# Patient Record
Sex: Female | Born: 1970 | Race: White | Hispanic: No | Marital: Married | Smoking: Former smoker
Health system: Southern US, Community
[De-identification: ages and names within clinical notes are randomized; demographics above are authoritative.]

## PROBLEM LIST (undated history)

## (undated) DIAGNOSIS — E785 Hyperlipidemia, unspecified: Secondary | ICD-10-CM

## (undated) DIAGNOSIS — C801 Malignant (primary) neoplasm, unspecified: Secondary | ICD-10-CM

## (undated) DIAGNOSIS — C50919 Malignant neoplasm of unspecified site of unspecified female breast: Secondary | ICD-10-CM

## (undated) DIAGNOSIS — R011 Cardiac murmur, unspecified: Secondary | ICD-10-CM

## (undated) DIAGNOSIS — I10 Essential (primary) hypertension: Secondary | ICD-10-CM

## (undated) DIAGNOSIS — E6609 Other obesity due to excess calories: Secondary | ICD-10-CM

## (undated) DIAGNOSIS — F419 Anxiety disorder, unspecified: Secondary | ICD-10-CM

## (undated) DIAGNOSIS — R7303 Prediabetes: Secondary | ICD-10-CM

## (undated) DIAGNOSIS — R079 Chest pain, unspecified: Secondary | ICD-10-CM

## (undated) DIAGNOSIS — Z5189 Encounter for other specified aftercare: Secondary | ICD-10-CM

## (undated) DIAGNOSIS — F32A Depression, unspecified: Secondary | ICD-10-CM

## (undated) HISTORY — DX: Chest pain, unspecified: R07.9

## (undated) HISTORY — PX: COLONOSCOPY: SHX174

## (undated) HISTORY — DX: Other obesity due to excess calories: E66.09

## (undated) HISTORY — DX: Malignant neoplasm of unspecified site of unspecified female breast: C50.919

## (undated) HISTORY — DX: Encounter for other specified aftercare: Z51.89

## (undated) HISTORY — DX: Depression, unspecified: F32.A

## (undated) HISTORY — DX: Hyperlipidemia, unspecified: E78.5

## (undated) HISTORY — DX: Cardiac murmur, unspecified: R01.1

## (undated) HISTORY — DX: Anxiety disorder, unspecified: F41.9

## (undated) HISTORY — PX: WISDOM TOOTH EXTRACTION: SHX21

## (undated) HISTORY — PX: PORT-A-CATH REMOVAL: SHX5289

## (undated) HISTORY — PX: STOMACH SURGERY: SHX791

## (undated) MED FILL — Pertuzumab Soln for IV Infusion 420 MG/14ML (30 MG/ML): INTRAVENOUS | Qty: 14 | Status: AC

## (undated) MED FILL — Dexamethasone Sodium Phosphate Inj 100 MG/10ML: INTRAMUSCULAR | Qty: 1 | Status: AC

## (undated) MED FILL — Fosaprepitant Dimeglumine For IV Infusion 150 MG (Base Eq): INTRAVENOUS | Qty: 5 | Status: AC

## (undated) MED FILL — Fam-Trastuzumab Deruxtecan-nxki For IV Soln 100 MG: INTRAVENOUS | Qty: 17 | Status: AC

## (undated) MED FILL — Iron Sucrose Inj 20 MG/ML (Fe Equiv): INTRAVENOUS | Qty: 10 | Status: AC

## (undated) MED FILL — Trastuzumab-dkst For IV Soln 150 MG: INTRAVENOUS | Qty: 22 | Status: AC

## (undated) MED FILL — Docetaxel Soln for IV Infusion 160 MG/16ML: INTRAVENOUS | Qty: 11 | Status: AC

---

## 2003-08-25 ENCOUNTER — Ambulatory Visit (HOSPITAL_COMMUNITY): Admission: RE | Admit: 2003-08-25 | Discharge: 2003-08-25 | Payer: Self-pay | Admitting: Family Medicine

## 2003-10-15 ENCOUNTER — Ambulatory Visit (HOSPITAL_COMMUNITY): Admission: RE | Admit: 2003-10-15 | Discharge: 2003-10-15 | Payer: Self-pay | Admitting: Family Medicine

## 2010-09-02 ENCOUNTER — Encounter: Payer: Self-pay | Admitting: Family Medicine

## 2010-09-02 ENCOUNTER — Encounter: Payer: Self-pay | Admitting: *Deleted

## 2012-08-13 HISTORY — PX: OTHER SURGICAL HISTORY: SHX169

## 2013-07-23 DIAGNOSIS — C50412 Malignant neoplasm of upper-outer quadrant of left female breast: Secondary | ICD-10-CM | POA: Insufficient documentation

## 2013-07-23 HISTORY — DX: Malignant neoplasm of upper-outer quadrant of left female breast: C50.412

## 2013-08-28 HISTORY — PX: PORTACATH PLACEMENT: SHX2246

## 2015-07-14 DIAGNOSIS — C50912 Malignant neoplasm of unspecified site of left female breast: Secondary | ICD-10-CM

## 2015-07-14 DIAGNOSIS — Z170 Estrogen receptor positive status [ER+]: Secondary | ICD-10-CM

## 2015-07-14 DIAGNOSIS — Z79810 Long term (current) use of selective estrogen receptor modulators (SERMs): Secondary | ICD-10-CM

## 2016-03-12 DIAGNOSIS — C50912 Malignant neoplasm of unspecified site of left female breast: Secondary | ICD-10-CM | POA: Diagnosis not present

## 2016-12-17 DIAGNOSIS — C50912 Malignant neoplasm of unspecified site of left female breast: Secondary | ICD-10-CM | POA: Diagnosis not present

## 2016-12-17 DIAGNOSIS — Z170 Estrogen receptor positive status [ER+]: Secondary | ICD-10-CM | POA: Diagnosis not present

## 2017-12-18 DIAGNOSIS — C50912 Malignant neoplasm of unspecified site of left female breast: Secondary | ICD-10-CM | POA: Diagnosis not present

## 2017-12-18 DIAGNOSIS — Z9221 Personal history of antineoplastic chemotherapy: Secondary | ICD-10-CM | POA: Diagnosis not present

## 2017-12-18 DIAGNOSIS — Z79810 Long term (current) use of selective estrogen receptor modulators (SERMs): Secondary | ICD-10-CM | POA: Diagnosis not present

## 2017-12-18 DIAGNOSIS — Z923 Personal history of irradiation: Secondary | ICD-10-CM | POA: Diagnosis not present

## 2017-12-18 DIAGNOSIS — Z170 Estrogen receptor positive status [ER+]: Secondary | ICD-10-CM | POA: Diagnosis not present

## 2017-12-18 DIAGNOSIS — R945 Abnormal results of liver function studies: Secondary | ICD-10-CM | POA: Diagnosis not present

## 2018-04-22 DIAGNOSIS — M7752 Other enthesopathy of left foot: Secondary | ICD-10-CM

## 2018-04-22 DIAGNOSIS — M6702 Short Achilles tendon (acquired), left ankle: Secondary | ICD-10-CM

## 2018-04-22 DIAGNOSIS — M7662 Achilles tendinitis, left leg: Secondary | ICD-10-CM

## 2018-04-22 HISTORY — DX: Other enthesopathy of left foot and ankle: M77.52

## 2018-04-22 HISTORY — DX: Achilles tendinitis, left leg: M76.62

## 2018-04-22 HISTORY — DX: Short Achilles tendon (acquired), left ankle: M67.02

## 2019-01-12 HISTORY — PX: LIVER BIOPSY: SHX301

## 2019-02-03 ENCOUNTER — Other Ambulatory Visit (HOSPITAL_COMMUNITY): Payer: Self-pay | Admitting: Nurse Practitioner

## 2019-02-03 DIAGNOSIS — R748 Abnormal levels of other serum enzymes: Secondary | ICD-10-CM

## 2019-02-03 DIAGNOSIS — K760 Fatty (change of) liver, not elsewhere classified: Secondary | ICD-10-CM

## 2019-02-04 DIAGNOSIS — Z170 Estrogen receptor positive status [ER+]: Secondary | ICD-10-CM | POA: Diagnosis not present

## 2019-02-04 DIAGNOSIS — C50912 Malignant neoplasm of unspecified site of left female breast: Secondary | ICD-10-CM | POA: Diagnosis not present

## 2019-02-11 ENCOUNTER — Other Ambulatory Visit: Payer: Self-pay | Admitting: Radiology

## 2019-02-12 ENCOUNTER — Other Ambulatory Visit: Payer: Self-pay | Admitting: Radiology

## 2019-02-12 ENCOUNTER — Other Ambulatory Visit: Payer: Self-pay | Admitting: Student

## 2019-02-16 ENCOUNTER — Ambulatory Visit (HOSPITAL_COMMUNITY)
Admission: RE | Admit: 2019-02-16 | Discharge: 2019-02-16 | Disposition: A | Discharge status: Home / Self Care | Payer: BC Managed Care – PPO | Source: Ambulatory Visit | Attending: Nurse Practitioner | Admitting: Nurse Practitioner

## 2019-02-16 ENCOUNTER — Encounter (HOSPITAL_COMMUNITY): Payer: Self-pay

## 2019-02-16 ENCOUNTER — Other Ambulatory Visit: Payer: Self-pay

## 2019-02-16 DIAGNOSIS — K760 Fatty (change of) liver, not elsewhere classified: Secondary | ICD-10-CM

## 2019-02-16 DIAGNOSIS — Z833 Family history of diabetes mellitus: Secondary | ICD-10-CM | POA: Diagnosis not present

## 2019-02-16 DIAGNOSIS — I10 Essential (primary) hypertension: Secondary | ICD-10-CM | POA: Insufficient documentation

## 2019-02-16 DIAGNOSIS — K7581 Nonalcoholic steatohepatitis (NASH): Secondary | ICD-10-CM | POA: Diagnosis not present

## 2019-02-16 DIAGNOSIS — Z853 Personal history of malignant neoplasm of breast: Secondary | ICD-10-CM | POA: Diagnosis not present

## 2019-02-16 DIAGNOSIS — Z87891 Personal history of nicotine dependence: Secondary | ICD-10-CM | POA: Diagnosis not present

## 2019-02-16 DIAGNOSIS — R7303 Prediabetes: Secondary | ICD-10-CM | POA: Insufficient documentation

## 2019-02-16 DIAGNOSIS — R748 Abnormal levels of other serum enzymes: Secondary | ICD-10-CM

## 2019-02-16 DIAGNOSIS — Z79899 Other long term (current) drug therapy: Secondary | ICD-10-CM | POA: Diagnosis not present

## 2019-02-16 DIAGNOSIS — R7989 Other specified abnormal findings of blood chemistry: Secondary | ICD-10-CM | POA: Diagnosis present

## 2019-02-16 HISTORY — DX: Malignant (primary) neoplasm, unspecified: C80.1

## 2019-02-16 HISTORY — DX: Essential (primary) hypertension: I10

## 2019-02-16 HISTORY — DX: Prediabetes: R73.03

## 2019-02-16 LAB — CBC
HCT: 43.1 % (ref 36.0–46.0)
Hemoglobin: 13.6 g/dL (ref 12.0–15.0)
MCH: 28.1 pg (ref 26.0–34.0)
MCHC: 31.6 g/dL (ref 30.0–36.0)
MCV: 89 fL (ref 80.0–100.0)
Platelets: 212 10*3/uL (ref 150–400)
RBC: 4.84 MIL/uL (ref 3.87–5.11)
RDW: 14.6 % (ref 11.5–15.5)
WBC: 8.2 10*3/uL (ref 4.0–10.5)
nRBC: 0 % (ref 0.0–0.2)

## 2019-02-16 LAB — PROTIME-INR
INR: 1.1 (ref 0.8–1.2)
Prothrombin Time: 13.8 seconds (ref 11.4–15.2)

## 2019-02-16 MED ORDER — SODIUM CHLORIDE 0.9 % IV SOLN
INTRAVENOUS | Status: AC | PRN
  Administered 2019-02-16: 10 mL/h via INTRAVENOUS

## 2019-02-16 MED ORDER — SODIUM CHLORIDE 0.9 % IV SOLN: INTRAVENOUS | Status: DC

## 2019-02-16 MED ORDER — FENTANYL CITRATE (PF) 100 MCG/2ML IJ SOLN
INTRAMUSCULAR | Status: AC | PRN
  Administered 2019-02-16: 25 ug via INTRAVENOUS
  Administered 2019-02-16: 50 ug via INTRAVENOUS

## 2019-02-16 MED ORDER — MIDAZOLAM HCL 2 MG/2ML IJ SOLN
INTRAMUSCULAR | Status: AC | PRN
  Administered 2019-02-16: 0.5 mg via INTRAVENOUS
  Administered 2019-02-16: 1 mg via INTRAVENOUS

## 2019-02-16 MED ORDER — OXYCODONE HCL 5 MG PO TABS: 5.0000 mg | ORAL_TABLET | ORAL | Status: DC | PRN

## 2019-02-16 MED ORDER — GELATIN ABSORBABLE 12-7 MM EX MISC
CUTANEOUS | Status: AC
  Filled 2019-02-16: qty 1

## 2019-02-16 MED ORDER — FENTANYL CITRATE (PF) 100 MCG/2ML IJ SOLN
INTRAMUSCULAR | Status: AC
  Filled 2019-02-16: qty 2

## 2019-02-16 MED ORDER — LIDOCAINE HCL (PF) 1 % IJ SOLN
INTRAMUSCULAR | Status: AC
  Filled 2019-02-16: qty 30

## 2019-02-16 MED ORDER — MIDAZOLAM HCL 2 MG/2ML IJ SOLN
INTRAMUSCULAR | Status: AC
  Filled 2019-02-16: qty 2

## 2019-02-16 NOTE — Discharge Instructions (Signed)
Liver Biopsy, Care After These instructions give you information about how to care for yourself after your procedure. Your health care provider may also give you more specific instructions. If you have problems or questions, contact your health care provider. What can I expect after the procedure? After your procedure, it is common to have:  Pain and soreness in the area where the biopsy was done.  Bruising around the area where the biopsy was done.  Sleepiness and fatigue for 1-2 days. Follow these instructions at home: Medicines  Take over-the-counter and prescription medicines only as told by your health care provider.  If you were prescribed an antibiotic medicine, take it as told by your health care provider. Do not stop taking the antibiotic even if you start to feel better.  Do not take medicines such as aspirin and ibuprofen unless your health care provider tells you to take them. These medicines thin your blood and can increase the risk of bleeding.  If you are taking prescription pain medicine, take actions to prevent or treat constipation. Your health care provider may recommend that you: ? Drink enough fluid to keep your urine pale yellow. ? Eat foods that are high in fiber, such as fresh fruits and vegetables, whole grains, and beans. ? Limit foods that are high in fat and processed sugars, such as fried or sweet foods. ? Take an over-the-counter or prescription medicine for constipation. Incision care  Follow instructions from your health care provider about how to take care of your incision. Make sure you: ? Wash your hands with soap and water before you change your bandage (dressing). If soap and water are not available, use hand sanitizer. ? Change your dressing as told by your health care provider. ? Leave stitches (sutures), skin glue, or adhesive strips in place. These skin closures may need to stay in place for 2 weeks or longer. If adhesive strip edges start to  loosen and curl up, you may trim the loose edges. Do not remove adhesive strips completely unless your health care provider tells you to do that.  Check your incision area every day for signs of infection. Check for: ? Redness, swelling, or pain. ? Fluid or blood. ? Warmth. ? Pus or a bad smell.  Do not take baths, swim, or use a hot tub until your health care provider says it is okay to do so. Activity   Rest at home for 1-2 days, or as directed by your health care provider. ? Avoid sitting for a long time without moving. Get up to take short walks every 1-2 hours. This is important to improve blood flow and breathing. Ask for help if you feel weak or unsteady.  Return to your normal activities as told by your health care provider. Ask your health care provider what activities are safe for you.  Do not drive or use heavy machinery while taking prescription pain medicine.  Do not lift anything that is heavier than 10 lb (4.5 kg), or the limit that your health care provider tells you, until he or she says that it is safe.  Do not play contact sports for 2 weeks after the procedure. General instructions   Do not drink alcohol in the first week after the procedure.  Have someone stay with you for at least 24 hours after the procedure.  It is your responsibility to obtain your test results. Ask your health care provider, or the department that is doing the test: ? When will my  results be ready? ? How will I get my results? ? What are my treatment options? ? What other tests do I need? ? What are my next steps?  Keep all follow-up visits as told by your health care provider. This is important. Contact a health care provider if:  You have increased bleeding from an incision, resulting in more than a small spot of blood.  You have redness, swelling, or increasing pain in any incisions.  You notice a discharge or a bad smell coming from any of your incisions.  You have a fever or  chills. Get help right away if:  You develop swelling, bloating, or pain in your abdomen.  You become dizzy or faint.  You develop a rash.  You have nausea or you vomit.  You faint, or you have shortness of breath or difficulty breathing.  You develop chest pain.  You have problems with your speech or vision.  You have trouble with your balance or moving your arms or legs. Summary  After the liver biopsy, it is common to have pain, soreness, and bruising in the area, as well as sleepiness and fatigue.  Take over-the-counter and prescription medicines only as told by your health care provider.  Follow instructions from your health care provider about how to care for your incision. Check the incision area daily for signs of infection. This information is not intended to replace advice given to you by your health care provider. Make sure you discuss any questions you have with your health care provider. Document Released: 02/16/2005 Document Revised: 09/22/2018 Document Reviewed: 08/09/2017 Elsevier Patient Education  [employer]. Moderate Conscious Sedation, Adult, Care After These instructions provide you with information about caring for yourself after your procedure. Your health care provider may also give you more specific instructions. Your treatment has been planned according to current medical practices, but problems sometimes occur. Call your health care provider if you have any problems or questions after your procedure. What can I expect after the procedure? After your procedure, it is common:  To feel sleepy for several hours.  To feel clumsy and have poor balance for several hours.  To have poor judgment for several hours.  To vomit if you eat too soon. Follow these instructions at home: For at least 24 hours after the procedure:   Do not: ? Participate in activities where you could fall or become injured. ? Drive. ? Use heavy machinery. ? Drink  alcohol. ? Take sleeping pills or medicines that cause drowsiness. ? Make important decisions or sign legal documents. ? Take care of children on your own.  Rest. Eating and drinking  Follow the diet recommended by your health care provider.  If you vomit: ? Drink water, juice, or soup when you can drink without vomiting. ? Make sure you have little or no nausea before eating solid foods. General instructions  Have a responsible adult stay with you until you are awake and alert.  Take over-the-counter and prescription medicines only as told by your health care provider.  If you smoke, do not smoke without supervision.  Keep all follow-up visits as told by your health care provider. This is important. Contact a health care provider if:  You keep feeling nauseous or you keep vomiting.  You feel light-headed.  You develop a rash.  You have a fever. Get help right away if:  You have trouble breathing. This information is not intended to replace advice given to you by your health  care provider. Make sure you discuss any questions you have with your health care provider. Document Released: 05/20/2013 Document Revised: 07/12/2017 Document Reviewed: 11/19/2015 Elsevier Patient Education  2020 [employer].

## 2019-02-16 NOTE — H&P (Signed)
Chief Complaint: Patient was seen in consultation today for random liver core biopsy at the request of Drazek,Dawn  Referring Physician(s): Drazek,Dawn  Supervising Physician: Markus Daft  Patient Status: [facility] - Out-pt  History of Present Illness: JENNELLE PINKSTAFF is a 48 y.o. female   Hx Breast Ca 2014 Follow up with Oncology revealed elevated liver functions in May 2020 Repeat labs still with elevation Now scheduled for liver core biopsy per Roosevelt Locks NP   Past Medical History:  Diagnosis Date  . Cancer ([place])   . Hypertension   . Pre-diabetes     Past Surgical History:  Procedure Laterality Date  . lumpectomy  2014    Allergies: Patient has no known allergies.  Medications: Prior to Admission medications   Medication Sig Start Date End Date Taking? Authorizing Provider  ARIPiprazole (ABILIFY) 10 MG tablet Take 5 mg by mouth daily.   Yes [provider]  desvenlafaxine (PRISTIQ) 50 MG 24 hr tablet Take 50 mg by mouth every morning.   Yes [provider]  ibuprofen (ADVIL) 200 MG tablet Take 400 mg by mouth every 8 (eight) hours as needed (pain).   Yes [provider]  labetalol (NORMODYNE) 100 MG tablet Take 100 mg by mouth every morning.   Yes [provider]  topiramate (TOPAMAX) 50 MG tablet Take 50 mg by mouth daily.   Yes [provider]  valsartan-hydrochlorothiazide (DIOVAN-HCT) 160-12.5 MG tablet Take 1 tablet by mouth daily.   Yes [provider]     Family History  Problem Relation Age of Onset  . Diabetes Father     Social History   Socioeconomic History  . Marital status: Married    Spouse name: Not on file  . Number of children: Not on file  . Years of education: Not on file  . Highest education level: Not on file  Occupational History  . Not on file  Social Needs  . Financial resource strain: Not on file  . Food insecurity    Worry: Not on file    Inability: Not on file  .  Transportation needs    Medical: Not on file    Non-medical: Not on file  Tobacco Use  . Smoking status: Former Smoker    Types: Cigarettes    Quit date: 2004    Years since quitting: 16.5  . Smokeless tobacco: Never Used  Substance and Sexual Activity  . Alcohol use: Never    Frequency: Never  . Drug use: Never  . Sexual activity: Not on file  Lifestyle  . Physical activity    Days per week: Not on file    Minutes per session: Not on file  . Stress: Not on file  Relationships  . Social Herbalist on phone: Not on file    Gets together: Not on file    Attends religious service: Not on file    Active member of club or organization: Not on file    Attends meetings of clubs or organizations: Not on file    Relationship status: Not on file  Other Topics Concern  . Not on file  Social History Narrative  . Not on file     Review of Systems: A 12 point ROS discussed and pertinent positives are indicated in the HPI above.  All other systems are negative.  Review of Systems  Constitutional: Negative for activity change, fatigue and fever.  Respiratory: Negative for cough and shortness of breath.  Cardiovascular: Negative for chest pain.  Gastrointestinal: Negative for abdominal pain.  Musculoskeletal: Negative for back pain.  Neurological: Negative for weakness.  Psychiatric/Behavioral: Negative for behavioral problems and confusion.    Vital Signs: BP 112/73   Pulse 93   Temp 98.2 F (36.8 C) (Oral)   Resp 16   Ht 5' 2"  (1.575 m)   Wt 226 lb (102.5 kg)   LMP 08/18/2013   SpO2 100%   BMI 41.34 kg/m   Physical Exam Vitals signs reviewed.  Cardiovascular:     Rate and Rhythm: Normal rate and regular rhythm.     Heart sounds: Normal heart sounds.  Pulmonary:     Effort: Pulmonary effort is normal.     Breath sounds: Normal breath sounds.  Abdominal:     General: Bowel sounds are normal.  Musculoskeletal: Normal range of motion.  Skin:    General:  Skin is warm and dry.  Neurological:     Mental Status: She is alert and oriented to person, place, and time.  Psychiatric:        Mood and Affect: Mood normal.        Behavior: Behavior normal.        Thought Content: Thought content normal.        Judgment: Judgment normal.     Imaging: No results found.  Labs:  CBC: Recent Labs    02/16/19 0640  WBC 8.2  HGB 13.6  HCT 43.1  PLT 212    COAGS: No results for input(s): INR, APTT in the last 8760 hours.  BMP: No results for input(s): NA, K, CL, CO2, GLUCOSE, BUN, CALCIUM, CREATININE, GFRNONAA, GFRAA in the last 8760 hours.  Invalid input(s): CMP  LIVER FUNCTION TESTS: No results for input(s): BILITOT, AST, ALT, ALKPHOS, PROT, ALBUMIN in the last 8760 hours.  TUMOR MARKERS: No results for input(s): AFPTM, CEA, CA199, CHROMGRNA in the last 8760 hours.  Assessment and Plan:  Persistent Elevated liver functions Hx Breast Ca Scheduled for random liver biopsy Risks and benefits of random liver biopsy was discussed with the patient and/or patient's family including, but not limited to bleeding, infection, damage to adjacent structures or low yield requiring additional tests.  All of the questions were answered and there is agreement to proceed. Consent signed and in chart.   Thank you for this interesting consult.  I greatly enjoyed meeting Kadisha Goodine Cicalese and look forward to participating in their care.  A copy of this report was sent to the requesting provider on this date.  Electronically Signed: Lavonia Drafts, PA-C 02/16/2019, 7:10 AM   I spent a total of  30 Minutes   in face to face in clinical consultation, greater than 50% of which was counseling/coordinating care for liver core bx

## 2019-02-16 NOTE — Sedation Documentation (Signed)
Called to give report. Nurse unavailable, will call back

## 2019-02-16 NOTE — Procedures (Signed)
Interventional Radiology Procedure:   Indications: Elevated LFTS  Procedure: US guided liver biopsy  Findings: Increased echogenicity and 3 cores from left hepatic lobe  Complications: None     EBL: Less than 20 ml  Plan: Bedrest 3 hours.    Alix Stowers R. Anselm Pancoast, MD  Pager: [phone]

## 2019-06-04 DIAGNOSIS — C50912 Malignant neoplasm of unspecified site of left female breast: Secondary | ICD-10-CM | POA: Diagnosis not present

## 2019-06-04 DIAGNOSIS — Z170 Estrogen receptor positive status [ER+]: Secondary | ICD-10-CM | POA: Diagnosis not present

## 2020-03-22 ENCOUNTER — Other Ambulatory Visit: Payer: Self-pay | Admitting: Nurse Practitioner

## 2020-03-22 DIAGNOSIS — K7581 Nonalcoholic steatohepatitis (NASH): Secondary | ICD-10-CM

## 2020-03-31 ENCOUNTER — Ambulatory Visit
Admission: RE | Admit: 2020-03-31 | Discharge: 2020-03-31 | Disposition: A | Discharge status: Home / Self Care | Payer: BC Managed Care – PPO | Source: Ambulatory Visit | Attending: Nurse Practitioner | Admitting: Nurse Practitioner

## 2020-03-31 DIAGNOSIS — K7581 Nonalcoholic steatohepatitis (NASH): Secondary | ICD-10-CM

## 2020-06-01 ENCOUNTER — Other Ambulatory Visit: Payer: Self-pay | Admitting: Hematology and Oncology

## 2020-06-01 DIAGNOSIS — C170 Malignant neoplasm of duodenum: Secondary | ICD-10-CM | POA: Diagnosis not present

## 2020-06-01 DIAGNOSIS — C50412 Malignant neoplasm of upper-outer quadrant of left female breast: Secondary | ICD-10-CM | POA: Diagnosis not present

## 2020-06-01 LAB — BASIC METABOLIC PANEL
BUN: 19 (ref 4–21)
CO2: 25 — AB (ref 13–22)
Chloride: 103 (ref 99–108)
Creatinine: 1 (ref 0.5–1.1)
Glucose: 105
Potassium: 3.7 (ref 3.4–5.3)
Sodium: 139 (ref 137–147)

## 2020-06-01 LAB — CBC AND DIFFERENTIAL
HCT: 45 (ref 36–46)
Hemoglobin: 14.2 (ref 12.0–16.0)
Neutrophils Absolute: 6.96
Platelets: 244 (ref 150–399)
WBC: 11.6

## 2020-06-01 LAB — HEPATIC FUNCTION PANEL
ALT: 42 — AB (ref 7–35)
AST: 37 — AB (ref 13–35)
Alkaline Phosphatase: 111 (ref 25–125)
Bilirubin, Total: 0.5

## 2020-06-01 LAB — CBC: RBC: 5.46 — AB (ref 3.87–5.11)

## 2020-06-01 LAB — COMPREHENSIVE METABOLIC PANEL
Albumin: 4.6 (ref 3.5–5.0)
Calcium: 9.5 (ref 8.7–10.7)

## 2020-06-13 ENCOUNTER — Telehealth: Payer: Self-pay

## 2020-06-13 NOTE — Telephone Encounter (Signed)
Patient notified of the results  

## 2020-06-13 NOTE — Telephone Encounter (Signed)
Error. Belva Chimes, LPN

## 2020-06-13 NOTE — Telephone Encounter (Signed)
Pt notified- verbalized understanding.

## 2020-06-13 NOTE — Telephone Encounter (Signed)
-----   Message from Derwood Kaplan, MD sent at 06/13/2020  2:34 PM EDT ----- Regarding: call with results Tell her MRI shows large cyst but looks completely benign

## 2020-06-15 ENCOUNTER — Encounter: Payer: Self-pay | Admitting: Nurse Practitioner

## 2020-07-04 ENCOUNTER — Encounter: Payer: Self-pay | Admitting: Oncology

## 2020-07-04 DIAGNOSIS — Z780 Asymptomatic menopausal state: Secondary | ICD-10-CM | POA: Insufficient documentation

## 2020-07-04 DIAGNOSIS — M8580 Other specified disorders of bone density and structure, unspecified site: Secondary | ICD-10-CM

## 2020-07-04 DIAGNOSIS — K760 Fatty (change of) liver, not elsewhere classified: Secondary | ICD-10-CM

## 2020-07-04 DIAGNOSIS — N281 Cyst of kidney, acquired: Secondary | ICD-10-CM

## 2020-07-04 HISTORY — DX: Cyst of kidney, acquired: N28.1

## 2020-07-04 HISTORY — DX: Other specified disorders of bone density and structure, unspecified site: M85.80

## 2020-07-04 HISTORY — DX: Asymptomatic menopausal state: Z78.0

## 2020-07-04 HISTORY — DX: Fatty (change of) liver, not elsewhere classified: K76.0

## 2020-07-28 ENCOUNTER — Encounter: Payer: Self-pay | Admitting: Oncology

## 2020-10-25 ENCOUNTER — Encounter: Payer: Self-pay | Admitting: Hematology and Oncology

## 2020-10-25 ENCOUNTER — Other Ambulatory Visit: Payer: Self-pay | Admitting: Hematology and Oncology

## 2020-10-25 DIAGNOSIS — Z1231 Encounter for screening mammogram for malignant neoplasm of breast: Secondary | ICD-10-CM

## 2020-11-23 ENCOUNTER — Other Ambulatory Visit: Payer: Self-pay

## 2020-11-23 ENCOUNTER — Telehealth: Payer: Self-pay | Admitting: Hematology and Oncology

## 2020-11-23 ENCOUNTER — Inpatient Hospital Stay: Payer: BC Managed Care – PPO | Admitting: Hematology and Oncology

## 2020-11-23 ENCOUNTER — Encounter: Payer: Self-pay | Admitting: Hematology and Oncology

## 2020-11-23 ENCOUNTER — Inpatient Hospital Stay: Payer: BC Managed Care – PPO | Attending: Hematology and Oncology

## 2020-11-23 VITALS — BP 133/83 | HR 107 | Temp 98.5°F | Resp 20 | Ht 62.0 in | Wt 209.7 lb

## 2020-11-23 DIAGNOSIS — Z170 Estrogen receptor positive status [ER+]: Secondary | ICD-10-CM | POA: Diagnosis not present

## 2020-11-23 DIAGNOSIS — C50412 Malignant neoplasm of upper-outer quadrant of left female breast: Secondary | ICD-10-CM | POA: Diagnosis not present

## 2020-11-23 DIAGNOSIS — M7989 Other specified soft tissue disorders: Secondary | ICD-10-CM | POA: Diagnosis not present

## 2020-11-23 LAB — HEPATIC FUNCTION PANEL
ALT: 34 (ref 7–35)
AST: 29 (ref 13–35)
Alkaline Phosphatase: 91 (ref 25–125)
Bilirubin, Total: 0.4

## 2020-11-23 LAB — CBC AND DIFFERENTIAL
HCT: 43 (ref 36–46)
Hemoglobin: 13.7 (ref 12.0–16.0)
Neutrophils Absolute: 6.11
Platelets: 257 (ref 150–399)
WBC: 11.1

## 2020-11-23 LAB — COMPREHENSIVE METABOLIC PANEL
Albumin: 4.4 (ref 3.5–5.0)
Calcium: 8.9 (ref 8.7–10.7)

## 2020-11-23 LAB — BASIC METABOLIC PANEL
BUN: 17 (ref 4–21)
CO2: 24 — AB (ref 13–22)
Chloride: 108 (ref 99–108)
Creatinine: 0.8 (ref 0.5–1.1)
Glucose: 115
Potassium: 3.7 (ref 3.4–5.3)
Sodium: 141 (ref 137–147)

## 2020-11-23 LAB — CBC: RBC: 5.08 (ref 3.87–5.11)

## 2020-11-23 NOTE — Progress Notes (Signed)
[facility]  [address] [place],  [place]  [address] [phone]  Clinic Day:  11/23/2020  Referring physician: Stein, William J, PA-C   CHIEF COMPLAINT:  CC:  Stage II B hormone receptor positive breast cancer  Current Treatment:   Anastrozole 1 mg daily   HISTORY OF PRESENT ILLNESS:  Anna [name] is a 49 y.o. female with a stage IIB (T2 N1a M0) hormone receptor positive left breast cancer diagnosed in December 2014.  She was treated with lumpectomy.  Pathology revealed a 2.1 cm, grade 3, invasive ductal carcinoma with 1 lymph node positive for metastasis.  Lymphovascular invasion was seen.  Estrogen and progesterone receptors were positive and her 2 Neu negative.  Ki 67 was 76%.  She received adjuvant chemotherapy with 4 cycles of doxorubicin and cyclophosphamide, followed by 12 weeks of weekly paclitaxel.  She then received adjuvant radiation to the left breast.  She was placed on tamoxifen 20 mg daily in October 2015.  She underwent testing for hereditary cancer syndromes with [employer] myRisk Hereditary Cancer panel, which did not reveal any clinically significant mutation or variants of uncertain significance, so no change in management was recommended.  She was found to have fatty infiltration of the liver on a CTA chest in June 2015.  Bone density scan in November 2017 was normal.  Bilateral diagnostic mammogram in November 2018 did not reveal any evidence of malignancy.  When she was seen in May 2019, she had mild elevation of the AST, which was felt to likely be due to medication effects. She had a bilateral diagnostic mammogram in January 2020.  At that time, she reported a lump in the left breast, so an ultrasound was also done.  Bilateral mammogram and left breast ultrasound did not reveal any suspicious findings.  Postsurgical scarring was seen in the left breast.  The area of palpable abnormality in the left breast was overlying the  postsurgical scarring.  She was rescheduled in our office in May 2020 and found to have significant increase in her liver transaminases.  Tamoxifen was discontinued.  She was instructed to avoid alcohol and Tylenol.  CT abdomen and pelvis revealed severe hepatic steatosis.  Bone density scan in June 2020 revealed mild osteopenia with a T-score -1.4 in the spine.  Her last menstruation was in January 2015 and labs confirmed her postmenopausal status.  She was placed on anastrozole 1 mg daily in June 2020.  She was also referred to hepatology and saw Dawn Drazek, NP, who diagnosed the patient with nonalcoholic fatty liver disease.  She was unsure if this was secondary to tamoxifen, but felt it was unlikely.  Liver biopsy in July 2020 confirmed severe steatosis of 75-80% with moderate ballooning degeneration.  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  Ms. Drazek plans to monitor the patient every 6 months and will likely repeat a liver biopsy in 3 years.  The patient has had improvement in the liver transaminases.  Abdominal ultrasound per Dr. Drazek which revealed an enlarging exophytic cyst of the right kidney.  This has been appreciated for many years, on PET imaging from 2014 this measured 10.6 cm, CT in May 2020 this was 12.4 cm, and recent abdominal ultrasound from July measured this at 14.5 cm. MRI done in November confirmed a benign cyst of the right kidney.  She continues to see Dr. Drazek every 6 months.  She has   also been seen by dermatology at [facility], with a good report.   INTERVAL HISTORY:  Anna [name] is here today for repeat clinical assessment. She states she continues anastrozole daily without significant difficulty.  She has noted swelling of her left arm and pain in her left upper chest wall for several months.  She denies any changes in the breasts. She denies fevers or chills. She denies pain elsewhere. Her appetite is good. Her weight has  been stable.  She is not scheduled to have her bilateral mammogram until next week.  She has never had a colonoscopy, so we refer her to GI for screening colonoscopy.  REVIEW OF SYSTEMS:  Review of Systems  Constitutional: Negative for appetite change, chills, fatigue, fever and unexpected weight change.  HENT:   Negative for lump/mass, mouth sores and sore throat.   Respiratory: Negative for cough and shortness of breath.   Cardiovascular: Negative for chest pain and leg swelling.  Gastrointestinal: Negative for abdominal pain, constipation, diarrhea, nausea and vomiting.  Endocrine: Negative for hot flashes.  Genitourinary: Negative for difficulty urinating, dysuria, frequency, hematuria and vaginal bleeding.   Musculoskeletal: Negative for arthralgias, back pain and myalgias.  Skin: Negative for rash.  Neurological: Negative for dizziness and headaches.  Hematological: Negative for adenopathy. Does not bruise/bleed easily.  Psychiatric/Behavioral: Negative for depression and sleep disturbance. The patient is not nervous/anxious.      VITALS:  Blood pressure 133/83, pulse (!) 107, temperature 98.5 F (36.9 C), temperature source Oral, resp. rate 20, height 5' 2" (1.575 m), weight 209 lb 11.2 oz (95.1 kg), last menstrual period 08/18/2013, SpO2 94 %.  Wt Readings from Last 3 Encounters:  11/23/20 209 lb 11.2 oz (95.1 kg)  02/16/19 226 lb (102.5 kg)    Body mass index is 38.35 kg/m.  Performance status (ECOG): 1 - Symptomatic but completely ambulatory  PHYSICAL EXAM:  Physical Exam Vitals and nursing note reviewed.  Constitutional:      General: She is not in acute distress.    Appearance: Normal appearance.  HENT:     Head: Normocephalic and atraumatic.     Mouth/Throat:     Mouth: Mucous membranes are moist.     Pharynx: Oropharynx is clear. No oropharyngeal exudate or posterior oropharyngeal erythema.  Eyes:     General: No scleral icterus.    Extraocular Movements:  Extraocular movements intact.     Conjunctiva/sclera: Conjunctivae normal.     Pupils: Pupils are equal, round, and reactive to light.  Cardiovascular:     Rate and Rhythm: Normal rate and regular rhythm.     Heart sounds: Normal heart sounds. No murmur heard. No friction rub. No gallop.   Pulmonary:     Effort: Pulmonary effort is normal.     Breath sounds: Normal breath sounds. No wheezing, rhonchi or rales.  Chest:     Chest wall: Swelling (Left upper chest wall) present. No tenderness.  Breasts:     Right: Normal. No swelling, bleeding, inverted nipple, mass, nipple discharge, skin change, tenderness, axillary adenopathy or supraclavicular adenopathy.     Left: Normal. No swelling, bleeding, inverted nipple, mass, nipple discharge, skin change, tenderness, axillary adenopathy or supraclavicular adenopathy.    Abdominal:     General: There is no distension.     Palpations: Abdomen is soft. There is no hepatomegaly, splenomegaly or mass.     Tenderness: There is no abdominal tenderness.  Musculoskeletal:        General: Swelling (Left upper extremity) present. Normal  range of motion.     Cervical back: Normal range of motion and neck supple. No tenderness.     Right lower leg: No edema.     Left lower leg: No edema.  Lymphadenopathy:     Cervical: No cervical adenopathy.     Upper Body:     Right upper body: No supraclavicular or axillary adenopathy.     Left upper body: No supraclavicular or axillary adenopathy.     Lower Body: No right inguinal adenopathy. No left inguinal adenopathy.  Skin:    General: Skin is warm and dry.     Coloration: Skin is not jaundiced.     Findings: No rash.  Neurological:     Mental Status: She is alert and oriented to person, place, and time.     Cranial Nerves: No cranial nerve deficit.  Psychiatric:        Mood and Affect: Mood normal.        Behavior: Behavior normal.        Thought Content: Thought content normal.    LABS:   CBC  Latest Ref Rng & Units 11/23/2020 06/01/2020 02/16/2019  WBC - 11.1 11.6 8.2  Hemoglobin 12.0 - 16.0 13.7 14.2 13.6  Hematocrit 36 - 46 43 45 43.1  Platelets 150 - 399 257 244 212   CMP Latest Ref Rng & Units 11/23/2020 06/01/2020  BUN 4 - 21 17 19  Creatinine 0.5 - 1.1 0.8 1.0  Sodium 137 - 147 141 139  Potassium 3.4 - 5.3 3.7 3.7  Chloride 99 - 108 108 103  CO2 13 - 22 24(A) 25(A)  Calcium 8.7 - 10.7 8.9 9.5  Alkaline Phos 25 - 125 91 111  AST 13 - 35 29 37(A)  ALT 7 - 35 34 42(A)     No results found for: CEA1 / No results found for: CEA1 No results found for: PSA1 No results found for: CAN199 No results found for: CAN125  No results found for: TOTALPROTELP, ALBUMINELP, A1GS, A2GS, BETS, BETA2SER, GAMS, MSPIKE, SPEI No results found for: TIBC, FERRITIN, IRONPCTSAT No results found for: LDH  STUDIES:  No results found.    HISTORY:   Past Medical History:  Diagnosis Date  . Cancer (HCC)   . Hypertension   . Pre-diabetes     Past Surgical History:  Procedure Laterality Date  . lumpectomy  2014    Family History  Problem Relation Age of Onset  . Diabetes Father     Social History:  reports that she quit smoking about 18 years ago. Her smoking use included cigarettes. She has never used smokeless tobacco. She reports that she does not drink alcohol and does not use drugs.The patient is alone today.  Allergies: No Known Allergies  Current Medications: Current Outpatient Medications  Medication Sig Dispense Refill  . ARIPiprazole (ABILIFY) 10 MG tablet Take 5 mg by mouth daily.    . desvenlafaxine (PRISTIQ) 50 MG 24 hr tablet Take 50 mg by mouth every morning.    . ibuprofen (ADVIL) 200 MG tablet Take 400 mg by mouth every 8 (eight) hours as needed (pain).    . labetalol (NORMODYNE) 100 MG tablet Take 100 mg by mouth every morning.    . topiramate (TOPAMAX) 50 MG tablet Take 50 mg by mouth daily.    . valsartan-hydrochlorothiazide (DIOVAN-HCT) 160-12.5 MG tablet  Take 1 tablet by mouth daily.     No current facility-administered medications for this visit.     ASSESSMENT &   PLAN:   Assessment:  1. History of stage IIB hormone receptor positive breast cancer treated with surgery, chemotherapy and radiation therapy. She remains without evidence of recurrence over 6 years postop.  She was on adjuvant hormonal therapy with tamoxifen 20 mg daily, but due to elevation of the liver transaminases was switched to anastrazole.  2. Severe hepatic steatosis with elevation of the liver transaminases, nearly normalized now.  She continues to follow with hepatology, Dr. Drazek every 6 months who has recommended annual ultrasounds of the liver. 3. Borderline osteopenia of the spine.  She is on high-dose vitamin-D, but does not take calcium.  I advised her to take calcium daily.  She will be due for her next bone density scan in June 2022. 4. Exophytic cyst, present for many years which had slightly enlarged.   MRI confirmed this to be a benign cyst.  5. New lymphedema of the left upper extremity and left chest wall likely benign, but I would like to obtain a CT of the chest for further evaluation.  First, I will obtain a stat ultrasound of the left upper extremity to evaluate for deep venous thrombosis.   Plan:   The patient will proceed with mammogram next week. I will obtain a stat Venous Doppler ultrasound of the left upper extremity to evaluate for deep venous thrombosis.  If this is negative, I will proceed with a CT chest to further evaluate this swelling of the chest wall and plan to contact her with the results. If this is due to lymphedema, we will have her see the lymphedema specialist.  I will refer her to GI for screening colonoscopy.  We will plan to see her back in 6 months with a CBC and comprehensive metabolic panel for continued supportive care. The patient understands the plans discussed today and is in agreement with them.  She knows to contact our office  if she develops concerns prior to her next appointment.      A , PA-C       

## 2020-11-23 NOTE — Telephone Encounter (Signed)
Per 4/13 Staff Msg, patient scheduled for *STAT* VD U/S Left Upper Ext for today at 4:00 pm to check in at 3:30 pm  Gave patient orders

## 2020-11-28 ENCOUNTER — Telehealth: Payer: Self-pay

## 2020-11-28 NOTE — Telephone Encounter (Signed)
Pt has appt with Dr. Harriet Masson on 12/09/20. Faxed office notes to [place] on 11/28/20. Referral sent to scheduling.

## 2020-11-29 ENCOUNTER — Encounter: Payer: Self-pay | Admitting: *Deleted

## 2020-11-29 ENCOUNTER — Encounter: Payer: Self-pay | Admitting: Cardiology

## 2020-11-29 DIAGNOSIS — J4520 Mild intermittent asthma, uncomplicated: Secondary | ICD-10-CM

## 2020-11-29 DIAGNOSIS — F329 Major depressive disorder, single episode, unspecified: Secondary | ICD-10-CM | POA: Insufficient documentation

## 2020-11-29 DIAGNOSIS — F319 Bipolar disorder, unspecified: Secondary | ICD-10-CM

## 2020-11-29 DIAGNOSIS — Z0000 Encounter for general adult medical examination without abnormal findings: Secondary | ICD-10-CM | POA: Insufficient documentation

## 2020-11-29 DIAGNOSIS — D518 Other vitamin B12 deficiency anemias: Secondary | ICD-10-CM | POA: Insufficient documentation

## 2020-11-29 DIAGNOSIS — R5382 Chronic fatigue, unspecified: Secondary | ICD-10-CM | POA: Insufficient documentation

## 2020-11-29 DIAGNOSIS — F330 Major depressive disorder, recurrent, mild: Secondary | ICD-10-CM

## 2020-11-29 DIAGNOSIS — E039 Hypothyroidism, unspecified: Secondary | ICD-10-CM | POA: Insufficient documentation

## 2020-11-29 DIAGNOSIS — E559 Vitamin D deficiency, unspecified: Secondary | ICD-10-CM

## 2020-11-29 DIAGNOSIS — G4700 Insomnia, unspecified: Secondary | ICD-10-CM

## 2020-11-29 DIAGNOSIS — J019 Acute sinusitis, unspecified: Secondary | ICD-10-CM

## 2020-11-29 DIAGNOSIS — J302 Other seasonal allergic rhinitis: Secondary | ICD-10-CM

## 2020-11-29 DIAGNOSIS — K273 Acute peptic ulcer, site unspecified, without hemorrhage or perforation: Secondary | ICD-10-CM

## 2020-11-29 DIAGNOSIS — I10 Essential (primary) hypertension: Secondary | ICD-10-CM

## 2020-11-29 DIAGNOSIS — G9332 Myalgic encephalomyelitis/chronic fatigue syndrome: Secondary | ICD-10-CM

## 2020-11-29 DIAGNOSIS — E785 Hyperlipidemia, unspecified: Secondary | ICD-10-CM | POA: Insufficient documentation

## 2020-11-29 DIAGNOSIS — Z79899 Other long term (current) drug therapy: Secondary | ICD-10-CM

## 2020-11-29 DIAGNOSIS — E782 Mixed hyperlipidemia: Secondary | ICD-10-CM

## 2020-11-29 DIAGNOSIS — E119 Type 2 diabetes mellitus without complications: Secondary | ICD-10-CM

## 2020-11-29 DIAGNOSIS — G43909 Migraine, unspecified, not intractable, without status migrainosus: Secondary | ICD-10-CM | POA: Insufficient documentation

## 2020-11-29 DIAGNOSIS — Z853 Personal history of malignant neoplasm of breast: Secondary | ICD-10-CM

## 2020-11-29 HISTORY — DX: Migraine, unspecified, not intractable, without status migrainosus: G43.909

## 2020-11-29 HISTORY — DX: Personal history of malignant neoplasm of breast: Z85.3

## 2020-11-29 HISTORY — DX: Hypothyroidism, unspecified: E03.9

## 2020-11-29 HISTORY — DX: Acute sinusitis, unspecified: J01.90

## 2020-11-29 HISTORY — DX: Vitamin D deficiency, unspecified: E55.9

## 2020-11-29 HISTORY — DX: Mixed hyperlipidemia: E78.2

## 2020-11-29 HISTORY — DX: Bipolar disorder, unspecified: F31.9

## 2020-11-29 HISTORY — DX: Major depressive disorder, recurrent, mild: F33.0

## 2020-11-29 HISTORY — DX: Myalgic encephalomyelitis/chronic fatigue syndrome: G93.32

## 2020-11-29 HISTORY — DX: Other vitamin B12 deficiency anemias: D51.8

## 2020-11-29 HISTORY — DX: Insomnia, unspecified: G47.00

## 2020-11-29 HISTORY — DX: Acute peptic ulcer, site unspecified, without hemorrhage or perforation: K27.3

## 2020-11-29 HISTORY — DX: Essential (primary) hypertension: I10

## 2020-11-29 HISTORY — DX: Other seasonal allergic rhinitis: J30.2

## 2020-11-29 HISTORY — DX: Other long term (current) drug therapy: Z79.899

## 2020-11-29 HISTORY — DX: Type 2 diabetes mellitus without complications: E11.9

## 2020-11-29 HISTORY — DX: Chronic fatigue, unspecified: R53.82

## 2020-11-29 HISTORY — DX: Mild intermittent asthma, uncomplicated: J45.20

## 2020-11-30 ENCOUNTER — Other Ambulatory Visit: Payer: Self-pay | Admitting: Hematology and Oncology

## 2020-11-30 ENCOUNTER — Telehealth: Payer: Self-pay | Admitting: Hematology and Oncology

## 2020-11-30 DIAGNOSIS — R0789 Other chest pain: Secondary | ICD-10-CM

## 2020-11-30 DIAGNOSIS — Z170 Estrogen receptor positive status [ER+]: Secondary | ICD-10-CM

## 2020-11-30 NOTE — Progress Notes (Unsigned)
t

## 2020-11-30 NOTE — Telephone Encounter (Signed)
Per 4/20 Staff Msg, faxed order for X-Ray Chest to [facility] - Patient has Mammo tomorrow.  Requested X-Ray Chest to be added to Appt

## 2020-12-06 ENCOUNTER — Telehealth: Payer: Self-pay

## 2020-12-06 NOTE — Telephone Encounter (Signed)
-----   Message from [name], PA-C sent at 12/06/2020 10:35 AM EDT ----- Please tell her mammo was negative. I thought she was getting a CXR the same day. Is she going to do that soon? Thanks!

## 2020-12-08 ENCOUNTER — Telehealth: Payer: Self-pay

## 2020-12-08 NOTE — Telephone Encounter (Signed)
-----   Message from [name], PA-C sent at 12/06/2020 10:35 AM EDT ----- Please tell her mammo was negative. I thought she was getting a CXR the same day. Is she going to do that soon? Thanks!

## 2020-12-09 ENCOUNTER — Other Ambulatory Visit: Payer: Self-pay

## 2020-12-09 ENCOUNTER — Encounter: Payer: Self-pay | Admitting: Cardiology

## 2020-12-09 ENCOUNTER — Ambulatory Visit: Payer: BC Managed Care – PPO | Admitting: Cardiology

## 2020-12-09 ENCOUNTER — Telehealth: Payer: Self-pay

## 2020-12-09 VITALS — BP 138/80 | HR 119 | Ht 63.0 in | Wt 211.2 lb

## 2020-12-09 DIAGNOSIS — E119 Type 2 diabetes mellitus without complications: Secondary | ICD-10-CM

## 2020-12-09 DIAGNOSIS — R0789 Other chest pain: Secondary | ICD-10-CM | POA: Diagnosis not present

## 2020-12-09 DIAGNOSIS — E669 Obesity, unspecified: Secondary | ICD-10-CM

## 2020-12-09 DIAGNOSIS — R9431 Abnormal electrocardiogram [ECG] [EKG]: Secondary | ICD-10-CM | POA: Insufficient documentation

## 2020-12-09 DIAGNOSIS — R0602 Shortness of breath: Secondary | ICD-10-CM | POA: Diagnosis not present

## 2020-12-09 DIAGNOSIS — R000 Tachycardia, unspecified: Secondary | ICD-10-CM

## 2020-12-09 DIAGNOSIS — R011 Cardiac murmur, unspecified: Secondary | ICD-10-CM | POA: Insufficient documentation

## 2020-12-09 DIAGNOSIS — I10 Essential (primary) hypertension: Secondary | ICD-10-CM

## 2020-12-09 NOTE — Progress Notes (Signed)
Cardiology Office Note:    Date:  12/09/2020   ID:  Niki Payment Bolt, DOB 1971/05/31, MRN [id number]  PCP:  Bonnita Nasuti, MD  Cardiologist:  No primary care provider on file.  Electrophysiologist:  None   Referring MD: Laverle Hobby, NP   I was having shortness of breath and chest pain.  History of Present Illness:    Anna [name] is a 50 y.o. 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  The patient tells me that over the last several months she has been experiencing intermittent chest pain.  She notes that it is intermittent.  When it comes on is sometimes a burning sensation sometimes a stabbing sensation rated.  It last for few minutes and then resolved.  Nothing makes it better or worse.  She could be driving when she experienced this or she could be working when she experiences.  Over the last few weeks was been noticeable with the fact that she has been having shortness of breath on exertion.  She was able to do a lot of activities but recently she cannot without significant shortness of breath.  No other complaints at this time.  Past Medical History:  Diagnosis Date  . Achilles tendinitis of left lower extremity 04/22/2018  . Acute peptic ulcer without hemorrhage and without perforation 11/29/2020  . Acute sinusitis 11/29/2020  . Bipolar disorder ([place]) 11/29/2020  . Breast cancer ([place])   . Cancer ([place])   . Cardiac murmur   . Chest pain   . Chronic fatigue syndrome 11/29/2020  . Depression   . Essential hypertension 11/29/2020  . Hepatic steatosis determined by biopsy of liver 07/04/2020  . HLD (hyperlipidemia)   . Hypertension   . Hypothyroidism 11/29/2020  . Insomnia 11/29/2020  . Malignant neoplasm of upper-outer quadrant of left female breast ([place]) 07/23/2013  . Migraine 11/29/2020  .  Mild intermittent asthma 11/29/2020  . Mild recurrent major depression ([place]) 11/29/2020  . Mixed hyperlipidemia 11/29/2020  . Obesity due to excess calories   . Osteopenia after menopause 07/04/2020  . Other long term (current) drug therapy 11/29/2020  . Other vitamin B12 deficiency anemias 11/29/2020  . Personal history of malignant neoplasm of breast 11/29/2020  . Pre-diabetes   . Renal cyst, acquired, right 07/04/2020   14.5 cm in October 2021  . Retrocalcaneal bursitis (back of heel), left 04/22/2018  . Seasonal allergic rhinitis 11/29/2020  . Tightness of heel cord, left 04/22/2018  . Type 2 diabetes mellitus without complications ([place]) 0/10/4915  . Vitamin D deficiency 11/29/2020    Past Surgical History:  Procedure Laterality Date  . CESAREAN SECTION    . LIVER BIOPSY  01/2019  . lumpectomy  2014   Left Breast  . PORTACATH PLACEMENT  08/28/2013  . STOMACH SURGERY     Tummy Tuck    Current Medications: Current Meds  Medication Sig  . albuterol (VENTOLIN HFA) 108 (90 Base) MCG/ACT inhaler Inhale 2 puffs into the lungs every 6 (six) hours as needed for shortness of breath.  . ARIPiprazole (ABILIFY) 5 MG tablet Take 2.5 mg by mouth daily.  Marland Kitchen atorvastatin (LIPITOR) 20 MG tablet Take 1 tablet by mouth daily.  Marland Kitchen desvenlafaxine (PRISTIQ) 50 MG 24 hr tablet Take 1 tablet by mouth daily.  Marland Kitchen FARXIGA 10 MG TABS tablet Take 10 mg  by mouth daily.  . fluticasone (FLONASE) 50 MCG/ACT nasal spray Place 1 spray into both nostrils daily.  Marland Kitchen labetalol (NORMODYNE) 200 MG tablet Take 200 mg by mouth 2 (two) times daily.  . meloxicam (MOBIC) 15 MG tablet Take 1 tablet by mouth daily.  . nitroGLYCERIN (NITROSTAT) 0.4 MG SL tablet Place 0.4 mg under the tongue every 5 (five) minutes as needed for chest pain.  Marland Kitchen ondansetron (ZOFRAN) 4 MG tablet Take 4 mg by mouth as needed for nausea.  Marland Kitchen OZEMPIC, 0.25 OR 0.5 MG/DOSE, 2 MG/1.5ML SOPN Inject 0.5 mg into the skin once a week.  . topiramate (TOPAMAX) 50 MG  tablet Take 50 mg by mouth daily.  . Vitamin D, Ergocalciferol, (DRISDOL) 1.25 MG (50000 UNIT) CAPS capsule Take 50,000 Units by mouth 2 (two) times a week.  . [DISCONTINUED] phentermine (ADIPEX-P) 37.5 MG tablet Take 37.5 mg by mouth daily. Take 1.5 tablets daily     Allergies:   Patient has no known allergies.   Social History   Socioeconomic History  . Marital status: Married    Spouse name: Not on file  . Number of children: Not on file  . Years of education: Not on file  . Highest education level: Not on file  Occupational History  . Not on file  Tobacco Use  . Smoking status: Former Smoker    Types: Cigarettes    Quit date: 2004    Years since quitting: 18.3  . Smokeless tobacco: Never Used  Vaping Use  . Vaping Use: Never used  Substance and Sexual Activity  . Alcohol use: Never  . Drug use: Never  . Sexual activity: Not on file  Other Topics Concern  . Not on file  Social History Narrative  . Not on file   Social Determinants of Health   Financial Resource Strain: Not on file  Food Insecurity: Not on file  Transportation Needs: Not on file  Physical Activity: Not on file  Stress: Not on file  Social Connections: Not on file     Family History: The patient's family history includes Diabetes in her father; Hyperlipidemia in her mother; Stroke in her father.  ROS:   Review of Systems  Constitution: Negative for decreased appetite, fever and weight gain.  HENT: Negative for congestion, ear discharge, hoarse voice and sore throat.   Eyes: Negative for discharge, redness, vision loss in right eye and visual halos.  Cardiovascular: Negative for chest pain, dyspnea on exertion, leg swelling, orthopnea and palpitations.  Respiratory: Negative for cough, hemoptysis, shortness of breath and snoring.   Endocrine: Negative for heat intolerance and polyphagia.  Hematologic/Lymphatic: Negative for bleeding problem. Does not bruise/bleed easily.  Skin: Negative for  flushing, nail changes, rash and suspicious lesions.  Musculoskeletal: Negative for arthritis, joint pain, muscle cramps, myalgias, neck pain and stiffness.  Gastrointestinal: Negative for abdominal pain, bowel incontinence, diarrhea and excessive appetite.  Genitourinary: Negative for decreased libido, genital sores and incomplete emptying.  Neurological: Negative for brief paralysis, focal weakness, headaches and loss of balance.  Psychiatric/Behavioral: Negative for altered mental status, depression and suicidal ideas.  Allergic/Immunologic: Negative for HIV exposure and persistent infections.    EKGs/Labs/Other Studies Reviewed:    The following studies were reviewed today:   EKG:  The ekg ordered today demonstrates sinus tachycardia, heart rate 119 bpm.  Recent Labs: 11/23/2020: ALT 34; BUN 17; Creatinine 0.8; Hemoglobin 13.7; Platelets 257; Potassium 3.7; Sodium 141  Recent Lipid Panel No results found for: CHOL, TRIG,  HDL, CHOLHDL, VLDL, LDLCALC, LDLDIRECT  Physical Exam:    VS:  BP 138/80   Pulse (!) 119   Ht 5' 3"  (1.6 m)   Wt 211 lb 3.2 oz (95.8 kg)   LMP 08/18/2013   SpO2 96%   BMI 37.41 kg/m     Wt Readings from Last 3 Encounters:  12/09/20 211 lb 3.2 oz (95.8 kg)  11/24/20 209 lb (94.8 kg)  11/23/20 209 lb 11.2 oz (95.1 kg)     GEN: Well nourished, well developed in no acute distress HEENT: Normal NECK: No JVD; No carotid bruits LYMPHATICS: No lymphadenopathy CARDIAC: S1S2 noted,RRR, 2/6 systolic murmurs, rubs, gallops RESPIRATORY:  Clear to auscultation without rales, wheezing or rhonchi  ABDOMEN: Soft, non-tender, non-distended, +bowel sounds, no guarding. EXTREMITIES: No edema, No cyanosis, no clubbing MUSCULOSKELETAL:  No deformity  SKIN: Warm and dry NEUROLOGIC:  Alert and oriented x 3, non-focal PSYCHIATRIC:  Normal affect, good insight  ASSESSMENT:    1. SOB (shortness of breath)   2. Other chest pain   3. Murmur, cardiac   4. Type 2 diabetes  mellitus without complication, without long-term current use of insulin ([place])   5. Hypertension, unspecified type   6. Sinus tachycardia   7. Abnormal EKG   8. Obesity (BMI 30-39.9)    PLAN:     The symptoms chest pain and shortness of breath along with her diabetes and hypertension history are concerning, this patient does have intermediate risk for coronary artery disease and at this time I would like to pursue an ischemic evaluation in this patient.  Shared decision a coronary CTA at this time is appropriate.  I have discussed with the patient about the testing.  The patient has no IV contrast allergy and is agreeable to proceed with this test.  She has been given nitroglycerin by her PCP.  She has been prescribed labetalol but the patient does not take this medication as prescribed.  I have advised the patient today to please take this medicine as prescribed because we will like to understand her heart rate as well limited today to give her any recommendation on her heart rate and her beta-blocker as I do not understand how low her heart rate goes when she takes her labetalol.  In terms of her shortness of breath and murmur on exam, echocardiogram will also be done to assess LV/RV function and any other structure abnormalities.    Her blood pressure slightly elevated in the office today.  Talk to the patient given her diabetes she should really be less than 130/80.  She will take her labetalol as prescribed.  This is being managed by his primary care doctor.  No adjustments for antidiabetic medications were made today.  The patient understands the need to lose weight with diet and exercise. We have discussed specific strategies for this.  The patient is in agreement with the above plan. The patient left the office in stable condition.  The patient will follow up in 3 months   Medication Adjustments/Labs and Tests Ordered: Current medicines are reviewed at length with the patient today.   Concerns regarding medicines are outlined above.  Orders Placed This Encounter  Procedures  . CT CORONARY MORPH W/CTA COR W/SCORE W/CA W/CM &/OR WO/CM  . Basic metabolic panel  . Magnesium  . EKG 12-Lead  . ECHOCARDIOGRAM COMPLETE   No orders of the defined types were placed in this encounter.   Patient Instructions   Medication Instructions:  Your physician  recommends that you continue on your current medications as directed. Please refer to the Current Medication list given to you today. For the next few weeks please take Labetalol twice daily as prescribed and log your blood pressure and heart rate.  *If you need a refill on your cardiac medications before your next appointment, please call your pharmacy*   Lab Work: Your physician recommends that you return for lab work: 3-7 days before CT Scan: BMET, Mag If you have labs (blood work) drawn today and your tests are completely normal, you will receive your results only by: Marland Kitchen MyChart Message (if you have MyChart) OR . A paper copy in the mail If you have any lab test that is abnormal or we need to change your treatment, we will call you to review the results.   Testing/Procedures: Your cardiac CT will be scheduled:   [facility] [address] [place], [place] [address] [phone]  If scheduled at [facility], please arrive at the [facility] main entrance (entrance A) of [facility] 30 minutes prior to test start time. Proceed to the [facility] Radiology Department (first floor) to check-in and test prep.  Please follow these instructions carefully (unless otherwise directed):    On the Night Before the Test: . Be sure to Drink plenty of water. . Do not consume any caffeinated/decaffeinated beverages or chocolate 12 hours prior to your test. . Do not take any antihistamines 12 hours prior to your test.   On the Day of the Test: . Drink plenty of water until 1 hour prior to the  test. . Do not eat any food 4 hours prior to the test. . You may take your regular medications prior to the test.  . FEMALES- please wear underwire-free bra if available      After the Test: . Drink plenty of water. . After receiving IV contrast, you may experience a mild flushed feeling. This is normal. . On occasion, you may experience a mild rash up to 24 hours after the test. This is not dangerous. If this occurs, you can take Benadryl 25 mg and increase your fluid intake. . If you experience trouble breathing, this can be serious. If it is severe call 911 IMMEDIATELY. If it is mild, please call our office.   Once we have confirmed authorization from your insurance company, we will call you to set up a date and time for your test. Based on how quickly your insurance processes prior authorizations requests, please allow up to 4 weeks to be contacted for scheduling your Cardiac CT appointment. Be advised that routine Cardiac CT appointments could be scheduled as many as 8 weeks after your provider has ordered it.  For non-scheduling related questions, please contact the cardiac imaging nurse navigator should you have any questions/concerns: Marchia Bond, Cardiac Imaging Nurse Navigator Gordy Clement, Cardiac Imaging Nurse Navigator [facility] Direct Office Dial: [phone]   For scheduling needs, including cancellations and rescheduling, please call [place], [phone].   Your physician has requested that you have an echocardiogram. Echocardiography is a painless test that uses sound waves to create images of your heart. It provides your doctor with information about the size and shape of your heart and how well your heart's chambers and valves are working. This procedure takes approximately one hour. There are no restrictions for this procedure.  Follow-Up: At [facility], you and your health needs are our priority.  As part of  our continuing mission  to provide you with exceptional heart care, we have created designated Provider Care Teams.  These Care Teams include your primary Cardiologist (physician) and Advanced Practice Providers (APPs -  Physician Assistants and Nurse Practitioners) who all work together to provide you with the care you need, when you need it.  We recommend signing up for the patient portal called "MyChart".  Sign up information is provided on this After Visit Summary.  MyChart is used to connect with patients for Virtual Visits (Telemedicine).  Patients are able to view lab/test results, encounter notes, upcoming appointments, etc.  Non-urgent messages can be sent to your provider as well.   To learn more about what you can do with MyChart, go to [email or web address].    Your next appointment:   3 month(s)  The format for your next appointment:   In Person  Provider:   Berniece Salines, DO   Other Instructions  Echocardiogram An echocardiogram is a test that uses sound waves (ultrasound) to produce images of the heart. Images from an echocardiogram can provide important information about:  Heart size and shape.  The size and thickness and movement of your heart's walls.  Heart muscle function and strength.  Heart valve function or if you have stenosis. Stenosis is when the heart valves are too narrow.  If blood is flowing backward through the heart valves (regurgitation).  A tumor or infectious growth around the heart valves.  Areas of heart muscle that are not working well because of poor blood flow or injury from a heart attack.  Aneurysm detection. An aneurysm is a weak or damaged part of an artery wall. The wall bulges out from the normal force of blood pumping through the body. Tell a health care provider about:  Any allergies you have.  All medicines you are taking, including vitamins, herbs, eye drops, creams, and over-the-counter medicines.  Any blood disorders you have.  Any surgeries you  have had.  Any medical conditions you have.  Whether you are pregnant or may be pregnant. What are the risks? Generally, this is a safe test. However, problems may occur, including an allergic reaction to dye (contrast) that may be used during the test. What happens before the test? No specific preparation is needed. You may eat and drink normally. What happens during the test?  You will take off your clothes from the waist up and put on a hospital gown.  Electrodes or electrocardiogram (ECG)patches may be placed on your chest. The electrodes or patches are then connected to a device that monitors your heart rate and rhythm.  You will lie down on a table for an ultrasound exam. A gel will be applied to your chest to help sound waves pass through your skin.  A handheld device, called a transducer, will be pressed against your chest and moved over your heart. The transducer produces sound waves that travel to your heart and bounce back (or "echo" back) to the transducer. These sound waves will be captured in real-time and changed into images of your heart that can be viewed on a video monitor. The images will be recorded on a computer and reviewed by your health care provider.  You may be asked to change positions or hold your breath for a short time. This makes it easier to get different views or better views of your heart.  In some cases, you may receive contrast through an IV in one of your veins. This can improve the quality  of the pictures from your heart. The procedure may vary among health care providers and hospitals.   What can I expect after the test? You may return to your normal, everyday life, including diet, activities, and medicines, unless your health care provider tells you not to do that. Follow these instructions at home:  It is up to you to get the results of your test. Ask your health care provider, or the department that is doing the test, when your results will be  ready.  Keep all follow-up visits. This is important. Summary  An echocardiogram is a test that uses sound waves (ultrasound) to produce images of the heart.  Images from an echocardiogram can provide important information about the size and shape of your heart, heart muscle function, heart valve function, and other possible heart problems.  You do not need to do anything to prepare before this test. You may eat and drink normally.  After the echocardiogram is completed, you may return to your normal, everyday life, unless your health care provider tells you not to do that. This information is not intended to replace advice given to you by your health care provider. Make sure you discuss any questions you have with your health care provider. Document Revised: 03/22/2020 Document Reviewed: 03/22/2020 Elsevier Patient Education  2021 [place].      Adopting a Healthy Lifestyle.  Know what a healthy weight is for you (roughly BMI <25) and aim to maintain this   Aim for 7+ servings of fruits and vegetables daily   65-80+ fluid ounces of water or unsweet tea for healthy kidneys   Limit to max 1 drink of alcohol per day; avoid smoking/tobacco   Limit animal fats in diet for cholesterol and heart health - choose grass fed whenever available   Avoid highly processed foods, and foods high in saturated/trans fats   Aim for low stress - take time to unwind and care for your mental health   Aim for 150 min of moderate intensity exercise weekly for heart health, and weights twice weekly for bone health   Aim for 7-9 hours of sleep daily   When it comes to diets, agreement about the perfect plan isnt easy to find, even among the experts. Experts at the [employer] developed an idea known as the Healthy Eating Plate. Just imagine a plate divided into logical, healthy portions.   The emphasis is on diet quality:   Load up on vegetables and fruits - one-half of  your plate: Aim for color and variety, and remember that potatoes dont count.   Go for whole grains - one-quarter of your plate: Whole wheat, barley, wheat berries, quinoa, oats, brown rice, and foods made with them. If you want pasta, go with whole wheat pasta.   Protein power - one-quarter of your plate: Fish, chicken, beans, and nuts are all healthy, versatile protein sources. Limit red meat.   The diet, however, does go beyond the plate, offering a few other suggestions.   Use healthy plant oils, such as olive, canola, soy, corn, sunflower and peanut. Check the labels, and avoid partially hydrogenated oil, which have unhealthy trans fats.   If youre thirsty, drink water. Coffee and tea are good in moderation, but skip sugary drinks and limit milk and dairy products to one or two daily servings.   The type of carbohydrate in the diet is more important than the amount. Some sources of carbohydrates, such as vegetables, fruits, whole  grains, and beans-are healthier than others.   Finally, stay active  Signed, Berniece Salines, DO  12/09/2020 4:25 PM    [facility]

## 2020-12-09 NOTE — Patient Instructions (Addendum)
Medication Instructions:  Your physician recommends that you continue on your current medications as directed. Please refer to the Current Medication list given to you today. For the next few weeks please take Labetalol twice daily as prescribed and log your blood pressure and heart rate.  *If you need a refill on your cardiac medications before your next appointment, please call your pharmacy*   Lab Work: Your physician recommends that you return for lab work: 3-7 days before CT Scan: BMET, Mag If you have labs (blood work) drawn today and your tests are completely normal, you will receive your results only by: Marland Kitchen MyChart Message (if you have MyChart) OR . A paper copy in the mail If you have any lab test that is abnormal or we need to change your treatment, we will call you to review the results.   Testing/Procedures: Your cardiac CT will be scheduled:   [employer] [address] [place], [place] [address] [phone]  If scheduled at [facility], please arrive at the [facility] main entrance (entrance A) of [facility] 30 minutes prior to test start time. Proceed to the [facility] Radiology Department (first floor) to check-in and test prep.  Please follow these instructions carefully (unless otherwise directed):    On the Night Before the Test: . Be sure to Drink plenty of water. . Do not consume any caffeinated/decaffeinated beverages or chocolate 12 hours prior to your test. . Do not take any antihistamines 12 hours prior to your test.   On the Day of the Test: . Drink plenty of water until 1 hour prior to the test. . Do not eat any food 4 hours prior to the test. . You may take your regular medications prior to the test.  . FEMALES- please wear underwire-free bra if available      After the Test: . Drink plenty of water. . After receiving IV contrast, you may experience a mild flushed feeling. This is normal. . On occasion, you may  experience a mild rash up to 24 hours after the test. This is not dangerous. If this occurs, you can take Benadryl 25 mg and increase your fluid intake. . If you experience trouble breathing, this can be serious. If it is severe call 911 IMMEDIATELY. If it is mild, please call our office.   Once we have confirmed authorization from your insurance company, we will call you to set up a date and time for your test. Based on how quickly your insurance processes prior authorizations requests, please allow up to 4 weeks to be contacted for scheduling your Cardiac CT appointment. Be advised that routine Cardiac CT appointments could be scheduled as many as 8 weeks after your provider has ordered it.  For non-scheduling related questions, please contact the cardiac imaging nurse navigator should you have any questions/concerns: Marchia Bond, Cardiac Imaging Nurse Navigator Gordy Clement, Cardiac Imaging Nurse Navigator [facility] Direct Office Dial: [phone]   For scheduling needs, including cancellations and rescheduling, please call [place], [phone].   Your physician has requested that you have an echocardiogram. Echocardiography is a painless test that uses sound waves to create images of your heart. It provides your doctor with information about the size and shape of your heart and how well your heart's chambers and valves are working. This procedure takes approximately one hour. There are no restrictions for this procedure.  Follow-Up: At [facility], you and your health needs are our  priority.  As part of our continuing mission to provide you with exceptional heart care, we have created designated Provider Care Teams.  These Care Teams include your primary Cardiologist (physician) and Advanced Practice Providers (APPs -  Physician Assistants and Nurse Practitioners) who all work together to provide you with the care you need, when you need it.  We recommend  signing up for the patient portal called "MyChart".  Sign up information is provided on this After Visit Summary.  MyChart is used to connect with patients for Virtual Visits (Telemedicine).  Patients are able to view lab/test results, encounter notes, upcoming appointments, etc.  Non-urgent messages can be sent to your provider as well.   To learn more about what you can do with MyChart, go to [email or web address].    Your next appointment:   3 month(s)  The format for your next appointment:   In Person  Provider:   Berniece Salines, DO   Other Instructions  Echocardiogram An echocardiogram is a test that uses sound waves (ultrasound) to produce images of the heart. Images from an echocardiogram can provide important information about:  Heart size and shape.  The size and thickness and movement of your heart's walls.  Heart muscle function and strength.  Heart valve function or if you have stenosis. Stenosis is when the heart valves are too narrow.  If blood is flowing backward through the heart valves (regurgitation).  A tumor or infectious growth around the heart valves.  Areas of heart muscle that are not working well because of poor blood flow or injury from a heart attack.  Aneurysm detection. An aneurysm is a weak or damaged part of an artery wall. The wall bulges out from the normal force of blood pumping through the body. Tell a health care provider about:  Any allergies you have.  All medicines you are taking, including vitamins, herbs, eye drops, creams, and over-the-counter medicines.  Any blood disorders you have.  Any surgeries you have had.  Any medical conditions you have.  Whether you are pregnant or may be pregnant. What are the risks? Generally, this is a safe test. However, problems may occur, including an allergic reaction to dye (contrast) that may be used during the test. What happens before the test? No specific preparation is needed. You may  eat and drink normally. What happens during the test?  You will take off your clothes from the waist up and put on a hospital gown.  Electrodes or electrocardiogram (ECG)patches may be placed on your chest. The electrodes or patches are then connected to a device that monitors your heart rate and rhythm.  You will lie down on a table for an ultrasound exam. A gel will be applied to your chest to help sound waves pass through your skin.  A handheld device, called a transducer, will be pressed against your chest and moved over your heart. The transducer produces sound waves that travel to your heart and bounce back (or "echo" back) to the transducer. These sound waves will be captured in real-time and changed into images of your heart that can be viewed on a video monitor. The images will be recorded on a computer and reviewed by your health care provider.  You may be asked to change positions or hold your breath for a short time. This makes it easier to get different views or better views of your heart.  In some cases, you may receive contrast through an IV in one of your veins.  This can improve the quality of the pictures from your heart. The procedure may vary among health care providers and hospitals.   What can I expect after the test? You may return to your normal, everyday life, including diet, activities, and medicines, unless your health care provider tells you not to do that. Follow these instructions at home:  It is up to you to get the results of your test. Ask your health care provider, or the department that is doing the test, when your results will be ready.  Keep all follow-up visits. This is important. Summary  An echocardiogram is a test that uses sound waves (ultrasound) to produce images of the heart.  Images from an echocardiogram can provide important information about the size and shape of your heart, heart muscle function, heart valve function, and other possible heart  problems.  You do not need to do anything to prepare before this test. You may eat and drink normally.  After the echocardiogram is completed, you may return to your normal, everyday life, unless your health care provider tells you not to do that. This information is not intended to replace advice given to you by your health care provider. Make sure you discuss any questions you have with your health care provider. Document Revised: 03/22/2020 Document Reviewed: 03/22/2020 Elsevier Patient Education  2021 [employer].

## 2020-12-09 NOTE — Telephone Encounter (Signed)
-----   Message from [name], PA-C sent at 12/06/2020 10:35 AM EDT ----- Please tell her mammo was negative. I thought she was getting a CXR the same day. Is she going to do that soon? Thanks!

## 2020-12-12 ENCOUNTER — Telehealth: Payer: Self-pay

## 2020-12-12 ENCOUNTER — Encounter: Payer: Self-pay | Admitting: Hematology and Oncology

## 2020-12-12 NOTE — Telephone Encounter (Signed)
Patient notified

## 2020-12-12 NOTE — Telephone Encounter (Signed)
-----   Message from [name], PA-C sent at 12/06/2020 10:35 AM EDT ----- Please tell her mammo was negative. I thought she was getting a CXR the same day. Is she going to do that soon? Thanks!

## 2020-12-15 ENCOUNTER — Other Ambulatory Visit: Payer: Self-pay

## 2020-12-15 ENCOUNTER — Telehealth (HOSPITAL_COMMUNITY): Payer: Self-pay | Admitting: Emergency Medicine

## 2020-12-15 ENCOUNTER — Encounter: Payer: Self-pay | Admitting: Hematology and Oncology

## 2020-12-15 DIAGNOSIS — C50412 Malignant neoplasm of upper-outer quadrant of left female breast: Secondary | ICD-10-CM

## 2020-12-15 DIAGNOSIS — Z170 Estrogen receptor positive status [ER+]: Secondary | ICD-10-CM

## 2020-12-15 DIAGNOSIS — R0789 Other chest pain: Secondary | ICD-10-CM

## 2020-12-15 NOTE — Telephone Encounter (Signed)
Attempted to call patient regarding upcoming cardiac CT appointment. °Left message on voicemail with name and callback number °Luvada Salamone RN Navigator Cardiac Imaging °[facility] °[phone] Office °[phone] Cell ° °

## 2020-12-16 ENCOUNTER — Telehealth: Payer: Self-pay | Admitting: Pharmacist

## 2020-12-16 ENCOUNTER — Telehealth (HOSPITAL_COMMUNITY): Payer: Self-pay | Admitting: *Deleted

## 2020-12-16 DIAGNOSIS — R000 Tachycardia, unspecified: Secondary | ICD-10-CM

## 2020-12-16 LAB — MAGNESIUM: Magnesium: 2.2 mg/dL (ref 1.6–2.3)

## 2020-12-16 LAB — BASIC METABOLIC PANEL
BUN/Creatinine Ratio: 23 (ref 9–23)
BUN: 18 mg/dL (ref 6–24)
CO2: 20 mmol/L (ref 20–29)
Calcium: 9.6 mg/dL (ref 8.7–10.2)
Chloride: 106 mmol/L (ref 96–106)
Creatinine, Ser: 0.8 mg/dL (ref 0.57–1.00)
Glucose: 101 mg/dL — ABNORMAL HIGH (ref 65–99)
Potassium: 4.2 mmol/L (ref 3.5–5.2)
Sodium: 142 mmol/L (ref 134–144)
eGFR: 90 mL/min/{1.73_m2} (ref >59)

## 2020-12-16 MED ORDER — IVABRADINE HCL 5 MG PO TABS: ORAL_TABLET | ORAL | 0 refills | Status: DC

## 2020-12-16 NOTE — Telephone Encounter (Signed)
Pt returning call regarding upcoming cardiac imaging study; pt verbalizes understanding of appt date/time, parking situation and where to check in, pre-test NPO status and medications ordered, and verified current allergies; name and call back number provided for further questions should they arise  Anna Clement RN Navigator Cardiac [place] [facility] [phone] office [phone] cell.  After discussion with pt, she reports her HR was 95bpm this morning despite taking her morning dose of 26m labetolol.  Discussed with Dr. THarriet Massonand pt is to take 2076mlabetolol and 1073mvabradine 2 hours prior to cardiac CT scan.  Pt reports difficulty in obtaining ivabradine from her pharmacy.  Therefore a sample of [facility]  [facility] of need for sample.  Pt verbalize instructions of where and when to pick up ivabradine sample.

## 2020-12-16 NOTE — Telephone Encounter (Signed)
Per Dr. Harriet Masson, two Corlanor 36m tablets placed up front for patient for cardiac CT

## 2020-12-19 ENCOUNTER — Ambulatory Visit (HOSPITAL_COMMUNITY)
Admission: RE | Admit: 2020-12-19 | Discharge: 2020-12-19 | Disposition: A | Discharge status: Home / Self Care | Payer: BC Managed Care – PPO | Source: Ambulatory Visit | Attending: Cardiology | Admitting: Cardiology

## 2020-12-19 ENCOUNTER — Other Ambulatory Visit: Payer: Self-pay

## 2020-12-19 DIAGNOSIS — R0789 Other chest pain: Secondary | ICD-10-CM

## 2020-12-19 DIAGNOSIS — Z006 Encounter for examination for normal comparison and control in clinical research program: Secondary | ICD-10-CM

## 2020-12-19 NOTE — Research (Signed)
IDENTIFY Informed Consent                  Subject Name: Anna [name]   Subject met inclusion and exclusion criteria.  The informed consent form, study requirements and expectations were reviewed with the subject and questions and concerns were addressed prior to the signing of the consent form.  The subject verbalized understanding of the trial requirements.  The subject agreed to participate in the IDENTIFY trial and signed the informed consent at 16:04PM on 12/19/20.  The informed consent was obtained prior to performance of any protocol-specific procedures for the subject.  A copy of the signed informed consent was given to the subject and a copy was placed in the subject's medical record.   Meade Maw , Naval architect

## 2020-12-19 NOTE — Progress Notes (Signed)
Patient arrived and reported she took her medications before hand, however patient's heart rate on arrival is 83 and doesn't brady with breath hold. Talked with nurse navigator and she would like to cancel the patients test for today and talk with MD about future testing options. Discharging patient, nothing further needed at this time.

## 2021-01-05 ENCOUNTER — Other Ambulatory Visit: Payer: Self-pay

## 2021-01-05 ENCOUNTER — Ambulatory Visit (INDEPENDENT_AMBULATORY_CARE_PROVIDER_SITE_OTHER): Payer: BC Managed Care – PPO

## 2021-01-05 DIAGNOSIS — R0789 Other chest pain: Secondary | ICD-10-CM

## 2021-01-05 DIAGNOSIS — R0602 Shortness of breath: Secondary | ICD-10-CM | POA: Diagnosis not present

## 2021-01-05 DIAGNOSIS — R011 Cardiac murmur, unspecified: Secondary | ICD-10-CM

## 2021-01-05 LAB — ECHOCARDIOGRAM COMPLETE
Area-P 1/2: 4.74 cm2
S' Lateral: 2.3 cm

## 2021-01-11 ENCOUNTER — Telehealth: Payer: Self-pay

## 2021-01-11 ENCOUNTER — Telehealth: Payer: Self-pay | Admitting: Cardiology

## 2021-01-11 NOTE — Telephone Encounter (Signed)
Left message for patient to return our call.

## 2021-01-11 NOTE — Telephone Encounter (Signed)
    Pt is calling to get echo result

## 2021-01-16 ENCOUNTER — Telehealth: Payer: Self-pay

## 2021-01-16 NOTE — Telephone Encounter (Signed)
Spoke with patient, see chart.

## 2021-01-16 NOTE — Telephone Encounter (Signed)
Left message for patient to return our call.

## 2021-01-16 NOTE — Telephone Encounter (Signed)
Patient returning call.

## 2021-01-24 ENCOUNTER — Other Ambulatory Visit: Payer: Self-pay

## 2021-01-25 ENCOUNTER — Other Ambulatory Visit: Payer: Self-pay

## 2021-01-25 DIAGNOSIS — R079 Chest pain, unspecified: Secondary | ICD-10-CM

## 2021-01-25 DIAGNOSIS — R9431 Abnormal electrocardiogram [ECG] [EKG]: Secondary | ICD-10-CM

## 2021-01-25 DIAGNOSIS — R0602 Shortness of breath: Secondary | ICD-10-CM

## 2021-02-15 ENCOUNTER — Ambulatory Visit (AMBULATORY_SURGERY_CENTER): Payer: BC Managed Care – PPO

## 2021-02-15 ENCOUNTER — Other Ambulatory Visit: Payer: Self-pay

## 2021-02-15 VITALS — Ht 63.0 in | Wt 195.8 lb

## 2021-02-15 DIAGNOSIS — Z1211 Encounter for screening for malignant neoplasm of colon: Secondary | ICD-10-CM

## 2021-02-15 MED ORDER — CLENPIQ 10-3.5-12 MG-GM -GM/160ML PO SOLN: 1.0000 | ORAL | 0 refills | Status: DC

## 2021-02-15 NOTE — Progress Notes (Signed)

## 2021-02-21 NOTE — Addendum Note (Signed)
Addended by: Berniece Salines on: 02/21/2021 02:42 PM   Modules accepted: Orders

## 2021-02-22 ENCOUNTER — Encounter: Payer: Self-pay | Admitting: Cardiology

## 2021-02-28 ENCOUNTER — Telehealth (HOSPITAL_COMMUNITY): Payer: Self-pay | Admitting: *Deleted

## 2021-02-28 NOTE — Telephone Encounter (Signed)
Left message on voicemail per DPR in reference to upcoming appointment scheduled on 03/07/21 with detailed instructions given per Myocardial Perfusion Study Information Sheet for the test. LM to arrive 15 minutes early, and that it is imperative to arrive on time for appointment to keep from having the test rescheduled. If you need to cancel or reschedule your appointment, please call the office within 24 hours of your appointment. Failure to do so may result in a cancellation of your appointment, and a $50 no show fee. Phone number given for call back for any questions. Kirstie Peri

## 2021-03-01 ENCOUNTER — Ambulatory Visit (AMBULATORY_SURGERY_CENTER): Payer: BC Managed Care – PPO | Admitting: Gastroenterology

## 2021-03-01 ENCOUNTER — Other Ambulatory Visit: Payer: Self-pay

## 2021-03-01 ENCOUNTER — Encounter: Payer: Self-pay | Admitting: Gastroenterology

## 2021-03-01 VITALS — BP 117/65 | HR 87 | Temp 98.2°F | Resp 18 | Ht 63.0 in | Wt 195.8 lb

## 2021-03-01 DIAGNOSIS — Z1211 Encounter for screening for malignant neoplasm of colon: Secondary | ICD-10-CM | POA: Diagnosis present

## 2021-03-01 DIAGNOSIS — D127 Benign neoplasm of rectosigmoid junction: Secondary | ICD-10-CM

## 2021-03-01 MED ORDER — SODIUM CHLORIDE 0.9 % IV SOLN: 500.0000 mL | Freq: Once | INTRAVENOUS | Status: DC

## 2021-03-01 NOTE — Patient Instructions (Addendum)
Handouts were given to your care partner on polyps, diverticulosis, and hemorrhoids. NO ASPIRIN, ASPIRIN CONTAINING PRODUCTS (BC OR GOODY POWDERS) OR NSAIDS (IBUPROFEN, ADVIL, ALEVE, AND MOTRIN) FOR 5 days; TYLENOL IS OK TO TAKE. Your sugar was 111 in the recovery room.  You may resume your other current medications today. Await biopsy results.  May take 1-3 weeks to receive pathology results. Please call if any questions or concerns.     YOU HAD AN ENDOSCOPIC PROCEDURE TODAY AT [facility]:   Refer to the procedure report that was given to you for any specific questions about what was found during the examination.  If the procedure report does not answer your questions, please call your gastroenterologist to clarify.  If you requested that your care partner not be given the details of your procedure findings, then the procedure report has been included in a sealed envelope for you to review at your convenience later.  YOU SHOULD EXPECT: Some feelings of bloating in the abdomen. Passage of more gas than usual.  Walking can help get rid of the air that was put into your GI tract during the procedure and reduce the bloating. If you had a lower endoscopy (such as a colonoscopy or flexible sigmoidoscopy) you may notice spotting of blood in your stool or on the toilet paper. If you underwent a bowel prep for your procedure, you may not have a normal bowel movement for a few days.  Please Note:  You might notice some irritation and congestion in your nose or some drainage.  This is from the oxygen used during your procedure.  There is no need for concern and it should clear up in a day or so.  SYMPTOMS TO REPORT IMMEDIATELY:  Following lower endoscopy (colonoscopy or flexible sigmoidoscopy):  Excessive amounts of blood in the stool  Significant tenderness or worsening of abdominal pains  Swelling of the abdomen that is new, acute  Fever of 100F or higher   For urgent or emergent  issues, a gastroenterologist can be reached at any hour by calling [phone]. Do not use MyChart messaging for urgent concerns.    DIET:  We do recommend a small meal at first, but then you may proceed to your regular diet.  Drink plenty of fluids but you should avoid alcoholic beverages for 24 hours.  ACTIVITY:  You should plan to take it easy for the rest of today and you should NOT DRIVE or use heavy machinery until tomorrow (because of the sedation medicines used during the test).    FOLLOW UP: Our staff will call the number listed on your records 48-72 hours following your procedure to check on you and address any questions or concerns that you may have regarding the information given to you following your procedure. If we do not reach you, we will leave a message.  We will attempt to reach you two times.  During this call, we will ask if you have developed any symptoms of COVID 19. If you develop any symptoms (ie: fever, flu-like symptoms, shortness of breath, cough etc.) before then, please call [phone].  If you test positive for Covid 19 in the 2 weeks post procedure, please call and report this information to [place].    If any biopsies were taken you will be contacted by phone or by letter within the next 1-3 weeks.  Please call us at [phone] if you have not heard about the biopsies in 3 weeks.  SIGNATURES/CONFIDENTIALITY: You and/or your care partner have signed paperwork which will be entered into your electronic medical record.  These signatures attest to the fact that that the information above on your After Visit Summary has been reviewed and is understood.  Full responsibility of the confidentiality of this discharge information lies with you and/or your care-partner.

## 2021-03-01 NOTE — Progress Notes (Signed)
Called to room to assist during endoscopic procedure.  Patient ID and intended procedure confirmed with present staff. Received instructions for my participation in the procedure from the performing physician.  

## 2021-03-01 NOTE — Op Note (Signed)
[place] Patient Name: Anna [name] Procedure Date: 03/01/2021 10:34 AM MRN: [id number] Endoscopist: Jackquline Denmark , MD Age: 50 Referring MD:  Date of Birth: [birth year] Gender: Female Account #: [id number] Procedure:                Colonoscopy Indications:              Screening for colorectal malignant neoplasm Medicines:                Monitored Anesthesia Care Procedure:                Pre-Anesthesia Assessment:                           - Prior to the procedure, a History and Physical                            was performed, and patient medications and                            allergies were reviewed. The patient's tolerance of                            previous anesthesia was also reviewed. The risks                            and benefits of the procedure and the sedation                            options and risks were discussed with the patient.                            All questions were answered, and informed consent                            was obtained. Prior Anticoagulants: The patient has                            taken no previous anticoagulant or antiplatelet                            agents. ASA Grade Assessment: II - A patient with                            mild systemic disease. After reviewing the risks                            and benefits, the patient was deemed in                            satisfactory condition to undergo the procedure.                           After obtaining informed consent, the colonoscope  was passed under direct vision. Throughout the                            procedure, the patient's blood pressure, pulse, and                            oxygen saturations were monitored continuously. The                            CF [id number] #[id number] was introduced through the anus                            and advanced to the the cecum, identified by                            appendiceal  orifice and ileocecal valve. The                            colonoscopy was performed without difficulty. The                            patient tolerated the procedure well. The quality                            of the bowel preparation was good. The ileocecal                            valve, appendiceal orifice, and rectum were                            photographed. Scope In: 10:48:41 AM Scope Out: 11:02:43 AM Scope Withdrawal Time: 0 hours 11 minutes 1 second  Total Procedure Duration: 0 hours 14 minutes 2 seconds  Findings:                 A 10 mm polyp was found in the recto-sigmoid colon,                            20 cm from the anal verge. The polyp was                            pedunculated. The polyp was removed with a hot                            snare. Resection and retrieval were complete.                           A few small-mouthed diverticula were found in the                            sigmoid colon.                           Non-bleeding internal hemorrhoids were found during  retroflexion. The hemorrhoids were small. Healed                            posterior anal fissure with a small sentinel pile                            was also noted on the rectal exam.                           The exam was otherwise without abnormality on                            direct and retroflexion views. Complications:            No immediate complications. Estimated Blood Loss:     Estimated blood loss: none. Impression:               - One 10 mm polyp at the recto-sigmoid colon,                            removed with a hot snare. Resected and retrieved.                           - Mild sigmoid diverticulosis.                           - Non-bleeding internal hemorrhoids.                           - The examination was otherwise normal on direct                            and retroflexion views. Recommendation:           - Patient has a contact  number available for                            emergencies. The signs and symptoms of potential                            delayed complications were discussed with the                            patient. Return to normal activities tomorrow.                            Written discharge instructions were provided to the                            patient.                           - Resume previous diet.                           - Continue present medications.                           -  Await pathology results.                           - No aspirin, ibuprofen, naproxen, or other                            non-steroidal anti-inflammatory drugs for 5 days                            after polyp removal.                           - Repeat colonoscopy for surveillance based on                            pathology results.                           - The findings and recommendations were discussed                            with the designated responsible adult (Joy). Jackquline Denmark, MD 03/01/2021 11:06:34 AM This report has been signed electronically.

## 2021-03-01 NOTE — Progress Notes (Signed)
pt tolerated well. VSS. awake and to recovery. Report given to RN.  

## 2021-03-01 NOTE — Progress Notes (Signed)
Pt's states no medical or surgical changes since previsit or office visit.   VS taken by CW 

## 2021-03-03 ENCOUNTER — Telehealth: Payer: Self-pay

## 2021-03-03 NOTE — Telephone Encounter (Signed)
  Follow up Call-  Call back number 03/01/2021  Post procedure Call Back phone  # [phone]  Permission to leave phone message Yes  Some recent data might be hidden     Patient questions:  Do you have a fever, pain , or abdominal swelling? No. Pain Score  0 *  Have you tolerated food without any problems? Yes.    Have you been able to return to your normal activities? Yes.    Do you have any questions about your discharge instructions: Diet   No. Medications  No. Follow up visit  No.  Do you have questions or concerns about your Care? No.  Actions: * If pain score is 4 or above: No action needed, pain <4. Have you developed a fever since your procedure? no  2.   Have you had an respiratory symptoms (SOB or cough) since your procedure? no  3.   Have you tested positive for COVID 19 since your procedure no  4.   Have you had any family members/close contacts diagnosed with the COVID 19 since your procedure?  no   If yes to any of these questions please route to Joylene John, RN and Joella Prince, RN

## 2021-03-03 NOTE — Telephone Encounter (Signed)
Attempted to reach pt. With follow-up call following endoscopic procedure 03/01/21.  LM On pt. Voice mail. Will try to reach pt. Again later today.

## 2021-03-07 ENCOUNTER — Other Ambulatory Visit: Payer: Self-pay

## 2021-03-07 ENCOUNTER — Telehealth: Payer: Self-pay

## 2021-03-07 ENCOUNTER — Ambulatory Visit (INDEPENDENT_AMBULATORY_CARE_PROVIDER_SITE_OTHER): Payer: BC Managed Care – PPO

## 2021-03-07 DIAGNOSIS — R079 Chest pain, unspecified: Secondary | ICD-10-CM

## 2021-03-07 DIAGNOSIS — R0602 Shortness of breath: Secondary | ICD-10-CM

## 2021-03-07 DIAGNOSIS — R9431 Abnormal electrocardiogram [ECG] [EKG]: Secondary | ICD-10-CM

## 2021-03-07 LAB — MYOCARDIAL PERFUSION IMAGING
Estimated workload: 7 METS
Exercise duration (min): 6 min
Exercise duration (sec): 19 s
LV dias vol: 46 mL (ref 46–106)
LV sys vol: 23 mL
MPHR: 171 {beats}/min
Peak HR: 166 {beats}/min
Percent HR: 97 %
Rest HR: 96 {beats}/min
SDS: 6
SRS: 8
SSS: 14
TID: 0.96

## 2021-03-07 MED ORDER — TECHNETIUM TC 99M SESTAMIBI GENERIC - CARDIOLITE: 10.5000 | Freq: Once | INTRAVENOUS | Status: DC | PRN

## 2021-03-07 MED ORDER — TECHNETIUM TC 99M TETROFOSMIN IV KIT
10.5000 | PACK | Freq: Once | INTRAVENOUS | Status: AC | PRN
  Administered 2021-03-07: 10.5 via INTRAVENOUS

## 2021-03-07 MED ORDER — TECHNETIUM TC 99M TETROFOSMIN IV KIT
32.6000 | PACK | Freq: Once | INTRAVENOUS | Status: AC | PRN
  Administered 2021-03-07: 32.6 via INTRAVENOUS

## 2021-03-07 NOTE — Telephone Encounter (Signed)
-----   Message from Richardo Priest, MD sent at 03/07/2021  1:52 PM EDT ----- Normal or stable result  Good result Myoview study is normal.

## 2021-03-07 NOTE — Telephone Encounter (Signed)
Left message on patients voicemail to please return our call.   

## 2021-03-08 ENCOUNTER — Telehealth: Payer: Self-pay

## 2021-03-08 NOTE — Telephone Encounter (Signed)
Spoke with patient regarding results and recommendation.  Patient verbalizes understanding and is agreeable to plan of care. Advised patient to call back with any issues or concerns.  

## 2021-03-08 NOTE — Telephone Encounter (Signed)
-----   Message from Richardo Priest, MD sent at 03/07/2021  1:52 PM EDT ----- Normal or stable result  Good result Myoview study is normal.

## 2021-03-16 ENCOUNTER — Encounter: Payer: Self-pay | Admitting: Gastroenterology

## 2021-03-23 ENCOUNTER — Encounter: Payer: Self-pay | Admitting: Cardiology

## 2021-03-23 ENCOUNTER — Ambulatory Visit: Payer: BC Managed Care – PPO | Admitting: Cardiology

## 2021-03-23 ENCOUNTER — Other Ambulatory Visit: Payer: Self-pay

## 2021-03-23 VITALS — BP 130/84 | HR 88 | Ht 63.0 in | Wt 192.0 lb

## 2021-03-23 DIAGNOSIS — I10 Essential (primary) hypertension: Secondary | ICD-10-CM

## 2021-03-23 DIAGNOSIS — E669 Obesity, unspecified: Secondary | ICD-10-CM

## 2021-03-23 DIAGNOSIS — E782 Mixed hyperlipidemia: Secondary | ICD-10-CM

## 2021-03-23 MED ORDER — VALSARTAN 40 MG PO TABS: 40.0000 mg | ORAL_TABLET | Freq: Every day | ORAL | 3 refills | Status: DC

## 2021-03-23 NOTE — Patient Instructions (Signed)
Medication Instructions:  Your physician has recommended you make the following change in your medication:   Start Valsartan 40 mg daily.  *If you need a refill on your cardiac medications before your next appointment, please call your pharmacy*   Lab Work: None ordered If you have labs (blood work) drawn today and your tests are completely normal, you will receive your results only by: [place] (if you have MyChart) OR A paper copy in the mail If you have any lab test that is abnormal or we need to change your treatment, we will call you to review the results.   Testing/Procedures: None ordered   Follow-Up: At [facility], you and your health needs are our priority.  As part of our continuing mission to provide you with exceptional heart care, we have created designated Provider Care Teams.  These Care Teams include your primary Cardiologist (physician) and Advanced Practice Providers (APPs -  Physician Assistants and Nurse Practitioners) who all work together to provide you with the care you need, when you need it.  We recommend signing up for the patient portal called "MyChart".  Sign up information is provided on this After Visit Summary.  MyChart is used to connect with patients for Virtual Visits (Telemedicine).  Patients are able to view lab/test results, encounter notes, upcoming appointments, etc.  Non-urgent messages can be sent to your provider as well.   To learn more about what you can do with MyChart, go to [email or web address].    Your next appointment:   8 week(s)  The format for your next appointment:   MyChart video  Provider:   Berniece Salines, MD   Other Instructions NA

## 2021-03-23 NOTE — Progress Notes (Signed)
Cardiology Office Note:    Date:  03/23/2021   ID:  Anna [name], DOB July 07, 1971, MRN [id number]  PCP:  Bonnita Nasuti, MD  Cardiologist:  Berniece Salines, DO  Electrophysiologist:  None   Referring MD: Bonnita Nasuti, MD   Chief Complaint  Patient presents with   Follow-up    History of Present Illness:    Anna [name] is a 50 y.o. 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  I saw the patient with December 09, 2020 at that time she was experiencing chest discomfort along with shortness of breath I recommend she undergo a coronary CTA.  She also did have a heart murmur echocardiogram was also recommended.  In the interim she was able to get her coronary CTA and we had to change testing to exercise nuclear stress test.  She also had an echocardiogram which showed moderate LVH and normal ejection fraction. She is here today for follow-up visit.  She offers no complaints.  She is doing well from a cardiovascular standpoint.   Past Medical History:  Diagnosis Date   Achilles tendinitis of left lower extremity 04/22/2018   Acute peptic ulcer without hemorrhage and without perforation 11/29/2020   Acute sinusitis 11/29/2020   Bipolar disorder ([place]) 11/29/2020   Breast cancer ([place])    Cancer ([place])    Cardiac murmur    Chest pain    Chronic fatigue syndrome 11/29/2020   Depression    Essential hypertension 11/29/2020   Hepatic steatosis determined by biopsy of liver 07/04/2020   HLD (hyperlipidemia)    Hypertension    Hypothyroidism 11/29/2020   Insomnia 11/29/2020   Malignant neoplasm of upper-outer quadrant of left female breast ([place]) 07/23/2013   Migraine 11/29/2020   Mild intermittent asthma 11/29/2020   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   Mixed hyperlipidemia 11/29/2020   Obesity due to excess calories    Osteopenia after  menopause 07/04/2020   Other long term (current) drug therapy 11/29/2020   Other vitamin B12 deficiency anemias 11/29/2020   Personal history of malignant neoplasm of breast 11/29/2020   Pre-diabetes    Renal cyst, acquired, right 07/04/2020   14.5 cm in October 2021   Retrocalcaneal bursitis (back of heel), left 04/22/2018   Seasonal allergic rhinitis 11/29/2020   Tightness of heel cord, left 04/22/2018   Type 2 diabetes mellitus without complications ([place]) 7/82/4235   Vitamin D deficiency 11/29/2020    Past Surgical History:  Procedure Laterality Date   CESAREAN SECTION     LIVER BIOPSY  01/2019   lumpectomy  2014   Left Breast   PORTACATH PLACEMENT  08/28/2013   STOMACH SURGERY     Tummy Tuck    Current Medications: Current Meds  Medication Sig   albuterol (VENTOLIN HFA) 108 (90 Base) MCG/ACT inhaler Inhale 2 puffs into the lungs every 6 (six) hours as needed for shortness of breath.   ARIPiprazole (ABILIFY) 5 MG tablet Take 2.5 mg by mouth daily.   atorvastatin (LIPITOR) 20 MG tablet Take 1 tablet by mouth daily.   desvenlafaxine (PRISTIQ) 50 MG 24 hr tablet Take 1 tablet by mouth daily.   FARXIGA 10 MG TABS tablet Take 10 mg by mouth daily.   fluticasone (FLONASE) 50 MCG/ACT nasal spray Place 1 spray into both nostrils daily.   labetalol (NORMODYNE) 200 MG tablet Take 200 mg by mouth  2 (two) times daily.   meloxicam (MOBIC) 15 MG tablet Take 1 tablet by mouth daily.   nitroGLYCERIN (NITROSTAT) 0.4 MG SL tablet Place 0.4 mg under the tongue every 5 (five) minutes as needed for chest pain.   ondansetron (ZOFRAN) 4 MG tablet Take 4 mg by mouth as needed for nausea.   OZEMPIC, 0.25 OR 0.5 MG/DOSE, 2 MG/1.5ML SOPN Inject 0.5 mg into the skin once a week.   topiramate (TOPAMAX) 50 MG tablet Take 50 mg by mouth daily.   valsartan (DIOVAN) 40 MG tablet Take 1 tablet (40 mg total) by mouth daily.   Vitamin D, Ergocalciferol, (DRISDOL) 1.25 MG (50000 UNIT) CAPS capsule Take 50,000 Units  by mouth 2 (two) times a week.   Current Facility-Administered Medications for the 03/23/21 encounter (Office Visit) with Berniece Salines, DO  Medication   0.9 %  sodium chloride infusion   technetium sestamibi generic (CARDIOLITE) injection 43.1 millicurie     Allergies:   Patient has no known allergies.   Social History   Socioeconomic History   Marital status: Married    Spouse name: Not on file   Number of children: Not on file   Years of education: Not on file   Highest education level: Not on file  Occupational History   Not on file  Tobacco Use   Smoking status: Former    Types: Cigarettes    Quit date: 2004    Years since quitting: 18.6   Smokeless tobacco: Never  Vaping Use   Vaping Use: Never used  Substance and Sexual Activity   Alcohol use: Yes    Comment: occasionally   Drug use: Never   Sexual activity: Not on file  Other Topics Concern   Not on file  Social History Narrative   Not on file   Social Determinants of Health   Financial Resource Strain: Not on file  Food Insecurity: Not on file  Transportation Needs: Not on file  Physical Activity: Not on file  Stress: Not on file  Social Connections: Not on file     Family History: The patient's family history includes Diabetes in her father; Hyperlipidemia in her mother; Stroke in her father. There is no history of Colon cancer, Colon polyps, Esophageal cancer, Rectal cancer, or Stomach cancer.  ROS:   Review of Systems  Constitution: Negative for decreased appetite, fever and weight gain.  HENT: Negative for congestion, ear discharge, hoarse voice and sore throat.   Eyes: Negative for discharge, redness, vision loss in right eye and visual halos.  Cardiovascular: Negative for chest pain, dyspnea on exertion, leg swelling, orthopnea and palpitations.  Respiratory: Negative for cough, hemoptysis, shortness of breath and snoring.   Endocrine: Negative for heat intolerance and polyphagia.   Hematologic/Lymphatic: Negative for bleeding problem. Does not bruise/bleed easily.  Skin: Negative for flushing, nail changes, rash and suspicious lesions.  Musculoskeletal: Negative for arthritis, joint pain, muscle cramps, myalgias, neck pain and stiffness.  Gastrointestinal: Negative for abdominal pain, bowel incontinence, diarrhea and excessive appetite.  Genitourinary: Negative for decreased libido, genital sores and incomplete emptying.  Neurological: Negative for brief paralysis, focal weakness, headaches and loss of balance.  Psychiatric/Behavioral: Negative for altered mental status, depression and suicidal ideas.  Allergic/Immunologic: Negative for HIV exposure and persistent infections.    EKGs/Labs/Other Studies Reviewed:    The following studies were reviewed today:   EKG: None today  Nuclear stress test March 07, 2021 The left ventricular ejection fraction is mildly decreased (45-54%). Nuclear  stress EF: 50%. There was no ST segment deviation noted during stress. The study is normal. This is a low risk study. Fair exercise capacity.  May 2022 IMPRESSIONS   1. Left ventricular ejection fraction, by estimation, is 55 to 60%. The  left ventricle has normal function. The left ventricle has no regional  wall motion abnormalities. There is moderate concentric left ventricular  hypertrophy. Left ventricular  diastolic parameters are consistent with Grade I diastolic dysfunction  (impaired relaxation).   2. Right ventricular systolic function is normal. The right ventricular  size is normal. There is normal pulmonary artery systolic pressure.   3. The mitral valve is normal in structure. No evidence of mitral valve  regurgitation. No evidence of mitral stenosis.   4. The aortic valve is tricuspid. Aortic valve regurgitation is not  visualized. No aortic stenosis is present.   5. The inferior vena cava is normal in size with greater than 50%  respiratory variability,  suggesting right atrial pressure of 3 mmHg.   FINDINGS   Left Ventricle: Left ventricular ejection fraction, by estimation, is 55  to 60%. The left ventricle has normal function. The left ventricle has no  regional wall motion abnormalities. The left ventricular internal cavity  size was normal in size. There is   moderate concentric left ventricular hypertrophy. Left ventricular  diastolic parameters are consistent with Grade I diastolic dysfunction  (impaired relaxation). Normal left ventricular filling pressure.   Right Ventricle: The right ventricular size is normal. No increase in  right ventricular wall thickness. Right ventricular systolic function is  normal. There is normal pulmonary artery systolic pressure. The tricuspid  regurgitant velocity is 2.06 m/s, and   with an assumed right atrial pressure of 8 mmHg, the estimated right  ventricular systolic pressure is 47.4 mmHg.   Left Atrium: Left atrial size was normal in size.   Right Atrium: Right atrial size was normal in size.   Pericardium: There is no evidence of pericardial effusion.   Mitral Valve: The mitral valve is normal in structure. No evidence of  mitral valve regurgitation. No evidence of mitral valve stenosis.   Tricuspid Valve: The tricuspid valve is normal in structure. Tricuspid  valve regurgitation is trivial. No evidence of tricuspid stenosis.   Aortic Valve: The aortic valve is tricuspid. Aortic valve regurgitation is  not visualized. No aortic stenosis is present.   Pulmonic Valve: The pulmonic valve was normal in structure. Pulmonic valve  regurgitation is not visualized. No evidence of pulmonic stenosis.   Aorta: The aortic root and ascending aorta are structurally normal, with  no evidence of dilitation and the aortic arch was not well visualized.   Venous: The pulmonary veins were not well visualized. The inferior vena  cava is normal in size with greater than 50% respiratory variability,   suggesting right atrial pressure of 3 mmHg.   IAS/Shunts: No atrial level shunt detected by color flow Doppler.   Recent Labs: 11/23/2020: ALT 34; Hemoglobin 13.7; Platelets 257 12/15/2020: BUN 18; Creatinine, Ser 0.80; Magnesium 2.2; Potassium 4.2; Sodium 142  Recent Lipid Panel No results found for: CHOL, TRIG, HDL, CHOLHDL, VLDL, LDLCALC, LDLDIRECT  Physical Exam:    VS:  BP 130/84 (BP Location: Right Arm, Patient Position: Sitting, Cuff Size: Normal)   Pulse 88   Ht 5' 3"  (1.6 m)   Wt 192 lb (87.1 kg)   LMP 08/18/2013   SpO2 94%   BMI 34.01 kg/m     Wt Readings from Last  3 Encounters:  03/23/21 192 lb (87.1 kg)  03/07/21 195 lb (88.5 kg)  03/01/21 195 lb 12.8 oz (88.8 kg)     GEN: Well nourished, well developed in no acute distress HEENT: Normal NECK: No JVD; No carotid bruits LYMPHATICS: No lymphadenopathy CARDIAC: S1S2 noted,RRR, no murmurs, rubs, gallops RESPIRATORY:  Clear to auscultation without rales, wheezing or rhonchi  ABDOMEN: Soft, non-tender, non-distended, +bowel sounds, no guarding. EXTREMITIES: No edema, No cyanosis, no clubbing MUSCULOSKELETAL:  No deformity  SKIN: Warm and dry NEUROLOGIC:  Alert and oriented x 3, non-focal PSYCHIATRIC:  Normal affect, good insight  ASSESSMENT:    1. Essential hypertension   2. Mixed hyperlipidemia   3. Obesity (BMI 30-39.9)    PLAN:     Her blood pressure is now well controlled.  I am going to add 40 mg valsartan daily to this regimen.  She will remain on her labetalol 200 mg twice daily for now. We discussed her testing results.  The patient understands the need to lose weight with diet and exercise. We have discussed specific strategies for this.  The patient is in agreement with the above plan. The patient left the office in stable condition.  The patient will follow up in 8 weeks via MyChart video visit.   Medication Adjustments/Labs and Tests Ordered: Current medicines are reviewed at length with the  patient today.  Concerns regarding medicines are outlined above.  No orders of the defined types were placed in this encounter.  Meds ordered this encounter  Medications   valsartan (DIOVAN) 40 MG tablet    Sig: Take 1 tablet (40 mg total) by mouth daily.    Dispense:  90 tablet    Refill:  3     Patient Instructions  Medication Instructions:  Your physician has recommended you make the following change in your medication:   Start Valsartan 40 mg daily.  *If you need a refill on your cardiac medications before your next appointment, please call your pharmacy*   Lab Work: None ordered If you have labs (blood work) drawn today and your tests are completely normal, you will receive your results only by: [place] (if you have MyChart) OR A paper copy in the mail If you have any lab test that is abnormal or we need to change your treatment, we will call you to review the results.   Testing/Procedures: None ordered   Follow-Up: At [facility], you and your health needs are our priority.  As part of our continuing mission to provide you with exceptional heart care, we have created designated Provider Care Teams.  These Care Teams include your primary Cardiologist (physician) and Advanced Practice Providers (APPs -  Physician Assistants and Nurse Practitioners) who all work together to provide you with the care you need, when you need it.  We recommend signing up for the patient portal called "MyChart".  Sign up information is provided on this After Visit Summary.  MyChart is used to connect with patients for Virtual Visits (Telemedicine).  Patients are able to view lab/test results, encounter notes, upcoming appointments, etc.  Non-urgent messages can be sent to your provider as well.   To learn more about what you can do with MyChart, go to [email or web address].    Your next appointment:   8 week(s)  The format for your next appointment:   MyChart video  Provider:    Berniece Salines, MD   Other Instructions NA     Adopting a Healthy Lifestyle.  Know  what a healthy weight is for you (roughly BMI <25) and aim to maintain this   Aim for 7+ servings of fruits and vegetables daily   65-80+ fluid ounces of water or unsweet tea for healthy kidneys   Limit to max 1 drink of alcohol per day; avoid smoking/tobacco   Limit animal fats in diet for cholesterol and heart health - choose grass fed whenever available   Avoid highly processed foods, and foods high in saturated/trans fats   Aim for low stress - take time to unwind and care for your mental health   Aim for 150 min of moderate intensity exercise weekly for heart health, and weights twice weekly for bone health   Aim for 7-9 hours of sleep daily   When it comes to diets, agreement about the perfect plan isnt easy to find, even among the experts. Experts at the [employer] developed an idea known as the Healthy Eating Plate. Just imagine a plate divided into logical, healthy portions.   The emphasis is on diet quality:   Load up on vegetables and fruits - one-half of your plate: Aim for color and variety, and remember that potatoes dont count.   Go for whole grains - one-quarter of your plate: Whole wheat, barley, wheat berries, quinoa, oats, brown rice, and foods made with them. If you want pasta, go with whole wheat pasta.   Protein power - one-quarter of your plate: Fish, chicken, beans, and nuts are all healthy, versatile protein sources. Limit red meat.   The diet, however, does go beyond the plate, offering a few other suggestions.   Use healthy plant oils, such as olive, canola, soy, corn, sunflower and peanut. Check the labels, and avoid partially hydrogenated oil, which have unhealthy trans fats.   If youre thirsty, drink water. Coffee and tea are good in moderation, but skip sugary drinks and limit milk and dairy products to one or two daily servings.   The  type of carbohydrate in the diet is more important than the amount. Some sources of carbohydrates, such as vegetables, fruits, whole grains, and beans-are healthier than others.   Finally, stay active  Signed, Berniece Salines, DO  03/23/2021 3:31 PM    [facility]

## 2021-04-06 DIAGNOSIS — C50919 Malignant neoplasm of unspecified site of unspecified female breast: Secondary | ICD-10-CM | POA: Insufficient documentation

## 2021-04-06 DIAGNOSIS — K7581 Nonalcoholic steatohepatitis (NASH): Secondary | ICD-10-CM | POA: Insufficient documentation

## 2021-05-05 ENCOUNTER — Other Ambulatory Visit: Payer: Self-pay

## 2021-05-05 DIAGNOSIS — F32A Depression, unspecified: Secondary | ICD-10-CM | POA: Insufficient documentation

## 2021-05-05 DIAGNOSIS — E6609 Other obesity due to excess calories: Secondary | ICD-10-CM | POA: Insufficient documentation

## 2021-05-05 DIAGNOSIS — R7303 Prediabetes: Secondary | ICD-10-CM | POA: Insufficient documentation

## 2021-05-05 DIAGNOSIS — C801 Malignant (primary) neoplasm, unspecified: Secondary | ICD-10-CM | POA: Insufficient documentation

## 2021-05-05 DIAGNOSIS — M75102 Unspecified rotator cuff tear or rupture of left shoulder, not specified as traumatic: Secondary | ICD-10-CM | POA: Insufficient documentation

## 2021-05-08 ENCOUNTER — Telehealth (INDEPENDENT_AMBULATORY_CARE_PROVIDER_SITE_OTHER): Payer: BC Managed Care – PPO | Admitting: Cardiology

## 2021-05-08 ENCOUNTER — Encounter: Payer: Self-pay | Admitting: Cardiology

## 2021-05-08 VITALS — BP 115/86 | HR 97 | Ht 63.0 in | Wt 186.0 lb

## 2021-05-08 DIAGNOSIS — I10 Essential (primary) hypertension: Secondary | ICD-10-CM | POA: Diagnosis not present

## 2021-05-08 DIAGNOSIS — E119 Type 2 diabetes mellitus without complications: Secondary | ICD-10-CM

## 2021-05-08 MED ORDER — LABETALOL HCL 200 MG PO TABS: 200.0000 mg | ORAL_TABLET | Freq: Two times a day (BID) | ORAL | 3 refills | Status: DC

## 2021-05-08 MED ORDER — FARXIGA 10 MG PO TABS: 10.0000 mg | ORAL_TABLET | Freq: Every day | ORAL | 3 refills | Status: DC

## 2021-05-08 MED ORDER — VALSARTAN 40 MG PO TABS: 40.0000 mg | ORAL_TABLET | Freq: Every day | ORAL | 3 refills | Status: DC

## 2021-05-08 MED ORDER — ATORVASTATIN CALCIUM 20 MG PO TABS: 20.0000 mg | ORAL_TABLET | Freq: Every day | ORAL | 3 refills | Status: DC

## 2021-05-08 NOTE — Progress Notes (Signed)
Virtual Visit via Video Note   This visit type was conducted due to national recommendations for restrictions regarding the COVID-19 Pandemic (e.g. social distancing) in an effort to limit this patient's exposure and mitigate transmission in our community.  Due to her co-morbid illnesses, this patient is at least at moderate risk for complications without adequate follow up.  This format is felt to be most appropriate for this patient at this time.  All issues noted in this document were discussed and addressed.  A limited physical exam was performed with this format.  Please refer to the patient's chart for her consent to telehealth for [facility].    Date:  05/08/2021   ID:  Anna [name], DOB 1971-02-28, MRN [id number]  Patient Location: Home virtual Visit via Video  Note . I connected with the patient today by a   video enabled telemedicine application and verified that I am speaking with the correct person using two identifiers.  Provider Location: Office/Clinic  PCP:  Bonnita Nasuti, MD  Cardiologist:  Berniece Salines, DO  Electrophysiologist:  None   Evaluation Performed:  Follow-Up Visit  Chief Complaint:  " I am doing well"  History of Present Illness:    Anna [name] is a 50 y.o. 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  I saw the patient with December 09, 2020 at that time she was experiencing chest discomfort along with shortness of breath I recommend she undergo a coronary CTA.  She also did have a heart murmur echocardiogram was also recommended.  I saw the patient on March 23, 2021 at that time we discussed her exercise nuclear stress test which was normal.  Unfortunately her blood pressure was elevated so I added valsartan to her labetalol.  Today she is here for follow-up visit due to her medication change.  She tells me that her blood pressure has improved  significantly.  And palpitation also has improved.  The patient does have symptoms concerning for COVID-19 infection (fever, chills, cough, or new shortness of breath).    Past Medical History:  Diagnosis Date   Achilles tendinitis of left lower extremity 04/22/2018   Acute peptic ulcer without hemorrhage and without perforation 11/29/2020   Acute sinusitis 11/29/2020   Bipolar disorder ([place]) 11/29/2020   Breast cancer ([place])    Cancer ([place])    Cardiac murmur    Chest pain    Chronic fatigue syndrome 11/29/2020   Depression    Essential hypertension 11/29/2020   Hepatic steatosis determined by biopsy of liver 07/04/2020   HLD (hyperlipidemia)    Hypertension    Hypothyroidism 11/29/2020   Insomnia 11/29/2020   Malignant neoplasm of upper-outer quadrant of left female breast ([place]) 07/23/2013   Migraine 11/29/2020   Mild intermittent asthma 11/29/2020   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   Mixed hyperlipidemia 11/29/2020   Obesity due to excess calories    Osteopenia after menopause 07/04/2020   Other long term (current) drug therapy 11/29/2020   Other vitamin B12 deficiency anemias 11/29/2020   Personal history of malignant neoplasm of breast 11/29/2020   Pre-diabetes    Renal cyst, acquired, right 07/04/2020   14.5 cm in October 2021   Retrocalcaneal bursitis (back of heel), left 04/22/2018   Seasonal allergic rhinitis 11/29/2020   Tightness of heel cord, left 04/22/2018   Type 2 diabetes mellitus without complications ([place]) 02/16/2375   Vitamin D deficiency 11/29/2020  Past Surgical History:  Procedure Laterality Date   CESAREAN SECTION     LIVER BIOPSY  01/2019   lumpectomy  2014   Left Breast   PORTACATH PLACEMENT  08/28/2013   STOMACH SURGERY     Tummy Tuck     Current Meds  Medication Sig   albuterol (VENTOLIN HFA) 108 (90 Base) MCG/ACT inhaler Inhale 2 puffs into the lungs every 6 (six) hours as needed for shortness of breath.   ARIPiprazole (ABILIFY) 5 MG  tablet Take 2.5 mg by mouth daily.   desvenlafaxine (PRISTIQ) 50 MG 24 hr tablet Take 1 tablet by mouth daily.   fluticasone (FLONASE) 50 MCG/ACT nasal spray Place 1 spray into both nostrils daily.   meloxicam (MOBIC) 15 MG tablet Take 15 mg by mouth daily as needed for pain.   nitroGLYCERIN (NITROSTAT) 0.4 MG SL tablet Place 0.4 mg under the tongue every 5 (five) minutes as needed for chest pain.   ondansetron (ZOFRAN) 4 MG tablet Take 4 mg by mouth as needed for nausea.   OZEMPIC, 0.25 OR 0.5 MG/DOSE, 2 MG/1.5ML SOPN Inject 0.5 mg into the skin once a week.   topiramate (TOPAMAX) 50 MG tablet Take 50 mg by mouth daily.   Vitamin D, Ergocalciferol, (DRISDOL) 1.25 MG (50000 UNIT) CAPS capsule Take 50,000 Units by mouth once a week.   [DISCONTINUED] atorvastatin (LIPITOR) 20 MG tablet Take 1 tablet by mouth daily.   [DISCONTINUED] FARXIGA 10 MG TABS tablet Take 10 mg by mouth daily.   [DISCONTINUED] labetalol (NORMODYNE) 200 MG tablet Take 200 mg by mouth 2 (two) times daily.   [DISCONTINUED] valsartan (DIOVAN) 40 MG tablet Take 1 tablet (40 mg total) by mouth daily.   Current Facility-Administered Medications for the 05/08/21 encounter (Video Visit) with Margarit Minshall, DO  Medication   0.9 %  sodium chloride infusion   technetium sestamibi generic (CARDIOLITE) injection 16.1 millicurie     Allergies:   Patient has no known allergies.   Social History   Tobacco Use   Smoking status: Former    Types: Cigarettes    Quit date: 2004    Years since quitting: 18.7   Smokeless tobacco: Never  Vaping Use   Vaping Use: Never used  Substance Use Topics   Alcohol use: Yes    Comment: occasionally   Drug use: Never     Family Hx: The patient's family history includes Diabetes in her father; Hyperlipidemia in her mother; Stroke in her father. There is no history of Colon cancer, Colon polyps, Esophageal cancer, Rectal cancer, or Stomach cancer.  ROS:   Please see the history of present  illness.     All other systems reviewed and are negative.   Prior CV studies:   The following studies were reviewed today:  Nuclear stress test March 07, 2021 The left ventricular ejection fraction is mildly decreased (45-54%). Nuclear stress EF: 50%. There was no ST segment deviation noted during stress. The study is normal. This is a low risk study. Fair exercise capacity.   May 2022 IMPRESSIONS   1. Left ventricular ejection fraction, by estimation, is 55 to 60%. The  left ventricle has normal function. The left ventricle has no regional  wall motion abnormalities. There is moderate concentric left ventricular  hypertrophy. Left ventricular  diastolic parameters are consistent with Grade I diastolic dysfunction  (impaired relaxation).   2. Right ventricular systolic function is normal. The right ventricular  size is normal. There is normal pulmonary artery systolic  pressure.   3. The mitral valve is normal in structure. No evidence of mitral valve  regurgitation. No evidence of mitral stenosis.   4. The aortic valve is tricuspid. Aortic valve regurgitation is not  visualized. No aortic stenosis is present.   5. The inferior vena cava is normal in size with greater than 50%  respiratory variability, suggesting right atrial pressure of 3 mmHg.   FINDINGS   Left Ventricle: Left ventricular ejection fraction, by estimation, is 55  to 60%. The left ventricle has normal function. The left ventricle has no  regional wall motion abnormalities. The left ventricular internal cavity  size was normal in size. There is   moderate concentric left ventricular hypertrophy. Left ventricular  diastolic parameters are consistent with Grade I diastolic dysfunction  (impaired relaxation). Normal left ventricular filling pressure.   Right Ventricle: The right ventricular size is normal. No increase in  right ventricular wall thickness. Right ventricular systolic function is  normal. There is  normal pulmonary artery systolic pressure. The tricuspid  regurgitant velocity is 2.06 m/s, and   with an assumed right atrial pressure of 8 mmHg, the estimated right  ventricular systolic pressure is 95.6 mmHg.   Left Atrium: Left atrial size was normal in size.   Right Atrium: Right atrial size was normal in size.   Pericardium: There is no evidence of pericardial effusion.   Mitral Valve: The mitral valve is normal in structure. No evidence of  mitral valve regurgitation. No evidence of mitral valve stenosis.   Tricuspid Valve: The tricuspid valve is normal in structure. Tricuspid  valve regurgitation is trivial. No evidence of tricuspid stenosis.   Aortic Valve: The aortic valve is tricuspid. Aortic valve regurgitation is  not visualized. No aortic stenosis is present.   Pulmonic Valve: The pulmonic valve was normal in structure. Pulmonic valve  regurgitation is not visualized. No evidence of pulmonic stenosis.   Aorta: The aortic root and ascending aorta are structurally normal, with  no evidence of dilitation and the aortic arch was not well visualized.   Venous: The pulmonary veins were not well visualized. The inferior vena  cava is normal in size with greater than 50% respiratory variability,  suggesting right atrial pressure of 3 mmHg.   IAS/Shunts: No atrial level shunt detected by color flow Doppler.    Labs/Other Tests and Data Reviewed:    EKG:  No ECG reviewed.  Recent Labs: 11/23/2020: ALT 34; Hemoglobin 13.7; Platelets 257 12/15/2020: BUN 18; Creatinine, Ser 0.80; Magnesium 2.2; Potassium 4.2; Sodium 142   Recent Lipid Panel No results found for: CHOL, TRIG, HDL, CHOLHDL, LDLCALC, LDLDIRECT  Wt Readings from Last 3 Encounters:  05/08/21 186 lb (84.4 kg)  03/23/21 192 lb (87.1 kg)  03/07/21 195 lb (88.5 kg)     Objective:    Vital Signs:  BP 115/86   Pulse 97   Ht 5' 3"  (1.6 m)   Wt 186 lb (84.4 kg)   LMP 08/18/2013   BMI 32.95 kg/m       ASSESSMENT & PLAN:    Hypertension Hyperlipidemia Obesity  Her blood pressure is at target we will make no changes to her regimen.  We will keep the patient on her current regimen of valsartan 40 mg daily labetalol 200 mg twice a day.  Hyperlipidemia - continue with current statin medication. The patient understands the need to lose weight with diet and exercise. We have discussed specific strategies for this.  COVID-19 Education: The signs and  symptoms of COVID-19 were discussed with the patient and how to seek care for testing (follow up with PCP or arrange E-visit).  The importance of social distancing was discussed today.  Time:   Today, I have spent 7 minutes with the patient with telehealth technology discussing the above problems.     Medication Adjustments/Labs and Tests Ordered: Current medicines are reviewed at length with the patient today.  Concerns regarding medicines are outlined above.   Tests Ordered: No orders of the defined types were placed in this encounter.   Medication Changes: Meds ordered this encounter  Medications   atorvastatin (LIPITOR) 20 MG tablet    Sig: Take 1 tablet (20 mg total) by mouth daily.    Dispense:  90 tablet    Refill:  3   FARXIGA 10 MG TABS tablet    Sig: Take 1 tablet (10 mg total) by mouth daily.    Dispense:  90 tablet    Refill:  3   labetalol (NORMODYNE) 200 MG tablet    Sig: Take 1 tablet (200 mg total) by mouth 2 (two) times daily.    Dispense:  180 tablet    Refill:  3   valsartan (DIOVAN) 40 MG tablet    Sig: Take 1 tablet (40 mg total) by mouth daily.    Dispense:  90 tablet    Refill:  3    Follow Up:  In Person in 1 year(s)  Signed, Berniece Salines, DO  05/08/2021 3:36 PM    [facility]

## 2021-05-08 NOTE — Patient Instructions (Addendum)
Medication Instructions:  Your physician recommends that you continue on your current medications as directed. Please refer to the Current Medication list given to you today.  *If you need a refill on your cardiac medications before your next appointment, please call your pharmacy*   Lab Work: None If you have labs (blood work) drawn today and your tests are completely normal, you will receive your results only by: [place] (if you have MyChart) OR A paper copy in the mail If you have any lab test that is abnormal or we need to change your treatment, we will call you to review the results.   Testing/Procedures: None   Follow-Up: At [facility], you and your health needs are our priority.  As part of our continuing mission to provide you with exceptional heart care, we have created designated Provider Care Teams.  These Care Teams include your primary Cardiologist (physician) and Advanced Practice Providers (APPs -  Physician Assistants and Nurse Practitioners) who all work together to provide you with the care you need, when you need it.  We recommend signing up for the patient portal called "MyChart".  Sign up information is provided on this After Visit Summary.  MyChart is used to connect with patients for Virtual Visits (Telemedicine).  Patients are able to view lab/test results, encounter notes, upcoming appointments, etc.  Non-urgent messages can be sent to your provider as well.   To learn more about what you can do with MyChart, go to [email or web address].    Your next appointment:   1 year(s)  The format for your next appointment:   In Person  Provider:   Berniece Salines, DO [address], [place], [place] [address]

## 2021-05-30 DIAGNOSIS — I890 Lymphedema, not elsewhere classified: Secondary | ICD-10-CM | POA: Insufficient documentation

## 2021-06-28 ENCOUNTER — Encounter: Payer: Self-pay | Admitting: Oncology

## 2021-06-28 ENCOUNTER — Other Ambulatory Visit: Payer: Self-pay | Admitting: Oncology

## 2021-06-28 DIAGNOSIS — Z170 Estrogen receptor positive status [ER+]: Secondary | ICD-10-CM

## 2021-06-28 DIAGNOSIS — C7951 Secondary malignant neoplasm of bone: Secondary | ICD-10-CM | POA: Insufficient documentation

## 2021-06-29 ENCOUNTER — Telehealth: Payer: Self-pay

## 2021-06-29 NOTE — Progress Notes (Signed)
Requested surgical orders via Epic inbox.

## 2021-06-29 NOTE — Progress Notes (Addendum)
COVID swab appointment: 06-30-21  COVID Vaccine Completed:Yes x2 Date COVID Vaccine completed: Has received booster:  Yes x2 COVID vaccine manufacturer: Pfizer      Date of COVID positive in last 90 days:  No  PCP - Donnetta Hutching, MD.  Notes on chart Cardiologist - Berniece Salines, DO  Chest x-ray - N/A EKG - 12-09-20 Epic Stress Test - 03-07-21 Epic ECHO - 01-05-21 Epic Cardiac Cath - N/A Pacemaker/ICD device last checked: Spinal Cord Stimulator:  Sleep Study - N/A CPAP -   Fasting Blood Sugar - 90 to 105 Checks Blood Sugar x 1 time a day  Blood Thinner Instructions:  N/A Aspirin Instructions: Last Dose:  Activity level:  Can go up a flight of stairs and perform activities of daily living without stopping and without symptoms of chest pain or shortness of breath.  Some limitations due to hip pain   Anesthesia review:  Hx of chest pain, SOB, murmur evaluated by cardiology.  HTN, DM  Patient denies shortness of breath, fever, cough and chest pain at PAT appointment   Patient verbalized understanding of instructions that were given to them at the PAT appointment. Patient was also instructed that they will need to review over the PAT instructions again at home before surgery.

## 2021-06-29 NOTE — Telephone Encounter (Signed)
-----   Message from Derwood Kaplan, MD sent at 06/28/2021 11:19 AM EST ----- Regarding: records Need MRI and xrays from [facility]  Call from Dr. 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 I called her this am, saw Dr. Ricard Dillon and he will do surgical stabilization procedure Tues 11/22 I asked Langley Gauss to get her on for PET 11/28 if poss, and f/u with me 12/1 with labs,FYI

## 2021-06-29 NOTE — Patient Instructions (Addendum)
DUE TO COVID-19 ONLY ONE VISITOR IS ALLOWED TO COME WITH YOU AND STAY IN THE WAITING ROOM ONLY DURING PRE OP AND PROCEDURE.   **NO VISITORS ARE ALLOWED IN THE SHORT STAY AREA OR RECOVERY ROOM!!**  IF YOU WILL BE ADMITTED INTO THE HOSPITAL YOU ARE ALLOWED ONLY TWO SUPPORT PEOPLE DURING VISITATION HOURS ONLY (7 AM -8PM)    Up to two visitors ages 28+ are allowed at one time in a patient's room.  The visitors may rotate out with other people throughout the day.  Additionally, up to two children between the ages of 27 and 72 are allowed and do not count toward the number of allowed visitors.  Children within this age range must be accompanied by an adult visitor.  One adult visitor may remain with the patient overnight and must be in the room by 8 PM.  COVID SWAB TESTING MUST BE COMPLETED ON:  Friday, 06-30-21, Between the hours of 8 and 3  **MUST PRESENT COMPLETED FORM AT TESTING SITE**    [facility] [place] (backside of the building)  You are not required to quarantine, however you are required to wear a well-fitted mask when you are out and around people not in your household.  Hand Hygiene often Do NOT share personal items Notify your provider if you are in close contact with someone who has COVID or you develop fever 100.4 or greater, new onset of sneezing, cough, sore throat, shortness of breath or body aches.        Your procedure is scheduled on:  Tuesday, 07-04-21   Report to [facility] Main  Entrance     Report to admitting at 10:10 AM   Call this number if you have problems the morning of surgery [phone]   Do not eat food :After Midnight.   May have liquids until 9:50 AM  day of surgery  CLEAR LIQUID DIET  Foods Allowed                                                                     Foods Excluded  Water, Black Coffee (no milk/no creamer) and tea, regular and decaf                              liquids that you cannot  Plain Jell-O in any  flavor  (No red)                         see through such as: Fruit ices (not with fruit pulp)                                 milk, soups, orange juice  Iced Popsicles (No red)                                    All solid food                             Apple  juices Sports drinks like Gatorade (No red) Lightly seasoned clear broth or consume(fat free) Sugar    Oral Hygiene is also important to reduce your risk of infection.                                    Remember - BRUSH YOUR TEETH THE MORNING OF SURGERY WITH YOUR REGULAR TOOTHPASTE   Do NOT smoke after Midnight  Take these medicines the morning of surgery with A SIP OF WATER:  Abilify, Atorvastatin, Pristiq, Labetalol, Topiramate.  Okay to use inhalers/nasal sprays.  Okay to take Ondansetron and Tramadol if needed  How to Manage Your Diabetes Before and After Surgery  Why is it important to control my blood sugar before and after surgery? Improving blood sugar levels before and after surgery helps healing and can limit problems. A way of improving blood sugar control is eating a healthy diet by:  Eating less sugar and carbohydrates  Increasing activity/exercise  Talking with your doctor about reaching your blood sugar goals High blood sugars (greater than 180 mg/dL) can raise your risk of infections and slow your recovery, so you will need to focus on controlling your diabetes during the weeks before surgery. Make sure that the doctor who takes care of your diabetes knows about your planned surgery including the date and location.  How do I manage my blood sugar before surgery? Check your blood sugar at least 4 times a day, starting 2 days before surgery, to make sure that the level is not too high or low. Check your blood sugar the morning of your surgery when you wake up and every 2 hours until you get to the Short Stay unit. If your blood sugar is less than 70 mg/dL, you will need to treat for low blood sugar: Do not take  insulin. Treat a low blood sugar (less than 70 mg/dL) with  cup of clear juice (cranberry or apple), 4 glucose tablets, OR glucose gel. Recheck blood sugar in 15 minutes after treatment (to make sure it is greater than 70 mg/dL). If your blood sugar is not greater than 70 mg/dL on recheck, call [phone] for further instructions. Report your blood sugar to the short stay nurse when you get to Short Stay.  If you are admitted to the hospital after surgery: Your blood sugar will be checked by the staff and you will probably be given insulin after surgery (instead of oral diabetes medicines) to make sure you have good blood sugar levels. The goal for blood sugar control after surgery is 80-180 mg/dL.   WHAT DO I DO ABOUT MY DIABETES MEDICATION?  Do not take oral diabetes medicines (pills) the morning of surgery.  THE DAY BEFORE SURGERY:  Hold Farxiga.       THE MORNING OF SURGERY:  Do not take any diabetes medications.   Reviewed and Endorsed by [facility] Patient Education Committee, August 2015    Stop all vitamins and herbal supplements a week before surgery             You may not have any metal on your body including hair pins, jewelry, and body piercing             Do not wear make-up, lotions, powders, perfumes or deodorant  Do not wear nail polish including gel and S&S, artificial/acrylic nails, or any other type of covering on natural nails including finger and toenails.  If you have artificial nails, gel coating, etc. that needs to be removed by a nail salon please have this removed prior to surgery or surgery may need to be canceled/ delayed if the surgeon/ anesthesia feels like they are unable to be safely monitored.   Do not shave  48 hours prior to surgery.   Do not bring valuables to the hospital. [place].   Contacts, dentures or bridgework may not be worn into surgery.   Bring small overnight bag day of surgery.   Please read  over the following fact sheets you were given: IF YOU HAVE QUESTIONS ABOUT YOUR PRE OP INSTRUCTIONS PLEASE CALL [place] - Preparing for Surgery Before surgery, you can play an important role.  Because skin is not sterile, your skin needs to be as free of germs as possible.  You can reduce the number of germs on your skin by washing with CHG (chlorahexidine gluconate) soap before surgery.  CHG is an antiseptic cleaner which kills germs and bonds with the skin to continue killing germs even after washing. Please DO NOT use if you have an allergy to CHG or antibacterial soaps.  If your skin becomes reddened/irritated stop using the CHG and inform your nurse when you arrive at Short Stay. Do not shave (including legs and underarms) for at least 48 hours prior to the first CHG shower.  You may shave your face/neck.  Please follow these instructions carefully:  1.  Shower with CHG Soap the night before surgery and the  morning of surgery.  2.  If you choose to wash your hair, wash your hair first as usual with your normal  shampoo.  3.  After you shampoo, rinse your hair and body thoroughly to remove the shampoo.                             4.  Use CHG as you would any other liquid soap.  You can apply chg directly to the skin and wash.  Gently with a scrungie or clean washcloth.  5.  Apply the CHG Soap to your body ONLY FROM THE NECK DOWN.   Do   not use on face/ open                           Wound or open sores. Avoid contact with eyes, ears mouth and   genitals (private parts).                       Wash face,  Genitals (private parts) with your normal soap.             6.  Wash thoroughly, paying special attention to the area where your    surgery  will be performed.  7.  Thoroughly rinse your body with warm water from the neck down.  8.  DO NOT shower/wash with your normal soap after using and rinsing off the CHG Soap.                9.  Pat yourself dry with a clean  towel.            10.  Wear clean pajamas.            11.  Place clean sheets on your bed the night of your first shower and do  not  sleep with pets. Day of Surgery : Do not apply any lotions/deodorants the morning of surgery.  Please wear clean clothes to the hospital/surgery center.  FAILURE TO FOLLOW THESE INSTRUCTIONS MAY RESULT IN THE CANCELLATION OF YOUR SURGERY  PATIENT SIGNATURE_________________________________  NURSE SIGNATURE__________________________________  ________________________________________________________________________     Adam Phenix  An incentive spirometer is a tool that can help keep your lungs clear and active. This tool measures how well you are filling your lungs with each breath. Taking long deep breaths may help reverse or decrease the chance of developing breathing (pulmonary) problems (especially infection) following: A long period of time when you are unable to move or be active. BEFORE THE PROCEDURE  If the spirometer includes an indicator to show your best effort, your nurse or respiratory therapist will set it to a desired goal. If possible, sit up straight or lean slightly forward. Try not to slouch. Hold the incentive spirometer in an upright position. INSTRUCTIONS FOR USE  Sit on the edge of your bed if possible, or sit up as far as you can in bed or on a chair. Hold the incentive spirometer in an upright position. Breathe out normally. Place the mouthpiece in your mouth and seal your lips tightly around it. Breathe in slowly and as deeply as possible, raising the piston or the ball toward the top of the column. Hold your breath for 3-5 seconds or for as long as possible. Allow the piston or ball to fall to the bottom of the column. Remove the mouthpiece from your mouth and breathe out normally. Rest for a few seconds and repeat Steps 1 through 7 at least 10 times every 1-2 hours when you are awake. Take your time and take a few normal  breaths between deep breaths. The spirometer may include an indicator to show your best effort. Use the indicator as a goal to work toward during each repetition. After each set of 10 deep breaths, practice coughing to be sure your lungs are clear. If you have an incision (the cut made at the time of surgery), support your incision when coughing by placing a pillow or rolled up towels firmly against it. Once you are able to get out of bed, walk around indoors and cough well. You may stop using the incentive spirometer when instructed by your caregiver.  RISKS AND COMPLICATIONS Take your time so you do not get dizzy or light-headed. If you are in pain, you may need to take or ask for pain medication before doing incentive spirometry. It is harder to take a deep breath if you are having pain. AFTER USE Rest and breathe slowly and easily. It can be helpful to keep track of a log of your progress. Your caregiver can provide you with a simple table to help with this. If you are using the spirometer at home, follow these instructions: [place] IF:  You are having difficultly using the spirometer. You have trouble using the spirometer as often as instructed. Your pain medication is not giving enough relief while using the spirometer. You develop fever of 100.5 F (38.1 C) or higher. SEEK IMMEDIATE MEDICAL CARE IF:  You cough up bloody sputum that had not been present before. You develop fever of 102 F (38.9 C) or greater. You develop worsening pain at or near the incision site. MAKE SURE YOU:  Understand these instructions. Will watch your condition. Will get help right away if you are not doing well or get worse. Document  Released: 12/10/2006 Document Revised: 10/22/2011 Document Reviewed: 02/10/2007 [facility] Patient Information 2014 [place], [place].   ________________________________________________________________________

## 2021-06-29 NOTE — Telephone Encounter (Signed)
Called and talked with medical records. They will mail the records to use along with disk of scans and MRI.

## 2021-06-30 ENCOUNTER — Telehealth: Payer: Self-pay | Admitting: Oncology

## 2021-06-30 ENCOUNTER — Other Ambulatory Visit: Payer: Self-pay

## 2021-06-30 ENCOUNTER — Other Ambulatory Visit: Payer: Self-pay | Admitting: Orthopedic Surgery

## 2021-06-30 ENCOUNTER — Encounter (HOSPITAL_COMMUNITY)
Admission: RE | Admit: 2021-06-30 | Discharge: 2021-06-30 | Disposition: A | Discharge status: Home / Self Care | Payer: BC Managed Care – PPO | Source: Ambulatory Visit | Attending: Orthopedic Surgery | Admitting: Orthopedic Surgery

## 2021-06-30 ENCOUNTER — Encounter (HOSPITAL_COMMUNITY): Payer: Self-pay

## 2021-06-30 VITALS — BP 109/72 | HR 86 | Temp 98.9°F | Resp 18 | Ht 62.5 in | Wt 180.8 lb

## 2021-06-30 DIAGNOSIS — K769 Liver disease, unspecified: Secondary | ICD-10-CM

## 2021-06-30 DIAGNOSIS — Z01812 Encounter for preprocedural laboratory examination: Secondary | ICD-10-CM | POA: Diagnosis not present

## 2021-06-30 DIAGNOSIS — Z853 Personal history of malignant neoplasm of breast: Secondary | ICD-10-CM | POA: Insufficient documentation

## 2021-06-30 DIAGNOSIS — Z01818 Encounter for other preprocedural examination: Secondary | ICD-10-CM

## 2021-06-30 DIAGNOSIS — J4520 Mild intermittent asthma, uncomplicated: Secondary | ICD-10-CM | POA: Insufficient documentation

## 2021-06-30 DIAGNOSIS — I10 Essential (primary) hypertension: Secondary | ICD-10-CM | POA: Insufficient documentation

## 2021-06-30 DIAGNOSIS — K719 Toxic liver disease, unspecified: Secondary | ICD-10-CM | POA: Insufficient documentation

## 2021-06-30 DIAGNOSIS — E119 Type 2 diabetes mellitus without complications: Secondary | ICD-10-CM | POA: Insufficient documentation

## 2021-06-30 DIAGNOSIS — M84451A Pathological fracture, right femur, initial encounter for fracture: Secondary | ICD-10-CM | POA: Diagnosis not present

## 2021-06-30 DIAGNOSIS — Z87891 Personal history of nicotine dependence: Secondary | ICD-10-CM | POA: Diagnosis not present

## 2021-06-30 DIAGNOSIS — T386X5D Adverse effect of antigonadotrophins, antiestrogens, antiandrogens, not elsewhere classified, subsequent encounter: Secondary | ICD-10-CM | POA: Insufficient documentation

## 2021-06-30 LAB — CBC
HCT: 44.5 % (ref 36.0–46.0)
Hemoglobin: 13.9 g/dL (ref 12.0–15.0)
MCH: 26.9 pg (ref 26.0–34.0)
MCHC: 31.2 g/dL (ref 30.0–36.0)
MCV: 86.2 fL (ref 80.0–100.0)
Platelets: 258 10*3/uL (ref 150–400)
RBC: 5.16 MIL/uL — ABNORMAL HIGH (ref 3.87–5.11)
RDW: 15.2 % (ref 11.5–15.5)
WBC: 9.1 10*3/uL (ref 4.0–10.5)
nRBC: 0 % (ref 0.0–0.2)

## 2021-06-30 LAB — COMPREHENSIVE METABOLIC PANEL
ALT: 27 U/L (ref 0–44)
AST: 19 U/L (ref 15–41)
Albumin: 4.5 g/dL (ref 3.5–5.0)
Alkaline Phosphatase: 115 U/L (ref 38–126)
Anion gap: 7 (ref 5–15)
BUN: 20 mg/dL (ref 6–20)
CO2: 26 mmol/L (ref 22–32)
Calcium: 9.7 mg/dL (ref 8.9–10.3)
Chloride: 105 mmol/L (ref 98–111)
Creatinine, Ser: 0.87 mg/dL (ref 0.44–1.00)
GFR, Estimated: >60 mL/min (ref >60)
Glucose, Bld: 110 mg/dL — ABNORMAL HIGH (ref 70–99)
Potassium: 3.9 mmol/L (ref 3.5–5.1)
Sodium: 138 mmol/L (ref 135–145)
Total Bilirubin: 0.7 mg/dL (ref 0.3–1.2)
Total Protein: 7.7 g/dL (ref 6.5–8.1)

## 2021-06-30 LAB — SURGICAL PCR SCREEN
MRSA, PCR: NEGATIVE
Staphylococcus aureus: POSITIVE — AB

## 2021-06-30 LAB — GLUCOSE, CAPILLARY: Glucose-Capillary: 109 mg/dL — ABNORMAL HIGH (ref 70–99)

## 2021-06-30 LAB — SARS CORONAVIRUS 2 (TAT 6-24 HRS): SARS Coronavirus 2: NEGATIVE

## 2021-06-30 LAB — HEMOGLOBIN A1C
Hgb A1c MFr Bld: 6.3 % — ABNORMAL HIGH (ref 4.8–5.6)
Mean Plasma Glucose: 134.11 mg/dL

## 2021-06-30 NOTE — Progress Notes (Signed)
PCR results sent to Dr. Alvan Dame to review.

## 2021-06-30 NOTE — Telephone Encounter (Signed)
06/30/21 Spoke with patient and scheduled all appts

## 2021-07-03 NOTE — Progress Notes (Signed)
Called patient about time change for surgery on 07/04/21. Patient to arrive 0845 for 1130 surgery. Patient verbalizes understanding.

## 2021-07-03 NOTE — H&P (Addendum)
TOTAL HIP ADMISSION H&P  Patient is admitted for right total hip arthroplasty.  Subjective:  Chief Complaint: right hip pain, impending pathologic femur fracture  HPI: Anna [name], 50 y.o. female, has a history of pain and functional disability in the right hip(s) due to  impending pathologic femur fracture  and patient has failed non-surgical conservative treatments weeks to include NSAID's and/or analgesics and activity modification. She does have a history of breast cancer several years ago. Onset of symptoms was abrupt starting  several months  ago with rapidlly worsening course since that time.The patient noted no past surgery on the right hip(s).  Patient currently rates pain in the right hip at 7 out of 10 with activity. Patient has worsening of pain with activity and weight bearing, pain that interfers with activities of daily living, and pain with passive range of motion. Patient has evidence of  lytic lesions within the femoral head and greater trochanter, impending pathologic femur fracture  by imaging studies. This condition presents safety issues increasing the risk of falls.  There is no current active infection.  Patient Active Problem List   Diagnosis Date Noted   Bone metastases ([place]) 06/28/2021   Pre-diabetes 05/05/2021   Obesity due to excess calories 05/05/2021   Depression 05/05/2021   Unspecified rotator cuff tear or rupture of left shoulder, not specified as traumatic 05/05/2021   Malignant neoplasm of breast ([place]) 26/37/8588   Nonalcoholic steatohepatitis ([facility]) 04/06/2021   Other chest pain 12/09/2020   SOB (shortness of breath) 12/09/2020   Murmur, cardiac 12/09/2020   Hypertension 12/09/2020   Sinus tachycardia 12/09/2020   Abnormal EKG 12/09/2020   Obesity (BMI 30-39.9) 12/09/2020   Acute peptic ulcer without hemorrhage and without perforation 11/29/2020   Acute sinusitis 11/29/2020   Depressive disorder 11/29/2020   Chronic fatigue syndrome 11/29/2020    Encounter for general adult medical examination without abnormal findings 11/29/2020   Essential hypertension 11/29/2020   Hypothyroidism 11/29/2020   Insomnia 11/29/2020   Migraine 11/29/2020   Mild intermittent asthma 11/29/2020   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   Other long term (current) drug therapy 11/29/2020   Hyperlipidemia 11/29/2020   Other vitamin B12 deficiency anemias 11/29/2020   Personal history of malignant neoplasm of breast 11/29/2020   Seasonal allergic rhinitis 11/29/2020   Type 2 diabetes mellitus without complications ([place]) 50/27/7412   Vitamin D deficiency 11/29/2020   Type 2 diabetes mellitus without complication ([place]) 87/86/7672   Hepatic steatosis determined by biopsy of liver 07/04/2020   Osteopenia after menopause 07/04/2020   Renal cyst, acquired, right 07/04/2020   Achilles tendinitis of left lower extremity 04/22/2018   Retrocalcaneal bursitis (back of heel), left 04/22/2018   Tightness of heel cord, left 04/22/2018   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   Past Medical History:  Diagnosis Date   Achilles tendinitis of left lower extremity 04/22/2018   Acute peptic ulcer without hemorrhage and without perforation 11/29/2020   Acute sinusitis 11/29/2020   Bipolar disorder ([place]) 11/29/2020   Breast cancer ([place])    Cancer ([place])    Cardiac murmur    Chest pain    Chronic fatigue syndrome 11/29/2020   Depression    Essential hypertension 11/29/2020   Hepatic steatosis determined by biopsy of liver 07/04/2020   HLD (hyperlipidemia)    Hypertension    Hypothyroidism 11/29/2020   Insomnia 11/29/2020   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  Migraine 11/29/2020   Mild intermittent asthma 11/29/2020   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   Mixed hyperlipidemia 11/29/2020   Obesity due to excess calories    Osteopenia after menopause 07/04/2020    Other long term (current) drug therapy 11/29/2020   Other vitamin B12 deficiency anemias 11/29/2020   Personal history of malignant neoplasm of breast 11/29/2020   Pre-diabetes    Renal cyst, acquired, right 07/04/2020   14.5 cm in October 2021   Retrocalcaneal bursitis (back of heel), left 04/22/2018   Seasonal allergic rhinitis 11/29/2020   Tightness of heel cord, left 04/22/2018   Type 2 diabetes mellitus without complications ([place]) 9/62/9528   Vitamin D deficiency 11/29/2020    Past Surgical History:  Procedure Laterality Date   CESAREAN SECTION     LIVER BIOPSY  01/2019   lumpectomy  2014   Left Breast   PORT-A-CATH REMOVAL     PORTACATH PLACEMENT  08/28/2013   STOMACH SURGERY     Tummy Tuck   WISDOM TOOTH EXTRACTION      Current Facility-Administered Medications  Medication Dose Route Frequency Provider Last Rate Last Admin   0.9 %  sodium chloride infusion  500 mL Intravenous Once Jackquline Denmark, MD       technetium sestamibi generic (CARDIOLITE) injection 41.3 millicurie  24.4 millicurie Intravenous Once PRN Revankar, Reita Cliche, MD       Current Outpatient Medications  Medication Sig Dispense Refill Last Dose   albuterol (VENTOLIN HFA) 108 (90 Base) MCG/ACT inhaler Inhale 2 puffs into the lungs every 6 (six) hours as needed for shortness of breath.      ARIPiprazole (ABILIFY) 5 MG tablet Take 2.5 mg by mouth daily.      atorvastatin (LIPITOR) 20 MG tablet Take 1 tablet (20 mg total) by mouth daily. 90 tablet 3    desvenlafaxine (PRISTIQ) 50 MG 24 hr tablet Take 50 mg by mouth daily.      FARXIGA 10 MG TABS tablet Take 1 tablet (10 mg total) by mouth daily. 90 tablet 3    fluticasone (FLONASE) 50 MCG/ACT nasal spray Place 1 spray into both nostrils daily.      labetalol (NORMODYNE) 200 MG tablet Take 1 tablet (200 mg total) by mouth 2 (two) times daily. 180 tablet 3    nitroGLYCERIN (NITROSTAT) 0.4 MG SL tablet Place 0.4 mg under the tongue every 5 (five) minutes as needed for  chest pain.      ondansetron (ZOFRAN) 4 MG tablet Take 4 mg by mouth daily as needed for nausea.      tirzepatide [facility]) 5 MG/0.5ML Pen Inject 2.5 mg into the skin every Thursday.      topiramate (TOPAMAX) 50 MG tablet Take 50 mg by mouth daily.      traMADol (ULTRAM) 50 MG tablet Take 50 mg by mouth 2 (two) times daily as needed for moderate pain or severe pain.      valsartan (DIOVAN) 40 MG tablet Take 1 tablet (40 mg total) by mouth daily. 90 tablet 3    Vitamin D, Ergocalciferol, (DRISDOL) 1.25 MG (50000 UNIT) CAPS capsule Take 50,000 Units by mouth once a week.      No Known Allergies  Social History   Tobacco Use   Smoking status: Former    Types: Cigarettes    Quit date: 2004    Years since quitting: 18.9   Smokeless tobacco: Never  Substance Use Topics   Alcohol use: Yes    Comment: occasionally  Family History  Problem Relation Age of Onset   Hyperlipidemia Mother    Diabetes Father    Stroke Father    Colon cancer Neg Hx    Colon polyps Neg Hx    Esophageal cancer Neg Hx    Rectal cancer Neg Hx    Stomach cancer Neg Hx      Review of Systems  Constitutional:  Negative for chills and fever.  Respiratory:  Negative for cough and shortness of breath.   Cardiovascular:  Negative for chest pain.  Gastrointestinal:  Negative for nausea and vomiting.  Musculoskeletal:  Positive for arthralgias.    Objective:  Physical Exam Last recorded BMI was 41 Very pleasant 50 year old female present in the office today with her husband for evaluation of her right hip. She has been using assist devices for mobilization since she saw Dr. Lawernce Pitts and the radiographic findings were presented to her.  Right hip exam: Examination of the right hip is somewhat limited today based on her radiographic findings in an effort to try to reduce discomfort to her. There is no gross deformity or shortening through the right lower extremity currently She does have pain with weightbearing  activities She has mild tenderness laterally She is neurovascular intact distally  Vital signs in last 24 hours:    Labs:   Estimated body mass index is 32.54 kg/m as calculated from the following:   Height as of 06/30/21: 5' 2.5" (1.588 m).   Weight as of 06/30/21: 82 kg.   Imaging Review Imaging: Today I reviewed plain radiographs of her right hip. This was included in her initial evaluation of an AP pelvis and lateral of the right hip. These radiographs did not reveal an obvious lytic lesion and no evidence of acute fracture.  MRI of the right hip was performed on 06/26/2021. These findings include multiple marrow replacing lesions within the proximal femur, within the ischium as well as the inferior rami. Findings per the radiologist are most consistent with that of a metastatic disease. The primary and largest focus which nearly fills the entire medullary space at the femoral neck with endosteal thinning and irregularity most severely over the anterior cortex.  Assessment/Plan:  Impending pathologic femur fracture, right hip(s)  The patient history, physical examination, clinical judgement of the provider and imaging studies are consistent with impending pathologic femur fracture of the right hip(s) and total hip arthroplasty is deemed medically necessary. The treatment options including medical management, injection therapy, arthroscopy and arthroplasty were discussed at length. The risks and benefits of total hip arthroplasty were presented and reviewed. The risks due to aseptic loosening, infection, stiffness, dislocation/subluxation,  thromboembolic complications and other imponderables were discussed.  The patient acknowledged the explanation, agreed to proceed with the plan and consent was signed. Patient is being admitted for inpatient treatment for surgery, pain control, PT, OT, prophylactic antibiotics, VTE prophylaxis, progressive ambulation and ADL's and discharge  planning.The patient is planning to be discharged  home.   Will send femoral head to pathology  Xarelto 10 mg daily x2 weeks, followed by ASA 81 BID x4 weeks Hold off on chemo/radiation if indicated for 2 weeks post op  Home with husband, Merry Proud Tramadol/Oxycodone, methocarbamol, celebrex Hx of stage 1 liver disease, avoid tylenol  Costella Hatcher, PA-C Orthopedic Surgery [facility] [place] [phone]

## 2021-07-03 NOTE — Progress Notes (Signed)
Anesthesia Chart Review   Case: [id number] Date/Time: 07/04/21 1240   Procedure: TOTAL HIP ARTHROPLASTY (Right: Hip) - 90   Anesthesia type: Spinal   Pre-op diagnosis: impending right proximal femur pathologic fracture   Location: [facility] ROOM 09 / [facility]   Surgeons: Paralee Cancel, MD       DISCUSSION:50 y.o. 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 Paralee Cancel.   Low risk stress test 03/07/2021. Last seen by cardiology 03/23/21, stable at this visit with 1 year follow up recommended.   Anticipate pt can proceed with planned procedure barring acute status change.   VS: BP 109/72   Pulse 86   Temp 37.2 C (Oral)   Resp 18   Ht 5' 2.5" (1.588 m)   Wt 82 kg   LMP 08/18/2013   SpO2 100%   BMI 32.54 kg/m   PROVIDERS: [place], Rosalyn Charters, MD is PCP   Berniece Salines, DO, is Cardiologist  LABS: Labs reviewed: Acceptable for surgery. (all labs ordered are listed, but only abnormal results are displayed)  Labs Reviewed  SURGICAL PCR SCREEN - Abnormal; Notable for the following components:      Result Value   Staphylococcus aureus POSITIVE (*)    All other components within normal limits  HEMOGLOBIN A1C - Abnormal; Notable for the following components:   Hgb A1c MFr Bld 6.3 (*)    All other components within normal limits  COMPREHENSIVE METABOLIC PANEL - Abnormal; Notable for the following components:   Glucose, Bld 110 (*)    All other components within normal limits  CBC - Abnormal; Notable for the following components:   RBC 5.16 (*)    All other components within normal limits  GLUCOSE, CAPILLARY - Abnormal; Notable for the following components:   Glucose-Capillary 109 (*)    All other components within normal limits     IMAGES:   EKG: 12/09/2020 Rate 119 bpm  Sinus tachycardia with short PR Anterolateral  infarct, age undetermined   CV: Stress Test 03/07/2021 The left ventricular ejection fraction is mildly decreased (45-54%). Nuclear stress EF: 50%. There was no ST segment deviation noted during stress. The study is normal. This is a low risk study. Fair exercise capacity.  Echo 01/05/2021 1. Left ventricular ejection fraction, by estimation, is 55 to 60%. The  left ventricle has normal function. The left ventricle has no regional  wall motion abnormalities. There is moderate concentric left ventricular  hypertrophy. Left ventricular  diastolic parameters are consistent with Grade I diastolic dysfunction  (impaired relaxation).   2. Right ventricular systolic function is normal. The right ventricular  size is normal. There is normal pulmonary artery systolic pressure.   3. The mitral valve is normal in structure. No evidence of mitral valve  regurgitation. No evidence of mitral stenosis.   4. The aortic valve is tricuspid. Aortic valve regurgitation is not  visualized. No aortic stenosis is present.   5. The inferior vena cava is normal in size with greater than 50%  respiratory variability, suggesting right atrial pressure of 3 mmHg.  Past Medical History:  Diagnosis Date   Achilles tendinitis of left lower extremity 04/22/2018   Acute peptic ulcer without hemorrhage and without perforation 11/29/2020   Acute sinusitis 11/29/2020   Bipolar disorder ([place]) 11/29/2020   Breast cancer ([place])    Cancer ([place])  Cardiac murmur    Chest pain    Chronic fatigue syndrome 11/29/2020   Depression    Essential hypertension 11/29/2020   Hepatic steatosis determined by biopsy of liver 07/04/2020   HLD (hyperlipidemia)    Hypertension    Hypothyroidism 11/29/2020   Insomnia 11/29/2020   Malignant neoplasm of upper-outer quadrant of left female breast ([place]) 07/23/2013   Migraine 11/29/2020   Mild intermittent asthma 11/29/2020   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   Mixed  hyperlipidemia 11/29/2020   Obesity due to excess calories    Osteopenia after menopause 07/04/2020   Other long term (current) drug therapy 11/29/2020   Other vitamin B12 deficiency anemias 11/29/2020   Personal history of malignant neoplasm of breast 11/29/2020   Pre-diabetes    Renal cyst, acquired, right 07/04/2020   14.5 cm in October 2021   Retrocalcaneal bursitis (back of heel), left 04/22/2018   Seasonal allergic rhinitis 11/29/2020   Tightness of heel cord, left 04/22/2018   Type 2 diabetes mellitus without complications ([place]) 12/21/209   Vitamin D deficiency 11/29/2020    Past Surgical History:  Procedure Laterality Date   CESAREAN SECTION     LIVER BIOPSY  01/2019   lumpectomy  2014   Left Breast   PORT-A-CATH REMOVAL     PORTACATH PLACEMENT  08/28/2013   STOMACH SURGERY     Tummy Tuck   WISDOM TOOTH EXTRACTION      MEDICATIONS:  albuterol (VENTOLIN HFA) 108 (90 Base) MCG/ACT inhaler   ARIPiprazole (ABILIFY) 5 MG tablet   atorvastatin (LIPITOR) 20 MG tablet   desvenlafaxine (PRISTIQ) 50 MG 24 hr tablet   FARXIGA 10 MG TABS tablet   fluticasone (FLONASE) 50 MCG/ACT nasal spray   labetalol (NORMODYNE) 200 MG tablet   nitroGLYCERIN (NITROSTAT) 0.4 MG SL tablet   ondansetron (ZOFRAN) 4 MG tablet   tirzepatide (MOUNJARO) 5 MG/0.5ML Pen   topiramate (TOPAMAX) 50 MG tablet   traMADol (ULTRAM) 50 MG tablet   valsartan (DIOVAN) 40 MG tablet   Vitamin D, Ergocalciferol, (DRISDOL) 1.25 MG (50000 UNIT) CAPS capsule    0.9 %  sodium chloride infusion   technetium sestamibi generic (CARDIOLITE) injection 17.3 millicurie     Darrin Koman Ward, PA-C [facility] [phone]

## 2021-07-04 ENCOUNTER — Ambulatory Visit (HOSPITAL_COMMUNITY): Payer: BC Managed Care – PPO | Admitting: Certified Registered Nurse Anesthetist

## 2021-07-04 ENCOUNTER — Ambulatory Visit (HOSPITAL_COMMUNITY): Payer: BC Managed Care – PPO

## 2021-07-04 ENCOUNTER — Ambulatory Visit (HOSPITAL_COMMUNITY): Payer: BC Managed Care – PPO | Admitting: Physician Assistant

## 2021-07-04 ENCOUNTER — Encounter (HOSPITAL_COMMUNITY): Payer: Self-pay | Admitting: Orthopedic Surgery

## 2021-07-04 ENCOUNTER — Other Ambulatory Visit: Payer: Self-pay

## 2021-07-04 ENCOUNTER — Observation Stay (HOSPITAL_COMMUNITY)
Admission: RE | Admit: 2021-07-04 | Discharge: 2021-07-05 | Disposition: A | Discharge status: Home / Self Care | Payer: BC Managed Care – PPO | Attending: Orthopedic Surgery | Admitting: Orthopedic Surgery

## 2021-07-04 ENCOUNTER — Observation Stay (HOSPITAL_COMMUNITY): Payer: BC Managed Care – PPO

## 2021-07-04 ENCOUNTER — Encounter (HOSPITAL_COMMUNITY)
Admission: RE | Disposition: A | Discharge status: Home / Self Care | Payer: Self-pay | Source: Home / Self Care | Attending: Orthopedic Surgery

## 2021-07-04 DIAGNOSIS — I10 Essential (primary) hypertension: Secondary | ICD-10-CM | POA: Insufficient documentation

## 2021-07-04 DIAGNOSIS — E039 Hypothyroidism, unspecified: Secondary | ICD-10-CM | POA: Diagnosis not present

## 2021-07-04 DIAGNOSIS — Z853 Personal history of malignant neoplasm of breast: Secondary | ICD-10-CM | POA: Diagnosis not present

## 2021-07-04 DIAGNOSIS — Z79899 Other long term (current) drug therapy: Secondary | ICD-10-CM | POA: Diagnosis not present

## 2021-07-04 DIAGNOSIS — M84651A Pathological fracture in other disease, right femur, initial encounter for fracture: Secondary | ICD-10-CM | POA: Diagnosis not present

## 2021-07-04 DIAGNOSIS — S72041A Displaced fracture of base of neck of right femur, initial encounter for closed fracture: Secondary | ICD-10-CM

## 2021-07-04 DIAGNOSIS — M85861 Other specified disorders of bone density and structure, right lower leg: Secondary | ICD-10-CM | POA: Insufficient documentation

## 2021-07-04 DIAGNOSIS — C7951 Secondary malignant neoplasm of bone: Secondary | ICD-10-CM

## 2021-07-04 DIAGNOSIS — E119 Type 2 diabetes mellitus without complications: Secondary | ICD-10-CM | POA: Insufficient documentation

## 2021-07-04 DIAGNOSIS — Z96641 Presence of right artificial hip joint: Secondary | ICD-10-CM

## 2021-07-04 DIAGNOSIS — J45909 Unspecified asthma, uncomplicated: Secondary | ICD-10-CM | POA: Insufficient documentation

## 2021-07-04 DIAGNOSIS — Z87891 Personal history of nicotine dependence: Secondary | ICD-10-CM | POA: Diagnosis not present

## 2021-07-04 DIAGNOSIS — M84552A Pathological fracture in neoplastic disease, left femur, initial encounter for fracture: Secondary | ICD-10-CM

## 2021-07-04 DIAGNOSIS — Z96649 Presence of unspecified artificial hip joint: Secondary | ICD-10-CM

## 2021-07-04 HISTORY — PX: TOTAL HIP ARTHROPLASTY: SHX124

## 2021-07-04 LAB — TYPE AND SCREEN
ABO/RH(D): O POS
Antibody Screen: NEGATIVE

## 2021-07-04 LAB — GLUCOSE, CAPILLARY
Glucose-Capillary: 107 mg/dL — ABNORMAL HIGH (ref 70–99)
Glucose-Capillary: 93 mg/dL (ref 70–99)

## 2021-07-04 LAB — ABO/RH: ABO/RH(D): O POS

## 2021-07-04 SURGERY — ARTHROPLASTY, HIP, TOTAL,POSTERIOR APPROACH
Anesthesia: Spinal | Site: Hip | Laterality: Right

## 2021-07-04 MED ORDER — TOPIRAMATE 25 MG PO TABS
50.0000 mg | ORAL_TABLET | Freq: Every day | ORAL | Status: DC
  Administered 2021-07-05: 50 mg via ORAL
  Filled 2021-07-04: qty 2

## 2021-07-04 MED ORDER — ALBUTEROL SULFATE HFA 108 (90 BASE) MCG/ACT IN AERS: 2.0000 | INHALATION_SPRAY | Freq: Four times a day (QID) | RESPIRATORY_TRACT | Status: DC | PRN

## 2021-07-04 MED ORDER — TRANEXAMIC ACID-NACL 1000-0.7 MG/100ML-% IV SOLN
1000.0000 mg | INTRAVENOUS | Status: AC
  Administered 2021-07-04: 1000 mg via INTRAVENOUS
  Filled 2021-07-04: qty 100

## 2021-07-04 MED ORDER — DEXAMETHASONE SODIUM PHOSPHATE 10 MG/ML IJ SOLN
INTRAMUSCULAR | Status: AC
  Filled 2021-07-04: qty 1

## 2021-07-04 MED ORDER — ORAL CARE MOUTH RINSE: 15.0000 mL | Freq: Once | OROMUCOSAL | Status: AC

## 2021-07-04 MED ORDER — CEFAZOLIN SODIUM-DEXTROSE 2-4 GM/100ML-% IV SOLN
2.0000 g | Freq: Four times a day (QID) | INTRAVENOUS | Status: AC
  Administered 2021-07-04 – 2021-07-05 (×2): 2 g via INTRAVENOUS
  Filled 2021-07-04 (×2): qty 100

## 2021-07-04 MED ORDER — METOCLOPRAMIDE HCL 5 MG/ML IJ SOLN: 5.0000 mg | Freq: Three times a day (TID) | INTRAMUSCULAR | Status: DC | PRN

## 2021-07-04 MED ORDER — OXYCODONE HCL 5 MG PO TABS: 5.0000 mg | ORAL_TABLET | Freq: Once | ORAL | Status: DC | PRN

## 2021-07-04 MED ORDER — BUPIVACAINE IN DEXTROSE 0.75-8.25 % IT SOLN
INTRATHECAL | Status: DC | PRN
  Administered 2021-07-04: 1.6 mL via INTRATHECAL

## 2021-07-04 MED ORDER — LACTATED RINGERS IV SOLN: INTRAVENOUS | Status: DC

## 2021-07-04 MED ORDER — ONDANSETRON HCL 4 MG/2ML IJ SOLN: 4.0000 mg | Freq: Four times a day (QID) | INTRAMUSCULAR | Status: DC | PRN

## 2021-07-04 MED ORDER — MIDAZOLAM HCL 5 MG/5ML IJ SOLN
INTRAMUSCULAR | Status: DC | PRN
Start: 2021-07-04 — End: 2021-07-04
  Administered 2021-07-04: 2 mg via INTRAVENOUS

## 2021-07-04 MED ORDER — FENTANYL CITRATE PF 50 MCG/ML IJ SOSY: 25.0000 ug | PREFILLED_SYRINGE | INTRAMUSCULAR | Status: DC | PRN

## 2021-07-04 MED ORDER — OXYCODONE HCL 5 MG PO TABS
10.0000 mg | ORAL_TABLET | ORAL | Status: DC | PRN
  Administered 2021-07-04 – 2021-07-05 (×2): 10 mg via ORAL

## 2021-07-04 MED ORDER — CELECOXIB 200 MG PO CAPS
200.0000 mg | ORAL_CAPSULE | Freq: Two times a day (BID) | ORAL | Status: DC
  Administered 2021-07-04 – 2021-07-05 (×2): 200 mg via ORAL
  Filled 2021-07-04 (×2): qty 1

## 2021-07-04 MED ORDER — DIPHENHYDRAMINE HCL 12.5 MG/5ML PO ELIX: 12.5000 mg | ORAL_SOLUTION | ORAL | Status: DC | PRN

## 2021-07-04 MED ORDER — PHENOL 1.4 % MT LIQD: 1.0000 | OROMUCOSAL | Status: DC | PRN

## 2021-07-04 MED ORDER — HYDROMORPHONE HCL 1 MG/ML IJ SOLN
0.5000 mg | INTRAMUSCULAR | Status: DC | PRN
  Administered 2021-07-04 (×2): 0.5 mg via INTRAVENOUS
  Administered 2021-07-04: 1 mg via INTRAVENOUS
  Filled 2021-07-04 (×4): qty 1

## 2021-07-04 MED ORDER — METHOCARBAMOL 500 MG PO TABS
500.0000 mg | ORAL_TABLET | Freq: Four times a day (QID) | ORAL | Status: DC | PRN
  Administered 2021-07-05 (×2): 500 mg via ORAL
  Filled 2021-07-04 (×2): qty 1

## 2021-07-04 MED ORDER — ACETAMINOPHEN 325 MG PO TABS: 325.0000 mg | ORAL_TABLET | ORAL | Status: DC | PRN

## 2021-07-04 MED ORDER — IRBESARTAN 75 MG PO TABS
75.0000 mg | ORAL_TABLET | Freq: Every day | ORAL | Status: DC
  Filled 2021-07-04: qty 1

## 2021-07-04 MED ORDER — DESVENLAFAXINE SUCCINATE ER 50 MG PO TB24
50.0000 mg | ORAL_TABLET | Freq: Every day | ORAL | Status: DC
  Administered 2021-07-05: 50 mg via ORAL
  Filled 2021-07-04: qty 1

## 2021-07-04 MED ORDER — NITROGLYCERIN 0.4 MG SL SUBL: 0.4000 mg | SUBLINGUAL_TABLET | SUBLINGUAL | Status: DC | PRN

## 2021-07-04 MED ORDER — POVIDONE-IODINE 10 % EX SWAB
2.0000 "application " | Freq: Once | CUTANEOUS | Status: AC
  Administered 2021-07-04: 2 via TOPICAL

## 2021-07-04 MED ORDER — MENTHOL 3 MG MT LOZG: 1.0000 | LOZENGE | OROMUCOSAL | Status: DC | PRN

## 2021-07-04 MED ORDER — DOCUSATE SODIUM 100 MG PO CAPS
100.0000 mg | ORAL_CAPSULE | Freq: Two times a day (BID) | ORAL | Status: DC
  Administered 2021-07-04 – 2021-07-05 (×2): 100 mg via ORAL
  Filled 2021-07-04 (×2): qty 1

## 2021-07-04 MED ORDER — ACETAMINOPHEN 160 MG/5ML PO SOLN: 325.0000 mg | ORAL | Status: DC | PRN

## 2021-07-04 MED ORDER — PROPOFOL 1000 MG/100ML IV EMUL
INTRAVENOUS | Status: AC
  Filled 2021-07-04: qty 100

## 2021-07-04 MED ORDER — METOCLOPRAMIDE HCL 5 MG PO TABS: 5.0000 mg | ORAL_TABLET | Freq: Three times a day (TID) | ORAL | Status: DC | PRN

## 2021-07-04 MED ORDER — BISACODYL 10 MG RE SUPP: 10.0000 mg | Freq: Every day | RECTAL | Status: DC | PRN

## 2021-07-04 MED ORDER — METHOCARBAMOL 500 MG IVPB - SIMPLE MED
500.0000 mg | Freq: Four times a day (QID) | INTRAVENOUS | Status: DC | PRN
  Filled 2021-07-04: qty 50

## 2021-07-04 MED ORDER — CHLORHEXIDINE GLUCONATE 0.12 % MT SOLN
15.0000 mL | Freq: Once | OROMUCOSAL | Status: AC
  Administered 2021-07-04: 15 mL via OROMUCOSAL

## 2021-07-04 MED ORDER — RIVAROXABAN 10 MG PO TABS
10.0000 mg | ORAL_TABLET | Freq: Every day | ORAL | Status: DC
  Administered 2021-07-05: 10 mg via ORAL
  Filled 2021-07-04: qty 1

## 2021-07-04 MED ORDER — DAPAGLIFLOZIN PROPANEDIOL 10 MG PO TABS
10.0000 mg | ORAL_TABLET | Freq: Every day | ORAL | Status: DC
  Filled 2021-07-04: qty 1

## 2021-07-04 MED ORDER — PROPOFOL 10 MG/ML IV BOLUS
INTRAVENOUS | Status: DC | PRN
  Administered 2021-07-04 (×2): 10 mg via INTRAVENOUS

## 2021-07-04 MED ORDER — ARIPIPRAZOLE 5 MG PO TABS
2.5000 mg | ORAL_TABLET | Freq: Every day | ORAL | Status: DC
  Administered 2021-07-05: 2.5 mg via ORAL
  Filled 2021-07-04: qty 1

## 2021-07-04 MED ORDER — ONDANSETRON HCL 4 MG PO TABS: 4.0000 mg | ORAL_TABLET | Freq: Four times a day (QID) | ORAL | Status: DC | PRN

## 2021-07-04 MED ORDER — EPHEDRINE 5 MG/ML INJ
INTRAVENOUS | Status: AC
  Filled 2021-07-04: qty 5

## 2021-07-04 MED ORDER — MEPERIDINE HCL 50 MG/ML IJ SOLN: 6.2500 mg | INTRAMUSCULAR | Status: DC | PRN

## 2021-07-04 MED ORDER — PHENYLEPHRINE 40 MCG/ML (10ML) SYRINGE FOR IV PUSH (FOR BLOOD PRESSURE SUPPORT)
PREFILLED_SYRINGE | INTRAVENOUS | Status: DC | PRN
  Administered 2021-07-04: 80 ug via INTRAVENOUS

## 2021-07-04 MED ORDER — ACETAMINOPHEN 325 MG PO TABS: 325.0000 mg | ORAL_TABLET | Freq: Four times a day (QID) | ORAL | Status: DC | PRN

## 2021-07-04 MED ORDER — FENTANYL CITRATE (PF) 100 MCG/2ML IJ SOLN
INTRAMUSCULAR | Status: DC | PRN
  Administered 2021-07-04 (×2): 50 ug via INTRAVENOUS

## 2021-07-04 MED ORDER — ONDANSETRON HCL 4 MG/2ML IJ SOLN: 4.0000 mg | Freq: Once | INTRAMUSCULAR | Status: DC | PRN

## 2021-07-04 MED ORDER — ONDANSETRON HCL 4 MG/2ML IJ SOLN
INTRAMUSCULAR | Status: DC | PRN
  Administered 2021-07-04: 4 mg via INTRAVENOUS

## 2021-07-04 MED ORDER — ONDANSETRON HCL 4 MG/2ML IJ SOLN
INTRAMUSCULAR | Status: AC
  Filled 2021-07-04: qty 2

## 2021-07-04 MED ORDER — KETOROLAC TROMETHAMINE 30 MG/ML IJ SOLN: 30.0000 mg | Freq: Once | INTRAMUSCULAR | Status: DC | PRN

## 2021-07-04 MED ORDER — TRANEXAMIC ACID-NACL 1000-0.7 MG/100ML-% IV SOLN
1000.0000 mg | Freq: Once | INTRAVENOUS | Status: AC
  Administered 2021-07-04: 1000 mg via INTRAVENOUS
  Filled 2021-07-04: qty 100

## 2021-07-04 MED ORDER — MIDAZOLAM HCL 2 MG/2ML IJ SOLN
INTRAMUSCULAR | Status: AC
  Filled 2021-07-04: qty 2

## 2021-07-04 MED ORDER — FLUTICASONE PROPIONATE 50 MCG/ACT NA SUSP
1.0000 | Freq: Every day | NASAL | Status: DC
  Filled 2021-07-04: qty 16

## 2021-07-04 MED ORDER — STERILE WATER FOR IRRIGATION IR SOLN
Status: DC | PRN
  Administered 2021-07-04 (×2): 1000 mL

## 2021-07-04 MED ORDER — PROPOFOL 500 MG/50ML IV EMUL
INTRAVENOUS | Status: DC | PRN
Start: 2021-07-04 — End: 2021-07-04
  Administered 2021-07-04: 75 ug/kg/min via INTRAVENOUS

## 2021-07-04 MED ORDER — ATORVASTATIN CALCIUM 20 MG PO TABS
20.0000 mg | ORAL_TABLET | Freq: Every day | ORAL | Status: DC
  Administered 2021-07-04 – 2021-07-05 (×2): 20 mg via ORAL
  Filled 2021-07-04 (×2): qty 1

## 2021-07-04 MED ORDER — FERROUS SULFATE 325 (65 FE) MG PO TABS
325.0000 mg | ORAL_TABLET | Freq: Three times a day (TID) | ORAL | Status: DC
  Administered 2021-07-05: 325 mg via ORAL
  Filled 2021-07-04: qty 1

## 2021-07-04 MED ORDER — 0.9 % SODIUM CHLORIDE (POUR BTL) OPTIME
TOPICAL | Status: DC | PRN
  Administered 2021-07-04: 1000 mL

## 2021-07-04 MED ORDER — OXYCODONE HCL 5 MG PO TABS
5.0000 mg | ORAL_TABLET | ORAL | Status: DC | PRN
  Administered 2021-07-05 (×2): 10 mg via ORAL
  Filled 2021-07-04 (×4): qty 2

## 2021-07-04 MED ORDER — POLYETHYLENE GLYCOL 3350 17 G PO PACK: 17.0000 g | PACK | Freq: Every day | ORAL | Status: DC | PRN

## 2021-07-04 MED ORDER — OXYCODONE HCL 5 MG/5ML PO SOLN: 5.0000 mg | Freq: Once | ORAL | Status: DC | PRN

## 2021-07-04 MED ORDER — FENTANYL CITRATE (PF) 100 MCG/2ML IJ SOLN
INTRAMUSCULAR | Status: AC
  Filled 2021-07-04: qty 2

## 2021-07-04 MED ORDER — DEXAMETHASONE SODIUM PHOSPHATE 10 MG/ML IJ SOLN
10.0000 mg | Freq: Once | INTRAMUSCULAR | Status: AC
  Administered 2021-07-05: 10 mg via INTRAVENOUS
  Filled 2021-07-04: qty 1

## 2021-07-04 MED ORDER — DEXAMETHASONE SODIUM PHOSPHATE 10 MG/ML IJ SOLN
8.0000 mg | Freq: Once | INTRAMUSCULAR | Status: AC
  Administered 2021-07-04: 10 mg via INTRAVENOUS

## 2021-07-04 MED ORDER — LABETALOL HCL 100 MG PO TABS
200.0000 mg | ORAL_TABLET | Freq: Two times a day (BID) | ORAL | Status: DC
  Administered 2021-07-04: 200 mg via ORAL
  Filled 2021-07-04 (×2): qty 2

## 2021-07-04 MED ORDER — CEFAZOLIN SODIUM-DEXTROSE 2-4 GM/100ML-% IV SOLN
2.0000 g | INTRAVENOUS | Status: AC
  Administered 2021-07-04: 2 g via INTRAVENOUS
  Filled 2021-07-04: qty 100

## 2021-07-04 MED ORDER — SODIUM CHLORIDE 0.9 % IV SOLN: INTRAVENOUS | Status: DC

## 2021-07-04 SURGICAL SUPPLY — 55 items
BAG COUNTER SPONGE SURGICOUNT (BAG) IMPLANT
BAG DECANTER FOR FLEXI CONT (MISCELLANEOUS) ×2 IMPLANT
BAG ZIPLOCK 12X15 (MISCELLANEOUS) ×2 IMPLANT
BALL HIP CERAMIC (Hips) ×1 IMPLANT
BLADE SAW SGTL 11.0X1.19X90.0M (BLADE) IMPLANT
BLADE SAW SGTL 18X1.27X75 (BLADE) ×2 IMPLANT
BLADE SURG SZ10 CARB STEEL (BLADE) ×4 IMPLANT
COVER SURGICAL LIGHT HANDLE (MISCELLANEOUS) ×2 IMPLANT
CUP ACETBLR 48 OD SECTOR II (Hips) ×2 IMPLANT
DERMABOND ADVANCED (GAUZE/BANDAGES/DRESSINGS) ×1
DERMABOND ADVANCED .7 DNX12 (GAUZE/BANDAGES/DRESSINGS) ×1 IMPLANT
DRAPE INCISE IOBAN 85X60 (DRAPES) ×2 IMPLANT
DRAPE ORTHO SPLIT 77X108 STRL (DRAPES) ×4
DRAPE POUCH INSTRU U-SHP 10X18 (DRAPES) ×2 IMPLANT
DRAPE STERI IOBAN 125X83 (DRAPES) ×2 IMPLANT
DRAPE SURG 17X11 SM STRL (DRAPES) ×2 IMPLANT
DRAPE SURG ORHT 6 SPLT 77X108 (DRAPES) ×2 IMPLANT
DRAPE U-SHAPE 47X51 STRL (DRAPES) ×2 IMPLANT
DRESSING AQUACEL AG SP 3.5X10 (GAUZE/BANDAGES/DRESSINGS) ×1 IMPLANT
DRSG AQUACEL AG ADV 3.5X10 (GAUZE/BANDAGES/DRESSINGS) ×2 IMPLANT
DRSG AQUACEL AG SP 3.5X10 (GAUZE/BANDAGES/DRESSINGS) ×2
DURAPREP 26ML APPLICATOR (WOUND CARE) ×2 IMPLANT
ELECT BLADE TIP CTD 4 INCH (ELECTRODE) ×2 IMPLANT
ELECT REM PT RETURN 15FT ADLT (MISCELLANEOUS) ×2 IMPLANT
ELIMINATOR HOLE APEX DEPUY (Hips) ×2 IMPLANT
FACESHIELD WRAPAROUND (MASK) ×8 IMPLANT
GLOVE SURG ENC TEXT LTX SZ7 (GLOVE) IMPLANT
GLOVE SURG UNDER POLY LF SZ7.5 (GLOVE) ×8 IMPLANT
GOWN STRL REUS W/TWL LRG LVL3 (GOWN DISPOSABLE) ×2 IMPLANT
HIP BALL CERAMIC (Hips) ×2 IMPLANT
KIT TURNOVER KIT A (KITS) IMPLANT
MANIFOLD NEPTUNE II (INSTRUMENTS) ×2 IMPLANT
MARKER SKIN DUAL TIP RULER LAB (MISCELLANEOUS) ×2 IMPLANT
NDL SAFETY ECLIPSE 18X1.5 (NEEDLE) ×1 IMPLANT
NEEDLE HYPO 18GX1.5 SHARP (NEEDLE) ×1
NS IRRIG 1000ML POUR BTL (IV SOLUTION) ×2 IMPLANT
PADDING CAST COTTON 6X4 STRL (CAST SUPPLIES) ×2 IMPLANT
PENCIL SMOKE EVACUATOR (MISCELLANEOUS) IMPLANT
PINN ALTRX NEUT ID X OD 32X48 ×2 IMPLANT
PROTECTOR NERVE ULNAR (MISCELLANEOUS) ×2 IMPLANT
SCREW 6.5MMX30MM (Screw) ×2 IMPLANT
STEM FEM ACTIS HIGH SZ3 (Stem) ×2 IMPLANT
SUCTION FRAZIER HANDLE 10FR (MISCELLANEOUS) ×1
SUCTION TUBE FRAZIER 10FR DISP (MISCELLANEOUS) ×1 IMPLANT
SUT MNCRL AB 4-0 PS2 18 (SUTURE) ×2 IMPLANT
SUT STRATAFIX 0 PDS 27 VIOLET (SUTURE) ×2
SUT VIC AB 1 CT1 36 (SUTURE) ×6 IMPLANT
SUT VIC AB 2-0 CT1 27 (SUTURE) ×2
SUT VIC AB 2-0 CT1 TAPERPNT 27 (SUTURE) ×2 IMPLANT
SUTURE STRATFX 0 PDS 27 VIOLET (SUTURE) ×1 IMPLANT
SYR 50ML LL SCALE MARK (SYRINGE) ×2 IMPLANT
TOWEL OR 17X26 10 PK STRL BLUE (TOWEL DISPOSABLE) ×4 IMPLANT
TRAY FOLEY MTR SLVR 16FR STAT (SET/KITS/TRAYS/PACK) ×2 IMPLANT
TUBE SUCTION HIGH CAP CLEAR NV (SUCTIONS) ×2 IMPLANT
WATER STERILE IRR 1000ML POUR (IV SOLUTION) ×2 IMPLANT

## 2021-07-04 NOTE — Anesthesia Procedure Notes (Signed)
Spinal  Patient location during procedure: OR Start time: 07/04/2021 11:17 AM End time: 07/04/2021 11:18 AM Reason for block: surgical anesthesia Staffing Performed: anesthesiologist  Anesthesiologist: Lyn Hollingshead, MD Preanesthetic Checklist Completed: patient identified, IV checked, site marked, risks and benefits discussed, surgical consent, monitors and equipment checked, pre-op evaluation and timeout performed Spinal Block Patient position: sitting Prep: DuraPrep and site prepped and draped Patient monitoring: continuous pulse ox and blood pressure Approach: midline Location: L3-4 Injection technique: single-shot Needle Needle type: Pencan  Needle gauge: 24 G Needle length: 10 cm Needle insertion depth: 6 cm Assessment Sensory level: T8 Events: CSF return and second provider

## 2021-07-04 NOTE — Plan of Care (Signed)
  Problem: Education: Goal: Knowledge of General Education information will improve Description: Including pain rating scale, medication(s)/side effects and non-pharmacologic comfort measures 07/04/2021 1630 by Butler Denmark I, RN Outcome: Progressing 07/04/2021 1425 by Butler Denmark I, RN Outcome: Progressing   Problem: Health Behavior/Discharge Planning: Goal: Ability to manage health-related needs will improve Outcome: Progressing   Problem: Clinical Measurements: Goal: Ability to maintain clinical measurements within normal limits will improve Outcome: Progressing Goal: Cardiovascular complication will be avoided Outcome: Progressing   Problem: Activity: Goal: Risk for activity intolerance will decrease 07/04/2021 1630 by Butler Denmark I, RN Outcome: Progressing 07/04/2021 1425 by Butler Denmark I, RN Outcome: Progressing   Problem: Nutrition: Goal: Adequate nutrition will be maintained 07/04/2021 1630 by Butler Denmark I, RN Outcome: Progressing 07/04/2021 1425 by Butler Denmark I, RN Outcome: Progressing   Problem: Elimination: Goal: Will not experience complications related to bowel motility 07/04/2021 1630 by Butler Denmark I, RN Outcome: Progressing 07/04/2021 1425 by Butler Denmark I, RN Outcome: Progressing   Problem: Pain Managment: Goal: General experience of comfort will improve 07/04/2021 1630 by Butler Denmark I, RN Outcome: Progressing 07/04/2021 1425 by Butler Denmark I, RN Outcome: Progressing   Problem: Education: Goal: Knowledge of the prescribed therapeutic regimen will improve 07/04/2021 1630 by Butler Denmark I, RN Outcome: Progressing 07/04/2021 1425 by Butler Denmark I, RN Outcome: Progressing   Problem: Activity: Goal: Ability to avoid complications of mobility impairment will improve 07/04/2021 1630 by Butler Denmark I, RN Outcome: Progressing 07/04/2021 1425 by Butler Denmark I, RN Outcome: Progressing    Problem: Pain Management: Goal: Pain level will decrease with appropriate interventions Outcome: Progressing

## 2021-07-04 NOTE — Transfer of Care (Signed)
Immediate Anesthesia Transfer of Care Note  Patient: Anna [name]  Procedure(s) Performed: TOTAL HIP ARTHROPLASTY (Right: Hip)  Patient Location: PACU  Anesthesia Type:MAC and Spinal  Level of Consciousness: awake, alert , oriented and patient cooperative  Airway & Oxygen Therapy: Patient Spontanous Breathing and Patient connected to face mask oxygen  Post-op Assessment: Report given to RN and Post -op Vital signs reviewed and stable  Post vital signs: Reviewed and stable  Last Vitals:  Vitals Value Taken Time  BP 104/48 07/04/21 1300  Temp    Pulse 85 07/04/21 1305  Resp 17 07/04/21 1305  SpO2 100 % 07/04/21 1305  Vitals shown include unvalidated device data.  Last Pain:  Vitals:   07/04/21 0931  TempSrc:   PainSc: 8       Patients Stated Pain Goal: 4 (79/89/21 1941)  Complications: No notable events documented.

## 2021-07-04 NOTE — Anesthesia Procedure Notes (Addendum)
Procedure Name: MAC Date/Time: 07/04/2021 11:08 AM Performed by: West Pugh, CRNA Pre-anesthesia Checklist: Patient identified, Emergency Drugs available, Suction available, Patient being monitored and Timeout performed Patient Re-evaluated:Patient Re-evaluated prior to induction Oxygen Delivery Method: Simple face mask Preoxygenation: Pre-oxygenation with 100% oxygen Placement Confirmation: positive ETCO2 Dental Injury: Teeth and Oropharynx as per pre-operative assessment

## 2021-07-04 NOTE — Op Note (Signed)
NAME:  Anna [name]                ACCOUNT NO.: [id number]      MEDICAL RECORD NO.: [id number]      FACILITY:  [facility]      PHYSICIAN:  Mauri Pole  DATE OF BIRTH:  03/25/1971     DATE OF PROCEDURE:  07/04/2021                                 OPERATIVE REPORT         PREOPERATIVE DIAGNOSIS: Right hip impending pathologic fracture.      POSTOPERATIVE DIAGNOSIS:  Right hip impending pathologic fracture.     PROCEDURE:  Right total hip replacement through an anterior approach   utilizing DePuy THR system, component size 48 mm pinnacle cup, a size 32+4 neutral   Altrex liner, a size 3 Hi Actis stem with a 32+5 delta ceramic   ball.      SURGEON:  Pietro Cassis. Alvan Dame, M.D.      ASSISTANT:  Costella Hatcher, PA-C     ANESTHESIA:  Spinal.      SPECIMENS:  None.      COMPLICATIONS:  None.      BLOOD LOSS:  400 cc     DRAINS:  None.      INDICATION OF THE PROCEDURE:  Anna [name] is a 50 y.o. female who had   presented to office for evaluation of right hip pain.  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  She had increasing pain with activities thus diagnostically she has an impending pathologic hip fracture.  Consent was obtained for   benefit of pain relief.  Specific risks of infection, DVT, component   failure, dislocation, neurovascular injury, and need for revision surgery were reviewed in the office. We will plan to send her femoral head for pathologic evaluation during the procedure.     PROCEDURE IN DETAIL:  The patient was brought to operative theater.   Once adequate anesthesia, preoperative antibiotics, 2 gm of Ancef, 1 gm of Tranexamic Acid, and 10 mg of Decadron were administered, the patient was positioned supine on the [employer] table.  Once the patient was safely positioned with adequate padding of boney prominences we predraped out the  hip, and used fluoroscopy to confirm orientation of the pelvis.      The right hip was then prepped and draped from proximal iliac crest to   mid thigh with a shower curtain technique.      Time-out was performed identifying the patient, planned procedure, and the appropriate extremity.     An incision was then made 2 cm lateral to the   anterior superior iliac spine extending over the orientation of the   tensor fascia lata muscle and sharp dissection was carried down to the   fascia of the muscle.      The fascia was then incised.  The muscle belly was identified and swept   laterally and retractor placed along the superior neck.  Following   cauterization of the circumflex vessels and removing some pericapsular   fat, a second cobra retractor was placed on the inferior neck.  A T-capsulotomy was made along the line of the   superior neck to the trochanteric fossa, then extended proximally and  distally.  Tag sutures were placed and the retractors were then placed   intracapsular.  We then identified the trochanteric fossa and   orientation of my neck cut and then made a neck osteotomy with the femur on traction.  The femoral   head was removed without difficulty or complication.  Traction was let   off and retractors were placed posterior and anterior around the   acetabulum.      The labrum and foveal tissue were debrided.  I began reaming with a 43 mm   reamer and reamed up to 47 mm reamer with good bony bed preparation and a 48 mm  cup was chosen.  The final 48 mm Pinnacle cup was then impacted under fluoroscopy to confirm the depth of penetration and orientation with respect to   Abduction and forward flexion.  A screw was placed into the ilium followed by the hole eliminator.  The final   32+4 neutral Altrex liner was impacted with good visualized rim fit.  The cup was positioned anatomically within the acetabular portion of the pelvis.      At this point, the femur was rolled  to 100 degrees.  Further capsule was   released off the inferior aspect of the femoral neck.  I then   released the superior capsule proximally.  With the leg in a neutral position the hook was placed laterally   along the femur under the vastus lateralis origin and elevated manually and then held in position using the hook attachment on the bed.  The leg was then extended and adducted with the leg rolled to 100   degrees of external rotation.  Retractors were placed along the medial calcar and posteriorly over the greater trochanter.  Once the proximal femur was fully   exposed, I used a box osteotome to set orientation.  I then began   broaching with the starting chili pepper broach and passed this by hand and then broached up to 3.  With the 3 broach in place I chose a high offset neck and did several trial reductions.  The offset was appropriate, leg lengths   appeared to be equal best matched with the +5 vs the +1 head ball trial confirmed radiographically.   Given these findings, I went ahead and dislocated the hip, repositioned all   retractors and positioned the right hip in the extended and abducted position.  The final 3 Hi Actis stem was   chosen and it was impacted down to the level of neck cut.  Based on this   and the trial reductions, a final 32+5 delta ceramic ball was chosen and   impacted onto a clean and dry trunnion, and the hip was reduced.  The   hip had been irrigated throughout the case again at this point.  I did   reapproximate the superior capsular leaflet to the anterior leaflet   using #1 Vicryl.  The fascia of the   tensor fascia lata muscle was then reapproximated using #1 Vicryl and #0 Stratafix sutures.  The   remaining wound was closed with 2-0 Vicryl and running 4-0 Monocryl.   The hip was cleaned, dried, and dressed sterilely using Dermabond and   Aquacel dressing.  The patient was then brought   to recovery room in stable condition tolerating the procedure  well.    Costella Hatcher, PA-C was present for the entirety of the case involved from   preoperative positioning, perioperative retractor management, general  facilitation of the case, as well as primary wound closure as assistant.            Pietro Cassis Alvan Dame, M.D.        07/04/2021 10:06 AM

## 2021-07-04 NOTE — Discharge Instructions (Addendum)
INSTRUCTIONS AFTER JOINT REPLACEMENT   Remove items at home which could result in a fall. This includes throw rugs or furniture in walking pathways ICE to the affected joint every three hours while awake for 30 minutes at a time, for at least the first 3-5 days, and then as needed for pain and swelling.  Continue to use ice for pain and swelling. You may notice swelling that will progress down to the foot and ankle.  This is normal after surgery.  Elevate your leg when you are not up walking on it.   Continue to use the breathing machine you got in the hospital (incentive spirometer) which will help keep your temperature down.  It is common for your temperature to cycle up and down following surgery, especially at night when you are not up moving around and exerting yourself.  The breathing machine keeps your lungs expanded and your temperature down.   DIET:  As you were doing prior to hospitalization, we recommend a well-balanced diet.  DRESSING / WOUND CARE / SHOWERING  Keep the surgical dressing until follow up.  The dressing is water proof, so you can shower without any extra covering.  IF THE DRESSING FALLS OFF or the wound gets wet inside, change the dressing with sterile gauze.  Please use good hand washing techniques before changing the dressing.  Do not use any lotions or creams on the incision until instructed by your surgeon.    ACTIVITY  Increase activity slowly as tolerated, but follow the weight bearing instructions below.   No driving for 6 weeks or until further direction given by your physician.  You cannot drive while taking narcotics.  No lifting or carrying greater than 10 lbs. until further directed by your surgeon. Avoid periods of inactivity such as sitting longer than an hour when not asleep. This helps prevent blood clots.  You may return to work once you are authorized by your doctor.     WEIGHT BEARING   Weight bearing as tolerated with assist device (walker, cane,  etc) as directed, use it as long as suggested by your surgeon or therapist, typically at least 4-6 weeks.   EXERCISES  Results after joint replacement surgery are often greatly improved when you follow the exercise, range of motion and muscle strengthening exercises prescribed by your doctor. Safety measures are also important to protect the joint from further injury. Any time any of these exercises cause you to have increased pain or swelling, decrease what you are doing until you are comfortable again and then slowly increase them. If you have problems or questions, call your caregiver or physical therapist for advice.   Rehabilitation is important following a joint replacement. After just a few days of immobilization, the muscles of the leg can become weakened and shrink (atrophy).  These exercises are designed to build up the tone and strength of the thigh and leg muscles and to improve motion. Often times heat used for twenty to thirty minutes before working out will loosen up your tissues and help with improving the range of motion but do not use heat for the first two weeks following surgery (sometimes heat can increase post-operative swelling).   These exercises can be done on a training (exercise) mat, on the floor, on a table or on a bed. Use whatever works the best and is most comfortable for you.    Use music or television while you are exercising so that the exercises are a pleasant break in your  day. This will make your life better with the exercises acting as a break in your routine that you can look forward to.   Perform all exercises about fifteen times, three times per day or as directed.  You should exercise both the operative leg and the other leg as well.  Exercises include:   Quad Sets - Tighten up the muscle on the front of the thigh (Quad) and hold for 5-10 seconds.   Straight Leg Raises - With your knee straight (if you were given a brace, keep it on), lift the leg to 60  degrees, hold for 3 seconds, and slowly lower the leg.  Perform this exercise against resistance later as your leg gets stronger.  Leg Slides: Lying on your back, slowly slide your foot toward your buttocks, bending your knee up off the floor (only go as far as is comfortable). Then slowly slide your foot back down until your leg is flat on the floor again.  Angel Wings: Lying on your back spread your legs to the side as far apart as you can without causing discomfort.  Hamstring Strength:  Lying on your back, push your heel against the floor with your leg straight by tightening up the muscles of your buttocks.  Repeat, but this time bend your knee to a comfortable angle, and push your heel against the floor.  You may put a pillow under the heel to make it more comfortable if necessary.   A rehabilitation program following joint replacement surgery can speed recovery and prevent re-injury in the future due to weakened muscles. Contact your doctor or a physical therapist for more information on knee rehabilitation.    CONSTIPATION  Constipation is defined medically as fewer than three stools per week and severe constipation as less than one stool per week.  Even if you have a regular bowel pattern at home, your normal regimen is likely to be disrupted due to multiple reasons following surgery.  Combination of anesthesia, postoperative narcotics, change in appetite and fluid intake all can affect your bowels.   YOU MUST use at least one of the following options; they are listed in order of increasing strength to get the job done.  They are all available over the counter, and you may need to use some, POSSIBLY even all of these options:    Drink plenty of fluids (prune juice may be helpful) and high fiber foods Colace 100 mg by mouth twice a day  Senokot for constipation as directed and as needed Dulcolax (bisacodyl), take with full glass of water  Miralax (polyethylene glycol) once or twice a day as  needed.  If you have tried all these things and are unable to have a bowel movement in the first 3-4 days after surgery call either your surgeon or your primary doctor.    If you experience loose stools or diarrhea, hold the medications until you stool forms back up.  If your symptoms do not get better within 1 week or if they get worse, check with your doctor.  If you experience "the worst abdominal pain ever" or develop nausea or vomiting, please contact the office immediately for further recommendations for treatment.   ITCHING:  If you experience itching with your medications, try taking only a single pain pill, or even half a pain pill at a time.  You can also use Benadryl over the counter for itching or also to help with sleep.   TED HOSE STOCKINGS:  Use stockings on both  legs until for at least 2 weeks or as directed by physician office. They may be removed at night for sleeping.  MEDICATIONS:  See your medication summary on the "After Visit Summary" that nursing will review with you.  You may have some home medications which will be placed on hold until you complete the course of blood thinner medication.  It is important for you to complete the blood thinner medication as prescribed.  PRECAUTIONS:  If you experience chest pain or shortness of breath - call 911 immediately for transfer to the hospital emergency department.   If you develop a fever greater that 101 F, purulent drainage from wound, increased redness or drainage from wound, foul odor from the wound/dressing, or calf pain - CONTACT YOUR SURGEON.                                                   FOLLOW-UP APPOINTMENTS:  If you do not already have a post-op appointment, please call the office for an appointment to be seen by your surgeon.  Guidelines for how soon to be seen are listed in your "After Visit Summary", but are typically between 1-4 weeks after surgery.  OTHER INSTRUCTIONS:   Knee Replacement:  Do not place pillow  under knee, focus on keeping the knee straight while resting. CPM instructions: 0-90 degrees, 2 hours in the morning, 2 hours in the afternoon, and 2 hours in the evening. Place foam block, curve side up under heel at all times except when in CPM or when walking.  DO NOT modify, tear, cut, or change the foam block in any way.  POST-OPERATIVE OPIOID TAPER INSTRUCTIONS: It is important to wean off of your opioid medication as soon as possible. If you do not need pain medication after your surgery it is ok to stop day one. Opioids include: Codeine, Hydrocodone(Norco, Vicodin), Oxycodone(Percocet, oxycontin) and hydromorphone amongst others.  Long term and even short term use of opiods can cause: Increased pain response Dependence Constipation Depression Respiratory depression And more.  Withdrawal symptoms can include Flu like symptoms Nausea, vomiting And more Techniques to manage these symptoms Hydrate well Eat regular healthy meals Stay active Use relaxation techniques(deep breathing, meditating, yoga) Do Not substitute Alcohol to help with tapering If you have been on opioids for less than two weeks and do not have pain than it is ok to stop all together.  Plan to wean off of opioids This plan should start within one week post op of your joint replacement. Maintain the same interval or time between taking each dose and first decrease the dose.  Cut the total daily intake of opioids by one tablet each day Next start to increase the time between doses. The last dose that should be eliminated is the evening dose.   MAKE SURE YOU:  Understand these instructions.  Get help right away if you are not doing well or get worse.    Thank you for letting us be a part of your medical care team.  It is a privilege we respect greatly.  We hope these instructions will help you stay on track for a fast and full recovery!   Information on my medicine - XARELTO (Rivaroxaban)     Why was  Xarelto prescribed for you? Xarelto was prescribed for you to reduce the risk of blood clots forming  after orthopedic surgery. The medical term for these abnormal blood clots is venous thromboembolism (VTE).  What do you need to know about xarelto ? Take your Xarelto ONCE DAILY at the same time every day. You may take it either with or without food.  If you have difficulty swallowing the tablet whole, you may crush it and mix in applesauce just prior to taking your dose.  Take Xarelto exactly as prescribed by your doctor and DO NOT stop taking Xarelto without talking to the doctor who prescribed the medication.  Stopping without other VTE prevention medication to take the place of Xarelto may increase your risk of developing a clot.  After discharge, you should have regular check-up appointments with your healthcare provider that is prescribing your Xarelto.    What do you do if you miss a dose? If you miss a dose, take it as soon as you remember on the same day then continue your regularly scheduled once daily regimen the next day. Do not take two doses of Xarelto on the same day.   Important Safety Information A possible side effect of Xarelto is bleeding. You should call your healthcare provider right away if you experience any of the following: Bleeding from an injury or your nose that does not stop. Unusual colored urine (red or dark brown) or unusual colored stools (red or black). Unusual bruising for unknown reasons. A serious fall or if you hit your head (even if there is no bleeding).  Some medicines may interact with Xarelto and might increase your risk of bleeding while on Xarelto. To help avoid this, consult your healthcare provider or pharmacist prior to using any new prescription or non-prescription medications, including herbals, vitamins, non-steroidal anti-inflammatory drugs (NSAIDs) and supplements.  This website has more information on Xarelto:  [email or web address]

## 2021-07-04 NOTE — Anesthesia Postprocedure Evaluation (Signed)
Anesthesia Post Note  Patient: Anna [name]  Procedure(s) Performed: TOTAL HIP ARTHROPLASTY (Right: Hip)     Patient location during evaluation: PACU Anesthesia Type: Spinal Level of consciousness: awake Pain management: pain level controlled Vital Signs Assessment: post-procedure vital signs reviewed and stable Respiratory status: spontaneous breathing Cardiovascular status: stable Postop Assessment: no headache, no backache, spinal receding, patient able to bend at knees and no apparent nausea or vomiting Anesthetic complications: no   No notable events documented.  Last Vitals:  Vitals:   07/04/21 1330 07/04/21 1400  BP: 116/71 114/75  Pulse: 79 89  Resp: 15 12  Temp:    SpO2: 92% 93%    Last Pain:  Vitals:   07/04/21 1400  TempSrc:   PainSc: 0-No pain    LLE Motor Response: Purposeful movement (07/04/21 1400) LLE Sensation: Increased (07/04/21 1400) RLE Motor Response: Purposeful movement (07/04/21 1400) RLE Sensation: Increased (07/04/21 1400) L Sensory Level: S1-Sole of foot, small toes (07/04/21 1400) R Sensory Level: S1-Sole of foot, small toes (07/04/21 1400)  Huston Foley

## 2021-07-04 NOTE — Plan of Care (Signed)
  Problem: Education: Goal: Knowledge of General Education information will improve Description: Including pain rating scale, medication(s)/side effects and non-pharmacologic comfort measures Outcome: Progressing   Problem: Health Behavior/Discharge Planning: Goal: Ability to manage health-related needs will improve Outcome: Progressing   Problem: Clinical Measurements: Goal: Ability to maintain clinical measurements within normal limits will improve Outcome: Progressing Goal: Cardiovascular complication will be avoided Outcome: Progressing   Problem: Activity: Goal: Risk for activity intolerance will decrease Outcome: Progressing   Problem: Nutrition: Goal: Adequate nutrition will be maintained Outcome: Progressing   Problem: Elimination: Goal: Will not experience complications related to bowel motility Outcome: Progressing   Problem: Pain Managment: Goal: General experience of comfort will improve Outcome: Progressing   Problem: Education: Goal: Knowledge of the prescribed therapeutic regimen will improve Outcome: Progressing   Problem: Activity: Goal: Ability to avoid complications of mobility impairment will improve Outcome: Progressing   Problem: Pain Management: Goal: Pain level will decrease with appropriate interventions Outcome: Progressing

## 2021-07-04 NOTE — Interval H&P Note (Signed)
History and Physical Interval Note:  07/04/2021 8:48 AM  Anna [name]  has presented today for surgery, with the diagnosis of impending right proximal femur pathologic fracture.  The various methods of treatment have been discussed with the patient and family. After consideration of risks, benefits and other options for treatment, the patient has consented to  Procedure(s) with comments: TOTAL HIP ARTHROPLASTY (Right) - 90 as a surgical intervention.  The patient's history has been reviewed, patient examined, no change in status, stable for surgery.  I have reviewed the patient's chart and labs.  Questions were answered to the patient's satisfaction.     Mauri Pole

## 2021-07-04 NOTE — Anesthesia Preprocedure Evaluation (Signed)
Anesthesia Evaluation  Patient identified by MRN, date of birth, ID band Patient awake    Reviewed: Allergy & Precautions, NPO status , Patient's Chart, lab work & pertinent test results, reviewed documented beta blocker date and time   Airway Mallampati: I  TM Distance: >3 FB Neck ROM: Full    Dental no notable dental hx.    Pulmonary former smoker,    Pulmonary exam normal        Cardiovascular hypertension, Pt. on medications and Pt. on home beta blockers Normal cardiovascular exam     Neuro/Psych    GI/Hepatic   Endo/Other  diabetes, Well Controlled, Type 2  Renal/GU      Musculoskeletal   Abdominal (+) + obese,   Peds  Hematology   Anesthesia Other Findings   Reproductive/Obstetrics                             Anesthesia Physical Anesthesia Plan  ASA: 2  Anesthesia Plan: Spinal   Post-op Pain Management:    Induction:   PONV Risk Score and Plan: 3 and Ondansetron, Midazolam and Treatment may vary due to age or medical condition  Airway Management Planned: Natural Airway, Simple Face Mask and Nasal Cannula  Additional Equipment: None  Intra-op Plan:   Post-operative Plan:   Informed Consent: I have reviewed the patients History and Physical, chart, labs and discussed the procedure including the risks, benefits and alternatives for the proposed anesthesia with the patient or authorized representative who has indicated his/her understanding and acceptance.     Dental advisory given  Plan Discussed with: CRNA  Anesthesia Plan Comments:         Anesthesia Quick Evaluation

## 2021-07-04 NOTE — Evaluation (Signed)
Physical Therapy Evaluation Patient Details Name: Anna [name] MRN: [id number] DOB: 04-May-1971 Today's Date: 07/04/2021  History of Present Illness  Patient is 50 y.o. female s/p Rt THA anterior approach on 07/04/21 with PMH significant for breast cancer, bipolar disorder, depression, HTN, HLD, hypothyroidism, osteopenia.    Clinical Impression  Anna [name] is a 50 y.o. female POD 0 s/p Rt THA. Patient reports independence with mobility at baseline. Patient is now limited by functional impairments (see PT problem list below) and requires min assist for transfers and gait with RW. Patient was able to ambulate ~7 feet with RW and min assist; distance limited by pain. Patient instructed in exercise to facilitate circulation to manage edema and reduce risk of DVT. Patient will benefit from continued skilled PT interventions to address impairments and progress towards PLOF. Acute PT will follow to progress mobility and stair training in preparation for safe discharge home.        Recommendations for follow up therapy are one component of a multi-disciplinary discharge planning process, led by the attending physician.  Recommendations may be updated based on patient status, additional functional criteria and insurance authorization.  Follow Up Recommendations Follow physician's recommendations for discharge plan and follow up therapies    Assistance Recommended at Discharge Intermittent Supervision/Assistance  Functional Status Assessment Patient has had a recent decline in their functional status and demonstrates the ability to make significant improvements in function in a reasonable and predictable amount of time.  Equipment Recommendations  None recommended by PT    Recommendations for Other Services       Precautions / Restrictions Precautions Precautions: Fall Restrictions Weight Bearing Restrictions: No Other Position/Activity Restrictions: WBAT      Mobility  Bed  Mobility Overal bed mobility: Needs Assistance Bed Mobility: Supine to Sit     Supine to sit: Mod assist;HOB elevated     General bed mobility comments: pt required significant encouragement and mod assist provided for Rt LE to move to EOB.    Transfers Overall transfer level: Needs assistance Equipment used: Rolling walker (2 wheels) Transfers: Sit to/from Stand Sit to Stand: Min assist           General transfer comment: cues for safe hand placement for power up, assist to rise and steady once standing. pt with flexed trunk initially but became more upright after standing for ~1 minute.    Ambulation/Gait Ambulation/Gait assistance: Min assist Gait Distance (Feet): 7 Feet Assistive device: Rolling walker (2 wheels) Gait Pattern/deviations: Step-to pattern;Decreased stride length;Decreased weight shift to right Gait velocity: decr     General Gait Details: cues for step pattern leading with Lt due to stiff/tightness on Rt LE. pt reporting improved pain with several steps but distance limited overall due to pain.  Stairs            Wheelchair Mobility    Modified Rankin (Stroke Patients Only)       Balance Overall balance assessment: Needs assistance Sitting-balance support: Feet supported Sitting balance-Leahy Scale: Good     Standing balance support: Reliant on assistive device for balance;During functional activity;Bilateral upper extremity supported Standing balance-Leahy Scale: Poor                               Pertinent Vitals/Pain Pain Assessment: Faces Faces Pain Scale: Hurts even more Pain Location: Rt hip Pain Descriptors / Indicators: Aching;Discomfort;Crying;Grimacing;Guarding Pain Intervention(s): Limited activity within patient's tolerance;Monitored during session;Repositioned;RN gave pain  meds during session;Ice applied    Home Living Family/patient expects to be discharged to:: Private residence Living Arrangements:  Spouse/significant other Available Help at Discharge: Family Type of Home: House Home Access: Stairs to enter Entrance Stairs-Rails: None Entrance Stairs-Number of Steps: 1+1   Home Layout: One level Home Equipment: Conservation officer, nature (2 wheels);Grab bars - tub/shower      Prior Function Prior Level of Function : Independent/Modified Independent             Mobility Comments: pt has need RW over last week due to hip pain ADLs Comments: pt has needed assist over last week for bathing/dressing lower body/feet from spouse due to pain     Hand Dominance   Dominant Hand: Right    Extremity/Trunk Assessment   Upper Extremity Assessment Upper Extremity Assessment: Overall WFL for tasks assessed    Lower Extremity Assessment Lower Extremity Assessment: Overall WFL for tasks assessed;RLE deficits/detail RLE: Unable to fully assess due to pain RLE Sensation: WNL RLE Coordination: WNL    Cervical / Trunk Assessment Cervical / Trunk Assessment: Normal  Communication   Communication: No difficulties  Cognition Arousal/Alertness: Awake/alert Behavior During Therapy: WFL for tasks assessed/performed Overall Cognitive Status: Within Functional Limits for tasks assessed                                          General Comments      Exercises Total Joint Exercises Ankle Circles/Pumps: AROM;Both;20 reps;Seated   Assessment/Plan    PT Assessment Patient needs continued PT services  PT Problem List Decreased strength;Decreased range of motion;Decreased balance;Decreased mobility;Decreased knowledge of use of DME;Decreased knowledge of precautions;Decreased safety awareness;Pain       PT Treatment Interventions DME instruction;Gait training;Stair training;Functional mobility training;Therapeutic activities;Therapeutic exercise;Balance training;Patient/family education    PT Goals (Current goals can be found in the Care Plan section)  Acute Rehab PT  Goals Patient Stated Goal: recover and stop hurting PT Goal Formulation: With patient Time For Goal Achievement: 07/11/21 Potential to Achieve Goals: Good    Frequency 7X/week   Barriers to discharge        Co-evaluation               AM-PAC PT "6 Clicks" Mobility  Outcome Measure Help needed turning from your back to your side while in a flat bed without using bedrails?: A Little Help needed moving from lying on your back to sitting on the side of a flat bed without using bedrails?: A Little Help needed moving to and from a bed to a chair (including a wheelchair)?: A Little Help needed standing up from a chair using your arms (e.g., wheelchair or bedside chair)?: A Little Help needed to walk in hospital room?: A Little Help needed climbing 3-5 steps with a railing? : A Little 6 Click Score: 18    End of Session Equipment Utilized During Treatment: Gait belt Activity Tolerance: Patient tolerated treatment well Patient left: in chair;with call bell/phone within reach;with chair alarm set;with family/visitor present Nurse Communication: Mobility status;Patient requests pain meds PT Visit Diagnosis: Muscle weakness (generalized) (M62.81);Difficulty in walking, not elsewhere classified (R26.2)    Time: 4854-6270 PT Time Calculation (min) (ACUTE ONLY): 31 min   Charges:   PT Evaluation $PT Eval Low Complexity: 1 Low PT Treatments $Gait Training: 8-22 mins        Gwynneth Albright PT, DPT Acute Rehabilitation Services  Office [phone] Pager [phone]   Jacques Navy 07/04/2021, 7:09 PM

## 2021-07-05 ENCOUNTER — Encounter (HOSPITAL_COMMUNITY): Payer: Self-pay | Admitting: Orthopedic Surgery

## 2021-07-05 DIAGNOSIS — M84651A Pathological fracture in other disease, right femur, initial encounter for fracture: Secondary | ICD-10-CM | POA: Diagnosis not present

## 2021-07-05 LAB — BASIC METABOLIC PANEL
Anion gap: 8 (ref 5–15)
BUN: 16 mg/dL (ref 6–20)
CO2: 24 mmol/L (ref 22–32)
Calcium: 9.2 mg/dL (ref 8.9–10.3)
Chloride: 103 mmol/L (ref 98–111)
Creatinine, Ser: 0.71 mg/dL (ref 0.44–1.00)
GFR, Estimated: >60 mL/min (ref >60)
Glucose, Bld: 147 mg/dL — ABNORMAL HIGH (ref 70–99)
Potassium: 4 mmol/L (ref 3.5–5.1)
Sodium: 135 mmol/L (ref 135–145)

## 2021-07-05 LAB — CBC
HCT: 39.1 % (ref 36.0–46.0)
Hemoglobin: 12.2 g/dL (ref 12.0–15.0)
MCH: 27.1 pg (ref 26.0–34.0)
MCHC: 31.2 g/dL (ref 30.0–36.0)
MCV: 86.9 fL (ref 80.0–100.0)
Platelets: 238 10*3/uL (ref 150–400)
RBC: 4.5 MIL/uL (ref 3.87–5.11)
RDW: 14.7 % (ref 11.5–15.5)
WBC: 14.1 10*3/uL — ABNORMAL HIGH (ref 4.0–10.5)
nRBC: 0 % (ref 0.0–0.2)

## 2021-07-05 MED ORDER — METHOCARBAMOL 500 MG PO TABS
500.0000 mg | ORAL_TABLET | Freq: Four times a day (QID) | ORAL | 0 refills | Status: DC | PRN
Start: 2021-07-05 — End: 2021-10-02

## 2021-07-05 MED ORDER — OXYCODONE HCL 5 MG PO TABS
5.0000 mg | ORAL_TABLET | Freq: Four times a day (QID) | ORAL | 0 refills | Status: DC | PRN
Start: 2021-07-05 — End: 2021-07-24

## 2021-07-05 MED ORDER — RIVAROXABAN 10 MG PO TABS: 10.0000 mg | ORAL_TABLET | Freq: Every day | ORAL | 0 refills | Status: DC

## 2021-07-05 MED ORDER — DOCUSATE SODIUM 100 MG PO CAPS: 100.0000 mg | ORAL_CAPSULE | Freq: Two times a day (BID) | ORAL | 0 refills | Status: DC

## 2021-07-05 MED ORDER — POLYETHYLENE GLYCOL 3350 17 G PO PACK: 17.0000 g | PACK | Freq: Every day | ORAL | 0 refills | Status: DC | PRN

## 2021-07-05 MED ORDER — CELECOXIB 200 MG PO CAPS: 200.0000 mg | ORAL_CAPSULE | Freq: Two times a day (BID) | ORAL | 0 refills | Status: DC

## 2021-07-05 NOTE — Progress Notes (Addendum)
Patient ID: Anna [name], female   DOB: [birth year], 50 y.o.   MRN: [id number] Subjective: 1 Day Post-Op Procedure(s) (LRB): TOTAL HIP ARTHROPLASTY (Right)    Patient reports pain as mild to moderate, tolerated the oxycodone fine. No events  Objective:   VITALS:   Vitals:   07/05/21 0151 07/05/21 0629  BP: 106/69 97/65  Pulse: (!) 110 96  Resp: 18 18  Temp: 98.1 F (36.7 C) 98.4 F (36.9 C)  SpO2: 95% 94%    Neurovascular intact Incision: dressing C/D/I - right hip  LABS Recent Labs    07/05/21 0330  HGB 12.2  HCT 39.1  WBC 14.1*  PLT 238    Recent Labs    07/05/21 0330  NA 135  K 4.0  BUN 16  CREATININE 0.71  GLUCOSE 147*    No results for input(s): LABPT, INR in the last 72 hours.   Assessment/Plan: 1 Day Post-Op Procedure(s) (LRB): TOTAL HIP ARTHROPLASTY (Right)   Advance diet Up with therapy  Right hip femoral neck and head sent to pathology - pending RTC in 2 weeks WBAT  Will plan on using Xarelto for DVT prophylaxis due to surgery and higher risk associated with active cancer

## 2021-07-05 NOTE — Plan of Care (Signed)
Plan of care reviewed and discussed with the patient and her husband.

## 2021-07-05 NOTE — Progress Notes (Signed)
Physical Therapy Treatment Patient Details Name: Anna [name] MRN: [id number] DOB: 1970-10-19 Today's Date: 07/05/2021   History of Present Illness Patient is 50 y.o. female s/p Rt THA anterior approach on 07/04/21 with PMH significant for breast cancer, bipolar disorder, depression, HTN, HLD, hypothyroidism, osteopenia.    PT Comments    Patient making good progress with mobility. She continues to require encouragement and re-assurance and pt reports pain decreases with ambulation. Patient was able to ambulate ~200' with guarding from husband and initiated stair mobility to negotiate curb similar to home entrance. Patient completed toilet transfer with cues and supervision only and confidence is improving with transfers. Will follow up for additional session to progress mobility and HEP for safe discharge home.    Recommendations for follow up therapy are one component of a multi-disciplinary discharge planning process, led by the attending physician.  Recommendations may be updated based on patient status, additional functional criteria and insurance authorization.  Follow Up Recommendations  Follow physician's recommendations for discharge plan and follow up therapies     Assistance Recommended at Discharge Intermittent Supervision/Assistance  Equipment Recommendations  None recommended by PT    Recommendations for Other Services       Precautions / Restrictions Precautions Precautions: Fall Restrictions Weight Bearing Restrictions: No Other Position/Activity Restrictions: WBAT     Mobility  Bed Mobility Overal bed mobility: Needs Assistance Bed Mobility: Supine to Sit     Supine to sit: HOB elevated;Min guard     General bed mobility comments: cues for use of belt to assist Rt LE off EOB and pt using bed rail to raise trunk and pivot.    Transfers Overall transfer level: Needs assistance Equipment used: Rolling walker (2 wheels) Transfers: Sit to/from  Stand Sit to Stand: Min guard           General transfer comment: cues for hand plamcement to improve power up from EOB, min guard for safety. cues to use grab bar in bathroom for controlled lowering and rise at toilet, supervision only.    Ambulation/Gait Ambulation/Gait assistance: Min guard;Supervision Gait Distance (Feet): 200 Feet Assistive device: Rolling walker (2 wheels) Gait Pattern/deviations: Step-to pattern;Decreased stride length;Decreased weight shift to right;Step-through pattern Gait velocity: decr     General Gait Details: cues for step to pattern and as pt's pain decreased pt progressed to step through pattern with no LOB noted. pt's husband provided safe guarding throughout with supervision and cues from therapist.   Stairs Stairs: Yes Stairs assistance: Min guard;Supervision Stair Management: No rails;Step to pattern;Forwards;With walker Number of Stairs: 1 General stair comments: cues/demonstration for curb negotiation with RW, cues to sequence "up with good, down with bad" and walker position. No overt LOB noted and pt steady. Pt's husband provided safe guard/assist with cues from therapist.   Wheelchair Mobility    Modified Rankin (Stroke Patients Only)       Balance Overall balance assessment: Needs assistance Sitting-balance support: Feet supported Sitting balance-Leahy Scale: Good     Standing balance support: Reliant on assistive device for balance;During functional activity;Bilateral upper extremity supported Standing balance-Leahy Scale: Fair                              Cognition Arousal/Alertness: Awake/alert Behavior During Therapy: WFL for tasks assessed/performed Overall Cognitive Status: Within Functional Limits for tasks assessed  Exercises      General Comments        Pertinent Vitals/Pain Pain Assessment: 0-10 Pain Score: 6  Pain Location: Rt  hip Pain Descriptors / Indicators: Aching;Discomfort;Crying;Grimacing;Guarding Pain Intervention(s): Limited activity within patient's tolerance;Monitored during session;Premedicated before session;Repositioned    Home Living                          Prior Function            PT Goals (current goals can now be found in the care plan section) Acute Rehab PT Goals Patient Stated Goal: recover and stop hurting PT Goal Formulation: With patient Time For Goal Achievement: 07/11/21 Potential to Achieve Goals: Good Progress towards PT goals: Progressing toward goals    Frequency    7X/week      PT Plan Current plan remains appropriate    Co-evaluation              AM-PAC PT "6 Clicks" Mobility   Outcome Measure  Help needed turning from your back to your side while in a flat bed without using bedrails?: A Little Help needed moving from lying on your back to sitting on the side of a flat bed without using bedrails?: A Little Help needed moving to and from a bed to a chair (including a wheelchair)?: A Little Help needed standing up from a chair using your arms (e.g., wheelchair or bedside chair)?: A Little Help needed to walk in hospital room?: A Little Help needed climbing 3-5 steps with a railing? : A Little 6 Click Score: 18    End of Session Equipment Utilized During Treatment: Gait belt Activity Tolerance: Patient tolerated treatment well Patient left: in chair;with call bell/phone within reach;with chair alarm set;with family/visitor present Nurse Communication: Mobility status;Patient requests pain meds PT Visit Diagnosis: Muscle weakness (generalized) (M62.81);Difficulty in walking, not elsewhere classified (R26.2)     Time: 2446-2863 PT Time Calculation (min) (ACUTE ONLY): 27 min  Charges:  $Gait Training: 23-37 mins                     Verner Mould, DPT Acute Rehabilitation Services Office [phone] Pager [phone]    Jacques Navy 07/05/2021, 8:50 AM

## 2021-07-05 NOTE — TOC Transition Note (Signed)
Transition of Care [facility]) - CM/SW Discharge Note  Patient Details  Name: Anna [name] MRN: [id number] Date of Birth: 1971/04/13  Transition of Care [facility]) CM/SW Contact:  Sherie Don, LCSW Phone Number: 07/05/2021, 9:46 AM  Clinical Narrative: Patient is expected to discharge home after working with PT. CSW met with patient and her husband to confirm discharge plan. Patient will discharge home with a home exercise program (HEP). Patient has a rolling walker and will not need a 3N1, so there are no DME needs at this time. TOC signing off.  Final next level of care: Home/Self Care Barriers to Discharge: No Barriers Identified  Patient Goals and CMS Choice Patient states their goals for this hospitalization and ongoing recovery are:: Discharge home with a HEP Choice offered to / list presented to : NA  Discharge Plan and Services         DME Arranged: N/A DME Agency: NA  Readmission Risk Interventions No flowsheet data found.

## 2021-07-05 NOTE — Progress Notes (Signed)
Physical Therapy Treatment Patient Details Name: Anna [name] MRN: [id number] DOB: April 28, 1971 Today's Date: 07/05/2021   History of Present Illness Patient is 50 y.o. female s/p Rt THA anterior approach on 07/04/21 with PMH significant for breast cancer, bipolar disorder, depression, HTN, HLD, hypothyroidism, osteopenia.    PT Comments    Patient progressing well and demonstrated excellent carryover for safe management of RW with gait and transfers. Pt ambulated short distance in room to reduce stiffness and then returned to supine in bed for HEP review. Pt verbalized/demonstrated understanding of exercises and all questions addressed. Pt is at safe level for discharge home with assist from family. Acute PT will continue to follow and progress during her stay.    Recommendations for follow up therapy are one component of a multi-disciplinary discharge planning process, led by the attending physician.  Recommendations may be updated based on patient status, additional functional criteria and insurance authorization.  Follow Up Recommendations  Follow physician's recommendations for discharge plan and follow up therapies     Assistance Recommended at Discharge Intermittent Supervision/Assistance  Equipment Recommendations  None recommended by PT    Recommendations for Other Services       Precautions / Restrictions Precautions Precautions: Fall Restrictions Weight Bearing Restrictions: No Other Position/Activity Restrictions: WBAT     Mobility  Bed Mobility Overal bed mobility: Needs Assistance Bed Mobility: Sit to Supine       Sit to supine: Min guard;Supervision   General bed mobility comments: pt using belt to assist Rt LE onto bed, guard/supervision for safety.    Transfers Overall transfer level: Needs assistance Equipment used: Rolling walker (2 wheels) Transfers: Sit to/from Stand Sit to Stand: Supervision           General transfer comment: pt  wtih good carryover for hand placement with transfers from recliner and toilet    Ambulation/Gait Ambulation/Gait assistance: Min guard;Supervision Gait Distance (Feet): 30 Feet (15x2) Assistive device: Rolling walker (2 wheels) Gait Pattern/deviations: Step-to pattern;Decreased stride length;Decreased weight shift to right;Step-through pattern Gait velocity: decr     General Gait Details: pt wtih good carrryover for safe management of RW for gait. amb short distance to bathroom and back.   Stairs             Wheelchair Mobility    Modified Rankin (Stroke Patients Only)       Balance Overall balance assessment: Needs assistance Sitting-balance support: Feet supported Sitting balance-Leahy Scale: Good     Standing balance support: Reliant on assistive device for balance;During functional activity;Bilateral upper extremity supported Standing balance-Leahy Scale: Fair                              Cognition Arousal/Alertness: Awake/alert Behavior During Therapy: WFL for tasks assessed/performed Overall Cognitive Status: Within Functional Limits for tasks assessed                                          Exercises Total Joint Exercises Ankle Circles/Pumps: AROM;Both;10 reps;Supine Quad Sets: AROM;Right;Other reps (comment);Supine Short Arc Quad: AROM;Right;Other reps (comment);Supine Heel Slides: AROM;Right;Other reps (comment);Supine Hip ABduction/ADduction: AROM;Right;Other reps (comment);Supine    General Comments        Pertinent Vitals/Pain Pain Assessment: 0-10 Pain Score: 6  Pain Location: Rt hip Pain Descriptors / Indicators: Aching;Discomfort;Crying;Grimacing;Guarding Pain Intervention(s): Limited activity within patient's tolerance;Monitored during session;Repositioned  Home Living                          Prior Function            PT Goals (current goals can now be found in the care plan section)  Acute Rehab PT Goals Patient Stated Goal: recover and stop hurting PT Goal Formulation: With patient Time For Goal Achievement: 07/11/21 Potential to Achieve Goals: Good Progress towards PT goals: Progressing toward goals    Frequency    7X/week      PT Plan Current plan remains appropriate    Co-evaluation              AM-PAC PT "6 Clicks" Mobility   Outcome Measure  Help needed turning from your back to your side while in a flat bed without using bedrails?: A Little Help needed moving from lying on your back to sitting on the side of a flat bed without using bedrails?: A Little Help needed moving to and from a bed to a chair (including a wheelchair)?: A Little Help needed standing up from a chair using your arms (e.g., wheelchair or bedside chair)?: A Little Help needed to walk in hospital room?: A Little Help needed climbing 3-5 steps with a railing? : A Little 6 Click Score: 18    End of Session Equipment Utilized During Treatment: Gait belt Activity Tolerance: Patient tolerated treatment well Patient left: in chair;with call bell/phone within reach;with chair alarm set;with family/visitor present Nurse Communication: Mobility status;Patient requests pain meds PT Visit Diagnosis: Muscle weakness (generalized) (M62.81);Difficulty in walking, not elsewhere classified (R26.2)     Time: 1141-1200 PT Time Calculation (min) (ACUTE ONLY): 19 min  Charges:  $Therapeutic Exercise: 8-22 mins                     Verner Mould, DPT Acute Rehabilitation Services Office [phone] Pager [phone]    Jacques Navy 07/05/2021, 12:07 PM

## 2021-07-13 NOTE — Progress Notes (Addendum)
ADDENDUM:  Her PET scan has been rescheduled for next week. She does need a refill of oxycodone for pain.  I called her 12/13  and reviewed the prognostic profile.  The estrogen receptors are positive at 95% and progesterone receptors positive at 40% but HER 2 is also positive at 3+, which was negative on her original cancer.  Ki-67 is 60%.  I explained the significance of HER 2 positivity and the fact that it significantly changes her treatment plan.  She will need a port and chemotherapy with Taxotere along with HER 2 directed therapy of Herceptin and Perjeta.  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  She will need a baseline ECHO. Since her PET is scheduled for 12/19, I will plan to meet with her 12/23 to discuss the treatment program.  She will have an appointment with Dr. Orlene Erm 12/28 and treatment will start after the first of the year.   [facility]  [address] [place],    [address] [phone]  Clinic Day:  07/20/2021  Referring physician: Bonnita Nasuti, MD  This document serves as a record of services personally performed by Hosie Poisson, MD. It was created on their behalf by Curry,Lauren E, a trained medical scribe. The creation of this record is based on the scribe's personal observations and the provider's statements to them.  CHIEF COMPLAINT:  CC:  Stage II B hormone receptor positive breast cancer  Current Treatment:   Anastrozole 1 mg daily   HISTORY OF PRESENT ILLNESS:  Anna [name] is a 50 y.o. female with a stage IIB (T2 N1a M0) hormone receptor positive left breast cancer diagnosed in December 2014.  She was treated with lumpectomy.  Pathology revealed a 2.1 cm, grade 3, invasive ductal carcinoma with 1 lymph node positive for metastasis.  Lymphovascular invasion was seen.  Estrogen and progesterone receptors were positive and her 2 Neu negative.  Ki 67 was 76%.  She received adjuvant  chemotherapy with 4 cycles of doxorubicin and cyclophosphamide, followed by 12 weeks of weekly paclitaxel.  She then received adjuvant radiation to the left breast.  She was placed on tamoxifen 20 mg daily in October 2015.  She underwent testing for hereditary cancer syndromes with [employer] myRisk Hereditary Cancer panel, which did not reveal any clinically significant mutation or variants of uncertain significance, so no change in management was recommended.  She was found to have fatty infiltration of the liver on a CTA chest in June 2015.  Bone density scan in November 2017 was normal.  Bilateral diagnostic mammogram in November 2018 did not reveal any evidence of malignancy.  When she was seen in May 2019, she had mild elevation of the AST, which was felt to likely be due to medication effects. She had a bilateral diagnostic mammogram in January 2020.  At that time, she reported a lump in the left breast, so an ultrasound was also done.  Bilateral mammogram and left breast ultrasound did not reveal any suspicious findings.  Postsurgical scarring was seen in the left breast.  The area of palpable abnormality in the left breast was overlying the postsurgical scarring.  She was rescheduled in our office in May 2020 and found to have significant increase in her liver transaminases.  Tamoxifen was discontinued.  She was instructed to avoid alcohol and Tylenol.  CT abdomen and pelvis revealed severe hepatic steatosis.  Bone density scan in June 2020 revealed mild osteopenia  with a T-score -1.4 in the spine.  Her last menstruation was in January 2015 and labs confirmed her postmenopausal status.  She was placed on anastrozole 1 mg daily in June 2020.  She was also referred to hepatology and saw Roosevelt Locks, NP, who diagnosed the patient with nonalcoholic fatty liver disease.  She was unsure if this was secondary to tamoxifen, but felt it was unlikely.  Liver biopsy in July 2020 confirmed severe steatosis of 75-80% with  moderate ballooning degeneration.  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  Anna [name] plans to monitor the patient every 6 months and will likely repeat a liver biopsy in 3 years.  The patient has had improvement in the liver transaminases.  Abdominal ultrasound per Dr. Beverley [name] which revealed an enlarging exophytic cyst of the right kidney.  This has been appreciated for many years, on PET imaging from 2014 this measured 10.6 cm, CT in May 2020 this was 12.4 cm, and recent abdominal ultrasound from July measured this at 14.5 cm. MRI done in November confirmed a benign cyst of the right kidney.  She continues to see Dr. Beverley [name] every 6 months.  She has also been seen by dermatology at [facility], with a good report.   INTERVAL HISTORY:  Anna [name] is here for follow up after developing a pathologic femur fracture of the right hip. She began to have pain and functional disability several months ago with rapid worsening. MRI of the right hip from November 14th revealed multiple marrow replacing lesions within the proximal femur within the ischium as well as the inferior rami. These findings were most consistent with metastatic disease. The primary and largest focus nearly fills the entire medullary space at the femoral neck with endosteal thinning and irregularity, most severely over the anterior cortex. She underwent right hip arthroplasty on November 22nd with Dr. Paralee Cancel. Surgical pathology confirmed metastatic carcinoma, consistent with patient's clinical history of primary breast carcinoma. Prognotic profile is still pending. She rates her pain as a 5/10 today from her procedure, but is healing well. She has been using oxycodone 5 mg as needed for pain. She is using a walker. 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 White count is mildly elevated at 11.5, platelets are mildly elevated at 477,000, and hemoglobin is normal.  Chemistries are unremarkable except for mildly elevated liver transaminases, and an alkaline phosphatase of 257. Her  appetite is good, and she has lost nearly 32 pounds since her last visit.  She denies fever, chills or other signs of infection.  She denies nausea, vomiting, bowel issues, or abdominal pain.  She denies sore throat, cough, dyspnea, or chest pain.  REVIEW OF SYSTEMS:  Review of Systems  Constitutional: Negative.  Negative for appetite change, chills, fatigue, fever and unexpected weight change.  HENT:  Negative.    Eyes: Negative.   Respiratory: Negative.  Negative for chest tightness, cough, hemoptysis, shortness of breath and wheezing.   Cardiovascular: Negative.  Negative for chest pain, leg swelling and palpitations.  Gastrointestinal: Negative.  Negative for abdominal distention, abdominal pain, blood in stool, constipation, diarrhea, nausea and vomiting.  Endocrine: Negative.   Genitourinary: Negative.  Negative for difficulty urinating, dysuria, frequency and hematuria.   Musculoskeletal:  Positive for back pain (and right hip pain from recent surgery) and gait problem (using a walker). Negative for arthralgias, flank pain and myalgias.  Skin: Negative.   Neurological:  Positive for gait problem (using a walker) and numbness (of the left hand/arm, grade 1). Negative for dizziness, extremity weakness, headaches, light-headedness, seizures and speech difficulty.  Hematological: Negative.   Psychiatric/Behavioral: Negative.  Negative for depression and sleep disturbance. The patient is not nervous/anxious.     VITALS:  Blood pressure 108/67, pulse 98, temperature 99 F (37.2 C), temperature source Oral, resp. rate 16, height 5' 2"  (1.575 m), weight 178 lb 1.6 oz (80.8 kg), last menstrual period 08/18/2013, SpO2 95 %.  Wt Readings from Last 3 Encounters:  07/20/21 178 lb 1.6 oz (80.8 kg)  07/04/21 180 lb 12.4 oz (82 kg)  06/30/21 180 lb 12.8 oz (82 kg)    Body mass index  is 32.57 kg/m.  Performance status (ECOG): 1 - Symptomatic but completely ambulatory  PHYSICAL EXAM:  Physical Exam Constitutional:      General: She is not in acute distress.    Appearance: Normal appearance. She is normal weight.  HENT:     Head: Normocephalic and atraumatic.  Eyes:     General: No scleral icterus.    Extraocular Movements: Extraocular movements intact.     Conjunctiva/sclera: Conjunctivae normal.     Pupils: Pupils are equal, round, and reactive to light.  Cardiovascular:     Rate and Rhythm: Normal rate and regular rhythm.     Pulses: Normal pulses.     Heart sounds: Normal heart sounds. No murmur heard.   No friction rub. No gallop.  Pulmonary:     Effort: Pulmonary effort is normal. No respiratory distress.     Breath sounds: Normal breath sounds.  Chest:     Comments: Generalized radiation changes of the left breast. Well healed incision of the left breast in the upper outer quadrant and left axilla. Firmness in the left axilla. Both breasts are without masses. Abdominal:     General: Bowel sounds are normal. There is no distension.     Palpations: Abdomen is soft. There is no hepatomegaly, splenomegaly or mass.     Tenderness: There is no abdominal tenderness.  Musculoskeletal:        General: Normal range of motion.     Cervical back: Normal range of motion and neck supple.     Right lower leg: No edema.     Left lower leg: No edema.  Lymphadenopathy:     Cervical: No cervical adenopathy.     Upper Body:     Left upper body: Supraclavicular adenopathy (measuring about 2 cm) present.     Comments: Thickening in the infra subclavicular area on the left laterally.   Skin:    General: Skin is warm and dry.     Comments: Healing incision in the right anterior thigh with surrounding edema, but no erythema or heat.  Neurological:     General: No focal deficit present.     Mental Status: She is alert and oriented to person, place, and time. Mental  status is at baseline.  Psychiatric:        Mood and Affect: Mood normal.        Behavior: Behavior normal.        Thought Content: Thought content normal.        Judgment: Judgment normal.   LABS:   CBC Latest Ref Rng & Units 07/20/2021 07/05/2021 06/30/2021  WBC - 11.5 14.1(H) 9.1  Hemoglobin 12.0 - 16.0 12.0 12.2 13.9  Hematocrit 36 - 46 37 39.1 44.5  Platelets 150 - 399 477(A) 238 258  CMP Latest Ref Rng & Units 07/20/2021 07/05/2021 06/30/2021  Glucose 70 - 99 mg/dL - 147(H) 110(H)  BUN 4 - 21 18 16 20   Creatinine 0.5 - 1.1 0.9 0.71 0.87  Sodium 137 - 147 139 135 138  Potassium 3.4 - 5.3 3.8 4.0 3.9  Chloride 99 - 108 104 103 105  CO2 13 - 22 26(A) 24 26  Calcium 8.7 - 10.7 9.8 9.2 9.7  Total Protein 6.5 - 8.1 g/dL - - 7.7  Total Bilirubin 0.3 - 1.2 mg/dL - - 0.7  Alkaline Phos 25 - 125 257(A) - 115  AST 13 - 35 38(A) - 19  ALT 7 - 35 49(A) - 27    STUDIES:  DG Pelvis Portable  Result Date: 07/04/2021 CLINICAL DATA:  Right hip arthroplasty EXAM: PORTABLE PELVIS 1-2 VIEWS COMPARISON:  None. FINDINGS: There is recent right hip arthroplasty. There are pockets of air in the soft tissues adjacent to the right hip. IMPRESSION: Status post right hip arthroplasty. Electronically Signed   By: Elmer Picker M.D.   On: 07/04/2021 13:47   DG C-Arm 1-60 Min-No Report  Result Date: 07/04/2021 Fluoroscopy was utilized by the requesting physician.  No radiographic interpretation.   DG C-Arm 1-60 Min-No Report  Result Date: 07/04/2021 Fluoroscopy was utilized by the requesting physician.  No radiographic interpretation.   DG HIP UNILAT WITH PELVIS 2-3 VIEWS RIGHT  Result Date: 07/04/2021 CLINICAL DATA:  Fluoroscopic assistance for right hip arthroplasty EXAM: DG HIP (WITH OR WITHOUT PELVIS) 2-3V RIGHT COMPARISON:  None. FINDINGS: Fluoroscopic images show right hip arthroplasty. Fluoroscopic time was 13 seconds. Radiation dose is 2.04 mGy. Nine images submitted for review.  IMPRESSION: Fluoroscopic assistance was provided for right hip arthroplasty. Electronically Signed   By: Elmer Picker M.D.   On: 07/04/2021 12:45      HISTORY:   Allergies: No Known Allergies  Current Medications: Current Outpatient Medications  Medication Sig Dispense Refill   albuterol (VENTOLIN HFA) 108 (90 Base) MCG/ACT inhaler Inhale 2 puffs into the lungs every 6 (six) hours as needed for shortness of breath.     ARIPiprazole (ABILIFY) 5 MG tablet Take 2.5 mg by mouth daily.     aspirin 81 MG EC tablet Take 81 mg by mouth daily. Swallow whole.     atorvastatin (LIPITOR) 20 MG tablet Take 1 tablet (20 mg total) by mouth daily. 90 tablet 3   celecoxib (CELEBREX) 200 MG capsule Take 1 capsule (200 mg total) by mouth 2 (two) times daily. To start this after finishing Xarelto 60 capsule 0   desvenlafaxine (PRISTIQ) 50 MG 24 hr tablet Take 50 mg by mouth daily.     docusate sodium (COLACE) 100 MG capsule Take 1 capsule (100 mg total) by mouth 2 (two) times daily. 10 capsule 0   FARXIGA 10 MG TABS tablet Take 1 tablet (10 mg total) by mouth daily. 90 tablet 3   fluticasone (FLONASE) 50 MCG/ACT nasal spray Place 1 spray into both nostrils daily.     labetalol (NORMODYNE) 200 MG tablet Take 1 tablet (200 mg total) by mouth 2 (two) times daily. 180 tablet 3   methocarbamol (ROBAXIN) 500 MG tablet Take 1 tablet (500 mg total) by mouth every 6 (six) hours as needed for muscle spasms. 40 tablet 0   nitroGLYCERIN (NITROSTAT) 0.4 MG SL tablet Place 0.4 mg under the tongue every 5 (five) minutes as needed for chest pain.     ondansetron (ZOFRAN) 4 MG tablet Take 4  mg by mouth daily as needed for nausea.     oxyCODONE (OXY IR/ROXICODONE) 5 MG immediate release tablet Take 1-2 tablets (5-10 mg total) by mouth every 6 (six) hours as needed for severe pain. 42 tablet 0   polyethylene glycol (MIRALAX / GLYCOLAX) 17 g packet Take 17 g by mouth daily as needed for mild constipation. 14 each 0    tirzepatide (MOUNJARO) 7.5 MG/0.5ML Pen See admin instructions.     topiramate (TOPAMAX) 50 MG tablet Take 50 mg by mouth daily.     valsartan (DIOVAN) 40 MG tablet Take 1 tablet (40 mg total) by mouth daily. 90 tablet 3   Vitamin D, Ergocalciferol, (DRISDOL) 1.25 MG (50000 UNIT) CAPS capsule Take 50,000 Units by mouth once a week.     Current Facility-Administered Medications  Medication Dose Route Frequency Provider Last Rate Last Admin   0.9 %  sodium chloride infusion  500 mL Intravenous Once Jackquline Denmark, MD       technetium sestamibi generic (CARDIOLITE) injection 49.1 millicurie  79.1 millicurie Intravenous Once PRN Revankar, Reita Cliche, MD         ASSESSMENT & PLAN:   Assessment:  1. 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 She was on adjuvant hormonal therapy with tamoxifen 20 mg daily from October 2015 to May 2020, but due to elevation of the liver transaminases was switched to anastrazole.   2. Severe hepatic steatosis with elevation of the liver transaminases, nearly normalized now.  She continues to follow with hepatology, Dr. Beverley [name] every 6 months who has recommended annual ultrasounds of the liver.  3. Borderline osteopenia of the spine.  She is on high-dose vitamin-D, and I advised her to take calcium daily. We will be scheduling her for current bone density.  4. Exophytic cyst, present for many years which had slightly enlarged.   MRI confirmed this to be a benign cyst.   5. New bone metastases, November 2022, with multiple marrow replacing lesions within the proximal right femur within the ischium as well as the inferior rami. 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 Prognostic profile is still pending. We will stop the anastrazole while we formulate a treatment plan.  Plan:    Recent pathology has confirmed metastatic disease to the bone. Prognostic  profile is pending. Anastrazole has been stopped. We will schedule her for PET imaging. Treatment options were reviewed today including radiation, chemotherapy, and if her estrogen receptors are positive, we could pursue hormonal therapy with oral chemotherapy.  The potential schedules and toxicities were reviewed today, and prior to initiation, she will meet with [facility] for education. Genomic testing will be pursued to determine if she qualifies for any targeted therapy. We will plan to refer her to Dr. Orlene Erm for radiation consultation. As she now has metastatic disease to the bone, I do recommend bone strengthening medicine with monthly Xgeva. We will plan to obtain repeat bone density in the near future. She knows to start oral calcium 600 mg daily, and continue oral vitamin D. The patient and her husband understand the plans discussed today and are in agreement with them.  She knows to contact our office if she develops concerns prior to her next appointment.  I provided 45 minutes of face-to-face time during this this encounter and > 50% was spent counseling as documented under my assessment and plan.    I, Rita Ohara, am acting as  scribe for Derwood Kaplan, MD  I have reviewed this report as typed by the medical scribe, and it is complete and accurate.

## 2021-07-18 NOTE — Discharge Summary (Signed)
Physician Discharge Summary   Patient ID: Anna [name] MRN: [id number] DOB/AGE: Mar 29, 1971 50 y.o.  Admit date: 07/04/2021 Discharge date: 07/05/2021  Primary Diagnosis: Right hip impending pathologic fracture.  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[place]) 07/23/2013   Migraine 11/29/2020   Mild intermittent asthma 11/29/2020   Mild recurrent major depression ([place]) 11/29/2020   Mixed hyperlipidemia 11/29/2020   Obesity due to excess calories    Osteopenia after menopause 07/04/2020   Other long term (current) drug therapy 11/29/2020   Other vitamin B12 deficiency anemias 11/29/2020   Personal history of malignant neoplasm of breast 11/29/2020   Pre-diabetes    Renal cyst, acquired, right 07/04/2020   14.5 cm in October 2021   Retrocalcaneal bursitis (back of heel), left 04/22/2018   Seasonal allergic rhinitis 11/29/2020   Tightness of heel cord, left 04/22/2018   Type 2 diabetes mellitus without complications ([place]) 7/32/2025   Vitamin D deficiency 11/29/2020   Discharge Diagnoses:   Principal Problem:   S/P total right hip arthroplasty  Estimated body mass index is 33.06 kg/m as calculated from the following:   Height as of this encounter: 5' 2" (1.575 m).   Weight as of this encounter: 82 kg.  Procedure:  Procedure(s) (LRB): TOTAL HIP ARTHROPLASTY (Right)   Consults: None  HPI:   Anna [name] is a 50 y.o. female who had   presented to office for evaluation of right hip pain.  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  She had increasing pain with activities thus diagnostically she has an impending pathologic hip fracture.  Consent was obtained for   benefit of pain relief.  Specific risks of infection, DVT, component   failure, dislocation, neurovascular injury, and need for revision surgery were reviewed in the office. We will plan to send her femoral head for pathologic evaluation during the procedure.  Laboratory Data: Admission on 07/04/2021, Discharged on 07/05/2021  Component Date Value Ref Range Status   ABO/RH(D) 07/04/2021 O POS   Final   Antibody Screen 07/04/2021 NEG   Final   Sample Expiration 07/04/2021    Final                   Value:07/07/2021,2359 Performed at [facility], [place] [address] [place], [place] [address]    ABO/RH(D) 07/04/2021    Final                   Value:O POS Performed at [facility], [place] [address] [place], [place] [address]    Glucose-Capillary 07/04/2021 107 (H)  70 - 99 mg/dL Final   Glucose reference range applies only to samples taken after fasting for at least 8 hours.   SURGICAL PATHOLOGY 07/04/2021    Final-Edited  Value:SURGICAL PATHOLOGY CASE: [id number] PATIENT: [facility] Surgical Pathology Report     Clinical History: Impending right proximal femur pathologic fracture (crm)     FINAL MICROSCOPIC DIAGNOSIS:  A. FEMORAL HEAD AND NECK, RIGHT, RESECTION: - Metastatic carcinoma, consistent with patient's clinical history of primary breast carcinoma.  See comment      COMMENT:  A breast prognostic profile (ER, PR, Ki-67 and HER2) is pending and will be reported in an addendum.     GROSS DESCRIPTION:  Received fresh is a 5.2 x  4.7 x 4.5 cm femoral head which includes a 1.5 cm portion of femoral neck.  The margin is uniform.  The articular surface is smooth and tan.  The cut surface is solid, tan-yellow with a 2 x 1.5 x 1.2 cm tan-yellow lesion present at the margin.  There are separate portions of rubbery, tan-yellow, focally calcified tissue measuring 3.5 x 3 x 1.7 cm in aggregate.  Sections are submitted in 3 cassettes. 1 = section of separate tissue                          2 = decalcified section of lesion at femoral neck margin 3 = decalcified section of femoral head with inked femoral neck margin (GRP 07/05/2021)   Final Diagnosis performed by Jaquita Folds, MD.   Electronically signed 07/07/2021 Technical and / or Professional components performed at [facility], [place] [address] [place], [place] [address].  Immunohistochemistry Technical component (if applicable) was performed at [facility]. [address], [place], [place], [place] [address].   IMMUNOHISTOCHEMISTRY DISCLAIMER (if applicable): Some of these immunohistochemical stains may have been developed and the performance characteristics determine by [facility] Some may not have been cleared or approved by the U.S. Food and Drug Administration. The FDA has determined that such clearance or approval is not necessary. This test is used for clinical purposes. It should not be regarded as investigational or for research. This l                         aboratory is certified under the [place] (CLIA-88) as qualified to perform high complexity clinical laboratory testing.  The controls stained appropriately.    Glucose-Capillary 07/04/2021 93  70 - 99 mg/dL Final   Glucose reference range applies only to samples taken after fasting for at least 8 hours.   WBC 07/05/2021 14.1 (H)  4.0 - 10.5 K/uL Final   RBC 07/05/2021 4.50  3.87 - 5.11 MIL/uL Final    Hemoglobin 07/05/2021 12.2  12.0 - 15.0 g/dL Final   HCT 07/05/2021 39.1  36.0 - 46.0 % Final   MCV 07/05/2021 86.9  80.0 - 100.0 fL Final   MCH 07/05/2021 27.1  26.0 - 34.0 pg Final   MCHC 07/05/2021 31.2  30.0 - 36.0 g/dL Final   RDW 07/05/2021 14.7  11.5 - 15.5 % Final   Platelets 07/05/2021 238  150 - 400 K/uL Final   nRBC 07/05/2021 0.0  0.0 - 0.2 % Final   Performed at [facility], [place] [address], [place], [place] [address]   Sodium 07/05/2021 135  135 - 145 mmol/L Final   Potassium 07/05/2021 4.0  3.5 - 5.1 mmol/L Final   Chloride 07/05/2021 103  98 - 111 mmol/L Final   CO2 07/05/2021 24  22 - 32 mmol/L Final   Glucose,  Bld 07/05/2021 147 (H)  70 - 99 mg/dL Final   Glucose reference range applies only to samples taken after fasting for at least 8 hours.   BUN 07/05/2021 16  6 - 20 mg/dL Final   Creatinine, Ser 07/05/2021 0.71  0.44 - 1.00 mg/dL Final   Calcium 07/05/2021 9.2  8.9 - 10.3 mg/dL Final   GFR, Estimated 07/05/2021 >60  >60 mL/min Final   Comment: (NOTE) Calculated using the CKD-EPI Creatinine Equation (2021)    Anion gap 07/05/2021 8  5 - 15 Final   Performed at [facility], [place] [address] [place], [place] [address]  Hospital Outpatient Visit on 06/30/2021  Component Date Value Ref Range Status   MRSA, PCR 06/30/2021 NEGATIVE  NEGATIVE Final   Staphylococcus aureus 06/30/2021 POSITIVE (A)  NEGATIVE Final   Comment: (NOTE) The Xpert SA Assay (FDA approved for NASAL specimens in patients 65 years of age and older), is one component of a comprehensive surveillance program. It is not intended to diagnose infection nor to guide or monitor treatment. Performed at [facility], [place] [address] [place], [place] [address]    Hgb A1c MFr Bld 06/30/2021 6.3 (H)  4.8 - 5.6 % Final   Comment: (NOTE) Pre diabetes:          5.7%-6.4%  Diabetes:              >6.4%  Glycemic control for   <7.0% adults with  diabetes    Mean Plasma Glucose 06/30/2021 134.11  mg/dL Final   Performed at [facility], [place] [address], [place], [place] [address]   Sodium 06/30/2021 138  135 - 145 mmol/L Final   Potassium 06/30/2021 3.9  3.5 - 5.1 mmol/L Final   Chloride 06/30/2021 105  98 - 111 mmol/L Final   CO2 06/30/2021 26  22 - 32 mmol/L Final   Glucose, Bld 06/30/2021 110 (H)  70 - 99 mg/dL Final   Glucose reference range applies only to samples taken after fasting for at least 8 hours.   BUN 06/30/2021 20  6 - 20 mg/dL Final   Creatinine, Ser 06/30/2021 0.87  0.44 - 1.00 mg/dL Final   Calcium 06/30/2021 9.7  8.9 - 10.3 mg/dL Final   Total Protein 06/30/2021 7.7  6.5 - 8.1 g/dL Final   Albumin 06/30/2021 4.5  3.5 - 5.0 g/dL Final   AST 06/30/2021 19  15 - 41 U/L Final   ALT 06/30/2021 27  0 - 44 U/L Final   Alkaline Phosphatase 06/30/2021 115  38 - 126 U/L Final   Total Bilirubin 06/30/2021 0.7  0.3 - 1.2 mg/dL Final   GFR, Estimated 06/30/2021 >60  >60 mL/min Final   Comment: (NOTE) Calculated using the CKD-EPI Creatinine Equation (2021)    Anion gap 06/30/2021 7  5 - 15 Final   Performed at [facility], [place] [address] [place], [place] [address]   WBC 06/30/2021 9.1  4.0 - 10.5 K/uL Final   RBC 06/30/2021 5.16 (H)  3.87 - 5.11 MIL/uL Final   Hemoglobin 06/30/2021 13.9  12.0 - 15.0 g/dL Final   HCT 06/30/2021 44.5  36.0 - 46.0 % Final   MCV 06/30/2021 86.2  80.0 - 100.0 fL Final   MCH 06/30/2021 26.9  26.0 - 34.0 pg Final   MCHC 06/30/2021 31.2  30.0 - 36.0 g/dL Final   RDW 06/30/2021 15.2  11.5 - 15.5 % Final   Platelets 06/30/2021 258  150 - 400 K/uL Final   nRBC 06/30/2021 0.0  0.0 - 0.2 % Final   Performed at [facility], [place] [address] [place], [place] [address]   Glucose-Capillary 06/30/2021 109 (H)  70 - 99 mg/dL Final   Glucose reference range applies only to samples taken after fasting for at least 8 hours.  Orders Only on 06/30/2021   Component Date Value Ref Range Status   SARS Coronavirus 2 06/30/2021 RESULT: NEGATIVE   Final   Comment: RESULT: NEGATIVESARS-CoV-2 INTERPRETATION:A NEGATIVE  test result means that SARS-CoV-2 RNA was not present in the specimen above the limit of detection of this test. This does not preclude a possible SARS-CoV-2 infection and should not be used as the  sole basis for patient management decisions. Negative results must be combined with clinical observations, patient history, and epidemiological information. Optimum specimen types and timing for peak viral levels during infections caused by SARS-CoV-2  have not been determined. Collection of multiple specimens or types of specimens may be necessary to detect virus. Improper specimen collection and handling, sequence variability under primers/probes, or organism present below the limit of detection may  lead to false negative results. Positive and negative predictive values of testing are highly dependent on prevalence. False negative test results are more likely when prevalence of disease is high.The expected result is NEGATIVE.Fact S                          heet for  Healthcare Providers: LocalChronicle.no Sheet for Patients: SalonLookup.es Reference Range - Negative      X-Rays:DG Pelvis Portable  Result Date: 07/04/2021 CLINICAL DATA:  Right hip arthroplasty EXAM: PORTABLE PELVIS 1-2 VIEWS COMPARISON:  None. FINDINGS: There is recent right hip arthroplasty. There are pockets of air in the soft tissues adjacent to the right hip. IMPRESSION: Status post right hip arthroplasty. Electronically Signed   By: Elmer Picker M.D.   On: 07/04/2021 13:47   DG C-Arm 1-60 Min-No Report  Result Date: 07/04/2021 Fluoroscopy was utilized by the requesting physician.  No radiographic interpretation.   DG C-Arm 1-60 Min-No Report  Result Date: 07/04/2021 Fluoroscopy was utilized by the  requesting physician.  No radiographic interpretation.   DG HIP UNILAT WITH PELVIS 2-3 VIEWS RIGHT  Result Date: 07/04/2021 CLINICAL DATA:  Fluoroscopic assistance for right hip arthroplasty EXAM: DG HIP (WITH OR WITHOUT PELVIS) 2-3V RIGHT COMPARISON:  None. FINDINGS: Fluoroscopic images show right hip arthroplasty. Fluoroscopic time was 13 seconds. Radiation dose is 2.04 mGy. Nine images submitted for review. IMPRESSION: Fluoroscopic assistance was provided for right hip arthroplasty. Electronically Signed   By: Elmer Picker M.D.   On: 07/04/2021 12:45    EKG: Orders placed or performed in visit on 12/09/20   EKG 12-Lead     Hospital Course: Anna [name] is a 50 y.o. who was admitted to [facility]. They were brought to the operating room on 07/04/2021 and underwent Procedure(s): TOTAL HIP ARTHROPLASTY.  Patient tolerated the procedure well and was later transferred to the recovery room and then to the orthopaedic floor for postoperative care. They were given PO and IV analgesics for pain control following their surgery. They were given 24 hours of postoperative antibiotics of  Anti-infectives (From admission, onward)    Start     Dose/Rate Route Frequency Ordered Stop   07/04/21 1800  ceFAZolin (ANCEF) IVPB 2g/100 mL premix        2 g 200  mL/hr over 30 Minutes Intravenous Every 6 hours 07/04/21 1457 07/05/21 0042   07/04/21 0845  ceFAZolin (ANCEF) IVPB 2g/100 mL premix        2 g 200 mL/hr over 30 Minutes Intravenous On call to O.R. 07/04/21 5427 07/04/21 1149      and started on DVT prophylaxis in the form of Xarelto.   PT and OT were ordered for total joint protocol. Discharge planning consulted to help with postop disposition and equipment needs.  Patient had a good night on the evening of surgery. They started to get up OOB with therapy on POD #0. Pt was seen during rounds and was ready to go home pending progress with therapy.She worked with therapy on POD #1  and was meeting her goals. Pt was discharged to home later that day in stable condition.  Diet: Regular diet Activity: WBAT Follow-up: in 2 weeks Disposition: Home Discharged Condition: good   Discharge Instructions     Call MD / Call 911   Complete by: As directed    If you experience chest pain or shortness of breath, CALL 911 and be transported to the hospital emergency room.  If you develope a fever above 101 F, pus (white drainage) or increased drainage or redness at the wound, or calf pain, call your surgeon's office.   Change dressing   Complete by: As directed    Maintain surgical dressing until follow up in the clinic. If the edges start to pull up, may reinforce with tape. If the dressing is no longer working, may remove and cover with gauze and tape, but must keep the area dry and clean.  Call with any questions or concerns.   Constipation Prevention   Complete by: As directed    Drink plenty of fluids.  Prune juice may be helpful.  You may use a stool softener, such as Colace (over the counter) 100 mg twice a day.  Use MiraLax (over the counter) for constipation as needed.   Diet - low sodium heart healthy   Complete by: As directed    Increase activity slowly as tolerated   Complete by: As directed    Weight bearing as tolerated with assist device (walker, cane, etc) as directed, use it as long as suggested by your surgeon or therapist, typically at least 4-6 weeks.   Post-operative opioid taper instructions:   Complete by: As directed    POST-OPERATIVE OPIOID TAPER INSTRUCTIONS: It is important to wean off of your opioid medication as soon as possible. If you do not need pain medication after your surgery it is ok to stop day one. Opioids include: Codeine, Hydrocodone(Norco, Vicodin), Oxycodone(Percocet, oxycontin) and hydromorphone amongst others.  Long term and even short term use of opiods can cause: Increased pain  response Dependence Constipation Depression Respiratory depression And more.  Withdrawal symptoms can include Flu like symptoms Nausea, vomiting And more Techniques to manage these symptoms Hydrate well Eat regular healthy meals Stay active Use relaxation techniques(deep breathing, meditating, yoga) Do Not substitute Alcohol to help with tapering If you have been on opioids for less than two weeks and do not have pain than it is ok to stop all together.  Plan to wean off of opioids This plan should start within one week post op of your joint replacement. Maintain the same interval or time between taking each dose and first decrease the dose.  Cut the total daily intake of opioids by one tablet each day Next start to increase the time  between doses. The last dose that should be eliminated is the evening dose.      TED hose   Complete by: As directed    Use stockings (TED hose) for 2 weeks on both leg(s).  You may remove them at night for sleeping.      Allergies as of 07/05/2021   No Known Allergies      Medication List     STOP taking these medications    traMADol 50 MG tablet Commonly known as: ULTRAM       TAKE these medications    albuterol 108 (90 Base) MCG/ACT inhaler Commonly known as: VENTOLIN HFA Inhale 2 puffs into the lungs every 6 (six) hours as needed for shortness of breath.   ARIPiprazole 5 MG tablet Commonly known as: ABILIFY Take 2.5 mg by mouth daily.   atorvastatin 20 MG tablet Commonly known as: LIPITOR Take 1 tablet (20 mg total) by mouth daily.   celecoxib 200 MG capsule Commonly known as: CELEBREX Take 1 capsule (200 mg total) by mouth 2 (two) times daily. To start this after finishing Xarelto   desvenlafaxine 50 MG 24 hr tablet Commonly known as: PRISTIQ Take 50 mg by mouth daily.   docusate sodium 100 MG capsule Commonly known as: COLACE Take 1 capsule (100 mg total) by mouth 2 (two) times daily.   Farxiga 10 MG Tabs  tablet Generic drug: dapagliflozin propanediol Take 1 tablet (10 mg total) by mouth daily.   fluticasone 50 MCG/ACT nasal spray Commonly known as: FLONASE Place 1 spray into both nostrils daily.   labetalol 200 MG tablet Commonly known as: NORMODYNE Take 1 tablet (200 mg total) by mouth 2 (two) times daily.   methocarbamol 500 MG tablet Commonly known as: ROBAXIN Take 1 tablet (500 mg total) by mouth every 6 (six) hours as needed for muscle spasms.   Mounjaro 5 MG/0.5ML Pen Generic drug: tirzepatide Inject 2.5 mg into the skin every Thursday.   nitroGLYCERIN 0.4 MG SL tablet Commonly known as: NITROSTAT Place 0.4 mg under the tongue every 5 (five) minutes as needed for chest pain.   ondansetron 4 MG tablet Commonly known as: ZOFRAN Take 4 mg by mouth daily as needed for nausea.   oxyCODONE 5 MG immediate release tablet Commonly known as: Oxy IR/ROXICODONE Take 1-2 tablets (5-10 mg total) by mouth every 6 (six) hours as needed for severe pain.   polyethylene glycol 17 g packet Commonly known as: MIRALAX / GLYCOLAX Take 17 g by mouth daily as needed for mild constipation.   rivaroxaban 10 MG Tabs tablet Commonly known as: XARELTO Take 1 tablet (10 mg total) by mouth daily with breakfast for 14 days. Then take aspirin 81 mg twice daily for 4 weeks   topiramate 50 MG tablet Commonly known as: TOPAMAX Take 50 mg by mouth daily.   valsartan 40 MG tablet Commonly known as: Diovan Take 1 tablet (40 mg total) by mouth daily.   Vitamin D (Ergocalciferol) 1.25 MG (50000 UNIT) Caps capsule Commonly known as: DRISDOL Take 50,000 Units by mouth once a week.               Discharge Care Instructions  (From admission, onward)           Start     Ordered   07/05/21 0000  Change dressing       Comments: Maintain surgical dressing until follow up in the clinic. If the edges start to pull up, may reinforce with tape.  If the dressing is no longer working, may remove  and cover with gauze and tape, but must keep the area dry and clean.  Call with any questions or concerns.   07/05/21 0831            Follow-up Information     Paralee Cancel, MD. Schedule an appointment as soon as possible for a visit in 2 week(s).   Specialty: Orthopedic Surgery Contact information: [address] [place] [address] [phone]                 Signed: Griffith Citron, PA-C Orthopedic Surgery 07/18/2021, 4:29 PM

## 2021-07-20 ENCOUNTER — Inpatient Hospital Stay: Payer: BC Managed Care – PPO

## 2021-07-20 ENCOUNTER — Other Ambulatory Visit: Payer: Self-pay | Admitting: Hematology and Oncology

## 2021-07-20 ENCOUNTER — Telehealth: Payer: Self-pay | Admitting: Oncology

## 2021-07-20 ENCOUNTER — Other Ambulatory Visit: Payer: Self-pay | Admitting: Oncology

## 2021-07-20 ENCOUNTER — Inpatient Hospital Stay: Payer: BC Managed Care – PPO | Attending: Oncology | Admitting: Oncology

## 2021-07-20 ENCOUNTER — Encounter: Payer: Self-pay | Admitting: Oncology

## 2021-07-20 VITALS — BP 108/67 | HR 98 | Temp 99.0°F | Resp 16 | Ht 62.0 in | Wt 178.1 lb

## 2021-07-20 DIAGNOSIS — M8580 Other specified disorders of bone density and structure, unspecified site: Secondary | ICD-10-CM

## 2021-07-20 DIAGNOSIS — C7951 Secondary malignant neoplasm of bone: Secondary | ICD-10-CM | POA: Insufficient documentation

## 2021-07-20 DIAGNOSIS — C50912 Malignant neoplasm of unspecified site of left female breast: Secondary | ICD-10-CM | POA: Insufficient documentation

## 2021-07-20 DIAGNOSIS — Z780 Asymptomatic menopausal state: Secondary | ICD-10-CM

## 2021-07-20 DIAGNOSIS — Z170 Estrogen receptor positive status [ER+]: Secondary | ICD-10-CM | POA: Insufficient documentation

## 2021-07-20 DIAGNOSIS — C50412 Malignant neoplasm of upper-outer quadrant of left female breast: Secondary | ICD-10-CM | POA: Diagnosis not present

## 2021-07-20 LAB — BASIC METABOLIC PANEL
BUN: 18 (ref 4–21)
CO2: 26 — AB (ref 13–22)
Chloride: 104 (ref 99–108)
Creatinine: 0.9 (ref 0.5–1.1)
Glucose: 97
Potassium: 3.8 (ref 3.4–5.3)
Sodium: 139 (ref 137–147)

## 2021-07-20 LAB — SURGICAL PATHOLOGY

## 2021-07-20 LAB — COMPREHENSIVE METABOLIC PANEL
Albumin: 4.1 (ref 3.5–5.0)
Calcium: 9.8 (ref 8.7–10.7)

## 2021-07-20 LAB — HEPATIC FUNCTION PANEL
ALT: 49 — AB (ref 7–35)
AST: 38 — AB (ref 13–35)
Alkaline Phosphatase: 257 — AB (ref 25–125)
Bilirubin, Total: 0.6

## 2021-07-20 LAB — CBC AND DIFFERENTIAL
HCT: 37 (ref 36–46)
Hemoglobin: 12 (ref 12.0–16.0)
Neutrophils Absolute: 7.48
Platelets: 477 — AB (ref 150–399)
WBC: 11.5

## 2021-07-20 LAB — CBC: RBC: 4.52 (ref 3.87–5.11)

## 2021-07-21 ENCOUNTER — Other Ambulatory Visit: Payer: Self-pay | Admitting: Pharmacist

## 2021-07-21 LAB — CANCER ANTIGEN 27.29: CA 27.29: 75.6 U/mL — ABNORMAL HIGH (ref 0.0–38.6)

## 2021-07-22 LAB — CEA: CEA: 2.2 ng/mL (ref 0.0–4.7)

## 2021-07-24 ENCOUNTER — Other Ambulatory Visit: Payer: Self-pay | Admitting: Oncology

## 2021-07-24 DIAGNOSIS — C7951 Secondary malignant neoplasm of bone: Secondary | ICD-10-CM

## 2021-07-24 MED ORDER — OXYCODONE HCL 5 MG PO TABS: 5.0000 mg | ORAL_TABLET | ORAL | 0 refills | Status: DC | PRN

## 2021-07-26 ENCOUNTER — Other Ambulatory Visit: Payer: Self-pay | Admitting: Oncology

## 2021-07-26 DIAGNOSIS — C50412 Malignant neoplasm of upper-outer quadrant of left female breast: Secondary | ICD-10-CM

## 2021-07-27 ENCOUNTER — Telehealth: Payer: Self-pay | Admitting: Oncology

## 2021-07-27 ENCOUNTER — Encounter: Payer: Self-pay | Admitting: Oncology

## 2021-07-27 ENCOUNTER — Other Ambulatory Visit: Payer: Self-pay | Admitting: Oncology

## 2021-07-27 ENCOUNTER — Telehealth: Payer: Self-pay

## 2021-07-27 NOTE — Progress Notes (Unsigned)
Per [facility] provider portal:   The CPT code [phone] entered for this member does not require prior authorization at this time.

## 2021-07-27 NOTE — Telephone Encounter (Signed)
Referral faxed to Dr. Maxie Barb office.

## 2021-07-27 NOTE — Addendum Note (Signed)
Addended by: Belva Chimes on: 07/27/2021 01:35 PM   Modules accepted: Orders

## 2021-07-27 NOTE — Telephone Encounter (Signed)
-----   Message from Derwood Kaplan, MD sent at 07/26/2021  6:42 PM EST ----- Regarding: referral Pls refer to surgeon for port placement, had dr. Pauletta Browns before so any of the 3 we have here now

## 2021-07-27 NOTE — Telephone Encounter (Signed)
12/15/22left msg about upcoming appts

## 2021-07-31 ENCOUNTER — Ambulatory Visit (HOSPITAL_COMMUNITY)
Admission: RE | Admit: 2021-07-31 | Discharge: 2021-07-31 | Disposition: A | Discharge status: Home / Self Care | Payer: BC Managed Care – PPO | Source: Ambulatory Visit | Attending: Oncology | Admitting: Oncology

## 2021-07-31 ENCOUNTER — Telehealth: Payer: Self-pay

## 2021-07-31 DIAGNOSIS — C7951 Secondary malignant neoplasm of bone: Secondary | ICD-10-CM

## 2021-07-31 LAB — GLUCOSE, CAPILLARY: Glucose-Capillary: 107 mg/dL — ABNORMAL HIGH (ref 70–99)

## 2021-07-31 MED ORDER — FLUDEOXYGLUCOSE F - 18 (FDG) INJECTION
9.2000 | Freq: Once | INTRAVENOUS | Status: AC
  Administered 2021-07-31: 07:00:00 9 via INTRAVENOUS

## 2021-07-31 NOTE — Telephone Encounter (Signed)
Valarie Merino from [facility] called with finding of the Nuclear Med study. 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 Accidental findings include marked increase of left hemi-diaphragm.

## 2021-08-01 DIAGNOSIS — I3139 Other pericardial effusion (noninflammatory): Secondary | ICD-10-CM | POA: Diagnosis not present

## 2021-08-02 NOTE — Progress Notes (Signed)
[facility]  [address] [place],  [place]  [address] [phone]  Clinic Day:  08/04/2021  Referring physician: Bonnita Nasuti, MD  This document serves as a record of services personally performed by Hosie Poisson, MD. It was created on their behalf by Curry,Lauren E, a trained medical scribe. The creation of this record is based on the scribe's personal observations and the provider's statements to them.  CHIEF COMPLAINT:  CC:  Stage II B hormone receptor positive breast cancer  Current Treatment:   Will plan for TCHP as well as radiation and hormonal therapy   HISTORY OF PRESENT ILLNESS:  Anna [name] is a 50 y.o. female with a stage IIB (T2 N1a M0) hormone receptor positive left breast cancer diagnosed in December 2014.  She was treated with lumpectomy.  Pathology revealed a 2.1 cm, grade 3, invasive ductal carcinoma with 1 lymph node positive for metastasis.  Lymphovascular invasion was seen.  Estrogen and progesterone receptors were positive and her 2 Neu negative.  Ki 67 was 76%.  She received adjuvant chemotherapy with 4 cycles of doxorubicin and cyclophosphamide, followed by 12 weeks of weekly paclitaxel.  She then received adjuvant radiation to the left breast.  She was placed on tamoxifen 20 mg daily in October 2015.  She underwent testing for hereditary cancer syndromes with [facility] myRisk Hereditary Cancer panel, which did not reveal any clinically significant mutation or variants of uncertain significance, so no change in management was recommended.  She was found to have fatty infiltration of the liver on a CTA chest in June 2015.  Bone density scan in November 2017 was normal.  Bilateral diagnostic mammogram in November 2018 did not reveal any evidence of malignancy.  When she was seen in May 2019, she had mild elevation of the AST, which was felt to likely be due to medication effects. She had a bilateral diagnostic mammogram  in January 2020.  At that time, she reported a lump in the left breast, so an ultrasound was also done.  Bilateral mammogram and left breast ultrasound did not reveal any suspicious findings.  Postsurgical scarring was seen in the left breast.  The area of palpable abnormality in the left breast was overlying the postsurgical scarring.  She was rescheduled in our office in May 2020 and found to have significant increase in her liver transaminases.  Tamoxifen was discontinued.  She was instructed to avoid alcohol and Tylenol.  CT abdomen and pelvis revealed severe hepatic steatosis.  Bone density scan in June 2020 revealed mild osteopenia with a T-score -1.4 in the spine.  Her last menstruation was in January 2015 and labs confirmed her postmenopausal status.  She was placed on anastrozole 1 mg daily in June 2020.  She was also referred to hepatology and saw Roosevelt Locks, NP, who diagnosed the patient with nonalcoholic fatty liver disease.  She was unsure if this was secondary to tamoxifen, but felt it was unlikely.  Liver biopsy in July 2020 confirmed severe steatosis of 75-80% with moderate ballooning degeneration.  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  Ms. Beverley Fiedler plans to monitor the patient every 6 months and will likely repeat a liver biopsy in 3 years.  The patient has had improvement in the liver transaminases.  Abdominal ultrasound per Dr. Beverley Fiedler which revealed an enlarging exophytic cyst of the right kidney.  This has been appreciated for many  years, on PET imaging from 2014 this measured 10.6 cm, CT in May 2020 this was 12.4 cm, and recent abdominal ultrasound from July measured this at 14.5 cm. MRI done in November confirmed a benign cyst of the right kidney.  She continues to see Dr. Beverley Fiedler every 6 months.  She has also been seen by dermatology at [facility], with a good report.   She began to have pain and functional disability several months  ago with rapid worsening. MRI of the right hip from November 14th revealed multiple marrow replacing lesions within the proximal femur within the ischium as well as the inferior rami. These findings were most consistent with metastatic disease and impending fracture. The primary and largest focus nearly fills the entire medullary space at the femoral neck with endosteal thinning and irregularity, most severely over the anterior cortex. She underwent right hip arthroplasty on November 22nd with Dr. Paralee Cancel. Surgical pathology confirmed metastatic carcinoma, consistent with patient's clinical history of primary breast carcinoma. She had severe weight loss. She is using oxycodone 5 mg for her pain.  INTERVAL HISTORY:  Anna [name] is here for routine follow up to finalize a treatment plan. Prognostic profile confirmed estrogen receptor was positive at 95%, and progesterone receptor was positive at 40%. HER2 was positive 3+, which was negative on her original cancer. PET imaging from December 19th revealed dominant finding of intensely hypermetabolic aggressive skeletal metastasis involving the calvarium, sternum, thoracic and lumbar spine, and pelvis. There is potential pathologic fracture of the right femur with internal fixation, and soft tissue extension into the anterior mediastinum associated with the manubrial metastasis. Multiple spinal vertebral body lesions have cortical destruction along the posterior margin. She complains of pressure and discomfort of the sternal area.  She has had a port placed for chemotherapy. She had her baseline ECHO which looks good with an EF of 55-60%. She does report back pain and continues to use a walker for support. Otherwise, she is doing fairly well. Her  appetite is good, and she has gained 8 pounds since her last visit.  She denies fever, chills or other signs of infection.  She denies nausea, vomiting, bowel issues, or abdominal pain.  She denies sore throat, cough,  dyspnea, or chest pain.  REVIEW OF SYSTEMS:  Review of Systems  Constitutional: Negative.  Negative for appetite change, chills, fatigue, fever and unexpected weight change.  HENT:  Negative.    Eyes: Negative.   Respiratory: Negative.  Negative for chest tightness, cough, hemoptysis, shortness of breath and wheezing.   Cardiovascular: Negative.  Negative for chest pain, leg swelling and palpitations.  Gastrointestinal: Negative.  Negative for abdominal distention, abdominal pain, blood in stool, constipation, diarrhea, nausea and vomiting.  Endocrine: Negative.   Genitourinary: Negative.  Negative for difficulty urinating, dysuria, frequency and hematuria.   Musculoskeletal:  Positive for back pain (and right hip pain from recent surgery) and gait problem (using a walker). Negative for arthralgias, flank pain and myalgias.       Discomfort of the sternal area when she lays down  Skin: Negative.   Neurological:  Positive for gait problem (using a walker) and numbness (of the left hand/arm, grade 1). Negative for dizziness, extremity weakness, headaches, light-headedness, seizures and speech difficulty.  Hematological: Negative.   Psychiatric/Behavioral: Negative.  Negative for depression and sleep disturbance. The patient is not nervous/anxious.     VITALS:  Blood pressure (!) 142/74, pulse 96, temperature (!) 97 F (36.1 C), temperature source Oral, resp. rate 16,  height 5' 2"  (1.575 m), weight 186 lb 11.2 oz (84.7 kg), last menstrual period 08/18/2013, SpO2 97 %.  Wt Readings from Last 3 Encounters:  08/04/21 186 lb 11.2 oz (84.7 kg)  07/20/21 178 lb 1.6 oz (80.8 kg)  07/04/21 180 lb 12.4 oz (82 kg)    Body mass index is 34.15 kg/m.  Performance status (ECOG): 1 - Symptomatic but completely ambulatory  PHYSICAL EXAM:  Physical Exam Constitutional:      General: She is not in acute distress.    Appearance: Normal appearance. She is normal weight.  HENT:     Head: Normocephalic  and atraumatic.  Eyes:     General: No scleral icterus.    Extraocular Movements: Extraocular movements intact.     Conjunctiva/sclera: Conjunctivae normal.     Pupils: Pupils are equal, round, and reactive to light.  Cardiovascular:     Rate and Rhythm: Normal rate and regular rhythm.     Pulses: Normal pulses.     Heart sounds: Normal heart sounds. No murmur heard.   No friction rub. No gallop.  Pulmonary:     Effort: Pulmonary effort is normal. No respiratory distress.     Breath sounds: Normal breath sounds.  Abdominal:     General: Bowel sounds are normal. There is no distension.     Palpations: Abdomen is soft. There is no hepatomegaly, splenomegaly or mass.     Tenderness: There is no abdominal tenderness.  Musculoskeletal:        General: Normal range of motion.     Cervical back: Normal range of motion and neck supple.     Right lower leg: No edema.     Left lower leg: No edema.  Lymphadenopathy:     Cervical: No cervical adenopathy.     Comments: Prominent firm swelling of the supraclavicular and subclavicular areas  Skin:    General: Skin is warm and dry.  Neurological:     General: No focal deficit present.     Mental Status: She is alert and oriented to person, place, and time. Mental status is at baseline.  Psychiatric:        Mood and Affect: Mood normal.        Behavior: Behavior normal.        Thought Content: Thought content normal.        Judgment: Judgment normal.   LABS:   CBC Latest Ref Rng & Units 07/20/2021 07/05/2021 06/30/2021  WBC - 11.5 14.1(H) 9.1  Hemoglobin 12.0 - 16.0 12.0 12.2 13.9  Hematocrit 36 - 46 37 39.1 44.5  Platelets 150 - 399 477(A) 238 258   CMP Latest Ref Rng & Units 07/20/2021 07/05/2021 06/30/2021  Glucose 70 - 99 mg/dL - 147(H) 110(H)  BUN 4 - 21 18 16 20   Creatinine 0.5 - 1.1 0.9 0.71 0.87  Sodium 137 - 147 139 135 138  Potassium 3.4 - 5.3 3.8 4.0 3.9  Chloride 99 - 108 104 103 105  CO2 13 - 22 26(A) 24 26  Calcium 8.7  - 10.7 9.8 9.2 9.7  Total Protein 6.5 - 8.1 g/dL - - 7.7  Total Bilirubin 0.3 - 1.2 mg/dL - - 0.7  Alkaline Phos 25 - 125 257(A) - 115  AST 13 - 35 38(A) - 19  ALT 7 - 35 49(A) - 27    STUDIES:  NM PET Image Initial (PI) Whole Body  Result Date: 07/31/2021 CLINICAL DATA:  Subsequent treatment strategy for breast carcinoma. Skeletal  metastasis. EXAM: NUCLEAR MEDICINE PET WHOLE BODY TECHNIQUE: Sub mCi F-18 FDG was injected intravenously. Full-ring PET imaging was performed from the head to foot after the radiotracer. CT data was obtained and used for attenuation correction and anatomic localization. Fasting blood glucose: 107 mg/dl COMPARISON:  None. FINDINGS: Mediastinal blood pool activity: SUV max 2.0 HEAD/NECK: No hypermetabolic lymph nodes in the neck. Hypermetabolic activity associated with the lytic lesions in the high calvarium over the LEFT and RIGHT cerebral convexity. Example lytic lesion measuring 1 cm on image 10 with SUV max equal 9.0. Incidental CT findings: none CHEST: Extensive expansile hypermetabolic mass involving the manubrium and sternum. Soft tissue extension into the anterior mediastinum measures 3.7 cm on image 83/CT series 4 with SUV max equal 11.2. Hypermetabolic activity involving the entirety of the sternum with SUV max equal 9.4. There is cortical destruction associated with the metabolic activity. No hypermetabolic mediastinal lymph nodes. No suspicious pulmonary nodules. Incidental CT findings: Marked elevation of the LEFT hemidiaphragm with associated LEFT lobe atelectasis. ABDOMEN/PELVIS: No abnormal activity in liver. Adrenal glands normal. No hypermetabolic abdominopelvic lymph nodes. Uterus and adnexa are normal. 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 Example expansile lucent lesion in LEFT sacrum measuring 3.5 cm with SUV max equal 15.1. Intense metabolic activity associated with recent RIGHT hip  replacement. Potential pathologic fracture. Intense activity associated with the LEFT and RIGHT inferior pubic rami with SUV max equal 11.8 on image 190) Multiple hypermetabolic lesions in the thoracic lumbar spine. There is evidence cortical destruction along the posterior margin of several vertebral bodies. For example lesions at T3-T5. Concern sternal manubrial metastasis with soft tissue extension described in chest section Incidental CT findings: none EXTREMITIES: No abnormal hypermetabolic activity in the lower extremities. Incidental CT findings: none IMPRESSION: 1. 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 2. Potential pathologic fracture of the RIGHT femur with internal fixation. 3. Soft tissue extension into the anterior mediastinum associated with the manubrial metastasis. 4. Multiple spinal vertebral body lesions have cortical destruction along the posterior margin. 5. Incidental finding of marked elevation of the LEFT hemidiaphragm. These results will be called to the ordering clinician or representative by the Radiologist Assistant, and communication documented in the PACS or [employer]. Electronically Signed   By: Suzy Bouchard M.D.   On: 07/31/2021 12:00      HISTORY:   Allergies: No Known Allergies  Current Medications: Current Outpatient Medications  Medication Sig Dispense Refill   albuterol (VENTOLIN HFA) 108 (90 Base) MCG/ACT inhaler Inhale 2 puffs into the lungs every 6 (six) hours as needed for shortness of breath.     ARIPiprazole (ABILIFY) 5 MG tablet Take 2.5 mg by mouth daily.     aspirin 81 MG EC tablet Take 81 mg by mouth daily. Swallow whole.     atorvastatin (LIPITOR) 20 MG tablet Take 1 tablet (20 mg total) by mouth daily. 90 tablet 3   celecoxib (CELEBREX) 200 MG capsule Take 1 capsule (200 mg total) by mouth 2 (two) times daily. To start this after  finishing Xarelto 60 capsule 0   desvenlafaxine (PRISTIQ) 50 MG 24 hr tablet Take 50 mg by mouth daily.     docusate sodium (COLACE) 100 MG capsule Take 1 capsule (100 mg total) by mouth 2 (two) times daily. 10 capsule 0   FARXIGA 10 MG TABS tablet Take 1 tablet (10 mg total)  by mouth daily. 90 tablet 3   fluticasone (FLONASE) 50 MCG/ACT nasal spray Place 1 spray into both nostrils daily.     labetalol (NORMODYNE) 200 MG tablet Take 1 tablet (200 mg total) by mouth 2 (two) times daily. 180 tablet 3   methocarbamol (ROBAXIN) 500 MG tablet Take 1 tablet (500 mg total) by mouth every 6 (six) hours as needed for muscle spasms. 40 tablet 0   nitroGLYCERIN (NITROSTAT) 0.4 MG SL tablet Place 0.4 mg under the tongue every 5 (five) minutes as needed for chest pain.     ondansetron (ZOFRAN) 4 MG tablet Take 4 mg by mouth daily as needed for nausea.     oxyCODONE (OXY IR/ROXICODONE) 5 MG immediate release tablet Take 1-2 tablets (5-10 mg total) by mouth every 4 (four) hours as needed for severe pain. 120 tablet 0   polyethylene glycol (MIRALAX / GLYCOLAX) 17 g packet Take 17 g by mouth daily as needed for mild constipation. 14 each 0   tirzepatide (MOUNJARO) 7.5 MG/0.5ML Pen See admin instructions.     topiramate (TOPAMAX) 50 MG tablet Take 50 mg by mouth daily.     valsartan (DIOVAN) 40 MG tablet Take 1 tablet (40 mg total) by mouth daily. 90 tablet 3   Vitamin D, Ergocalciferol, (DRISDOL) 1.25 MG (50000 UNIT) CAPS capsule Take 50,000 Units by mouth once a week.     Current Facility-Administered Medications  Medication Dose Route Frequency Provider Last Rate Last Admin   0.9 %  sodium chloride infusion  500 mL Intravenous Once Jackquline Denmark, MD       technetium sestamibi generic (CARDIOLITE) injection 33.5 millicurie  45.6 millicurie Intravenous Once PRN Revankar, Reita Cliche, MD         ASSESSMENT & PLAN:   Assessment:  1. History of stage IIB hormone receptor positive breast cancer, diagnosed in  December 2014, treated with surgery, chemotherapy and radiation therapy. She was on adjuvant hormonal therapy with tamoxifen 20 mg daily from October 2015 to May 2020, but due to elevation of the liver transaminases was switched to anastrazole.   2. Severe hepatic steatosis with elevation of the liver transaminases, nearly normalized now.  She continues to follow with hepatology, Dr. Beverley Fiedler every 6 months who has recommended annual ultrasounds of the liver.  3. Borderline osteopenia of the spine.  She is on high-dose vitamin-D, and I advised her to take calcium daily. We will be scheduling her for current bone density.  4. Exophytic cyst, present for many years which had slightly enlarged.   MRI confirmed this to be a benign cyst.   5. New bone metastases, November 2022, with multiple marrow replacing lesions within the proximal right femur within the ischium as well as the inferior rami. 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 Ki67 was 60%. PET imaging from December 19th revealed dominant finding of intensely hypermetabolic aggressive skeletal metastasis involving the calvarium, sternum, thoracic and lumbar spine, and pelvis, with potential pathologic fracture of the right femur with internal fixation. There is soft tissue extension into the anterior mediastinum associated with the manubrial metastasis. Multiple spinal vertebral body lesions have cortical destruction along the posterior margin.  We will plan for TCHP, radiation and hormonal therapy as well as denosumab for bone strengthening.  Plan:    Recent pathology has confirmed metastatic disease to the bone. Anastrazole has been stopped. Prognostic profile confirmed estrogen receptor was positive  at 95% and progesterone receptor was positive at 40%. However, HER2 was also positive at 3+, which was negative on her original cancer. Ki67 was 60%. I explained  the significance of HER 2 positivity and the fact that it significantly changes her treatment plan.  She will need chemotherapy with Taxotere/carboplatin along with HER 2 directed therapy of Herceptin and Perjeta. The schedule and potential toxicities were reviewed and we will schedule her for an education session with Melissa prior to initiation. She has an appointment with Dr. Orlene Erm on 12/28 and treatment will start after the first of the year, to the hip/pelvis and likely the mediastinal tumor as well since it is symptomatic. She is scheduled for Xgeva on Wednesday as well, which will be continued monthly. She has already undergone port placement and baseline ECHO is good with an EF between 55-60%. We will plan to obtain repeat bone density in the near future. She will continue oral calcium 600 mg daily, and continue oral vitamin D. The patient and her husband understand the plans discussed today and are in agreement with them.  She knows to contact our office if she develops concerns prior to her next appointment.   I provided 30 minutes of face-to-face time during this this encounter and > 50% was spent counseling as documented under my assessment and plan.    I, Rita Ohara, am acting as scribe for Derwood Kaplan, MD  I have reviewed this report as typed by the medical scribe, and it is complete and accurate.

## 2021-08-04 ENCOUNTER — Other Ambulatory Visit: Payer: Self-pay | Admitting: Hematology and Oncology

## 2021-08-04 ENCOUNTER — Inpatient Hospital Stay (INDEPENDENT_AMBULATORY_CARE_PROVIDER_SITE_OTHER): Payer: BC Managed Care – PPO | Admitting: Oncology

## 2021-08-04 ENCOUNTER — Other Ambulatory Visit: Payer: Self-pay | Admitting: Oncology

## 2021-08-04 ENCOUNTER — Encounter: Payer: Self-pay | Admitting: Oncology

## 2021-08-04 VITALS — BP 142/74 | HR 96 | Temp 97.0°F | Resp 16 | Ht 62.0 in | Wt 186.7 lb

## 2021-08-04 DIAGNOSIS — C7951 Secondary malignant neoplasm of bone: Secondary | ICD-10-CM

## 2021-08-04 DIAGNOSIS — Z170 Estrogen receptor positive status [ER+]: Secondary | ICD-10-CM

## 2021-08-04 DIAGNOSIS — C50412 Malignant neoplasm of upper-outer quadrant of left female breast: Secondary | ICD-10-CM

## 2021-08-04 MED ORDER — ONDANSETRON HCL 4 MG PO TABS: 4.0000 mg | ORAL_TABLET | ORAL | 3 refills | Status: DC | PRN

## 2021-08-04 MED ORDER — OXYCODONE HCL 5 MG PO TABS: 5.0000 mg | ORAL_TABLET | ORAL | 0 refills | Status: DC | PRN

## 2021-08-04 MED ORDER — PROCHLORPERAZINE MALEATE 10 MG PO TABS: 10.0000 mg | ORAL_TABLET | Freq: Four times a day (QID) | ORAL | 3 refills | Status: DC | PRN

## 2021-08-09 ENCOUNTER — Inpatient Hospital Stay (INDEPENDENT_AMBULATORY_CARE_PROVIDER_SITE_OTHER): Payer: BC Managed Care – PPO | Admitting: Hematology and Oncology

## 2021-08-09 ENCOUNTER — Inpatient Hospital Stay: Payer: BC Managed Care – PPO

## 2021-08-09 ENCOUNTER — Other Ambulatory Visit: Payer: Self-pay

## 2021-08-09 VITALS — BP 129/73 | HR 87 | Temp 98.8°F | Resp 18 | Ht 62.0 in | Wt 185.5 lb

## 2021-08-09 VITALS — BP 131/74 | HR 85 | Temp 98.2°F | Resp 18 | Ht 62.0 in | Wt 185.0 lb

## 2021-08-09 DIAGNOSIS — C50912 Malignant neoplasm of unspecified site of left female breast: Secondary | ICD-10-CM | POA: Diagnosis not present

## 2021-08-09 DIAGNOSIS — C50412 Malignant neoplasm of upper-outer quadrant of left female breast: Secondary | ICD-10-CM | POA: Diagnosis not present

## 2021-08-09 DIAGNOSIS — Z170 Estrogen receptor positive status [ER+]: Secondary | ICD-10-CM | POA: Diagnosis not present

## 2021-08-09 DIAGNOSIS — C7951 Secondary malignant neoplasm of bone: Secondary | ICD-10-CM

## 2021-08-09 MED ORDER — PROCHLORPERAZINE MALEATE 10 MG PO TABS: 10.0000 mg | ORAL_TABLET | Freq: Four times a day (QID) | ORAL | 3 refills | Status: DC | PRN

## 2021-08-09 MED ORDER — LORATADINE 10 MG PO CAPS: 10.0000 mg | ORAL_CAPSULE | Freq: Every day | ORAL | 6 refills | Status: DC

## 2021-08-09 MED ORDER — DENOSUMAB 120 MG/1.7ML ~~LOC~~ SOLN
120.0000 mg | Freq: Once | SUBCUTANEOUS | Status: AC
  Administered 2021-08-09: 120 mg via SUBCUTANEOUS
  Filled 2021-08-09: qty 1.7

## 2021-08-09 MED ORDER — ONDANSETRON HCL 4 MG PO TABS: 4.0000 mg | ORAL_TABLET | ORAL | 3 refills | Status: DC | PRN

## 2021-08-09 MED ORDER — DEXAMETHASONE 4 MG PO TABS: ORAL_TABLET | ORAL | 0 refills | Status: DC

## 2021-08-09 NOTE — Addendum Note (Signed)
Addended byGeorgette Shell on: 08/09/2021 01:49 PM   Modules accepted: Orders

## 2021-08-09 NOTE — Patient Instructions (Signed)
Denosumab injection What is this medication? DENOSUMAB (den oh sue mab) slows bone breakdown. Prolia is used to treat osteoporosis in women after menopause and in men, and in people who are taking corticosteroids for 6 months or more. Delton See is used to treat a high calcium level due to cancer and to prevent bone fractures and other bone problems caused by multiple myeloma or cancer bone metastases. Delton See is also used to treat giant cell tumor of the bone. This medicine may be used for other purposes; ask your health care provider or pharmacist if you have questions. COMMON BRAND NAME(S): Prolia, XGEVA What should I tell my care team before I take this medication? They need to know if you have any of these conditions: dental disease having surgery or tooth extraction infection kidney disease low levels of calcium or Vitamin D in the blood malnutrition on hemodialysis skin conditions or sensitivity thyroid or parathyroid disease an unusual reaction to denosumab, other medicines, foods, dyes, or preservatives pregnant or trying to get pregnant breast-feeding How should I use this medication? This medicine is for injection under the skin. It is given by a health care professional in a hospital or clinic setting. A special MedGuide will be given to you before each treatment. Be sure to read this information carefully each time. For Prolia, talk to your pediatrician regarding the use of this medicine in children. Special care may be needed. For Delton See, talk to your pediatrician regarding the use of this medicine in children. While this drug may be prescribed for children as young as 13 years for selected conditions, precautions do apply. Overdosage: If you think you have taken too much of this medicine contact a poison control center or emergency room at once. NOTE: This medicine is only for you. Do not share this medicine with others. What if I miss a dose? It is important not to miss your dose.  Call your doctor or health care professional if you are unable to keep an appointment. What may interact with this medication? Do not take this medicine with any of the following medications: other medicines containing denosumab This medicine may also interact with the following medications: medicines that lower your chance of fighting infection steroid medicines like prednisone or cortisone This list may not describe all possible interactions. Give your health care provider a list of all the medicines, herbs, non-prescription drugs, or dietary supplements you use. Also tell them if you smoke, drink alcohol, or use illegal drugs. Some items may interact with your medicine. What should I watch for while using this medication? Visit your doctor or health care professional for regular checks on your progress. Your doctor or health care professional may order blood tests and other tests to see how you are doing. Call your doctor or health care professional for advice if you get a fever, chills or sore throat, or other symptoms of a cold or flu. Do not treat yourself. This drug may decrease your body's ability to fight infection. Try to avoid being around people who are sick. You should make sure you get enough calcium and vitamin D while you are taking this medicine, unless your doctor tells you not to. Discuss the foods you eat and the vitamins you take with your health care professional. See your dentist regularly. Brush and floss your teeth as directed. Before you have any dental work done, tell your dentist you are receiving this medicine. Do not become pregnant while taking this medicine or for 5 months after  stopping it. Talk with your doctor or health care professional about your birth control options while taking this medicine. Women should inform their doctor if they wish to become pregnant or think they might be pregnant. There is a potential for serious side effects to an unborn child. Talk to  your health care professional or pharmacist for more information. What side effects may I notice from receiving this medication? Side effects that you should report to your doctor or health care professional as soon as possible: allergic reactions like skin rash, itching or hives, swelling of the face, lips, or tongue bone pain breathing problems dizziness jaw pain, especially after dental work redness, blistering, peeling of the skin signs and symptoms of infection like fever or chills; cough; sore throat; pain or trouble passing urine signs of low calcium like fast heartbeat, muscle cramps or muscle pain; pain, tingling, numbness in the hands or feet; seizures unusual bleeding or bruising unusually weak or tired Side effects that usually do not require medical attention (report to your doctor or health care professional if they continue or are bothersome): constipation diarrhea headache joint pain loss of appetite muscle pain runny nose tiredness upset stomach This list may not describe all possible side effects. Call your doctor for medical advice about side effects. You may report side effects to FDA at 1-800-FDA-1088. Where should I keep my medication? This medicine is only given in a clinic, doctor's office, or other health care setting and will not be stored at home. NOTE: This sheet is a summary. It may not cover all possible information. If you have questions about this medicine, talk to your doctor, pharmacist, or health care provider.  2022 [employer] (2017-12-06 00:00:00)

## 2021-08-09 NOTE — Progress Notes (Signed)
[place] NOTE  Patient Care Team: Hague, Rosalyn Charters, MD as PCP - General (Internal Medicine) Berniece Salines, DO as PCP - Cardiology (Cardiology)   Name of the patient: Anna [name]  [id number]  12-14-1970   Date of visit: 08/09/21  Diagnosis- Breast cancer  Chief complaint/Reason for visit- Initial Meeting for [facility], preparing for starting chemotherapy    Heme/Onc history:  Oncology History  Malignant neoplasm of upper-outer quadrant of left female breast ([place])  07/23/2013 Initial Diagnosis   Malignant neoplasm of upper-outer quadrant of left female breast ([place])   07/23/2013 Cancer Staging   Staging form: Breast, AJCC 7th Edition - Clinical stage from 07/23/2013: Stage IIB (T2, N1, M0) - Signed by Derwood Kaplan, MD on 07/04/2020 Prognostic indicators: Pos LVI      Interval history-  Patient presents to chemo care clinic today for initial meeting in preparation for starting chemotherapy. I introduced the chemo care clinic and we discussed that the role of the clinic is to assist those who are at an increased risk of emergency room visits and/or complications during the course of chemotherapy treatment. We discussed that the increased risk takes into account factors such as age, performance status, and co-morbidities. We also discussed that for some, this might include barriers to care such as not having a primary care provider, lack of insurance/transportation, or not being able to afford medications. We discussed that the goal of the program is to help prevent unplanned ER visits and help reduce complications during chemotherapy. We do this by discussing specific risk factors to each individual and identifying ways that we can help improve these risk factors and reduce barriers to care.   No Known Allergies  Past Medical History:  Diagnosis Date   Achilles tendinitis of left lower extremity 04/22/2018   Acute peptic ulcer without hemorrhage  and without perforation 11/29/2020   Acute sinusitis 11/29/2020   Bipolar disorder ([place]) 11/29/2020   Breast cancer ([place])    Cancer ([place])    Cardiac murmur    Chest pain    Chronic fatigue syndrome 11/29/2020   Depression    Essential hypertension 11/29/2020   Hepatic steatosis determined by biopsy of liver 07/04/2020   HLD (hyperlipidemia)    Hypertension    Hypothyroidism 11/29/2020   Insomnia 11/29/2020   Malignant neoplasm of upper-outer quadrant of left female breast ([place]) 07/23/2013   Migraine 11/29/2020   Mild intermittent asthma 11/29/2020   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   Mixed hyperlipidemia 11/29/2020   Obesity due to excess calories    Osteopenia after menopause 07/04/2020   Other long term (current) drug therapy 11/29/2020   Other vitamin B12 deficiency anemias 11/29/2020   Personal history of malignant neoplasm of breast 11/29/2020   Pre-diabetes    Renal cyst, acquired, right 07/04/2020   14.5 cm in October 2021   Retrocalcaneal bursitis (back of heel), left 04/22/2018   Seasonal allergic rhinitis 11/29/2020   Tightness of heel cord, left 04/22/2018   Type 2 diabetes mellitus without complications ([place]) 0/05/2724   Vitamin D deficiency 11/29/2020    Past Surgical History:  Procedure Laterality Date   CESAREAN SECTION     LIVER BIOPSY  01/2019   lumpectomy  2014   Left Breast   PORT-A-CATH REMOVAL     PORTACATH PLACEMENT  08/28/2013   STOMACH SURGERY     Tummy Tuck   TOTAL HIP ARTHROPLASTY Right 07/04/2021   Procedure: TOTAL HIP ARTHROPLASTY;  Surgeon:  Paralee Cancel, MD;  Location: [facility];  Service: Orthopedics;  Laterality: Right;  79   WISDOM TOOTH EXTRACTION      Social History   Socioeconomic History   Marital status: Married    Spouse name: Not on file   Number of children: Not on file   Years of education: Not on file   Highest education level: Not on file  Occupational History   Not on file  Tobacco Use   Smoking status: Former     Types: Cigarettes    Quit date: 2004    Years since quitting: 19.0   Smokeless tobacco: Never  Vaping Use   Vaping Use: Never used  Substance and Sexual Activity   Alcohol use: Yes    Comment: occasionally   Drug use: Never   Sexual activity: Not on file  Other Topics Concern   Not on file  Social History Narrative   Not on file   Social Determinants of Health   Financial Resource Strain: Not on file  Food Insecurity: Not on file  Transportation Needs: Not on file  Physical Activity: Not on file  Stress: Not on file  Social Connections: Not on file  Intimate Partner Violence: Not on file    Family History  Problem Relation Age of Onset   Hyperlipidemia Mother    Diabetes Father    Stroke Father    Colon cancer Neg Hx    Colon polyps Neg Hx    Esophageal cancer Neg Hx    Rectal cancer Neg Hx    Stomach cancer Neg Hx      Current Outpatient Medications:    dexamethasone (DECADRON) 4 MG tablet, Take 2 tabs twice daily beginning the day before chemotherapy and for 2 days after, every 3 weeks, by mouth, Disp: 60 tablet, Rfl: 0   Loratadine 10 MG CAPS, Take 1 capsule (10 mg total) by mouth daily., Disp: 30 capsule, Rfl: 6   ondansetron (ZOFRAN) 4 MG tablet, Take 1 tablet (4 mg total) by mouth every 4 (four) hours as needed for nausea., Disp: 90 tablet, Rfl: 3   prochlorperazine (COMPAZINE) 10 MG tablet, Take 1 tablet (10 mg total) by mouth every 6 (six) hours as needed for nausea or vomiting., Disp: 90 tablet, Rfl: 3   albuterol (VENTOLIN HFA) 108 (90 Base) MCG/ACT inhaler, Inhale 2 puffs into the lungs every 6 (six) hours as needed for shortness of breath., Disp: , Rfl:    ARIPiprazole (ABILIFY) 5 MG tablet, Take 2.5 mg by mouth daily., Disp: , Rfl:    aspirin 81 MG EC tablet, Take 81 mg by mouth daily. Swallow whole., Disp: , Rfl:    atorvastatin (LIPITOR) 20 MG tablet, Take 1 tablet (20 mg total) by mouth daily., Disp: 90 tablet, Rfl: 3   celecoxib (CELEBREX) 200 MG  capsule, Take 1 capsule (200 mg total) by mouth 2 (two) times daily. To start this after finishing Xarelto, Disp: 60 capsule, Rfl: 0   desvenlafaxine (PRISTIQ) 50 MG 24 hr tablet, Take 50 mg by mouth daily., Disp: , Rfl:    docusate sodium (COLACE) 100 MG capsule, Take 1 capsule (100 mg total) by mouth 2 (two) times daily., Disp: 10 capsule, Rfl: 0   FARXIGA 10 MG TABS tablet, Take 1 tablet (10 mg total) by mouth daily., Disp: 90 tablet, Rfl: 3   fluticasone (FLONASE) 50 MCG/ACT nasal spray, Place 1 spray into both nostrils daily., Disp: , Rfl:    labetalol (NORMODYNE) 200 MG tablet,  Take 1 tablet (200 mg total) by mouth 2 (two) times daily., Disp: 180 tablet, Rfl: 3   methocarbamol (ROBAXIN) 500 MG tablet, Take 1 tablet (500 mg total) by mouth every 6 (six) hours as needed for muscle spasms., Disp: 40 tablet, Rfl: 0   nitroGLYCERIN (NITROSTAT) 0.4 MG SL tablet, Place 0.4 mg under the tongue every 5 (five) minutes as needed for chest pain., Disp: , Rfl:    ondansetron (ZOFRAN) 4 MG tablet, Take 4 mg by mouth daily as needed for nausea., Disp: , Rfl:    ondansetron (ZOFRAN) 4 MG tablet, Take 1 tablet (4 mg total) by mouth every 4 (four) hours as needed for nausea., Disp: 90 tablet, Rfl: 3   oxyCODONE (OXY IR/ROXICODONE) 5 MG immediate release tablet, Take 1-2 tablets (5-10 mg total) by mouth every 4 (four) hours as needed for severe pain., Disp: 120 tablet, Rfl: 0   oxyCODONE (OXY IR/ROXICODONE) 5 MG immediate release tablet, Take 1-2 tablets (5-10 mg total) by mouth every 4 (four) hours as needed for severe pain., Disp: 180 tablet, Rfl: 0   polyethylene glycol (MIRALAX / GLYCOLAX) 17 g packet, Take 17 g by mouth daily as needed for mild constipation., Disp: 14 each, Rfl: 0   prochlorperazine (COMPAZINE) 10 MG tablet, Take 1 tablet (10 mg total) by mouth every 6 (six) hours as needed for nausea or vomiting., Disp: 90 tablet, Rfl: 3   tirzepatide (MOUNJARO) 7.5 MG/0.5ML Pen, See admin instructions.,  Disp: , Rfl:    topiramate (TOPAMAX) 50 MG tablet, Take 50 mg by mouth daily., Disp: , Rfl:    valsartan (DIOVAN) 40 MG tablet, Take 1 tablet (40 mg total) by mouth daily., Disp: 90 tablet, Rfl: 3   Vitamin D, Ergocalciferol, (DRISDOL) 1.25 MG (50000 UNIT) CAPS capsule, Take 50,000 Units by mouth once a week., Disp: , Rfl:   Current Facility-Administered Medications:    0.9 %  sodium chloride infusion, 500 mL, Intravenous, Once, Jackquline Denmark, MD   technetium sestamibi generic (CARDIOLITE) injection 10.1 millicurie, 75.1 millicurie, Intravenous, Once PRN, Revankar, Reita Cliche, MD  Facility-Administered Medications Ordered in Other Visits:    denosumab (XGEVA) injection 120 mg, 120 mg, Subcutaneous, Once, Derwood Kaplan, MD  CMP Latest Ref Rng & Units 07/20/2021  Glucose 70 - 99 mg/dL -  BUN 4 - 21 18  Creatinine 0.5 - 1.1 0.9  Sodium 137 - 147 139  Potassium 3.4 - 5.3 3.8  Chloride 99 - 108 104  CO2 13 - 22 26(A)  Calcium 8.7 - 10.7 9.8  Total Protein 6.5 - 8.1 g/dL -  Total Bilirubin 0.3 - 1.2 mg/dL -  Alkaline Phos 25 - 125 257(A)  AST 13 - 35 38(A)  ALT 7 - 35 49(A)   CBC Latest Ref Rng & Units 07/20/2021  WBC - 11.5  Hemoglobin 12.0 - 16.0 12.0  Hematocrit 36 - 46 37  Platelets 150 - 399 477(A)    No images are attached to the encounter.  NM PET Image Initial (PI) Whole Body  Result Date: 07/31/2021 CLINICAL DATA:  Subsequent treatment strategy for breast carcinoma. Skeletal metastasis. EXAM: NUCLEAR MEDICINE PET WHOLE BODY TECHNIQUE: Sub mCi F-18 FDG was injected intravenously. Full-ring PET imaging was performed from the head to foot after the radiotracer. CT data was obtained and used for attenuation correction and anatomic localization. Fasting blood glucose: 107 mg/dl COMPARISON:  None. FINDINGS: Mediastinal blood pool activity: SUV max 2.0 HEAD/NECK: No hypermetabolic lymph nodes in the neck. Hypermetabolic  activity associated with the lytic lesions in the high  calvarium over the LEFT and RIGHT cerebral convexity. Example lytic lesion measuring 1 cm on image 10 with SUV max equal 9.0. Incidental CT findings: none CHEST: Extensive expansile hypermetabolic mass involving the manubrium and sternum. Soft tissue extension into the anterior mediastinum measures 3.7 cm on image 83/CT series 4 with SUV max equal 11.2. Hypermetabolic activity involving the entirety of the sternum with SUV max equal 9.4. There is cortical destruction associated with the metabolic activity. No hypermetabolic mediastinal lymph nodes. No suspicious pulmonary nodules. Incidental CT findings: Marked elevation of the LEFT hemidiaphragm with associated LEFT lobe atelectasis. ABDOMEN/PELVIS: No abnormal activity in liver. Adrenal glands normal. No hypermetabolic abdominopelvic lymph nodes. Uterus and adnexa are normal. 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 Example expansile lucent lesion in LEFT sacrum measuring 3.5 cm with SUV max equal 15.1. Intense metabolic activity associated with recent RIGHT hip replacement. Potential pathologic fracture. Intense activity associated with the LEFT and RIGHT inferior pubic rami with SUV max equal 11.8 on image 190) Multiple hypermetabolic lesions in the thoracic lumbar spine. There is evidence cortical destruction along the posterior margin of several vertebral bodies. For example lesions at T3-T5. Concern sternal manubrial metastasis with soft tissue extension described in chest section Incidental CT findings: none EXTREMITIES: No abnormal hypermetabolic activity in the lower extremities. Incidental CT findings: none IMPRESSION: 1. 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 2. Potential pathologic fracture of the RIGHT femur with internal fixation. 3. Soft tissue extension into the anterior  mediastinum associated with the manubrial metastasis. 4. Multiple spinal vertebral body lesions have cortical destruction along the posterior margin. 5. Incidental finding of marked elevation of the LEFT hemidiaphragm. These results will be called to the ordering clinician or representative by the Radiologist Assistant, and communication documented in the PACS or [employer]. Electronically Signed   By: Suzy Bouchard M.D.   On: 07/31/2021 12:00     Assessment and plan- Patient is a 50 y.o. female who presents to [facility] for initial meeting in preparation for starting chemotherapy for the treatment of breast cancer.   Chemo Care Clinic/High Risk for ER/Hospitalization during chemotherapy- We discussed the role of the chemo care clinic and identified patient specific risk factors. I discussed that patient was identified as high risk primarily based on:  Patient has past medical history positive for: Past Medical History:  Diagnosis Date   Achilles tendinitis of left lower extremity 04/22/2018   Acute peptic ulcer without hemorrhage and without perforation 11/29/2020   Acute sinusitis 11/29/2020   Bipolar disorder ([place]) 11/29/2020   Breast cancer ([place])    Cancer ([place])    Cardiac murmur    Chest pain    Chronic fatigue syndrome 11/29/2020   Depression    Essential hypertension 11/29/2020   Hepatic steatosis determined by biopsy of liver 07/04/2020   HLD (hyperlipidemia)    Hypertension    Hypothyroidism 11/29/2020   Insomnia 11/29/2020   Malignant neoplasm of upper-outer quadrant of left female breast ([place]) 07/23/2013   Migraine 11/29/2020   Mild intermittent asthma 11/29/2020   Mild recurrent major depression ([place]) 11/29/2020   Mixed hyperlipidemia 11/29/2020   Obesity due to excess calories    Osteopenia after menopause 07/04/2020   Other long term (current) drug therapy 11/29/2020   Other vitamin B12 deficiency anemias 11/29/2020   Personal  history of malignant neoplasm of  breast 11/29/2020   Pre-diabetes    Renal cyst, acquired, right 07/04/2020   14.5 cm in October 2021   Retrocalcaneal bursitis (back of heel), left 04/22/2018   Seasonal allergic rhinitis 11/29/2020   Tightness of heel cord, left 04/22/2018   Type 2 diabetes mellitus without complications ([place]) 7/32/2025   Vitamin D deficiency 11/29/2020    Patient has past surgical history positive for: Past Surgical History:  Procedure Laterality Date   CESAREAN SECTION     LIVER BIOPSY  01/2019   lumpectomy  2014   Left Breast   PORT-A-CATH REMOVAL     PORTACATH PLACEMENT  08/28/2013   STOMACH SURGERY     Tummy Tuck   TOTAL HIP ARTHROPLASTY Right 07/04/2021   Procedure: TOTAL HIP ARTHROPLASTY;  Surgeon: Paralee Cancel, MD;  Location: [facility];  Service: Orthopedics;  Laterality: Right;  90   WISDOM TOOTH EXTRACTION      Provided general information including the following: 1.  Date of education: 08/09/2021 2.  Physician name: Dr. Hinton Rao 3.  Diagnosis: Breast Cancer 4.  Stage:Stage IIIB 5.  Control 6.  Chemotherapy plan including drugs and how often: Docetaxel, Carboplatin, Trastuzumab, Pertuzumab 7.  Start date: Pending authorization 8.  Other referrals: None at this time 9.  The patient is to call our office with any questions or concerns.  Our office number [phone], if after hours or on the weekend, call the same number and wait for the answering service.  There is always an oncologist on call 10.  Medications prescribed: Dexamethasone, Ondansetron, Prochlorperazine, Loratadine 11.  The patient has verbalized understanding of the treatment plan and has no barriers to adherence or understanding.  Obtained signed consent from patient.  Discussed symptoms including 1.  Low blood counts including red blood cells, white blood cells and platelets. 2. Infection including to avoid large crowds, wash hands frequently, and stay away from people who were sick.  If fever develops of 100.4 or  higher, call our office. 3.  Mucositis-given instructions on mouth rinse (baking soda and salt mixture).  Keep mouth clean.  Use soft bristle toothbrush.  If mouth sores develop, call our clinic. 4.  Nausea/vomiting-gave prescriptions for ondansetron 4 mg every 4 hours as needed for nausea, may take around the clock if persistent.  Compazine 10 mg every 6 hours, may take around the clock if persistent. 5.  Diarrhea-use over-the-counter Imodium.  Call clinic if not controlled. 6.  Constipation-use senna, 1 to 2 tablets twice a day.  If no BM in 2 to 3 days call the clinic. 7.  Loss of appetite-try to eat small meals every 2-3 hours.  Call clinic if not eating. 8.  Taste changes-zinc 500 mg daily.  If becomes severe call clinic. 9.  Alcoholic beverages. 10.  Drink 2 to 3 quarts of water per day. 11.  Peripheral neuropathy-patient to call if numbness or tingling in hands or feet is persistent  Neulasta-will be given 24 to 48 hours after chemotherapy.  Gave information sheet on bone and joint pain.  Use Claritin or Pepcid.  May use ibuprofen or Aleve.  Call if symptoms persist or are unbearable.   Answered questions to patient satisfaction.  Patient is to call with any further questions or concerns.  The medication prescribed to the patient will be printed out from [email or web address] This will give the following information: Name of your medication Approved uses Dose and schedule Storage and handling Handling body fluids and waste  Drug and food interactions Possible side effects and management Pregnancy, sexual activity, and contraception Obtaining medication  We discussed that social determinants of health may have significant impacts on health and outcomes for cancer patients.  Today we discussed specific social determinants of performance status, alcohol use, depression, financial needs, food insecurity, housing, interpersonal violence, social connections, stress, tobacco use, and  transportation.    After lengthy discussion the following were identified as areas of need:   Outpatient services: We discussed options including home based and outpatient services, DME and care program. We discusssed that patients who participate in regular physical activity report fewer negative impacts of cancer and treatments and report less fatigue.   Financial Concerns: We discussed that living with cancer can create tremendous financial burden.  We discussed options for assistance. I asked that if assistance is needed in affording medications or paying bills to please let us know so that we can provide assistance. We will also notify Mort Sawyers to see if cancer center can provide additional support.  Referral to Social work: Introduced Education officer, museum Mort Sawyers and the services he can provide such as support with utility bill, cell phone and gas vouchers.   Support groups: We discussed options for support groups at the cancer center. If interested, please notify nurse navigator to enroll. We discussed options for managing stress including healthy eating, exercise as well as participating in no charge counseling services at the cancer center and support groups.  If these are of interest, patient can notify either myself or primary nursing team.We discussed options for management including medications and referral to quit Smart program  Transportation: We discussed options for transportation.  I have notified primary oncology team who will help assist with arranging Lucianne Lei transportation for appointments when/if needed. We also discussed options for transportation on short notice/acute visits.  Palliative care services: We have palliative care services available in the cancer center to discuss goals of care and advanced care planning.  Please let us know if you have any questions or would like to speak to our palliative nurse practitioner.  Symptom Management Clinic: We discussed our symptom  management clinic which is available for acute concerns while receiving treatment such as nausea, vomiting or diarrhea.  We can be reached via telephone at [phone] or through my chart.  We are available for virtual or in person visits on the same day from 830 to 4 PM Monday through Friday. She denies needing specific assistance at this time and She will be followed by Dr. Hinton Rao clinical team.  Plan: Discussed symptom management clinic. Discussed palliative care services. Discussed resources that are available here at the cancer center. Discussed medications and new prescriptions to begin treatment such as anti-nausea or steroids.   Disposition: RTC on   Visit Diagnosis No diagnosis found.  Patient expressed understanding and was in agreement with this plan. She also understands that She can call clinic at any time with any questions, concerns, or complaints.   I provided 30 minutes of  face to face  during this encounter, and > 50% was spent counseling as documented under my assessment & plan.   Dayton Scrape, FNP- [facility]

## 2021-08-11 ENCOUNTER — Telehealth: Payer: Self-pay

## 2021-08-11 ENCOUNTER — Telehealth: Payer: Self-pay | Admitting: Oncology

## 2021-08-11 ENCOUNTER — Encounter: Payer: Self-pay | Admitting: Oncology

## 2021-08-11 NOTE — Telephone Encounter (Signed)
Patient notified of message and that her appointments for Jan. 3, 4, and 6, 2023 have been cancelled. Please schedule follow up and labs the week after radiation ends.

## 2021-08-11 NOTE — Telephone Encounter (Signed)
08/11/21 Spoke with patient and confirmed next appt  with Dr Hinton Rao

## 2021-08-11 NOTE — Telephone Encounter (Signed)
-----   Message from Derwood Kaplan, MD sent at 08/10/2021  6:54 PM EST ----- Regarding: call Tell her DEXA looks good, normal of left hip and just slight osteopenia of the spine. I recommend we wait to start the chemo until after she completes the radiation.  I discussed with Dr. Orlene Erm and it will only be about 2 weeks and treating multiple areas.Will plan to check her the week after she completes radiation to check labs and see if she is ready to proceed.

## 2021-08-15 ENCOUNTER — Other Ambulatory Visit: Payer: BC Managed Care – PPO

## 2021-08-16 ENCOUNTER — Other Ambulatory Visit: Payer: Self-pay

## 2021-08-16 ENCOUNTER — Ambulatory Visit: Payer: BC Managed Care – PPO

## 2021-08-16 ENCOUNTER — Other Ambulatory Visit: Payer: Self-pay | Admitting: Hematology and Oncology

## 2021-08-16 ENCOUNTER — Telehealth: Payer: Self-pay

## 2021-08-16 MED ORDER — MORPHINE SULFATE ER 15 MG PO TBCR: 15.0000 mg | EXTENDED_RELEASE_TABLET | Freq: Two times a day (BID) | ORAL | 0 refills | Status: DC

## 2021-08-16 NOTE — Telephone Encounter (Signed)
Pt states she is taking Oxycodone 1-2 tabs po q 4hr Prn as written for pain. However, pain returns in about 2 hours. Pain is in upper back, described as "pressure". She denies feeling like it is back spasms. She uses [facility] in [place].

## 2021-08-16 NOTE — Telephone Encounter (Addendum)
Pt notified that long acting medication (MS Contin )has been sent in. She will take 1 tab every 12 hrs around the clock.  She will use the oxycodone for breakthrough pain. I educated her on the importance of preventing constipation, and encouraged her to start taking senna 2 tabs po BID.Pt verbalized understanding.  ----- Message from Melodye Ped, NP sent at 08/16/2021 11:22 AM EST ----- Regarding: RE: Uncontrolled back pain Let me add long acting. Hopefully radiation is going to help.  ----- Message ----- From: Dairl Ponder, RN Sent: 08/16/2021  10:51 AM EST To: Melodye Ped, NP Subject: RE: Uncontrolled back pain                     Pt states she is taking 2 tabs each time  ----- Message ----- From: Melodye Ped, NP Sent: 08/16/2021  10:43 AM EST To: Dairl Ponder, RN Subject: RE: Uncontrolled back pain                     When she takes 2 does it improve?  ----- Message ----- From: Dairl Ponder, RN Sent: 08/16/2021  10:32 AM EST To: Melodye Ped, NP Subject: Uncontrolled back pain                         Pt states she is taking Oxycodone 1-2 tabs po q 4hr PRN as written for pain. However, pain returns in about 2 hours. Pain is in upper back, described as "pressure". She denies feeling like it is back spasms. She uses [facility] in [place].

## 2021-08-16 NOTE — Progress Notes (Signed)
Face to face visit with pt in [place]. Pt now has metastatic disease. She is currently on a walker as a result of hip surgery due to bone mets. Pt is here seeing Dr. Orlene Erm. Initially followed pt with her original breast cancer. Pt is in good spirits. Encouraged pt to call if I  can help her in any way.

## 2021-08-17 ENCOUNTER — Other Ambulatory Visit: Payer: Self-pay | Admitting: Hematology and Oncology

## 2021-08-18 ENCOUNTER — Other Ambulatory Visit: Payer: Self-pay

## 2021-08-18 ENCOUNTER — Other Ambulatory Visit (HOSPITAL_COMMUNITY): Payer: Self-pay | Admitting: Orthopedic Surgery

## 2021-08-18 ENCOUNTER — Ambulatory Visit: Payer: BC Managed Care – PPO

## 2021-08-18 ENCOUNTER — Ambulatory Visit (HOSPITAL_COMMUNITY)
Admission: RE | Admit: 2021-08-18 | Discharge: 2021-08-18 | Disposition: A | Discharge status: Home / Self Care | Payer: BC Managed Care – PPO | Source: Ambulatory Visit | Attending: Cardiology | Admitting: Cardiology

## 2021-08-18 ENCOUNTER — Encounter: Payer: Self-pay | Admitting: Oncology

## 2021-08-18 ENCOUNTER — Other Ambulatory Visit: Payer: Self-pay | Admitting: Hematology and Oncology

## 2021-08-18 DIAGNOSIS — M79604 Pain in right leg: Secondary | ICD-10-CM

## 2021-08-18 MED ORDER — OXYCODONE HCL 10 MG PO TABS: 10.0000 mg | ORAL_TABLET | ORAL | 0 refills | Status: DC | PRN

## 2021-08-22 ENCOUNTER — Other Ambulatory Visit: Payer: Self-pay | Admitting: Hematology and Oncology

## 2021-08-22 ENCOUNTER — Encounter: Payer: Self-pay | Admitting: Hematology and Oncology

## 2021-08-22 ENCOUNTER — Other Ambulatory Visit: Payer: Self-pay

## 2021-08-22 ENCOUNTER — Inpatient Hospital Stay: Payer: BC Managed Care – PPO | Attending: Oncology | Admitting: Hematology and Oncology

## 2021-08-22 DIAGNOSIS — C50412 Malignant neoplasm of upper-outer quadrant of left female breast: Secondary | ICD-10-CM | POA: Insufficient documentation

## 2021-08-22 DIAGNOSIS — L89152 Pressure ulcer of sacral region, stage 2: Secondary | ICD-10-CM

## 2021-08-22 DIAGNOSIS — Z170 Estrogen receptor positive status [ER+]: Secondary | ICD-10-CM | POA: Insufficient documentation

## 2021-08-22 DIAGNOSIS — C7951 Secondary malignant neoplasm of bone: Secondary | ICD-10-CM | POA: Insufficient documentation

## 2021-08-22 DIAGNOSIS — L8990 Pressure ulcer of unspecified site, unspecified stage: Secondary | ICD-10-CM | POA: Insufficient documentation

## 2021-08-22 MED ORDER — CALMOSEPTINE 0.44-20.6 % EX OINT: 1.0000 "application " | TOPICAL_OINTMENT | Freq: Three times a day (TID) | CUTANEOUS | 3 refills | Status: DC | PRN

## 2021-08-22 MED ORDER — CEPHALEXIN 500 MG PO CAPS: 500.0000 mg | ORAL_CAPSULE | Freq: Two times a day (BID) | ORAL | 0 refills | Status: DC

## 2021-08-22 NOTE — Progress Notes (Signed)
Patient Care Team: Hague, Rosalyn Charters, MD as PCP - General (Internal Medicine) Berniece Salines, DO as PCP - Cardiology (Cardiology) Laurell Roof, RN as Registered Nurse  Clinic Day:  08/22/2021  Referring physician: Bonnita Nasuti, MD  ASSESSMENT & PLAN:   Assessment & Plan: Pressure ulcer She has a stage 2 pressure ulcer noted to the sacral area. She notes this has been there for a couple of weeks. She has tried barrier creams with minimal response. Triple antibiotic ointment has helped somewhat. The area is red/purple with noted partial skin loss approximately 3 cm. I will start her on oral keflex for preventative measures and will send in calmoseptine ointment. She is here daily for radiation and will let us know if this worsens.     The patient understands the plans discussed today and is in agreement with them.  She knows to contact our office if she develops concerns prior to her next appointment.   Melodye Ped, NP  [facility] [address] [place] [address] Dept: [phone] Dept Fax: [phone]   No orders of the defined types were placed in this encounter.     CHIEF COMPLAINT:  CC: A 51 year old female with history of breast cancer here for symptom management; pressure sore  Current Treatment:  Daily radiation therapy  INTERVAL HISTORY:  Anna [name] is here today for symptom management assessment. She denies fevers or chills. She denies pain. Her appetite is good. Her weight has been stable.  I have reviewed the past medical history, past surgical history, social history and family history with the patient and they are unchanged from previous note.  ALLERGIES:  has No Known Allergies.  MEDICATIONS:  Current Outpatient Medications  Medication Sig Dispense Refill   albuterol (VENTOLIN HFA) 108 (90 Base) MCG/ACT inhaler Inhale 2 puffs into the lungs every 6 (six) hours as needed for shortness of  breath.     ARIPiprazole (ABILIFY) 5 MG tablet Take 2.5 mg by mouth daily.     aspirin 81 MG EC tablet Take 81 mg by mouth daily. Swallow whole.     atorvastatin (LIPITOR) 20 MG tablet Take 1 tablet (20 mg total) by mouth daily. 90 tablet 3   celecoxib (CELEBREX) 200 MG capsule Take 1 capsule (200 mg total) by mouth 2 (two) times daily. To start this after finishing Xarelto 60 capsule 0   cephALEXin (KEFLEX) 500 MG capsule Take 1 capsule (500 mg total) by mouth 2 (two) times daily. 14 capsule 0   desvenlafaxine (PRISTIQ) 50 MG 24 hr tablet Take 50 mg by mouth daily.     dexamethasone (DECADRON) 4 MG tablet Take 2 tabs twice daily beginning the day before chemotherapy and for 2 days after, every 3 weeks, by mouth 60 tablet 0   FARXIGA 10 MG TABS tablet Take 1 tablet (10 mg total) by mouth daily. 90 tablet 3   fluticasone (FLONASE) 50 MCG/ACT nasal spray Place 1 spray into both nostrils daily.     labetalol (NORMODYNE) 200 MG tablet Take 1 tablet (200 mg total) by mouth 2 (two) times daily. 180 tablet 3   Loratadine 10 MG CAPS Take 1 capsule (10 mg total) by mouth daily. 30 capsule 6   Menthol-Zinc Oxide (CALMOSEPTINE) 0.44-20.6 % OINT Apply 1 application topically 3 (three) times daily as needed. 71 g 3   methocarbamol (ROBAXIN) 500 MG tablet Take 1 tablet (500 mg total) by mouth every 6 (six)  hours as needed for muscle spasms. 40 tablet 0   morphine (MS CONTIN) 15 MG 12 hr tablet Take 1 tablet (15 mg total) by mouth every 12 (twelve) hours. 60 tablet 0   nitroGLYCERIN (NITROSTAT) 0.4 MG SL tablet Place 0.4 mg under the tongue every 5 (five) minutes as needed for chest pain.     ondansetron (ZOFRAN) 4 MG tablet Take 1 tablet (4 mg total) by mouth every 4 (four) hours as needed for nausea. 90 tablet 3   Oxycodone HCl 10 MG TABS Take 1 tablet (10 mg total) by mouth every 4 (four) hours as needed. 120 tablet 0   polyethylene glycol (MIRALAX / GLYCOLAX) 17 g packet Take 17 g by mouth daily as needed for  mild constipation. 14 each 0   prochlorperazine (COMPAZINE) 10 MG tablet Take 1 tablet (10 mg total) by mouth every 6 (six) hours as needed for nausea or vomiting. 90 tablet 3   senna (SENOKOT) 8.6 MG TABS tablet Take 2 tablets by mouth 2 (two) times daily.     tirzepatide [facility]) 7.5 MG/0.5ML Pen See admin instructions.     topiramate (TOPAMAX) 50 MG tablet Take 50 mg by mouth daily.     valsartan (DIOVAN) 40 MG tablet Take 1 tablet (40 mg total) by mouth daily. 90 tablet 3   Vitamin D, Ergocalciferol, (DRISDOL) 1.25 MG (50000 UNIT) CAPS capsule Take 50,000 Units by mouth once a week.     Current Facility-Administered Medications  Medication Dose Route Frequency Provider Last Rate Last Admin   0.9 %  sodium chloride infusion  500 mL Intravenous Once Jackquline Denmark, MD       technetium sestamibi generic (CARDIOLITE) injection 74.2 millicurie  59.5 millicurie Intravenous Once PRN Revankar, Reita Cliche, MD        HISTORY OF PRESENT ILLNESS:   Oncology History  Malignant neoplasm of upper-outer quadrant of left female breast ([employer])  07/23/2013 Initial Diagnosis   Malignant neoplasm of upper-outer quadrant of left female breast ([place])   07/23/2013 Cancer Staging   Staging form: Breast, AJCC 7th Edition - Clinical stage from 07/23/2013: Stage IIB (T2, N1, M0) - Signed by Derwood Kaplan, MD on 07/04/2020 Prognostic indicators: Pos LVI        REVIEW OF SYSTEMS:   Constitutional: Denies fevers, chills or abnormal weight loss Eyes: Denies blurriness of vision Ears, nose, mouth, throat, and face: Denies mucositis or sore throat Respiratory: Denies cough, dyspnea or wheezes Cardiovascular: Denies palpitation, chest discomfort or lower extremity swelling Gastrointestinal:  Denies nausea, heartburn or change in bowel habits Skin: Pressure ulcer noted to sacral region Lymphatics: Denies new lymphadenopathy or easy bruising Neurological:Denies numbness, tingling or new  weaknesses Behavioral/Psych: Mood is stable, no new changes  All other systems were reviewed with the patient and are negative.   VITALS:  Last menstrual period 08/18/2013.  Wt Readings from Last 3 Encounters:  08/09/21 185 lb (83.9 kg)  08/09/21 185 lb 8 oz (84.1 kg)  08/04/21 186 lb 11.2 oz (84.7 kg)    There is no height or weight on file to calculate BMI.  Performance status (ECOG): 1 - Symptomatic but completely ambulatory  PHYSICAL EXAM:   GENERAL:alert, no distress and comfortable SKIN: Red/purple area approximately 3 cm noted to lower sacral area with noted skin loss.  EYES: normal, Conjunctiva are pink and non-injected, sclera clear OROPHARYNX:no exudate, no erythema and lips, buccal mucosa, and tongue normal  NECK: supple, thyroid normal size, non-tender, without nodularity LYMPH:  no palpable lymphadenopathy in the cervical, axillary or inguinal LUNGS: clear to auscultation and percussion with normal breathing effort HEART: regular rate & rhythm and no murmurs and no lower extremity edema ABDOMEN:abdomen soft, non-tender and normal bowel sounds Musculoskeletal:no cyanosis of digits and no clubbing  NEURO: alert & oriented x 3 with fluent speech, no focal motor/sensory deficits  LABORATORY DATA:  I have reviewed the data as listed    Component Value Date/Time   NA 139 07/20/2021 0000   K 3.8 07/20/2021 0000   CL 104 07/20/2021 0000   CO2 26 (A) 07/20/2021 0000   GLUCOSE 147 (H) 07/05/2021 0330   BUN 18 07/20/2021 0000   CREATININE 0.9 07/20/2021 0000   CREATININE 0.71 07/05/2021 0330   CALCIUM 9.8 07/20/2021 0000   PROT 7.7 06/30/2021 0833   ALBUMIN 4.1 07/20/2021 0000   AST 38 (A) 07/20/2021 0000   ALT 49 (A) 07/20/2021 0000   ALKPHOS 257 (A) 07/20/2021 0000   BILITOT 0.7 06/30/2021 0833   GFRNONAA >60 07/05/2021 0330    No results found for: SPEP, UPEP  Lab Results  Component Value Date   WBC 11.5 07/20/2021   NEUTROABS 7.48 07/20/2021   HGB 12.0  07/20/2021   HCT 37 07/20/2021   MCV 86.9 07/05/2021   PLT 477 (A) 07/20/2021      Chemistry      Component Value Date/Time   NA 139 07/20/2021 0000   K 3.8 07/20/2021 0000   CL 104 07/20/2021 0000   CO2 26 (A) 07/20/2021 0000   BUN 18 07/20/2021 0000   CREATININE 0.9 07/20/2021 0000   CREATININE 0.71 07/05/2021 0330   GLU 97 07/20/2021 0000      Component Value Date/Time   CALCIUM 9.8 07/20/2021 0000   ALKPHOS 257 (A) 07/20/2021 0000   AST 38 (A) 07/20/2021 0000   ALT 49 (A) 07/20/2021 0000   BILITOT 0.7 06/30/2021 0833       RADIOGRAPHIC STUDIES: I have personally reviewed the radiological images as listed and agreed with the findings in the report. NM PET Image Initial (PI) Whole Body  Result Date: 07/31/2021 CLINICAL DATA:  Subsequent treatment strategy for breast carcinoma. Skeletal metastasis. EXAM: NUCLEAR MEDICINE PET WHOLE BODY TECHNIQUE: Sub mCi F-18 FDG was injected intravenously. Full-ring PET imaging was performed from the head to foot after the radiotracer. CT data was obtained and used for attenuation correction and anatomic localization. Fasting blood glucose: 107 mg/dl COMPARISON:  None. FINDINGS: Mediastinal blood pool activity: SUV max 2.0 HEAD/NECK: No hypermetabolic lymph nodes in the neck. Hypermetabolic activity associated with the lytic lesions in the high calvarium over the LEFT and RIGHT cerebral convexity. Example lytic lesion measuring 1 cm on image 10 with SUV max equal 9.0. Incidental CT findings: none CHEST: Extensive expansile hypermetabolic mass involving the manubrium and sternum. Soft tissue extension into the anterior mediastinum measures 3.7 cm on image 83/CT series 4 with SUV max equal 11.2. Hypermetabolic activity involving the entirety of the sternum with SUV max equal 9.4. There is cortical destruction associated with the metabolic activity. No hypermetabolic mediastinal lymph nodes. No suspicious pulmonary nodules. Incidental CT findings:  Marked elevation of the LEFT hemidiaphragm with associated LEFT lobe atelectasis. ABDOMEN/PELVIS: No abnormal activity in liver. Adrenal glands normal. No hypermetabolic abdominopelvic lymph nodes. Uterus and adnexa are normal. 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 Example expansile lucent lesion in LEFT sacrum measuring 3.5 cm with SUV max equal  94.7. Intense metabolic activity associated with recent RIGHT hip replacement. Potential pathologic fracture. Intense activity associated with the LEFT and RIGHT inferior pubic rami with SUV max equal 11.8 on image 190) Multiple hypermetabolic lesions in the thoracic lumbar spine. There is evidence cortical destruction along the posterior margin of several vertebral bodies. For example lesions at T3-T5. Concern sternal manubrial metastasis with soft tissue extension described in chest section Incidental CT findings: none EXTREMITIES: No abnormal hypermetabolic activity in the lower extremities. Incidental CT findings: none IMPRESSION: 1. 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 2. Potential pathologic fracture of the RIGHT femur with internal fixation. 3. Soft tissue extension into the anterior mediastinum associated with the manubrial metastasis. 4. Multiple spinal vertebral body lesions have cortical destruction along the posterior margin. 5. Incidental finding of marked elevation of the LEFT hemidiaphragm. These results will be called to the ordering clinician or representative by the Radiologist Assistant, and communication documented in the PACS or [employer]. Electronically Signed   By: Suzy Bouchard M.D.   On: 07/31/2021 12:00   VAS [place] LOWER EXTREMITY VENOUS (DVT)  Result Date: 08/20/2021  Lower Venous DVT Study Patient Name:  Anna [name] [facility]  Date of Exam:   08/18/2021  Medical Rec #: [id number]            Accession #:    [id number] Date of Birth: [birth year]           Patient Gender: F Patient Age:   62 years Exam Location:  [facility] Procedure:      VAS [place] LOWER EXTREMITY VENOUS (DVT) Referring Phys: Paralee Cancel --------------------------------------------------------------------------------  Indications: Patient reports swelling and pain in right lower calf for two weeks. Patient states she has chest pain and shortness of breath due to chest metastases. Otherwise no new chest pain or shortness of breath.  Risk Factors: Cancer Breast cancer with metastases to the bone. Comparison Study: NA Performing Technologist: Leavy Cella RDCS Supporting Technologist: Mariane Masters RVT  Examination Guidelines: A complete evaluation includes B-mode imaging, spectral Doppler, color Doppler, and power Doppler as needed of all accessible portions of each vessel. Bilateral testing is considered an integral part of a complete examination. Limited examinations for reoccurring indications may be performed as noted. The reflux portion of the exam is performed with the patient in reverse Trendelenburg.  +---------+---------------+---------+-----------+----------+--------------+  RIGHT     Compressibility Phasicity Spontaneity Properties Thrombus Aging  +---------+---------------+---------+-----------+----------+--------------+  CFV       Full            Yes       Yes                                    +---------+---------------+---------+-----------+----------+--------------+  SFJ       Full            Yes       Yes                                    +---------+---------------+---------+-----------+----------+--------------+  FV Prox   Full            Yes       Yes                                    +---------+---------------+---------+-----------+----------+--------------+  FV Mid    Full            Yes       Yes                                     +---------+---------------+---------+-----------+----------+--------------+  FV Distal Full            Yes       Yes                                    +---------+---------------+---------+-----------+----------+--------------+  PFV       Full            Yes       Yes                                    +---------+---------------+---------+-----------+----------+--------------+  POP       Full            Yes       Yes                                    +---------+---------------+---------+-----------+----------+--------------+  PTV       Full            Yes       Yes                                    +---------+---------------+---------+-----------+----------+--------------+  PERO      Full            Yes       Yes                                    +---------+---------------+---------+-----------+----------+--------------+  Gastroc   Full                                                             +---------+---------------+---------+-----------+----------+--------------+  GSV       Full            Yes       Yes                                    +---------+---------------+---------+-----------+----------+--------------+   +---------+---------------+---------+-----------+----------+--------------+  LEFT      Compressibility Phasicity Spontaneity Properties Thrombus Aging  +---------+---------------+---------+-----------+----------+--------------+  CFV       Full            Yes       Yes                                    +---------+---------------+---------+-----------+----------+--------------+  SFJ       Full  Yes       Yes                                    +---------+---------------+---------+-----------+----------+--------------+  FV Prox   Full            Yes       Yes                                    +---------+---------------+---------+-----------+----------+--------------+  FV Mid    Full            Yes       Yes                                     +---------+---------------+---------+-----------+----------+--------------+  FV Distal Full            Yes       Yes                                    +---------+---------------+---------+-----------+----------+--------------+  PFV       Full            Yes       Yes                                    +---------+---------------+---------+-----------+----------+--------------+  POP       Full            Yes       Yes                                    +---------+---------------+---------+-----------+----------+--------------+  PTV       Full            Yes       Yes                                    +---------+---------------+---------+-----------+----------+--------------+  PERO      Full            Yes       Yes                                    +---------+---------------+---------+-----------+----------+--------------+  Gastroc   Full                                                             +---------+---------------+---------+-----------+----------+--------------+  GSV       Full            Yes       Yes                                    +---------+---------------+---------+-----------+----------+--------------+  Findings reported to [facility] chat Dr.Olin at 4:08pm.  Summary: RIGHT: - No evidence of deep vein thrombosis in the lower extremity. No indirect evidence of obstruction proximal to the inguinal ligament. - No cystic structure found in the popliteal fossa.  LEFT: - No evidence of deep vein thrombosis in the lower extremity. No indirect evidence of obstruction proximal to the inguinal ligament. - No cystic structure found in the popliteal fossa.  *See table(s) above for measurements and observations. Electronically signed by Carlyle Dolly MD on 08/20/2021 at 7:34:52 PM.    Final

## 2021-08-22 NOTE — Assessment & Plan Note (Signed)
She has a stage 2 pressure ulcer noted to the sacral area. She notes this has been there for a couple of weeks. She has tried barrier creams with minimal response. Triple antibiotic ointment has helped somewhat. The area is red/purple with noted partial skin loss approximately 3 cm. I will start her on oral keflex for preventative measures and will send in calmoseptine ointment. She is here daily for radiation and will let us know if this worsens.

## 2021-08-24 ENCOUNTER — Encounter: Payer: Self-pay | Admitting: Oncology

## 2021-08-25 ENCOUNTER — Encounter: Payer: Self-pay | Admitting: Oncology

## 2021-08-25 NOTE — Addendum Note (Signed)
Addended by: Juanetta Beets on: 08/25/2021 11:23 AM   Modules accepted: Orders

## 2021-08-31 NOTE — Progress Notes (Signed)
[facility]  [address] [place],  [place]  [address] [phone]  Clinic Day:  09/06/2021  Referring physician: Bonnita Nasuti, MD  This document serves as a record of services personally performed by Hosie Poisson, MD. It was created on their behalf by Curry,Lauren E, a trained medical scribe. The creation of this record is based on the scribe's personal observations and the provider's statements to them.  CHIEF COMPLAINT:  CC:  Stage II B hormone receptor positive breast cancer  Current Treatment:   TCHP as well as hormonal therapy   HISTORY OF PRESENT ILLNESS:  Anna [name] is a 51 y.o. female with a stage IIB (T2 N1a M0) hormone receptor positive left breast cancer diagnosed in December 2014.  She was treated with lumpectomy.  Pathology revealed a 2.1 cm, grade 3, invasive ductal carcinoma with 1 lymph node positive for metastasis.  Lymphovascular invasion was seen.  Estrogen and progesterone receptors were positive and her 2 Neu negative.  Ki 67 was 76%.  She received adjuvant chemotherapy with 4 cycles of doxorubicin and cyclophosphamide, followed by 12 weeks of weekly paclitaxel.  She then received adjuvant radiation to the left breast.  She was placed on tamoxifen 20 mg daily in October 2015.  She underwent testing for hereditary cancer syndromes with [facility] myRisk Hereditary Cancer panel, which did not reveal any clinically significant mutation or variants of uncertain significance, so no change in management was recommended.  She was found to have fatty infiltration of the liver on a CTA chest in June 2015.  Bone density scan in November 2017 was normal.  Bilateral diagnostic mammogram in November 2018 did not reveal any evidence of malignancy.  When she was seen in May 2019, she had mild elevation of the AST, which was felt to likely be due to medication effects. She had a bilateral diagnostic mammogram in January 2020.  At that  time, she reported a lump in the left breast, so an ultrasound was also done.  Bilateral mammogram and left breast ultrasound did not reveal any suspicious findings.  Postsurgical scarring was seen in the left breast.  The area of palpable abnormality in the left breast was overlying the postsurgical scarring.  She was rescheduled in our office in May 2020 and found to have significant increase in her liver transaminases.  Tamoxifen was discontinued.  She was instructed to avoid alcohol and Tylenol.  CT abdomen and pelvis revealed severe hepatic steatosis.  Bone density scan in June 2020 revealed mild osteopenia with a T-score -1.4 in the spine.  Her last menstruation was in January 2015 and labs confirmed her postmenopausal status.  She was placed on anastrozole 1 mg daily in June 2020.  She was also referred to hepatology and saw Roosevelt Locks, NP, who diagnosed the patient with nonalcoholic fatty liver disease.  She was unsure if this was secondary to tamoxifen, but felt it was unlikely.  Liver biopsy in July 2020 confirmed severe steatosis of 75-80% with moderate ballooning degeneration.  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  The patient has had improvement in the liver transaminases. Abdominal ultrasound per Dr. Beverley Fiedler which revealed an enlarging exophytic cyst of the right kidney.  This has been appreciated for many years, on PET imaging from 2014 this measured 10.6 cm, CT in May 2020 this was 12.4 cm, and recent abdominal ultrasound from July measured this  at 14.5 cm. MRI done in November confirmed a benign cyst of the right kidney.  She continues to see Dr. Beverley Fiedler every 6 months.  She has also been seen by dermatology at [facility], with a good report.   She began to have pain and functional disability several months ago with rapid worsening. MRI of the right hip from November 14th revealed multiple marrow replacing lesions within the proximal femur  within the ischium as well as the inferior rami. These findings were most consistent with metastatic disease and impending fracture. The primary and largest focus nearly fills the entire medullary space at the femoral neck with endosteal thinning and irregularity, most severely over the anterior cortex. She underwent right hip arthroplasty on November 22nd with Dr. Paralee Cancel. Surgical pathology confirmed metastatic carcinoma, consistent with patient's clinical history of primary breast carcinoma. She had severe weight loss. She is using oxycodone 5 mg for her pain.  Prognostic profile confirmed estrogen receptor was positive at 95%, and progesterone receptor was positive at 40%. HER2 was positive 3+, which was negative on her original cancer. PET imaging from December 19th revealed dominant finding of intensely hypermetabolic aggressive skeletal metastasis involving the calvarium, sternum, thoracic and lumbar spine, and pelvis. There is potential pathologic fracture of the right femur with internal fixation, and soft tissue extension into the anterior mediastinum associated with the manubrial metastasis. Multiple spinal vertebral body lesions have cortical destruction along the posterior margin. Baseline ECHO which looks good with an EF of 55-60%. Bone density from December revealed mild osteopenia of the AP spine with a T-score of -1.2.  INTERVAL HISTORY:  Anna [name] is here for routine follow up after completing radiation therapy yesterday to the right hip, sternum and sacrum,and notes her back/hip pain is improved. She continues MS Contin 15 mg every 12 hours and oxycodone 10 mg every 5-6 hours. She states that she has two persistent sores of the buttock, which had started to heal, but these have opened up again. She has been using triple antibiotic ointment on the area. She reports numbness of the first two fingers of the left hand. She has noticed swelling of the left upper extremity for the past three months.  She continues to use a walker to assist her ambulation. She is scheduled for MRI brain on January 30th. Blood counts and chemistries are unremarkable except for an alkaline phosphatase of 176, improved. Her  appetite is good, but she has lost 9 and 1/2 pounds since her last visit.  She denies fever, chills or other signs of infection.  She denies nausea, vomiting, bowel issues, or abdominal pain.  She denies sore throat, cough, dyspnea, or chest pain.  REVIEW OF SYSTEMS:  Review of Systems  Constitutional: Negative.  Negative for appetite change, chills, fatigue, fever and unexpected weight change.  HENT:  Negative.    Eyes: Negative.   Respiratory: Negative.  Negative for chest tightness, cough, hemoptysis, shortness of breath and wheezing.   Cardiovascular: Negative.  Negative for chest pain, leg swelling and palpitations.  Gastrointestinal: Negative.  Negative for abdominal distention, abdominal pain, blood in stool, constipation, diarrhea, nausea and vomiting.  Endocrine: Negative.   Genitourinary: Negative.  Negative for difficulty urinating, dysuria, frequency and hematuria.   Musculoskeletal:  Positive for back pain (and right hip pain, improved) and gait problem (using a walker). Negative for arthralgias, flank pain and myalgias.  Skin:  Positive for wound (two sores of the buttock).  Neurological:  Positive for gait problem (using a walker)  and numbness (of the last two fingers of the left hand, consistent with carpal tunnel). Negative for dizziness, extremity weakness, headaches, light-headedness, seizures and speech difficulty.  Hematological: Negative.   Psychiatric/Behavioral: Negative.  Negative for depression and sleep disturbance. The patient is not nervous/anxious.    Swelling LUE  First two fingers VITALS:  Blood pressure (!) 109/59, pulse (!) 110, temperature 98.2 F (36.8 C), temperature source Oral, resp. rate 20, height 5' 2" (1.575 m), weight 173 lb 14.4 oz (78.9 kg), last  menstrual period 08/18/2013, SpO2 95 %.  Wt Readings from Last 3 Encounters:  09/06/21 173 lb 14.4 oz (78.9 kg)  08/22/21 184 lb 4.8 oz (83.6 kg)  08/09/21 185 lb (83.9 kg)    Body mass index is 31.81 kg/m.  Performance status (ECOG): 1 - Symptomatic but completely ambulatory  PHYSICAL EXAM:  Physical Exam Constitutional:      General: She is not in acute distress.    Appearance: Normal appearance. She is normal weight.  HENT:     Head: Normocephalic and atraumatic.  Eyes:     General: No scleral icterus.    Extraocular Movements: Extraocular movements intact.     Conjunctiva/sclera: Conjunctivae normal.     Pupils: Pupils are equal, round, and reactive to light.  Cardiovascular:     Rate and Rhythm: Regular rhythm. Tachycardia present.     Pulses: Normal pulses.     Heart sounds: Normal heart sounds. No murmur heard.   No friction rub. No gallop.  Pulmonary:     Effort: Pulmonary effort is normal. No respiratory distress.     Breath sounds: Normal breath sounds.  Abdominal:     General: Bowel sounds are normal. There is no distension.     Palpations: Abdomen is soft. There is no hepatomegaly, splenomegaly or mass.     Tenderness: There is no abdominal tenderness.  Musculoskeletal:        General: Normal range of motion.     Cervical back: Normal range of motion and neck supple.     Right lower leg: Edema (1-2+, worse than left) present.     Left lower leg: Edema (1-2+) present.  Lymphadenopathy:     Cervical: No cervical adenopathy.     Upper Body:     Left upper body: Supraclavicular adenopathy (2 cm hard node) present.  Skin:    General: Skin is warm and dry.     Comments: Bilateral sacral decubiti with ulceration but no drainage  Neurological:     General: No focal deficit present.     Mental Status: She is alert and oriented to person, place, and time. Mental status is at baseline.  Psychiatric:        Mood and Affect: Mood normal.        Behavior: Behavior  normal.        Thought Content: Thought content normal.        Judgment: Judgment normal.  She has lymphedema 1+ of the LUE Less swelling of the anterior sternum LABS:   CBC Latest Ref Rng & Units 09/06/2021 07/20/2021 07/05/2021  WBC - 5.1 11.5 14.1(H)  Hemoglobin 12.0 - 16.0 12.3 12.0 12.2  Hematocrit 36 - 46 39 37 39.1  Platelets 150 - 399 307 477(A) 238   CMP Latest Ref Rng & Units 09/06/2021 07/20/2021 07/05/2021  Glucose 70 - 99 mg/dL - - 147(H)  BUN 4 - _0 Creatinine 0.5 - 1.1 0.6 0.9 0.71  Sodium 137 -  147 139 139 135  Potassium 3.4 - 5.3 4.1 3.8 4.0  Chloride 99 - 108 110(A) 104 103  CO2 13 - 22 21 26(A) 24  Calcium 8.7 - 10.7 8.8 9.8 9.2  Total Protein 6.5 - 8.1 g/dL - - -  Total Bilirubin 0.3 - 1.2 mg/dL - - -  Alkaline Phos 25 - 125 176(A) 257(A) -  AST 13 - 35 30 38(A) -  ALT 7 - 35 23 49(A) -    STUDIES:  VAS [place] LOWER EXTREMITY VENOUS (DVT)  Result Date: 08/20/2021  Lower Venous DVT Study Patient Name:  KEMYA SHED [facility]  Date of Exam:   08/18/2021 Medical Rec #: [id number]            Accession #:    [id number] Date of Birth: [birth year]           Patient Gender: F Patient Age:   89 years Exam Location:  [facility] Procedure:      VAS [place] LOWER EXTREMITY VENOUS (DVT) Referring Phys: Paralee Cancel --------------------------------------------------------------------------------  Indications: Patient reports swelling and pain in right lower calf for two weeks. Patient states she has chest pain and shortness of breath due to chest metastases. Otherwise no new chest pain or shortness of breath.  Risk Factors: Cancer Breast cancer with metastases to the bone. Comparison Study: NA Performing Technologist: Leavy Cella RDCS Supporting Technologist: Mariane Masters RVT  Examination Guidelines: A complete evaluation includes B-mode imaging, spectral Doppler, color Doppler, and power Doppler as needed of all accessible portions of each vessel. Bilateral testing is considered an  integral part of a complete examination. Limited examinations for reoccurring indications may be performed as noted. The reflux portion of the exam is performed with the patient in reverse Trendelenburg.  +---------+---------------+---------+-----------+----------+--------------+  RIGHT     Compressibility Phasicity Spontaneity Properties Thrombus Aging  +---------+---------------+---------+-----------+----------+--------------+  CFV       Full            Yes       Yes                                    +---------+---------------+---------+-----------+----------+--------------+  SFJ       Full            Yes       Yes                                    +---------+---------------+---------+-----------+----------+--------------+  FV Prox   Full            Yes       Yes                                    +---------+---------------+---------+-----------+----------+--------------+  FV Mid    Full            Yes       Yes                                    +---------+---------------+---------+-----------+----------+--------------+  FV Distal Full            Yes       Yes                                    +---------+---------------+---------+-----------+----------+--------------+  PFV       Full            Yes       Yes                                    +---------+---------------+---------+-----------+----------+--------------+  POP       Full            Yes       Yes                                    +---------+---------------+---------+-----------+----------+--------------+  PTV       Full            Yes       Yes                                    +---------+---------------+---------+-----------+----------+--------------+  PERO      Full            Yes       Yes                                    +---------+---------------+---------+-----------+----------+--------------+  Gastroc   Full                                                              +---------+---------------+---------+-----------+----------+--------------+  GSV       Full            Yes       Yes                                    +---------+---------------+---------+-----------+----------+--------------+   +---------+---------------+---------+-----------+----------+--------------+  LEFT      Compressibility Phasicity Spontaneity Properties Thrombus Aging  +---------+---------------+---------+-----------+----------+--------------+  CFV       Full            Yes       Yes                                    +---------+---------------+---------+-----------+----------+--------------+  SFJ       Full            Yes       Yes                                    +---------+---------------+---------+-----------+----------+--------------+  FV Prox   Full            Yes       Yes                                    +---------+---------------+---------+-----------+----------+--------------+  FV Mid    Full  Yes       Yes                                    +---------+---------------+---------+-----------+----------+--------------+  FV Distal Full            Yes       Yes                                    +---------+---------------+---------+-----------+----------+--------------+  PFV       Full            Yes       Yes                                    +---------+---------------+---------+-----------+----------+--------------+  POP       Full            Yes       Yes                                    +---------+---------------+---------+-----------+----------+--------------+  PTV       Full            Yes       Yes                                    +---------+---------------+---------+-----------+----------+--------------+  PERO      Full            Yes       Yes                                    +---------+---------------+---------+-----------+----------+--------------+  Gastroc   Full                                                              +---------+---------------+---------+-----------+----------+--------------+  GSV       Full            Yes       Yes                                    +---------+---------------+---------+-----------+----------+--------------+    Findings reported to [facility] chat Dr.Olin at 4:08pm.  Summary: RIGHT: - No evidence of deep vein thrombosis in the lower extremity. No indirect evidence of obstruction proximal to the inguinal ligament. - No cystic structure found in the popliteal fossa.  LEFT: - No evidence of deep vein thrombosis in the lower extremity. No indirect evidence of obstruction proximal to the inguinal ligament. - No cystic structure found in the popliteal fossa.  *See table(s) above for measurements and observations. Electronically signed by Carlyle Dolly MD on 08/20/2021 at 7:34:52 PM.    Final       HISTORY:   Allergies: No  Known Allergies  Current Medications: Current Outpatient Medications  Medication Sig Dispense Refill   albuterol (VENTOLIN HFA) 108 (90 Base) MCG/ACT inhaler Inhale 2 puffs into the lungs every 6 (six) hours as needed for shortness of breath.     ARIPiprazole (ABILIFY) 5 MG tablet Take 2.5 mg by mouth daily.     aspirin 81 MG EC tablet Take 81 mg by mouth daily. Swallow whole.     atorvastatin (LIPITOR) 20 MG tablet Take 1 tablet (20 mg total) by mouth daily. 90 tablet 3   celecoxib (CELEBREX) 200 MG capsule Take 1 capsule (200 mg total) by mouth 2 (two) times daily. To start this after finishing Xarelto 60 capsule 0   cephALEXin (KEFLEX) 500 MG capsule Take 1 capsule (500 mg total) by mouth 2 (two) times daily. 14 capsule 0   desvenlafaxine (PRISTIQ) 50 MG 24 hr tablet Take 50 mg by mouth daily.     dexamethasone (DECADRON) 4 MG tablet Take 2 tabs twice daily beginning the day before chemotherapy and for 2 days after, every 3 weeks, by mouth 60 tablet 0   FARXIGA 10 MG TABS tablet Take 1 tablet (10 mg total) by mouth daily. 90 tablet 3   fluticasone  (FLONASE) 50 MCG/ACT nasal spray Place 1 spray into both nostrils daily.     labetalol (NORMODYNE) 200 MG tablet Take 1 tablet (200 mg total) by mouth 2 (two) times daily. 180 tablet 3   Loratadine 10 MG CAPS Take 1 capsule (10 mg total) by mouth daily. 30 capsule 6   methocarbamol (ROBAXIN) 500 MG tablet Take 1 tablet (500 mg total) by mouth every 6 (six) hours as needed for muscle spasms. 40 tablet 0   morphine (MS CONTIN) 15 MG 12 hr tablet Take 1 tablet (15 mg total) by mouth every 12 (twelve) hours. 60 tablet 0   nitroGLYCERIN (NITROSTAT) 0.4 MG SL tablet Place 0.4 mg under the tongue every 5 (five) minutes as needed for chest pain.     ondansetron (ZOFRAN) 4 MG tablet Take 1 tablet (4 mg total) by mouth every 4 (four) hours as needed for nausea. 90 tablet 3   Oxycodone HCl 10 MG TABS Take 1 tablet (10 mg total) by mouth every 4 (four) hours as needed. 120 tablet 0   OZEMPIC, 0.25 OR 0.5 MG/DOSE, 2 MG/1.5ML SOPN Inject into the skin.     polyethylene glycol (MIRALAX / GLYCOLAX) 17 g packet Take 17 g by mouth daily as needed for mild constipation. 14 each 0   prochlorperazine (COMPAZINE) 10 MG tablet Take 1 tablet (10 mg total) by mouth every 6 (six) hours as needed for nausea or vomiting. 90 tablet 3   senna (SENOKOT) 8.6 MG TABS tablet Take 2 tablets by mouth 2 (two) times daily.     topiramate (TOPAMAX) 50 MG tablet Take 50 mg by mouth daily.     valsartan (DIOVAN) 40 MG tablet Take 1 tablet (40 mg total) by mouth daily. 90 tablet 3   Vitamin D, Ergocalciferol, (DRISDOL) 1.25 MG (50000 UNIT) CAPS capsule Take 50,000 Units by mouth once a week.     Current Facility-Administered Medications  Medication Dose Route Frequency Provider Last Rate Last Admin   0.9 %  sodium chloride infusion  500 mL Intravenous Once Jackquline Denmark, MD       technetium sestamibi generic (CARDIOLITE) injection 75.1 millicurie  02.5 millicurie Intravenous Once PRN Revankar, Reita Cliche, MD         ASSESSMENT &  PLAN:    Assessment:  1. 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 She was on adjuvant hormonal therapy with tamoxifen 20 mg daily from October 2015 to May 2020, but due to elevation of the liver transaminases was switched to anastrazole.   2. Severe hepatic steatosis with elevation of the liver transaminases, nearly normalized now.  She continues to follow with hepatology, Dr. Beverley Fiedler every 6 months who has recommended annual ultrasounds of the liver.  3. Borderline osteopenia of the spine.  She is on high-dose vitamin-D, and I advised her to take calcium daily.   4. Exophytic cyst of the right kidney, present for many years which had slightly enlarged.   MRI confirmed this to be a benign cyst.   5. New bone metastases, November 2022, with multiple marrow replacing lesions within the proximal right femur within the ischium as well as the inferior rami. 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 Ki67 was 60%. PET imaging from December 19th revealed dominant finding of intensely hypermetabolic aggressive skeletal metastasis involving the calvarium, sternum, thoracic and lumbar spine, and pelvis, with potential pathologic fracture of the right femur with internal fixation. There is soft tissue extension into the anterior mediastinum associated with the manubrial metastasis. Multiple spinal vertebral body lesions have cortical destruction along the posterior margin. She started monthly Xgeva in December, and recently completed radiation therapy. We will plan for [facility] and hormonal therapy.  6. Bilateral sacral decubiti, which she has been using triple antibiotic ointment. We discussed the importance of continuing this, staying off the area and keeping the area covered in order to heal.  7. Lymphedema of the left upper  extremity with numbness of the first two fingers of the left hand. I gave her a booklet with information and recommendations, and we will refer her to Amy the lymphedema specialist as well. I can feel a hard lymph node in the left supraclavicular fossa and so this may be contributing to the edema.   8. Probable carpal tunnel syndrome on the left. This may be largely induced by her lymphedema but the symptoms are fairly severe. I will refer her to occupational therapy.  Plan:    She recently completed palliative radiation therapy yesterday with some improvement in her pain. She will need chemotherapy with Taxotere/carboplatin along with HER 2 directed therapy of Herceptin and Perjeta. She has met with Lenna Sciara for education. However, we will need to wait until the bilateral sacral decubiti are healed before initiating. She received her 1st dose of Xgeva on December 28th, which will be continued monthly, and we will schedule this for Friday. We will also address her new left upper extremity lymphedema and carpal tunnel with referral to occupational therapy. She knows to continue oral calcium 600 mg daily and vitamin D. The patient and her husband understand the plans discussed today and are in agreement with them.  She knows to contact our office if she develops concerns prior to her next appointment.   I provided 30 minutes of face-to-face time during this this encounter and > 50% was spent counseling as documented under my assessment and plan.    I, Rita Ohara, am acting as scribe for Derwood Kaplan, MD  I have reviewed this report as typed by the medical scribe, and it is complete and accurate.

## 2021-09-06 ENCOUNTER — Inpatient Hospital Stay: Payer: BC Managed Care – PPO

## 2021-09-06 ENCOUNTER — Inpatient Hospital Stay: Payer: BC Managed Care – PPO | Admitting: Oncology

## 2021-09-06 ENCOUNTER — Telehealth: Payer: Self-pay

## 2021-09-06 ENCOUNTER — Encounter: Payer: Self-pay | Admitting: Oncology

## 2021-09-06 ENCOUNTER — Other Ambulatory Visit: Payer: Self-pay

## 2021-09-06 ENCOUNTER — Other Ambulatory Visit: Payer: Self-pay | Admitting: Hematology and Oncology

## 2021-09-06 ENCOUNTER — Other Ambulatory Visit: Payer: Self-pay | Admitting: Oncology

## 2021-09-06 VITALS — BP 109/59 | HR 110 | Temp 98.2°F | Resp 20 | Ht 62.0 in | Wt 173.9 lb

## 2021-09-06 DIAGNOSIS — C50412 Malignant neoplasm of upper-outer quadrant of left female breast: Secondary | ICD-10-CM

## 2021-09-06 DIAGNOSIS — Z170 Estrogen receptor positive status [ER+]: Secondary | ICD-10-CM | POA: Diagnosis not present

## 2021-09-06 DIAGNOSIS — I890 Lymphedema, not elsewhere classified: Secondary | ICD-10-CM

## 2021-09-06 DIAGNOSIS — C7951 Secondary malignant neoplasm of bone: Secondary | ICD-10-CM

## 2021-09-06 LAB — BASIC METABOLIC PANEL
BUN: 14 (ref 4–21)
CO2: 21 (ref 13–22)
Chloride: 110 — AB (ref 99–108)
Creatinine: 0.6 (ref 0.5–1.1)
Glucose: 113
Potassium: 4.1 (ref 3.4–5.3)
Sodium: 139 (ref 137–147)

## 2021-09-06 LAB — COMPREHENSIVE METABOLIC PANEL
Albumin: 4.3 (ref 3.5–5.0)
Calcium: 8.8 (ref 8.7–10.7)

## 2021-09-06 LAB — HEPATIC FUNCTION PANEL
ALT: 23 (ref 7–35)
AST: 30 (ref 13–35)
Alkaline Phosphatase: 176 — AB (ref 25–125)
Bilirubin, Total: 0.8

## 2021-09-06 LAB — CBC
MCV: 79 — AB (ref 81–99)
RBC: 4.97 (ref 3.87–5.11)

## 2021-09-06 LAB — CBC AND DIFFERENTIAL
HCT: 39 (ref 36–46)
Hemoglobin: 12.3 (ref 12.0–16.0)
Neutrophils Absolute: 4.03
Platelets: 307 (ref 150–399)
WBC: 5.1

## 2021-09-06 NOTE — Telephone Encounter (Signed)
-----   Message from Derwood Kaplan, MD sent at 09/06/2021 12:11 PM EST ----- Regarding: OT Pls refer to Amy in OT regarding new lymphedema, new left carpal tunnel

## 2021-09-06 NOTE — Telephone Encounter (Signed)
Order faxed to Outpatient OT-to Amy.

## 2021-09-06 NOTE — Progress Notes (Signed)
Face to face visit with pt and her husband in [place]. Pt has completed radiation treatments and has had a port placed. She is scheduled to begin her chemo within the week. Pt is pleasant and positive. She is dreading chemo but realizes that it is what she must do and is ready to move forward.

## 2021-09-07 LAB — CANCER ANTIGEN 27.29: CA 27.29: 106.6 U/mL — ABNORMAL HIGH (ref 0.0–38.6)

## 2021-09-08 ENCOUNTER — Inpatient Hospital Stay: Payer: BC Managed Care – PPO

## 2021-09-08 ENCOUNTER — Other Ambulatory Visit: Payer: Self-pay

## 2021-09-08 ENCOUNTER — Ambulatory Visit: Payer: BC Managed Care – PPO

## 2021-09-08 VITALS — BP 130/67 | HR 118 | Temp 98.4°F | Resp 18 | Ht 62.0 in | Wt 172.1 lb

## 2021-09-08 DIAGNOSIS — C50412 Malignant neoplasm of upper-outer quadrant of left female breast: Secondary | ICD-10-CM | POA: Diagnosis not present

## 2021-09-08 DIAGNOSIS — C7951 Secondary malignant neoplasm of bone: Secondary | ICD-10-CM

## 2021-09-08 MED ORDER — DENOSUMAB 120 MG/1.7ML ~~LOC~~ SOLN
120.0000 mg | Freq: Once | SUBCUTANEOUS | Status: AC
  Administered 2021-09-08: 120 mg via SUBCUTANEOUS
  Filled 2021-09-08: qty 1.7

## 2021-09-08 NOTE — Patient Instructions (Signed)
Denosumab injection What is this medication? DENOSUMAB (den oh sue mab) slows bone breakdown. Prolia is used to treat osteoporosis in women after menopause and in men, and in people who are taking corticosteroids for 6 months or more. Delton See is used to treat a high calcium level due to cancer and to prevent bone fractures and other bone problems caused by multiple myeloma or cancer bone metastases. Delton See is also used to treat giant cell tumor of the bone. This medicine may be used for other purposes; ask your health care provider or pharmacist if you have questions. COMMON BRAND NAME(S): Prolia, XGEVA What should I tell my care team before I take this medication? They need to know if you have any of these conditions: dental disease having surgery or tooth extraction infection kidney disease low levels of calcium or Vitamin D in the blood malnutrition on hemodialysis skin conditions or sensitivity thyroid or parathyroid disease an unusual reaction to denosumab, other medicines, foods, dyes, or preservatives pregnant or trying to get pregnant breast-feeding How should I use this medication? This medicine is for injection under the skin. It is given by a health care professional in a hospital or clinic setting. A special MedGuide will be given to you before each treatment. Be sure to read this information carefully each time. For Prolia, talk to your pediatrician regarding the use of this medicine in children. Special care may be needed. For Delton See, talk to your pediatrician regarding the use of this medicine in children. While this drug may be prescribed for children as young as 13 years for selected conditions, precautions do apply. Overdosage: If you think you have taken too much of this medicine contact a poison control center or emergency room at once. NOTE: This medicine is only for you. Do not share this medicine with others. What if I miss a dose? It is important not to miss your dose.  Call your doctor or health care professional if you are unable to keep an appointment. What may interact with this medication? Do not take this medicine with any of the following medications: other medicines containing denosumab This medicine may also interact with the following medications: medicines that lower your chance of fighting infection steroid medicines like prednisone or cortisone This list may not describe all possible interactions. Give your health care provider a list of all the medicines, herbs, non-prescription drugs, or dietary supplements you use. Also tell them if you smoke, drink alcohol, or use illegal drugs. Some items may interact with your medicine. What should I watch for while using this medication? Visit your doctor or health care professional for regular checks on your progress. Your doctor or health care professional may order blood tests and other tests to see how you are doing. Call your doctor or health care professional for advice if you get a fever, chills or sore throat, or other symptoms of a cold or flu. Do not treat yourself. This drug may decrease your body's ability to fight infection. Try to avoid being around people who are sick. You should make sure you get enough calcium and vitamin D while you are taking this medicine, unless your doctor tells you not to. Discuss the foods you eat and the vitamins you take with your health care professional. See your dentist regularly. Brush and floss your teeth as directed. Before you have any dental work done, tell your dentist you are receiving this medicine. Do not become pregnant while taking this medicine or for 5 months after  stopping it. Talk with your doctor or health care professional about your birth control options while taking this medicine. Women should inform their doctor if they wish to become pregnant or think they might be pregnant. There is a potential for serious side effects to an unborn child. Talk to  your health care professional or pharmacist for more information. What side effects may I notice from receiving this medication? Side effects that you should report to your doctor or health care professional as soon as possible: allergic reactions like skin rash, itching or hives, swelling of the face, lips, or tongue bone pain breathing problems dizziness jaw pain, especially after dental work redness, blistering, peeling of the skin signs and symptoms of infection like fever or chills; cough; sore throat; pain or trouble passing urine signs of low calcium like fast heartbeat, muscle cramps or muscle pain; pain, tingling, numbness in the hands or feet; seizures unusual bleeding or bruising unusually weak or tired Side effects that usually do not require medical attention (report to your doctor or health care professional if they continue or are bothersome): constipation diarrhea headache joint pain loss of appetite muscle pain runny nose tiredness upset stomach This list may not describe all possible side effects. Call your doctor for medical advice about side effects. You may report side effects to FDA at 1-800-FDA-1088. Where should I keep my medication? This medicine is only given in a clinic, doctor's office, or other health care setting and will not be stored at home. NOTE: This sheet is a summary. It may not cover all possible information. If you have questions about this medicine, talk to your doctor, pharmacist, or health care provider.  2022 [employer] (2017-12-06 00:00:00)

## 2021-09-08 NOTE — Progress Notes (Signed)
1519:PT STABLE AT TIME OF DISCHARGE

## 2021-09-11 ENCOUNTER — Telehealth: Payer: Self-pay

## 2021-09-11 ENCOUNTER — Other Ambulatory Visit: Payer: Self-pay | Admitting: Hematology and Oncology

## 2021-09-11 ENCOUNTER — Other Ambulatory Visit: Payer: Self-pay | Admitting: Oncology

## 2021-09-11 ENCOUNTER — Ambulatory Visit: Payer: BC Managed Care – PPO

## 2021-09-11 DIAGNOSIS — Z170 Estrogen receptor positive status [ER+]: Secondary | ICD-10-CM

## 2021-09-11 DIAGNOSIS — R0789 Other chest pain: Secondary | ICD-10-CM

## 2021-09-11 DIAGNOSIS — C7951 Secondary malignant neoplasm of bone: Secondary | ICD-10-CM

## 2021-09-11 DIAGNOSIS — M545 Low back pain, unspecified: Secondary | ICD-10-CM

## 2021-09-11 DIAGNOSIS — C50412 Malignant neoplasm of upper-outer quadrant of left female breast: Secondary | ICD-10-CM

## 2021-09-11 MED ORDER — OMEPRAZOLE 40 MG PO CPDR: 40.0000 mg | DELAYED_RELEASE_CAPSULE | Freq: Every day | ORAL | 5 refills | Status: DC

## 2021-09-11 NOTE — Telephone Encounter (Signed)
Rolene Andrades,RN: I notified pt that Dr Hinton Rao is sending in the omeprazole.  Dr Hinton Rao: The Dexamethasone is ordered for the chemo when she starts that, I don't think she is taking any now.  Let's have her start omeprazole 40 mg daily, I can send in Rx.  Godfrey Tritschler,RN:  Pt returned my call. Pt states "it kinds feels like that" when I asked her about acid reflux. She isn't taking anything for reflux.  Dr Hinton Rao: She has a mass growing out of her sternum into the mediastinal area that Dr. Orlene Erm just radiated, I think it is from that.  Manuela Schwartz Phy,pharmacist: Denosumab can cause myalgias and bone pain.  Does the pain feel like it is coming from her sternum?  Does she ever have acid reflux?  Does this feel like that type of pain?  Orlando Devereux,RN:  Pt reports that when she swallows food or water, her chest hurts for about 2 seconds & then it stops. This just started happening when she got xgeva injection

## 2021-09-12 ENCOUNTER — Encounter: Payer: Self-pay | Admitting: Oncology

## 2021-09-14 ENCOUNTER — Telehealth: Payer: Self-pay | Admitting: Oncology

## 2021-09-14 ENCOUNTER — Other Ambulatory Visit: Payer: Self-pay | Admitting: Oncology

## 2021-09-14 DIAGNOSIS — C7951 Secondary malignant neoplasm of bone: Secondary | ICD-10-CM

## 2021-09-14 DIAGNOSIS — Z170 Estrogen receptor positive status [ER+]: Secondary | ICD-10-CM

## 2021-09-14 DIAGNOSIS — C50412 Malignant neoplasm of upper-outer quadrant of left female breast: Secondary | ICD-10-CM

## 2021-09-14 NOTE — Telephone Encounter (Signed)
09/14/21 spoke with patient and scheduled all appts

## 2021-09-15 ENCOUNTER — Encounter: Payer: Self-pay | Admitting: Oncology

## 2021-09-15 ENCOUNTER — Other Ambulatory Visit: Payer: Self-pay | Admitting: Hematology and Oncology

## 2021-09-15 ENCOUNTER — Other Ambulatory Visit: Payer: Self-pay | Admitting: Oncology

## 2021-09-15 DIAGNOSIS — C7951 Secondary malignant neoplasm of bone: Secondary | ICD-10-CM

## 2021-09-15 DIAGNOSIS — M25551 Pain in right hip: Secondary | ICD-10-CM

## 2021-09-15 DIAGNOSIS — C50412 Malignant neoplasm of upper-outer quadrant of left female breast: Secondary | ICD-10-CM

## 2021-09-15 NOTE — Progress Notes (Signed)
DISCONTINUE OFF PATHWAY REGIMEN - Breast   [id number]:TCHP (Docetaxel IV + Carboplatin IV + Trastuzumab IV + Pertuzumab IV) q21 Days x 6 Cycles:   Cycle 1: A cycle is 21 days:     Pertuzumab      Trastuzumab-xxxx      Docetaxel      Carboplatin    Cycles 2 through 6: A cycle is every 21 days:     Pertuzumab      Trastuzumab-xxxx      Docetaxel      Carboplatin   **Always confirm dose/schedule in your pharmacy ordering system**  REASON: Other Reason PRIOR TREATMENT: Off Pathway: TCHP (Docetaxel IV + Carboplatin IV + Trastuzumab IV + Pertuzumab IV) q21 Days x 6 Cycles TREATMENT RESPONSE: Unable to Evaluate  START ON PATHWAY REGIMEN - Breast     Cycle 1: A cycle is 21 days:     Pertuzumab      Trastuzumab-xxxx      Docetaxel    Cycles 2 and beyond: A cycle is every 21 days:     Pertuzumab      Trastuzumab-xxxx      Docetaxel   **Always confirm dose/schedule in your pharmacy ordering system**  Patient Characteristics: Distant Metastases or Locoregional Recurrent Disease - Unresected or Locally Advanced Unresectable Disease Progressing after Neoadjuvant and Local Therapies, HER2 Positive, ER Positive, Chemotherapy + HER2-Targeted Therapy, First Line Therapeutic Status: Distant Metastases HER2 Status: Positive (+) ER Status: Positive (+) PR Status: Positive (+) Line of Therapy: First Line Intent of Therapy: Non-Curative / Palliative Intent, Discussed with Patient

## 2021-09-15 NOTE — Progress Notes (Signed)
START OFF PATHWAY REGIMEN - Breast   [id number]:TCHP (Docetaxel IV + Carboplatin IV + Trastuzumab IV + Pertuzumab IV) q21 Days x 6 Cycles:   Cycle 1: A cycle is 21 days:     Pertuzumab      Trastuzumab-xxxx      Docetaxel      Carboplatin    Cycles 2 through 6: A cycle is every 21 days:     Pertuzumab      Trastuzumab-xxxx      Docetaxel      Carboplatin   **Always confirm dose/schedule in your pharmacy ordering system**  Patient Characteristics: Distant Metastases or Locoregional Recurrent Disease - Unresected or Locally Advanced Unresectable Disease Progressing after Neoadjuvant and Local Therapies, HER2 Positive, ER Positive, Chemotherapy + HER2-Targeted Therapy, First Line Therapeutic Status: Distant Metastases HER2 Status: Positive (+) ER Status: Positive (+) PR Status: Positive (+) Line of Therapy: First Line Intent of Therapy: Non-Curative / Palliative Intent, Discussed with Patient

## 2021-09-19 ENCOUNTER — Other Ambulatory Visit: Payer: Self-pay | Admitting: Hematology and Oncology

## 2021-09-19 DIAGNOSIS — Z170 Estrogen receptor positive status [ER+]: Secondary | ICD-10-CM

## 2021-09-19 DIAGNOSIS — C50412 Malignant neoplasm of upper-outer quadrant of left female breast: Secondary | ICD-10-CM

## 2021-09-20 ENCOUNTER — Other Ambulatory Visit: Payer: Self-pay

## 2021-09-20 ENCOUNTER — Inpatient Hospital Stay: Payer: BC Managed Care – PPO

## 2021-09-20 ENCOUNTER — Encounter: Payer: Self-pay | Admitting: Oncology

## 2021-09-20 ENCOUNTER — Encounter: Payer: Self-pay | Admitting: Hematology and Oncology

## 2021-09-20 ENCOUNTER — Inpatient Hospital Stay: Payer: BC Managed Care – PPO | Attending: Oncology | Admitting: Hematology and Oncology

## 2021-09-20 DIAGNOSIS — Z5189 Encounter for other specified aftercare: Secondary | ICD-10-CM | POA: Insufficient documentation

## 2021-09-20 DIAGNOSIS — C50412 Malignant neoplasm of upper-outer quadrant of left female breast: Secondary | ICD-10-CM | POA: Insufficient documentation

## 2021-09-20 DIAGNOSIS — Z170 Estrogen receptor positive status [ER+]: Secondary | ICD-10-CM | POA: Diagnosis not present

## 2021-09-20 DIAGNOSIS — L89152 Pressure ulcer of sacral region, stage 2: Secondary | ICD-10-CM

## 2021-09-20 DIAGNOSIS — Z5111 Encounter for antineoplastic chemotherapy: Secondary | ICD-10-CM | POA: Insufficient documentation

## 2021-09-20 DIAGNOSIS — C7951 Secondary malignant neoplasm of bone: Secondary | ICD-10-CM | POA: Insufficient documentation

## 2021-09-20 DIAGNOSIS — Z5112 Encounter for antineoplastic immunotherapy: Secondary | ICD-10-CM | POA: Insufficient documentation

## 2021-09-20 NOTE — Progress Notes (Signed)
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 She was on adjuvant hormonal therapy with tamoxifen 20 mg daily from October 2015 to May 2020, but due to elevation of the liver transaminases was switched to anastrazole.   Bone metastases ([place]) New bone metastases, November 2022, with multiple marrow replacing lesions within the proximal right femur within the ischium as well as the inferior rami. 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 Ki67 was 60%. PET imaging from December 19th revealed dominant finding of intensely hypermetabolic aggressive skeletal metastasis involving the calvarium, sternum, thoracic and lumbar spine, and pelvis, with potential pathologic fracture of the right femur with internal fixation. There is soft tissue extension into the anterior mediastinum associated with the manubrial metastasis. Multiple spinal vertebral body lesions have cortical destruction along the posterior margin. She started monthly Xgeva in December, and recently completed radiation therapy. We will plan for THP and hormonal therapy. She is due cycle 1 this week.    Pressure ulcer Bilateral sacral decubiti, which she has been using triple antibiotic ointment. We discussed the importance of continuing this, staying off the area and keeping the area covered  in order to heal. It appears to be improving today and she can proceed with treatment this week.    The patient understands the plans discussed today and is in agreement with them.  She knows to contact our office if she develops concerns prior to her next appointment.    Melodye Ped, NP  [facility] [address] [place] [address] Dept: [phone] Dept Fax: [phone]   No orders of the defined types were placed in this encounter.     CHIEF COMPLAINT:  CC: A 51 year old female with history of breast cancer here for pre-treatment evaluation  Current Treatment:  Trastuzumab, Pertuzumab, Docetaxel  INTERVAL HISTORY:  Anna [name] is here today for repeat clinical assessment. She denies fevers or chills. She denies pain. Her appetite is good. Her weight has been stable.  I have reviewed the past medical history, past surgical history, social history and family history with the patient and they are unchanged from previous note.  ALLERGIES:  has No Known Allergies.  MEDICATIONS:  Current Outpatient Medications  Medication Sig Dispense Refill   morphine (MS CONTIN) 15 MG 12 hr tablet TAKE ONE TABLET BY MOUTH EVERY 12 HOURS 60 tablet 0   albuterol (VENTOLIN HFA) 108 (90 Base) MCG/ACT inhaler Inhale 2 puffs into the lungs every 6 (six) hours as needed for shortness of breath.     ARIPiprazole (ABILIFY) 5 MG tablet Take 2.5 mg by mouth daily.     aspirin 81 MG EC tablet Take 81 mg by mouth daily. Swallow whole.     atorvastatin (LIPITOR) 20 MG tablet Take 1 tablet (20 mg total) by mouth daily. 90 tablet 3   celecoxib (CELEBREX) 200 MG capsule Take  1 capsule (200 mg total) by mouth 2 (two) times daily. To start this after finishing Xarelto 60 capsule 0   desvenlafaxine (PRISTIQ) 50 MG 24 hr tablet Take 50 mg by mouth daily.     dexamethasone (DECADRON) 4 MG tablet Take 2 tabs twice daily beginning the day before  chemotherapy and for 2 days after, every 3 weeks, by mouth 60 tablet 0   FARXIGA 10 MG TABS tablet Take 1 tablet (10 mg total) by mouth daily. 90 tablet 3   fluticasone (FLONASE) 50 MCG/ACT nasal spray Place 1 spray into both nostrils daily.     labetalol (NORMODYNE) 200 MG tablet Take 1 tablet (200 mg total) by mouth 2 (two) times daily. 180 tablet 3   Loratadine 10 MG CAPS Take 1 capsule (10 mg total) by mouth daily. 30 capsule 6   methocarbamol (ROBAXIN) 500 MG tablet Take 1 tablet (500 mg total) by mouth every 6 (six) hours as needed for muscle spasms. 40 tablet 0   nitroGLYCERIN (NITROSTAT) 0.4 MG SL tablet Place 0.4 mg under the tongue every 5 (five) minutes as needed for chest pain.     omeprazole (PRILOSEC) 40 MG capsule Take 1 capsule (40 mg total) by mouth daily. 30 capsule 5   ondansetron (ZOFRAN) 4 MG tablet Take 1 tablet (4 mg total) by mouth every 4 (four) hours as needed for nausea. 90 tablet 3   Oxycodone HCl 10 MG TABS TAKE ONE TABLET BY MOUTH EVERY 4 HOURS AS NEEDED 120 tablet 0   OZEMPIC, 0.25 OR 0.5 MG/DOSE, 2 MG/1.5ML SOPN Inject into the skin.     prochlorperazine (COMPAZINE) 10 MG tablet Take 1 tablet (10 mg total) by mouth every 6 (six) hours as needed for nausea or vomiting. 90 tablet 3   senna (SENOKOT) 8.6 MG TABS tablet Take 2 tablets by mouth 2 (two) times daily.     topiramate (TOPAMAX) 50 MG tablet Take 50 mg by mouth daily.     valsartan (DIOVAN) 40 MG tablet Take 1 tablet (40 mg total) by mouth daily. 90 tablet 3   Vitamin D, Ergocalciferol, (DRISDOL) 1.25 MG (50000 UNIT) CAPS capsule Take 50,000 Units by mouth once a week.     Current Facility-Administered Medications  Medication Dose Route Frequency Provider Last Rate Last Admin   0.9 %  sodium chloride infusion  500 mL Intravenous Once Jackquline Denmark, MD       technetium sestamibi generic (CARDIOLITE) injection 35.3 millicurie  91.2 millicurie Intravenous Once PRN Revankar, Reita Cliche, MD        HISTORY OF  PRESENT ILLNESS:   Oncology History  Malignant neoplasm of upper-outer quadrant of left female breast ([place])  07/23/2013 Initial Diagnosis   Malignant neoplasm of upper-outer quadrant of left female breast ([place])   07/23/2013 Cancer Staging   Staging form: Breast, AJCC 7th Edition - Clinical stage from 07/23/2013: Stage IIB (T2, N1, M0) - Signed by Derwood Kaplan, MD on 07/04/2020 Prognostic indicators: Pos LVI    09/22/2021 -  Chemotherapy   Patient is on Treatment Plan : BREAST DOCEtaxel + Trastuzumab + Pertuzumab (THP) q21d x 8 cycles / Trastuzumab + Pertuzumab q21d x 4 cycles     Bone metastases ([place])  06/28/2021 Initial Diagnosis   Bone metastases ([place])   09/22/2021 -  Chemotherapy   Patient is on Treatment Plan : BREAST DOCEtaxel + Trastuzumab + Pertuzumab (THP) q21d x 8 cycles / Trastuzumab + Pertuzumab q21d x 4 cycles  REVIEW OF SYSTEMS:   Constitutional: Denies fevers, chills or abnormal weight loss Eyes: Denies blurriness of vision Ears, nose, mouth, throat, and face: Denies mucositis or sore throat Respiratory: Denies cough, dyspnea or wheezes Cardiovascular: Denies palpitation, chest discomfort or lower extremity swelling Gastrointestinal:  Denies nausea, heartburn or change in bowel habits Skin: Denies abnormal skin rashes Lymphatics: Denies new lymphadenopathy or easy bruising Neurological:Denies numbness, tingling or new weaknesses Behavioral/Psych: Mood is stable, no new changes  All other systems were reviewed with the patient and are negative.   VITALS:  Blood pressure 99/63, pulse (!) 110, temperature 98.5 F (36.9 C), temperature source Oral, resp. rate 18, height 5' 2"  (1.575 m), weight 171 lb 3.2 oz (77.7 kg), last menstrual period 08/18/2013, SpO2 94 %.  Wt Readings from Last 3 Encounters:  09/20/21 171 lb 3.2 oz (77.7 kg)  09/08/21 172 lb 2 oz (78.1 kg)  09/06/21 173 lb 14.4 oz (78.9 kg)    Body mass index is 31.31  kg/m.  Performance status (ECOG): 1 - Symptomatic but completely ambulatory  PHYSICAL EXAM:   GENERAL:alert, no distress and comfortable SKIN: skin color, texture, turgor are normal, no rashes or significant lesions EYES: normal, Conjunctiva are pink and non-injected, sclera clear OROPHARYNX:no exudate, no erythema and lips, buccal mucosa, and tongue normal  NECK: supple, thyroid normal size, non-tender, without nodularity LYMPH:  no palpable lymphadenopathy in the cervical, axillary or inguinal LUNGS: clear to auscultation and percussion with normal breathing effort HEART: regular rate & rhythm and no murmurs and no lower extremity edema ABDOMEN:abdomen soft, non-tender and normal bowel sounds Musculoskeletal:no cyanosis of digits and no clubbing  NEURO: alert & oriented x 3 with fluent speech, no focal motor/sensory deficits  LABORATORY DATA:  I have reviewed the data as listed    Component Value Date/Time   NA 139 09/06/2021 0000   K 4.1 09/06/2021 0000   CL 110 (A) 09/06/2021 0000   CO2 21 09/06/2021 0000   GLUCOSE 147 (H) 07/05/2021 0330   BUN 14 09/06/2021 0000   CREATININE 0.6 09/06/2021 0000   CREATININE 0.71 07/05/2021 0330   CALCIUM 8.8 09/06/2021 0000   PROT 7.7 06/30/2021 0833   ALBUMIN 4.3 09/06/2021 0000   AST 30 09/06/2021 0000   ALT 23 09/06/2021 0000   ALKPHOS 176 (A) 09/06/2021 0000   BILITOT 0.7 06/30/2021 0833   GFRNONAA >60 07/05/2021 0330    No results found for: SPEP, UPEP  Lab Results  Component Value Date   WBC 5.1 09/06/2021   NEUTROABS 4.03 09/06/2021   HGB 12.3 09/06/2021   HCT 39 09/06/2021   MCV 79 (A) 09/06/2021   PLT 307 09/06/2021      Chemistry      Component Value Date/Time   NA 139 09/06/2021 0000   K 4.1 09/06/2021 0000   CL 110 (A) 09/06/2021 0000   CO2 21 09/06/2021 0000   BUN 14 09/06/2021 0000   CREATININE 0.6 09/06/2021 0000   CREATININE 0.71 07/05/2021 0330   GLU 113 09/06/2021 0000      Component Value  Date/Time   CALCIUM 8.8 09/06/2021 0000   ALKPHOS 176 (A) 09/06/2021 0000   AST 30 09/06/2021 0000   ALT 23 09/06/2021 0000   BILITOT 0.7 06/30/2021 0833       RADIOGRAPHIC STUDIES: I have personally reviewed the radiological images as listed and agreed with the findings in the report. No results found.

## 2021-09-20 NOTE — Assessment & Plan Note (Signed)
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 She was on adjuvant hormonal therapy with tamoxifen 20 mg daily from October 2015 to May 2020, but due to elevation of the liver transaminases was switched to anastrazole.

## 2021-09-20 NOTE — Assessment & Plan Note (Signed)
New bone metastases, November 2022, with multiple marrow replacing lesions within the proximal right femur within the ischium as well as the inferior rami. 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 Ki67 was 60%. PET imaging from December 19th revealed dominant finding of intensely hypermetabolic aggressive skeletal metastasis involving the calvarium, sternum, thoracic and lumbar spine, and pelvis, with potential pathologic fracture of the right femur with internal fixation. There is soft tissue extension into the anterior mediastinum associated with the manubrial metastasis. Multiple spinal vertebral body lesions have cortical destruction along the posterior margin. She started monthly Xgeva in December, and recently completed radiation therapy. We will plan for THP and hormonal therapy. She is due cycle 1 this week.

## 2021-09-20 NOTE — Assessment & Plan Note (Signed)
Bilateral sacral decubiti, which she has been using triple antibiotic ointment. We discussed the importance of continuing this, staying off the area and keeping the area covered in order to heal. It appears to be improving today and she can proceed with treatment this week.

## 2021-09-20 NOTE — Progress Notes (Signed)
Face to face with pt and her husband in lobby. Pt reports that she is having some back pain today but believes it may be related to the weather. Gave pt  a copy of "Recurrent and Metastatic Breast Cancer Treatment Handbook".

## 2021-09-21 ENCOUNTER — Other Ambulatory Visit: Payer: Self-pay

## 2021-09-21 ENCOUNTER — Encounter: Payer: Self-pay | Admitting: Oncology

## 2021-09-21 DIAGNOSIS — Z170 Estrogen receptor positive status [ER+]: Secondary | ICD-10-CM

## 2021-09-21 DIAGNOSIS — C50412 Malignant neoplasm of upper-outer quadrant of left female breast: Secondary | ICD-10-CM

## 2021-09-21 MED FILL — Trastuzumab-dkst For IV Soln 150 MG: INTRAVENOUS | Qty: 30 | Status: AC

## 2021-09-21 MED FILL — Pertuzumab Soln for IV Infusion 420 MG/14ML (30 MG/ML): INTRAVENOUS | Qty: 28 | Status: AC

## 2021-09-21 MED FILL — Docetaxel Soln for IV Infusion 160 MG/16ML: INTRAVENOUS | Qty: 14 | Status: AC

## 2021-09-21 NOTE — Progress Notes (Signed)
..  Pharmacist Chemotherapy Monitoring - Initial Assessment    Anticipated start date: 09/22/21  The following has been reviewed per standard work regarding the patient's treatment regimen: The patient's diagnosis, treatment plan and drug doses, and organ/hematologic function Lab orders and baseline tests specific to treatment regimen  The treatment plan start date, drug sequencing, and pre-medications Prior authorization status  Patient's documented medication list, including drug-drug interaction screen and prescriptions for anti-emetics and supportive care specific to the treatment regimen The drug concentrations, fluid compatibility, administration routes, and timing of the medications to be used The patient's access for treatment and lifetime cumulative dose history, if applicable  The patient's medication allergies and previous infusion related reactions, if applicable   Changes made to treatment plan:  Magnesium ordered to monitor with pertuzumab/trastuzumab treatment  Follow up needed:  N/A   Juanetta Beets, RPH, 09/21/2021  1:21 PM

## 2021-09-22 ENCOUNTER — Inpatient Hospital Stay: Payer: BC Managed Care – PPO

## 2021-09-22 ENCOUNTER — Other Ambulatory Visit: Payer: Self-pay | Admitting: Hematology and Oncology

## 2021-09-22 ENCOUNTER — Other Ambulatory Visit: Payer: Self-pay

## 2021-09-22 VITALS — BP 106/63 | HR 108 | Temp 97.9°F | Resp 18 | Ht 62.0 in | Wt 168.0 lb

## 2021-09-22 DIAGNOSIS — C50412 Malignant neoplasm of upper-outer quadrant of left female breast: Secondary | ICD-10-CM | POA: Diagnosis present

## 2021-09-22 DIAGNOSIS — C7951 Secondary malignant neoplasm of bone: Secondary | ICD-10-CM | POA: Diagnosis not present

## 2021-09-22 DIAGNOSIS — Z5112 Encounter for antineoplastic immunotherapy: Secondary | ICD-10-CM | POA: Diagnosis present

## 2021-09-22 DIAGNOSIS — Z5189 Encounter for other specified aftercare: Secondary | ICD-10-CM | POA: Diagnosis not present

## 2021-09-22 DIAGNOSIS — Z170 Estrogen receptor positive status [ER+]: Secondary | ICD-10-CM | POA: Diagnosis not present

## 2021-09-22 DIAGNOSIS — Z5111 Encounter for antineoplastic chemotherapy: Secondary | ICD-10-CM | POA: Diagnosis present

## 2021-09-22 MED ORDER — SODIUM CHLORIDE 0.9 % IV SOLN
75.0000 mg/m2 | Freq: Once | INTRAVENOUS | Status: AC
  Administered 2021-09-22: 140 mg via INTRAVENOUS
  Filled 2021-09-22: qty 14

## 2021-09-22 MED ORDER — SODIUM CHLORIDE 0.9 % IV SOLN: Freq: Once | INTRAVENOUS | Status: AC

## 2021-09-22 MED ORDER — TRASTUZUMAB-DKST CHEMO 150 MG IV SOLR
8.0000 mg/kg | Freq: Once | INTRAVENOUS | Status: AC
  Administered 2021-09-22: 630 mg via INTRAVENOUS
  Filled 2021-09-22: qty 30

## 2021-09-22 MED ORDER — DIPHENHYDRAMINE HCL 25 MG PO CAPS
50.0000 mg | ORAL_CAPSULE | Freq: Once | ORAL | Status: AC
  Administered 2021-09-22: 50 mg via ORAL
  Filled 2021-09-22: qty 2

## 2021-09-22 MED ORDER — ACETAMINOPHEN 325 MG PO TABS
650.0000 mg | ORAL_TABLET | Freq: Once | ORAL | Status: AC
  Administered 2021-09-22: 650 mg via ORAL
  Filled 2021-09-22: qty 2

## 2021-09-22 MED ORDER — SODIUM CHLORIDE 0.9% FLUSH
10.0000 mL | INTRAVENOUS | Status: DC | PRN
  Administered 2021-09-22: 10 mL

## 2021-09-22 MED ORDER — LORAZEPAM 2 MG/ML IJ SOLN
1.0000 mg | Freq: Once | INTRAMUSCULAR | Status: AC
  Administered 2021-09-22: 1 mg via INTRAVENOUS
  Filled 2021-09-22: qty 1

## 2021-09-22 MED ORDER — SODIUM CHLORIDE 0.9 % IV SOLN
10.0000 mg | Freq: Once | INTRAVENOUS | Status: AC
  Administered 2021-09-22: 10 mg via INTRAVENOUS
  Filled 2021-09-22: qty 1

## 2021-09-22 MED ORDER — HEPARIN SOD (PORK) LOCK FLUSH 100 UNIT/ML IV SOLN
500.0000 [IU] | Freq: Once | INTRAVENOUS | Status: AC | PRN
  Administered 2021-09-22: 500 [IU]

## 2021-09-22 MED ORDER — SODIUM CHLORIDE 0.9 % IV SOLN
840.0000 mg | Freq: Once | INTRAVENOUS | Status: AC
  Administered 2021-09-22: 840 mg via INTRAVENOUS
  Filled 2021-09-22: qty 28

## 2021-09-22 NOTE — Patient Instructions (Signed)
Pertuzumab injection What is this medication? PERTUZUMAB (per TOOZ ue mab) is a monoclonal antibody. It is used to treat breast cancer. This medicine may be used for other purposes; ask your health care provider or pharmacist if you have questions. COMMON BRAND NAME(S): PERJETA What should I tell my care team before I take this medication? They need to know if you have any of these conditions: heart disease heart failure high blood pressure history of irregular heart beat recent or ongoing radiation therapy an unusual or allergic reaction to pertuzumab, other medicines, foods, dyes, or preservatives pregnant or trying to get pregnant breast-feeding How should I use this medication? This medicine is for infusion into a vein. It is given by a health care professional in a hospital or clinic setting. Talk to your pediatrician regarding the use of this medicine in children. Special care may be needed. Overdosage: If you think you have taken too much of this medicine contact a poison control center or emergency room at once. NOTE: This medicine is only for you. Do not share this medicine with others. What if I miss a dose? It is important not to miss your dose. Call your doctor or health care professional if you are unable to keep an appointment. What may interact with this medication? Interactions are not expected. Give your health care provider a list of all the medicines, herbs, non-prescription drugs, or dietary supplements you use. Also tell them if you smoke, drink alcohol, or use illegal drugs. Some items may interact with your medicine. This list may not describe all possible interactions. Give your health care provider a list of all the medicines, herbs, non-prescription drugs, or dietary supplements you use. Also tell them if you smoke, drink alcohol, or use illegal drugs. Some items may interact with your medicine. What should I watch for while using this medication? Your condition  will be monitored carefully while you are receiving this medicine. Report any side effects. Continue your course of treatment even though you feel ill unless your doctor tells you to stop. Do not become pregnant while taking this medicine or for 7 months after stopping it. Women should inform their doctor if they wish to become pregnant or think they might be pregnant. Women of child-bearing potential will need to have a negative pregnancy test before starting this medicine. There is a potential for serious side effects to an unborn child. Talk to your health care professional or pharmacist for more information. Do not breast-feed an infant while taking this medicine or for 7 months after stopping it. Women must use effective birth control with this medicine. Call your doctor or health care professional for advice if you get a fever, chills or sore throat, or other symptoms of a cold or flu. Do not treat yourself. Try to avoid being around people who are sick. You may experience fever, chills, and headache during the infusion. Report any side effects during the infusion to your health care professional. What side effects may I notice from receiving this medication? Side effects that you should report to your doctor or health care professional as soon as possible: breathing problems chest pain or palpitations dizziness feeling faint or lightheaded fever or chills skin rash, itching or hives sore throat swelling of the face, lips, or tongue swelling of the legs or ankles unusually weak or tired Side effects that usually do not require medical attention (report to your doctor or health care professional if they continue or are bothersome): diarrhea hair loss  nausea, vomiting tiredness This list may not describe all possible side effects. Call your doctor for medical advice about side effects. You may report side effects to FDA at 1-800-FDA-1088. Where should I keep my medication? This drug is  given in a hospital or clinic and will not be stored at home. NOTE: This sheet is a summary. It may not cover all possible information. If you have questions about this medicine, talk to your doctor, pharmacist, or health care provider.  2022 Elsevier/Gold Standard (2015-09-01 00:00:00) Trastuzumab injection for infusion What is this medication? TRASTUZUMAB (tras TOO zoo mab) is a monoclonal antibody. It is used to treat breast cancer and stomach cancer. This medicine may be used for other purposes; ask your health care provider or pharmacist if you have questions. COMMON BRAND NAME(S): Herceptin, Janae Bridgeman, Ontruzant, Trazimera What should I tell my care team before I take this medication? They need to know if you have any of these conditions: heart disease heart failure lung or breathing disease, like asthma an unusual or allergic reaction to trastuzumab, benzyl alcohol, or other medications, foods, dyes, or preservatives pregnant or trying to get pregnant breast-feeding How should I use this medication? This drug is given as an infusion into a vein. It is administered in a hospital or clinic by a specially trained health care professional. Talk to your pediatrician regarding the use of this medicine in children. This medicine is not approved for use in children. Overdosage: If you think you have taken too much of this medicine contact a poison control center or emergency room at once. NOTE: This medicine is only for you. Do not share this medicine with others. What if I miss a dose? It is important not to miss a dose. Call your doctor or health care professional if you are unable to keep an appointment. What may interact with this medication? This medicine may interact with the following medications: certain types of chemotherapy, such as daunorubicin, doxorubicin, epirubicin, and idarubicin This list may not describe all possible interactions. Give your health care  provider a list of all the medicines, herbs, non-prescription drugs, or dietary supplements you use. Also tell them if you smoke, drink alcohol, or use illegal drugs. Some items may interact with your medicine. What should I watch for while using this medication? Visit your doctor for checks on your progress. Report any side effects. Continue your course of treatment even though you feel ill unless your doctor tells you to stop. Call your doctor or health care professional for advice if you get a fever, chills or sore throat, or other symptoms of a cold or flu. Do not treat yourself. Try to avoid being around people who are sick. You may experience fever, chills and shaking during your first infusion. These effects are usually mild and can be treated with other medicines. Report any side effects during the infusion to your health care professional. Fever and chills usually do not happen with later infusions. Do not become pregnant while taking this medicine or for 7 months after stopping it. Women should inform their doctor if they wish to become pregnant or think they might be pregnant. Women of child-bearing potential will need to have a negative pregnancy test before starting this medicine. There is a potential for serious side effects to an unborn child. Talk to your health care professional or pharmacist for more information. Do not breast-feed an infant while taking this medicine or for 7 months after stopping it. Women must use  effective birth control with this medicine. What side effects may I notice from receiving this medication? Side effects that you should report to your doctor or health care professional as soon as possible: allergic reactions like skin rash, itching or hives, swelling of the face, lips, or tongue chest pain or palpitations cough dizziness feeling faint or lightheaded, falls fever general ill feeling or flu-like symptoms signs of worsening heart failure like breathing  problems; swelling in your legs and feet unusually weak or tired Side effects that usually do not require medical attention (report to your doctor or health care professional if they continue or are bothersome): bone pain changes in taste diarrhea joint pain nausea/vomiting weight loss This list may not describe all possible side effects. Call your doctor for medical advice about side effects. You may report side effects to FDA at 1-800-FDA-1088. Where should I keep my medication? This drug is given in a hospital or clinic and will not be stored at home. NOTE: This sheet is a summary. It may not cover all possible information. If you have questions about this medicine, talk to your doctor, pharmacist, or health care provider.  2022 Elsevier/Gold Standard (2016-08-14 00:00:00) Docetaxel injection What is this medication? DOCETAXEL (doe se TAX el) is a chemotherapy drug. It targets fast dividing cells, like cancer cells, and causes these cells to die. This medicine is used to treat many types of cancers like breast cancer, certain stomach cancers, head and neck cancer, lung cancer, and prostate cancer. This medicine may be used for other purposes; ask your health care provider or pharmacist if you have questions. COMMON BRAND NAME(S): Docefrez, Taxotere What should I tell my care team before I take this medication? They need to know if you have any of these conditions: infection (especially a virus infection such as chickenpox, cold sores, or herpes) liver disease low blood counts, like low white cell, platelet, or red cell counts an unusual or allergic reaction to docetaxel, polysorbate 80, other chemotherapy agents, other medicines, foods, dyes, or preservatives pregnant or trying to get pregnant breast-feeding How should I use this medication? This drug is given as an infusion into a vein. It is administered in a hospital or clinic by a specially trained health care professional. Talk  to your pediatrician regarding the use of this medicine in children. Special care may be needed. Overdosage: If you think you have taken too much of this medicine contact a poison control center or emergency room at once. NOTE: This medicine is only for you. Do not share this medicine with others. What if I miss a dose? It is important not to miss your dose. Call your doctor or health care professional if you are unable to keep an appointment. What may interact with this medication? Do not take this medicine with any of the following medications: live virus vaccines This medicine may also interact with the following medications: aprepitant certain antibiotics like erythromycin or clarithromycin certain antivirals for HIV or hepatitis certain medicines for fungal infections like fluconazole, itraconazole, ketoconazole, posaconazole, or voriconazole cimetidine ciprofloxacin conivaptan cyclosporine dronedarone fluvoxamine grapefruit juice imatinib verapamil This list may not describe all possible interactions. Give your health care provider a list of all the medicines, herbs, non-prescription drugs, or dietary supplements you use. Also tell them if you smoke, drink alcohol, or use illegal drugs. Some items may interact with your medicine. What should I watch for while using this medication? Your condition will be monitored carefully while you are receiving this medicine.  You will need important blood work done while you are taking this medicine. Call your doctor or health care professional for advice if you get a fever, chills or sore throat, or other symptoms of a cold or flu. Do not treat yourself. This drug decreases your body's ability to fight infections. Try to avoid being around people who are sick. Some products may contain alcohol. Ask your health care professional if this medicine contains alcohol. Be sure to tell all health care professionals you are taking this medicine. Certain  medicines, like metronidazole and disulfiram, can cause an unpleasant reaction when taken with alcohol. The reaction includes flushing, headache, nausea, vomiting, sweating, and increased thirst. The reaction can last from 30 minutes to several hours. You may get drowsy or dizzy. Do not drive, use machinery, or do anything that needs mental alertness until you know how this medicine affects you. Do not stand or sit up quickly, especially if you are an older patient. This reduces the risk of dizzy or fainting spells. Alcohol may interfere with the effect of this medicine. Talk to your health care professional about your risk of cancer. You may be more at risk for certain types of cancer if you take this medicine. Do not become pregnant while taking this medicine or for 6 months after stopping it. Women should inform their doctor if they wish to become pregnant or think they might be pregnant. There is a potential for serious side effects to an unborn child. Talk to your health care professional or pharmacist for more information. Do not breast-feed an infant while taking this medicine or for 1 week after stopping it. Males who get this medicine must use a condom during sex with females who can get pregnant. If you get a woman pregnant, the baby could have birth defects. The baby could die before they are born. You will need to continue wearing a condom for 3 months after stopping the medicine. Tell your health care provider right away if your partner becomes pregnant while you are taking this medicine. This may interfere with the ability to father a child. You should talk to your doctor or health care professional if you are concerned about your fertility. What side effects may I notice from receiving this medication? Side effects that you should report to your doctor or health care professional as soon as possible: allergic reactions like skin rash, itching or hives, swelling of the face, lips, or  tongue blurred vision breathing problems changes in vision low blood counts - This drug may decrease the number of white blood cells, red blood cells and platelets. You may be at increased risk for infections and bleeding. nausea and vomiting pain, redness or irritation at site where injected pain, tingling, numbness in the hands or feet redness, blistering, peeling, or loosening of the skin, including inside the mouth signs of decreased platelets or bleeding - bruising, pinpoint red spots on the skin, black, tarry stools, nosebleeds signs of decreased red blood cells - unusually weak or tired, fainting spells, lightheadedness signs of infection - fever or chills, cough, sore throat, pain or difficulty passing urine swelling of the ankle, feet, hands Side effects that usually do not require medical attention (report to your doctor or health care professional if they continue or are bothersome): constipation diarrhea fingernail or toenail changes hair loss loss of appetite mouth sores muscle pain This list may not describe all possible side effects. Call your doctor for medical advice about side effects. You may  report side effects to FDA at 1-800-FDA-1088. Where should I keep my medication? This drug is given in a hospital or clinic and will not be stored at home. NOTE: This sheet is a summary. It may not cover all possible information. If you have questions about this medicine, talk to your doctor, pharmacist, or health care provider.  2022 Elsevier/Gold Standard (2021-04-18 00:00:00)

## 2021-09-25 ENCOUNTER — Inpatient Hospital Stay: Payer: BC Managed Care – PPO

## 2021-09-25 ENCOUNTER — Other Ambulatory Visit: Payer: Self-pay

## 2021-09-25 VITALS — BP 123/64 | HR 105 | Temp 98.9°F | Resp 18 | Ht 62.0 in | Wt 167.8 lb

## 2021-09-25 DIAGNOSIS — Z5111 Encounter for antineoplastic chemotherapy: Secondary | ICD-10-CM | POA: Diagnosis not present

## 2021-09-25 DIAGNOSIS — C7951 Secondary malignant neoplasm of bone: Secondary | ICD-10-CM

## 2021-09-25 DIAGNOSIS — C50412 Malignant neoplasm of upper-outer quadrant of left female breast: Secondary | ICD-10-CM

## 2021-09-25 MED ORDER — PEGFILGRASTIM-CBQV 6 MG/0.6ML ~~LOC~~ SOSY
6.0000 mg | PREFILLED_SYRINGE | Freq: Once | SUBCUTANEOUS | Status: AC
  Administered 2021-09-25: 6 mg via SUBCUTANEOUS
  Filled 2021-09-25: qty 0.6

## 2021-09-25 NOTE — Patient Instructions (Signed)

## 2021-09-27 ENCOUNTER — Telehealth: Payer: Self-pay

## 2021-09-27 NOTE — Telephone Encounter (Addendum)
I spoke with pt to see how she is doing since first infusion. She reports having a sore throat and SOB little worse. Her spouse looked in her throat while we were on the phone. He didn't see any type of bumps/sores. No fever. No cough. Diarrhea has subsided to about once per day with antidiarrheal. Nausea managed with antiemetics. No emesis. No rashes. Appetite ok.  Melissa,NP, asked me to call in some MMW for pt to use. I called [place] and gave them directions for the MMW (mixture of equal parts, of mylanta, benadryl, and lidocaine. Pt to take 73m swish and spit q 3-4 hr PRN throat/mouth pain disp [id number] refills).

## 2021-09-29 ENCOUNTER — Other Ambulatory Visit: Payer: Self-pay | Admitting: Hematology and Oncology

## 2021-09-29 ENCOUNTER — Telehealth: Payer: Self-pay

## 2021-09-29 MED ORDER — NYSTATIN 100000 UNIT/ML MT SUSP: 5.0000 mL | Freq: Four times a day (QID) | OROMUCOSAL | 0 refills | Status: DC

## 2021-09-29 NOTE — Telephone Encounter (Signed)
Left VM on Gena Fray identified VM after talking to Lenna Sciara, NP about message left on Nurse line.  She send in Nystatin suspension to her pharmacy instructed me to make sure she swallows this medication not just swish and spit.  I told them to call back is she has chills, fever, cough, nausea, vomiting, constipation or diarrhea.

## 2021-10-02 ENCOUNTER — Inpatient Hospital Stay (INDEPENDENT_AMBULATORY_CARE_PROVIDER_SITE_OTHER): Payer: BC Managed Care – PPO | Admitting: Hematology and Oncology

## 2021-10-02 ENCOUNTER — Encounter: Payer: Self-pay | Admitting: Hematology and Oncology

## 2021-10-02 ENCOUNTER — Other Ambulatory Visit: Payer: Self-pay

## 2021-10-02 ENCOUNTER — Inpatient Hospital Stay: Payer: BC Managed Care – PPO

## 2021-10-02 DIAGNOSIS — C7951 Secondary malignant neoplasm of bone: Secondary | ICD-10-CM

## 2021-10-02 DIAGNOSIS — R0789 Other chest pain: Secondary | ICD-10-CM | POA: Diagnosis not present

## 2021-10-02 DIAGNOSIS — L89152 Pressure ulcer of sacral region, stage 2: Secondary | ICD-10-CM

## 2021-10-02 DIAGNOSIS — C50412 Malignant neoplasm of upper-outer quadrant of left female breast: Secondary | ICD-10-CM | POA: Diagnosis not present

## 2021-10-02 DIAGNOSIS — Z170 Estrogen receptor positive status [ER+]: Secondary | ICD-10-CM

## 2021-10-02 DIAGNOSIS — M545 Low back pain, unspecified: Secondary | ICD-10-CM

## 2021-10-02 LAB — COMPREHENSIVE METABOLIC PANEL
Albumin: 3.7 (ref 3.5–5.0)
Calcium: 8.4 — AB (ref 8.7–10.7)

## 2021-10-02 LAB — CBC: RBC: 4.6 (ref 3.87–5.11)

## 2021-10-02 LAB — BASIC METABOLIC PANEL
BUN: 9 (ref 4–21)
CO2: 27 — AB (ref 13–22)
Chloride: 106 (ref 99–108)
Creatinine: 0.6 (ref 0.5–1.1)
Glucose: 122
Potassium: 3.2 — AB (ref 3.4–5.3)
Sodium: 140 (ref 137–147)

## 2021-10-02 LAB — HEPATIC FUNCTION PANEL
ALT: 29 (ref 7–35)
AST: 27 (ref 13–35)
Alkaline Phosphatase: 241 — AB (ref 25–125)
Bilirubin, Total: 0.5

## 2021-10-02 LAB — CBC AND DIFFERENTIAL
HCT: 35 — AB (ref 36–46)
Hemoglobin: 11.2 — AB (ref 12.0–16.0)
Neutrophils Absolute: 14.59
Platelets: 213 (ref 150–399)
WBC: 15.2

## 2021-10-02 MED ORDER — POTASSIUM CHLORIDE CRYS ER 20 MEQ PO TBCR: 20.0000 meq | EXTENDED_RELEASE_TABLET | Freq: Two times a day (BID) | ORAL | 0 refills | Status: DC

## 2021-10-02 MED ORDER — BACTROBAN NASAL 2 % NA OINT: 1.0000 "application " | TOPICAL_OINTMENT | Freq: Two times a day (BID) | NASAL | 0 refills | Status: DC

## 2021-10-02 MED ORDER — OXYCODONE HCL 10 MG PO TABS: 10.0000 mg | ORAL_TABLET | ORAL | 0 refills | Status: DC | PRN

## 2021-10-02 MED ORDER — FLUCONAZOLE 150 MG PO TABS: 150.0000 mg | ORAL_TABLET | Freq: Every day | ORAL | 3 refills | Status: DC

## 2021-10-02 MED ORDER — LORAZEPAM 1 MG PO TABS: 1.0000 mg | ORAL_TABLET | Freq: Three times a day (TID) | ORAL | 0 refills | Status: DC

## 2021-10-02 MED ORDER — NYSTATIN 100000 UNIT/ML MT SUSP: 5.0000 mL | Freq: Four times a day (QID) | OROMUCOSAL | 0 refills | Status: DC

## 2021-10-02 NOTE — Progress Notes (Signed)
Patient has a rash around her mouth, nose and some on her face. It looks like whitehead but also when they pop, they are crusty. Patient also thinks she has some in her nose cause her nose is sore on the inside. Dayton Scrape, FNP-BC aware.

## 2021-10-02 NOTE — Progress Notes (Signed)
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 She was on adjuvant hormonal therapy with tamoxifen 20 mg daily from October 2015 to May 2020, but due to elevation of the liver transaminases was switched to anastrazole.   Bone metastases ([place]) New bone metastases, November 2022, with multiple marrow replacing lesions within the proximal right femur within the ischium as well as the inferior rami. 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 Ki67 was 60%. PET imaging from December 19th revealed dominant finding of intensely hypermetabolic aggressive skeletal metastasis involving the calvarium, sternum, thoracic and lumbar spine, and pelvis, with potential pathologic fracture of the right femur with internal fixation. There is soft tissue extension into the anterior mediastinum associated with the manubrial metastasis. Multiple spinal vertebral body lesions have cortical destruction along the posterior margin. She started monthly Xgeva in December, and recently completed radiation therapy. We will plan for THP and hormonal therapy. She tolerated cycle 1 fairly well. She continues to have mouth sores and notes sores inside her nose. She has had ongoing diarrhea with some nausea and anxiety. I will send in ativan for both nausea and anxiety. I will also send  in [place] for her nose. She will return to clinic on 03/01 prior to cycle 2.     Pressure ulcer This has healed completely.   The patient understands the plans discussed today and is in agreement with them.  She knows to contact our office if she develops concerns prior to her next appointment.     Melodye Ped, NP  [facility] [address] [place] [address] Dept: [phone] Dept Fax: [phone]   No orders of the defined types were placed in this encounter.     CHIEF COMPLAINT:  CC: A 51 year old with metastatic, recurrent breast cancer here for nadir visit after cycle 1  Current Treatment:  Trastuzumab, Pertuzumab, Docetaxel  INTERVAL HISTORY:  Carmin is here today for repeat clinical assessment. She denies fevers or chills. She denies pain. Her appetite is good. Her weight is stable.  I have reviewed the past medical history, past surgical history, social history and family history with the patient and they are unchanged from previous note.  ALLERGIES:  has No Known Allergies.  MEDICATIONS:  Current Outpatient Medications  Medication Sig Dispense Refill   fluconazole (DIFLUCAN) 150 MG tablet Take 1 tablet (150 mg total) by mouth daily. 2 tablet 3   LORazepam (ATIVAN) 1 MG tablet Take 1 tablet (1 mg total) by mouth every 8 (eight) hours. 30 tablet 0   morphine (MS CONTIN) 15 MG 12 hr tablet TAKE ONE TABLET BY MOUTH EVERY 12 HOURS 60 tablet 0   mupirocin nasal ointment (BACTROBAN NASAL) 2 % Place 1 application into the nose 2 (two) times daily. Use one-half of tube in each nostril twice daily for five (5) days. After application,  press sides of nose together and gently massage. 10 g 0   nystatin (MYCOSTATIN) 100000 UNIT/ML suspension Take 5 mLs (500,000 Units total) by mouth 4 (four) times daily. 473 mL 0   potassium chloride SA (KLOR-CON M) 20 MEQ tablet Take 1 tablet (20 mEq total) by mouth  2 (two) times daily. 60 tablet 0   albuterol (VENTOLIN HFA) 108 (90 Base) MCG/ACT inhaler Inhale 2 puffs into the lungs every 6 (six) hours as needed for shortness of breath.     ARIPiprazole (ABILIFY) 5 MG tablet Take 2.5 mg by mouth daily.     aspirin 81 MG EC tablet Take 81 mg by mouth daily. Swallow whole.     atorvastatin (LIPITOR) 20 MG tablet Take 1 tablet (20 mg total) by mouth daily. 90 tablet 3   celecoxib (CELEBREX) 200 MG capsule Take 1 capsule (200 mg total) by mouth 2 (two) times daily. To start this after finishing Xarelto 60 capsule 0   desvenlafaxine (PRISTIQ) 50 MG 24 hr tablet Take 50 mg by mouth daily.     dexamethasone (DECADRON) 4 MG tablet Take 2 tabs twice daily beginning the day before chemotherapy and for 2 days after, every 3 weeks, by mouth 60 tablet 0   FARXIGA 10 MG TABS tablet Take 1 tablet (10 mg total) by mouth daily. 90 tablet 3   fluticasone (FLONASE) 50 MCG/ACT nasal spray Place 1 spray into both nostrils daily.     labetalol (NORMODYNE) 200 MG tablet Take 1 tablet (200 mg total) by mouth 2 (two) times daily. 180 tablet 3   Loratadine 10 MG CAPS Take 1 capsule (10 mg total) by mouth daily. 30 capsule 6   magic mouthwash (lidocaine, diphenhydrAMINE, alum & mag hydroxide) suspension Take 5 mLs by mouth every 3 (three) hours as needed. Swish and spit; for throat/mouth pain     methocarbamol (ROBAXIN) 500 MG tablet Take 1 tablet (500 mg total) by mouth every 6 (six) hours as needed for muscle spasms. 40 tablet 0   nitroGLYCERIN (NITROSTAT) 0.4 MG SL tablet Place 0.4 mg under the tongue every 5 (five) minutes as needed for chest pain.     omeprazole (PRILOSEC) 40 MG capsule Take 1 capsule (40 mg total) by mouth daily. 30 capsule 5   ondansetron (ZOFRAN) 4 MG tablet Take 1 tablet (4 mg total) by mouth every 4 (four) hours as needed for nausea. 90 tablet 3   Oxycodone HCl 10 MG TABS Take 1 tablet (10 mg total) by mouth every 4 (four) hours as needed. 120 tablet 0    OZEMPIC, 0.25 OR 0.5 MG/DOSE, 2 MG/1.5ML SOPN Inject into the skin.     prochlorperazine (COMPAZINE) 10 MG tablet Take 1 tablet (10 mg total) by mouth every 6 (six) hours as needed for nausea or vomiting. 90 tablet 3   senna (SENOKOT) 8.6 MG TABS tablet Take 2 tablets by mouth 2 (two) times daily.     topiramate (TOPAMAX) 50 MG tablet Take 50 mg by mouth daily.     valsartan (DIOVAN) 40 MG tablet Take 1 tablet (40 mg total) by mouth daily. 90 tablet 3   Vitamin D, Ergocalciferol, (DRISDOL) 1.25 MG (50000 UNIT) CAPS capsule Take 50,000 Units by mouth once a week.     Current Facility-Administered Medications  Medication Dose Route Frequency Provider Last Rate Last Admin   0.9 %  sodium chloride infusion  500 mL Intravenous Once Jackquline Denmark, MD       technetium sestamibi generic (  CARDIOLITE) injection 26.3 millicurie  78.5 millicurie Intravenous Once PRN Revankar, Reita Cliche, MD        HISTORY OF PRESENT ILLNESS:   Oncology History  Malignant neoplasm of upper-outer quadrant of left female breast ([place])  07/23/2013 Initial Diagnosis   Malignant neoplasm of upper-outer quadrant of left female breast ([place])   07/23/2013 Cancer Staging   Staging form: Breast, AJCC 7th Edition - Clinical stage from 07/23/2013: Stage IIB (T2, N1, M0) - Signed by Derwood Kaplan, MD on 07/04/2020 Prognostic indicators: Pos LVI    09/22/2021 -  Chemotherapy   Patient is on Treatment Plan : BREAST DOCEtaxel + Trastuzumab + Pertuzumab (THP) q21d x 8 cycles / Trastuzumab + Pertuzumab q21d x 4 cycles     Bone metastases ([place])  06/28/2021 Initial Diagnosis   Bone metastases ([place])   09/22/2021 -  Chemotherapy   Patient is on Treatment Plan : BREAST DOCEtaxel + Trastuzumab + Pertuzumab (THP) q21d x 8 cycles / Trastuzumab + Pertuzumab q21d x 4 cycles         REVIEW OF SYSTEMS:   Constitutional: Denies fevers, chills or abnormal weight loss Eyes: Denies blurriness of vision Ears, nose, mouth, throat, and  face: Denies mucositis or sore throat Respiratory: Denies cough, dyspnea or wheezes Cardiovascular: Denies palpitation, chest discomfort or lower extremity swelling Gastrointestinal:  Denies nausea, heartburn or change in bowel habits Skin: Denies abnormal skin rashes Lymphatics: Denies new lymphadenopathy or easy bruising Neurological:Denies numbness, tingling or new weaknesses Behavioral/Psych: Mood is stable, no new changes  All other systems were reviewed with the patient and are negative.   VITALS:  Blood pressure 109/62, pulse (!) 122, temperature 99.3 F (37.4 C), temperature source Oral, resp. rate 18, height 5' 2"  (1.575 m), weight 164 lb 14.4 oz (74.8 kg), last menstrual period 08/18/2013, SpO2 99 %.  Wt Readings from Last 3 Encounters:  10/02/21 164 lb 14.4 oz (74.8 kg)  09/25/21 167 lb 12 oz (76.1 kg)  09/22/21 168 lb (76.2 kg)    Body mass index is 30.16 kg/m.  Performance status (ECOG): 1 - Symptomatic but completely ambulatory  PHYSICAL EXAM:   GENERAL:alert, no distress and comfortable SKIN: skin color, texture, turgor are normal, no rashes or significant lesions EYES: normal, Conjunctiva are pink and non-injected, sclera clear OROPHARYNX:no exudate, no erythema and lips, buccal mucosa, and tongue normal  NECK: supple, thyroid normal size, non-tender, without nodularity LYMPH:  no palpable lymphadenopathy in the cervical, axillary or inguinal LUNGS: clear to auscultation and percussion with normal breathing effort HEART: regular rate & rhythm and no murmurs and no lower extremity edema ABDOMEN:abdomen soft, non-tender and normal bowel sounds Musculoskeletal:no cyanosis of digits and no clubbing  NEURO: alert & oriented x 3 with fluent speech, no focal motor/sensory deficits  LABORATORY DATA:  I have reviewed the data as listed    Component Value Date/Time   NA 140 10/02/2021 0000   K 3.2 (A) 10/02/2021 0000   CL 106 10/02/2021 0000   CO2 27 (A) 10/02/2021  0000   GLUCOSE 147 (H) 07/05/2021 0330   BUN 9 10/02/2021 0000   CREATININE 0.6 10/02/2021 0000   CREATININE 0.71 07/05/2021 0330   CALCIUM 8.4 (A) 10/02/2021 0000   PROT 7.7 06/30/2021 0833   ALBUMIN 3.7 10/02/2021 0000   AST 27 10/02/2021 0000   ALT 29 10/02/2021 0000   ALKPHOS 241 (A) 10/02/2021 0000   BILITOT 0.7 06/30/2021 0833   GFRNONAA >60 07/05/2021 0330    No results  found for: SPEP, UPEP  Lab Results  Component Value Date   WBC 15.2 10/02/2021   NEUTROABS 14.59 10/02/2021   HGB 11.2 (A) 10/02/2021   HCT 35 (A) 10/02/2021   MCV 79 (A) 09/06/2021   PLT 213 10/02/2021      Chemistry      Component Value Date/Time   NA 140 10/02/2021 0000   K 3.2 (A) 10/02/2021 0000   CL 106 10/02/2021 0000   CO2 27 (A) 10/02/2021 0000   BUN 9 10/02/2021 0000   CREATININE 0.6 10/02/2021 0000   CREATININE 0.71 07/05/2021 0330   GLU 122 10/02/2021 0000      Component Value Date/Time   CALCIUM 8.4 (A) 10/02/2021 0000   ALKPHOS 241 (A) 10/02/2021 0000   AST 27 10/02/2021 0000   ALT 29 10/02/2021 0000   BILITOT 0.7 06/30/2021 1771       RADIOGRAPHIC STUDIES: I have personally reviewed the radiological images as listed and agreed with the findings in the report. No results found.

## 2021-10-02 NOTE — Assessment & Plan Note (Addendum)
New bone metastases, November 2022, with multiple marrow replacing lesions within the proximal right femur within the ischium as well as the inferior rami. 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 Ki67 was 60%. PET imaging from December 19th revealed dominant finding of intensely hypermetabolic aggressive skeletal metastasis involving the calvarium, sternum, thoracic and lumbar spine, and pelvis, with potential pathologic fracture of the right femur with internal fixation. There is soft tissue extension into the anterior mediastinum associated with the manubrial metastasis. Multiple spinal vertebral body lesions have cortical destruction along the posterior margin. She started monthly Xgeva in December, and recently completed radiation therapy. We will plan for THP and hormonal therapy. She tolerated cycle 1 fairly well. She continues to have mouth sores and notes sores inside her nose. She has had ongoing diarrhea with some nausea and anxiety. I will send in ativan for both nausea and anxiety. I will also send in [place] for her nose. She will return to clinic on 03/01 prior to cycle 2.

## 2021-10-02 NOTE — Assessment & Plan Note (Signed)
This has healed completely.

## 2021-10-02 NOTE — Assessment & Plan Note (Signed)
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 She was on adjuvant hormonal therapy with tamoxifen 20 mg daily from October 2015 to May 2020, but due to elevation of the liver transaminases was switched to anastrazole.

## 2021-10-03 ENCOUNTER — Encounter: Payer: Self-pay | Admitting: Oncology

## 2021-10-04 ENCOUNTER — Telehealth: Payer: Self-pay | Admitting: Dietician

## 2021-10-04 NOTE — Telephone Encounter (Signed)
Patient screened on MST. First attempt to reach. Provided my cell# on voice mail to return call to set up a nutrition consult.  April Manson, RDN, LDN Registered Dietitian, [place] Part Time Remote (Usual office hours: Tuesday-Thursday) Cell: [phone]

## 2021-10-05 NOTE — Progress Notes (Signed)
[facility]  [address] [place],  [place]  [address] [phone]  Clinic Day:  10/11/2021  Referring physician: Bonnita Nasuti, MD  This document serves as a record of services personally performed by Hosie Poisson, MD. It was created on their behalf by Curry,Lauren E, a trained medical scribe. The creation of this record is based on the scribe's personal observations and the provider's statements to them.  ASSESSMENT & PLAN:   1. 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 She was on adjuvant hormonal therapy with tamoxifen 20 mg daily from October 2015 to May 2020, but due to elevation of the liver transaminases was switched to anastrazole.    2. Severe hepatic steatosis with elevation of the liver transaminases, nearly normalized now.  She continues to follow with hepatology, Dr. Beverley Fiedler every 6 months who has recommended annual ultrasounds of the liver.   3. Borderline osteopenia of the spine.  She is on high-dose vitamin-D, and I advised her to take calcium daily.    4. Exophytic cyst of the right kidney, present for many years which had slightly enlarged.   MRI confirmed this to be a benign cyst.    5. New bone metastases, November 2022, with multiple marrow replacing lesions within the proximal right femur within the ischium as well as the inferior rami. 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 Ki67 was 60%. PET imaging from December 19th revealed dominant finding of intensely hypermetabolic aggressive skeletal metastasis involving the calvarium, sternum, thoracic and lumbar spine, and pelvis, with potential pathologic fracture of the right femur with internal fixation. There is soft tissue extension into the anterior mediastinum associated with the  manubrial metastasis. Multiple spinal vertebral body lesions have cortical destruction along the posterior margin. She started monthly Xgeva in December, and recently completed radiation therapy. She is now receiving TCHP and will plan for hormonal therapy.   6. Lymphedema of the left upper extremity with numbness of the first two fingers of the left hand. I gave her a booklet with information and recommendations, and we will refer her to Amy the lymphedema specialist as well. I can feel a hard lymph node in the left supraclavicular fossa and so this may be contributing to the edema.    7. Probable carpal tunnel syndrome on the left. This may be largely induced by her lymphedema but the symptoms are fairly severe. I will refer her to occupational therapy.  8.  Bilateral lower extremity edema with areas of erythema and blisters. This is suspicious of cellulitis, and so I will place her on antibiotics with Keflex 500 mg TID for 10 days.  9.  Microcytic anemia, worsening. We will order iron studies, B12 and folate for further evaluation.  10.  Hypocalcemia, worsening. I advised that she increase oral calcium to 6 pills daily.  She will proceed with a 2nd cycle of THP on Friday. The [place] See will be held in view of her hypocalcemia. She knows to increase oral calcium 600 mg to 6 pills daily. As above, I will order iron studies, B12 and folate for further evaluation of her anemia. In view of her skin symptoms, I will place her on a course of antibiotics with Keflex 500 mg TID for 10 days. We will see her back on March 9th for repeat evaluation.  We will plan to repeat ECHO in early May. The patient and her husband understand the plans discussed today and are in agreement with them.  She knows to contact our office if she develops concerns prior to her next appointment.  I provided 20 minutes of face-to-face time during this this encounter and > 50% was spent counseling as documented under my assessment and  plan.    Derwood Kaplan, MD [place] [address] Dept: [phone] Dept Fax: [phone]    CHIEF COMPLAINT:  CC: Metastatic breast cancer  Current Treatment:  THP for 8 cycles   HISTORY OF PRESENT ILLNESS:  Anna [name] is a 51 y.o. female originally diagnosed with stage IIB (T2 N1a M0) hormone receptor positive left breast cancer in December 2014.  She was treated with lumpectomy.  Pathology revealed a 2.1 cm, grade 3, invasive ductal carcinoma with 1 lymph node positive for metastasis.  Lymphovascular invasion was seen.  Estrogen and progesterone receptors were positive and her 2 Neu negative.  Ki 67 was 76%.  She received adjuvant chemotherapy with 4 cycles of doxorubicin and cyclophosphamide, followed by 12 weeks of weekly paclitaxel.  She then received adjuvant radiation to the left breast.  She was placed on tamoxifen 20 mg daily in October 2015.  She underwent testing for hereditary cancer syndromes with My[employer] myRisk Hereditary Cancer panel, which did not reveal any clinically significant mutation or variants of uncertain significance, so no change in management was recommended.  She was found to have fatty infiltration of the liver on a CTA chest in June 2015.  Bone density scan in November 2017 was normal.  Bilateral diagnostic mammogram in November 2018 did not reveal any evidence of malignancy.   When she was seen in May 2019, she had mild elevation of the AST, which was felt to likely be due to medication effects. She had a bilateral diagnostic mammogram in January 2020.  At that time, she reported a lump in the left breast, so an ultrasound was also done.  Bilateral mammogram and left breast ultrasound did not reveal any suspicious findings.  Postsurgical scarring was seen in the left breast.  The area of palpable abnormality in the left breast was overlying the postsurgical  scarring.  She was rescheduled in our office in May 2020 and found to have significant increase in her liver transaminases.  Tamoxifen was discontinued.  She was instructed to avoid alcohol and Tylenol.  CT abdomen and pelvis revealed severe hepatic steatosis.  Bone density scan in June 2020 revealed mild osteopenia with a T-score -1.4 in the spine.  Her last menstruation was in January 2015 and labs confirmed her postmenopausal status.  She was placed on anastrozole 1 mg daily in June 2020.  She was also referred to hepatology and saw Roosevelt Locks, NP, who diagnosed the patient with nonalcoholic fatty liver disease.  She was unsure if this was secondary to tamoxifen, but felt it was unlikely.  Liver biopsy in July 2020 confirmed severe steatosis of 75-80% with moderate ballooning degeneration.  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  The patient has had improvement in the liver transaminases. Abdominal ultrasound per Dr. Beverley Fiedler which revealed an enlarging exophytic cyst of the right kidney.  This has been appreciated for many years, on PET imaging from 2014 this measured 10.6 cm, CT in May 2020  this was 12.4 cm, and recent abdominal ultrasound from July measured this at 14.5 cm. MRI done in November confirmed a benign cyst of the right kidney.  She continues to see Dr. Beverley Fiedler every 6 months.  She has also been seen by dermatology at [facility], with a good report.    She began to have pain and functional disability several months ago with rapid worsening. MRI of the right hip from November 14th revealed multiple marrow replacing lesions within the proximal femur within the ischium as well as the inferior rami. These findings were most consistent with metastatic disease and impending fracture. The primary and largest focus nearly fills the entire medullary space at the femoral neck with endosteal thinning and irregularity, most severely over the anterior  cortex. She underwent right hip arthroplasty on November 22nd with Dr. Paralee Cancel. Surgical pathology confirmed metastatic carcinoma, consistent with patient's clinical history of primary breast carcinoma. She had severe weight loss. She is using oxycodone 5 mg for her pain.  Prognostic profile confirmed estrogen receptor was positive at 95%, and progesterone receptor was positive at 40%. HER2 was positive 3+, which was negative on her original cancer. PET imaging from December 19th revealed dominant finding of intensely hypermetabolic aggressive skeletal metastasis involving the calvarium, sternum, thoracic and lumbar spine, and pelvis. There is potential pathologic fracture of the right femur with internal fixation, and soft tissue extension into the anterior mediastinum associated with the manubrial metastasis. Multiple spinal vertebral body lesions have cortical destruction along the posterior margin. Baseline ECHO which looks good with an EF of 55-60%. Bone density from December revealed mild osteopenia of the AP spine with a T-score of -1.2.  INTERVAL HISTORY:  I have reviewed her chart and materials related to her cancer extensively and collaborated history with the patient. Summary of oncologic history is as follows: Oncology History  Malignant neoplasm of upper-outer quadrant of left female breast ([place])  07/23/2013 Initial Diagnosis   Malignant neoplasm of upper-outer quadrant of left female breast ([place])   07/23/2013 Cancer Staging   Staging form: Breast, AJCC 7th Edition - Clinical stage from 07/23/2013: Stage IIB (T2, N1, M0) - Signed by Derwood Kaplan, MD on 07/04/2020 Prognostic indicators: Pos LVI    09/22/2021 -  Chemotherapy   Patient is on Treatment Plan : BREAST DOCEtaxel + Trastuzumab + Pertuzumab (THP) q21d x 8 cycles / Trastuzumab + Pertuzumab q21d x 4 cycles     Bone metastases ([place])  06/28/2021 Initial Diagnosis   Bone metastases ([facility])   09/22/2021 -  Chemotherapy    Patient is on Treatment Plan : BREAST DOCEtaxel + Trastuzumab + Pertuzumab (THP) q21d x 8 cycles / Trastuzumab + Pertuzumab q21d x 4 cycles       Cyniah is here for routine follow up prior to her 2nd cycle of THP. She received her 1st cycle on February 10th and tolerated with difficulty including severe sore throat, trouble swallowing and taste changes consistent with thrush as well as hoarseness and diarrhea. She was given diflucan and nystatin as well as Magic Mouthwash. She reports lower extremity edema as well as patches of erythema and blisters which started about 2 days ago. She notes back pain which she rates as a 5/10 today. She continues oral calcium, 4 daily. Hemoglobin has decreased from 11.2 to 10.7, platelets have increased from 213,000 to 454,000 and white count is normal. Chemistries are unremarkable except for a calcium of 7.9, 8.3 corrected. I advised she increase oral calcium supplement to  6 daily.  Her  appetite is good, and she has gained 5 pounds since her last visit.  She denies fever, chills or other signs of infection.  She denies nausea, vomiting, bowel issues, or abdominal pain.  She denies sore throat, cough, dyspnea, or chest pain.  HISTORY:   Allergies: No Known Allergies  Current Medications: Current Outpatient Medications  Medication Sig Dispense Refill   morphine (MS CONTIN) 15 MG 12 hr tablet TAKE ONE TABLET BY MOUTH EVERY 12 HOURS 60 tablet 0   albuterol (VENTOLIN HFA) 108 (90 Base) MCG/ACT inhaler Inhale 2 puffs into the lungs every 6 (six) hours as needed for shortness of breath.     ARIPiprazole (ABILIFY) 5 MG tablet Take 2.5 mg by mouth daily.     aspirin 81 MG EC tablet Take 81 mg by mouth daily. Swallow whole.     atorvastatin (LIPITOR) 20 MG tablet Take 1 tablet (20 mg total) by mouth daily. 90 tablet 3   celecoxib (CELEBREX) 200 MG capsule Take 1 capsule (200 mg total) by mouth 2 (two) times daily. To start this after finishing Xarelto 60 capsule 0    cephALEXin (KEFLEX) 500 MG capsule Take 1 capsule (500 mg total) by mouth 3 (three) times daily. 30 capsule 0   desvenlafaxine (PRISTIQ) 50 MG 24 hr tablet Take 50 mg by mouth daily.     dexamethasone (DECADRON) 4 MG tablet Take 2 tabs twice daily beginning the day before chemotherapy and for 2 days after, every 3 weeks, by mouth 60 tablet 0   FARXIGA 10 MG TABS tablet Take 1 tablet (10 mg total) by mouth daily. 90 tablet 3   fluconazole (DIFLUCAN) 150 MG tablet Take 1 tablet (150 mg total) by mouth daily. 2 tablet 3   fluticasone (FLONASE) 50 MCG/ACT nasal spray Place 1 spray into both nostrils daily.     labetalol (NORMODYNE) 200 MG tablet Take 1 tablet (200 mg total) by mouth 2 (two) times daily. 180 tablet 3   Loratadine 10 MG CAPS Take 1 capsule (10 mg total) by mouth daily. 30 capsule 6   LORazepam (ATIVAN) 1 MG tablet Take 1 tablet (1 mg total) by mouth every 8 (eight) hours. 30 tablet 0   magic mouthwash (lidocaine, diphenhydrAMINE, alum & mag hydroxide) suspension Take 5 mLs by mouth every 3 (three) hours as needed. Swish and spit; for throat/mouth pain     mupirocin nasal ointment (BACTROBAN NASAL) 2 % Place 1 application into the nose 2 (two) times daily. Use one-half of tube in each nostril twice daily for five (5) days. After application, press sides of nose together and gently massage. 10 g 0   nitroGLYCERIN (NITROSTAT) 0.4 MG SL tablet Place 0.4 mg under the tongue every 5 (five) minutes as needed for chest pain.     nystatin (MYCOSTATIN) 100000 UNIT/ML suspension Take 5 mLs (500,000 Units total) by mouth 4 (four) times daily. 473 mL 0   omeprazole (PRILOSEC) 40 MG capsule Take 1 capsule (40 mg total) by mouth daily. 30 capsule 5   ondansetron (ZOFRAN) 4 MG tablet Take 1 tablet (4 mg total) by mouth every 4 (four) hours as needed for nausea. 90 tablet 3   Oxycodone HCl 10 MG TABS Take 1 tablet (10 mg total) by mouth every 4 (four) hours as needed. 120 tablet 0   OZEMPIC, 0.25 OR 0.5  MG/DOSE, 2 MG/1.5ML SOPN Inject into the skin.     potassium chloride SA (KLOR-CON M) 20 MEQ tablet  Take 1 tablet (20 mEq total) by mouth 2 (two) times daily. 60 tablet 0   prochlorperazine (COMPAZINE) 10 MG tablet Take 1 tablet (10 mg total) by mouth every 6 (six) hours as needed for nausea or vomiting. 90 tablet 3   topiramate (TOPAMAX) 50 MG tablet Take 50 mg by mouth daily.     valsartan (DIOVAN) 40 MG tablet Take 1 tablet (40 mg total) by mouth daily. 90 tablet 3   Vitamin D, Ergocalciferol, (DRISDOL) 1.25 MG (50000 UNIT) CAPS capsule Take 50,000 Units by mouth once a week.     Current Facility-Administered Medications  Medication Dose Route Frequency Provider Last Rate Last Admin   0.9 %  sodium chloride infusion  500 mL Intravenous Once Jackquline Denmark, MD       technetium sestamibi generic (CARDIOLITE) injection 19.3 millicurie  79.0 millicurie Intravenous Once PRN Revankar, Reita Cliche, MD        REVIEW OF SYSTEMS:  Review of Systems  Constitutional: Negative.  Negative for appetite change, chills, fatigue, fever and unexpected weight change.  HENT:  Negative.    Eyes: Negative.   Respiratory: Negative.  Negative for chest tightness, cough, hemoptysis, shortness of breath and wheezing.   Cardiovascular:  Positive for leg swelling (bilateral with associated areas of erythema and blisters). Negative for chest pain and palpitations.  Gastrointestinal: Negative.  Negative for abdominal distention, abdominal pain, blood in stool, constipation, diarrhea, nausea and vomiting.  Endocrine: Negative.   Genitourinary: Negative.  Negative for difficulty urinating, dysuria, frequency and hematuria.   Musculoskeletal:  Positive for back pain (moderate) and gait problem (using a walker to ambulate). Negative for arthralgias, flank pain and myalgias.  Skin: Negative.   Neurological:  Positive for gait problem (using a walker to ambulate). Negative for dizziness, extremity weakness, headaches,  light-headedness, numbness, seizures and speech difficulty.  Hematological: Negative.   Psychiatric/Behavioral: Negative.  Negative for depression and sleep disturbance. The patient is not nervous/anxious.      VITALS:  Blood pressure 139/63, pulse 91, temperature 98.3 F (36.8 C), temperature source Oral, resp. rate 18, height 5' 2" (1.575 m), weight 169 lb 4.8 oz (76.8 kg), last menstrual period 08/18/2013, SpO2 94 %.  Wt Readings from Last 3 Encounters:  10/11/21 169 lb 4.8 oz (76.8 kg)  10/02/21 164 lb 14.4 oz (74.8 kg)  09/25/21 167 lb 12 oz (76.1 kg)    Body mass index is 30.97 kg/m.  Performance status (ECOG): 1 - Symptomatic but completely ambulatory  PHYSICAL EXAM:  Physical Exam Constitutional:      General: She is not in acute distress.    Appearance: Normal appearance. She is normal weight.  HENT:     Head: Normocephalic and atraumatic.  Eyes:     General: No scleral icterus.    Extraocular Movements: Extraocular movements intact.     Conjunctiva/sclera: Conjunctivae normal.     Pupils: Pupils are equal, round, and reactive to light.  Cardiovascular:     Rate and Rhythm: Normal rate and regular rhythm.     Pulses: Normal pulses.     Heart sounds: Normal heart sounds. No murmur heard.   No friction rub. No gallop.  Pulmonary:     Effort: Pulmonary effort is normal. No respiratory distress.     Breath sounds: Normal breath sounds.  Abdominal:     General: Bowel sounds are normal. There is no distension.     Palpations: Abdomen is soft. There is no hepatomegaly, splenomegaly or mass.  Tenderness: There is no abdominal tenderness.  Musculoskeletal:        General: Normal range of motion.     Cervical back: Normal range of motion and neck supple.     Right lower leg: No edema.     Left lower leg: No edema.     Comments: She has some erythema of the anterior bilateral tibia which is warm to touch, and mildly tender. She has some vesicular lesions scattered of  both legs.  Lymphadenopathy:     Cervical: No cervical adenopathy.  Skin:    General: Skin is warm and dry.  Neurological:     General: No focal deficit present.     Mental Status: She is alert and oriented to person, place, and time. Mental status is at baseline.  Psychiatric:        Mood and Affect: Mood normal.        Behavior: Behavior normal.        Thought Content: Thought content normal.        Judgment: Judgment normal.     LABS:   CBC Latest Ref Rng & Units 10/11/2021 10/02/2021 09/06/2021  WBC - 6.9 15.2 5.1  Hemoglobin 12.0 - 16.0 10.7(A) 11.2(A) 12.3  Hematocrit 36 - 46 34(A) 35(A) 39  Platelets 150 - 399 454(A) 213 307   CMP Latest Ref Rng & Units 10/11/2021 10/02/2021 09/06/2021  Glucose 70 - 99 mg/dL - - -  BUN 4 - _0 Creatinine 0.5 - 1.1 0.6 0.6 0.6  Sodium 137 - 147 142 140 139  Potassium 3.4 - 5.3 3.6 3.2(A) 4.1  Chloride 99 - 108 111(A) 106 110(A)  CO2 13 - 22 24(A) 27(A) 21  Calcium 8.7 - 10.7 8.3(A) 8.4(A) 8.8  Total Protein 6.5 - 8.1 g/dL - - -  Total Bilirubin 0.3 - 1.2 mg/dL - - -  Alkaline Phos 25 - 125 169(A) 241(A) 176(A)  AST 13 - 35 _1 ALT 7 - 35 _2 Lab Results  Component Value Date   CEA1 2.2 07/20/2021   /  CEA  Date Value Ref Range Status  07/20/2021 2.2 0.0 - 4.7 ng/mL Final    Comment:    (NOTE)                             Nonsmokers          <3.9                             Smokers             <5.6 [employer] Electrochemiluminescence Immunoassay (ECLIA) Values obtained with different assay methods or kits cannot be used interchangeably.  Results cannot be interpreted as absolute evidence of the presence or absence of malignant disease. Performed At: [facility] [place], [place] [id number] Rush Farmer MD DJ:[id number]      STUDIES:  No results found.    I, Rita Ohara, am acting as scribe for Derwood Kaplan, MD  I have reviewed this report as typed by the  medical scribe, and it is complete and accurate.

## 2021-10-06 ENCOUNTER — Other Ambulatory Visit: Payer: Self-pay | Admitting: Pharmacist

## 2021-10-06 ENCOUNTER — Ambulatory Visit: Payer: BC Managed Care – PPO

## 2021-10-11 ENCOUNTER — Encounter: Payer: Self-pay | Admitting: Oncology

## 2021-10-11 ENCOUNTER — Other Ambulatory Visit: Payer: Self-pay | Admitting: Oncology

## 2021-10-11 ENCOUNTER — Other Ambulatory Visit: Payer: Self-pay

## 2021-10-11 ENCOUNTER — Inpatient Hospital Stay: Payer: BC Managed Care – PPO

## 2021-10-11 ENCOUNTER — Inpatient Hospital Stay: Payer: BC Managed Care – PPO | Attending: Oncology | Admitting: Oncology

## 2021-10-11 VITALS — BP 139/63 | HR 91 | Temp 98.3°F | Resp 18 | Ht 62.0 in | Wt 169.3 lb

## 2021-10-11 DIAGNOSIS — D539 Nutritional anemia, unspecified: Secondary | ICD-10-CM

## 2021-10-11 DIAGNOSIS — D509 Iron deficiency anemia, unspecified: Secondary | ICD-10-CM | POA: Insufficient documentation

## 2021-10-11 DIAGNOSIS — C50412 Malignant neoplasm of upper-outer quadrant of left female breast: Secondary | ICD-10-CM | POA: Diagnosis not present

## 2021-10-11 DIAGNOSIS — C7951 Secondary malignant neoplasm of bone: Secondary | ICD-10-CM

## 2021-10-11 DIAGNOSIS — K760 Fatty (change of) liver, not elsewhere classified: Secondary | ICD-10-CM | POA: Insufficient documentation

## 2021-10-11 DIAGNOSIS — N281 Cyst of kidney, acquired: Secondary | ICD-10-CM | POA: Insufficient documentation

## 2021-10-11 DIAGNOSIS — Z170 Estrogen receptor positive status [ER+]: Secondary | ICD-10-CM

## 2021-10-11 DIAGNOSIS — I890 Lymphedema, not elsewhere classified: Secondary | ICD-10-CM | POA: Diagnosis not present

## 2021-10-11 DIAGNOSIS — Z5111 Encounter for antineoplastic chemotherapy: Secondary | ICD-10-CM | POA: Insufficient documentation

## 2021-10-11 DIAGNOSIS — Z5112 Encounter for antineoplastic immunotherapy: Secondary | ICD-10-CM | POA: Insufficient documentation

## 2021-10-11 DIAGNOSIS — Z5189 Encounter for other specified aftercare: Secondary | ICD-10-CM | POA: Insufficient documentation

## 2021-10-11 DIAGNOSIS — R600 Localized edema: Secondary | ICD-10-CM | POA: Insufficient documentation

## 2021-10-11 DIAGNOSIS — L03119 Cellulitis of unspecified part of limb: Secondary | ICD-10-CM

## 2021-10-11 LAB — HEPATIC FUNCTION PANEL
ALT: 21 (ref 7–35)
AST: 26 (ref 13–35)
Alkaline Phosphatase: 169 — AB (ref 25–125)
Bilirubin, Total: 0.3

## 2021-10-11 LAB — BASIC METABOLIC PANEL
BUN: 11 (ref 4–21)
CO2: 24 — AB (ref 13–22)
Chloride: 111 — AB (ref 99–108)
Creatinine: 0.6 (ref 0.5–1.1)
Glucose: 125
Potassium: 3.6 (ref 3.4–5.3)
Sodium: 142 (ref 137–147)

## 2021-10-11 LAB — MAGNESIUM: Magnesium: 2

## 2021-10-11 LAB — CBC AND DIFFERENTIAL
HCT: 34 — AB (ref 36–46)
Hemoglobin: 10.7 — AB (ref 12.0–16.0)
Neutrophils Absolute: 5.66
Platelets: 454 — AB (ref 150–399)
WBC: 6.9

## 2021-10-11 LAB — COMPREHENSIVE METABOLIC PANEL
Albumin: 3.6 (ref 3.5–5.0)
Calcium: 8.3 — AB (ref 8.7–10.7)

## 2021-10-11 LAB — CBC: RBC: 4.42 (ref 3.87–5.11)

## 2021-10-11 MED ORDER — CEPHALEXIN 500 MG PO CAPS: 500.0000 mg | ORAL_CAPSULE | Freq: Three times a day (TID) | ORAL | 0 refills | Status: DC

## 2021-10-11 NOTE — Progress Notes (Signed)
Patient does have bilateral lower leg swelling with some redness and blister like areas. ?

## 2021-10-11 NOTE — Progress Notes (Signed)
Enrolled patient into co-pay assistance for [place]anne [place], and [place].  ?

## 2021-10-12 ENCOUNTER — Encounter: Payer: Self-pay | Admitting: Oncology

## 2021-10-12 MED FILL — Trastuzumab-dkst For IV Soln 150 MG: INTRAVENOUS | Qty: 22 | Status: AC

## 2021-10-12 MED FILL — Docetaxel Soln for IV Infusion 160 MG/16ML: INTRAVENOUS | Qty: 14 | Status: AC

## 2021-10-12 MED FILL — Pertuzumab Soln for IV Infusion 420 MG/14ML (30 MG/ML): INTRAVENOUS | Qty: 14 | Status: AC

## 2021-10-13 ENCOUNTER — Other Ambulatory Visit: Payer: Self-pay | Admitting: Oncology

## 2021-10-13 ENCOUNTER — Other Ambulatory Visit: Payer: Self-pay

## 2021-10-13 ENCOUNTER — Inpatient Hospital Stay: Payer: BC Managed Care – PPO

## 2021-10-13 VITALS — BP 145/67 | HR 120 | Temp 98.1°F | Resp 18 | Ht 62.0 in | Wt 170.0 lb

## 2021-10-13 DIAGNOSIS — D539 Nutritional anemia, unspecified: Secondary | ICD-10-CM

## 2021-10-13 DIAGNOSIS — D509 Iron deficiency anemia, unspecified: Secondary | ICD-10-CM | POA: Diagnosis not present

## 2021-10-13 DIAGNOSIS — Z5112 Encounter for antineoplastic immunotherapy: Secondary | ICD-10-CM | POA: Diagnosis present

## 2021-10-13 DIAGNOSIS — C7951 Secondary malignant neoplasm of bone: Secondary | ICD-10-CM | POA: Diagnosis not present

## 2021-10-13 DIAGNOSIS — K760 Fatty (change of) liver, not elsewhere classified: Secondary | ICD-10-CM | POA: Diagnosis not present

## 2021-10-13 DIAGNOSIS — C50412 Malignant neoplasm of upper-outer quadrant of left female breast: Secondary | ICD-10-CM

## 2021-10-13 DIAGNOSIS — Z170 Estrogen receptor positive status [ER+]: Secondary | ICD-10-CM

## 2021-10-13 DIAGNOSIS — Z5111 Encounter for antineoplastic chemotherapy: Secondary | ICD-10-CM | POA: Diagnosis not present

## 2021-10-13 DIAGNOSIS — R600 Localized edema: Secondary | ICD-10-CM | POA: Diagnosis not present

## 2021-10-13 DIAGNOSIS — N281 Cyst of kidney, acquired: Secondary | ICD-10-CM | POA: Diagnosis not present

## 2021-10-13 DIAGNOSIS — Z5189 Encounter for other specified aftercare: Secondary | ICD-10-CM | POA: Diagnosis not present

## 2021-10-13 DIAGNOSIS — I890 Lymphedema, not elsewhere classified: Secondary | ICD-10-CM | POA: Diagnosis not present

## 2021-10-13 LAB — VITAMIN B12: Vitamin B-12: 3260 pg/mL — ABNORMAL HIGH (ref 180–914)

## 2021-10-13 LAB — IRON AND TIBC
Iron: 37 ug/dL (ref 28–170)
Saturation Ratios: 13 % (ref 10.4–31.8)
TIBC: 279 ug/dL (ref 250–450)
UIBC: 242 ug/dL

## 2021-10-13 LAB — FOLATE: Folate: 7.7 ng/mL (ref >5.9)

## 2021-10-13 LAB — FERRITIN: Ferritin: 206 ng/mL (ref 11–307)

## 2021-10-13 MED ORDER — DIPHENHYDRAMINE HCL 25 MG PO CAPS
50.0000 mg | ORAL_CAPSULE | Freq: Once | ORAL | Status: AC
  Administered 2021-10-13: 50 mg via ORAL
  Filled 2021-10-13: qty 2

## 2021-10-13 MED ORDER — SODIUM CHLORIDE 0.9 % IV SOLN
10.0000 mg | Freq: Once | INTRAVENOUS | Status: AC
  Administered 2021-10-13: 10 mg via INTRAVENOUS
  Filled 2021-10-13: qty 10

## 2021-10-13 MED ORDER — SODIUM CHLORIDE 0.9% FLUSH
10.0000 mL | INTRAVENOUS | Status: DC | PRN
  Administered 2021-10-13: 10 mL

## 2021-10-13 MED ORDER — HEPARIN SOD (PORK) LOCK FLUSH 100 UNIT/ML IV SOLN
500.0000 [IU] | Freq: Once | INTRAVENOUS | Status: AC | PRN
  Administered 2021-10-13: 500 [IU]

## 2021-10-13 MED ORDER — ACETAMINOPHEN 325 MG PO TABS
650.0000 mg | ORAL_TABLET | Freq: Once | ORAL | Status: AC
  Administered 2021-10-13: 650 mg via ORAL
  Filled 2021-10-13: qty 2

## 2021-10-13 MED ORDER — TRASTUZUMAB-DKST CHEMO 150 MG IV SOLR
6.0000 mg/kg | Freq: Once | INTRAVENOUS | Status: AC
  Administered 2021-10-13: 462 mg via INTRAVENOUS
  Filled 2021-10-13: qty 22

## 2021-10-13 MED ORDER — SODIUM CHLORIDE 0.9 % IV SOLN: Freq: Once | INTRAVENOUS | Status: AC

## 2021-10-13 MED ORDER — SODIUM CHLORIDE 0.9 % IV SOLN
75.0000 mg/m2 | Freq: Once | INTRAVENOUS | Status: AC
  Administered 2021-10-13: 140 mg via INTRAVENOUS
  Filled 2021-10-13: qty 14

## 2021-10-13 MED ORDER — SODIUM CHLORIDE 0.9 % IV SOLN
420.0000 mg | Freq: Once | INTRAVENOUS | Status: AC
  Administered 2021-10-13: 420 mg via INTRAVENOUS
  Filled 2021-10-13: qty 14

## 2021-10-13 NOTE — Patient Instructions (Signed)
Pertuzumab injection What is this medication? PERTUZUMAB (per TOOZ ue mab) is a monoclonal antibody. It is used to treat breast cancer. This medicine may be used for other purposes; ask your health care provider or pharmacist if you have questions. COMMON BRAND NAME(S): PERJETA What should I tell my care team before I take this medication? They need to know if you have any of these conditions: heart disease heart failure high blood pressure history of irregular heart beat recent or ongoing radiation therapy an unusual or allergic reaction to pertuzumab, other medicines, foods, dyes, or preservatives pregnant or trying to get pregnant breast-feeding How should I use this medication? This medicine is for infusion into a vein. It is given by a health care professional in a hospital or clinic setting. Talk to your pediatrician regarding the use of this medicine in children. Special care may be needed. Overdosage: If you think you have taken too much of this medicine contact a poison control center or emergency room at once. NOTE: This medicine is only for you. Do not share this medicine with others. What if I miss a dose? It is important not to miss your dose. Call your doctor or health care professional if you are unable to keep an appointment. What may interact with this medication? Interactions are not expected. Give your health care provider a list of all the medicines, herbs, non-prescription drugs, or dietary supplements you use. Also tell them if you smoke, drink alcohol, or use illegal drugs. Some items may interact with your medicine. This list may not describe all possible interactions. Give your health care provider a list of all the medicines, herbs, non-prescription drugs, or dietary supplements you use. Also tell them if you smoke, drink alcohol, or use illegal drugs. Some items may interact with your medicine. What should I watch for while using this medication? Your condition  will be monitored carefully while you are receiving this medicine. Report any side effects. Continue your course of treatment even though you feel ill unless your doctor tells you to stop. Do not become pregnant while taking this medicine or for 7 months after stopping it. Women should inform their doctor if they wish to become pregnant or think they might be pregnant. Women of child-bearing potential will need to have a negative pregnancy test before starting this medicine. There is a potential for serious side effects to an unborn child. Talk to your health care professional or pharmacist for more information. Do not breast-feed an infant while taking this medicine or for 7 months after stopping it. Women must use effective birth control with this medicine. Call your doctor or health care professional for advice if you get a fever, chills or sore throat, or other symptoms of a cold or flu. Do not treat yourself. Try to avoid being around people who are sick. You may experience fever, chills, and headache during the infusion. Report any side effects during the infusion to your health care professional. What side effects may I notice from receiving this medication? Side effects that you should report to your doctor or health care professional as soon as possible: breathing problems chest pain or palpitations dizziness feeling faint or lightheaded fever or chills skin rash, itching or hives sore throat swelling of the face, lips, or tongue swelling of the legs or ankles unusually weak or tired Side effects that usually do not require medical attention (report to your doctor or health care professional if they continue or are bothersome): diarrhea hair loss  nausea, vomiting tiredness This list may not describe all possible side effects. Call your doctor for medical advice about side effects. You may report side effects to FDA at 1-800-FDA-1088. Where should I keep my medication? This drug is  given in a hospital or clinic and will not be stored at home. NOTE: This sheet is a summary. It may not cover all possible information. If you have questions about this medicine, talk to your doctor, pharmacist, or health care provider.  2022 Elsevier/Gold Standard (2015-09-01 00:00:00) Trastuzumab injection for infusion What is this medication? TRASTUZUMAB (tras TOO zoo mab) is a monoclonal antibody. It is used to treat breast cancer and stomach cancer. This medicine may be used for other purposes; ask your health care provider or pharmacist if you have questions. COMMON BRAND NAME(S): Herceptin, Janae Bridgeman, Ontruzant, Trazimera What should I tell my care team before I take this medication? They need to know if you have any of these conditions: heart disease heart failure lung or breathing disease, like asthma an unusual or allergic reaction to trastuzumab, benzyl alcohol, or other medications, foods, dyes, or preservatives pregnant or trying to get pregnant breast-feeding How should I use this medication? This drug is given as an infusion into a vein. It is administered in a hospital or clinic by a specially trained health care professional. Talk to your pediatrician regarding the use of this medicine in children. This medicine is not approved for use in children. Overdosage: If you think you have taken too much of this medicine contact a poison control center or emergency room at once. NOTE: This medicine is only for you. Do not share this medicine with others. What if I miss a dose? It is important not to miss a dose. Call your doctor or health care professional if you are unable to keep an appointment. What may interact with this medication? This medicine may interact with the following medications: certain types of chemotherapy, such as daunorubicin, doxorubicin, epirubicin, and idarubicin This list may not describe all possible interactions. Give your health care  provider a list of all the medicines, herbs, non-prescription drugs, or dietary supplements you use. Also tell them if you smoke, drink alcohol, or use illegal drugs. Some items may interact with your medicine. What should I watch for while using this medication? Visit your doctor for checks on your progress. Report any side effects. Continue your course of treatment even though you feel ill unless your doctor tells you to stop. Call your doctor or health care professional for advice if you get a fever, chills or sore throat, or other symptoms of a cold or flu. Do not treat yourself. Try to avoid being around people who are sick. You may experience fever, chills and shaking during your first infusion. These effects are usually mild and can be treated with other medicines. Report any side effects during the infusion to your health care professional. Fever and chills usually do not happen with later infusions. Do not become pregnant while taking this medicine or for 7 months after stopping it. Women should inform their doctor if they wish to become pregnant or think they might be pregnant. Women of child-bearing potential will need to have a negative pregnancy test before starting this medicine. There is a potential for serious side effects to an unborn child. Talk to your health care professional or pharmacist for more information. Do not breast-feed an infant while taking this medicine or for 7 months after stopping it. Women must use  effective birth control with this medicine. What side effects may I notice from receiving this medication? Side effects that you should report to your doctor or health care professional as soon as possible: allergic reactions like skin rash, itching or hives, swelling of the face, lips, or tongue chest pain or palpitations cough dizziness feeling faint or lightheaded, falls fever general ill feeling or flu-like symptoms signs of worsening heart failure like breathing  problems; swelling in your legs and feet unusually weak or tired Side effects that usually do not require medical attention (report to your doctor or health care professional if they continue or are bothersome): bone pain changes in taste diarrhea joint pain nausea/vomiting weight loss This list may not describe all possible side effects. Call your doctor for medical advice about side effects. You may report side effects to FDA at 1-800-FDA-1088. Where should I keep my medication? This drug is given in a hospital or clinic and will not be stored at home. NOTE: This sheet is a summary. It may not cover all possible information. If you have questions about this medicine, talk to your doctor, pharmacist, or health care provider.  2022 Elsevier/Gold Standard (2016-08-14 00:00:00) Docetaxel injection What is this medication? DOCETAXEL (doe se TAX el) is a chemotherapy drug. It targets fast dividing cells, like cancer cells, and causes these cells to die. This medicine is used to treat many types of cancers like breast cancer, certain stomach cancers, head and neck cancer, lung cancer, and prostate cancer. This medicine may be used for other purposes; ask your health care provider or pharmacist if you have questions. COMMON BRAND NAME(S): Docefrez, Taxotere What should I tell my care team before I take this medication? They need to know if you have any of these conditions: infection (especially a virus infection such as chickenpox, cold sores, or herpes) liver disease low blood counts, like low white cell, platelet, or red cell counts an unusual or allergic reaction to docetaxel, polysorbate 80, other chemotherapy agents, other medicines, foods, dyes, or preservatives pregnant or trying to get pregnant breast-feeding How should I use this medication? This drug is given as an infusion into a vein. It is administered in a hospital or clinic by a specially trained health care professional. Talk  to your pediatrician regarding the use of this medicine in children. Special care may be needed. Overdosage: If you think you have taken too much of this medicine contact a poison control center or emergency room at once. NOTE: This medicine is only for you. Do not share this medicine with others. What if I miss a dose? It is important not to miss your dose. Call your doctor or health care professional if you are unable to keep an appointment. What may interact with this medication? Do not take this medicine with any of the following medications: live virus vaccines This medicine may also interact with the following medications: aprepitant certain antibiotics like erythromycin or clarithromycin certain antivirals for HIV or hepatitis certain medicines for fungal infections like fluconazole, itraconazole, ketoconazole, posaconazole, or voriconazole cimetidine ciprofloxacin conivaptan cyclosporine dronedarone fluvoxamine grapefruit juice imatinib verapamil This list may not describe all possible interactions. Give your health care provider a list of all the medicines, herbs, non-prescription drugs, or dietary supplements you use. Also tell them if you smoke, drink alcohol, or use illegal drugs. Some items may interact with your medicine. What should I watch for while using this medication? Your condition will be monitored carefully while you are receiving this medicine.  You will need important blood work done while you are taking this medicine. Call your doctor or health care professional for advice if you get a fever, chills or sore throat, or other symptoms of a cold or flu. Do not treat yourself. This drug decreases your body's ability to fight infections. Try to avoid being around people who are sick. Some products may contain alcohol. Ask your health care professional if this medicine contains alcohol. Be sure to tell all health care professionals you are taking this medicine. Certain  medicines, like metronidazole and disulfiram, can cause an unpleasant reaction when taken with alcohol. The reaction includes flushing, headache, nausea, vomiting, sweating, and increased thirst. The reaction can last from 30 minutes to several hours. You may get drowsy or dizzy. Do not drive, use machinery, or do anything that needs mental alertness until you know how this medicine affects you. Do not stand or sit up quickly, especially if you are an older patient. This reduces the risk of dizzy or fainting spells. Alcohol may interfere with the effect of this medicine. Talk to your health care professional about your risk of cancer. You may be more at risk for certain types of cancer if you take this medicine. Do not become pregnant while taking this medicine or for 6 months after stopping it. Women should inform their doctor if they wish to become pregnant or think they might be pregnant. There is a potential for serious side effects to an unborn child. Talk to your health care professional or pharmacist for more information. Do not breast-feed an infant while taking this medicine or for 1 week after stopping it. Males who get this medicine must use a condom during sex with females who can get pregnant. If you get a woman pregnant, the baby could have birth defects. The baby could die before they are born. You will need to continue wearing a condom for 3 months after stopping the medicine. Tell your health care provider right away if your partner becomes pregnant while you are taking this medicine. This may interfere with the ability to father a child. You should talk to your doctor or health care professional if you are concerned about your fertility. What side effects may I notice from receiving this medication? Side effects that you should report to your doctor or health care professional as soon as possible: allergic reactions like skin rash, itching or hives, swelling of the face, lips, or  tongue blurred vision breathing problems changes in vision low blood counts - This drug may decrease the number of white blood cells, red blood cells and platelets. You may be at increased risk for infections and bleeding. nausea and vomiting pain, redness or irritation at site where injected pain, tingling, numbness in the hands or feet redness, blistering, peeling, or loosening of the skin, including inside the mouth signs of decreased platelets or bleeding - bruising, pinpoint red spots on the skin, black, tarry stools, nosebleeds signs of decreased red blood cells - unusually weak or tired, fainting spells, lightheadedness signs of infection - fever or chills, cough, sore throat, pain or difficulty passing urine swelling of the ankle, feet, hands Side effects that usually do not require medical attention (report to your doctor or health care professional if they continue or are bothersome): constipation diarrhea fingernail or toenail changes hair loss loss of appetite mouth sores muscle pain This list may not describe all possible side effects. Call your doctor for medical advice about side effects. You may  report side effects to FDA at 1-800-FDA-1088. Where should I keep my medication? This drug is given in a hospital or clinic and will not be stored at home. NOTE: This sheet is a summary. It may not cover all possible information. If you have questions about this medicine, talk to your doctor, pharmacist, or health care provider.  2022 Elsevier/Gold Standard (2021-04-18 00:00:00)

## 2021-10-16 ENCOUNTER — Other Ambulatory Visit: Payer: Self-pay

## 2021-10-16 ENCOUNTER — Inpatient Hospital Stay: Payer: BC Managed Care – PPO

## 2021-10-16 VITALS — BP 146/78 | HR 96 | Temp 99.1°F | Resp 20 | Ht 60.0 in | Wt 166.2 lb

## 2021-10-16 DIAGNOSIS — C7951 Secondary malignant neoplasm of bone: Secondary | ICD-10-CM

## 2021-10-16 DIAGNOSIS — Z5111 Encounter for antineoplastic chemotherapy: Secondary | ICD-10-CM | POA: Diagnosis not present

## 2021-10-16 DIAGNOSIS — C50412 Malignant neoplasm of upper-outer quadrant of left female breast: Secondary | ICD-10-CM

## 2021-10-16 MED ORDER — PEGFILGRASTIM-CBQV 6 MG/0.6ML ~~LOC~~ SOSY
6.0000 mg | PREFILLED_SYRINGE | Freq: Once | SUBCUTANEOUS | Status: AC
  Administered 2021-10-16: 6 mg via SUBCUTANEOUS
  Filled 2021-10-16: qty 0.6

## 2021-10-16 NOTE — Patient Instructions (Signed)

## 2021-10-18 ENCOUNTER — Other Ambulatory Visit: Payer: Self-pay

## 2021-10-18 ENCOUNTER — Encounter: Payer: Self-pay | Admitting: Oncology

## 2021-10-18 DIAGNOSIS — C50412 Malignant neoplasm of upper-outer quadrant of left female breast: Secondary | ICD-10-CM

## 2021-10-18 DIAGNOSIS — M25551 Pain in right hip: Secondary | ICD-10-CM

## 2021-10-18 DIAGNOSIS — C7951 Secondary malignant neoplasm of bone: Secondary | ICD-10-CM

## 2021-10-18 MED ORDER — MORPHINE SULFATE ER 15 MG PO TBCR: 15.0000 mg | EXTENDED_RELEASE_TABLET | Freq: Two times a day (BID) | ORAL | 0 refills | Status: DC

## 2021-10-19 ENCOUNTER — Telehealth: Payer: Self-pay

## 2021-10-19 NOTE — Telephone Encounter (Signed)
-----   Message from Derwood Kaplan, MD sent at 10/18/2021  7:47 PM EST ----- ?Regarding: call ?Tell her vitamin levels are all good, in fact the B12 is sky high.  Is she taking B12? If so, can stop it ? ?

## 2021-10-20 ENCOUNTER — Encounter: Payer: Self-pay | Admitting: Hematology and Oncology

## 2021-10-20 ENCOUNTER — Other Ambulatory Visit: Payer: Self-pay

## 2021-10-20 ENCOUNTER — Inpatient Hospital Stay: Payer: BC Managed Care – PPO

## 2021-10-20 ENCOUNTER — Inpatient Hospital Stay (INDEPENDENT_AMBULATORY_CARE_PROVIDER_SITE_OTHER): Payer: BC Managed Care – PPO | Admitting: Hematology and Oncology

## 2021-10-20 ENCOUNTER — Encounter: Payer: Self-pay | Admitting: Oncology

## 2021-10-20 ENCOUNTER — Telehealth: Payer: Self-pay

## 2021-10-20 VITALS — BP 118/74 | HR 121 | Temp 98.3°F | Resp 18 | Ht 59.0 in | Wt 162.2 lb

## 2021-10-20 DIAGNOSIS — C7951 Secondary malignant neoplasm of bone: Secondary | ICD-10-CM | POA: Diagnosis not present

## 2021-10-20 DIAGNOSIS — C50412 Malignant neoplasm of upper-outer quadrant of left female breast: Secondary | ICD-10-CM

## 2021-10-20 DIAGNOSIS — K760 Fatty (change of) liver, not elsewhere classified: Secondary | ICD-10-CM | POA: Diagnosis not present

## 2021-10-20 DIAGNOSIS — Z170 Estrogen receptor positive status [ER+]: Secondary | ICD-10-CM

## 2021-10-20 DIAGNOSIS — Z5111 Encounter for antineoplastic chemotherapy: Secondary | ICD-10-CM | POA: Diagnosis not present

## 2021-10-20 LAB — HEPATIC FUNCTION PANEL
ALT: 24 U/L (ref 7–35)
AST: 25 (ref 13–35)
Alkaline Phosphatase: 158 — AB (ref 25–125)
Bilirubin, Total: 0.9

## 2021-10-20 LAB — COMPREHENSIVE METABOLIC PANEL
Albumin: 3.9 (ref 3.5–5.0)
Calcium: 8.6 — AB (ref 8.7–10.7)

## 2021-10-20 LAB — CBC AND DIFFERENTIAL
HCT: 37 (ref 36–46)
Hemoglobin: 11.3 — AB (ref 12.0–16.0)
Neutrophils Absolute: 4.75
Platelets: 237 10*3/uL (ref 150–400)
WBC: 6.6

## 2021-10-20 LAB — BASIC METABOLIC PANEL
BUN: 11 (ref 4–21)
CO2: 24 — AB (ref 13–22)
Chloride: 110 — AB (ref 99–108)
Creatinine: 0.6 (ref 0.5–1.1)
Glucose: 143
Potassium: 4.3 mEq/L (ref 3.5–5.1)
Sodium: 141 (ref 137–147)

## 2021-10-20 LAB — CBC: RBC: 4.76 (ref 3.87–5.11)

## 2021-10-20 MED ORDER — SODIUM CHLORIDE 0.9 % IV SOLN
8.0000 mg | Freq: Once | INTRAVENOUS | Status: DC
  Administered 2021-10-20: 8 mg via INTRAVENOUS

## 2021-10-20 MED ORDER — SODIUM CHLORIDE 0.9 % IV SOLN: INTRAVENOUS | Status: DC

## 2021-10-20 MED ORDER — SODIUM CHLORIDE 0.9 % IV SOLN: Freq: Once | INTRAVENOUS | Status: AC

## 2021-10-20 MED ORDER — SODIUM CHLORIDE 0.9% FLUSH
10.0000 mL | INTRAVENOUS | Status: DC | PRN
  Administered 2021-10-20: 10 mL

## 2021-10-20 MED ORDER — DIPHENOXYLATE-ATROPINE 2.5-0.025 MG PO TABS: 2.0000 | ORAL_TABLET | Freq: Four times a day (QID) | ORAL | 0 refills | Status: DC | PRN

## 2021-10-20 MED ORDER — HEPARIN SOD (PORK) LOCK FLUSH 100 UNIT/ML IV SOLN
500.0000 [IU] | Freq: Once | INTRAVENOUS | Status: AC | PRN
  Administered 2021-10-20: 500 [IU]

## 2021-10-20 NOTE — Assessment & Plan Note (Addendum)
New bone metastases, November 2022, with multiple marrow replacing lesions within the proximal right femur within the ischium as well as the inferior rami. 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 Ki67 was 60%. PET imaging from December 19th revealed dominant finding of intensely hypermetabolic aggressive skeletal metastasis involving the calvarium, sternum, thoracic and lumbar spine, and pelvis, with potential pathologic fracture of the right femur with internal fixation. There is soft tissue extension into the anterior mediastinum associated with the manubrial metastasis. Multiple spinal vertebral body lesions have cortical destruction along the posterior margin.?She started monthly Xgeva in December, and recently completed radiation therapy. She is now receiving THP and will plan for hormonal therapy. She has completed 2 cycles of treatment. This last treatment was rough on her. She has had ongoing nausea and diarrhea. We will plan for 1 liter of IVF today with nausea meds. I will send in Lomotil for home use.  ?

## 2021-10-20 NOTE — Progress Notes (Cosign Needed)
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 She was on adjuvant hormonal therapy with tamoxifen 20 mg daily from October 2015 to May 2020, but due to elevation of the liver transaminases was switched to anastrazole  Hepatic steatosis determined by biopsy of liver Severe hepatic steatosis with elevation of the liver transaminases, nearly normalized now.  She continues to follow with hepatology, Dr. Beverley Fiedler every 6 months who has recommended annual ultrasounds of the liver.  Bone metastases ([place]) New bone metastases, November 2022, with multiple marrow replacing lesions within the proximal right femur within the ischium as well as the inferior rami. 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 Ki67 was 60%. PET imaging from December 19th revealed dominant finding of intensely hypermetabolic aggressive skeletal metastasis involving the calvarium, sternum, thoracic and lumbar spine, and pelvis, with potential pathologic fracture of the right femur with internal fixation. There is soft tissue extension into the anterior mediastinum associated with the manubrial metastasis. Multiple spinal vertebral body lesions have cortical destruction along the posterior margin. She started monthly Xgeva in December, and recently completed radiation therapy. She is now  receiving THP and will plan for hormonal therapy. She has completed 2 cycles of treatment. This last treatment was rough on her. She has had ongoing nausea and diarrhea. We will plan for 1 liter of IVF today with nausea meds. I will send in Lomotil for home use.    The patient understands the plans discussed today and is in agreement with them.  She knows to contact our office if she develops concerns prior to her next appointment.   Melodye Ped, NP  [facility] [address] [place] [address] Dept: [phone] Dept Fax: [phone]   No orders of the defined types were placed in this encounter.     CHIEF COMPLAINT:  CC: A 51 year old female with history of breast cancer here for 1 week follow up  Current Treatment:  THP  INTERVAL HISTORY:  Anna [name] is here today for repeat clinical assessment. She denies fevers or chills. She denies pain. Her appetite is good. Her weight has decreased 4 pounds over last week .  I have reviewed the past medical history, past surgical history, social history and family history with the patient and they are unchanged from previous note.  ALLERGIES:  has No Known Allergies.  MEDICATIONS:  Current Outpatient Medications  Medication Sig Dispense Refill   diphenoxylate-atropine (LOMOTIL) 2.5-0.025 MG tablet Take 2 tablets by mouth 4 (four) times daily as needed for diarrhea or loose stools. 30 tablet 0   albuterol (VENTOLIN HFA) 108 (90 Base) MCG/ACT inhaler Inhale 2 puffs into the lungs every 6 (six) hours as needed for shortness of breath.     ARIPiprazole (ABILIFY) 5 MG tablet Take 2.5 mg by mouth daily.     aspirin 81 MG EC tablet Take 81  mg by mouth daily. Swallow whole.     atorvastatin (LIPITOR) 20 MG tablet Take 1 tablet (20 mg total) by mouth daily. 90 tablet 3   calcium carbonate (OS-CAL) 1250 (500 Ca) MG chewable tablet Chew 6 tablets by mouth daily.     celecoxib  (CELEBREX) 200 MG capsule Take 1 capsule (200 mg total) by mouth 2 (two) times daily. To start this after finishing Xarelto 60 capsule 0   desvenlafaxine (PRISTIQ) 50 MG 24 hr tablet Take 50 mg by mouth daily.     dexamethasone (DECADRON) 4 MG tablet Take 2 tabs twice daily beginning the day before chemotherapy and for 2 days after, every 3 weeks, by mouth 60 tablet 0   FARXIGA 10 MG TABS tablet Take 1 tablet (10 mg total) by mouth daily. 90 tablet 3   fluconazole (DIFLUCAN) 150 MG tablet Take 1 tablet (150 mg total) by mouth daily. (Patient not taking: Reported on 10/20/2021) 2 tablet 3   fluticasone (FLONASE) 50 MCG/ACT nasal spray Place 1 spray into both nostrils daily.     labetalol (NORMODYNE) 200 MG tablet Take 1 tablet (200 mg total) by mouth 2 (two) times daily. 180 tablet 3   Loratadine 10 MG CAPS Take 1 capsule (10 mg total) by mouth daily. 30 capsule 6   LORazepam (ATIVAN) 1 MG tablet Take 1 tablet (1 mg total) by mouth every 8 (eight) hours. 30 tablet 0   magic mouthwash (lidocaine, diphenhydrAMINE, alum & mag hydroxide) suspension Take 5 mLs by mouth every 3 (three) hours as needed. Swish and spit; for throat/mouth pain     morphine (MS CONTIN) 15 MG 12 hr tablet Take 1 tablet (15 mg total) by mouth every 12 (twelve) hours. 60 tablet 0   mupirocin nasal ointment (BACTROBAN NASAL) 2 % Place 1 application into the nose 2 (two) times daily. Use one-half of tube in each nostril twice daily for five (5) days. After application, press sides of nose together and gently massage. 10 g 0   nitroGLYCERIN (NITROSTAT) 0.4 MG SL tablet Place 0.4 mg under the tongue every 5 (five) minutes as needed for chest pain.     nystatin (MYCOSTATIN) 100000 UNIT/ML suspension Take 5 mLs (500,000 Units total) by mouth 4 (four) times daily. 473 mL 0   omeprazole (PRILOSEC) 40 MG capsule Take 1 capsule (40 mg total) by mouth daily. 30 capsule 5   ondansetron (ZOFRAN) 4 MG tablet Take 1 tablet (4 mg total) by mouth  every 4 (four) hours as needed for nausea. 90 tablet 3   Oxycodone HCl 10 MG TABS Take 1 tablet (10 mg total) by mouth every 4 (four) hours as needed. 120 tablet 0   OZEMPIC, 0.25 OR 0.5 MG/DOSE, 2 MG/1.5ML SOPN Inject into the skin.     potassium chloride SA (KLOR-CON M) 20 MEQ tablet Take 1 tablet (20 mEq total) by mouth 2 (two) times daily. 60 tablet 0   prochlorperazine (COMPAZINE) 10 MG tablet Take 1 tablet (10 mg total) by mouth every 6 (six) hours as needed for nausea or vomiting. 90 tablet 3   topiramate (TOPAMAX) 50 MG tablet Take 50 mg by mouth daily.     valsartan (DIOVAN) 40 MG tablet Take 1 tablet (40 mg total) by mouth daily. 90 tablet 3   Vitamin D, Ergocalciferol, (DRISDOL) 1.25 MG (50000 UNIT) CAPS capsule Take 50,000 Units by mouth once a week.     Current Facility-Administered Medications  Medication Dose Route Frequency Provider Last Rate  Last Admin   0.9 %  sodium chloride infusion  500 mL Intravenous Once Jackquline Denmark, MD       0.9 %  sodium chloride infusion   Intravenous Continuous [place] Scrape A, NP       heparin lock flush 100 unit/mL  500 Units Intracatheter Once PRN [place] Scrape A, NP       sodium chloride flush (NS) 0.9 % injection 10 mL  10 mL Intracatheter PRN [place] Scrape A, NP   10 mL at 10/20/21 1200   technetium sestamibi generic (CARDIOLITE) injection 08.8 millicurie  11.0 millicurie Intravenous Once PRN Revankar, Reita Cliche, MD        HISTORY OF PRESENT ILLNESS:   Oncology History  Malignant neoplasm of upper-outer quadrant of left female breast ([place])  07/23/2013 Initial Diagnosis   Malignant neoplasm of upper-outer quadrant of left female breast ([place])   07/23/2013 Cancer Staging   Staging form: Breast, AJCC 7th Edition - Clinical stage from 07/23/2013: Stage IIB (T2, N1, M0) - Signed by Derwood Kaplan, MD on 07/04/2020 Prognostic indicators: Pos LVI    09/22/2021 -  Chemotherapy   Patient is on Treatment Plan : BREAST DOCEtaxel +  Trastuzumab + Pertuzumab (THP) q21d x 8 cycles / Trastuzumab + Pertuzumab q21d x 4 cycles     Bone metastases ([place])  06/28/2021 Initial Diagnosis   Bone metastases ([place])   09/22/2021 -  Chemotherapy   Patient is on Treatment Plan : BREAST DOCEtaxel + Trastuzumab + Pertuzumab (THP) q21d x 8 cycles / Trastuzumab + Pertuzumab q21d x 4 cycles         REVIEW OF SYSTEMS:   Constitutional: Denies fevers, chills or abnormal weight loss Eyes: Denies blurriness of vision Ears, nose, mouth, throat, and face: Denies mucositis or sore throat Respiratory: Denies cough, dyspnea or wheezes Cardiovascular: Denies palpitation, chest discomfort or lower extremity swelling Gastrointestinal:  Denies nausea, heartburn or change in bowel habits Skin: Denies abnormal skin rashes Lymphatics: Denies new lymphadenopathy or easy bruising Neurological:Denies numbness, tingling or new weaknesses Behavioral/Psych: Mood is stable, no new changes  All other systems were reviewed with the patient and are negative.   VITALS:  Blood pressure 99/60, pulse (!) 118, temperature 98.2 F (36.8 C), temperature source Oral, resp. rate 18, height 4' 11"  (1.499 m), weight 162 lb 3.2 oz (73.6 kg), last menstrual period 08/18/2013, SpO2 93 %.  Wt Readings from Last 3 Encounters:  10/20/21 162 lb 3.2 oz (73.6 kg)  10/16/21 166 lb 4 oz (75.4 kg)  10/13/21 170 lb (77.1 kg)    Body mass index is 32.76 kg/m.  Performance status (ECOG): 1 - Symptomatic but completely ambulatory  PHYSICAL EXAM:   GENERAL:alert, no distress and comfortable SKIN: skin color, texture, turgor are normal, no rashes or significant lesions EYES: normal, Conjunctiva are pink and non-injected, sclera clear OROPHARYNX:no exudate, no erythema and lips, buccal mucosa, and tongue normal  NECK: supple, thyroid normal size, non-tender, without nodularity LYMPH:  no palpable lymphadenopathy in the cervical, axillary or inguinal LUNGS: clear to  auscultation and percussion with normal breathing effort HEART: regular rate & rhythm and no murmurs and no lower extremity edema ABDOMEN:abdomen soft, non-tender and normal bowel sounds Musculoskeletal:no cyanosis of digits and no clubbing  NEURO: alert & oriented x 3 with fluent speech, no focal motor/sensory deficits  LABORATORY DATA:  I have reviewed the data as listed    Component Value Date/Time   NA 141 10/20/2021 0000   K 4.3 10/20/2021  0000   CL 110 (A) 10/20/2021 0000   CO2 24 (A) 10/20/2021 0000   GLUCOSE 147 (H) 07/05/2021 0330   BUN 11 10/20/2021 0000   CREATININE 0.6 10/20/2021 0000   CREATININE 0.71 07/05/2021 0330   CALCIUM 8.6 (A) 10/20/2021 0000   PROT 7.7 06/30/2021 0833   ALBUMIN 3.9 10/20/2021 0000   AST 25 10/20/2021 0000   ALT 24 10/20/2021 0000   ALKPHOS 158 (A) 10/20/2021 0000   BILITOT 0.7 06/30/2021 0833   GFRNONAA >60 07/05/2021 0330    No results found for: SPEP, UPEP  Lab Results  Component Value Date   WBC 6.6 10/20/2021   NEUTROABS 4.75 10/20/2021   HGB 11.3 (A) 10/20/2021   HCT 37 10/20/2021   MCV 79 (A) 09/06/2021   PLT 237 10/20/2021      Chemistry      Component Value Date/Time   NA 141 10/20/2021 0000   K 4.3 10/20/2021 0000   CL 110 (A) 10/20/2021 0000   CO2 24 (A) 10/20/2021 0000   BUN 11 10/20/2021 0000   CREATININE 0.6 10/20/2021 0000   CREATININE 0.71 07/05/2021 0330   GLU 143 10/20/2021 0000      Component Value Date/Time   CALCIUM 8.6 (A) 10/20/2021 0000   ALKPHOS 158 (A) 10/20/2021 0000   AST 25 10/20/2021 0000   ALT 24 10/20/2021 0000   BILITOT 0.7 06/30/2021 9611       RADIOGRAPHIC STUDIES: I have personally reviewed the radiological images as listed and agreed with the findings in the report. No results found.

## 2021-10-20 NOTE — Progress Notes (Signed)
Face to face with pt and  her husband in [employer]. Pt reports that she is doing okay with the recent hip surgery and remains on her walker. She is getting chemo every three weeks and reports that she is doing okay. Pt is very positive and does not complain but appears weak and tired today. Pt had chemo recently so this is to be expected. She is her today for a Med Onc followup. Husband is present and supportive. Encouraged pt to call with questions or concerns. ? ?

## 2021-10-20 NOTE — Telephone Encounter (Signed)
Patient notified at appointment today. ?

## 2021-10-20 NOTE — Assessment & Plan Note (Signed)
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 She was on adjuvant hormonal therapy with tamoxifen 20 mg daily from October 2015 to May 2020, but due to elevation of the liver transaminases was switched to anastrazole ?

## 2021-10-20 NOTE — Assessment & Plan Note (Signed)
Severe hepatic steatosis with elevation of the liver transaminases, nearly normalized now. ?She continues to follow with hepatology, Dr. Beverley Fiedler every 6 months who has recommended annual ultrasounds of the liver. ?

## 2021-10-20 NOTE — Telephone Encounter (Signed)
-----   Message from Belva Chimes, LPN sent at 3/37/4451 10:26 AM EST ----- ?Regarding: FW: call ?Can you let patient know about this. Since she is seeing you today. I have attempted to call patient but no answer. ? ?Thanks ?Sammuel Cooper ?----- Message ----- ?From: Derwood Kaplan, MD ?Sent: 10/18/2021   7:47 PM EST ?To: Belva Chimes, LPN ?Subject: call                                          ? ?Tell her vitamin levels are all good, in fact the B12 is sky high.  Is she taking B12? If so, can stop it ? ? ?

## 2021-10-24 ENCOUNTER — Telehealth: Payer: Self-pay | Admitting: Dietician

## 2021-10-24 NOTE — Telephone Encounter (Signed)
Patient screened on MST. Second attempt to reach. Provided my cell# on voice mail to return call to set up a nutrition consult. Also sent text with contact information. ? ?April Manson, RDN, LDN ?Registered Dietitian, [place] ?Part Time Remote (Usual office hours: Tuesday-Thursday) ?Cell: [phone]   ?

## 2021-10-27 ENCOUNTER — Inpatient Hospital Stay (INDEPENDENT_AMBULATORY_CARE_PROVIDER_SITE_OTHER): Payer: BC Managed Care – PPO | Admitting: Hematology and Oncology

## 2021-10-27 ENCOUNTER — Encounter: Payer: Self-pay | Admitting: Hematology and Oncology

## 2021-10-27 ENCOUNTER — Inpatient Hospital Stay: Payer: BC Managed Care – PPO

## 2021-10-27 DIAGNOSIS — Z170 Estrogen receptor positive status [ER+]: Secondary | ICD-10-CM

## 2021-10-27 DIAGNOSIS — C7951 Secondary malignant neoplasm of bone: Secondary | ICD-10-CM

## 2021-10-27 DIAGNOSIS — C50412 Malignant neoplasm of upper-outer quadrant of left female breast: Secondary | ICD-10-CM

## 2021-10-27 DIAGNOSIS — K760 Fatty (change of) liver, not elsewhere classified: Secondary | ICD-10-CM

## 2021-10-27 LAB — COMPREHENSIVE METABOLIC PANEL
Albumin: 3.7 (ref 3.5–5.0)
Calcium: 8.5 — AB (ref 8.7–10.7)

## 2021-10-27 LAB — CBC AND DIFFERENTIAL
HCT: 34 — AB (ref 36–46)
Hemoglobin: 10.7 — AB (ref 12.0–16.0)
Neutrophils Absolute: 9.08
Platelets: 174 10*3/uL (ref 150–400)
WBC: 10.2

## 2021-10-27 LAB — HEPATIC FUNCTION PANEL
ALT: 22 U/L (ref 7–35)
AST: 22 (ref 13–35)
Alkaline Phosphatase: 170 — AB (ref 25–125)
Bilirubin, Total: 0.6

## 2021-10-27 LAB — CBC: RBC: 4.51 (ref 3.87–5.11)

## 2021-10-27 LAB — BASIC METABOLIC PANEL
BUN: 15 (ref 4–21)
CO2: 27 — AB (ref 13–22)
Chloride: 107 (ref 99–108)
Creatinine: 0.6 (ref 0.5–1.1)
Glucose: 121
Potassium: 3.2 mEq/L — AB (ref 3.5–5.1)
Sodium: 139 (ref 137–147)

## 2021-10-27 LAB — MAGNESIUM: Magnesium: 1.9

## 2021-10-27 NOTE — Assessment & Plan Note (Signed)
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 She was on adjuvant hormonal therapy with tamoxifen 20 mg daily from October 2015 to May 2020, but due to elevation of the liver transaminases was switched to anastrazole ?

## 2021-10-27 NOTE — Assessment & Plan Note (Signed)
New bone metastases, November 2022, with multiple marrow replacing lesions within the proximal right femur within the ischium as well as the inferior rami. 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 Ki67 was 60%. PET imaging from December 19th revealed dominant finding of intensely hypermetabolic aggressive skeletal metastasis involving the calvarium, sternum, thoracic and lumbar spine, and pelvis, with potential pathologic fracture of the right femur with internal fixation. There is soft tissue extension into the anterior mediastinum associated with the manubrial metastasis. Multiple spinal vertebral body lesions have cortical destruction along the posterior margin.?She started monthly Xgeva in December, and recently completed radiation therapy. She is now receiving THP and will plan for hormonal therapy. She has completed 2 cycles of treatment. This last treatment was rough on her. We brought her in for IVF and antiemetics. Today, she feels much better, but we will plan on a 20% dose reduction for her upcoming cycles. She will return to clinic in one week for repeat evaluation.  ?

## 2021-10-27 NOTE — Progress Notes (Signed)
?Patient Care Team: ?Bonnita Nasuti, MD as PCP - General (Internal Medicine) ?Berniece Salines, DO as PCP - Cardiology (Cardiology) ?Derwood Kaplan, MD as Consulting Physician (Oncology) ?Gatha Mayer, MD as Consulting Physician (Radiation Oncology) ?Laurell Roof, RN as Registered Nurse ? ?Clinic Day:  10/27/2021 ? ?Referring physician: Bonnita Nasuti, MD ? ?ASSESSMENT & PLAN:  ? 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 She was on adjuvant hormonal therapy with tamoxifen 20 mg daily from October 2015 to May 2020, but due to elevation of the liver transaminases was switched to anastrazole ? ?Hepatic steatosis determined by biopsy of liver ?Severe hepatic steatosis with elevation of the liver transaminases, nearly normalized now.  She continues to follow with hepatology, Dr. Beverley Fiedler every 6 months who has recommended annual ultrasounds of the liver. ? ?Bone metastases ([place]) ?New bone metastases, November 2022, with multiple marrow replacing lesions within the proximal right femur within the ischium as well as the inferior rami. 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 Ki67 was 60%. PET imaging from December 19th revealed dominant finding of intensely hypermetabolic aggressive skeletal metastasis involving the calvarium, sternum, thoracic and lumbar spine, and pelvis, with potential pathologic fracture of the right femur with internal fixation. There is soft tissue extension into the anterior mediastinum associated with the manubrial metastasis. Multiple spinal vertebral body lesions have cortical destruction along the posterior margin. She started monthly Xgeva in December, and recently completed radiation therapy. She is now  receiving THP and will plan for hormonal therapy. She has completed 2 cycles of treatment. This last treatment was rough on her. We brought her in for IVF and antiemetics. Today, she feels much better, but we will plan on a 20% dose reduction for her upcoming cycles. She will return to clinic in one week for repeat evaluation.   ? ?The patient understands the plans discussed today and is in agreement with them.  She knows to contact our office if she develops concerns prior to her next appointment. ? ? ? ?Melodye Ped, NP  ?[facility] [address] ?[place] [address] ?Dept: [phone] ?Dept Fax: [phone]  ? ?No orders of the defined types were placed in this encounter. ?  ? ? ?CHIEF COMPLAINT:  ?CC: A 51 year old female with metastatic breast cancer here for 1 week evaluation ? ?Current Treatment:  THP ? ?INTERVAL HISTORY:  ?Anna [name] is here today for repeat clinical assessment. She denies fevers or chills. She denies pain. Her appetite is good. Her weight has been stable. ? ?I have reviewed the past medical history, past surgical history, social history and family history with the patient and they are unchanged from previous note. ? ?ALLERGIES:  has No Known Allergies. ? ?MEDICATIONS:  ?Current Outpatient Medications  ?Medication Sig Dispense Refill  ? albuterol (VENTOLIN HFA) 108 (90 Base) MCG/ACT inhaler Inhale 2 puffs into the lungs every 6 (six) hours as needed for shortness of breath.    ? ARIPiprazole (ABILIFY) 5 MG tablet Take 2.5 mg by mouth daily.    ? aspirin 81 MG EC tablet Take 81 mg by mouth daily. Swallow whole.    ? atorvastatin (LIPITOR) 20 MG tablet Take 1 tablet (20 mg total) by  mouth daily. 90 tablet 3  ? calcium carbonate (OS-CAL) 1250 (500 Ca) MG chewable tablet Chew 6 tablets by mouth daily.    ? celecoxib (CELEBREX) 200 MG capsule Take 1 capsule (200 mg total) by mouth 2 (two) times daily. To start this after  finishing Xarelto 60 capsule 0  ? desvenlafaxine (PRISTIQ) 50 MG 24 hr tablet Take 50 mg by mouth daily.    ? dexamethasone (DECADRON) 4 MG tablet Take 2 tabs twice daily beginning the day before chemotherapy and for 2 days after, every 3 weeks, by mouth 60 tablet 0  ? diphenoxylate-atropine (LOMOTIL) 2.5-0.025 MG tablet Take 2 tablets by mouth 4 (four) times daily as needed for diarrhea or loose stools. 30 tablet 0  ? FARXIGA 10 MG TABS tablet Take 1 tablet (10 mg total) by mouth daily. 90 tablet 3  ? fluconazole (DIFLUCAN) 150 MG tablet Take 1 tablet (150 mg total) by mouth daily. (Patient not taking: Reported on 10/20/2021) 2 tablet 3  ? fluticasone (FLONASE) 50 MCG/ACT nasal spray Place 1 spray into both nostrils daily.    ? labetalol (NORMODYNE) 200 MG tablet Take 1 tablet (200 mg total) by mouth 2 (two) times daily. 180 tablet 3  ? Loratadine 10 MG CAPS Take 1 capsule (10 mg total) by mouth daily. 30 capsule 6  ? LORazepam (ATIVAN) 1 MG tablet Take 1 tablet (1 mg total) by mouth every 8 (eight) hours. 30 tablet 0  ? magic mouthwash (lidocaine, diphenhydrAMINE, alum & mag hydroxide) suspension Take 5 mLs by mouth every 3 (three) hours as needed. Swish and spit; for throat/mouth pain    ? morphine (MS CONTIN) 15 MG 12 hr tablet Take 1 tablet (15 mg total) by mouth every 12 (twelve) hours. 60 tablet 0  ? mupirocin nasal ointment (BACTROBAN NASAL) 2 % Place 1 application into the nose 2 (two) times daily. Use one-half of tube in each nostril twice daily for five (5) days. After application, press sides of nose together and gently massage. 10 g 0  ? nitroGLYCERIN (NITROSTAT) 0.4 MG SL tablet Place 0.4 mg under the tongue every 5 (five) minutes as needed for chest pain.    ? nystatin (MYCOSTATIN) 100000 UNIT/ML suspension Take 5 mLs (500,000 Units total) by mouth 4 (four) times daily. 473 mL 0  ? omeprazole (PRILOSEC) 40 MG capsule Take 1 capsule (40 mg total) by mouth daily. 30 capsule 5  ? ondansetron (ZOFRAN) 4 MG  tablet Take 1 tablet (4 mg total) by mouth every 4 (four) hours as needed for nausea. 90 tablet 3  ? Oxycodone HCl 10 MG TABS Take 1 tablet (10 mg total) by mouth every 4 (four) hours as needed. 120 tablet 0  ? OZEMPIC, 0.25 OR 0.5 MG/DOSE, 2 MG/1.5ML SOPN Inject into the skin.    ? potassium chloride SA (KLOR-CON M) 20 MEQ tablet Take 1 tablet (20 mEq total) by mouth 2 (two) times daily. 60 tablet 0  ? prochlorperazine (COMPAZINE) 10 MG tablet Take 1 tablet (10 mg total) by mouth every 6 (six) hours as needed for nausea or vomiting. 90 tablet 3  ? topiramate (TOPAMAX) 50 MG tablet Take 50 mg by mouth daily.    ? valsartan (DIOVAN) 40 MG tablet Take 1 tablet (40 mg total) by mouth daily. 90 tablet 3  ? Vitamin D, Ergocalciferol, (DRISDOL) 1.25 MG (50000 UNIT) CAPS capsule Take 50,000 Units by mouth once a week.    ? ?Current Facility-Administered Medications  ?Medication Dose Route  Frequency Provider Last Rate Last Admin  ? 0.9 %  sodium chloride infusion  500 mL Intravenous Once Jackquline Denmark, MD      ? technetium sestamibi generic (CARDIOLITE) injection 19.9 millicurie  14.4 millicurie Intravenous Once PRN Revankar, Reita Cliche, MD      ? ? ?HISTORY OF PRESENT ILLNESS:  ? ?Oncology History  ?Malignant neoplasm of upper-outer quadrant of left female breast ([place])  ?07/23/2013 Initial Diagnosis  ? Malignant neoplasm of upper-outer quadrant of left female breast [facility]) ?  ?07/23/2013 Cancer Staging  ? Staging form: Breast, AJCC 7th Edition ?- Clinical stage from 07/23/2013: Stage IIB (T2, N1, M0) - Signed by Derwood Kaplan, MD on 07/04/2020 ?Prognostic indicators: Pos LVI ? ?  ?09/22/2021 -  Chemotherapy  ? Patient is on Treatment Plan : BREAST DOCEtaxel + Trastuzumab + Pertuzumab (THP) q21d x 8 cycles / Trastuzumab + Pertuzumab q21d x 4 cycles  ?   ?Bone metastases ([place])  ?06/28/2021 Initial Diagnosis  ? Bone metastases ([place]) ?  ?09/22/2021 -  Chemotherapy  ? Patient is on Treatment Plan : BREAST DOCEtaxel +  Trastuzumab + Pertuzumab (THP) q21d x 8 cycles / Trastuzumab + Pertuzumab q21d x 4 cycles  ?   ?  ? ? ?REVIEW OF SYSTEMS:  ? ?Constitutional: Denies fevers, chills or abnormal weight loss ?Eyes: Denies blurrines

## 2021-10-27 NOTE — Assessment & Plan Note (Signed)
Severe hepatic steatosis with elevation of the liver transaminases, nearly normalized now. ?She continues to follow with hepatology, Dr. Beverley Fiedler every 6 months who has recommended annual ultrasounds of the liver. ?

## 2021-10-30 ENCOUNTER — Other Ambulatory Visit: Payer: Self-pay | Admitting: Hematology and Oncology

## 2021-10-30 DIAGNOSIS — K521 Toxic gastroenteritis and colitis: Secondary | ICD-10-CM

## 2021-11-01 ENCOUNTER — Other Ambulatory Visit: Payer: Self-pay | Admitting: Hematology and Oncology

## 2021-11-01 ENCOUNTER — Inpatient Hospital Stay (INDEPENDENT_AMBULATORY_CARE_PROVIDER_SITE_OTHER): Payer: BC Managed Care – PPO | Admitting: Hematology and Oncology

## 2021-11-01 ENCOUNTER — Other Ambulatory Visit: Payer: Self-pay

## 2021-11-01 ENCOUNTER — Inpatient Hospital Stay: Payer: BC Managed Care – PPO

## 2021-11-01 ENCOUNTER — Other Ambulatory Visit: Payer: Self-pay | Admitting: Pharmacist

## 2021-11-01 ENCOUNTER — Encounter: Payer: Self-pay | Admitting: Hematology and Oncology

## 2021-11-01 DIAGNOSIS — C7951 Secondary malignant neoplasm of bone: Secondary | ICD-10-CM

## 2021-11-01 DIAGNOSIS — Z5111 Encounter for antineoplastic chemotherapy: Secondary | ICD-10-CM | POA: Diagnosis not present

## 2021-11-01 DIAGNOSIS — C50412 Malignant neoplasm of upper-outer quadrant of left female breast: Secondary | ICD-10-CM

## 2021-11-01 LAB — HEPATIC FUNCTION PANEL
ALT: 19 U/L (ref 7–35)
AST: 25 (ref 13–35)
Alkaline Phosphatase: 148 — AB (ref 25–125)
Bilirubin, Total: 0.5

## 2021-11-01 LAB — HEMOGLOBIN A1C
Hgb A1c MFr Bld: 6.2 % — ABNORMAL HIGH (ref 4.8–5.6)
Mean Plasma Glucose: 131.24 mg/dL

## 2021-11-01 LAB — BASIC METABOLIC PANEL
BUN: 12 (ref 4–21)
CO2: 26 — AB (ref 13–22)
Chloride: 109 — AB (ref 99–108)
Creatinine: 0.6 (ref 0.5–1.1)
Glucose: 149
Potassium: 3.8 mEq/L (ref 3.5–5.1)
Sodium: 140 (ref 137–147)

## 2021-11-01 LAB — COMPREHENSIVE METABOLIC PANEL
Albumin: 3.6 (ref 3.5–5.0)
Calcium: 8.6 — AB (ref 8.7–10.7)

## 2021-11-01 LAB — CBC AND DIFFERENTIAL
HCT: 32 — AB (ref 36–46)
Hemoglobin: 10.5 — AB (ref 12.0–16.0)
Neutrophils Absolute: 5.91
Platelets: 333 10*3/uL (ref 150–400)
WBC: 7.3

## 2021-11-01 LAB — MAGNESIUM: Magnesium: 1.9

## 2021-11-01 LAB — CBC: RBC: 4.18 (ref 3.87–5.11)

## 2021-11-01 NOTE — Progress Notes (Signed)
?Patient Care Team: ?Bonnita Nasuti, MD as PCP - General (Internal Medicine) ?Berniece Salines, DO as PCP - Cardiology (Cardiology) ?Derwood Kaplan, MD as Consulting Physician (Oncology) ?Gatha Mayer, MD as Consulting Physician (Radiation Oncology) ?Laurell Roof, RN as Registered Nurse ? ?Clinic Day:  11/01/2021 ? ?Referring physician: Bonnita Nasuti, MD ? ?ASSESSMENT & PLAN:  ? ?Assessment & Plan: ?Bone metastases ([place]) ?New bone metastases, November 2022, with multiple marrow replacing lesions within the proximal right femur within the ischium as well as the inferior rami. 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 Ki67 was 60%. PET imaging from December 19th revealed dominant finding of intensely hypermetabolic aggressive skeletal metastasis involving the calvarium, sternum, thoracic and lumbar spine, and pelvis, with potential pathologic fracture of the right femur with internal fixation. There is soft tissue extension into the anterior mediastinum associated with the manubrial metastasis. Multiple spinal vertebral body lesions have cortical destruction along the posterior margin. She started monthly Xgeva in December, and recently completed radiation therapy. She is now receiving THP and will plan for hormonal therapy. She has completed 2 cycles of treatment. This last treatment was rough on her. We brought her in for IVF and antiemetics. Today, she feels much better, but we will plan on a 20% dose reduction for her upcoming cycles. She will return to clinic in one week for repeat evaluation.  ?  ? ?The patient understands the plans discussed today and is in agreement with them.  She knows to contact our office if she develops concerns prior to her next appointment. ? ? ?Melodye Ped, NP  ?[place] [address] ?[place] [address] ?Dept:  [phone] ?Dept Fax: [phone]  ? ?No orders of the defined types were placed in this encounter. ?  ? ? ?CHIEF COMPLAINT:  ?CC: A 51 year old female with history of breast cancer here for 1 week evaluation ? ?Current Treatment:  Docetaxel, Pertuzumab, Trastuzumab ? ?INTERVAL HISTORY:  ?Anna [name] is here today for repeat clinical assessment. She denies fevers or chills. She denies pain. Her appetite is good. Her weight has been stable. ? ?I have reviewed the past medical history, past surgical history, social history and family history with the patient and they are unchanged from previous note. ? ?ALLERGIES:  has No Known Allergies. ? ?MEDICATIONS:  ?Current Outpatient Medications  ?Medication Sig Dispense Refill  ? albuterol (VENTOLIN HFA) 108 (90 Base) MCG/ACT inhaler Inhale 2 puffs into the lungs every 6 (six) hours as needed for shortness of breath.    ? ARIPiprazole (ABILIFY) 5 MG tablet Take 2.5 mg by mouth daily.    ? aspirin 81 MG EC tablet Take 81 mg by mouth daily. Swallow whole.    ? atorvastatin (LIPITOR) 20 MG tablet Take 1 tablet (20 mg total) by mouth daily. 90 tablet 3  ? calcium carbonate (OS-CAL) 1250 (500 Ca) MG chewable tablet Chew 6 tablets by mouth daily.    ? celecoxib (CELEBREX) 200 MG capsule Take 1 capsule (200 mg total) by mouth 2 (two) times daily. To start this after finishing Xarelto 60 capsule 0  ? desvenlafaxine (PRISTIQ) 50 MG 24 hr tablet Take 50 mg by mouth daily.    ? dexamethasone (DECADRON) 4 MG tablet Take 2 tabs twice daily beginning the day before chemotherapy and for 2 days after, every 3 weeks, by mouth 60  tablet 0  ? diphenoxylate-atropine (LOMOTIL) 2.5-0.025 MG tablet TAKE TWO TABLETS BY MOUTH FOUR TIMES A DAY AS NEEDED FOR DIARRHEA OR LOOSE STOOLS 100 tablet 1  ? FARXIGA 10 MG TABS tablet Take 1 tablet (10 mg total) by mouth daily. 90 tablet 3  ? fluconazole (DIFLUCAN) 150 MG tablet Take 1 tablet (150 mg total) by mouth daily. (Patient not taking: Reported on  10/20/2021) 2 tablet 3  ? fluticasone (FLONASE) 50 MCG/ACT nasal spray Place 1 spray into both nostrils daily.    ? labetalol (NORMODYNE) 200 MG tablet Take 1 tablet (200 mg total) by mouth 2 (two) times daily. 180 tablet 3  ? Loratadine 10 MG CAPS Take 1 capsule (10 mg total) by mouth daily. 30 capsule 6  ? LORazepam (ATIVAN) 1 MG tablet Take 1 tablet (1 mg total) by mouth every 8 (eight) hours. 30 tablet 0  ? magic mouthwash (lidocaine, diphenhydrAMINE, alum & mag hydroxide) suspension Take 5 mLs by mouth every 3 (three) hours as needed. Swish and spit; for throat/mouth pain    ? morphine (MS CONTIN) 15 MG 12 hr tablet Take 1 tablet (15 mg total) by mouth every 12 (twelve) hours. 60 tablet 0  ? mupirocin nasal ointment (BACTROBAN NASAL) 2 % Place 1 application into the nose 2 (two) times daily. Use one-half of tube in each nostril twice daily for five (5) days. After application, press sides of nose together and gently massage. 10 g 0  ? nitroGLYCERIN (NITROSTAT) 0.4 MG SL tablet Place 0.4 mg under the tongue every 5 (five) minutes as needed for chest pain.    ? nystatin (MYCOSTATIN) 100000 UNIT/ML suspension Take 5 mLs (500,000 Units total) by mouth 4 (four) times daily. 473 mL 0  ? omeprazole (PRILOSEC) 40 MG capsule Take 1 capsule (40 mg total) by mouth daily. 30 capsule 5  ? ondansetron (ZOFRAN) 4 MG tablet Take 1 tablet (4 mg total) by mouth every 4 (four) hours as needed for nausea. 90 tablet 3  ? Oxycodone HCl 10 MG TABS Take 1 tablet (10 mg total) by mouth every 4 (four) hours as needed. 120 tablet 0  ? OZEMPIC, 0.25 OR 0.5 MG/DOSE, 2 MG/1.5ML SOPN Inject into the skin.    ? potassium chloride SA (KLOR-CON M) 20 MEQ tablet Take 1 tablet (20 mEq total) by mouth 2 (two) times daily. 60 tablet 0  ? prochlorperazine (COMPAZINE) 10 MG tablet Take 1 tablet (10 mg total) by mouth every 6 (six) hours as needed for nausea or vomiting. 90 tablet 3  ? topiramate (TOPAMAX) 50 MG tablet Take 50 mg by mouth daily.    ?  valsartan (DIOVAN) 40 MG tablet Take 1 tablet (40 mg total) by mouth daily. 90 tablet 3  ? Vitamin D, Ergocalciferol, (DRISDOL) 1.25 MG (50000 UNIT) CAPS capsule Take 50,000 Units by mouth once a week.    ? ?Current Facility-Administered Medications  ?Medication Dose Route Frequency Provider Last Rate Last Admin  ? 0.9 %  sodium chloride infusion  500 mL Intravenous Once Jackquline Denmark, MD      ? technetium sestamibi generic (CARDIOLITE) injection 03.0 millicurie  09.2 millicurie Intravenous Once PRN Revankar, Reita Cliche, MD      ? ? ?HISTORY OF PRESENT ILLNESS:  ? 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 Malignant neoplasm of upper-outer quadrant of left female breast [facility]) ?  ?07/23/2013 Cancer Staging  ? Staging form: Breast,  AJCC 7th Edition ?- Clinical stage from 07/23/2013: Stage IIB (T2, N1, M0) - Signed by Derwood Kaplan, MD on 07/04/2020 ?Prognostic indicators: Pos LVI ? ?  ?09/22/2021 -  Chemotherapy  ? Patient is on Treatment Plan : BREAST DOCEtaxel + Trastuzumab + Pertuzumab (THP) q21d x 8 cycles / Trastuzumab + Pertuzumab q21d x 4 cycles  ?   ?Bone metastases ([place])  ?06/28/2021 Initial Diagnosis  ? Bone metastases ([place]) ?  ?09/22/2021 -  Chemotherapy  ? Patient is on Treatment Plan : BREAST DOCEtaxel + Trastuzumab + Pertuzumab (THP) q21d x 8 cycles / Trastuzumab + Pertuzumab q21d x 4 cycles  ?   ?  ? ? ?REVIEW OF SYSTEMS:  ? ?Constitutional: Denies fevers, chills or abnormal weight loss ?Eyes: Denies blurriness of vision ?Ears, nose, mouth, throat, and face: Denies mucositis or sore throat ?Respiratory: Denies cough, dyspnea or wheezes ?Cardiovascular: Denies palpitation, chest discomfort or lower extremity swelling ?Gastrointestinal:  Denies nausea, heartburn or change in bowel habits ?Skin: Denies abnormal skin rashes ?Lymphatics: Denies new lymphadenopathy or easy bruising ?Neurological:Denies numbness, tingling or  new weaknesses ?Behavioral/Psych: Mood is stable, no new changes  ?All other systems were reviewed with the patient and are negative. ? ? ?VITALS:  ?Blood pressure 111/64, pulse (!) 110, temperature 98.2 ?F (36.8

## 2021-11-01 NOTE — Assessment & Plan Note (Signed)
New bone metastases, November 2022, with multiple marrow replacing lesions within the proximal right femur within the ischium as well as the inferior rami. 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 Ki67 was 60%. PET imaging from December 19th revealed dominant finding of intensely hypermetabolic aggressive skeletal metastasis involving the calvarium, sternum, thoracic and lumbar spine, and pelvis, with potential pathologic fracture of the right femur with internal fixation. There is soft tissue extension into the anterior mediastinum associated with the manubrial metastasis. Multiple spinal vertebral body lesions have cortical destruction along the posterior margin.?She started monthly Xgeva in December, and recently completed radiation therapy. She is now receiving THP and will plan for hormonal therapy. She has completed 2 cycles of treatment. This last treatment was rough on her. We brought her in for IVF and antiemetics. Today, she feels much better, but we will plan on a 20% dose reduction for her upcoming cycles. She will return to clinic in one week for repeat evaluation.  ?

## 2021-11-03 ENCOUNTER — Encounter: Payer: Self-pay | Admitting: Oncology

## 2021-11-03 ENCOUNTER — Inpatient Hospital Stay: Payer: BC Managed Care – PPO

## 2021-11-03 ENCOUNTER — Other Ambulatory Visit: Payer: Self-pay | Admitting: Pharmacist

## 2021-11-03 ENCOUNTER — Ambulatory Visit: Payer: BC Managed Care – PPO

## 2021-11-03 VITALS — BP 130/76 | HR 119 | Temp 98.2°F | Resp 18 | Wt 162.2 lb

## 2021-11-03 DIAGNOSIS — C7951 Secondary malignant neoplasm of bone: Secondary | ICD-10-CM

## 2021-11-03 DIAGNOSIS — C50412 Malignant neoplasm of upper-outer quadrant of left female breast: Secondary | ICD-10-CM

## 2021-11-03 DIAGNOSIS — Z5111 Encounter for antineoplastic chemotherapy: Secondary | ICD-10-CM | POA: Diagnosis not present

## 2021-11-03 MED ORDER — PALONOSETRON HCL INJECTION 0.25 MG/5ML
0.2500 mg | Freq: Once | INTRAVENOUS | Status: AC
  Administered 2021-11-03: 0.25 mg via INTRAVENOUS
  Filled 2021-11-03: qty 5

## 2021-11-03 MED ORDER — TRASTUZUMAB-DKST CHEMO 150 MG IV SOLR
6.0000 mg/kg | Freq: Once | INTRAVENOUS | Status: AC
  Administered 2021-11-03: 462 mg via INTRAVENOUS
  Filled 2021-11-03: qty 22

## 2021-11-03 MED ORDER — HEPARIN SOD (PORK) LOCK FLUSH 100 UNIT/ML IV SOLN
500.0000 [IU] | Freq: Once | INTRAVENOUS | Status: AC | PRN
  Administered 2021-11-03: 500 [IU]

## 2021-11-03 MED ORDER — SODIUM CHLORIDE 0.9 % IV SOLN
10.0000 mg | Freq: Once | INTRAVENOUS | Status: AC
  Administered 2021-11-03: 10 mg via INTRAVENOUS
  Filled 2021-11-03: qty 10

## 2021-11-03 MED ORDER — DENOSUMAB 120 MG/1.7ML ~~LOC~~ SOLN
120.0000 mg | Freq: Once | SUBCUTANEOUS | Status: AC
  Administered 2021-11-03: 120 mg via SUBCUTANEOUS
  Filled 2021-11-03: qty 1.7

## 2021-11-03 MED ORDER — SODIUM CHLORIDE 0.9 % IV SOLN
60.0000 mg/m2 | Freq: Once | INTRAVENOUS | Status: AC
  Administered 2021-11-03: 110 mg via INTRAVENOUS
  Filled 2021-11-03: qty 11

## 2021-11-03 MED ORDER — ACETAMINOPHEN 325 MG PO TABS
650.0000 mg | ORAL_TABLET | Freq: Once | ORAL | Status: AC
  Administered 2021-11-03: 650 mg via ORAL
  Filled 2021-11-03: qty 2

## 2021-11-03 MED ORDER — SODIUM CHLORIDE 0.9 % IV SOLN
420.0000 mg | Freq: Once | INTRAVENOUS | Status: AC
  Administered 2021-11-03: 420 mg via INTRAVENOUS
  Filled 2021-11-03: qty 14

## 2021-11-03 MED ORDER — SODIUM CHLORIDE 0.9% FLUSH
10.0000 mL | INTRAVENOUS | Status: DC | PRN
  Administered 2021-11-03: 10 mL

## 2021-11-03 MED ORDER — SODIUM CHLORIDE 0.9 % IV SOLN: Freq: Once | INTRAVENOUS | Status: AC

## 2021-11-03 MED ORDER — DIPHENHYDRAMINE HCL 25 MG PO CAPS
50.0000 mg | ORAL_CAPSULE | Freq: Once | ORAL | Status: AC
  Administered 2021-11-03: 50 mg via ORAL
  Filled 2021-11-03: qty 2

## 2021-11-03 NOTE — Patient Instructions (Signed)
[facility]  Discharge Instructions: ?Thank you for choosing [facility] to provide your oncology and hematology care.  ?If you have a lab appointment with the [facility], please go directly to the [facility] and check in at the registration area. ?  ?Wear comfortable clothing and clothing appropriate for easy access to any Portacath or PICC line.  ? ?We strive to give you quality time with your provider. You may need to reschedule your appointment if you arrive late (15 or more minutes).  Arriving late affects you and other patients whose appointments are after yours.  Also, if you miss three or more appointments without notifying the office, you may be dismissed from the clinic at the provider?s discretion.    ?  ?For prescription refill requests, have your pharmacy contact our office and allow 72 hours for refills to be completed.   ? ?Today you received the following chemotherapy and/or immunotherapy agents Pertuzumab,Trastuzumab,DocetaxelDenosumab injection ?What is this medication? ?DENOSUMAB (den oh sue mab) slows bone breakdown. Prolia is used to treat osteoporosis in women after menopause and in men, and in people who are taking corticosteroids for 6 months or more. Delton See is used to treat a high calcium level due to cancer and to prevent bone fractures and other bone problems caused by multiple myeloma or cancer bone metastases. Delton See is also used to treat giant cell tumor of the bone. ?This medicine may be used for other purposes; ask your health care provider or pharmacist if you have questions. ?COMMON BRAND NAME(S): Prolia, XGEVA ?What should I tell my care team before I take this medication? ?They need to know if you have any of these conditions: ?dental disease ?having surgery or tooth extraction ?infection ?kidney disease ?low levels of calcium or Vitamin D in the blood ?malnutrition ?on hemodialysis ?skin conditions or sensitivity ?thyroid or parathyroid  disease ?an unusual reaction to denosumab, other medicines, foods, dyes, or preservatives ?pregnant or trying to get pregnant ?breast-feeding ?How should I use this medication? ?This medicine is for injection under the skin. It is given by a health care professional in a hospital or clinic setting. ?A special MedGuide will be given to you before each treatment. Be sure to read this information carefully each time. ?For Prolia, talk to your pediatrician regarding the use of this medicine in children. Special care may be needed. For Delton See, talk to your pediatrician regarding the use of this medicine in children. While this drug may be prescribed for children as young as 13 years for selected conditions, precautions do apply. ?Overdosage: If you think you have taken too much of this medicine contact a poison control center or emergency room at once. ?NOTE: This medicine is only for you. Do not share this medicine with others. ?What if I miss a dose? ?It is important not to miss your dose. Call your doctor or health care professional if you are unable to keep an appointment. ?What may interact with this medication? ?Do not take this medicine with any of the following medications: ?other medicines containing denosumab ?This medicine may also interact with the following medications: ?medicines that lower your chance of fighting infection ?steroid medicines like prednisone or cortisone ?This list may not describe all possible interactions. Give your health care provider a list of all the medicines, herbs, non-prescription drugs, or dietary supplements you use. Also tell them if you smoke, drink alcohol, or use illegal drugs. Some items may interact with your medicine. ?What should  I watch for while using this medication? ?Visit your doctor or health care professional for regular checks on your progress. Your doctor or health care professional may order blood tests and other tests to see how you are doing. ?Call your doctor  or health care professional for advice if you get a fever, chills or sore throat, or other symptoms of a cold or flu. Do not treat yourself. This drug may decrease your body's ability to fight infection. Try to avoid being around people who are sick. ?You should make sure you get enough calcium and vitamin D while you are taking this medicine, unless your doctor tells you not to. Discuss the foods you eat and the vitamins you take with your health care professional. ?See your dentist regularly. Brush and floss your teeth as directed. Before you have any dental work done, tell your dentist you are receiving this medicine. ?Do not become pregnant while taking this medicine or for 5 months after stopping it. Talk with your doctor or health care professional about your birth control options while taking this medicine. Women should inform their doctor if they wish to become pregnant or think they might be pregnant. There is a potential for serious side effects to an unborn child. Talk to your health care professional or pharmacist for more information. ?What side effects may I notice from receiving this medication? ?Side effects that you should report to your doctor or health care professional as soon as possible: ?allergic reactions like skin rash, itching or hives, swelling of the face, lips, or tongue ?bone pain ?breathing problems ?dizziness ?jaw pain, especially after dental work ?redness, blistering, peeling of the skin ?signs and symptoms of infection like fever or chills; cough; sore throat; pain or trouble passing urine ?signs of low calcium like fast heartbeat, muscle cramps or muscle pain; pain, tingling, numbness in the hands or feet; seizures ?unusual bleeding or bruising ?unusually weak or tired ?Side effects that usually do not require medical attention (report to your doctor or health care professional if they continue or are bothersome): ?constipation ?diarrhea ?headache ?joint pain ?loss of  appetite ?muscle pain ?runny nose ?tiredness ?upset stomach ?This list may not describe all possible side effects. Call your doctor for medical advice about side effects. You may report side effects to FDA at 1-800-FDA-1088. ?Where should I keep my medication? ?This medicine is only given in a clinic, doctor's office, or other health care setting and will not be stored at home. ?NOTE: This sheet is a summary. It may not cover all possible information. If you have questions about this medicine, talk to your doctor, pharmacist, or health care provider. ?? 2022 Elsevier/Gold Standard (2017-12-06 00:00:00) ?   ?  ?To help prevent nausea and vomiting after your treatment, we encourage you to take your nausea medication as directed. ? ?BELOW ARE SYMPTOMS THAT SHOULD BE REPORTED IMMEDIATELY: ?*FEVER GREATER THAN 100.4 F (38 ?C) OR HIGHER ?*CHILLS OR SWEATING ?*NAUSEA AND VOMITING THAT IS NOT CONTROLLED WITH YOUR NAUSEA MEDICATION ?*UNUSUAL SHORTNESS OF BREATH ?*UNUSUAL BRUISING OR BLEEDING ?*URINARY PROBLEMS (pain or burning when urinating, or frequent urination) ?*BOWEL PROBLEMS (unusual diarrhea, constipation, pain near the anus) ?TENDERNESS IN MOUTH AND THROAT WITH OR WITHOUT PRESENCE OF ULCERS (sore throat, sores in mouth, or a toothache) ?UNUSUAL RASH, SWELLING OR PAIN  ?UNUSUAL VAGINAL DISCHARGE OR ITCHING  ? ?Items with * indicate a potential emergency and should be followed up as soon as possible or go to the Emergency Department if any problems  should occur. ? ?Please show the CHEMOTHERAPY ALERT CARD or IMMUNOTHERAPY ALERT CARD at check-in to the Emergency Department and triage nurse. ? ?Should you have questions after your visit or need to cancel or reschedule your appointment, please contact [facility]  Dept: [phone]  and follow the prompts.  Office hours are 8:00 a.m. to 4:30 p.m. Monday - Friday. Please note that voicemails left after 4:00 p.m. may not be returned until the  following business day.  We are closed weekends and major holidays. You have access to a nurse at all times for urgent questions. Please call the main number to the clinic Dept: [phone] and follow the prompts.

## 2021-11-03 NOTE — Progress Notes (Signed)
1301: PT STABLE AT TIME OF DISCHARGE ?

## 2021-11-03 NOTE — Progress Notes (Unsigned)
Spoke with [name] @[employer]  [phone] confirmed no PA required for CPT 93306 ECHO ?REF# 11/03/2021 Anna [name] ?

## 2021-11-06 ENCOUNTER — Inpatient Hospital Stay: Payer: BC Managed Care – PPO

## 2021-11-06 ENCOUNTER — Other Ambulatory Visit: Payer: Self-pay

## 2021-11-06 VITALS — BP 136/67 | HR 91 | Temp 97.7°F | Resp 18 | Wt 162.1 lb

## 2021-11-06 DIAGNOSIS — Z170 Estrogen receptor positive status [ER+]: Secondary | ICD-10-CM

## 2021-11-06 DIAGNOSIS — C7951 Secondary malignant neoplasm of bone: Secondary | ICD-10-CM

## 2021-11-06 DIAGNOSIS — Z5111 Encounter for antineoplastic chemotherapy: Secondary | ICD-10-CM | POA: Diagnosis not present

## 2021-11-06 MED ORDER — PEGFILGRASTIM-CBQV 6 MG/0.6ML ~~LOC~~ SOSY
6.0000 mg | PREFILLED_SYRINGE | Freq: Once | SUBCUTANEOUS | Status: AC
  Administered 2021-11-06: 6 mg via SUBCUTANEOUS
  Filled 2021-11-06: qty 0.6

## 2021-11-06 NOTE — Patient Instructions (Signed)

## 2021-11-08 ENCOUNTER — Encounter: Payer: Self-pay | Admitting: Oncology

## 2021-11-12 ENCOUNTER — Encounter: Payer: Self-pay | Admitting: Oncology

## 2021-11-14 ENCOUNTER — Encounter: Payer: Self-pay | Admitting: Oncology

## 2021-11-15 ENCOUNTER — Other Ambulatory Visit: Payer: Self-pay | Admitting: Hematology and Oncology

## 2021-11-15 ENCOUNTER — Telehealth: Payer: Self-pay

## 2021-11-15 DIAGNOSIS — C7951 Secondary malignant neoplasm of bone: Secondary | ICD-10-CM

## 2021-11-15 DIAGNOSIS — M545 Low back pain, unspecified: Secondary | ICD-10-CM

## 2021-11-15 DIAGNOSIS — R0789 Other chest pain: Secondary | ICD-10-CM

## 2021-11-15 MED ORDER — OXYCODONE HCL 10 MG PO TABS: 10.0000 mg | ORAL_TABLET | ORAL | 0 refills | Status: DC | PRN

## 2021-11-15 NOTE — Telephone Encounter (Signed)
-----   Message from Melodye Ped, NP sent at 11/15/2021  2:33 PM EDT ----- ?Regarding: RE: oxycodone ?Sent ?----- Message ----- ?From: Georgette Shell, RN ?Sent: 11/15/2021   2:17 PM EDT ?To: Melodye Ped, NP ?Subject: oxycodone                                     ? ?Send Oxycodone 31m to [facility] ? ? ?

## 2021-11-16 ENCOUNTER — Encounter: Payer: Self-pay | Admitting: Oncology

## 2021-11-17 ENCOUNTER — Inpatient Hospital Stay: Payer: BC Managed Care – PPO | Attending: Oncology | Admitting: Hematology and Oncology

## 2021-11-17 ENCOUNTER — Inpatient Hospital Stay: Payer: BC Managed Care – PPO

## 2021-11-17 ENCOUNTER — Encounter: Payer: Self-pay | Admitting: Hematology and Oncology

## 2021-11-17 VITALS — BP 120/77 | HR 116 | Temp 98.6°F | Resp 18 | Ht 59.0 in | Wt 162.0 lb

## 2021-11-17 DIAGNOSIS — Z5189 Encounter for other specified aftercare: Secondary | ICD-10-CM | POA: Insufficient documentation

## 2021-11-17 DIAGNOSIS — Z170 Estrogen receptor positive status [ER+]: Secondary | ICD-10-CM | POA: Insufficient documentation

## 2021-11-17 DIAGNOSIS — Z5112 Encounter for antineoplastic immunotherapy: Secondary | ICD-10-CM | POA: Insufficient documentation

## 2021-11-17 DIAGNOSIS — S81802A Unspecified open wound, left lower leg, initial encounter: Secondary | ICD-10-CM | POA: Insufficient documentation

## 2021-11-17 DIAGNOSIS — C7951 Secondary malignant neoplasm of bone: Secondary | ICD-10-CM | POA: Diagnosis not present

## 2021-11-17 DIAGNOSIS — C50412 Malignant neoplasm of upper-outer quadrant of left female breast: Secondary | ICD-10-CM | POA: Diagnosis not present

## 2021-11-17 DIAGNOSIS — Z5111 Encounter for antineoplastic chemotherapy: Secondary | ICD-10-CM | POA: Insufficient documentation

## 2021-11-17 NOTE — Progress Notes (Addendum)
?Patient Care Team: ?Bonnita Nasuti, MD as PCP - General (Internal Medicine) ?Berniece Salines, DO as PCP - Cardiology (Cardiology) ?Derwood Kaplan, MD as Consulting Physician (Oncology) ?Gatha Mayer, MD as Consulting Physician (Radiation Oncology) ?Laurell Roof, RN as Registered Nurse ? ?Clinic Day:  11/27/2021 ? ?Referring physician: Bonnita Nasuti, MD ? ?ASSESSMENT & PLAN:  ? 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 She was on adjuvant hormonal therapy with tamoxifen 20 mg daily from October 2015 to May 2020, but due to elevation of the liver transaminases was switched to anastrazole ? ?Malignant neoplasm metastatic to bone [facility]) ?New bone metastases, November 2022, with multiple marrow replacing lesions within the proximal right femur within the ischium as well as the inferior rami. 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 Ki67 was 60%. PET imaging from December 19th revealed dominant finding of intensely hypermetabolic aggressive skeletal metastasis involving the calvarium, sternum, thoracic and lumbar spine, and pelvis, with potential pathologic fracture of the right femur with internal fixation. There is soft tissue extension into the anterior mediastinum associated with the manubrial metastasis. Multiple spinal vertebral body lesions have cortical destruction along the posterior margin. She started monthly Xgeva in December, and recently completed radiation therapy. She is now receiving THP and will plan for hormonal therapy. She has completed 2 cycles of treatment. This last treatment was rough on her. We brought her in for IVF and antiemetics. Today, she feels much better, but we will plan on a 20% dose reduction  for her upcoming cycles. She will return to clinic in one week for repeat evaluation.  ? ?Leg wound, left ?Large wound approximately 10 inches to left lower extremity. This is weeping and appears ulcerated. She also has a similar, but smaller wound noted to her right lower extremity. These began as small "blisters" and have progressed. She has been on Keflex without improvement. We will plan on sending her to Dr. Corena Pilgrim for evaluation with the wound clinic. She is due for treatment next Friday. Hopefully, we can have her evaluated prior to that. We will obtain culture today. She will return to clinic next week as scheduled to see Dr. Hinton Rao.   ? ?The patient understands the plans discussed today and is in agreement with them.  She knows to contact our office if she develops concerns prior to her next appointment. ? ? ? ?Melodye Ped, NP  ?[place] [address] ?[place] [address] ?Dept: [phone] ?Dept Fax: [phone]  ? ?Orders Placed This Encounter  ?Procedures  ? Miscellaneous test (send-out)  ?  Standing Status:   Future  ?  Standing Expiration Date:   11/18/2022  ?  Order Specific Question:   Test name / description:  ?  Answer:   culture and sensitity; left leg wound  ?  ? ? ?CHIEF COMPLAINT:  ?CC: A 51 year old female with history of breast cancer here for evaluation of left leg prior to chemotherapy ? ?Current Treatment:  THP ? ?INTERVAL HISTORY:  ?Anna [name] is here today for repeat clinical assessment. She denies fevers or chills. She denies pain. Her appetite is good. Her weight has been stable. ? ?I have reviewed the past medical history, past surgical  history, social history and family history with the patient and they are unchanged from previous note. ? ?ALLERGIES:  has No Known Allergies. ? ?MEDICATIONS:  ?Current Outpatient Medications  ?Medication Sig Dispense Refill  ? albuterol (VENTOLIN HFA) 108 (90 Base) MCG/ACT  inhaler Inhale 2 puffs into the lungs every 6 (six) hours as needed for shortness of breath.    ? ARIPiprazole (ABILIFY) 5 MG tablet Take 2.5 mg by mouth daily.    ? aspirin 81 MG EC tablet Take 81 mg by mouth daily. Swallow whole.    ? atorvastatin (LIPITOR) 20 MG tablet Take 1 tablet (20 mg total) by mouth daily. 90 tablet 3  ? calcium carbonate (OS-CAL) 1250 (500 Ca) MG chewable tablet Chew 6 tablets by mouth daily.    ? desvenlafaxine (PRISTIQ) 50 MG 24 hr tablet Take 50 mg by mouth daily.    ? dexamethasone (DECADRON) 4 MG tablet Take 2 tabs twice daily beginning the day before chemotherapy and for 2 days after, every 3 weeks, by mouth 60 tablet 0  ? diphenoxylate-atropine (LOMOTIL) 2.5-0.025 MG tablet TAKE TWO TABLETS BY MOUTH FOUR TIMES A DAY AS NEEDED FOR DIARRHEA OR LOOSE STOOLS 100 tablet 1  ? FARXIGA 10 MG TABS tablet Take 1 tablet (10 mg total) by mouth daily. 90 tablet 3  ? fluconazole (DIFLUCAN) 150 MG tablet Take 1 tablet (150 mg total) by mouth daily. (Patient not taking: Reported on 10/20/2021) 2 tablet 3  ? fluticasone (FLONASE) 50 MCG/ACT nasal spray Place 1 spray into both nostrils as needed.    ? labetalol (NORMODYNE) 200 MG tablet Take 1 tablet (200 mg total) by mouth 2 (two) times daily. 180 tablet 3  ? levofloxacin (LEVAQUIN) 750 MG tablet Take 1 tablet (750 mg total) by mouth daily. 7 tablet 0  ? Loratadine 10 MG CAPS Take 1 capsule (10 mg total) by mouth daily. 30 capsule 6  ? LORazepam (ATIVAN) 1 MG tablet Take 1 tablet (1 mg total) by mouth every 8 (eight) hours. (Patient taking differently: Take 1 mg by mouth every 8 (eight) hours as needed.) 30 tablet 0  ? magic mouthwash (lidocaine, diphenhydrAMINE, alum & mag hydroxide) suspension Take 5 mLs by mouth every 3 (three) hours as needed. Swish and spit; for throat/mouth pain (Patient not taking: Reported on 11/23/2021)    ? morphine (MS CONTIN) 15 MG 12 hr tablet Take 1 tablet (15 mg total) by mouth every 12 (twelve) hours. 60 tablet 0  ?  mupirocin nasal ointment (BACTROBAN NASAL) 2 % Place 1 application into the nose 2 (two) times daily. Use one-half of tube in each nostril twice daily for five (5) days. After application, press sides of nose together and gently massage. (Patient not taking: Reported on 11/23/2021) 10 g 0  ? nitroGLYCERIN (NITROSTAT) 0.4 MG SL tablet Place 0.4 mg under the tongue every 5 (five) minutes as needed for chest pain. (Patient not taking: Reported on 11/23/2021)    ? nystatin (MYCOSTATIN) 100000 UNIT/ML suspension Take 5 mLs (500,000 Units total) by mouth 4 (four) times daily. (Patient not taking: Reported on 11/23/2021) 473 mL 0  ? ondansetron (ZOFRAN) 4 MG tablet Take 1 tablet (4 mg total) by mouth every 4 (four) hours as needed for nausea. 90 tablet 3  ? Oxycodone HCl 10 MG TABS Take 1 tablet (10 mg total) by mouth every 4 (four) hours as needed. 120 tablet 0  ? OZEMPIC, 0.25 OR 0.5 MG/DOSE, 2 MG/1.5ML SOPN Inject into the skin.    ?  potassium chloride SA (KLOR-CON M) 20 MEQ tablet Take 1 tablet (20 mEq total) by mouth 2 (two) times daily. 60 tablet 0  ? prochlorperazine (COMPAZINE) 10 MG tablet Take 1 tablet (10 mg total) by mouth every 6 (six) hours as needed for nausea or vomiting. 90 tablet 3  ? topiramate (TOPAMAX) 50 MG tablet Take 50 mg by mouth daily.    ? valsartan (DIOVAN) 40 MG tablet Take 1 tablet (40 mg total) by mouth daily. 90 tablet 3  ? Vitamin D, Ergocalciferol, (DRISDOL) 1.25 MG (50000 UNIT) CAPS capsule Take 50,000 Units by mouth once a week.    ? ?Current Facility-Administered Medications  ?Medication Dose Route Frequency Provider Last Rate Last Admin  ? 0.9 %  sodium chloride infusion  500 mL Intravenous Once Jackquline Denmark, MD      ? technetium sestamibi generic (CARDIOLITE) injection 76.7 millicurie  34.1 millicurie Intravenous Once PRN Revankar, Reita Cliche, MD      ? ? ?HISTORY OF PRESENT ILLNESS:  ? ?Oncology History  ?Malignant neoplasm of upper-outer quadrant of left female breast ([place])  ?07/23/2013  Initial Diagnosis  ? Malignant neoplasm of upper-outer quadrant of left female breast ([place]) ? ?  ?07/23/2013 Cancer Staging  ? Staging form: Breast, AJCC 7th Edition ?- Clinical stage from 07/23/2013: S

## 2021-11-17 NOTE — Assessment & Plan Note (Signed)
New bone metastases, November 2022, with multiple marrow replacing lesions within the proximal right femur within the ischium as well as the inferior rami. 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 Ki67 was 60%. PET imaging from December 19th revealed dominant finding of intensely hypermetabolic aggressive skeletal metastasis involving the calvarium, sternum, thoracic and lumbar spine, and pelvis, with potential pathologic fracture of the right femur with internal fixation. There is soft tissue extension into the anterior mediastinum associated with the manubrial metastasis. Multiple spinal vertebral body lesions have cortical destruction along the posterior margin.?She started monthly Xgeva in December, and recently completed radiation therapy. She is now receiving THP and will plan for hormonal therapy. She has completed 2 cycles of treatment. This last treatment was rough on her. We brought her in for IVF and antiemetics. Today, she feels much better, but we will plan on a 20% dose reduction for her upcoming cycles. She will return to clinic in one week for repeat evaluation.  ?

## 2021-11-17 NOTE — Assessment & Plan Note (Signed)
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 She was on adjuvant hormonal therapy with tamoxifen 20 mg daily from October 2015 to May 2020, but due to elevation of the liver transaminases was switched to anastrazole ?

## 2021-11-17 NOTE — Assessment & Plan Note (Addendum)
Large wound approximately 10 inches to left lower extremity. This is weeping and appears ulcerated. She also has a similar, but smaller wound noted to her right lower extremity. These began as small "blisters" and have progressed. She has been on Keflex without improvement. We will plan on sending her to Dr. Corena Pilgrim for evaluation with the wound clinic. She is due for treatment next Friday. Hopefully, we can have her evaluated prior to that. We will obtain culture today. She will return to clinic next week as scheduled to see Dr. Hinton Rao.  ?

## 2021-11-18 NOTE — Addendum Note (Signed)
Addended byGeorgette Shell on: 11/18/2021 03:08 PM ? ? Modules accepted: Orders ? ?

## 2021-11-20 ENCOUNTER — Other Ambulatory Visit: Payer: Self-pay | Admitting: Hematology and Oncology

## 2021-11-20 ENCOUNTER — Ambulatory Visit (INDEPENDENT_AMBULATORY_CARE_PROVIDER_SITE_OTHER): Payer: BC Managed Care – PPO

## 2021-11-20 DIAGNOSIS — Z170 Estrogen receptor positive status [ER+]: Secondary | ICD-10-CM

## 2021-11-20 DIAGNOSIS — C50412 Malignant neoplasm of upper-outer quadrant of left female breast: Secondary | ICD-10-CM | POA: Diagnosis not present

## 2021-11-20 DIAGNOSIS — Z0189 Encounter for other specified special examinations: Secondary | ICD-10-CM

## 2021-11-20 LAB — ECHOCARDIOGRAM COMPLETE
Area-P 1/2: 7.59 cm2
S' Lateral: 2.4 cm

## 2021-11-22 ENCOUNTER — Other Ambulatory Visit: Payer: Self-pay | Admitting: Hematology and Oncology

## 2021-11-22 MED ORDER — LEVOFLOXACIN 750 MG PO TABS: 750.0000 mg | ORAL_TABLET | Freq: Every day | ORAL | 0 refills | Status: DC

## 2021-11-22 NOTE — Progress Notes (Signed)
?Patient Care Team: ?Bonnita Nasuti, MD as PCP - General (Internal Medicine) ?Berniece Salines, DO as PCP - Cardiology (Cardiology) ?Derwood Kaplan, MD as Consulting Physician (Oncology) ?Gatha Mayer, MD as Consulting Physician (Radiation Oncology) ?Laurell Roof, RN as Registered Nurse ? ?Clinic Day:  11/23/21 ? ?Referring physician: Bonnita Nasuti, MD ? ?ASSESSMENT & PLAN:  ? ?Assessment & Plan: ?Malignant neoplasm of upper-outer quadrant of left female breast ([place]) ?History of stage IIB hormone receptor positive breast cancer, diagnosed in December 2014, treated with surgery, chemotherapy and radiation therapy. She was on adjuvant hormonal therapy with tamoxifen 20 mg daily from October 2015 to May 2020, but due to elevation of the liver transaminases was switched to anastrazole ?  ?Malignant neoplasm metastatic to bone [facility]) ?New bone metastases, November 2022, with multiple marrow replacing lesions within the proximal right femur within the ischium as well as the inferior rami. 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 Ki67 was 60%. PET imaging from December 19th revealed dominant finding of intensely hypermetabolic aggressive skeletal metastasis involving the calvarium, sternum, thoracic and lumbar spine, and pelvis, with potential pathologic fracture of the right femur with internal fixation. There is soft tissue extension into the anterior mediastinum associated with the manubrial metastasis. Multiple spinal vertebral body lesions have cortical destruction along the posterior margin. She started monthly Xgeva in December, and recently completed radiation therapy. She is now receiving THP and will plan for hormonal therapy. She has completed 3 cycles of treatment. This last treatment was rough on her, but she tells me she did better with this last 1 and 3 made a 20% dose reduction.  We held her dose because of the  large infection of her leg.   ? ?Leg wound, left ?Large wound approximately 10 inches to left lower extremity. This is weeping and appears ulcerated. She also has a similar, but smaller wound noted to her right lower extremity. These began as small "blisters" and have progressed. She has been on Keflex without improvement.  Culture was obtained at her appointment last week and is growing E. coli.  Her antibiotic was switched to Levaquin and she is doing much better.  She is due for treatment Friday and I think we can proceed now. ? ?Hypocalcemia ?The patient is taking 4 calcium daily and yet her calcium is still only 8.2 so we will need to increase her dose to 6 pills daily.  I will also check a vitamin D level.  We will hold her denosumab. ? ?She will continue the Levaquin for the full course and we will proceed with chemotherapy on Friday as planned.  This will be her fourth cycle.  I will hold her denosumab due to the hypocalcemia.  She will need to increase her calcium dose to 6 daily and I will check a vitamin D level.  She will return in 3 weeks with CBC and comprehensive metabolic profile for her fifth cycle of THP. The patient understands the plans discussed today and is in agreement with them.  She knows to contact our office if she develops concerns prior to her next appointment. ? ? ? ?Derwood Kaplan, MD  ?[facility] ?[place] ?[place] [address] ?Dept: [phone] ?Dept Fax: [phone]  ? ?CHIEF COMPLAINT:  ?CC: A 51 year old female with history of breast cancer widely metastatic to bone ? ?  Current Treatment:  THP chemotherapy ? ?INTERVAL HISTORY:  ?Anna [name] is here today for repeat clinical assessment.  She had a large area of weeping and blistering of her leg and culture has subsequently grown E. coli.  Her antibiotic was switched to Levaquin and she is still taking that.  CBC reveals a hemoglobin of 11.0, improved from  10.5, with an MCV of 80.  White count and platelet count are normal.  Comprehensive metabolic profile is mainly remarkable for a nonfasting blood sugar of 219 and a calcium level of 8.2.  Her magnesium level is normal. She denies fevers or chills. She denies pain. Her appetite is good. Her weight has been stable. ? ?I have reviewed the past medical history, past surgical history, social history and family history with the patient and they are unchanged from previous note. ? ?ALLERGIES:  has No Known Allergies. ? ?MEDICATIONS:  ?Current Outpatient Medications  ?Medication Sig Dispense Refill  ? albuterol (VENTOLIN HFA) 108 (90 Base) MCG/ACT inhaler Inhale 2 puffs into the lungs every 6 (six) hours as needed for shortness of breath.    ? ARIPiprazole (ABILIFY) 5 MG tablet Take 2.5 mg by mouth daily.    ? aspirin 81 MG EC tablet Take 81 mg by mouth daily. Swallow whole.    ? atorvastatin (LIPITOR) 20 MG tablet Take 1 tablet (20 mg total) by mouth daily. 90 tablet 3  ? calcium carbonate (OS-CAL) 1250 (500 Ca) MG chewable tablet Chew 6 tablets by mouth daily.    ? desvenlafaxine (PRISTIQ) 50 MG 24 hr tablet Take 50 mg by mouth daily.    ? dexamethasone (DECADRON) 4 MG tablet Take 2 tabs twice daily beginning the day before chemotherapy and for 2 days after, every 3 weeks, by mouth 60 tablet 0  ? diphenoxylate-atropine (LOMOTIL) 2.5-0.025 MG tablet TAKE TWO TABLETS BY MOUTH FOUR TIMES A DAY AS NEEDED FOR DIARRHEA OR LOOSE STOOLS 100 tablet 1  ? FARXIGA 10 MG TABS tablet Take 1 tablet (10 mg total) by mouth daily. 90 tablet 3  ? fluticasone (FLONASE) 50 MCG/ACT nasal spray Place 1 spray into both nostrils as needed.    ? labetalol (NORMODYNE) 200 MG tablet Take 1 tablet (200 mg total) by mouth 2 (two) times daily. 180 tablet 3  ? levofloxacin (LEVAQUIN) 750 MG tablet Take 1 tablet (750 mg total) by mouth daily. 7 tablet 0  ? Loratadine 10 MG CAPS Take 1 capsule (10 mg total) by mouth daily. 30 capsule 6  ? LORazepam  (ATIVAN) 1 MG tablet Take 1 tablet (1 mg total) by mouth every 8 (eight) hours. (Patient taking differently: Take 1 mg by mouth every 8 (eight) hours as needed.) 30 tablet 0  ? morphine (MS CONTIN) 15 MG 12 hr tablet Take 1 tablet (15 mg total) by mouth every 12 (twelve) hours. 60 tablet 0  ? ondansetron (ZOFRAN) 4 MG tablet Take 1 tablet (4 mg total) by mouth every 4 (four) hours as needed for nausea. 90 tablet 3  ? Oxycodone HCl 10 MG TABS Take 1 tablet (10 mg total) by mouth every 4 (four) hours as needed. 120 tablet 0  ? OZEMPIC, 0.25 OR 0.5 MG/DOSE, 2 MG/1.5ML SOPN Inject into the skin.    ? potassium chloride SA (KLOR-CON M) 20 MEQ tablet Take 1 tablet (20 mEq total) by mouth 2 (two) times daily. 60 tablet 0  ? prochlorperazine (COMPAZINE) 10 MG tablet Take 1 tablet (10 mg total) by mouth every 6 (six) hours  as needed for nausea or vomiting. 90 tablet 3  ? topiramate (TOPAMAX) 50 MG tablet Take 50 mg by mouth daily.    ? valsartan (DIOVAN) 40 MG tablet Take 1 tablet (40 mg total) by mouth daily. 90 tablet 3  ? Vitamin D, Ergocalciferol, (DRISDOL) 1.25 MG (50000 UNIT) CAPS capsule Take 50,000 Units by mouth once a week.    ? fluconazole (DIFLUCAN) 150 MG tablet Take 1 tablet (150 mg total) by mouth daily. (Patient not taking: Reported on 10/20/2021) 2 tablet 3  ? magic mouthwash (lidocaine, diphenhydrAMINE, alum & mag hydroxide) suspension Take 5 mLs by mouth every 3 (three) hours as needed. Swish and spit; for throat/mouth pain (Patient not taking: Reported on 11/23/2021)    ? mupirocin nasal ointment (BACTROBAN NASAL) 2 % Place 1 application into the nose 2 (two) times daily. Use one-half of tube in each nostril twice daily for five (5) days. After application, press sides of nose together and gently massage. (Patient not taking: Reported on 11/23/2021) 10 g 0  ? nitroGLYCERIN (NITROSTAT) 0.4 MG SL tablet Place 0.4 mg under the tongue every 5 (five) minutes as needed for chest pain. (Patient not taking: Reported  on 11/23/2021)    ? nystatin (MYCOSTATIN) 100000 UNIT/ML suspension Take 5 mLs (500,000 Units total) by mouth 4 (four) times daily. (Patient not taking: Reported on 11/23/2021) 473 mL 0  ? ?Current Facility-A

## 2021-11-23 ENCOUNTER — Inpatient Hospital Stay: Payer: BC Managed Care – PPO

## 2021-11-23 ENCOUNTER — Other Ambulatory Visit: Payer: Self-pay | Admitting: Oncology

## 2021-11-23 ENCOUNTER — Other Ambulatory Visit: Payer: Self-pay

## 2021-11-23 ENCOUNTER — Inpatient Hospital Stay (INDEPENDENT_AMBULATORY_CARE_PROVIDER_SITE_OTHER): Payer: BC Managed Care – PPO | Admitting: Oncology

## 2021-11-23 VITALS — BP 122/70 | HR 118 | Temp 98.1°F | Resp 18 | Ht 59.0 in | Wt 163.6 lb

## 2021-11-23 DIAGNOSIS — C50412 Malignant neoplasm of upper-outer quadrant of left female breast: Secondary | ICD-10-CM | POA: Diagnosis not present

## 2021-11-23 DIAGNOSIS — Z5189 Encounter for other specified aftercare: Secondary | ICD-10-CM | POA: Diagnosis not present

## 2021-11-23 DIAGNOSIS — C7951 Secondary malignant neoplasm of bone: Secondary | ICD-10-CM

## 2021-11-23 DIAGNOSIS — Z5112 Encounter for antineoplastic immunotherapy: Secondary | ICD-10-CM | POA: Diagnosis present

## 2021-11-23 DIAGNOSIS — Z170 Estrogen receptor positive status [ER+]: Secondary | ICD-10-CM

## 2021-11-23 DIAGNOSIS — Z5111 Encounter for antineoplastic chemotherapy: Secondary | ICD-10-CM | POA: Diagnosis present

## 2021-11-23 LAB — COMPREHENSIVE METABOLIC PANEL
Albumin: 4 (ref 3.5–5.0)
Calcium: 8.2 — AB (ref 8.7–10.7)

## 2021-11-23 LAB — CBC AND DIFFERENTIAL
HCT: 36 (ref 36–46)
Hemoglobin: 11 — AB (ref 12.0–16.0)
Neutrophils Absolute: 10.97
Platelets: 315 10*3/uL (ref 150–400)
WBC: 11.8

## 2021-11-23 LAB — BASIC METABOLIC PANEL
BUN: 14 (ref 4–21)
CO2: 23 — AB (ref 13–22)
Chloride: 105 (ref 99–108)
Creatinine: 0.6 (ref 0.5–1.1)
Glucose: 219
Potassium: 3.9 mEq/L (ref 3.5–5.1)
Sodium: 140 (ref 137–147)

## 2021-11-23 LAB — HEPATIC FUNCTION PANEL
ALT: 21 U/L (ref 7–35)
AST: 26 (ref 13–35)
Alkaline Phosphatase: 114 (ref 25–125)
Bilirubin, Total: 0.4

## 2021-11-23 LAB — MAGNESIUM: Magnesium: 1.8

## 2021-11-23 LAB — CBC: RBC: 4.45 (ref 3.87–5.11)

## 2021-11-24 ENCOUNTER — Inpatient Hospital Stay: Payer: BC Managed Care – PPO

## 2021-11-24 VITALS — BP 125/67 | HR 99 | Temp 98.2°F | Resp 18 | Ht 59.0 in | Wt 162.0 lb

## 2021-11-24 DIAGNOSIS — Z5111 Encounter for antineoplastic chemotherapy: Secondary | ICD-10-CM | POA: Diagnosis not present

## 2021-11-24 DIAGNOSIS — C7951 Secondary malignant neoplasm of bone: Secondary | ICD-10-CM

## 2021-11-24 DIAGNOSIS — Z170 Estrogen receptor positive status [ER+]: Secondary | ICD-10-CM

## 2021-11-24 MED ORDER — HEPARIN SOD (PORK) LOCK FLUSH 100 UNIT/ML IV SOLN
500.0000 [IU] | Freq: Once | INTRAVENOUS | Status: AC | PRN
  Administered 2021-11-24: 500 [IU]

## 2021-11-24 MED ORDER — ACETAMINOPHEN 325 MG PO TABS
650.0000 mg | ORAL_TABLET | Freq: Once | ORAL | Status: AC
  Administered 2021-11-24: 650 mg via ORAL
  Filled 2021-11-24: qty 2

## 2021-11-24 MED ORDER — DIPHENHYDRAMINE HCL 25 MG PO CAPS
50.0000 mg | ORAL_CAPSULE | Freq: Once | ORAL | Status: AC
  Administered 2021-11-24: 50 mg via ORAL
  Filled 2021-11-24: qty 2

## 2021-11-24 MED ORDER — SODIUM CHLORIDE 0.9 % IV SOLN: Freq: Once | INTRAVENOUS | Status: AC

## 2021-11-24 MED ORDER — SODIUM CHLORIDE 0.9 % IV SOLN
60.0000 mg/m2 | Freq: Once | INTRAVENOUS | Status: AC
  Administered 2021-11-24: 110 mg via INTRAVENOUS
  Filled 2021-11-24: qty 11

## 2021-11-24 MED ORDER — SODIUM CHLORIDE 0.9 % IV SOLN
420.0000 mg | Freq: Once | INTRAVENOUS | Status: AC
  Administered 2021-11-24: 420 mg via INTRAVENOUS
  Filled 2021-11-24: qty 14

## 2021-11-24 MED ORDER — SODIUM CHLORIDE 0.9% FLUSH
10.0000 mL | INTRAVENOUS | Status: DC | PRN
  Administered 2021-11-24: 10 mL

## 2021-11-24 MED ORDER — PALONOSETRON HCL INJECTION 0.25 MG/5ML
0.2500 mg | Freq: Once | INTRAVENOUS | Status: AC
  Administered 2021-11-24: 0.25 mg via INTRAVENOUS
  Filled 2021-11-24: qty 5

## 2021-11-24 MED ORDER — SODIUM CHLORIDE 0.9 % IV SOLN
10.0000 mg | Freq: Once | INTRAVENOUS | Status: AC
  Administered 2021-11-24: 10 mg via INTRAVENOUS
  Filled 2021-11-24: qty 10

## 2021-11-24 MED ORDER — TRASTUZUMAB-DKST CHEMO 150 MG IV SOLR
6.0000 mg/kg | Freq: Once | INTRAVENOUS | Status: AC
  Administered 2021-11-24: 462 mg via INTRAVENOUS
  Filled 2021-11-24: qty 22

## 2021-11-24 NOTE — Patient Instructions (Signed)
Docetaxel injection ?What is this medication? ?DOCETAXEL (doe se TAX el) is a chemotherapy drug. It targets fast dividing cells, like cancer cells, and causes these cells to die. This medicine is used to treat many types of cancers like breast cancer, certain stomach cancers, head and neck cancer, lung cancer, and prostate cancer. ?This medicine may be used for other purposes; ask your health care provider or pharmacist if you have questions. ?COMMON BRAND NAME(S): Docefrez, Taxotere ?What should I tell my care team before I take this medication? ?They need to know if you have any of these conditions: ?infection (especially a virus infection such as chickenpox, cold sores, or herpes) ?liver disease ?low blood counts, like low white cell, platelet, or red cell counts ?an unusual or allergic reaction to docetaxel, polysorbate 80, other chemotherapy agents, other medicines, foods, dyes, or preservatives ?pregnant or trying to get pregnant ?breast-feeding ?How should I use this medication? ?This drug is given as an infusion into a vein. It is administered in a hospital or clinic by a specially trained health care professional. ?Talk to your pediatrician regarding the use of this medicine in children. Special care may be needed. ?Overdosage: If you think you have taken too much of this medicine contact a poison control center or emergency room at once. ?NOTE: This medicine is only for you. Do not share this medicine with others. ?What if I miss a dose? ?It is important not to miss your dose. Call your doctor or health care professional if you are unable to keep an appointment. ?What may interact with this medication? ?Do not take this medicine with any of the following medications: ?live virus vaccines ?This medicine may also interact with the following medications: ?aprepitant ?certain antibiotics like erythromycin or clarithromycin ?certain antivirals for HIV or hepatitis ?certain medicines for fungal infections like  fluconazole, itraconazole, ketoconazole, posaconazole, or voriconazole ?cimetidine ?ciprofloxacin ?conivaptan ?cyclosporine ?dronedarone ?fluvoxamine ?grapefruit juice ?imatinib ?verapamil ?This list may not describe all possible interactions. Give your health care provider a list of all the medicines, herbs, non-prescription drugs, or dietary supplements you use. Also tell them if you smoke, drink alcohol, or use illegal drugs. Some items may interact with your medicine. ?What should I watch for while using this medication? ?Your condition will be monitored carefully while you are receiving this medicine. You will need important blood work done while you are taking this medicine. ?Call your doctor or health care professional for advice if you get a fever, chills or sore throat, or other symptoms of a cold or flu. Do not treat yourself. This drug decreases your body's ability to fight infections. Try to avoid being around people who are sick. ?Some products may contain alcohol. Ask your health care professional if this medicine contains alcohol. Be sure to tell all health care professionals you are taking this medicine. Certain medicines, like metronidazole and disulfiram, can cause an unpleasant reaction when taken with alcohol. The reaction includes flushing, headache, nausea, vomiting, sweating, and increased thirst. The reaction can last from 30 minutes to several hours. ?You may get drowsy or dizzy. Do not drive, use machinery, or do anything that needs mental alertness until you know how this medicine affects you. Do not stand or sit up quickly, especially if you are an older patient. This reduces the risk of dizzy or fainting spells. Alcohol may interfere with the effect of this medicine. ?Talk to your health care professional about your risk of cancer. You may be more at risk for certain types  of cancer if you take this medicine. ?Do not become pregnant while taking this medicine or for 6 months after  stopping it. Women should inform their doctor if they wish to become pregnant or think they might be pregnant. There is a potential for serious side effects to an unborn child. Talk to your health care professional or pharmacist for more information. Do not breast-feed an infant while taking this medicine or for 1 week after stopping it. ?Males who get this medicine must use a condom during sex with females who can get pregnant. If you get a woman pregnant, the baby could have birth defects. The baby could die before they are born. You will need to continue wearing a condom for 3 months after stopping the medicine. Tell your health care provider right away if your partner becomes pregnant while you are taking this medicine. ?This may interfere with the ability to father a child. You should talk to your doctor or health care professional if you are concerned about your fertility. ?What side effects may I notice from receiving this medication? ?Side effects that you should report to your doctor or health care professional as soon as possible: ?allergic reactions like skin rash, itching or hives, swelling of the face, lips, or tongue ?blurred vision ?breathing problems ?changes in vision ?low blood counts - This drug may decrease the number of white blood cells, red blood cells and platelets. You may be at increased risk for infections and bleeding. ?nausea and vomiting ?pain, redness or irritation at site where injected ?pain, tingling, numbness in the hands or feet ?redness, blistering, peeling, or loosening of the skin, including inside the mouth ?signs of decreased platelets or bleeding - bruising, pinpoint red spots on the skin, black, tarry stools, nosebleeds ?signs of decreased red blood cells - unusually weak or tired, fainting spells, lightheadedness ?signs of infection - fever or chills, cough, sore throat, pain or difficulty passing urine ?swelling of the ankle, feet, hands ?Side effects that usually do not  require medical attention (report to your doctor or health care professional if they continue or are bothersome): ?constipation ?diarrhea ?fingernail or toenail changes ?hair loss ?loss of appetite ?mouth sores ?muscle pain ?This list may not describe all possible side effects. Call your doctor for medical advice about side effects. You may report side effects to FDA at 1-800-FDA-1088. ?Where should I keep my medication? ?This drug is given in a hospital or clinic and will not be stored at home. ?NOTE: This sheet is a summary. It may not cover all possible information. If you have questions about this medicine, talk to your doctor, pharmacist, or health care provider. ?? 2023 [employer] Standard (2021-06-30 00:00:00) ?Pertuzumab injection ?What is this medication? ?PERTUZUMAB (per TOOZ ue mab) is a monoclonal antibody. It is used to treat breast cancer. ?This medicine may be used for other purposes; ask your health care provider or pharmacist if you have questions. ?COMMON BRAND NAME(S): PERJETA ?What should I tell my care team before I take this medication? ?They need to know if you have any of these conditions: ?heart disease ?heart failure ?high blood pressure ?history of irregular heart beat ?recent or ongoing radiation therapy ?an unusual or allergic reaction to pertuzumab, other medicines, foods, dyes, or preservatives ?pregnant or trying to get pregnant ?breast-feeding ?How should I use this medication? ?This medicine is for infusion into a vein. It is given by a health care professional in a hospital or clinic setting. ?Talk to your pediatrician regarding the  use of this medicine in children. Special care may be needed. ?Overdosage: If you think you have taken too much of this medicine contact a poison control center or emergency room at once. ?NOTE: This medicine is only for you. Do not share this medicine with others. ?What if I miss a dose? ?It is important not to miss your dose. Call your doctor or  health care professional if you are unable to keep an appointment. ?What may interact with this medication? ?Interactions are not expected. ?Give your health care provider a list of all the medicines, herbs, n

## 2021-11-27 ENCOUNTER — Other Ambulatory Visit: Payer: Self-pay

## 2021-11-27 ENCOUNTER — Inpatient Hospital Stay: Payer: BC Managed Care – PPO

## 2021-11-27 VITALS — BP 128/68 | HR 89 | Temp 98.2°F | Resp 18

## 2021-11-27 DIAGNOSIS — C50412 Malignant neoplasm of upper-outer quadrant of left female breast: Secondary | ICD-10-CM

## 2021-11-27 DIAGNOSIS — C7951 Secondary malignant neoplasm of bone: Secondary | ICD-10-CM

## 2021-11-27 DIAGNOSIS — Z5111 Encounter for antineoplastic chemotherapy: Secondary | ICD-10-CM | POA: Diagnosis not present

## 2021-11-27 MED ORDER — PEGFILGRASTIM-CBQV 6 MG/0.6ML ~~LOC~~ SOSY
6.0000 mg | PREFILLED_SYRINGE | Freq: Once | SUBCUTANEOUS | Status: AC
  Administered 2021-11-27: 6 mg via SUBCUTANEOUS
  Filled 2021-11-27: qty 0.6

## 2021-11-27 NOTE — Patient Instructions (Signed)

## 2021-11-28 ENCOUNTER — Encounter: Payer: Self-pay | Admitting: Oncology

## 2021-11-30 ENCOUNTER — Telehealth: Payer: Self-pay | Admitting: Oncology

## 2021-11-30 NOTE — Telephone Encounter (Signed)
11/30/21 spoke with patient and cancelled inj appt on 12/01/21. ?

## 2021-12-01 ENCOUNTER — Ambulatory Visit: Payer: BC Managed Care – PPO

## 2021-12-04 ENCOUNTER — Encounter: Payer: Self-pay | Admitting: Oncology

## 2021-12-06 LAB — VITAMIN D 1,25 DIHYDROXY
Vitamin D 1, 25 (OH)2 Total: 184 pg/mL — ABNORMAL HIGH
Vitamin D2 1, 25 (OH)2: 152 pg/mL
Vitamin D3 1, 25 (OH)2: 32 pg/mL

## 2021-12-07 ENCOUNTER — Telehealth: Payer: Self-pay

## 2021-12-07 ENCOUNTER — Other Ambulatory Visit: Payer: Self-pay | Admitting: Hematology and Oncology

## 2021-12-07 MED ORDER — LEVOFLOXACIN 750 MG PO TABS
750.0000 mg | ORAL_TABLET | Freq: Every day | ORAL | 0 refills | Status: DC
Start: 2021-12-07 — End: 2021-12-20

## 2021-12-07 NOTE — Telephone Encounter (Signed)
-----   Message from Melodye Ped, NP sent at 12/07/2021  3:44 PM EDT ----- ?Regarding: RE: antiobiotic ?Prescription sent ?----- Message ----- ?From: Georgette Shell, RN ?Sent: 12/07/2021   9:17 AM EDT ?To: Melodye Ped, NP ?Subject: antiobiotic                                   ? ?Would on leg is some better but has starting weeping again.  Wants to know if she needs another round of antibiotics. ? ? ?

## 2021-12-11 ENCOUNTER — Telehealth: Payer: Self-pay

## 2021-12-11 NOTE — Telephone Encounter (Signed)
-----   Message from Melodye Ped, NP sent at 12/11/2021  8:30 AM EDT ----- ?Regarding: RE: weeping leg ?We need to get her into wound clinic. Who did Dr. Hinton Rao say took that over? ? ?----- Message ----- ?From: Georgette Shell, RN ?Sent: 12/11/2021   8:28 AM EDT ?To: Melodye Ped, NP ?Subject: weeping leg                                   ? ?Called said she picked up the antibiotic but the leg is still weeping and had progressed to her heel.  She asked what she should do now? ? ? ?

## 2021-12-11 NOTE — Telephone Encounter (Signed)
Referred to [facility], clinicals faxed.  Patient aware. ?

## 2021-12-13 ENCOUNTER — Encounter: Payer: Self-pay | Admitting: Surgery

## 2021-12-14 ENCOUNTER — Inpatient Hospital Stay: Payer: BC Managed Care – PPO

## 2021-12-14 ENCOUNTER — Encounter: Payer: Self-pay | Admitting: Hematology and Oncology

## 2021-12-14 ENCOUNTER — Inpatient Hospital Stay: Payer: BC Managed Care – PPO | Attending: Oncology | Admitting: Hematology and Oncology

## 2021-12-14 ENCOUNTER — Other Ambulatory Visit: Payer: Self-pay

## 2021-12-14 ENCOUNTER — Encounter: Payer: Self-pay | Admitting: Oncology

## 2021-12-14 DIAGNOSIS — C7951 Secondary malignant neoplasm of bone: Secondary | ICD-10-CM | POA: Insufficient documentation

## 2021-12-14 DIAGNOSIS — S81802A Unspecified open wound, left lower leg, initial encounter: Secondary | ICD-10-CM | POA: Diagnosis not present

## 2021-12-14 DIAGNOSIS — Z170 Estrogen receptor positive status [ER+]: Secondary | ICD-10-CM

## 2021-12-14 DIAGNOSIS — Z5112 Encounter for antineoplastic immunotherapy: Secondary | ICD-10-CM | POA: Insufficient documentation

## 2021-12-14 DIAGNOSIS — Z79899 Other long term (current) drug therapy: Secondary | ICD-10-CM | POA: Insufficient documentation

## 2021-12-14 DIAGNOSIS — C50412 Malignant neoplasm of upper-outer quadrant of left female breast: Secondary | ICD-10-CM | POA: Insufficient documentation

## 2021-12-14 DIAGNOSIS — Z5111 Encounter for antineoplastic chemotherapy: Secondary | ICD-10-CM | POA: Insufficient documentation

## 2021-12-14 DIAGNOSIS — Z5189 Encounter for other specified aftercare: Secondary | ICD-10-CM | POA: Insufficient documentation

## 2021-12-14 LAB — COMPREHENSIVE METABOLIC PANEL
Albumin: 3.8 (ref 3.5–5.0)
Calcium: 9 (ref 8.7–10.7)

## 2021-12-14 LAB — BASIC METABOLIC PANEL
BUN: 17 (ref 4–21)
CO2: 28 — AB (ref 13–22)
Chloride: 103 (ref 99–108)
Creatinine: 0.6 (ref 0.5–1.1)
Glucose: 116
Potassium: 3.4 mEq/L — AB (ref 3.5–5.1)
Sodium: 141 (ref 137–147)

## 2021-12-14 LAB — CBC AND DIFFERENTIAL
HCT: 36 (ref 36–46)
Hemoglobin: 10.9 — AB (ref 12.0–16.0)
Neutrophils Absolute: 9.61
Platelets: 336 K/uL (ref 150–400)
WBC: 11.3

## 2021-12-14 LAB — HEPATIC FUNCTION PANEL
ALT: 15 U/L (ref 7–35)
AST: 26 (ref 13–35)
Alkaline Phosphatase: 101 (ref 25–125)
Bilirubin, Total: 0.5

## 2021-12-14 LAB — MAGNESIUM: Magnesium: 1.9

## 2021-12-14 LAB — CBC: RBC: 4.36 (ref 3.87–5.11)

## 2021-12-14 MED ORDER — ONDANSETRON HCL 4 MG PO TABS: 4.0000 mg | ORAL_TABLET | ORAL | 3 refills | Status: DC | PRN

## 2021-12-14 MED ORDER — LORAZEPAM 1 MG PO TABS: 1.0000 mg | ORAL_TABLET | Freq: Three times a day (TID) | ORAL | 0 refills | Status: DC

## 2021-12-14 NOTE — Assessment & Plan Note (Signed)
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 She was on adjuvant hormonal therapy with tamoxifen 20 mg daily from October 2015 to May 2020, but due to elevation of the liver transaminases was switched to anastrazole ?

## 2021-12-14 NOTE — Assessment & Plan Note (Signed)
New bone metastases, November 2022, with multiple marrow replacing lesions within the proximal right femur within the ischium as well as the inferior rami. 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 Ki67 was 60%. PET imaging from December 19th revealed dominant finding of intensely hypermetabolic aggressive skeletal metastasis involving the calvarium, sternum, thoracic and lumbar spine, and pelvis, with potential pathologic fracture of the right femur with internal fixation. There is soft tissue extension into the anterior mediastinum associated with the manubrial metastasis. 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 She has completed 3 cycles of treatment. Cycle 4 was delayed and dose reduction made by 20% due to left leg infection. We will delay cycle 5 one week as well until she sees dermatology.  ?

## 2021-12-14 NOTE — Progress Notes (Signed)
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 She was on adjuvant hormonal therapy with tamoxifen 20 mg daily from October 2015 to May 2020, but due to elevation of the liver transaminases was switched to anastrazole  Malignant neoplasm metastatic to bone [facility]) New bone metastases, November 2022, with multiple marrow replacing lesions within the proximal right femur within the ischium as well as the inferior rami. 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 Ki67 was 60%. PET imaging from December 19th revealed dominant finding of intensely hypermetabolic aggressive skeletal metastasis involving the calvarium, sternum, thoracic and lumbar spine, and pelvis, with potential pathologic fracture of the right femur with internal fixation. There is soft tissue extension into the anterior mediastinum associated with the manubrial metastasis. Multiple spinal vertebral body lesions have cortical destruction along the posterior margin. She started monthly Xgeva in December, and recently completed radiation therapy. She is now receiving THP and will plan for hormonal therapy. She has completed 3 cycles of treatment. Cycle 4 was delayed and dose reduction made by 20% due to left leg infection. We will delay cycle 5 one week as well until she sees dermatology.   Leg  wound, left Large wound approximately 10 inches to left lower extremity. This is weeping and appears ulcerated. She also has a similar, but smaller wound noted to her right lower extremity. These began as small "blisters" and have progressed. She has been on Keflex without improvement. Culture was obtained and the wound was growing E. coli.  Her antibiotic was switched to Levaquin and she seemed to improve. After treatment, she noticed the leg becoming worse again. We restarted antibiotics and referred her to the wound clinic. She was evaluated and found to have a pressure ulcer to her left heel that will continue to be treated by the wound clinic. However, they are unsure as to what to do with the leg wound and recommended dermatology. Right now her leg is wrapped with xeroform, ABD pads and kerlex. They have supplies at home to continue this. Her husband states the skin continues to slough off, but the underlying skin appears normal. We will await dermatology's evaluation.    The patient understands the plans discussed today and is in agreement with them.  She knows to contact our office if she develops concerns prior to her next appointment.     Melodye Ped, NP  [place] [address] [place] [address] Dept: [phone] Dept Fax: [phone]   Orders Placed This Encounter  Procedures   CBC and differential    This external order was created through the Results Console.   CBC    This external order was created through the Results Console.   Basic metabolic panel    This external order was created through the Results Console.   Comprehensive metabolic panel    This external order  was created through the Results Console.   Hepatic function panel    This external order was created through the Results Console.   Magnesium    This order was created through External Result Entry      CHIEF COMPLAINT:  CC: A  51 year old female with history of breast cancer here for 3 week evaluation   Current Treatment:  THP  INTERVAL HISTORY:  Rossi is here today for repeat clinical assessment. She denies fevers or chills. She denies pain. Her appetite is good. Her weight has been stable.  I have reviewed the past medical history, past surgical history, social history and family history with the patient and they are unchanged from previous note.  ALLERGIES:  has No Known Allergies.  MEDICATIONS:  Current Outpatient Medications  Medication Sig Dispense Refill   albuterol (VENTOLIN HFA) 108 (90 Base) MCG/ACT inhaler Inhale 2 puffs into the lungs every 6 (six) hours as needed for shortness of breath.     ARIPiprazole (ABILIFY) 5 MG tablet Take 2.5 mg by mouth daily.     aspirin 81 MG EC tablet Take 81 mg by mouth daily. Swallow whole.     atorvastatin (LIPITOR) 20 MG tablet Take 1 tablet (20 mg total) by mouth daily. 90 tablet 3   calcium carbonate (OS-CAL) 1250 (500 Ca) MG chewable tablet Chew 6 tablets by mouth daily.     desvenlafaxine (PRISTIQ) 50 MG 24 hr tablet Take 50 mg by mouth daily.     dexamethasone (DECADRON) 4 MG tablet Take 2 tabs twice daily beginning the day before chemotherapy and for 2 days after, every 3 weeks, by mouth 60 tablet 0   diphenoxylate-atropine (LOMOTIL) 2.5-0.025 MG tablet TAKE TWO TABLETS BY MOUTH FOUR TIMES A DAY AS NEEDED FOR DIARRHEA OR LOOSE STOOLS 100 tablet 1   FARXIGA 10 MG TABS tablet Take 1 tablet (10 mg total) by mouth daily. 90 tablet 3   fluconazole (DIFLUCAN) 150 MG tablet Take 1 tablet (150 mg total) by mouth daily. (Patient not taking: Reported on 10/20/2021) 2 tablet 3   fluticasone (FLONASE) 50 MCG/ACT nasal spray Place 1 spray into both nostrils as needed.     labetalol (NORMODYNE) 200 MG tablet Take 1 tablet (200 mg total) by mouth 2 (two) times daily. 180 tablet 3   levofloxacin (LEVAQUIN) 750 MG tablet Take 1 tablet (750 mg total) by mouth daily. 7 tablet 0    Loratadine 10 MG CAPS Take 1 capsule (10 mg total) by mouth daily. 30 capsule 6   LORazepam (ATIVAN) 1 MG tablet Take 1 tablet (1 mg total) by mouth every 8 (eight) hours. 30 tablet 0   magic mouthwash (lidocaine, diphenhydrAMINE, alum & mag hydroxide) suspension Take 5 mLs by mouth every 3 (three) hours as needed. Swish and spit; for throat/mouth pain (Patient not taking: Reported on 11/23/2021)     morphine (MS CONTIN) 15 MG 12 hr tablet Take 1 tablet (15 mg total) by mouth every 12 (twelve) hours. 60 tablet 0   mupirocin nasal ointment (BACTROBAN NASAL) 2 % Place 1 application into the nose 2 (two) times daily. Use one-half of tube in each nostril twice daily for five (5) days. After application, press sides of nose together and gently massage. (Patient not taking: Reported on 11/23/2021) 10 g 0   nitroGLYCERIN (NITROSTAT) 0.4 MG SL tablet Place 0.4 mg under the tongue every 5 (five) minutes as needed for chest pain. (Patient not taking: Reported on 11/23/2021)  nystatin (MYCOSTATIN) 100000 UNIT/ML suspension Take 5 mLs (500,000 Units total) by mouth 4 (four) times daily. (Patient not taking: Reported on 11/23/2021) 473 mL 0   ondansetron (ZOFRAN) 4 MG tablet Take 1 tablet (4 mg total) by mouth every 4 (four) hours as needed for nausea. 90 tablet 3   Oxycodone HCl 10 MG TABS Take 1 tablet (10 mg total) by mouth every 4 (four) hours as needed. 120 tablet 0   OZEMPIC, 0.25 OR 0.5 MG/DOSE, 2 MG/1.5ML SOPN Inject into the skin.     potassium chloride SA (KLOR-CON M) 20 MEQ tablet Take 1 tablet (20 mEq total) by mouth 2 (two) times daily. 60 tablet 0   prochlorperazine (COMPAZINE) 10 MG tablet Take 1 tablet (10 mg total) by mouth every 6 (six) hours as needed for nausea or vomiting. 90 tablet 3   topiramate (TOPAMAX) 50 MG tablet Take 50 mg by mouth daily.     valsartan (DIOVAN) 40 MG tablet Take 1 tablet (40 mg total) by mouth daily. 90 tablet 3   Vitamin D, Ergocalciferol, (DRISDOL) 1.25 MG (50000  UNIT) CAPS capsule Take 50,000 Units by mouth once a week.     Current Facility-Administered Medications  Medication Dose Route Frequency Provider Last Rate Last Admin   0.9 %  sodium chloride infusion  500 mL Intravenous Once Jackquline Denmark, MD       technetium sestamibi generic (CARDIOLITE) injection 93.7 millicurie  34.2 millicurie Intravenous Once PRN Revankar, Reita Cliche, MD        HISTORY OF PRESENT ILLNESS:   Oncology History  Malignant neoplasm of upper-outer quadrant of left female breast ([place])  07/23/2013 Initial Diagnosis   Malignant neoplasm of upper-outer quadrant of left female breast ([place])    07/23/2013 Cancer Staging   Staging form: Breast, AJCC 7th Edition - Clinical stage from 07/23/2013: Stage IIB (T2, N1, M0) - Signed by Derwood Kaplan, MD on 07/04/2020 Prognostic indicators: Pos LVI    09/22/2021 -  Chemotherapy   Patient is on Treatment Plan : BREAST DOCEtaxel + Trastuzumab + Pertuzumab (THP) q21d x 8 cycles / Trastuzumab + Pertuzumab q21d x 4 cycles       Malignant neoplasm metastatic to bone ([place])  06/28/2021 Initial Diagnosis   Bone metastases ([place])    09/22/2021 -  Chemotherapy   Patient is on Treatment Plan : BREAST DOCEtaxel + Trastuzumab + Pertuzumab (THP) q21d x 8 cycles / Trastuzumab + Pertuzumab q21d x 4 cycles           REVIEW OF SYSTEMS:   Constitutional: Denies fevers, chills or abnormal weight loss Eyes: Denies blurriness of vision Ears, nose, mouth, throat, and face: Denies mucositis or sore throat Respiratory: Denies cough, dyspnea or wheezes Cardiovascular: Denies palpitation, chest discomfort or lower extremity swelling Gastrointestinal:  Denies nausea, heartburn or change in bowel habits Skin: Denies abnormal skin rashes Lymphatics: Denies new lymphadenopathy or easy bruising Neurological:Denies numbness, tingling or new weaknesses Behavioral/Psych: Mood is stable, no new changes  All other systems were reviewed with the  patient and are negative.   VITALS:  Blood pressure 108/64, pulse (!) 114, temperature 97.9 F (36.6 C), temperature source Oral, resp. rate 18, height 4' 11"  (1.499 m), weight 160 lb 6.4 oz (72.8 kg), last menstrual period 08/18/2013, SpO2 98 %.  Wt Readings from Last 3 Encounters:  12/14/21 160 lb 6.4 oz (72.8 kg)  11/24/21 162 lb (73.5 kg)  11/23/21 163 lb 9.6 oz (74.2 kg)    Body mass  index is 32.4 kg/m.  Performance status (ECOG): 1 - Symptomatic but completely ambulatory  PHYSICAL EXAM:   GENERAL:alert, no distress and comfortable SKIN: skin color, texture, turgor are normal, no rashes or significant lesions EYES: normal, Conjunctiva are pink and non-injected, sclera clear OROPHARYNX:no exudate, no erythema and lips, buccal mucosa, and tongue normal  NECK: supple, thyroid normal size, non-tender, without nodularity LYMPH:  no palpable lymphadenopathy in the cervical, axillary or inguinal LUNGS: clear to auscultation and percussion with normal breathing effort HEART: regular rate & rhythm and no murmurs and no lower extremity edema ABDOMEN:abdomen soft, non-tender and normal bowel sounds Musculoskeletal:no cyanosis of digits and no clubbing  NEURO: alert & oriented x 3 with fluent speech, no focal motor/sensory deficits  LABORATORY DATA:  I have reviewed the data as listed    Component Value Date/Time   NA 141 12/14/2021 0000   K 3.4 (A) 12/14/2021 0000   CL 103 12/14/2021 0000   CO2 28 (A) 12/14/2021 0000   GLUCOSE 147 (H) 07/05/2021 0330   BUN 17 12/14/2021 0000   CREATININE 0.6 12/14/2021 0000   CREATININE 0.71 07/05/2021 0330   CALCIUM 9.0 12/14/2021 0000   PROT 7.7 06/30/2021 0833   ALBUMIN 3.8 12/14/2021 0000   AST 26 12/14/2021 0000   ALT 15 12/14/2021 0000   ALKPHOS 101 12/14/2021 0000   BILITOT 0.7 06/30/2021 0833   GFRNONAA >60 07/05/2021 0330    No results found for: SPEP, UPEP  Lab Results  Component Value Date   WBC 11.3 12/14/2021    NEUTROABS 9.61 12/14/2021   HGB 10.9 (A) 12/14/2021   HCT 36 12/14/2021   MCV 79 (A) 09/06/2021   PLT 336 12/14/2021      Chemistry      Component Value Date/Time   NA 141 12/14/2021 0000   K 3.4 (A) 12/14/2021 0000   CL 103 12/14/2021 0000   CO2 28 (A) 12/14/2021 0000   BUN 17 12/14/2021 0000   CREATININE 0.6 12/14/2021 0000   CREATININE 0.71 07/05/2021 0330   GLU 116 12/14/2021 0000      Component Value Date/Time   CALCIUM 9.0 12/14/2021 0000   ALKPHOS 101 12/14/2021 0000   AST 26 12/14/2021 0000   ALT 15 12/14/2021 0000   BILITOT 0.7 06/30/2021 4970       RADIOGRAPHIC STUDIES: I have personally reviewed the radiological images as listed and agreed with the findings in the report. ECHOCARDIOGRAM COMPLETE  Result Date: 11/20/2021    ECHOCARDIOGRAM REPORT   Patient Name:   DEMARA LOVER Date of Exam: 11/20/2021 Medical Rec #:  [id number]           Height:       59.0 in Accession #:    [id number]          Weight:       162.0 lb Date of Birth:  1971/08/12          BSA:          1.686 m Patient Age:    33 years            BP:           120/77 mmHg Patient Gender: F                   HR:           110 bpm. Exam Location:  [place] Procedure: 2D Echo, Cardiac Doppler and Color Doppler Indications:    Chemo Z09  History:        Patient has no prior history of Echocardiogram examinations.                 Signs/Symptoms:Murmur; Risk Factors:Diabetes, Hypertension and                 Dyslipidemia.  Sonographer:    Luane School RDCS Referring Phys: [id number] Ingram Onnen A Dennette Faulconer  Sonographer Comments: Suboptimal apical window and Technically challenging study due to limited acoustic windows. Global longitudinal strain was attempted. Poor apical window. Contrast needed but refused per patient request. IMPRESSIONS  1. TDS, pt refused Definity. Left ventricular ejection fraction, by estimation, is 55 to 60%. The left ventricle has normal function. The left ventricle has no regional wall motion  abnormalities. Left ventricular diastolic parameters were normal.  2. Right ventricular systolic function is normal. The right ventricular size is normal.  3. The mitral valve is normal in structure. No evidence of mitral valve regurgitation. No evidence of mitral stenosis.  4. The aortic valve is normal in structure. Aortic valve regurgitation is not visualized. No aortic stenosis is present.  5. The inferior vena cava is normal in size with greater than 50% respiratory variability, suggesting right atrial pressure of 3 mmHg. FINDINGS  Left Ventricle: TDS, pt refused Definity. Left ventricular ejection fraction, by estimation, is 55 to 60%. The left ventricle has normal function. The left ventricle has no regional wall motion abnormalities. The left ventricular internal cavity size was normal in size. There is no left ventricular hypertrophy. Left ventricular diastolic parameters were normal. Right Ventricle: The right ventricular size is normal. No increase in right ventricular wall thickness. Right ventricular systolic function is normal. Left Atrium: Left atrial size was normal in size. Right Atrium: Right atrial size was normal in size. Pericardium: There is no evidence of pericardial effusion. Mitral Valve: The mitral valve is normal in structure. No evidence of mitral valve regurgitation. No evidence of mitral valve stenosis. Tricuspid Valve: The tricuspid valve is normal in structure. Tricuspid valve regurgitation is trivial. No evidence of tricuspid stenosis. Aortic Valve: The aortic valve is normal in structure. Aortic valve regurgitation is not visualized. No aortic stenosis is present. Pulmonic Valve: The pulmonic valve was normal in structure. Pulmonic valve regurgitation is not visualized. No evidence of pulmonic stenosis. Aorta: The aortic root is normal in size and structure. Venous: The inferior vena cava is normal in size with greater than 50% respiratory variability, suggesting right atrial  pressure of 3 mmHg. IAS/Shunts: No atrial level shunt detected by color flow Doppler.  LEFT VENTRICLE PLAX 2D LVIDd:         3.30 cm   Diastology LVIDs:         2.40 cm   LV e' medial:    10.20 cm/s LV PW:         1.00 cm   LV E/e' medial:  9.7 LV IVS:        1.00 cm   LV e' lateral:   11.50 cm/s LVOT diam:     2.00 cm   LV E/e' lateral: 8.6 LV SV:         46 LV SV Index:   27 LVOT Area:     3.14 cm  RIGHT VENTRICLE RV S prime:     12.40 cm/s LEFT ATRIUM             Index LA diam:        3.20 cm 1.90 cm/m LA Vol (A2C):  37.7 ml 22.36 ml/m LA Vol (A4C):   26.5 ml 15.71 ml/m LA Biplane Vol: 32.3 ml 19.15 ml/m  AORTIC VALVE LVOT Vmax:   80.80 cm/s LVOT Vmean:  51.900 cm/s LVOT VTI:    0.146 m  AORTA Ao Root diam: 2.90 cm Ao Asc diam:  2.70 cm Ao Desc diam: 2.10 cm MITRAL VALVE               TRICUSPID VALVE MV Area (PHT): 7.59 cm    TR Peak grad:   10.1 mmHg MV Decel Time: 100 msec    TR Vmax:        159.00 cm/s MV E velocity: 99.10 cm/s MV A velocity: 68.10 cm/s  SHUNTS MV E/A ratio:  1.46        Systemic VTI:  0.15 m                            Systemic Diam: 2.00 cm Jenne Campus MD Electronically signed by Jenne Campus MD Signature Date/Time: 11/20/2021/11:39:01 AM    Final

## 2021-12-14 NOTE — Assessment & Plan Note (Signed)
Large wound approximately 10 inches to left lower extremity. This is weeping and appears ulcerated. She also has a similar, but smaller wound noted to her right lower extremity. These began as small "blisters" and have progressed. She has been on Keflex without improvement. Culture was obtained and the wound was growing E. coli.  Her antibiotic was switched to Levaquin and she seemed to improve. After treatment, she noticed the leg becoming worse again. We restarted antibiotics and referred her to the wound clinic. She was evaluated and found to have a pressure ulcer to her left heel that will continue to be treated by the wound clinic. However, they are unsure as to what to do with the leg wound and recommended dermatology. Right now her leg is wrapped with xeroform, ABD pads and kerlex. They have supplies at home to continue this. Her husband states the skin continues to slough off, but the underlying skin appears normal. We will await dermatology's evaluation.  ?

## 2021-12-15 ENCOUNTER — Ambulatory Visit: Payer: BC Managed Care – PPO

## 2021-12-18 ENCOUNTER — Ambulatory Visit: Payer: BC Managed Care – PPO

## 2021-12-19 ENCOUNTER — Telehealth: Payer: Self-pay | Admitting: Dietician

## 2021-12-19 ENCOUNTER — Telehealth: Payer: Self-pay

## 2021-12-19 NOTE — Telephone Encounter (Signed)
-----   Message from Melodye Ped, NP sent at 12/19/2021  9:19 AM EDT ----- ?Regarding: RE: dermatology referral ?Yes, we sent it last week. Can you check with [facility]pers to see if they got it? ?----- Message ----- ?From: Daryel November, LPN ?Sent: 12/15/2021   4:55 PM EDT ?To: Melodye Ped, NP ?Subject: dermatology referral                          ? ?Patient has not heard from dermatology did you send a referral? ? ? ?

## 2021-12-19 NOTE — Telephone Encounter (Signed)
Message left for patient, I spoke with Myra at [facility] dermatology, She voiced the lady that handles referrals is out and with [employer] all she has to do is call and sch an appt at her convenience notes already sent per [facility]. ?

## 2021-12-19 NOTE — Telephone Encounter (Signed)
Third and final attempt to reach patient by telephone. She has continued to screen on MST report. Have left messages with return number. Please consult RD for future needs.   ? ?April Manson, RDN, LDN ?Registered Dietitian, [place] ?Part Time Remote (Usual office hours: Tuesday-Thursday) ?Remote Office: [phone]  ?

## 2021-12-20 ENCOUNTER — Encounter: Payer: Self-pay | Admitting: Hematology and Oncology

## 2021-12-20 ENCOUNTER — Inpatient Hospital Stay: Payer: BC Managed Care – PPO

## 2021-12-20 ENCOUNTER — Inpatient Hospital Stay (INDEPENDENT_AMBULATORY_CARE_PROVIDER_SITE_OTHER): Payer: BC Managed Care – PPO | Admitting: Hematology and Oncology

## 2021-12-20 ENCOUNTER — Encounter: Payer: Self-pay | Admitting: Oncology

## 2021-12-20 DIAGNOSIS — C7951 Secondary malignant neoplasm of bone: Secondary | ICD-10-CM | POA: Diagnosis not present

## 2021-12-20 DIAGNOSIS — Z170 Estrogen receptor positive status [ER+]: Secondary | ICD-10-CM

## 2021-12-20 DIAGNOSIS — C50412 Malignant neoplasm of upper-outer quadrant of left female breast: Secondary | ICD-10-CM

## 2021-12-20 DIAGNOSIS — M545 Low back pain, unspecified: Secondary | ICD-10-CM

## 2021-12-20 DIAGNOSIS — R0789 Other chest pain: Secondary | ICD-10-CM

## 2021-12-20 DIAGNOSIS — S81802A Unspecified open wound, left lower leg, initial encounter: Secondary | ICD-10-CM

## 2021-12-20 LAB — BASIC METABOLIC PANEL
BUN: 23 — AB (ref 4–21)
CO2: 29 — AB (ref 13–22)
Chloride: 103 (ref 99–108)
Creatinine: 0.7 (ref 0.5–1.1)
Glucose: 89
Potassium: 3.7 mEq/L (ref 3.5–5.1)
Sodium: 140 (ref 137–147)

## 2021-12-20 LAB — HEPATIC FUNCTION PANEL
ALT: 16 U/L (ref 7–35)
AST: 24 (ref 13–35)
Alkaline Phosphatase: 84 (ref 25–125)
Bilirubin, Total: 0.4

## 2021-12-20 LAB — CBC AND DIFFERENTIAL
HCT: 34 — AB (ref 36–46)
Hemoglobin: 10.5 — AB (ref 12.0–16.0)
Neutrophils Absolute: 5.62
Platelets: 363 10*3/uL (ref 150–400)
WBC: 7.7

## 2021-12-20 LAB — CBC: RBC: 4.21 (ref 3.87–5.11)

## 2021-12-20 LAB — COMPREHENSIVE METABOLIC PANEL
Albumin: 3.6 (ref 3.5–5.0)
Calcium: 8.9 (ref 8.7–10.7)

## 2021-12-20 MED ORDER — OXYCODONE HCL 10 MG PO TABS: 10.0000 mg | ORAL_TABLET | ORAL | 0 refills | Status: DC | PRN

## 2021-12-20 NOTE — Assessment & Plan Note (Signed)
Malignant neoplasm metastatic to bone [facility]) ?New bone metastases, November 2022, with multiple marrow replacing lesions within the proximal right femur within the ischium as well as the inferior rami. 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 Ki67 was 60%. PET imaging from December 19th revealed dominant finding of intensely hypermetabolic aggressive skeletal metastasis involving the calvarium, sternum, thoracic and lumbar spine, and pelvis, with potential pathologic fracture of the right femur with internal fixation. There is soft tissue extension into the anterior mediastinum associated with the manubrial metastasis. 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 She has completed?3?cycles of treatment. Cycle 4 was delayed and dose reduction made by 20% due to left leg infection. This is being wrapped daily under the care of the wound clinic with Dr. Corena Pilgrim. She has appointment with dermatology this week. She will proceed with cycle 5 Friday. We will see her back in clinic in 3 weeks for repeat evaluation.  ?

## 2021-12-20 NOTE — Assessment & Plan Note (Signed)
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 She was on adjuvant hormonal therapy with tamoxifen 20 mg daily from October 2015 to May 2020, but due to elevation of the liver transaminases was switched to anastrazole ?

## 2021-12-20 NOTE — Progress Notes (Signed)
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 She was on adjuvant hormonal therapy with tamoxifen 20 mg daily from October 2015 to May 2020, but due to elevation of the liver transaminases was switched to anastrazole  Malignant neoplasm metastatic to bone [facility]) Malignant neoplasm metastatic to bone [facility]) New bone metastases, November 2022, with multiple marrow replacing lesions within the proximal right femur within the ischium as well as the inferior rami. 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 Ki67 was 60%. PET imaging from December 19th revealed dominant finding of intensely hypermetabolic aggressive skeletal metastasis involving the calvarium, sternum, thoracic and lumbar spine, and pelvis, with potential pathologic fracture of the right femur with internal fixation. There is soft tissue extension into the anterior mediastinum associated with the manubrial metastasis. Multiple spinal vertebral body lesions have cortical destruction along the posterior margin. She started monthly Xgeva in December, and recently completed radiation therapy. She is now receiving THP and will plan for hormonal therapy. She has completed 3 cycles of treatment. Cycle 4 was delayed and dose reduction made by 20% due to left leg infection. This is being wrapped daily  under the care of the wound clinic with Dr. Corena Pilgrim. She has appointment with dermatology this week. She will proceed with cycle 5 Friday. We will see her back in clinic in 3 weeks for repeat evaluation.   Leg wound, left Large wound approximately 10 inches to left lower extremity. This is weeping and appears ulcerated. She also has a similar, but smaller wound noted to her right lower extremity. These began as small "blisters" and have progressed. She has been on Keflex without improvement. Culture was obtained and the wound was growing E. coli.  Her antibiotic was switched to Levaquin and she seemed to improve. After treatment, she noticed the leg becoming worse again. We restarted antibiotics and referred her to the wound clinic. She was evaluated and found to have a pressure ulcer to her left heel that will continue to be treated by the wound clinic. However, they are unsure as to what to do with the leg wound and recommended dermatology. Right now her leg is wrapped with xeroform, ABD pads and kerlex. They have supplies at home to continue this. Her husband states the skin continues to slough off, but the underlying skin appears normal. She will meet with dermatology this week.    The patient understands the plans discussed today and is in agreement with them.  She knows to contact our office if she develops concerns prior to her next appointment.     Melodye Ped, NP  [facility] [address] [place] [address] Dept: [phone] Dept Fax: [phone]   Orders Placed This Encounter  Procedures   CBC and differential    This external order was created through the Results Console.   CBC  This external order was created through the Results Console.   Basic metabolic panel    This external order was created through the Results Console.   Comprehensive metabolic panel    This external order was created through  the Results Console.   Hepatic function panel    This external order was created through the Results Console.      CHIEF COMPLAINT:  CC: A 51 year old female with history of breast cancer here for 3 week evaluation  Current Treatment:  Trastuzumab/ Pertuzumab  INTERVAL HISTORY:  Anna [name] is here today for repeat clinical assessment. She denies fevers or chills. She denies pain. Her appetite is good. Her weight has been stable.  I have reviewed the past medical history, past surgical history, social history and family history with the patient and they are unchanged from previous note.  ALLERGIES:  has No Known Allergies.  MEDICATIONS:  Current Outpatient Medications  Medication Sig Dispense Refill   albuterol (VENTOLIN HFA) 108 (90 Base) MCG/ACT inhaler Inhale 2 puffs into the lungs every 6 (six) hours as needed for shortness of breath.     ARIPiprazole (ABILIFY) 5 MG tablet Take 2.5 mg by mouth daily.     aspirin 81 MG EC tablet Take 81 mg by mouth daily. Swallow whole.     atorvastatin (LIPITOR) 20 MG tablet Take 1 tablet (20 mg total) by mouth daily. 90 tablet 3   calcium carbonate (OS-CAL) 1250 (500 Ca) MG chewable tablet Chew 6 tablets by mouth daily.     desvenlafaxine (PRISTIQ) 50 MG 24 hr tablet Take 50 mg by mouth daily.     dexamethasone (DECADRON) 4 MG tablet Take 2 tabs twice daily beginning the day before chemotherapy and for 2 days after, every 3 weeks, by mouth 60 tablet 0   diphenoxylate-atropine (LOMOTIL) 2.5-0.025 MG tablet TAKE TWO TABLETS BY MOUTH FOUR TIMES A DAY AS NEEDED FOR DIARRHEA OR LOOSE STOOLS 100 tablet 1   FARXIGA 10 MG TABS tablet Take 1 tablet (10 mg total) by mouth daily. 90 tablet 3   fluticasone (FLONASE) 50 MCG/ACT nasal spray Place 1 spray into both nostrils as needed.     labetalol (NORMODYNE) 200 MG tablet Take 1 tablet (200 mg total) by mouth 2 (two) times daily. 180 tablet 3   Loratadine 10 MG CAPS Take 1 capsule (10 mg total) by mouth daily. 30  capsule 6   LORazepam (ATIVAN) 1 MG tablet Take 1 tablet (1 mg total) by mouth every 8 (eight) hours. 30 tablet 0   magic mouthwash (lidocaine, diphenhydrAMINE, alum & mag hydroxide) suspension Take 5 mLs by mouth every 3 (three) hours as needed. Swish and spit; for throat/mouth pain (Patient not taking: Reported on 11/23/2021)     morphine (MS CONTIN) 15 MG 12 hr tablet Take 1 tablet (15 mg total) by mouth every 12 (twelve) hours. 60 tablet 0   nitroGLYCERIN (NITROSTAT) 0.4 MG SL tablet Place 0.4 mg under the tongue every 5 (five) minutes as needed for chest pain. (Patient not taking: Reported on 11/23/2021)     nystatin (MYCOSTATIN) 100000 UNIT/ML suspension Take 5 mLs (500,000 Units total) by mouth 4 (four) times daily. (Patient not taking: Reported on 11/23/2021) 473 mL 0   ondansetron (ZOFRAN) 4 MG tablet Take 1 tablet (4 mg total) by mouth every 4 (four) hours as needed for nausea. 90 tablet 3   Oxycodone HCl 10 MG TABS Take 1 tablet (10 mg total) by mouth every 4 (four) hours as  needed. 120 tablet 0   OZEMPIC, 0.25 OR 0.5 MG/DOSE, 2 MG/1.5ML SOPN Inject into the skin.     potassium chloride SA (KLOR-CON M) 20 MEQ tablet Take 1 tablet (20 mEq total) by mouth 2 (two) times daily. 60 tablet 0   prochlorperazine (COMPAZINE) 10 MG tablet Take 1 tablet (10 mg total) by mouth every 6 (six) hours as needed for nausea or vomiting. 90 tablet 3   topiramate (TOPAMAX) 50 MG tablet Take 50 mg by mouth daily.     valsartan (DIOVAN) 40 MG tablet Take 1 tablet (40 mg total) by mouth daily. 90 tablet 3   Vitamin D, Ergocalciferol, (DRISDOL) 1.25 MG (50000 UNIT) CAPS capsule Take 50,000 Units by mouth once a week.     Current Facility-Administered Medications  Medication Dose Route Frequency Provider Last Rate Last Admin   0.9 %  sodium chloride infusion  500 mL Intravenous Once Jackquline Denmark, MD       technetium sestamibi generic (CARDIOLITE) injection 09.2 millicurie  33.0 millicurie Intravenous Once PRN  Revankar, Reita Cliche, MD        HISTORY OF PRESENT ILLNESS:   Oncology History  Malignant neoplasm of upper-outer quadrant of left female breast ([place])  07/23/2013 Initial Diagnosis   Malignant neoplasm of upper-outer quadrant of left female breast ([place])    07/23/2013 Cancer Staging   Staging form: Breast, AJCC 7th Edition - Clinical stage from 07/23/2013: Stage IIB (T2, N1, M0) - Signed by Derwood Kaplan, MD on 07/04/2020 Prognostic indicators: Pos LVI    09/22/2021 -  Chemotherapy   Patient is on Treatment Plan : BREAST DOCEtaxel + Trastuzumab + Pertuzumab (THP) q21d x 8 cycles / Trastuzumab + Pertuzumab q21d x 4 cycles       Malignant neoplasm metastatic to bone ([place])  06/28/2021 Initial Diagnosis   Bone metastases ([place])    09/22/2021 -  Chemotherapy   Patient is on Treatment Plan : BREAST DOCEtaxel + Trastuzumab + Pertuzumab (THP) q21d x 8 cycles / Trastuzumab + Pertuzumab q21d x 4 cycles           REVIEW OF SYSTEMS:   Constitutional: Denies fevers, chills or abnormal weight loss Eyes: Denies blurriness of vision Ears, nose, mouth, throat, and face: Denies mucositis or sore throat Respiratory: Denies cough, dyspnea or wheezes Cardiovascular: Denies palpitation, chest discomfort or lower extremity swelling Gastrointestinal:  Denies nausea, heartburn or change in bowel habits Skin: Denies abnormal skin rashes Lymphatics: Denies new lymphadenopathy or easy bruising Neurological:Denies numbness, tingling or new weaknesses Behavioral/Psych: Mood is stable, no new changes  All other systems were reviewed with the patient and are negative.   VITALS:  Blood pressure 107/66, pulse (!) 120, temperature 97.9 F (36.6 C), temperature source Oral, resp. rate 18, height 4' 10.3" (1.481 m), weight 163 lb (73.9 kg), last menstrual period 08/18/2013, SpO2 95 %.  Wt Readings from Last 3 Encounters:  12/20/21 163 lb (73.9 kg)  12/14/21 160 lb 6.4 oz (72.8 kg)  11/24/21 162 lb  (73.5 kg)    Body mass index is 33.72 kg/m.  Performance status (ECOG): 1 - Symptomatic but completely ambulatory  PHYSICAL EXAM:   GENERAL:alert, no distress and comfortable SKIN: skin color, texture, turgor are normal, no rashes or significant lesions EYES: normal, Conjunctiva are pink and non-injected, sclera clear OROPHARYNX:no exudate, no erythema and lips, buccal mucosa, and tongue normal  NECK: supple, thyroid normal size, non-tender, without nodularity LYMPH:  no palpable lymphadenopathy in the cervical, axillary or inguinal  LUNGS: clear to auscultation and percussion with normal breathing effort HEART: regular rate & rhythm and no murmurs and no lower extremity edema ABDOMEN:abdomen soft, non-tender and normal bowel sounds Musculoskeletal:no cyanosis of digits and no clubbing  NEURO: alert & oriented x 3 with fluent speech, no focal motor/sensory deficits  LABORATORY DATA:  I have reviewed the data as listed    Component Value Date/Time   NA 140 12/20/2021 0000   K 3.7 12/20/2021 0000   CL 103 12/20/2021 0000   CO2 29 (A) 12/20/2021 0000   GLUCOSE 147 (H) 07/05/2021 0330   BUN 23 (A) 12/20/2021 0000   CREATININE 0.7 12/20/2021 0000   CREATININE 0.71 07/05/2021 0330   CALCIUM 8.9 12/20/2021 0000   PROT 7.7 06/30/2021 0833   ALBUMIN 3.6 12/20/2021 0000   AST 24 12/20/2021 0000   ALT 16 12/20/2021 0000   ALKPHOS 84 12/20/2021 0000   BILITOT 0.7 06/30/2021 0833   GFRNONAA >60 07/05/2021 0330    No results found for: SPEP, UPEP  Lab Results  Component Value Date   WBC 7.7 12/20/2021   NEUTROABS 5.62 12/20/2021   HGB 10.5 (A) 12/20/2021   HCT 34 (A) 12/20/2021   MCV 79 (A) 09/06/2021   PLT 363 12/20/2021      Chemistry      Component Value Date/Time   NA 140 12/20/2021 0000   K 3.7 12/20/2021 0000   CL 103 12/20/2021 0000   CO2 29 (A) 12/20/2021 0000   BUN 23 (A) 12/20/2021 0000   CREATININE 0.7 12/20/2021 0000   CREATININE 0.71 07/05/2021 0330    GLU 89 12/20/2021 0000      Component Value Date/Time   CALCIUM 8.9 12/20/2021 0000   ALKPHOS 84 12/20/2021 0000   AST 24 12/20/2021 0000   ALT 16 12/20/2021 0000   BILITOT 0.7 06/30/2021 0931       RADIOGRAPHIC STUDIES: I have personally reviewed the radiological images as listed and agreed with the findings in the report. No results found.

## 2021-12-20 NOTE — Assessment & Plan Note (Signed)
Large wound approximately 10 inches to left lower extremity. This is weeping and appears ulcerated. She also has a similar, but smaller wound noted to her right lower extremity. These began as small "blisters" and have progressed. She has been on Keflex without improvement.?Culture was obtained and the wound was growing E. coli. ?Her antibiotic was switched to Levaquin and she seemed to improve. After treatment, she noticed the leg becoming worse again. We restarted antibiotics and referred her to the wound clinic. She was evaluated and found to have a pressure ulcer to her left heel that will continue to be treated by the wound clinic. However, they are unsure as to what to do with the leg wound and recommended dermatology. Right now her leg is wrapped with xeroform, ABD pads and kerlex. They have supplies at home to continue this. Her husband states the skin continues to slough off, but the underlying skin appears normal. She will meet with dermatology this week.  ?

## 2021-12-21 ENCOUNTER — Encounter: Payer: Self-pay | Admitting: Oncology

## 2021-12-22 ENCOUNTER — Other Ambulatory Visit: Payer: Self-pay

## 2021-12-22 ENCOUNTER — Other Ambulatory Visit: Payer: Self-pay | Admitting: Hematology and Oncology

## 2021-12-22 ENCOUNTER — Inpatient Hospital Stay: Payer: BC Managed Care – PPO

## 2021-12-22 VITALS — BP 129/74 | HR 113 | Temp 98.2°F | Resp 16 | Ht 58.3 in | Wt 161.2 lb

## 2021-12-22 DIAGNOSIS — C7951 Secondary malignant neoplasm of bone: Secondary | ICD-10-CM

## 2021-12-22 DIAGNOSIS — C50412 Malignant neoplasm of upper-outer quadrant of left female breast: Secondary | ICD-10-CM | POA: Diagnosis present

## 2021-12-22 DIAGNOSIS — Z5189 Encounter for other specified aftercare: Secondary | ICD-10-CM | POA: Diagnosis not present

## 2021-12-22 DIAGNOSIS — Z5111 Encounter for antineoplastic chemotherapy: Secondary | ICD-10-CM | POA: Diagnosis present

## 2021-12-22 DIAGNOSIS — Z5112 Encounter for antineoplastic immunotherapy: Secondary | ICD-10-CM | POA: Diagnosis present

## 2021-12-22 DIAGNOSIS — Z79899 Other long term (current) drug therapy: Secondary | ICD-10-CM | POA: Diagnosis not present

## 2021-12-22 DIAGNOSIS — Z170 Estrogen receptor positive status [ER+]: Secondary | ICD-10-CM

## 2021-12-22 MED ORDER — ACETAMINOPHEN 325 MG PO TABS
650.0000 mg | ORAL_TABLET | Freq: Once | ORAL | Status: AC
  Administered 2021-12-22: 650 mg via ORAL
  Filled 2021-12-22: qty 2

## 2021-12-22 MED ORDER — PALONOSETRON HCL INJECTION 0.25 MG/5ML
0.2500 mg | Freq: Once | INTRAVENOUS | Status: AC
  Administered 2021-12-22: 0.25 mg via INTRAVENOUS
  Filled 2021-12-22: qty 5

## 2021-12-22 MED ORDER — SODIUM CHLORIDE 0.9 % IV SOLN
60.0000 mg/m2 | Freq: Once | INTRAVENOUS | Status: AC
  Administered 2021-12-22: 110 mg via INTRAVENOUS
  Filled 2021-12-22: qty 11

## 2021-12-22 MED ORDER — TRASTUZUMAB-DKST CHEMO 150 MG IV SOLR
6.0000 mg/kg | Freq: Once | INTRAVENOUS | Status: AC
  Administered 2021-12-22: 462 mg via INTRAVENOUS
  Filled 2021-12-22: qty 22

## 2021-12-22 MED ORDER — SODIUM CHLORIDE 0.9% FLUSH
10.0000 mL | INTRAVENOUS | Status: DC | PRN
  Administered 2021-12-22: 10 mL

## 2021-12-22 MED ORDER — SODIUM CHLORIDE 0.9 % IV SOLN
420.0000 mg | Freq: Once | INTRAVENOUS | Status: AC
  Administered 2021-12-22: 420 mg via INTRAVENOUS
  Filled 2021-12-22: qty 14

## 2021-12-22 MED ORDER — SODIUM CHLORIDE 0.9 % IV SOLN: Freq: Once | INTRAVENOUS | Status: AC

## 2021-12-22 MED ORDER — DIPHENHYDRAMINE HCL 25 MG PO CAPS
50.0000 mg | ORAL_CAPSULE | Freq: Once | ORAL | Status: AC
  Administered 2021-12-22: 50 mg via ORAL
  Filled 2021-12-22: qty 2

## 2021-12-22 MED ORDER — SODIUM CHLORIDE 0.9 % IV SOLN
10.0000 mg | Freq: Once | INTRAVENOUS | Status: AC
  Administered 2021-12-22: 10 mg via INTRAVENOUS
  Filled 2021-12-22: qty 10

## 2021-12-22 MED ORDER — HEPARIN SOD (PORK) LOCK FLUSH 100 UNIT/ML IV SOLN
500.0000 [IU] | Freq: Once | INTRAVENOUS | Status: AC | PRN
  Administered 2021-12-22: 500 [IU]

## 2021-12-22 NOTE — Progress Notes (Signed)
1333:PT STABLE AT TIME OF DISCHARGE ?

## 2021-12-22 NOTE — Patient Instructions (Signed)
[facility]  Discharge Instructions: ?Thank you for choosing [facility] to provide your oncology and hematology care.  ?If you have a lab appointment with the [facility], please go directly to the [facility] and check in at the registration area. ?  ?Wear comfortable clothing and clothing appropriate for easy access to any Portacath or PICC line.  ? ?We strive to give you quality time with your provider. You may need to reschedule your appointment if you arrive late (15 or more minutes).  Arriving late affects you and other patients whose appointments are after yours.  Also, if you miss three or more appointments without notifying the office, you may be dismissed from the clinic at the provider?s discretion.    ?  ?For prescription refill requests, have your pharmacy contact our office and allow 72 hours for refills to be completed.   ? ?Today you received the following chemotherapy and/or immunotherapy agents Docetaxel,Trastuzumab,Pertuzumab   ?  ?To help prevent nausea and vomiting after your treatment, we encourage you to take your nausea medication as directed. ? ?BELOW ARE SYMPTOMS THAT SHOULD BE REPORTED IMMEDIATELY: ?*FEVER GREATER THAN 100.4 F (38 ?C) OR HIGHER ?*CHILLS OR SWEATING ?*NAUSEA AND VOMITING THAT IS NOT CONTROLLED WITH YOUR NAUSEA MEDICATION ?*UNUSUAL SHORTNESS OF BREATH ?*UNUSUAL BRUISING OR BLEEDING ?*URINARY PROBLEMS (pain or burning when urinating, or frequent urination) ?*BOWEL PROBLEMS (unusual diarrhea, constipation, pain near the anus) ?TENDERNESS IN MOUTH AND THROAT WITH OR WITHOUT PRESENCE OF ULCERS (sore throat, sores in mouth, or a toothache) ?UNUSUAL RASH, SWELLING OR PAIN  ?UNUSUAL VAGINAL DISCHARGE OR ITCHING  ? ?Items with * indicate a potential emergency and should be followed up as soon as possible or go to the Emergency Department if any problems should occur. ? ?Please show the CHEMOTHERAPY ALERT CARD or IMMUNOTHERAPY ALERT CARD  at check-in to the Emergency Department and triage nurse. ? ?Should you have questions after your visit or need to cancel or reschedule your appointment, please contact [facility]  Dept: [phone]  and follow the prompts.  Office hours are 8:00 a.m. to 4:30 p.m. Monday - Friday. Please note that voicemails left after 4:00 p.m. may not be returned until the following business day.  We are closed weekends and major holidays. You have access to a nurse at all times for urgent questions. Please call the main number to the clinic Dept: [phone] and follow the prompts. ? ?For any non-urgent questions, you may also contact your provider using MyChart. We now offer e-Visits for anyone 78 and older to request care online for non-urgent symptoms. For details visit [email or web address]. ?  ?Also download the MyChart app! Go to the app store, search "MyChart", open the app, select [facility], and log in with your MyChart username and password. ? ?Due to Covid, a mask is required upon entering the hospital/clinic. If you do not have a mask, one will be given to you upon arrival. For doctor visits, patients may have 1 support person aged 6 or older with them. For treatment visits, patients cannot have anyone with them due to current Covid guidelines and our immunocompromised population.  ? ? ?

## 2021-12-23 LAB — THYROID PANEL WITH TSH
Free Thyroxine Index: 2.3 (ref 1.2–4.9)
T3 Uptake Ratio: 28 % (ref 24–39)
T4, Total: 8.1 ug/dL (ref 4.5–12.0)
TSH: 2.56 u[IU]/mL (ref 0.450–4.500)

## 2021-12-25 ENCOUNTER — Inpatient Hospital Stay: Payer: BC Managed Care – PPO

## 2021-12-25 VITALS — BP 125/89 | HR 110 | Temp 98.1°F | Resp 21 | Ht 58.3 in | Wt 163.8 lb

## 2021-12-25 DIAGNOSIS — C7951 Secondary malignant neoplasm of bone: Secondary | ICD-10-CM

## 2021-12-25 DIAGNOSIS — Z5111 Encounter for antineoplastic chemotherapy: Secondary | ICD-10-CM | POA: Diagnosis not present

## 2021-12-25 DIAGNOSIS — C50412 Malignant neoplasm of upper-outer quadrant of left female breast: Secondary | ICD-10-CM

## 2021-12-25 MED ORDER — PEGFILGRASTIM-CBQV 6 MG/0.6ML ~~LOC~~ SOSY
6.0000 mg | PREFILLED_SYRINGE | Freq: Once | SUBCUTANEOUS | Status: AC
  Administered 2021-12-25: 6 mg via SUBCUTANEOUS
  Filled 2021-12-25: qty 0.6

## 2021-12-25 NOTE — Patient Instructions (Signed)

## 2021-12-29 ENCOUNTER — Other Ambulatory Visit: Payer: Self-pay | Admitting: Hematology and Oncology

## 2021-12-29 ENCOUNTER — Telehealth: Payer: Self-pay

## 2021-12-29 ENCOUNTER — Ambulatory Visit: Payer: BC Managed Care – PPO

## 2021-12-29 MED ORDER — GELCLAIR MT GEL: 1.0000 | Freq: Three times a day (TID) | OROMUCOSAL | 0 refills | Status: DC

## 2021-12-29 MED ORDER — FLUCONAZOLE 100 MG PO TABS: 100.0000 mg | ORAL_TABLET | Freq: Every day | ORAL | 0 refills | Status: DC

## 2021-12-29 NOTE — Telephone Encounter (Addendum)
Melissa,NP: I have not used that, but definitely worth a try! I'll send that in and diflucan.  Ulice Dash, RPH:  They have this product called gelclair.  Have you ever used that?  Sometimes it helps if everything else doesn't.  My other thought would be a few doses of diflucan.   RE: Unrelieved moouth sores/pain Received: Today Melodye Ped, NP  Dairl Ponder, RN I'm asking Manuela Schwartz about a dex mouth wash         Pt's spouse called to ask if there is anything else they can use to help her mouth pain? They are using MMW and alternating w/nystatin swish. He states "I actually think her mouth has gotten worse since she started the MMW. Is there anything else we could try so maybe she can eat some over the weekend. I forwarded the call to [facility] and sent this message as well @ [phone]

## 2022-01-09 ENCOUNTER — Other Ambulatory Visit: Payer: Self-pay | Admitting: Hematology and Oncology

## 2022-01-09 DIAGNOSIS — Z299 Encounter for prophylactic measures, unspecified: Secondary | ICD-10-CM

## 2022-01-10 ENCOUNTER — Inpatient Hospital Stay: Payer: BC Managed Care – PPO

## 2022-01-10 ENCOUNTER — Encounter: Payer: Self-pay | Admitting: Oncology

## 2022-01-10 ENCOUNTER — Inpatient Hospital Stay (INDEPENDENT_AMBULATORY_CARE_PROVIDER_SITE_OTHER): Payer: BC Managed Care – PPO | Admitting: Oncology

## 2022-01-10 ENCOUNTER — Other Ambulatory Visit: Payer: Self-pay | Admitting: Oncology

## 2022-01-10 VITALS — BP 97/63 | HR 132 | Temp 97.7°F | Resp 20 | Ht 59.0 in | Wt 149.7 lb

## 2022-01-10 DIAGNOSIS — C7951 Secondary malignant neoplasm of bone: Secondary | ICD-10-CM | POA: Diagnosis not present

## 2022-01-10 DIAGNOSIS — C50412 Malignant neoplasm of upper-outer quadrant of left female breast: Secondary | ICD-10-CM

## 2022-01-10 DIAGNOSIS — Z170 Estrogen receptor positive status [ER+]: Secondary | ICD-10-CM

## 2022-01-10 DIAGNOSIS — M542 Cervicalgia: Secondary | ICD-10-CM

## 2022-01-10 LAB — CBC AND DIFFERENTIAL
HCT: 36 (ref 36–46)
Hemoglobin: 10.8 — AB (ref 12.0–16.0)
Neutrophils Absolute: 9.79
Platelets: 386 10*3/uL (ref 150–400)
WBC: 11.8

## 2022-01-10 LAB — HEPATIC FUNCTION PANEL
ALT: 21 U/L (ref 7–35)
AST: 26 (ref 13–35)
Alkaline Phosphatase: 106 (ref 25–125)
Bilirubin, Total: 0.4

## 2022-01-10 LAB — MAGNESIUM: Magnesium: 2.1

## 2022-01-10 LAB — BASIC METABOLIC PANEL
BUN: 11 (ref 4–21)
CO2: 28 — AB (ref 13–22)
Chloride: 105 (ref 99–108)
Creatinine: 0.6 (ref 0.5–1.1)
Glucose: 101
Potassium: 2.9 mEq/L — AB (ref 3.5–5.1)
Sodium: 143 (ref 137–147)

## 2022-01-10 LAB — COMPREHENSIVE METABOLIC PANEL
Albumin: 3.6 (ref 3.5–5.0)
Calcium: 8.9 (ref 8.7–10.7)

## 2022-01-10 LAB — CBC: RBC: 4.28 (ref 3.87–5.11)

## 2022-01-10 NOTE — Progress Notes (Signed)
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 She was on adjuvant hormonal therapy with tamoxifen 20 mg daily from October 2015 to May 2020, but due to elevation of the liver transaminases was switched to anastrazole.   Malignant neoplasm metastatic to bone [facility]) New bone metastases, November 2022, with multiple marrow replacing lesions within the proximal right femur within the ischium as well as the inferior rami. 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 Ki67 was 60%. PET imaging from December 19th revealed dominant finding of intensely hypermetabolic aggressive skeletal metastasis involving the calvarium, sternum, thoracic and lumbar spine, and pelvis, with potential pathologic fracture of the right femur with internal fixation. There is soft tissue extension into the anterior mediastinum associated with the manubrial metastasis. Multiple spinal vertebral body lesions have cortical destruction along the posterior margin. She started monthly Xgeva in December, and recently completed radiation therapy. She is now receiving THP and will plan for hormonal therapy.  Cycle 4 was delayed and dose reduction made by 20% due to left leg infection. This is being wrapped daily under the care of the wound clinic with Dr. Coralie Keens. She has appointment with  dermatology this week.  We will hold cycle 6 due to poor healing of her leg and we may not even give it at all.  We will see her back in clinic in 2 weeks for repeat evaluation.    Leg wound, left Large wound approximately 10 inches of left lower extremity. This is weeping and appears ulcerated. She also has a similar, but smaller wound noted to her right lower extremity. These began as small "blisters" and have progressed. She has been on Keflex without improvement. Culture was obtained and the wound was growing E. coli.  Her antibiotic was switched to Levaquin and she seemed to improve. After treatment, she noticed the leg becoming worse again. We restarted antibiotics and referred her to the wound clinic. She was evaluated and found to also have a pressure ulcer to her left heel that will continue to be treated by the wound clinic. However, they are unsure as to what to do with the leg wound and recommended dermatology. Right now her leg is wrapped with xeroform, ABD pads and kerlex. They have supplies at home to continue this. Her husband states the skin continues to slough off, but the underlying skin appears normal. She will meet with dermatology this week.  New neck pain and stiffness She is now having problems with stiffness of her neck and pain that she rates as a 7 out of 10.  She is having trouble holding her neck up.  I will schedule her for MRI of the cervical spine and brain.  I will refer her to Dr. Cay Schillings, her orthopedic surgeon, for his assistance.   Severe hypokalemia I do not think she could tolerate enough oral potassium and so I will arrange for her to get IV  potassium 30 mEq on Friday.  We will have her increase her potassium supplement to 3 daily.   Her leg wound continues to be a problem but are somewhat better.  She also developed a severe pressure ulcer of the left heel and the wound center is helping her with that she comes in with a pressure sore of her left elbow as well  now and is unable to hold her neck up properly.  I think all of these issues need to be dealt with and so we will put chemotherapy on hold.  I will refer her to Dr. Cay Schillings for his advice regarding the neck and where to go from here.  We need to continue the denosumab and so she will get that on June 2 as well as IV potassium.  We will see her back in 2 weeks with CBC and CMP for reevaluation.  I will schedule MRI scans of the cervical spine and brain.  She will be seen by orthopedics.  We will hold her chemotherapy but eventually plan to place her on maintenance trastuzumab and pertuzumab.  At that time, we can also plan to start an alternate hormonal therapy, since this metastatic disease occurred while she was on adjuvant anastrozole.The patient and her husband understand the plans discussed [name] and are in agreement with them.  She knows to contact our office if she develops concerns prior to her next appointment.     Derwood Kaplan, MD  [place] [address] [place] [address] Dept: [phone] Dept Fax: [phone]       CHIEF COMPLAINT:  CC: A 51 year old female with history of breast cancer here for 3 week evaluation  Current Treatment:  Trastuzumab/ Pertuzumab  INTERVAL HISTORY:  Anna [name] for repeat clinical assessment.  She has multiple issues but the new problem is the fact that her neck is very stiff and she is having difficulty holding it up.  This is associated with pain of the back and neck that she rates as a 7 out of 10 and has been going on for the last 3 days.  I am not sure what is causing this but have seen this reported with gout, and she does have a history of gouty arthritis but not in the last 6 months.  She is still dealing with the leg wounds but they are somewhat better and she will be seeing the dermatologist tomorrow.  She continues to go to the wound center and has a  pressure ulcer of her left heel which is quite severe.  She has also now developed a pressure ulcer of her left elbow.  She complains of mouth sores and the Magic mouthwash helps somewhat.  She is having anxiety but her medications help.  She is using a walker to get around and appears extremely debilitated [name] and dyspneic with exertion.  Her fifth cycle was given 3 weeks ago with a 20% dose reduction but at this time I plan to hold further chemotherapy and will likely stop it without giving her the sixth cycle due to the many toxicities, including severe weight loss.  We will still plan on maintenance trastuzumab and epratuzumab once she is stabilized.  Her potassium is extremely low at 2.9 and so we will arrange for her to get IV potassium with her denosumab on Friday.  Her hemoglobin is 10.8, relatively stable. She denies fevers or chills. She denies pain. Her  appetite is poor. Her weight has decreased by 13 pounds  I have reviewed the past medical history, past surgical history, social history and family history with the patient and they are unchanged from previous note.  ALLERGIES:  has No Known Allergies.  MEDICATIONS:  Current Outpatient Medications  Medication Sig Dispense Refill   magic mouthwash (lidocaine, diphenhydrAMINE, alum & mag hydroxide) suspension Take 5 mLs by mouth every 3 (three) hours as needed. Swish and spit; for throat/mouth pain     albuterol (VENTOLIN HFA) 108 (90 Base) MCG/ACT inhaler Inhale 2 puffs into the lungs every 6 (six) hours as needed for shortness of breath.     ARIPiprazole (ABILIFY) 5 MG tablet Take 2.5 mg by mouth daily.     aspirin 81 MG EC tablet Take 81 mg by mouth daily. Swallow whole.     atorvastatin (LIPITOR) 20 MG tablet Take 1 tablet (20 mg total) by mouth daily. 90 tablet 3   calcium carbonate (OS-CAL) 1250 (500 Ca) MG chewable tablet Chew 6 tablets by mouth daily.     desvenlafaxine (PRISTIQ) 50 MG 24 hr tablet Take 50 mg by mouth daily.      dexamethasone (DECADRON) 4 MG tablet TAKE TWO TABLETS BY MOUTH 2 TIMES A DAY BEGINNING THE DAY BEFORE CHEMOTHERAPY AND FOR 2 DAYS AFTER EVERY 3 WEEKS 60 tablet 1   diphenoxylate-atropine (LOMOTIL) 2.5-0.025 MG tablet TAKE TWO TABLETS BY MOUTH FOUR TIMES A DAY AS NEEDED FOR DIARRHEA OR LOOSE STOOLS 100 tablet 1   FARXIGA 10 MG TABS tablet Take 1 tablet (10 mg total) by mouth daily. 90 tablet 3   fluconazole (DIFLUCAN) 100 MG tablet Take 1 tablet (100 mg total) by mouth daily. 5 tablet 0   fluticasone (FLONASE) 50 MCG/ACT nasal spray Place 1 spray into both nostrils as needed.     labetalol (NORMODYNE) 200 MG tablet Take 1 tablet (200 mg total) by mouth 2 (two) times daily. 180 tablet 3   Loratadine 10 MG CAPS Take 1 capsule (10 mg total) by mouth daily. 30 capsule 6   LORazepam (ATIVAN) 1 MG tablet Take 1 tablet (1 mg total) by mouth every 8 (eight) hours. 30 tablet 0   morphine (MS CONTIN) 15 MG 12 hr tablet Take 1 tablet (15 mg total) by mouth every 12 (twelve) hours. 60 tablet 0   nitroGLYCERIN (NITROSTAT) 0.4 MG SL tablet Place 0.4 mg under the tongue every 5 (five) minutes as needed for chest pain. (Patient not taking: Reported on 11/23/2021)     nystatin (MYCOSTATIN) 100000 UNIT/ML suspension Take 5 mLs (500,000 Units total) by mouth 4 (four) times daily. (Patient not taking: Reported on 11/23/2021) 473 mL 0   ondansetron (ZOFRAN) 4 MG tablet Take 1 tablet (4 mg total) by mouth every 4 (four) hours as needed for nausea. 90 tablet 3   Oxycodone HCl 10 MG TABS Take 1 tablet (10 mg total) by mouth every 4 (four) hours as needed. 120 tablet 0   OZEMPIC, 0.25 OR 0.5 MG/DOSE, 2 MG/1.5ML SOPN Inject into the skin.     potassium chloride SA (KLOR-CON M) 20 MEQ tablet Take 1 tablet (20 mEq total) by mouth 2 (two) times daily. 60 tablet 0   prochlorperazine (COMPAZINE) 10 MG tablet Take 1 tablet (10 mg total) by mouth every 6 (six) hours as needed for nausea or vomiting. 90 tablet 3   topiramate (TOPAMAX)  50 MG tablet Take 50 mg by mouth daily.     valsartan (DIOVAN) 40  MG tablet Take 1 tablet (40 mg total) by mouth daily. 90 tablet 3   Vitamin D, Ergocalciferol, (DRISDOL) 1.25 MG (50000 UNIT) CAPS capsule Take 50,000 Units by mouth once a week.     Current Facility-Administered Medications  Medication Dose Route Frequency Provider Last Rate Last Admin   0.9 %  sodium chloride infusion  500 mL Intravenous Once Jackquline Denmark, MD       technetium sestamibi generic (CARDIOLITE) injection 63.8 millicurie  75.6 millicurie Intravenous Once PRN Revankar, Reita Cliche, MD        HISTORY OF PRESENT ILLNESS:   Oncology History  Malignant neoplasm of upper-outer quadrant of left female breast ([place])  07/23/2013 Initial Diagnosis   Malignant neoplasm of upper-outer quadrant of left female breast ([place])    07/23/2013 Cancer Staging   Staging form: Breast, AJCC 7th Edition - Clinical stage from 07/23/2013: Stage IIB (T2, N1, M0) - Signed by Derwood Kaplan, MD on 07/04/2020 Prognostic indicators: Pos LVI    09/22/2021 -  Chemotherapy   Patient is on Treatment Plan : BREAST DOCEtaxel + Trastuzumab + Pertuzumab (THP) q21d x 8 cycles / Trastuzumab + Pertuzumab q21d x 4 cycles      Malignant neoplasm metastatic to bone ([place])  06/28/2021 Initial Diagnosis   Bone metastases ([place])    09/22/2021 -  Chemotherapy   Patient is on Treatment Plan : BREAST DOCEtaxel + Trastuzumab + Pertuzumab (THP) q21d x 8 cycles / Trastuzumab + Pertuzumab q21d x 4 cycles          REVIEW OF SYSTEMS:   Constitutional: Denies fevers, chills or abnormal weight loss Eyes: Denies blurriness of vision Ears, nose, mouth, throat, and face: Denies mucositis or sore throat Respiratory: Denies cough, dyspnea or wheezes Cardiovascular: Denies palpitation, chest discomfort or lower extremity swelling Gastrointestinal:  Denies nausea, heartburn or change in bowel habits Skin: Denies abnormal skin rashes Lymphatics: Denies new  lymphadenopathy or easy bruising Neurological:Denies numbness, tingling or new weaknesses Musculoskeletal:  She has severe pain and weakness of her neck for the last 3 days Behavioral/Psych: Mood is stable, no new changes  All other systems were reviewed with the patient and are negative.   VITALS:  Blood pressure 97/63, pulse (!) 132, temperature 97.7 F (36.5 C), temperature source Oral, resp. rate 20, height 4' 11"  (1.499 m), weight 149 lb 11.2 oz (67.9 kg), last menstrual period 08/18/2013, SpO2 96 %.  Wt Readings from Last 3 Encounters:  01/10/22 149 lb 11.2 oz (67.9 kg)  12/25/21 163 lb 12 oz (74.3 kg)  12/22/21 161 lb 4 oz (73.1 kg)    Body mass index is 30.24 kg/m.  Performance status (ECOG): 1 - Symptomatic but completely ambulatory  PHYSICAL EXAM:   GENERAL:alert, no distress and comfortable SKIN: skin color, texture, turgor are normal, she has severe pressure sores of the left heel and left elbow She has persistent wounds of both lower legs of unknown etiology EYES: normal, Conjunctiva are pink and non-injected, sclera clear OROPHARYNX:no exudate, no erythema and lips, buccal mucosa, and tongue normal  NECK: supple, thyroid normal size, non-tender, without nodularity LYMPH:  no palpable lymphadenopathy in the cervical, axillary or inguinal LUNGS: clear to auscultation and percussion with normal breathing effort HEART: regular rate & rhythm and no murmurs and no lower extremity edema ABDOMEN:abdomen soft, non-tender and normal bowel sounds Musculoskeletal: She is unable to straighten her neck and it is extremely weak and painful NEURO: alert & oriented x 3 with fluent speech, no focal  motor/sensory deficits  LABORATORY DATA:  I have reviewed the data as listed    Component Value Date/Time   NA 143 01/10/2022 0000   K 2.9 (A) 01/10/2022 0000   CL 105 01/10/2022 0000   CO2 28 (A) 01/10/2022 0000   GLUCOSE 147 (H) 07/05/2021 0330   BUN 11 01/10/2022 0000    CREATININE 0.6 01/10/2022 0000   CREATININE 0.71 07/05/2021 0330   CALCIUM 8.9 01/10/2022 0000   PROT 7.7 06/30/2021 0833   ALBUMIN 3.6 01/10/2022 0000   AST 26 01/10/2022 0000   ALT 21 01/10/2022 0000   ALKPHOS 106 01/10/2022 0000   BILITOT 0.7 06/30/2021 0833   GFRNONAA >60 07/05/2021 0330    No results found for: SPEP, UPEP  Lab Results  Component Value Date   WBC 11.8 01/10/2022   NEUTROABS 9.79 01/10/2022   HGB 10.8 (A) 01/10/2022   HCT 36 01/10/2022   MCV 79 (A) 09/06/2021   PLT 386 01/10/2022      Chemistry      Component Value Date/Time   NA 143 01/10/2022 0000   K 2.9 (A) 01/10/2022 0000   CL 105 01/10/2022 0000   CO2 28 (A) 01/10/2022 0000   BUN 11 01/10/2022 0000   CREATININE 0.6 01/10/2022 0000   CREATININE 0.71 07/05/2021 0330   GLU 101 01/10/2022 0000      Component Value Date/Time   CALCIUM 8.9 01/10/2022 0000   ALKPHOS 106 01/10/2022 0000   AST 26 01/10/2022 0000   ALT 21 01/10/2022 0000   BILITOT 0.7 06/30/2021 0833       RADIOGRAPHIC STUDIES:  No results found.

## 2022-01-12 ENCOUNTER — Telehealth: Payer: Self-pay

## 2022-01-12 ENCOUNTER — Inpatient Hospital Stay: Payer: BC Managed Care – PPO | Attending: Oncology

## 2022-01-12 VITALS — BP 97/72 | HR 103 | Temp 98.2°F | Resp 20

## 2022-01-12 DIAGNOSIS — Z170 Estrogen receptor positive status [ER+]: Secondary | ICD-10-CM | POA: Diagnosis not present

## 2022-01-12 DIAGNOSIS — C7951 Secondary malignant neoplasm of bone: Secondary | ICD-10-CM | POA: Insufficient documentation

## 2022-01-12 DIAGNOSIS — Z5112 Encounter for antineoplastic immunotherapy: Secondary | ICD-10-CM | POA: Insufficient documentation

## 2022-01-12 DIAGNOSIS — C50412 Malignant neoplasm of upper-outer quadrant of left female breast: Secondary | ICD-10-CM | POA: Diagnosis present

## 2022-01-12 DIAGNOSIS — Z5111 Encounter for antineoplastic chemotherapy: Secondary | ICD-10-CM | POA: Diagnosis present

## 2022-01-12 MED ORDER — SODIUM CHLORIDE 0.9 % IV SOLN: Freq: Once | INTRAVENOUS | Status: AC

## 2022-01-12 MED ORDER — SODIUM CHLORIDE 0.9% FLUSH
10.0000 mL | INTRAVENOUS | Status: DC | PRN
  Administered 2022-01-12: 10 mL

## 2022-01-12 MED ORDER — POTASSIUM CHLORIDE 10 MEQ/100ML IV SOLN
10.0000 meq | INTRAVENOUS | Status: AC
  Administered 2022-01-12 (×3): 10 meq via INTRAVENOUS
  Filled 2022-01-12: qty 100

## 2022-01-12 MED ORDER — DENOSUMAB 120 MG/1.7ML ~~LOC~~ SOLN
120.0000 mg | Freq: Once | SUBCUTANEOUS | Status: AC
  Administered 2022-01-12: 120 mg via SUBCUTANEOUS
  Filled 2022-01-12: qty 1.7

## 2022-01-12 MED ORDER — HEPARIN SOD (PORK) LOCK FLUSH 100 UNIT/ML IV SOLN
500.0000 [IU] | Freq: Once | INTRAVENOUS | Status: AC | PRN
  Administered 2022-01-12: 500 [IU]

## 2022-01-12 NOTE — Telephone Encounter (Signed)
Faxed referral

## 2022-01-12 NOTE — Telephone Encounter (Signed)
-----   Message from Derwood Kaplan, MD sent at 01/10/2022  4:30 PM EDT ----- Regarding: refer Needs appt with Dr. Rich Brave with [facility] (didn't spell it right). For her neck.  We will get MRI but need his advice, he saw her originally back in November 2022

## 2022-01-14 ENCOUNTER — Encounter: Payer: Self-pay | Admitting: Oncology

## 2022-01-15 ENCOUNTER — Ambulatory Visit: Payer: BC Managed Care – PPO

## 2022-01-19 ENCOUNTER — Other Ambulatory Visit: Payer: Self-pay

## 2022-01-19 DIAGNOSIS — C7951 Secondary malignant neoplasm of bone: Secondary | ICD-10-CM

## 2022-01-19 DIAGNOSIS — R0789 Other chest pain: Secondary | ICD-10-CM

## 2022-01-19 DIAGNOSIS — M545 Low back pain, unspecified: Secondary | ICD-10-CM

## 2022-01-19 MED ORDER — OXYCODONE HCL 10 MG PO TABS: 10.0000 mg | ORAL_TABLET | ORAL | 0 refills | Status: DC | PRN

## 2022-01-22 ENCOUNTER — Other Ambulatory Visit: Payer: Self-pay | Admitting: Cardiology

## 2022-01-22 ENCOUNTER — Other Ambulatory Visit: Payer: Self-pay

## 2022-01-24 ENCOUNTER — Other Ambulatory Visit: Payer: Self-pay

## 2022-01-24 ENCOUNTER — Inpatient Hospital Stay (INDEPENDENT_AMBULATORY_CARE_PROVIDER_SITE_OTHER): Payer: BC Managed Care – PPO | Admitting: Hematology and Oncology

## 2022-01-24 ENCOUNTER — Inpatient Hospital Stay: Payer: BC Managed Care – PPO

## 2022-01-24 ENCOUNTER — Encounter: Payer: Self-pay | Admitting: Hematology and Oncology

## 2022-01-24 VITALS — BP 114/72 | HR 105 | Temp 98.2°F | Resp 18 | Ht 59.0 in | Wt 143.9 lb

## 2022-01-24 DIAGNOSIS — C7951 Secondary malignant neoplasm of bone: Secondary | ICD-10-CM

## 2022-01-24 DIAGNOSIS — Z170 Estrogen receptor positive status [ER+]: Secondary | ICD-10-CM

## 2022-01-24 DIAGNOSIS — M542 Cervicalgia: Secondary | ICD-10-CM | POA: Diagnosis not present

## 2022-01-24 DIAGNOSIS — S81802A Unspecified open wound, left lower leg, initial encounter: Secondary | ICD-10-CM | POA: Diagnosis not present

## 2022-01-24 DIAGNOSIS — Z5111 Encounter for antineoplastic chemotherapy: Secondary | ICD-10-CM | POA: Diagnosis not present

## 2022-01-24 DIAGNOSIS — C50412 Malignant neoplasm of upper-outer quadrant of left female breast: Secondary | ICD-10-CM

## 2022-01-24 LAB — BASIC METABOLIC PANEL
BUN: 18 (ref 4–21)
CO2: 28 — AB (ref 13–22)
Chloride: 103 (ref 99–108)
Creatinine: 0.6 (ref 0.5–1.1)
Glucose: 86
Potassium: 4.1 mEq/L (ref 3.5–5.1)
Sodium: 140 (ref 137–147)

## 2022-01-24 LAB — CBC AND DIFFERENTIAL
HCT: 38 (ref 36–46)
Hemoglobin: 11.5 — AB (ref 12.0–16.0)
Neutrophils Absolute: 5.85
Platelets: 280 10*3/uL (ref 150–400)
WBC: 7.8

## 2022-01-24 LAB — HEPATIC FUNCTION PANEL
ALT: 16 U/L (ref 7–35)
AST: 32 (ref 13–35)
Alkaline Phosphatase: 88 (ref 25–125)
Bilirubin, Total: 0.4

## 2022-01-24 LAB — COMPREHENSIVE METABOLIC PANEL
Albumin: 4 (ref 3.5–5.0)
Calcium: 9 (ref 8.7–10.7)

## 2022-01-24 LAB — CBC: RBC: 4.57 (ref 3.87–5.11)

## 2022-01-24 LAB — URIC ACID: Uric Acid, Serum: 3.2 mg/dL (ref 2.5–7.1)

## 2022-01-24 LAB — MAGNESIUM: Magnesium: 2.2

## 2022-01-24 NOTE — Assessment & Plan Note (Signed)
Large wound approximately 10 inches of left lower extremity. This is weeping and appears ulcerated. She also has a similar, but smaller wound noted to her right lower extremity. These began as small "blisters" and have progressed. She has been on Keflex without improvement.Culture was obtainedand the wound wasgrowing E. coli. Her antibiotic was switched to Levaquin and she seemed to improve. After treatment, she noticed the leg becoming worse again. We restarted antibiotics and referred her to the wound clinic. She was evaluated and found to also have a pressure ulcer to her left heel that will continue to be treated by the wound clinic. However, they are unsure as to what to do with the leg wound and recommended dermatology. Right now her leg is wrapped with xeroform, ABD pads and kerlex. They have supplies at home to continue this. Her husband states the skin continues to slough off, but the underlying skin appears normal.

## 2022-01-24 NOTE — Assessment & Plan Note (Signed)
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 She was on adjuvant hormonal therapy with tamoxifen 20 mg daily from October 2015 to May 2020, but due to elevation of the liver transaminases was switched to anastrazole.

## 2022-01-24 NOTE — Progress Notes (Signed)
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 She was on adjuvant hormonal therapy with tamoxifen 20 mg daily from October 2015 to May 2020, but due to elevation of the liver transaminases was switched to anastrazole.  Malignant neoplasm metastatic to bone [facility]) New bone metastases, November 2022, with multiple marrow replacing lesions within the proximal right femur within the ischium as well as the inferior rami. 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 Ki67 was 60%. PET imaging from December 19th revealed dominant finding of intensely hypermetabolic aggressive skeletal metastasis involving the calvarium, sternum, thoracic and lumbar spine, and pelvis, with potential pathologic fracture of the right femur with internal fixation. There is soft tissue extension into the anterior mediastinum associated with the manubrial metastasis. Multiple spinal vertebral body lesions have cortical destruction along the posterior margin. She started monthly Xgeva in December, and recently completed radiation therapy. She was receiving THP and will plan for hormonal therapy.  Cycle 4 was delayed and dose reduction made by 20% due to left leg infection. This is being wrapped daily under the care of the wound clinic with Dr. Coralie Keens. She continues to have leg wrapped  daily and infection was found to be staph. Her heel lesion is almost completely closed and she should be discharged as early as next week from the wound clinic. She will continue with Herceptin/ Perjeta, but we will cancel the chemotherapy. She will proceed with HER2 treatment Friday.  Leg wound, left Large wound approximately 10 inches of left lower extremity. This is weeping and appears ulcerated. She also has a similar, but smaller wound noted to her right lower extremity. These began as small "blisters" and have progressed. She has been on Keflex without improvement. Culture was obtained and the wound was growing E. coli.  Her antibiotic was switched to Levaquin and she seemed to improve. After treatment, she noticed the leg becoming worse again. We restarted antibiotics and referred her to the wound clinic. She was evaluated and found to also have a pressure ulcer to her left heel that will continue to be treated by the wound clinic. However, they are unsure as to what to do with the leg wound and recommended dermatology. Right now her leg is wrapped with xeroform, ABD pads and kerlex. They have supplies at home to continue this. Her husband states the skin continues to slough off, but the underlying skin appears normal.   Neck pain She is now having problems with stiffness of her neck and pain that she rates as a 7 out of 10.  She is having trouble holding her neck up. She was evaluated by Dr. Gladstone [facility]. He provided her with a cervical collar that she is wearing today. She states it is a little uncomfortable, but will be compliant with wearing it. Dr. Gladstone Lighter did not recommend MRI and patient canceled her upcoming appointment for Friday. We discussed rescheduling the  brain MRI as this was never done at initial evaluation. We will also obtain bone scan. I have encouraged her to take her extended release pain medication as she hasn't had it for several days.     The patient  understands the plans discussed today and is in agreement with them.  She knows to contact our office if she develops concerns prior to her next appointment.    Melodye Ped, NP  [facility] [address] [place] [address] Dept: [phone] Dept Fax: [phone]   Orders Placed This Encounter  Procedures   Uric acid    Standing Status:   Future    Number of Occurrences:   1    Standing Expiration Date:   01/24/2023   CBC and differential    This external order was created through the Results Console.   CBC    This external order was created through the Results Console.   Basic metabolic panel    This external order was created through the Results Console.   Comprehensive metabolic panel    This external order was created through the Results Console.   Hepatic function panel    This external order was created through the Results Console.   Magnesium    This order was created through External Result Entry      CHIEF COMPLAINT:  CC: A 51 year old female with history of breast cancer here for 1 week evaluation  Current Treatment:  THP; will proceed with HP  INTERVAL HISTORY:  Ahaana is here today for repeat clinical assessment. She denies fevers or chills. She denies pain. Her appetite is good. Her weight has been stable.  I have reviewed the past medical history, past surgical history, social history and family history with the patient and they are unchanged from previous note.  ALLERGIES:  has No Known Allergies.  MEDICATIONS:  Current Outpatient Medications  Medication Sig Dispense Refill   albuterol (VENTOLIN HFA) 108 (90 Base) MCG/ACT inhaler Inhale 2 puffs into the lungs every 6 (six) hours as needed for shortness of breath.     ARIPiprazole (ABILIFY) 5 MG tablet Take 2.5 mg by mouth daily.     aspirin 81 MG EC tablet Take 81 mg by mouth daily. Swallow whole.     atorvastatin (LIPITOR) 20  MG tablet Take 1 tablet (20 mg total) by mouth daily. 90 tablet 3   augmented betamethasone dipropionate (DIPROLENE-AF) 0.05 % cream betamethasone, augmented 0.05 % topical cream     calcium carbonate (OS-CAL) 1250 (500 Ca) MG chewable tablet Chew 6 tablets by mouth daily.     celecoxib (CELEBREX) 200 MG capsule celecoxib 200 mg capsule     CLENPIQ 10-3.5-12 MG-GM -GM/160ML SOLN      desvenlafaxine (PRISTIQ) 50 MG 24 hr tablet Take 50 mg by mouth daily.     dexamethasone (DECADRON) 4 MG tablet TAKE TWO TABLETS BY MOUTH 2 TIMES A DAY BEGINNING THE DAY BEFORE CHEMOTHERAPY AND FOR 2 DAYS AFTER EVERY 3 WEEKS 60 tablet 1   diphenoxylate-atropine (LOMOTIL) 2.5-0.025 MG tablet TAKE TWO TABLETS BY MOUTH FOUR TIMES A DAY AS NEEDED FOR DIARRHEA OR LOOSE STOOLS 100 tablet 1   FARXIGA 10 MG TABS tablet Take 1 tablet (10 mg total) by mouth daily. 30 tablet 11   fluconazole (DIFLUCAN) 100 MG tablet Take 1 tablet (100 mg total) by mouth daily. 5 tablet 0   labetalol (NORMODYNE) 200 MG tablet Take 1  tablet (200 mg total) by mouth 2 (two) times daily. 180 tablet 3   Loratadine 10 MG CAPS Take 1 capsule (10 mg total) by mouth daily. 30 capsule 6   LORazepam (ATIVAN) 1 MG tablet Take 1 tablet (1 mg total) by mouth every 8 (eight) hours. 30 tablet 0   magic mouthwash (lidocaine, diphenhydrAMINE, alum & mag hydroxide) suspension Take 5 mLs by mouth every 3 (three) hours as needed. Swish and spit; for throat/mouth pain     methocarbamol (ROBAXIN) 500 MG tablet methocarbamol 500 mg tablet  1 po q 6 hrs prn spasms     morphine (MS CONTIN) 15 MG 12 hr tablet Take 1 tablet (15 mg total) by mouth every 12 (twelve) hours. 60 tablet 0   morphine (MSIR) 15 MG tablet Take by mouth every 12 (twelve) hours as needed.     mupirocin ointment (BACTROBAN) 2 % mupirocin 2 % topical ointment     nitroGLYCERIN (NITROSTAT) 0.4 MG SL tablet Place 0.4 mg under the tongue every 5 (five) minutes as needed for chest pain. (Patient not taking:  Reported on 11/23/2021)     nystatin (MYCOSTATIN) 100000 UNIT/ML suspension Take 5 mLs (500,000 Units total) by mouth 4 (four) times daily. (Patient not taking: Reported on 11/23/2021) 473 mL 0   omeprazole (PRILOSEC) 40 MG capsule      ondansetron (ZOFRAN) 4 MG tablet Take 1 tablet (4 mg total) by mouth every 4 (four) hours as needed for nausea. 90 tablet 3   oxyCODONE (OXY IR/ROXICODONE) 5 MG immediate release tablet oxycodone 5 mg tablet     Oxycodone HCl 10 MG TABS Take 1 tablet (10 mg total) by mouth every 4 (four) hours as needed. 120 tablet 0   OZEMPIC, 0.25 OR 0.5 MG/DOSE, 2 MG/1.5ML SOPN Inject into the skin.     potassium chloride SA (KLOR-CON M) 20 MEQ tablet Take 1 tablet (20 mEq total) by mouth 2 (two) times daily. 60 tablet 0   prochlorperazine (COMPAZINE) 10 MG tablet Take 1 tablet (10 mg total) by mouth every 6 (six) hours as needed for nausea or vomiting. 90 tablet 3   topiramate (TOPAMAX) 50 MG tablet Take 50 mg by mouth daily.     traMADol (ULTRAM) 50 MG tablet      valsartan (DIOVAN) 40 MG tablet Take 1 tablet (40 mg total) by mouth daily. 90 tablet 3   Vitamin D, Ergocalciferol, (DRISDOL) 1.25 MG (50000 UNIT) CAPS capsule Take 50,000 Units by mouth once a week.     Current Facility-Administered Medications  Medication Dose Route Frequency Provider Last Rate Last Admin   0.9 %  sodium chloride infusion  500 mL Intravenous Once Jackquline Denmark, MD       technetium sestamibi generic (CARDIOLITE) injection 36.4 millicurie  68.0 millicurie Intravenous Once PRN Revankar, Reita Cliche, MD        HISTORY OF PRESENT ILLNESS:   Oncology History  Malignant neoplasm of upper-outer quadrant of left female breast ([place])  07/23/2013 Initial Diagnosis   Malignant neoplasm of upper-outer quadrant of left female breast ([place])   07/23/2013 Cancer Staging   Staging form: Breast, AJCC 7th Edition - Clinical stage from 07/23/2013: Stage IIB (T2, N1, M0) - Signed by Derwood Kaplan, MD on  07/04/2020 Prognostic indicators: Pos LVI   09/22/2021 -  Chemotherapy   Patient is on Treatment Plan : BREAST DOCEtaxel + Trastuzumab + Pertuzumab (THP) q21d x 8 cycles / Trastuzumab + Pertuzumab q21d x 4 cycles  Malignant neoplasm metastatic to bone ([place])  06/28/2021 Initial Diagnosis   Bone metastases ([place])   09/22/2021 -  Chemotherapy   Patient is on Treatment Plan : BREAST DOCEtaxel + Trastuzumab + Pertuzumab (THP) q21d x 8 cycles / Trastuzumab + Pertuzumab q21d x 4 cycles         REVIEW OF SYSTEMS:   Constitutional: Denies fevers, chills or abnormal weight loss Eyes: Denies blurriness of vision Ears, nose, mouth, throat, and face: Denies mucositis or sore throat Respiratory: Denies cough, dyspnea or wheezes Cardiovascular: Denies palpitation, chest discomfort or lower extremity swelling Gastrointestinal:  Denies nausea, heartburn or change in bowel habits Skin: Denies abnormal skin rashes Lymphatics: Denies new lymphadenopathy or easy bruising Neurological:Denies numbness, tingling or new weaknesses Behavioral/Psych: Mood is stable, no new changes  All other systems were reviewed with the patient and are negative.   VITALS:  Blood pressure 114/72, pulse (!) 105, temperature 98.2 F (36.8 C), temperature source Oral, resp. rate 18, height 4' 11"  (1.499 m), weight 143 lb 14.4 oz (65.3 kg), last menstrual period 08/18/2013, SpO2 96 %.  Wt Readings from Last 3 Encounters:  01/24/22 143 lb 14.4 oz (65.3 kg)  01/10/22 149 lb 11.2 oz (67.9 kg)  12/25/21 163 lb 12 oz (74.3 kg)    Body mass index is 29.06 kg/m.  Performance status (ECOG): 1 - Symptomatic but completely ambulatory  PHYSICAL EXAM:   GENERAL:alert, no distress and comfortable SKIN: skin color, texture, turgor are normal, no rashes or significant lesions EYES: normal, Conjunctiva are pink and non-injected, sclera clear OROPHARYNX:no exudate, no erythema and lips, buccal mucosa, and tongue normal  NECK:  supple, thyroid normal size, non-tender, without nodularity LYMPH:  no palpable lymphadenopathy in the cervical, axillary or inguinal LUNGS: clear to auscultation and percussion with normal breathing effort HEART: regular rate & rhythm and no murmurs and no lower extremity edema ABDOMEN:abdomen soft, non-tender and normal bowel sounds Musculoskeletal:no cyanosis of digits and no clubbing  NEURO: alert & oriented x 3 with fluent speech, no focal motor/sensory deficits  LABORATORY DATA:  I have reviewed the data as listed    Component Value Date/Time   NA 140 01/24/2022 0000   K 4.1 01/24/2022 0000   CL 103 01/24/2022 0000   CO2 28 (A) 01/24/2022 0000   GLUCOSE 147 (H) 07/05/2021 0330   BUN 18 01/24/2022 0000   CREATININE 0.6 01/24/2022 0000   CREATININE 0.71 07/05/2021 0330   CALCIUM 9.0 01/24/2022 0000   PROT 7.7 06/30/2021 0833   ALBUMIN 4.0 01/24/2022 0000   AST 32 01/24/2022 0000   ALT 16 01/24/2022 0000   ALKPHOS 88 01/24/2022 0000   BILITOT 0.7 06/30/2021 0833   GFRNONAA >60 07/05/2021 0330    No results found for: "SPEP", "UPEP"  Lab Results  Component Value Date   WBC 7.8 01/24/2022   NEUTROABS 5.85 01/24/2022   HGB 11.5 (A) 01/24/2022   HCT 38 01/24/2022   MCV 79 (A) 09/06/2021   PLT 280 01/24/2022      Chemistry      Component Value Date/Time   NA 140 01/24/2022 0000   K 4.1 01/24/2022 0000   CL 103 01/24/2022 0000   CO2 28 (A) 01/24/2022 0000   BUN 18 01/24/2022 0000   CREATININE 0.6 01/24/2022 0000   CREATININE 0.71 07/05/2021 0330   GLU 86 01/24/2022 0000      Component Value Date/Time   CALCIUM 9.0 01/24/2022 0000   ALKPHOS 88 01/24/2022 0000   AST  32 01/24/2022 0000   ALT 16 01/24/2022 0000   BILITOT 0.7 06/30/2021 0929       RADIOGRAPHIC STUDIES: I have personally reviewed the radiological images as listed and agreed with the findings in the report. No results found.

## 2022-01-24 NOTE — Assessment & Plan Note (Addendum)
New bone metastases, November 2022, with multiple marrow replacing lesions within the proximal right femur within the ischium as well as the inferior rami. 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 Ki67 was 60%. PET imaging from December 19th revealed dominant finding of intensely hypermetabolic aggressive skeletal metastasis involving the calvarium, sternum, thoracic and lumbar spine, and pelvis, with potential pathologic fracture of the right femur with internal fixation. There is soft tissue extension into the anterior mediastinum associated with the manubrial metastasis. Multiple spinal vertebral body lesions have cortical destruction along the posterior margin.She started monthly Xgeva in December, and recently completed radiation therapy.She was receivingTHP andwill plan forhormonal therapy.  Cycle 4 was delayed and dose reduction made by 20% due to left leg infection.This is being wrapped daily under the care of the wound clinic with Dr. Coralie Keens. She continues to have leg wrapped daily and infection was found to be staph. Her heel lesion is almost completely closed and she should be discharged as early as next week from the wound clinic. She will continue with Herceptin/ Perjeta, but we will cancel the chemotherapy. She will proceed with HER2 treatment Friday.

## 2022-01-24 NOTE — Assessment & Plan Note (Signed)
She is now having problems with stiffness of her neck and pain that she rates as a 7 out of 10.  She is having trouble holding her neck up. She was evaluated by Dr. Gladstone Lighter with [facility]. He provided her with a cervical collar that she is wearing today. She states it is a little uncomfortable, but will be compliant with wearing it. Dr. Gladstone Lighter did not recommend MRI and patient canceled her upcoming appointment for Friday. We discussed rescheduling the brain MRI as this was never done at initial evaluation. We will also obtain bone scan. I have encouraged her to take her extended release pain medication as she hasn't had it for several days.

## 2022-01-25 ENCOUNTER — Encounter: Payer: Self-pay | Admitting: Oncology

## 2022-01-26 ENCOUNTER — Telehealth (HOSPITAL_COMMUNITY): Payer: Self-pay | Admitting: *Deleted

## 2022-01-26 ENCOUNTER — Other Ambulatory Visit: Payer: Self-pay | Admitting: Hematology and Oncology

## 2022-01-26 NOTE — Telephone Encounter (Signed)
[facility] called and stated pt comes in at 0800 am this Monday morning, and they need an order for a nuclear medicine bone scan. Message sent to Ambrose Pancoast she stated Rhunette Croft made aware and will put the order in. Fax number given to Ambrose Pancoast for order to be sent.

## 2022-01-30 ENCOUNTER — Encounter: Payer: Self-pay | Admitting: Oncology

## 2022-01-30 ENCOUNTER — Encounter: Payer: Self-pay | Admitting: Hematology and Oncology

## 2022-02-01 ENCOUNTER — Encounter: Payer: Self-pay | Admitting: Oncology

## 2022-02-01 ENCOUNTER — Telehealth: Payer: Self-pay

## 2022-02-01 NOTE — Telephone Encounter (Signed)
-----   Message from Melodye Ped, NP sent at 02/01/2022 12:11 PM EDT ----- Regarding: RE: dexamethozone No, she no longer needs to. ----- Message ----- From: Georgette Shell, RN Sent: 02/01/2022  12:04 PM EDT To: Melodye Ped, NP Subject: dexamethozone                                  Does she still take her Dexamethasone prior to treatment?

## 2022-02-02 ENCOUNTER — Inpatient Hospital Stay: Payer: BC Managed Care – PPO

## 2022-02-02 VITALS — BP 134/85 | HR 97 | Temp 98.2°F | Resp 22 | Wt 147.1 lb

## 2022-02-02 DIAGNOSIS — Z5111 Encounter for antineoplastic chemotherapy: Secondary | ICD-10-CM | POA: Diagnosis not present

## 2022-02-02 DIAGNOSIS — C7951 Secondary malignant neoplasm of bone: Secondary | ICD-10-CM

## 2022-02-02 DIAGNOSIS — Z170 Estrogen receptor positive status [ER+]: Secondary | ICD-10-CM

## 2022-02-02 MED ORDER — SODIUM CHLORIDE 0.9 % IV SOLN: Freq: Once | INTRAVENOUS | Status: AC

## 2022-02-02 MED ORDER — SODIUM CHLORIDE 0.9 % IV SOLN
840.0000 mg | Freq: Once | INTRAVENOUS | Status: AC
  Administered 2022-02-02: 840 mg via INTRAVENOUS
  Filled 2022-02-02: qty 28

## 2022-02-02 MED ORDER — TRASTUZUMAB-DKST CHEMO 150 MG IV SOLR
8.0000 mg/kg | Freq: Once | INTRAVENOUS | Status: AC
  Administered 2022-02-02: 525 mg via INTRAVENOUS
  Filled 2022-02-02: qty 20

## 2022-02-02 MED ORDER — ACETAMINOPHEN 325 MG PO TABS
650.0000 mg | ORAL_TABLET | Freq: Once | ORAL | Status: AC
  Administered 2022-02-02: 650 mg via ORAL
  Filled 2022-02-02: qty 2

## 2022-02-02 MED ORDER — SODIUM CHLORIDE 0.9% FLUSH
10.0000 mL | INTRAVENOUS | Status: DC | PRN
  Administered 2022-02-02: 10 mL

## 2022-02-02 MED ORDER — HEPARIN SOD (PORK) LOCK FLUSH 100 UNIT/ML IV SOLN
500.0000 [IU] | Freq: Once | INTRAVENOUS | Status: AC | PRN
  Administered 2022-02-02: 500 [IU]

## 2022-02-02 MED ORDER — DIPHENHYDRAMINE HCL 25 MG PO CAPS
50.0000 mg | ORAL_CAPSULE | Freq: Once | ORAL | Status: AC
  Administered 2022-02-02: 50 mg via ORAL
  Filled 2022-02-02: qty 2

## 2022-02-05 ENCOUNTER — Other Ambulatory Visit: Payer: Self-pay

## 2022-02-05 ENCOUNTER — Other Ambulatory Visit: Payer: Self-pay | Admitting: Hematology and Oncology

## 2022-02-05 DIAGNOSIS — M25551 Pain in right hip: Secondary | ICD-10-CM

## 2022-02-05 DIAGNOSIS — C7951 Secondary malignant neoplasm of bone: Secondary | ICD-10-CM

## 2022-02-05 DIAGNOSIS — C50412 Malignant neoplasm of upper-outer quadrant of left female breast: Secondary | ICD-10-CM

## 2022-02-05 MED ORDER — MORPHINE SULFATE ER 15 MG PO TBCR: 15.0000 mg | EXTENDED_RELEASE_TABLET | Freq: Two times a day (BID) | ORAL | 0 refills | Status: DC

## 2022-02-08 NOTE — Progress Notes (Signed)
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 She was on adjuvant hormonal therapy with tamoxifen 20 mg daily from October 2015 to May 2020, but due to elevation of the liver transaminases, was switched to anastrazole.   Malignant neoplasm metastatic to bone [facility]) New bone metastases, November 2022, with multiple marrow replacing lesions within the proximal right femur within the ischium as well as the inferior rami. 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 Ki67 was 60%. PET imaging from December 19th revealed dominant finding of intensely hypermetabolic aggressive skeletal metastasis involving the calvarium, sternum, thoracic and lumbar spine, and pelvis, with potential pathologic fracture of the right femur with internal fixation. There is soft tissue extension into the anterior mediastinum associated with the manubrial metastasis. Multiple spinal vertebral body lesions have cortical destruction along the posterior margin. She started monthly Xgeva in December, and recently completed radiation therapy. She was receiving THP and will plan for hormonal therapy.  Cycle 4 was delayed and dose reduction made by 20% due to left leg infection. This is being wrapped daily under the care of the wound clinic with Anna [name]. She continues to have leg wrapped  daily and infection was found to be staph. Her heel lesion is almost completely closed and she should be discharged as early as next week from the wound clinic. She will continue with Herceptin/ Perjeta, but we will cancel the chemotherapy. She will proceed with HER2 treatment Friday.   Leg wound, left Large wound approximately 10 inches of left lower extremity. This is weeping and appears ulcerated. She also has a similar, but smaller wound noted to her right lower extremity. These began as small "blisters" and have progressed. She has been on Keflex without improvement. Culture was obtained and the wound was growing E. coli. This is finally healing.    Neck pain She is now having problems with stiffness of her neck and pain that she rates as a 7 out of 10.  She is having trouble holding her neck up. She was evaluated by Dr. Gladstone [name]. He provided her with a cervical collar that she is wearing today. She states it is a little uncomfortable, but will be compliant with wearing it. Dr. Gladstone [name] did not recommend MRI and patient canceled her upcoming appointment for Friday. I have encouraged her to take her extended release pain medication as she hasn't for several days.     She had Herceptin and Perjeta on 6/23 and will get Xgeva today and every 4 weeks. We will [facility] to [facility] for neck strengthening. I will see her back in 2 weeks prior to her next Herceptin with CBC, CMP and magnesium level, and every 3 weeks.The patient understands the plans discussed today and is in agreement with them.  She knows to contact our office if she develops concerns prior to her next appointment.    Anna Kaplan,  MD  [facility]  [facility] [place] [address] [place] [address] Dept: [phone] Dept Fax: [phone]   Orders Placed This Encounter  Procedures   CBC and differential    This external order was created through the  Results Console.   CBC    This external order was created through the Results Console.   Basic metabolic panel    This external order was created through the Results Console.   Comprehensive metabolic panel    This external order was created through the Results Console.   Hepatic function panel    This external order was created through the Results Console.   Ambulatory referral to Physical Therapy    Referral Priority:   Routine    Referral Type:   Physical Medicine    Referral Reason:   Specialty Services Required    Requested Specialty:   Physical Therapy    Number of Visits Requested:   1      CHIEF COMPLAINT:  CC: A 51-year old female with history of breast cancer here for 1 week evaluation  Current Treatment:  THP; will proceed with HP  INTERVAL HISTORY:  Anna [name] is here today for repeat clinical assessment. She complains of worsening fatigue. We have stopped the Taxotere and she had Herceptin and Perjeta last week. She will have Xgeva this week and every 4 weeks.She rates her back pain at a 5/10 and is using a walker. We will refer her to PT for neck strengthening exercises. Her hemoglobin is stable at 11.4 and CMP is unremarkable. She denies fevers or chills. She denies pain. Her appetite is good. Her weight is down 2 pounds.  I have reviewed the past medical history, past surgical history, social history and family history with the patient and they are unchanged from previous note.  ALLERGIES:  has No Known Allergies.  MEDICATIONS:  Current Outpatient Medications  Medication Sig Dispense Refill   albuterol (VENTOLIN HFA) 108 (90 Base) MCG/ACT inhaler Inhale 2 puffs into the lungs every 6 (six) hours as needed for shortness of breath.     ARIPiprazole (ABILIFY) 5 MG tablet Take 2.5 mg by mouth daily.     aspirin 81 MG EC tablet Take 81 mg by mouth daily. Swallow whole.     atorvastatin (LIPITOR) 20 MG tablet Take 1 tablet (20 mg total) by mouth daily. 90 tablet 3    augmented betamethasone dipropionate (DIPROLENE-AF) 0.05 % cream betamethasone, augmented 0.05 % topical cream     calcium carbonate (OS-CAL) 1250 (500 Ca) MG chewable tablet Chew 6 tablets by mouth daily.     celecoxib (CELEBREX) 200 MG capsule celecoxib 200 mg capsule (Patient not taking: Reported on 02/21/2022)     CLENPIQ 10-3.5-12 MG-GM -GM/160ML SOLN      desvenlafaxine (PRISTIQ) 50 MG 24 hr tablet Take 50 mg by mouth daily.     dexamethasone (DECADRON) 4 MG tablet TAKE TWO TABLETS BY MOUTH 2 TIMES A DAY BEGINNING THE DAY BEFORE CHEMOTHERAPY AND FOR 2 DAYS AFTER EVERY 3 WEEKS 60 tablet 1   diphenoxylate-atropine (LOMOTIL) 2.5-0.025 MG tablet TAKE TWO TABLETS BY MOUTH FOUR TIMES A DAY AS NEEDED FOR DIARRHEA OR LOOSE STOOLS 100 tablet 1   FARXIGA 10 MG TABS tablet Take 1 tablet (10 mg total) by mouth daily. 30 tablet 11   fluconazole (DIFLUCAN) 100 MG tablet Take 1 tablet (100 mg total) by mouth daily. (Patient not taking: Reported on 02/21/2022) 5 tablet 0     furosemide (LASIX) 20 MG tablet Take 1 tablet (20 mg total) by mouth daily. 30 tablet 0   labetalol (NORMODYNE) 200 MG tablet Take 1 tablet (200 mg total) by mouth 2 (two) times daily. 180 tablet 3   Loratadine 10 MG CAPS Take 1 capsule (10 mg total) by mouth daily. 30 capsule 6   LORazepam (ATIVAN) 1 MG tablet Take 1 tablet (1 mg total) by mouth every 8 (eight) hours. 30 tablet 0   magic mouthwash (lidocaine, diphenhydrAMINE, alum & mag hydroxide) suspension Take 5 mLs by mouth every 3 (three) hours as needed. Swish and spit; for throat/mouth pain     methocarbamol (ROBAXIN) 500 MG tablet methocarbamol 500 mg tablet  1 po q 6 hrs prn spasms     morphine (MS CONTIN) 15 MG 12 hr tablet TAKE 1 TABLET BY MOUTH EVERY 12 HOURS 60 tablet 0   morphine (MSIR) 15 MG tablet Take by mouth every 12 (twelve) hours as needed.     mupirocin ointment (BACTROBAN) 2 % mupirocin 2 % topical ointment     nitroGLYCERIN (NITROSTAT) 0.4 MG SL tablet Place 0.4 mg  under the tongue every 5 (five) minutes as needed for chest pain. (Patient not taking: Reported on 11/23/2021)     omeprazole (PRILOSEC) 40 MG capsule      ondansetron (ZOFRAN) 4 MG tablet Take 1 tablet (4 mg total) by mouth every 4 (four) hours as needed for nausea. 90 tablet 3   Oxycodone HCl 10 MG TABS Take 1 tablet (10 mg total) by mouth every 4 (four) hours as needed. 120 tablet 0   OZEMPIC, 0.25 OR 0.5 MG/DOSE, 2 MG/1.5ML SOPN Inject into the skin.     potassium chloride SA (KLOR-CON M) 20 MEQ tablet Take 1 tablet (20 mEq total) by mouth 2 (two) times daily. 60 tablet 0   prochlorperazine (COMPAZINE) 10 MG tablet Take 1 tablet (10 mg total) by mouth every 6 (six) hours as needed for nausea or vomiting. 90 tablet 3   topiramate (TOPAMAX) 50 MG tablet Take 50 mg by mouth daily.     traMADol (ULTRAM) 50 MG tablet      valsartan (DIOVAN) 40 MG tablet Take 1 tablet (40 mg total) by mouth daily. 90 tablet 3   Vitamin D, Ergocalciferol, (DRISDOL) 1.25 MG (50000 UNIT) CAPS capsule Take 50,000 Units by mouth once a week.     Current Facility-Administered Medications  Medication Dose Route Frequency Provider Last Rate Last Admin   0.9 %  sodium chloride infusion  500 mL Intravenous Once Jackquline Denmark, MD       technetium sestamibi generic (CARDIOLITE) injection 67.1 millicurie  24.5 millicurie Intravenous Once PRN Revankar, Reita Cliche, MD        HISTORY OF PRESENT ILLNESS:   Oncology History  Malignant neoplasm of upper-outer quadrant of left female breast ([place])  07/23/2013 Initial Diagnosis   Malignant neoplasm of upper-outer quadrant of left female breast ([place])   07/23/2013 Cancer Staging   Staging form: Breast, AJCC 7th Edition - Clinical stage from 07/23/2013: Stage IIB (T2, N1, M0) - Signed by Anna Kaplan, MD on 07/04/2020 Prognostic indicators: Pos LVI   09/22/2021 -  Chemotherapy   Patient is on Treatment Plan : BREAST  Trastuzumab + Pertuzumab q21d      Malignant neoplasm  metastatic to bone ([place])  06/28/2021 Initial Diagnosis   Bone metastases ([place])   09/22/2021 -  Chemotherapy   Patient is on Treatment Plan : BREAST  Trastuzumab +  Pertuzumab q21d          REVIEW OF SYSTEMS:   Constitutional: Denies fevers, chills or abnormal weight loss Eyes: Denies blurriness of vision Ears, nose, mouth, throat, and face: Denies mucositis or sore throat Respiratory: Denies cough, dyspnea or wheezes Cardiovascular: Denies palpitation, chest discomfort or lower extremity swelling Gastrointestinal:  Denies nausea, heartburn or change in bowel habits Skin: Denies abnormal skin rashes Lymphatics: Denies new lymphadenopathy or easy bruising Neurological:Denies numbness, tingling or new weaknesses Behavioral/Psych: Mood is stable, no new changes  All other systems were reviewed with the patient and are negative.   VITALS:  Blood pressure (!) 99/55, pulse 93, temperature 98.4 F (36.9 C), temperature source Oral, resp. rate 18, height 4' 11" (1.499 m), weight 145 lb 3.2 oz (65.9 kg), last menstrual period 08/18/2013, SpO2 95 %.  Wt Readings from Last 3 Encounters:  03/09/22 139 lb 8 oz (63.3 kg)  02/23/22 141 lb (64 kg)  02/21/22 143 lb (64.9 kg)    Body mass index is 29.33 kg/m.  Performance status (ECOG): 1 - Symptomatic but completely ambulatory  PHYSICAL EXAM:   GENERAL:alert, no distress and comfortable SKIN: skin color, texture, turgor are normal, no rashes or significant lesions EYES: normal, Conjunctiva are pink and non-injected, sclera clear OROPHARYNX:no exudate, no erythema and lips, buccal mucosa, and tongue normal  NECK: supple, thyroid normal size, non-tender, without nodularity LYMPH:  no palpable lymphadenopathy in the cervical, axillary or inguinal LUNGS: clear to auscultation and percussion with normal breathing effort HEART: regular rate & rhythm and no murmurs and no lower extremity edema ABDOMEN:abdomen soft, non-tender and normal bowel  sounds Musculoskeletal:no cyanosis of digits and no clubbing  NEURO: alert & oriented x 3 with fluent speech, no focal motor/sensory deficits  LABORATORY DATA:  I have reviewed the data as listed    Component Value Date/Time   NA 139 02/21/2022 0000   K 3.7 02/21/2022 0000   CL 107 02/21/2022 0000   CO2 26 (A) 02/21/2022 0000   GLUCOSE 147 (H) 07/05/2021 0330   BUN 17 02/21/2022 0000   CREATININE 0.6 02/21/2022 0000   CREATININE 0.71 07/05/2021 0330   CALCIUM 8.5 (A) 02/21/2022 0000   PROT 7.7 06/30/2021 0833   ALBUMIN 3.9 02/21/2022 0000   AST 30 02/21/2022 0000   ALT 17 02/21/2022 0000   ALKPHOS 78 02/21/2022 0000   BILITOT 0.7 06/30/2021 0833   GFRNONAA >60 07/05/2021 0330    No results found for: "SPEP", "UPEP"  Lab Results  Component Value Date   WBC 7.1 02/21/2022   NEUTROABS 6.25 02/21/2022   HGB 12.0 02/21/2022   HCT 38 02/21/2022   MCV 79 (A) 09/06/2021   PLT 223 02/21/2022      Chemistry      Component Value Date/Time   NA 139 02/21/2022 0000   K 3.7 02/21/2022 0000   CL 107 02/21/2022 0000   CO2 26 (A) 02/21/2022 0000   BUN 17 02/21/2022 0000   CREATININE 0.6 02/21/2022 0000   CREATININE 0.71 07/05/2021 0330   GLU 111 02/21/2022 0000      Component Value Date/Time   CALCIUM 8.5 (A) 02/21/2022 0000   ALKPHOS 78 02/21/2022 0000   AST 30 02/21/2022 0000   ALT 17 02/21/2022 0000   BILITOT 0.7 06/30/2021 0833       RADIOGRAPHIC STUDIES: I have personally reviewed the radiological images as listed and agreed with the findings in the report. ECHOCARDIOGRAM COMPLETE  Result Date: 03/07/2022  ECHOCARDIOGRAM REPORT   Patient Name:   Anna [name] Date of Exam: 03/07/2022 Medical Rec #:  [id number]           Height:       59.0 in Accession #:    [id number]          Weight:       141.0 lb Date of Birth:  09/19/1970          BSA:          1.590 m Patient Age:    50 years            BP:           99/69 mmHg Patient Gender: F                   HR:            96 bpm. Exam Location:  [place] Procedure: 2D Echo, Cardiac Doppler and Color Doppler Indications:    Malignant neoplasm metastatic to bone ([facility]) [C79.51 (ICD-10-CM)]  History:        Patient has prior history of Echocardiogram examinations, most                 recent 11/20/2021. Risk Factors:Hypertension and Dyslipidemia.  Sonographer:    Richard Lombardo RDCS Referring Phys: [id number]  H   Sonographer Comments: Suboptimal subcostal window and suboptimal apical window. Image acquisition challenging due to uncooperative patient. Patient refused the use of Definity. IMPRESSIONS  1. TDS, unable to perform GLS. Left ventricular ejection fraction, by estimation, is 55 to 60%. The left ventricle has normal function. The left ventricle has no regional wall motion abnormalities. Left ventricular diastolic parameters were normal.  2. Right ventricular systolic function is normal. The right ventricular size is normal.  3. The mitral valve is normal in structure. No evidence of mitral valve regurgitation. No evidence of mitral stenosis.  4. The aortic valve is normal in structure. Aortic valve regurgitation is not visualized. No aortic stenosis is present.  5. The inferior vena cava is normal in size with greater than 50% respiratory variability, suggesting right atrial pressure of 3 mmHg. FINDINGS  Left Ventricle: TDS, unable to perform GLS. Left ventricular ejection fraction, by estimation, is 55 to 60%. The left ventricle has normal function. The left ventricle has no regional wall motion abnormalities. The left ventricular internal cavity size was normal in size. There is no left ventricular hypertrophy. Left ventricular diastolic parameters were normal. Right Ventricle: The right ventricular size is normal. No increase in right ventricular wall thickness. Right ventricular systolic function is normal. Left Atrium: Left atrial size was normal in size. Right Atrium: Right atrial size was normal in size.  Pericardium: There is no evidence of pericardial effusion. Mitral Valve: The mitral valve is normal in structure. No evidence of mitral valve regurgitation. No evidence of mitral valve stenosis. Tricuspid Valve: The tricuspid valve is normal in structure. Tricuspid valve regurgitation is not demonstrated. No evidence of tricuspid stenosis. Aortic Valve: The aortic valve is normal in structure. Aortic valve regurgitation is not visualized. No aortic stenosis is present. Pulmonic Valve: The pulmonic valve was normal in structure. Pulmonic valve regurgitation is not visualized. No evidence of pulmonic stenosis. Aorta: The aortic root is normal in size and structure. Venous: The inferior vena cava is normal in size with greater than 50% respiratory variability, suggesting right atrial pressure of 3 mmHg. IAS/Shunts: No atrial level shunt detected by color flow Doppler.  LEFT   VENTRICLE PLAX 2D LVIDd:         3.80 cm   Diastology LVIDs:         3.00 cm   LV e' medial:    9.79 cm/s LV PW:         0.90 cm   LV E/e' medial:  7.6 LV IVS:        0.80 cm   LV e' lateral:   13.10 cm/s LVOT diam:     1.80 cm   LV E/e' lateral: 5.7 LV SV:         41 LV SV Index:   26 LVOT Area:     2.54 cm  LEFT ATRIUM           Index LA diam:      2.30 cm 1.45 cm/m LA Vol (A4C): 29.0 ml 18.24 ml/m  AORTIC VALVE LVOT Vmax:   79.60 cm/s LVOT Vmean:  52.600 cm/s LVOT VTI:    0.160 m  AORTA Ao Root diam: 3.10 cm Ao Asc diam:  2.40 cm MITRAL VALVE MV Area (PHT): 6.54 cm    SHUNTS MV Decel Time: 116 msec    Systemic VTI:  0.16 m MV E velocity: 74.60 cm/s  Systemic Diam: 1.80 cm MV A velocity: 61.30 cm/s MV E/A ratio:  1.22 Robert Krasowski MD Electronically signed by Robert Krasowski MD Signature Date/Time: 03/07/2022/12:33:07 PM    Final    

## 2022-02-09 ENCOUNTER — Inpatient Hospital Stay (INDEPENDENT_AMBULATORY_CARE_PROVIDER_SITE_OTHER): Payer: BC Managed Care – PPO | Admitting: Oncology

## 2022-02-09 ENCOUNTER — Inpatient Hospital Stay: Payer: BC Managed Care – PPO

## 2022-02-09 ENCOUNTER — Encounter: Payer: Self-pay | Admitting: Oncology

## 2022-02-09 ENCOUNTER — Telehealth: Payer: Self-pay | Admitting: Oncology

## 2022-02-09 VITALS — BP 99/55 | HR 93 | Temp 98.4°F | Resp 18 | Ht 59.0 in | Wt 145.2 lb

## 2022-02-09 VITALS — BP 94/61 | HR 92 | Temp 98.4°F | Resp 18 | Ht 59.0 in | Wt 145.2 lb

## 2022-02-09 DIAGNOSIS — M8580 Other specified disorders of bone density and structure, unspecified site: Secondary | ICD-10-CM

## 2022-02-09 DIAGNOSIS — Z5111 Encounter for antineoplastic chemotherapy: Secondary | ICD-10-CM | POA: Diagnosis not present

## 2022-02-09 DIAGNOSIS — Z780 Asymptomatic menopausal state: Secondary | ICD-10-CM

## 2022-02-09 DIAGNOSIS — C50412 Malignant neoplasm of upper-outer quadrant of left female breast: Secondary | ICD-10-CM | POA: Diagnosis not present

## 2022-02-09 DIAGNOSIS — C7951 Secondary malignant neoplasm of bone: Secondary | ICD-10-CM

## 2022-02-09 DIAGNOSIS — Z170 Estrogen receptor positive status [ER+]: Secondary | ICD-10-CM

## 2022-02-09 LAB — HEPATIC FUNCTION PANEL
ALT: 16 U/L (ref 7–35)
AST: 22 (ref 13–35)
Alkaline Phosphatase: 69 (ref 25–125)
Bilirubin, Total: 0.4

## 2022-02-09 LAB — BASIC METABOLIC PANEL
BUN: 19 (ref 4–21)
CO2: 29 — AB (ref 13–22)
Chloride: 107 (ref 99–108)
Creatinine: 0.5 (ref 0.5–1.1)
Glucose: 129
Potassium: 3.9 mEq/L (ref 3.5–5.1)
Sodium: 140 (ref 137–147)

## 2022-02-09 LAB — CBC AND DIFFERENTIAL
HCT: 37 (ref 36–46)
Hemoglobin: 11.4 — AB (ref 12.0–16.0)
Neutrophils Absolute: 4.33
Platelets: 216 10*3/uL (ref 150–400)
WBC: 5.7

## 2022-02-09 LAB — CBC: RBC: 4.41 (ref 3.87–5.11)

## 2022-02-09 LAB — COMPREHENSIVE METABOLIC PANEL
Albumin: 3.8 (ref 3.5–5.0)
Calcium: 8.8 (ref 8.7–10.7)

## 2022-02-09 MED ORDER — DENOSUMAB 120 MG/1.7ML ~~LOC~~ SOLN
120.0000 mg | Freq: Once | SUBCUTANEOUS | Status: AC
  Administered 2022-02-09: 120 mg via SUBCUTANEOUS
  Filled 2022-02-09: qty 1.7

## 2022-02-09 NOTE — Patient Instructions (Signed)
Denosumab injection What is this medication? DENOSUMAB (den oh sue mab) slows bone breakdown. Prolia is used to treat osteoporosis in women after menopause and in men, and in people who are taking corticosteroids for 6 months or more. Xgeva is used to treat a high calcium level due to cancer and to prevent bone fractures and other bone problems caused by multiple myeloma or cancer bone metastases. Xgeva is also used to treat giant cell tumor of the bone. This medicine may be used for other purposes; ask your health care provider or pharmacist if you have questions. COMMON BRAND NAME(S): Prolia, XGEVA What should I tell my care team before I take this medication? They need to know if you have any of these conditions: dental disease having surgery or tooth extraction infection kidney disease low levels of calcium or Vitamin D in the blood malnutrition on hemodialysis skin conditions or sensitivity thyroid or parathyroid disease an unusual reaction to denosumab, other medicines, foods, dyes, or preservatives pregnant or trying to get pregnant breast-feeding How should I use this medication? This medicine is for injection under the skin. It is given by a health care professional in a hospital or clinic setting. A special MedGuide will be given to you before each treatment. Be sure to read this information carefully each time. For Prolia, talk to your pediatrician regarding the use of this medicine in children. Special care may be needed. For Xgeva, talk to your pediatrician regarding the use of this medicine in children. While this drug may be prescribed for children as young as 13 years for selected conditions, precautions do apply. Overdosage: If you think you have taken too much of this medicine contact a poison control center or emergency room at once. NOTE: This medicine is only for you. Do not share this medicine with others. What if I miss a dose? It is important not to miss your dose.  Call your doctor or health care professional if you are unable to keep an appointment. What may interact with this medication? Do not take this medicine with any of the following medications: other medicines containing denosumab This medicine may also interact with the following medications: medicines that lower your chance of fighting infection steroid medicines like prednisone or cortisone This list may not describe all possible interactions. Give your health care provider a list of all the medicines, herbs, non-prescription drugs, or dietary supplements you use. Also tell them if you smoke, drink alcohol, or use illegal drugs. Some items may interact with your medicine. What should I watch for while using this medication? Visit your doctor or health care professional for regular checks on your progress. Your doctor or health care professional may order blood tests and other tests to see how you are doing. Call your doctor or health care professional for advice if you get a fever, chills or sore throat, or other symptoms of a cold or flu. Do not treat yourself. This drug may decrease your body's ability to fight infection. Try to avoid being around people who are sick. You should make sure you get enough calcium and vitamin D while you are taking this medicine, unless your doctor tells you not to. Discuss the foods you eat and the vitamins you take with your health care professional. See your dentist regularly. Brush and floss your teeth as directed. Before you have any dental work done, tell your dentist you are receiving this medicine. Do not become pregnant while taking this medicine or for 5 months after   stopping it. Talk with your doctor or health care professional about your birth control options while taking this medicine. Women should inform their doctor if they wish to become pregnant or think they might be pregnant. There is a potential for serious side effects to an unborn child. Talk to  your health care professional or pharmacist for more information. What side effects may I notice from receiving this medication? Side effects that you should report to your doctor or health care professional as soon as possible: allergic reactions like skin rash, itching or hives, swelling of the face, lips, or tongue bone pain breathing problems dizziness jaw pain, especially after dental work redness, blistering, peeling of the skin signs and symptoms of infection like fever or chills; cough; sore throat; pain or trouble passing urine signs of low calcium like fast heartbeat, muscle cramps or muscle pain; pain, tingling, numbness in the hands or feet; seizures unusual bleeding or bruising unusually weak or tired Side effects that usually do not require medical attention (report to your doctor or health care professional if they continue or are bothersome): constipation diarrhea headache joint pain loss of appetite muscle pain runny nose tiredness upset stomach This list may not describe all possible side effects. Call your doctor for medical advice about side effects. You may report side effects to FDA at 1-800-FDA-1088. Where should I keep my medication? This medicine is only given in a clinic, doctor's office, or other health care setting and will not be stored at home. NOTE: This sheet is a summary. It may not cover all possible information. If you have questions about this medicine, talk to your doctor, pharmacist, or health care provider.  2023 Elsevier/Gold Standard (2017-12-06 00:00:00)  

## 2022-02-09 NOTE — Telephone Encounter (Signed)
Per 02/09/22 los next appt scheduled and confirmed with patient

## 2022-02-12 ENCOUNTER — Other Ambulatory Visit: Payer: Self-pay | Admitting: Pharmacist

## 2022-02-12 ENCOUNTER — Other Ambulatory Visit: Payer: Self-pay

## 2022-02-12 ENCOUNTER — Telehealth: Payer: Self-pay

## 2022-02-12 DIAGNOSIS — M545 Low back pain, unspecified: Secondary | ICD-10-CM

## 2022-02-12 DIAGNOSIS — R0789 Other chest pain: Secondary | ICD-10-CM

## 2022-02-12 DIAGNOSIS — C7951 Secondary malignant neoplasm of bone: Secondary | ICD-10-CM

## 2022-02-12 MED ORDER — OXYCODONE HCL 10 MG PO TABS: 10.0000 mg | ORAL_TABLET | ORAL | 0 refills | Status: DC | PRN

## 2022-02-12 NOTE — Telephone Encounter (Signed)
Referral faxed 02/09/22.

## 2022-02-12 NOTE — Telephone Encounter (Signed)
-----   Message from Derwood Kaplan, MD sent at 02/09/2022 10:44 AM EDT ----- Regarding: refer Pls refer PT regarding neck weakness, I wrote Rx & gave to her.  She is requesting [facility], near where she lives.  Pls evaluate and treat

## 2022-02-21 ENCOUNTER — Inpatient Hospital Stay (INDEPENDENT_AMBULATORY_CARE_PROVIDER_SITE_OTHER): Payer: BC Managed Care – PPO | Admitting: Hematology and Oncology

## 2022-02-21 ENCOUNTER — Encounter: Payer: Self-pay | Admitting: Hematology and Oncology

## 2022-02-21 ENCOUNTER — Telehealth: Payer: Self-pay | Admitting: Hematology and Oncology

## 2022-02-21 ENCOUNTER — Inpatient Hospital Stay: Payer: BC Managed Care – PPO | Attending: Oncology

## 2022-02-21 DIAGNOSIS — C7951 Secondary malignant neoplasm of bone: Secondary | ICD-10-CM

## 2022-02-21 DIAGNOSIS — Z170 Estrogen receptor positive status [ER+]: Secondary | ICD-10-CM | POA: Diagnosis not present

## 2022-02-21 DIAGNOSIS — C50412 Malignant neoplasm of upper-outer quadrant of left female breast: Secondary | ICD-10-CM | POA: Insufficient documentation

## 2022-02-21 DIAGNOSIS — Z5111 Encounter for antineoplastic chemotherapy: Secondary | ICD-10-CM | POA: Insufficient documentation

## 2022-02-21 DIAGNOSIS — Z5112 Encounter for antineoplastic immunotherapy: Secondary | ICD-10-CM | POA: Insufficient documentation

## 2022-02-21 LAB — MAGNESIUM: Magnesium: 2

## 2022-02-21 LAB — CBC AND DIFFERENTIAL
HCT: 38 (ref 36–46)
Hemoglobin: 12 (ref 12.0–16.0)
Neutrophils Absolute: 6.25
Platelets: 223 10*3/uL (ref 150–400)
WBC: 7.1

## 2022-02-21 LAB — HEPATIC FUNCTION PANEL
ALT: 17 U/L (ref 7–35)
AST: 30 (ref 13–35)
Alkaline Phosphatase: 78 (ref 25–125)
Bilirubin, Total: 0.7

## 2022-02-21 LAB — BASIC METABOLIC PANEL
BUN: 17 (ref 4–21)
CO2: 26 — AB (ref 13–22)
Chloride: 107 (ref 99–108)
Creatinine: 0.6 (ref 0.5–1.1)
Glucose: 111
Potassium: 3.7 mEq/L (ref 3.5–5.1)
Sodium: 139 (ref 137–147)

## 2022-02-21 LAB — COMPREHENSIVE METABOLIC PANEL
Albumin: 3.9 (ref 3.5–5.0)
Calcium: 8.5 — AB (ref 8.7–10.7)

## 2022-02-21 LAB — CBC: RBC: 4.65 (ref 3.87–5.11)

## 2022-02-21 MED ORDER — FUROSEMIDE 20 MG PO TABS: 20.0000 mg | ORAL_TABLET | Freq: Every day | ORAL | 0 refills | Status: DC

## 2022-02-21 MED ORDER — ONDANSETRON HCL 4 MG PO TABS: 4.0000 mg | ORAL_TABLET | ORAL | 3 refills | Status: DC | PRN

## 2022-02-21 NOTE — Progress Notes (Signed)
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 She was on adjuvant hormonal therapy with tamoxifen 20 mg daily from October 2015 to May 2020, but due to elevation of the liver transaminases was switched to anastrazole.  Malignant neoplasm metastatic to bone [facility]) New bone metastases, November 2022, with multiple marrow replacing lesions within the proximal right femur within the ischium as well as the inferior rami. 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 Ki67 was 60%. PET imaging from December 19th revealed dominant finding of intensely hypermetabolic aggressive skeletal metastasis involving the calvarium, sternum, thoracic and lumbar spine, and pelvis, with potential pathologic fracture of the right femur with internal fixation. There is soft tissue extension into the anterior mediastinum associated with the manubrial metastasis. Multiple spinal vertebral body lesions have cortical destruction along the posterior margin. She started monthly Xgeva in December, and recently completed radiation therapy. She was receiving THP and will plan for hormonal therapy.  Cycle 4 was delayed and dose reduction made by 20% due to left leg infection. This is being wrapped daily under the care of the wound clinic with Dr. Coralie Keens. She continues to have leg wrapped  daily and infection was found to be staph. Her heel lesion is almost completely closed and she should be discharged as early as next week from the wound clinic. She will continue with Herceptin/ Perjeta, but we  canceled the chemotherapy. She is now undergoing PT for her neck and back pain and states this is helping. She will proceed with HER2 treatment Friday.     The patient understands the plans discussed today and is in agreement with them.  She knows to contact our office if she develops concerns prior to her next appointment.    Melodye Ped, NP  [place] [address] [place] [address] Dept: [phone] Dept Fax: [phone]   Orders Placed This Encounter  Procedures   CBC and differential    This external order was created through the Results Console.   CBC    This external order was created through the Results Console.   Basic metabolic panel    This external order was created through the Results Console.   Comprehensive metabolic panel    This external order was created through the Results Console.   Hepatic function panel    This external order was created through the Results Console.   Magnesium    This order was created through External Result Entry      CHIEF COMPLAINT:  CC: A 51 year old female with history of breast cancer here for 3 week follow up  Current Treatment:  Perjeta/ Herceptin  INTERVAL HISTORY:  Anna [name] is here today for repeat clinical assessment. She denies fevers or chills. She denies pain. Her appetite is good. Her weight has been stable.  I have reviewed the past medical history, past  surgical history, social history and family history with the patient and they are unchanged from previous note.  ALLERGIES:  has No Known Allergies.  MEDICATIONS:  Current Outpatient Medications  Medication Sig Dispense Refill   albuterol (VENTOLIN HFA) 108 (90 Base) MCG/ACT  inhaler Inhale 2 puffs into the lungs every 6 (six) hours as needed for shortness of breath.     ARIPiprazole (ABILIFY) 5 MG tablet Take 2.5 mg by mouth daily.     aspirin 81 MG EC tablet Take 81 mg by mouth daily. Swallow whole.     atorvastatin (LIPITOR) 20 MG tablet Take 1 tablet (20 mg total) by mouth daily. 90 tablet 3   augmented betamethasone dipropionate (DIPROLENE-AF) 0.05 % cream betamethasone, augmented 0.05 % topical cream     calcium carbonate (OS-CAL) 1250 (500 Ca) MG chewable tablet Chew 6 tablets by mouth daily.     celecoxib (CELEBREX) 200 MG capsule celecoxib 200 mg capsule (Patient not taking: Reported on 02/21/2022)     CLENPIQ 10-3.5-12 MG-GM -GM/160ML SOLN      desvenlafaxine (PRISTIQ) 50 MG 24 hr tablet Take 50 mg by mouth daily.     dexamethasone (DECADRON) 4 MG tablet TAKE TWO TABLETS BY MOUTH 2 TIMES A DAY BEGINNING THE DAY BEFORE CHEMOTHERAPY AND FOR 2 DAYS AFTER EVERY 3 WEEKS 60 tablet 1   diphenoxylate-atropine (LOMOTIL) 2.5-0.025 MG tablet TAKE TWO TABLETS BY MOUTH FOUR TIMES A DAY AS NEEDED FOR DIARRHEA OR LOOSE STOOLS 100 tablet 1   FARXIGA 10 MG TABS tablet Take 1 tablet (10 mg total) by mouth daily. 30 tablet 11   fluconazole (DIFLUCAN) 100 MG tablet Take 1 tablet (100 mg total) by mouth daily. (Patient not taking: Reported on 02/21/2022) 5 tablet 0   labetalol (NORMODYNE) 200 MG tablet Take 1 tablet (200 mg total) by mouth 2 (two) times daily. 180 tablet 3   Loratadine 10 MG CAPS Take 1 capsule (10 mg total) by mouth daily. 30 capsule 6   LORazepam (ATIVAN) 1 MG tablet Take 1 tablet (1 mg total) by mouth every 8 (eight) hours. 30 tablet 0   magic mouthwash (lidocaine, diphenhydrAMINE, alum & mag hydroxide) suspension Take 5 mLs by mouth every 3 (three) hours as needed. Swish and spit; for throat/mouth pain     methocarbamol (ROBAXIN) 500 MG tablet methocarbamol 500 mg tablet  1 po q 6 hrs prn spasms     morphine (MS CONTIN) 15 MG 12 hr tablet TAKE 1 TABLET BY MOUTH  EVERY 12 HOURS 60 tablet 0   morphine (MSIR) 15 MG tablet Take by mouth every 12 (twelve) hours as needed.     mupirocin ointment (BACTROBAN) 2 % mupirocin 2 % topical ointment     nitroGLYCERIN (NITROSTAT) 0.4 MG SL tablet Place 0.4 mg under the tongue every 5 (five) minutes as needed for chest pain. (Patient not taking: Reported on 11/23/2021)     omeprazole (PRILOSEC) 40 MG capsule      ondansetron (ZOFRAN) 4 MG tablet Take 1 tablet (4 mg total) by mouth every 4 (four) hours as needed for nausea. 90 tablet 3   Oxycodone HCl 10 MG TABS Take 1 tablet (10 mg total) by mouth every 4 (four) hours as needed. 120 tablet 0   OZEMPIC, 0.25 OR 0.5 MG/DOSE, 2 MG/1.5ML SOPN Inject into the skin.     potassium chloride SA (KLOR-CON M) 20 MEQ tablet Take 1 tablet (20 mEq total) by mouth 2 (two) times daily. 60 tablet 0  prochlorperazine (COMPAZINE) 10 MG tablet Take 1 tablet (10 mg total) by mouth every 6 (six) hours as needed for nausea or vomiting. 90 tablet 3   topiramate (TOPAMAX) 50 MG tablet Take 50 mg by mouth daily.     traMADol (ULTRAM) 50 MG tablet      valsartan (DIOVAN) 40 MG tablet Take 1 tablet (40 mg total) by mouth daily. 90 tablet 3   Vitamin D, Ergocalciferol, (DRISDOL) 1.25 MG (50000 UNIT) CAPS capsule Take 50,000 Units by mouth once a week.     Current Facility-Administered Medications  Medication Dose Route Frequency Provider Last Rate Last Admin   0.9 %  sodium chloride infusion  500 mL Intravenous Once Jackquline Denmark, MD       technetium sestamibi generic (CARDIOLITE) injection 60.4 millicurie  54.0 millicurie Intravenous Once PRN Revankar, Reita Cliche, MD        HISTORY OF PRESENT ILLNESS:   Oncology History  Malignant neoplasm of upper-outer quadrant of left female breast ([place])  07/23/2013 Initial Diagnosis   Malignant neoplasm of upper-outer quadrant of left female breast ([place])   07/23/2013 Cancer Staging   Staging form: Breast, AJCC 7th Edition - Clinical stage from  07/23/2013: Stage IIB (T2, N1, M0) - Signed by Derwood Kaplan, MD on 07/04/2020 Prognostic indicators: Pos LVI   09/22/2021 -  Chemotherapy   Patient is on Treatment Plan : BREAST  Trastuzumab + Pertuzumab q21d      Malignant neoplasm metastatic to bone ([place])  06/28/2021 Initial Diagnosis   Bone metastases ([place])   09/22/2021 -  Chemotherapy   Patient is on Treatment Plan : BREAST  Trastuzumab + Pertuzumab q21d          REVIEW OF SYSTEMS:   Constitutional: Denies fevers, chills or abnormal weight loss Eyes: Denies blurriness of vision Ears, nose, mouth, throat, and face: Denies mucositis or sore throat Respiratory: Denies cough, dyspnea or wheezes Cardiovascular: Denies palpitation, chest discomfort or lower extremity swelling Gastrointestinal:  Denies nausea, heartburn or change in bowel habits Skin: Denies abnormal skin rashes Lymphatics: Denies new lymphadenopathy or easy bruising Neurological:Denies numbness, tingling or new weaknesses Behavioral/Psych: Mood is stable, no new changes  All other systems were reviewed with the patient and are negative.   VITALS:  Blood pressure 102/68, pulse (!) 116, temperature 98.9 F (37.2 C), temperature source Oral, resp. rate 20, height 4' 11" (1.499 m), weight 143 lb (64.9 kg), last menstrual period 08/18/2013, SpO2 94 %.  Wt Readings from Last 3 Encounters:  02/21/22 143 lb (64.9 kg)  02/09/22 145 lb 3.2 oz (65.9 kg)  02/09/22 145 lb 3.2 oz (65.9 kg)    Body mass index is 28.88 kg/m.  Performance status (ECOG): 1 - Symptomatic but completely ambulatory  PHYSICAL EXAM:   GENERAL:alert, no distress and comfortable SKIN: skin color, texture, turgor are normal, no rashes or significant lesions EYES: normal, Conjunctiva are pink and non-injected, sclera clear OROPHARYNX:no exudate, no erythema and lips, buccal mucosa, and tongue normal  NECK: supple, thyroid normal size, non-tender, without nodularity LYMPH:  no palpable  lymphadenopathy in the cervical, axillary or inguinal LUNGS: clear to auscultation and percussion with normal breathing effort HEART: regular rate & rhythm and no murmurs and no lower extremity edema ABDOMEN:abdomen soft, non-tender and normal bowel sounds Musculoskeletal:no cyanosis of digits and no clubbing  NEURO: alert & oriented x 3 with fluent speech, no focal motor/sensory deficits  LABORATORY DATA:  I have reviewed the data as listed    Component  Value Date/Time   NA 139 02/21/2022 0000   K 3.7 02/21/2022 0000   CL 107 02/21/2022 0000   CO2 26 (A) 02/21/2022 0000   GLUCOSE 147 (H) 07/05/2021 0330   BUN 17 02/21/2022 0000   CREATININE 0.6 02/21/2022 0000   CREATININE 0.71 07/05/2021 0330   CALCIUM 8.5 (A) 02/21/2022 0000   PROT 7.7 06/30/2021 0833   ALBUMIN 3.9 02/21/2022 0000   AST 30 02/21/2022 0000   ALT 17 02/21/2022 0000   ALKPHOS 78 02/21/2022 0000   BILITOT 0.7 06/30/2021 0833   GFRNONAA >60 07/05/2021 0330    No results found for: "SPEP", "UPEP"  Lab Results  Component Value Date   WBC 7.1 02/21/2022   NEUTROABS 6.25 02/21/2022   HGB 12.0 02/21/2022   HCT 38 02/21/2022   MCV 79 (A) 09/06/2021   PLT 223 02/21/2022      Chemistry      Component Value Date/Time   NA 139 02/21/2022 0000   K 3.7 02/21/2022 0000   CL 107 02/21/2022 0000   CO2 26 (A) 02/21/2022 0000   BUN 17 02/21/2022 0000   CREATININE 0.6 02/21/2022 0000   CREATININE 0.71 07/05/2021 0330   GLU 111 02/21/2022 0000      Component Value Date/Time   CALCIUM 8.5 (A) 02/21/2022 0000   ALKPHOS 78 02/21/2022 0000   AST 30 02/21/2022 0000   ALT 17 02/21/2022 0000   BILITOT 0.7 06/30/2021 5993       RADIOGRAPHIC STUDIES: I have personally reviewed the radiological images as listed and agreed with the findings in the report. No results found.

## 2022-02-21 NOTE — Telephone Encounter (Signed)
Per 02/21/22 los next appt scheduled and confirmed with patient

## 2022-02-21 NOTE — Assessment & Plan Note (Signed)
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 She was on adjuvant hormonal therapy with tamoxifen 20 mg daily from October 2015 to May 2020, but due to elevation of the liver transaminases was switched to anastrazole.

## 2022-02-21 NOTE — Assessment & Plan Note (Signed)
New bone metastases, November 2022, with multiple marrow replacing lesions within the proximal right femur within the ischium as well as the inferior rami. 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 Ki67 was 60%. PET imaging from December 19th revealed dominant finding of intensely hypermetabolic aggressive skeletal metastasis involving the calvarium, sternum, thoracic and lumbar spine, and pelvis, with potential pathologic fracture of the right femur with internal fixation. There is soft tissue extension into the anterior mediastinum associated with the manubrial metastasis. Multiple spinal vertebral body lesions have cortical destruction along the posterior margin.She started monthly Xgeva in December, and recently completed radiation therapy.She was receivingTHP andwill plan forhormonal therapy.  Cycle 4 was delayed and dose reduction made by 20% due to left leg infection.This is being wrapped daily under the care of the wound clinic with Dr. Coralie Keens. She continues to have leg wrapped daily and infection was found to be staph. Her heel lesion is almost completely closed and she should be discharged as early as next week from the wound clinic. She will continue with Herceptin/ Perjeta, but we  canceled the chemotherapy. She is now undergoing PT for her neck and back pain and states this is helping. She will proceed with HER2 treatment Friday.

## 2022-02-22 ENCOUNTER — Other Ambulatory Visit: Payer: Self-pay | Admitting: Oncology

## 2022-02-22 DIAGNOSIS — C7951 Secondary malignant neoplasm of bone: Secondary | ICD-10-CM

## 2022-02-22 MED FILL — Trastuzumab-dkst For IV Soln 150 MG: INTRAVENOUS | Qty: 19 | Status: AC

## 2022-02-22 MED FILL — Pertuzumab Soln for IV Infusion 420 MG/14ML (30 MG/ML): INTRAVENOUS | Qty: 14 | Status: AC

## 2022-02-23 ENCOUNTER — Inpatient Hospital Stay: Payer: BC Managed Care – PPO

## 2022-02-23 VITALS — BP 99/69 | HR 101 | Temp 98.0°F | Resp 18 | Ht 59.0 in | Wt 141.0 lb

## 2022-02-23 DIAGNOSIS — C7951 Secondary malignant neoplasm of bone: Secondary | ICD-10-CM | POA: Diagnosis not present

## 2022-02-23 DIAGNOSIS — C50412 Malignant neoplasm of upper-outer quadrant of left female breast: Secondary | ICD-10-CM | POA: Diagnosis present

## 2022-02-23 DIAGNOSIS — Z170 Estrogen receptor positive status [ER+]: Secondary | ICD-10-CM | POA: Diagnosis not present

## 2022-02-23 DIAGNOSIS — Z5111 Encounter for antineoplastic chemotherapy: Secondary | ICD-10-CM | POA: Diagnosis present

## 2022-02-23 DIAGNOSIS — Z5112 Encounter for antineoplastic immunotherapy: Secondary | ICD-10-CM | POA: Diagnosis present

## 2022-02-23 MED ORDER — ACETAMINOPHEN 325 MG PO TABS
650.0000 mg | ORAL_TABLET | Freq: Once | ORAL | Status: AC
  Administered 2022-02-23: 650 mg via ORAL
  Filled 2022-02-23: qty 2

## 2022-02-23 MED ORDER — DIPHENHYDRAMINE HCL 25 MG PO CAPS
50.0000 mg | ORAL_CAPSULE | Freq: Once | ORAL | Status: AC
  Administered 2022-02-23: 50 mg via ORAL
  Filled 2022-02-23: qty 2

## 2022-02-23 MED ORDER — TRASTUZUMAB-DKST CHEMO 150 MG IV SOLR
6.0000 mg/kg | Freq: Once | INTRAVENOUS | Status: AC
  Administered 2022-02-23: 399 mg via INTRAVENOUS
  Filled 2022-02-23: qty 19

## 2022-02-23 MED ORDER — HEPARIN SOD (PORK) LOCK FLUSH 100 UNIT/ML IV SOLN
500.0000 [IU] | Freq: Once | INTRAVENOUS | Status: AC | PRN
  Administered 2022-02-23: 500 [IU]

## 2022-02-23 MED ORDER — SODIUM CHLORIDE 0.9% FLUSH
10.0000 mL | INTRAVENOUS | Status: DC | PRN
  Administered 2022-02-23: 10 mL

## 2022-02-23 MED ORDER — SODIUM CHLORIDE 0.9 % IV SOLN
420.0000 mg | Freq: Once | INTRAVENOUS | Status: AC
  Administered 2022-02-23: 420 mg via INTRAVENOUS
  Filled 2022-02-23: qty 14

## 2022-02-23 MED ORDER — SODIUM CHLORIDE 0.9 % IV SOLN: Freq: Once | INTRAVENOUS | Status: AC

## 2022-02-23 NOTE — Patient Instructions (Signed)
Pertuzumab injection What is this medication? PERTUZUMAB (per TOOZ ue mab) is a monoclonal antibody. It is used to treat breast cancer. This medicine may be used for other purposes; ask your health care provider or pharmacist if you have questions. COMMON BRAND NAME(S): PERJETA What should I tell my care team before I take this medication? They need to know if you have any of these conditions: heart disease heart failure high blood pressure history of irregular heart beat recent or ongoing radiation therapy an unusual or allergic reaction to pertuzumab, other medicines, foods, dyes, or preservatives pregnant or trying to get pregnant breast-feeding How should I use this medication? This medicine is for infusion into a vein. It is given by a health care professional in a hospital or clinic setting. Talk to your pediatrician regarding the use of this medicine in children. Special care may be needed. Overdosage: If you think you have taken too much of this medicine contact a poison control center or emergency room at once. NOTE: This medicine is only for you. Do not share this medicine with others. What if I miss a dose? It is important not to miss your dose. Call your doctor or health care professional if you are unable to keep an appointment. What may interact with this medication? Interactions are not expected. Give your health care provider a list of all the medicines, herbs, non-prescription drugs, or dietary supplements you use. Also tell them if you smoke, drink alcohol, or use illegal drugs. Some items may interact with your medicine. This list may not describe all possible interactions. Give your health care provider a list of all the medicines, herbs, non-prescription drugs, or dietary supplements you use. Also tell them if you smoke, drink alcohol, or use illegal drugs. Some items may interact with your medicine. What should I watch for while using this medication? Your condition  will be monitored carefully while you are receiving this medicine. Report any side effects. Continue your course of treatment even though you feel ill unless your doctor tells you to stop. Do not become pregnant while taking this medicine or for 7 months after stopping it. Women should inform their doctor if they wish to become pregnant or think they might be pregnant. Women of child-bearing potential will need to have a negative pregnancy test before starting this medicine. There is a potential for serious side effects to an unborn child. Talk to your health care professional or pharmacist for more information. Do not breast-feed an infant while taking this medicine or for 7 months after stopping it. Women must use effective birth control with this medicine. Call your doctor or health care professional for advice if you get a fever, chills or sore throat, or other symptoms of a cold or flu. Do not treat yourself. Try to avoid being around people who are sick. You may experience fever, chills, and headache during the infusion. Report any side effects during the infusion to your health care professional. What side effects may I notice from receiving this medication? Side effects that you should report to your doctor or health care professional as soon as possible: breathing problems chest pain or palpitations dizziness feeling faint or lightheaded fever or chills skin rash, itching or hives sore throat swelling of the face, lips, or tongue swelling of the legs or ankles unusually weak or tired Side effects that usually do not require medical attention (report to your doctor or health care professional if they continue or are bothersome): diarrhea hair loss  nausea, vomiting tiredness This list may not describe all possible side effects. Call your doctor for medical advice about side effects. You may report side effects to FDA at 1-800-FDA-1088. Where should I keep my medication? This drug is  given in a hospital or clinic and will not be stored at home. NOTE: This sheet is a summary. It may not cover all possible information. If you have questions about this medicine, talk to your doctor, pharmacist, or health care provider.  2023 Elsevier/Gold Standard (2015-09-01 00:00:00) Trastuzumab injection for infusion What is this medication? TRASTUZUMAB (tras TOO zoo mab) is a monoclonal antibody. It is used to treat breast cancer and stomach cancer. This medicine may be used for other purposes; ask your health care provider or pharmacist if you have questions. COMMON BRAND NAME(S): Herceptin, Janae Bridgeman, Ontruzant, Trazimera What should I tell my care team before I take this medication? They need to know if you have any of these conditions: heart disease heart failure lung or breathing disease, like asthma an unusual or allergic reaction to trastuzumab, benzyl alcohol, or other medications, foods, dyes, or preservatives pregnant or trying to get pregnant breast-feeding How should I use this medication? This drug is given as an infusion into a vein. It is administered in a hospital or clinic by a specially trained health care professional. Talk to your pediatrician regarding the use of this medicine in children. This medicine is not approved for use in children. Overdosage: If you think you have taken too much of this medicine contact a poison control center or emergency room at once. NOTE: This medicine is only for you. Do not share this medicine with others. What if I miss a dose? It is important not to miss a dose. Call your doctor or health care professional if you are unable to keep an appointment. What may interact with this medication? This medicine may interact with the following medications: certain types of chemotherapy, such as daunorubicin, doxorubicin, epirubicin, and idarubicin This list may not describe all possible interactions. Give your health care  provider a list of all the medicines, herbs, non-prescription drugs, or dietary supplements you use. Also tell them if you smoke, drink alcohol, or use illegal drugs. Some items may interact with your medicine. What should I watch for while using this medication? Visit your doctor for checks on your progress. Report any side effects. Continue your course of treatment even though you feel ill unless your doctor tells you to stop. Call your doctor or health care professional for advice if you get a fever, chills or sore throat, or other symptoms of a cold or flu. Do not treat yourself. Try to avoid being around people who are sick. You may experience fever, chills and shaking during your first infusion. These effects are usually mild and can be treated with other medicines. Report any side effects during the infusion to your health care professional. Fever and chills usually do not happen with later infusions. Do not become pregnant while taking this medicine or for 7 months after stopping it. Women should inform their doctor if they wish to become pregnant or think they might be pregnant. Women of child-bearing potential will need to have a negative pregnancy test before starting this medicine. There is a potential for serious side effects to an unborn child. Talk to your health care professional or pharmacist for more information. Do not breast-feed an infant while taking this medicine or for 7 months after stopping it. Women must use  effective birth control with this medicine. What side effects may I notice from receiving this medication? Side effects that you should report to your doctor or health care professional as soon as possible: allergic reactions like skin rash, itching or hives, swelling of the face, lips, or tongue chest pain or palpitations cough dizziness feeling faint or lightheaded, falls fever general ill feeling or flu-like symptoms signs of worsening heart failure like breathing  problems; swelling in your legs and feet unusually weak or tired Side effects that usually do not require medical attention (report to your doctor or health care professional if they continue or are bothersome): bone pain changes in taste diarrhea joint pain nausea/vomiting weight loss This list may not describe all possible side effects. Call your doctor for medical advice about side effects. You may report side effects to FDA at 1-800-FDA-1088. Where should I keep my medication? This drug is given in a hospital or clinic and will not be stored at home. NOTE: This sheet is a summary. It may not cover all possible information. If you have questions about this medicine, talk to your doctor, pharmacist, or health care provider.  2023 Elsevier/Gold Standard (2016-08-14 00:00:00)

## 2022-03-05 ENCOUNTER — Other Ambulatory Visit: Payer: Self-pay

## 2022-03-07 ENCOUNTER — Ambulatory Visit (INDEPENDENT_AMBULATORY_CARE_PROVIDER_SITE_OTHER): Payer: BC Managed Care – PPO

## 2022-03-07 DIAGNOSIS — C7951 Secondary malignant neoplasm of bone: Secondary | ICD-10-CM

## 2022-03-07 DIAGNOSIS — Z0189 Encounter for other specified special examinations: Secondary | ICD-10-CM | POA: Diagnosis not present

## 2022-03-07 LAB — ECHOCARDIOGRAM COMPLETE
Area-P 1/2: 6.54 cm2
S' Lateral: 3 cm

## 2022-03-09 ENCOUNTER — Inpatient Hospital Stay: Payer: BC Managed Care – PPO

## 2022-03-09 VITALS — BP 99/72 | HR 91 | Temp 98.8°F | Resp 18 | Ht 59.0 in | Wt 139.5 lb

## 2022-03-09 DIAGNOSIS — C7951 Secondary malignant neoplasm of bone: Secondary | ICD-10-CM

## 2022-03-09 DIAGNOSIS — Z5111 Encounter for antineoplastic chemotherapy: Secondary | ICD-10-CM | POA: Diagnosis not present

## 2022-03-09 MED ORDER — DENOSUMAB 120 MG/1.7ML ~~LOC~~ SOLN
120.0000 mg | Freq: Once | SUBCUTANEOUS | Status: AC
  Administered 2022-03-09: 120 mg via SUBCUTANEOUS
  Filled 2022-03-09: qty 1.7

## 2022-03-09 NOTE — Patient Instructions (Signed)
Denosumab injection What is this medication? DENOSUMAB (den oh sue mab) slows bone breakdown. Prolia is used to treat osteoporosis in women after menopause and in men, and in people who are taking corticosteroids for 6 months or more. Xgeva is used to treat a high calcium level due to cancer and to prevent bone fractures and other bone problems caused by multiple myeloma or cancer bone metastases. Xgeva is also used to treat giant cell tumor of the bone. This medicine may be used for other purposes; ask your health care provider or pharmacist if you have questions. COMMON BRAND NAME(S): Prolia, XGEVA What should I tell my care team before I take this medication? They need to know if you have any of these conditions: dental disease having surgery or tooth extraction infection kidney disease low levels of calcium or Vitamin D in the blood malnutrition on hemodialysis skin conditions or sensitivity thyroid or parathyroid disease an unusual reaction to denosumab, other medicines, foods, dyes, or preservatives pregnant or trying to get pregnant breast-feeding How should I use this medication? This medicine is for injection under the skin. It is given by a health care professional in a hospital or clinic setting. A special MedGuide will be given to you before each treatment. Be sure to read this information carefully each time. For Prolia, talk to your pediatrician regarding the use of this medicine in children. Special care may be needed. For Xgeva, talk to your pediatrician regarding the use of this medicine in children. While this drug may be prescribed for children as young as 13 years for selected conditions, precautions do apply. Overdosage: If you think you have taken too much of this medicine contact a poison control center or emergency room at once. NOTE: This medicine is only for you. Do not share this medicine with others. What if I miss a dose? It is important not to miss your dose.  Call your doctor or health care professional if you are unable to keep an appointment. What may interact with this medication? Do not take this medicine with any of the following medications: other medicines containing denosumab This medicine may also interact with the following medications: medicines that lower your chance of fighting infection steroid medicines like prednisone or cortisone This list may not describe all possible interactions. Give your health care provider a list of all the medicines, herbs, non-prescription drugs, or dietary supplements you use. Also tell them if you smoke, drink alcohol, or use illegal drugs. Some items may interact with your medicine. What should I watch for while using this medication? Visit your doctor or health care professional for regular checks on your progress. Your doctor or health care professional may order blood tests and other tests to see how you are doing. Call your doctor or health care professional for advice if you get a fever, chills or sore throat, or other symptoms of a cold or flu. Do not treat yourself. This drug may decrease your body's ability to fight infection. Try to avoid being around people who are sick. You should make sure you get enough calcium and vitamin D while you are taking this medicine, unless your doctor tells you not to. Discuss the foods you eat and the vitamins you take with your health care professional. See your dentist regularly. Brush and floss your teeth as directed. Before you have any dental work done, tell your dentist you are receiving this medicine. Do not become pregnant while taking this medicine or for 5 months after   stopping it. Talk with your doctor or health care professional about your birth control options while taking this medicine. Women should inform their doctor if they wish to become pregnant or think they might be pregnant. There is a potential for serious side effects to an unborn child. Talk to  your health care professional or pharmacist for more information. What side effects may I notice from receiving this medication? Side effects that you should report to your doctor or health care professional as soon as possible: allergic reactions like skin rash, itching or hives, swelling of the face, lips, or tongue bone pain breathing problems dizziness jaw pain, especially after dental work redness, blistering, peeling of the skin signs and symptoms of infection like fever or chills; cough; sore throat; pain or trouble passing urine signs of low calcium like fast heartbeat, muscle cramps or muscle pain; pain, tingling, numbness in the hands or feet; seizures unusual bleeding or bruising unusually weak or tired Side effects that usually do not require medical attention (report to your doctor or health care professional if they continue or are bothersome): constipation diarrhea headache joint pain loss of appetite muscle pain runny nose tiredness upset stomach This list may not describe all possible side effects. Call your doctor for medical advice about side effects. You may report side effects to FDA at 1-800-FDA-1088. Where should I keep my medication? This medicine is only given in a clinic, doctor's office, or other health care setting and will not be stored at home. NOTE: This sheet is a summary. It may not cover all possible information. If you have questions about this medicine, talk to your doctor, pharmacist, or health care provider.  2023 Elsevier/Gold Standard (2017-12-06 00:00:00)  

## 2022-03-11 ENCOUNTER — Encounter: Payer: Self-pay | Admitting: Oncology

## 2022-03-12 ENCOUNTER — Other Ambulatory Visit: Payer: Self-pay | Admitting: Hematology and Oncology

## 2022-03-12 DIAGNOSIS — M545 Low back pain, unspecified: Secondary | ICD-10-CM

## 2022-03-12 DIAGNOSIS — R0789 Other chest pain: Secondary | ICD-10-CM

## 2022-03-12 DIAGNOSIS — C7951 Secondary malignant neoplasm of bone: Secondary | ICD-10-CM

## 2022-03-12 MED ORDER — OXYCODONE HCL 10 MG PO TABS: 10.0000 mg | ORAL_TABLET | ORAL | 0 refills | Status: DC | PRN

## 2022-03-13 ENCOUNTER — Other Ambulatory Visit: Payer: Self-pay

## 2022-03-14 ENCOUNTER — Inpatient Hospital Stay: Payer: BC Managed Care – PPO

## 2022-03-14 ENCOUNTER — Inpatient Hospital Stay: Payer: BC Managed Care – PPO | Attending: Oncology | Admitting: Hematology and Oncology

## 2022-03-14 ENCOUNTER — Encounter: Payer: Self-pay | Admitting: Hematology and Oncology

## 2022-03-14 DIAGNOSIS — Z170 Estrogen receptor positive status [ER+]: Secondary | ICD-10-CM

## 2022-03-14 DIAGNOSIS — M542 Cervicalgia: Secondary | ICD-10-CM

## 2022-03-14 DIAGNOSIS — S81802A Unspecified open wound, left lower leg, initial encounter: Secondary | ICD-10-CM | POA: Diagnosis not present

## 2022-03-14 DIAGNOSIS — C50412 Malignant neoplasm of upper-outer quadrant of left female breast: Secondary | ICD-10-CM

## 2022-03-14 DIAGNOSIS — Z5111 Encounter for antineoplastic chemotherapy: Secondary | ICD-10-CM | POA: Insufficient documentation

## 2022-03-14 DIAGNOSIS — C7951 Secondary malignant neoplasm of bone: Secondary | ICD-10-CM | POA: Diagnosis not present

## 2022-03-14 DIAGNOSIS — Z5112 Encounter for antineoplastic immunotherapy: Secondary | ICD-10-CM | POA: Insufficient documentation

## 2022-03-14 LAB — HEPATIC FUNCTION PANEL
ALT: 17 U/L (ref 7–35)
AST: 27 (ref 13–35)
Alkaline Phosphatase: 73 (ref 25–125)
Bilirubin, Total: 0.5

## 2022-03-14 LAB — BASIC METABOLIC PANEL
BUN: 17 (ref 4–21)
CO2: 31 — AB (ref 13–22)
Chloride: 104 (ref 99–108)
Creatinine: 0.7 (ref 0.5–1.1)
Glucose: 97
Potassium: 3.7 mEq/L (ref 3.5–5.1)
Sodium: 141 (ref 137–147)

## 2022-03-14 LAB — CBC AND DIFFERENTIAL
HCT: 39 (ref 36–46)
Hemoglobin: 12.2 (ref 12.0–16.0)
Neutrophils Absolute: 3.74
Platelets: 213 10*3/uL (ref 150–400)
WBC: 5.2

## 2022-03-14 LAB — COMPREHENSIVE METABOLIC PANEL
Albumin: 4 (ref 3.5–5.0)
Calcium: 9 (ref 8.7–10.7)

## 2022-03-14 LAB — CBC: RBC: 4.84 (ref 3.87–5.11)

## 2022-03-14 LAB — MAGNESIUM: Magnesium, Serum: 1.9

## 2022-03-14 NOTE — Assessment & Plan Note (Signed)
She was having problems with stiffness of her neck and pain that she rates as a 7 out of 10.  She was having trouble holding her neck up. She was evaluated by Dr. Gladstone L[facility]. She continues PT 3 days a week and states this has made a great improvement. She continues to gain strength.

## 2022-03-14 NOTE — Progress Notes (Signed)
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 She was on adjuvant hormonal therapy with tamoxifen 20 mg daily from October 2015 to May 2020, but due to elevation of the liver transaminases was switched to anastrazole.  Malignant neoplasm metastatic to bone [facility]) New bone metastases, November 2022, with multiple marrow replacing lesions within the proximal right femur within the ischium as well as the inferior rami. 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 Ki67 was 60%. PET imaging from December 19th revealed dominant finding of intensely hypermetabolic aggressive skeletal metastasis involving the calvarium, sternum, thoracic and lumbar spine, and pelvis, with potential pathologic fracture of the right femur with internal fixation. There is soft tissue extension into the anterior mediastinum associated with the manubrial metastasis. Multiple spinal vertebral body lesions have cortical destruction along the posterior margin. She started monthly Xgeva in December, and recently completed radiation therapy. She was receiving THP and will plan for hormonal therapy.  Cycle 4 was delayed and dose reduction made by 20% due to left leg infection. This is being wrapped daily under the care of the wound clinic with Dr. Coralie Keens. She continues to have leg wrapped  daily and infection was found to be staph. Her heel lesion is almost completely closed and she should be discharged as early as next week from the wound clinic. She will continue with Herceptin/ Perjeta, but we  canceled the chemotherapy. She is now undergoing PT for her neck and back pain and states this is helping. She will proceed with HER2 treatment this week, cycle 8.  Neck pain She was having problems with stiffness of her neck and pain that she rates as a 7 out of 10.  She was having trouble holding her neck up. She was evaluated by Dr. Gladstone Lighter with [facility]. She continues PT 3 days a week and states this has made a great improvement. She continues to gain strength.    Leg wound, left Large wound approximately 10 inches of left lower extremity. This is weeping and appears ulcerated. She also has a similar, but smaller wound noted to her right lower extremity. These began as small "blisters" and have progressed. She has been on Keflex without improvement. Culture was obtained and the wound was growing E. coli.  Her antibiotic was switched to Levaquin and she seemed to improve. After treatment, she noticed the leg becoming worse again. We restarted antibiotics and referred her to the wound clinic. She was evaluated and found to also have a pressure ulcer to her left heel that will continue to be treated by the wound clinic. However, they are unsure as to what to do with the leg wound and recommended dermatology. Right now her leg is wrapped with xeroform, ABD pads and kerlex. They have supplies at home to continue this. Her husband states the skin continues to slough off, but the underlying skin appears normal. She has one more week  left with wound care.    The patient understands the plans discussed today and is in agreement with them.  She knows to contact our office if she develops concerns prior to her next appointment.    Melodye Ped, NP  [facility] [address] [place] [address] Dept: [phone] Dept Fax: [phone]   Orders Placed This Encounter  Procedures   CBC and differential    This external order was created through the Results Console.   CBC    This external order was created through the Results Console.   Basic metabolic panel    This external order was created through the Results Console.   Comprehensive metabolic panel    This external order was created through the Results Console.   Hepatic function panel    This external order was created through the Results Console.   Magnesium    This order was created through External Result Entry      CHIEF COMPLAINT:  CC: A 51 year old female with history of breast cancer here for 3 week evaluation  Current Treatment:  Trastuzumab/ Pertuzumab  INTERVAL HISTORY:  Anna [name] is here today for repeat clinical assessment. She denies fevers or chills. She denies pain. Her appetite is good. Her weight has been stable.  I have reviewed the past medical history, past surgical history, social history and family history with the patient and they are unchanged from previous note.  ALLERGIES:  has No Known Allergies.  MEDICATIONS:  Current Outpatient Medications  Medication Sig Dispense Refill   albuterol (VENTOLIN HFA) 108 (90 Base) MCG/ACT inhaler Inhale 2 puffs into the lungs every 6 (six) hours as needed for shortness of breath.     ARIPiprazole (ABILIFY) 5 MG tablet Take 2.5 mg by mouth daily.     aspirin 81 MG EC tablet Take 81 mg by mouth daily. Swallow whole.     atorvastatin (LIPITOR) 20 MG tablet Take 1 tablet (20 mg total) by mouth daily. 90 tablet 3   augmented betamethasone dipropionate (DIPROLENE-AF) 0.05 % cream betamethasone, augmented 0.05 % topical cream     calcium carbonate (OS-CAL) 1250 (500 Ca) MG chewable tablet Chew 6 tablets by mouth daily.     CLENPIQ 10-3.5-12 MG-GM -GM/160ML SOLN      desvenlafaxine  (PRISTIQ) 50 MG 24 hr tablet Take 50 mg by mouth daily.     diphenoxylate-atropine (LOMOTIL) 2.5-0.025 MG tablet TAKE TWO TABLETS BY MOUTH FOUR TIMES A DAY AS NEEDED FOR DIARRHEA OR LOOSE STOOLS 100 tablet 1   FARXIGA 10 MG TABS tablet Take 1 tablet (10 mg total) by mouth daily. 30 tablet 11   furosemide (LASIX) 20 MG tablet Take 1 tablet (20 mg total) by mouth daily. 30 tablet 0   labetalol (NORMODYNE) 200 MG tablet Take 1 tablet (200 mg total) by mouth 2 (two) times daily. 180 tablet 3   Loratadine 10 MG CAPS Take 1 capsule (10 mg total) by mouth daily. 30 capsule 6   LORazepam (ATIVAN) 1 MG tablet Take 1 tablet (1 mg total) by mouth every 8 (eight) hours. 30 tablet 0   magic mouthwash (lidocaine, diphenhydrAMINE, alum & mag hydroxide) suspension Take 5 mLs by mouth every 3 (three) hours as needed. Swish and spit; for throat/mouth pain     methocarbamol (ROBAXIN) 500 MG tablet methocarbamol 500 mg tablet  1 po q 6 hrs prn spasms     nitroGLYCERIN (NITROSTAT) 0.4 MG  SL tablet Place 0.4 mg under the tongue every 5 (five) minutes as needed for chest pain. (Patient not taking: Reported on 11/23/2021)     omeprazole (PRILOSEC) 40 MG capsule      ondansetron (ZOFRAN) 4 MG tablet Take 1 tablet (4 mg total) by mouth every 4 (four) hours as needed for nausea. 90 tablet 3   Oxycodone HCl 10 MG TABS Take 1 tablet (10 mg total) by mouth every 4 (four) hours as needed. 120 tablet 0   OZEMPIC, 0.25 OR 0.5 MG/DOSE, 2 MG/1.5ML SOPN Inject into the skin.     potassium chloride SA (KLOR-CON M) 20 MEQ tablet Take 1 tablet (20 mEq total) by mouth 2 (two) times daily. 60 tablet 0   prochlorperazine (COMPAZINE) 10 MG tablet Take 1 tablet (10 mg total) by mouth every 6 (six) hours as needed for nausea or vomiting. 90 tablet 3   topiramate (TOPAMAX) 50 MG tablet Take 50 mg by mouth daily.     traMADol (ULTRAM) 50 MG tablet      valsartan (DIOVAN) 40 MG tablet Take 1 tablet (40 mg total) by mouth daily. 90 tablet 3    Vitamin D, Ergocalciferol, (DRISDOL) 1.25 MG (50000 UNIT) CAPS capsule Take 50,000 Units by mouth once a week.     Current Facility-Administered Medications  Medication Dose Route Frequency Provider Last Rate Last Admin   0.9 %  sodium chloride infusion  500 mL Intravenous Once Jackquline Denmark, MD       technetium sestamibi generic (CARDIOLITE) injection 48.8 millicurie  89.1 millicurie Intravenous Once PRN Revankar, Reita Cliche, MD        HISTORY OF PRESENT ILLNESS:   Oncology History  Malignant neoplasm of upper-outer quadrant of left female breast ([place])  07/23/2013 Initial Diagnosis   Malignant neoplasm of upper-outer quadrant of left female breast ([place])   07/23/2013 Cancer Staging   Staging form: Breast, AJCC 7th Edition - Clinical stage from 07/23/2013: Stage IIB (T2, N1, M0) - Signed by Derwood Kaplan, MD on 07/04/2020 Prognostic indicators: Pos LVI   09/22/2021 -  Chemotherapy   Patient is on Treatment Plan : BREAST  Trastuzumab + Pertuzumab q21d      Malignant neoplasm metastatic to bone ([place])  06/28/2021 Initial Diagnosis   Bone metastases ([place])   09/22/2021 -  Chemotherapy   Patient is on Treatment Plan : BREAST  Trastuzumab + Pertuzumab q21d          REVIEW OF SYSTEMS:   Constitutional: Denies fevers, chills or abnormal weight loss Eyes: Denies blurriness of vision Ears, nose, mouth, throat, and face: Denies mucositis or sore throat Respiratory: Denies cough, dyspnea or wheezes Cardiovascular: Denies palpitation, chest discomfort or lower extremity swelling Gastrointestinal:  Denies nausea, heartburn or change in bowel habits Skin: Denies abnormal skin rashes Lymphatics: Denies new lymphadenopathy or easy bruising Neurological:Denies numbness, tingling or new weaknesses Behavioral/Psych: Mood is stable, no new changes  All other systems were reviewed with the patient and are negative.   VITALS:  Blood pressure (!) 95/59, pulse 94, temperature 98.2 F (36.8  C), temperature source Oral, resp. rate 18, height _0  (1.499 m), weight 138 lb 8 oz (62.8 kg), last menstrual period 08/18/2013, SpO2 99 %.  Wt Readings from Last 3 Encounters:  03/14/22 138 lb 8 oz (62.8 kg)  03/09/22 139 lb 8 oz (63.3 kg)  02/23/22 141 lb (64 kg)    Body mass index is 27.97 kg/m.  Performance status (ECOG): 1 - Symptomatic but  completely ambulatory  PHYSICAL EXAM:   GENERAL:alert, no distress and comfortable SKIN: skin color, texture, turgor are normal, no rashes or significant lesions EYES: normal, Conjunctiva are pink and non-injected, sclera clear OROPHARYNX:no exudate, no erythema and lips, buccal mucosa, and tongue normal  NECK: supple, thyroid normal size, non-tender, without nodularity LYMPH:  no palpable lymphadenopathy in the cervical, axillary or inguinal LUNGS: clear to auscultation and percussion with normal breathing effort HEART: regular rate & rhythm and no murmurs and no lower extremity edema ABDOMEN:abdomen soft, non-tender and normal bowel sounds Musculoskeletal:no cyanosis of digits and no clubbing  NEURO: alert & oriented x 3 with fluent speech, no focal motor/sensory deficits  LABORATORY DATA:  I have reviewed the data as listed    Component Value Date/Time   NA 141 03/14/2022 0000   K 3.7 03/14/2022 0000   CL 104 03/14/2022 0000   CO2 31 (A) 03/14/2022 0000   GLUCOSE 147 (H) 07/05/2021 0330   BUN 17 03/14/2022 0000   CREATININE 0.7 03/14/2022 0000   CREATININE 0.71 07/05/2021 0330   CALCIUM 9.0 03/14/2022 0000   PROT 7.7 06/30/2021 0833   ALBUMIN 4.0 03/14/2022 0000   AST 27 03/14/2022 0000   ALT 17 03/14/2022 0000   ALKPHOS 73 03/14/2022 0000   BILITOT 0.7 06/30/2021 0833   GFRNONAA >60 07/05/2021 0330    No results found for: "SPEP", "UPEP"  Lab Results  Component Value Date   WBC 5.2 03/14/2022   NEUTROABS 3.74 03/14/2022   HGB 12.2 03/14/2022   HCT 39 03/14/2022   MCV 79 (A) 09/06/2021   PLT 213 03/14/2022       Chemistry      Component Value Date/Time   NA 141 03/14/2022 0000   K 3.7 03/14/2022 0000   CL 104 03/14/2022 0000   CO2 31 (A) 03/14/2022 0000   BUN 17 03/14/2022 0000   CREATININE 0.7 03/14/2022 0000   CREATININE 0.71 07/05/2021 0330   GLU 97 03/14/2022 0000      Component Value Date/Time   CALCIUM 9.0 03/14/2022 0000   ALKPHOS 73 03/14/2022 0000   AST 27 03/14/2022 0000   ALT 17 03/14/2022 0000   BILITOT 0.7 06/30/2021 1610       RADIOGRAPHIC STUDIES: I have personally reviewed the radiological images as listed and agreed with the findings in the report. ECHOCARDIOGRAM COMPLETE  Result Date: 03/07/2022    ECHOCARDIOGRAM REPORT   Patient Name:   Anna [name] [facility] Date of Exam: 03/07/2022 Medical Rec #:  [id number]           Height:       59.0 in Accession #:    [id number]          Weight:       141.0 lb Date of Birth:  [birth year]          BSA:          1.590 m Patient Age:    51 years            BP:           99/69 mmHg Patient Gender: F                   HR:           96 bpm. Exam Location:   Procedure: 2D Echo, Cardiac Doppler and Color Doppler Indications:    Malignant neoplasm metastatic to bone ([facility]) [C79.51 (ICD-10-CM)]  History:  Patient has prior history of Echocardiogram examinations, most                 recent 11/20/2021. Risk Factors:Hypertension and Dyslipidemia.  Sonographer:    Philipp Deputy RDCS Referring Phys: [place] Comments: Suboptimal subcostal window and suboptimal apical window. Image acquisition challenging due to uncooperative patient. Patient refused the use of Definity. IMPRESSIONS  1. TDS, unable to perform GLS. Left ventricular ejection fraction, by estimation, is 55 to 60%. The left ventricle has normal function. The left ventricle has no regional wall motion abnormalities. Left ventricular diastolic parameters were normal.  2. Right ventricular systolic function is normal. The right ventricular size is  normal.  3. The mitral valve is normal in structure. No evidence of mitral valve regurgitation. No evidence of mitral stenosis.  4. The aortic valve is normal in structure. Aortic valve regurgitation is not visualized. No aortic stenosis is present.  5. The inferior vena cava is normal in size with greater than 50% respiratory variability, suggesting right atrial pressure of 3 mmHg. FINDINGS  Left Ventricle: TDS, unable to perform GLS. Left ventricular ejection fraction, by estimation, is 55 to 60%. The left ventricle has normal function. The left ventricle has no regional wall motion abnormalities. The left ventricular internal cavity size was normal in size. There is no left ventricular hypertrophy. Left ventricular diastolic parameters were normal. Right Ventricle: The right ventricular size is normal. No increase in right ventricular wall thickness. Right ventricular systolic function is normal. Left Atrium: Left atrial size was normal in size. Right Atrium: Right atrial size was normal in size. Pericardium: There is no evidence of pericardial effusion. Mitral Valve: The mitral valve is normal in structure. No evidence of mitral valve regurgitation. No evidence of mitral valve stenosis. Tricuspid Valve: The tricuspid valve is normal in structure. Tricuspid valve regurgitation is not demonstrated. No evidence of tricuspid stenosis. Aortic Valve: The aortic valve is normal in structure. Aortic valve regurgitation is not visualized. No aortic stenosis is present. Pulmonic Valve: The pulmonic valve was normal in structure. Pulmonic valve regurgitation is not visualized. No evidence of pulmonic stenosis. Aorta: The aortic root is normal in size and structure. Venous: The inferior vena cava is normal in size with greater than 50% respiratory variability, suggesting right atrial pressure of 3 mmHg. IAS/Shunts: No atrial level shunt detected by color flow Doppler.  LEFT VENTRICLE PLAX 2D LVIDd:         3.80 cm    Diastology LVIDs:         3.00 cm   LV e' medial:    9.79 cm/s LV PW:         0.90 cm   LV E/e' medial:  7.6 LV IVS:        0.80 cm   LV e' lateral:   13.10 cm/s LVOT diam:     1.80 cm   LV E/e' lateral: 5.7 LV SV:         41 LV SV Index:   26 LVOT Area:     2.54 cm  LEFT ATRIUM           Index LA diam:      2.30 cm 1.45 cm/m LA Vol (A4C): 29.0 ml 18.24 ml/m  AORTIC VALVE LVOT Vmax:   79.60 cm/s LVOT Vmean:  52.600 cm/s LVOT VTI:    0.160 m  AORTA Ao Root diam: 3.10 cm Ao Asc diam:  2.40 cm MITRAL VALVE MV Area (PHT):  6.54 cm    SHUNTS MV Decel Time: 116 msec    Systemic VTI:  0.16 m MV E velocity: 74.60 cm/s  Systemic Diam: 1.80 cm MV A velocity: 61.30 cm/s MV E/A ratio:  1.22 Jenne Campus MD Electronically signed by Jenne Campus MD Signature Date/Time: 03/07/2022/12:33:07 PM    Final

## 2022-03-14 NOTE — Assessment & Plan Note (Signed)
New bone metastases, November 2022, with multiple marrow replacing lesions within the proximal right femur within the ischium as well as the inferior rami. 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 Ki67 was 60%. PET imaging from December 19th revealed dominant finding of intensely hypermetabolic aggressive skeletal metastasis involving the calvarium, sternum, thoracic and lumbar spine, and pelvis, with potential pathologic fracture of the right femur with internal fixation. There is soft tissue extension into the anterior mediastinum associated with the manubrial metastasis. Multiple spinal vertebral body lesions have cortical destruction along the posterior margin.She started monthly Xgeva in December, and recently completed radiation therapy.She was receivingTHP andwill plan forhormonal therapy.  Cycle 4 was delayed and dose reduction made by 20% due to left leg infection.This is being wrapped daily under the care of the wound clinic with Dr. Coralie Keens. She continues to have leg wrapped daily and infection was found to be staph. Her heel lesion is almost completely closed and she should be discharged as early as next week from the wound clinic. She will continue with Herceptin/ Perjeta, but we  canceled the chemotherapy. She is now undergoing PT for her neck and back pain and states this is helping. She will proceed with HER2 treatment this week, cycle 8.

## 2022-03-14 NOTE — Progress Notes (Signed)
Face to face visit with pt in lobby. Pt reports that she has stopped chemo. She is continuing with maintenance therapy, Herceptin / Perjetta but reports that dhe is feeling much better . She appears to be much more comfortable as she is smiling and her color is much improved from our last face to face visit.

## 2022-03-14 NOTE — Assessment & Plan Note (Signed)
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 She was on adjuvant hormonal therapy with tamoxifen 20 mg daily from October 2015 to May 2020, but due to elevation of the liver transaminases was switched to anastrazole.

## 2022-03-14 NOTE — Assessment & Plan Note (Signed)
Large wound approximately 10 inches of left lower extremity. This is weeping and appears ulcerated. She also has a similar, but smaller wound noted to her right lower extremity. These began as small "blisters" and have progressed. She has been on Keflex without improvement.Culture was obtainedand the wound wasgrowing E. coli. Her antibiotic was switched to Levaquin and she seemed to improve. After treatment, she noticed the leg becoming worse again. We restarted antibiotics and referred her to the wound clinic. She was evaluated and found to also have a pressure ulcer to her left heel that will continue to be treated by the wound clinic. However, they are unsure as to what to do with the leg wound and recommended dermatology. Right now her leg is wrapped with xeroform, ABD pads and kerlex. They have supplies at home to continue this. Her husband states the skin continues to slough off, but the underlying skin appears normal.She has one more week left with wound care.

## 2022-03-15 MED FILL — Pertuzumab Soln for IV Infusion 420 MG/14ML (30 MG/ML): INTRAVENOUS | Qty: 14 | Status: AC

## 2022-03-15 MED FILL — Trastuzumab-dkst For IV Soln 150 MG: INTRAVENOUS | Qty: 19 | Status: AC

## 2022-03-16 ENCOUNTER — Inpatient Hospital Stay: Payer: BC Managed Care – PPO

## 2022-03-16 VITALS — BP 103/63 | HR 98 | Temp 98.1°F | Resp 18 | Ht 59.0 in | Wt 140.0 lb

## 2022-03-16 DIAGNOSIS — C7951 Secondary malignant neoplasm of bone: Secondary | ICD-10-CM

## 2022-03-16 DIAGNOSIS — Z5111 Encounter for antineoplastic chemotherapy: Secondary | ICD-10-CM | POA: Diagnosis present

## 2022-03-16 DIAGNOSIS — Z5112 Encounter for antineoplastic immunotherapy: Secondary | ICD-10-CM | POA: Diagnosis present

## 2022-03-16 DIAGNOSIS — Z170 Estrogen receptor positive status [ER+]: Secondary | ICD-10-CM | POA: Diagnosis not present

## 2022-03-16 DIAGNOSIS — C50412 Malignant neoplasm of upper-outer quadrant of left female breast: Secondary | ICD-10-CM | POA: Diagnosis present

## 2022-03-16 MED ORDER — SODIUM CHLORIDE 0.9% FLUSH
10.0000 mL | INTRAVENOUS | Status: DC | PRN
  Administered 2022-03-16: 10 mL

## 2022-03-16 MED ORDER — SODIUM CHLORIDE 0.9 % IV SOLN
420.0000 mg | Freq: Once | INTRAVENOUS | Status: AC
  Administered 2022-03-16: 420 mg via INTRAVENOUS
  Filled 2022-03-16: qty 14

## 2022-03-16 MED ORDER — SODIUM CHLORIDE 0.9 % IV SOLN: Freq: Once | INTRAVENOUS | Status: AC

## 2022-03-16 MED ORDER — HEPARIN SOD (PORK) LOCK FLUSH 100 UNIT/ML IV SOLN
500.0000 [IU] | Freq: Once | INTRAVENOUS | Status: AC | PRN
  Administered 2022-03-16: 500 [IU]

## 2022-03-16 MED ORDER — ACETAMINOPHEN 325 MG PO TABS
650.0000 mg | ORAL_TABLET | Freq: Once | ORAL | Status: AC
  Administered 2022-03-16: 650 mg via ORAL
  Filled 2022-03-16: qty 2

## 2022-03-16 MED ORDER — TRASTUZUMAB-DKST CHEMO 150 MG IV SOLR
6.0000 mg/kg | Freq: Once | INTRAVENOUS | Status: AC
  Administered 2022-03-16: 399 mg via INTRAVENOUS
  Filled 2022-03-16: qty 19

## 2022-03-16 MED ORDER — DIPHENHYDRAMINE HCL 25 MG PO CAPS
50.0000 mg | ORAL_CAPSULE | Freq: Once | ORAL | Status: AC
  Administered 2022-03-16: 50 mg via ORAL
  Filled 2022-03-16: qty 2

## 2022-03-16 NOTE — Patient Instructions (Signed)
Pertuzumab Injection What is this medication? PERTUZUMAB (per TOOZ ue mab) treats breast cancer. It works by blocking a protein that causes cancer cells to grow and multiply. This helps to slow or stop the spread of cancer cells. It is a monoclonal antibody. This medicine may be used for other purposes; ask your health care provider or pharmacist if you have questions. COMMON BRAND NAME(S): PERJETA What should I tell my care team before I take this medication? They need to know if you have any of these conditions: Heart failure An unusual or allergic reaction to pertuzumab, other medications, foods, dyes, or preservatives Pregnant or trying to get pregnant Breast-feeding How should I use this medication? This medication is injected into a vein. It is given by your care team in a hospital or clinic setting. Talk to your care team about the use of this medication in children. Special care may be needed. Overdosage: If you think you have taken too much of this medicine contact a poison control center or emergency room at once. NOTE: This medicine is only for you. Do not share this medicine with others. What if I miss a dose? Keep appointments for follow-up doses. It is important not to miss your dose. Call your care team if you are unable to keep an appointment. What may interact with this medication? Interactions are not expected. This list may not describe all possible interactions. Give your health care provider a list of all the medicines, herbs, non-prescription drugs, or dietary supplements you use. Also tell them if you smoke, drink alcohol, or use illegal drugs. Some items may interact with your medicine. What should I watch for while using this medication? Your condition will be monitored carefully while you are receiving this medication. This medication may make you feel generally unwell. This is not uncommon as chemotherapy can affect healthy cells as well as cancer cells. Report any  side effects. Continue your course of treatment even though you feel ill unless your care team tells you to stop. Talk to your care team if you may be pregnant. Serious birth defects can occur if you take this medication during pregnancy and for 7 months after the last dose. You will need a negative pregnancy test before starting this medication. Contraception is recommended while taking this medication and for 7 months after the last dose. Your care team can help you find the option that works for you. Do not breastfeed while taking this medication and for 7 months after the last dose. What side effects may I notice from receiving this medication? Side effects that you should report to your care team as soon as possible: Allergic reactions or angioedema--skin rash, itching or hives, swelling of the face, eyes, lips, tongue, arms, or legs, trouble swallowing or breathing Heart failure--shortness of breath, swelling of the ankles, feet, or hands, sudden weight gain, unusual weakness or fatigue Infusion reactions--chest pain, shortness of breath or trouble breathing, feeling faint or lightheaded Side effects that usually do not require medical attention (report to your care team if they continue or are bothersome): Diarrhea Dry skin Fatigue Hair loss Nausea Vomiting This list may not describe all possible side effects. Call your doctor for medical advice about side effects. You may report side effects to FDA at 1-800-FDA-1088. Where should I keep my medication? This medication is given in a hospital or clinic. It will not be stored at home. NOTE: This sheet is a summary. It may not cover all possible information. If you have  questions about this medicine, talk to your doctor, pharmacist, or health care provider.  2023 Elsevier/Gold Standard (2021-11-30 00:00:00) Trastuzumab Injection What is this medication? TRASTUZUMAB (tras TOO zoo mab) treats breast cancer and stomach cancer. It works by  blocking a protein that causes cancer cells to grow and multiply. This helps to slow or stop the spread of cancer cells. This medicine may be used for other purposes; ask your health care provider or pharmacist if you have questions. COMMON BRAND NAME(S): Herceptin, Janae Bridgeman, Ontruzant, Trazimera What should I tell my care team before I take this medication? They need to know if you have any of these conditions: Heart failure Lung disease An unusual or allergic reaction to trastuzumab, other medications, foods, dyes, or preservatives Pregnant or trying to get pregnant Breast-feeding How should I use this medication? This medication is injected into a vein. It is given by your care team in a hospital or clinic setting. Talk to your care team about the use of this medication in children. It is not approved for use in children. Overdosage: If you think you have taken too much of this medicine contact a poison control center or emergency room at once. NOTE: This medicine is only for you. Do not share this medicine with others. What if I miss a dose? Keep appointments for follow-up doses. It is important not to miss your dose. Call your care team if you are unable to keep an appointment. What may interact with this medication? Certain types of chemotherapy, such as daunorubicin, doxorubicin, epirubicin, idarubicin This list may not describe all possible interactions. Give your health care provider a list of all the medicines, herbs, non-prescription drugs, or dietary supplements you use. Also tell them if you smoke, drink alcohol, or use illegal drugs. Some items may interact with your medicine. What should I watch for while using this medication? Your condition will be monitored carefully while you are receiving this medication. This medication may make you feel generally unwell. This is not uncommon, as chemotherapy affects healthy cells as well as cancer cells. Report any side  effects. Continue your course of treatment even though you feel ill unless your care team tells you to stop. This medication may increase your risk of getting an infection. Call your care team for advice if you get a fever, chills, sore throat, or other symptoms of a cold or flu. Do not treat yourself. Try to avoid being around people who are sick. Avoid taking medications that contain aspirin, acetaminophen, ibuprofen, naproxen, or ketoprofen unless instructed by your care team. These medications can hide a fever. Talk to your care team if you may be pregnant. Serious birth defects can occur if you take this medication during pregnancy and for 7 months after the last dose. You will need a negative pregnancy test before starting this medication. Contraception is recommended while taking this medication and for 7 months after the last dose. Your care team can help you find the option that works for you. Do not breastfeed while taking this medication and for 7 months after stopping treatment. What side effects may I notice from receiving this medication? Side effects that you should report to your care team as soon as possible: Allergic reactions or angioedema--skin rash, itching or hives, swelling of the face, eyes, lips, tongue, arms, or legs, trouble swallowing or breathing Dry cough, shortness of breath or trouble breathing Heart failure--shortness of breath, swelling of the ankles, feet, or hands, sudden weight gain, unusual weakness  or fatigue Infection--fever, chills, cough, or sore throat Infusion reactions--chest pain, shortness of breath or trouble breathing, feeling faint or lightheaded Side effects that usually do not require medical attention (report to your care team if they continue or are bothersome): Diarrhea Dizziness Headache Nausea Trouble sleeping Vomiting This list may not describe all possible side effects. Call your doctor for medical advice about side effects. You may report  side effects to FDA at 1-800-FDA-1088. Where should I keep my medication? This medication is given in a hospital or clinic. It will not be stored at home. NOTE: This sheet is a summary. It may not cover all possible information. If you have questions about this medicine, talk to your doctor, pharmacist, or health care provider.  2023 Elsevier/Gold Standard (2021-12-12 00:00:00)

## 2022-03-21 IMAGING — PT NM PET TUM IMG INITIAL (PI) WHOLE BODY
7 series · 25 of 25 positions shown · non-contrast
Comparison: None.

CLINICAL DATA: Subsequent treatment strategy for breast carcinoma.
Skeletal metastasis.

EXAM:
NUCLEAR MEDICINE PET WHOLE BODY
TECHNIQUE: Sub mCi F-18 FDG was injected intravenously. Full-ring PET imaging
was performed from the head to foot after the radiotracer. CT data
was obtained and used for attenuation correction and anatomic
localization.
Fasting blood glucose: 107 mg/dl

[Series 3: pet wb ac · axial · 5.0mm · 4.07mm/px · z∈[+231,+1887]mm · 5 of 415 slices shown]
[im 1/415]
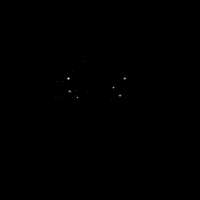
[im 104/415]
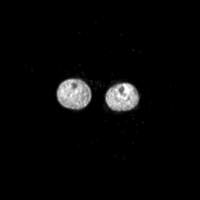
[im 208/415]
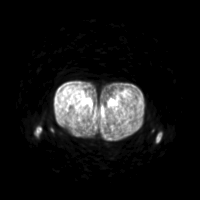
[im 311/415]
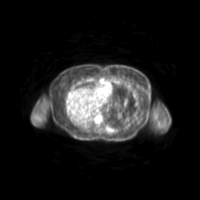
[im 415/415]
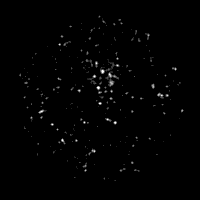

[Series 4: ct wb 5.0 hd_fov · axial · 5.0mm · 1.52mm/px · z∈[+231,+1887]mm · 6 of 415 slices shown]
[im 1/415  full-range]
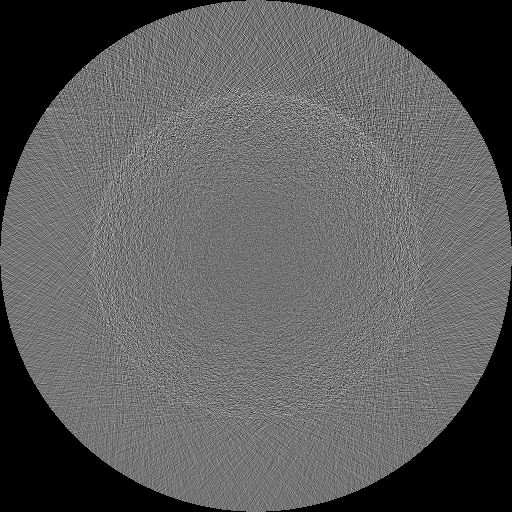
[im 83/415  soft-tissue]
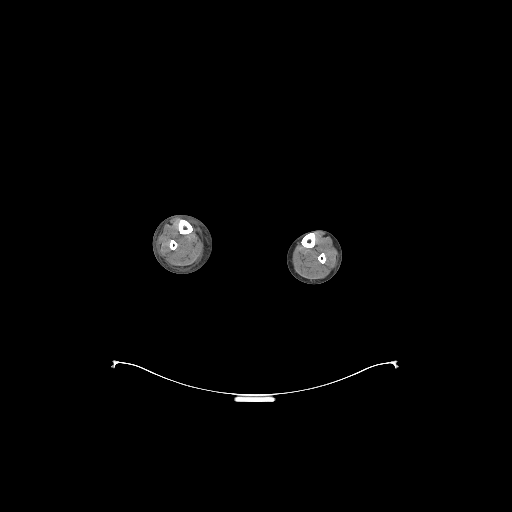
[im 166/415  soft-tissue]
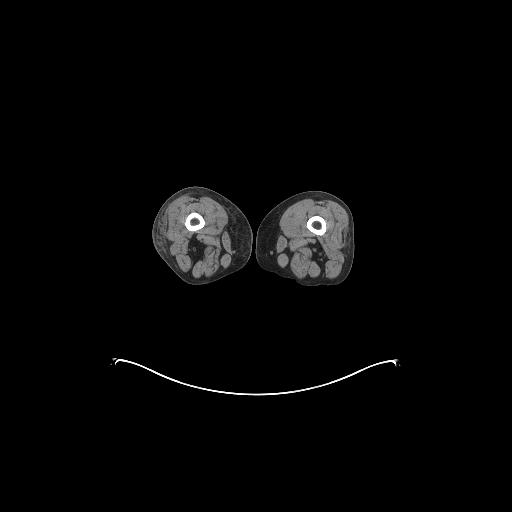
[im 249/415  soft-tissue]
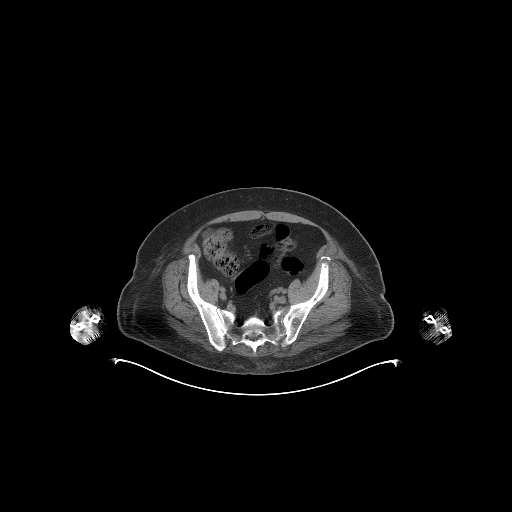
[im 332/415  soft-tissue]
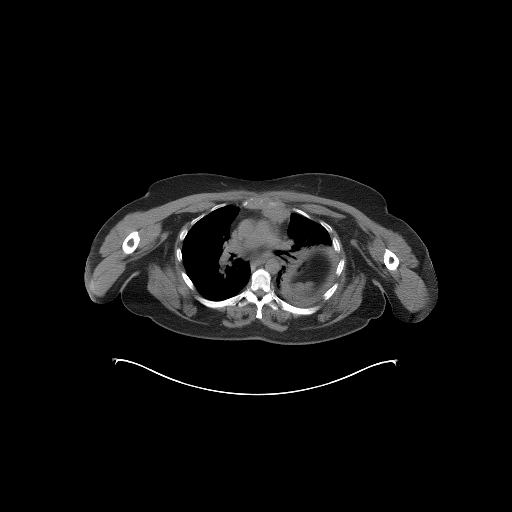
[im 415/415  soft-tissue]
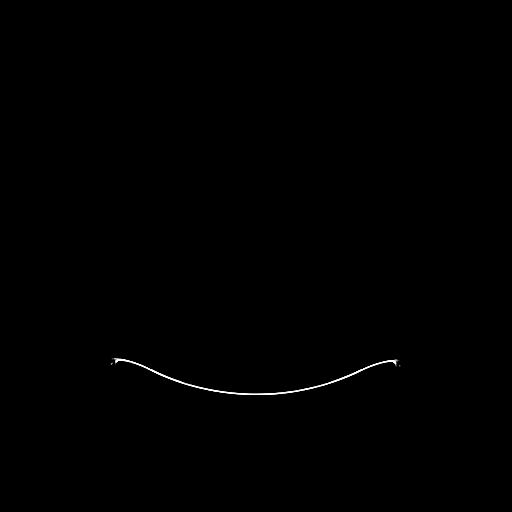

[Series 5: pet wb nac · axial · 5.0mm · 4.07mm/px · z∈[+235,+1887]mm · 6 of 414 slices shown]
[im 1/414  full-range]
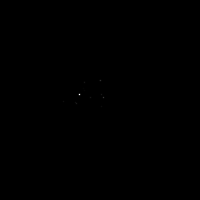
[im 83/414]
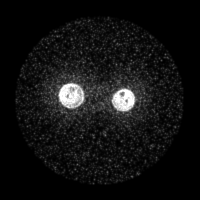
[im 166/414]
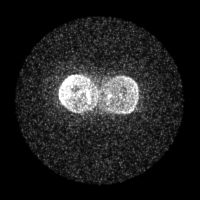
[im 248/414]
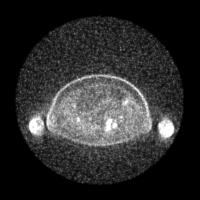
[im 331/414]
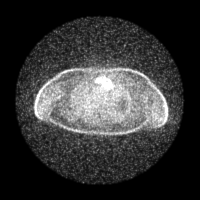
[im 414/414]
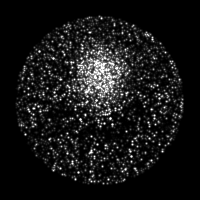

[Series 8: ct wb 5.0 br59 lung_bone · axial · 5.0mm · 0.98mm/px · 1 of 70 slices shown]
[im 1/70  lung]
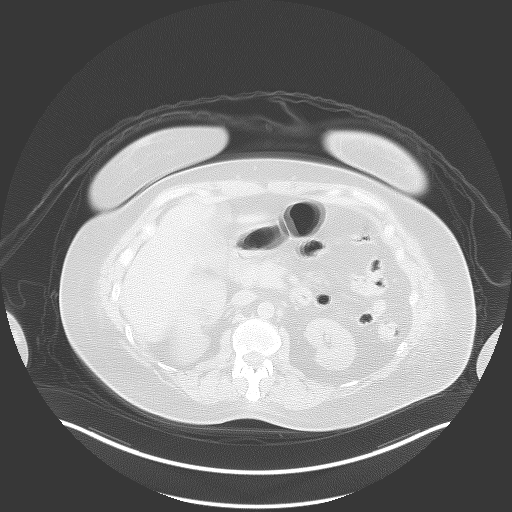

[Series 603: <mip collection> · coronal · 3.43mm/px · 1 of 32 slices shown]
[im 1/32]
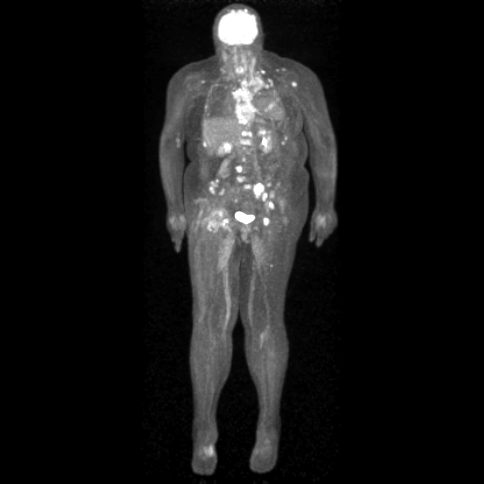

[Series 604: fused cor · 1 of 57 slices shown]
[im 1/57]
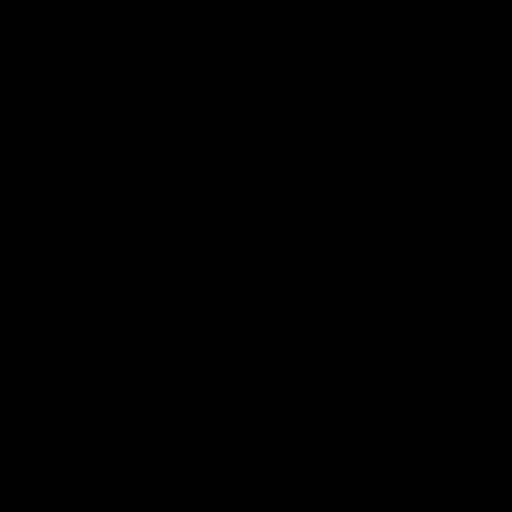

[Series 605: range-ct wb 5.0 hd_fov-tra-<alpha range> · 5 of 393 slices shown]
[im 1/393]
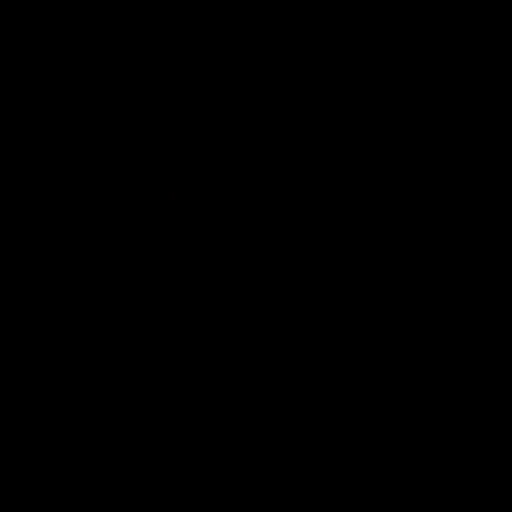
[im 99/393]
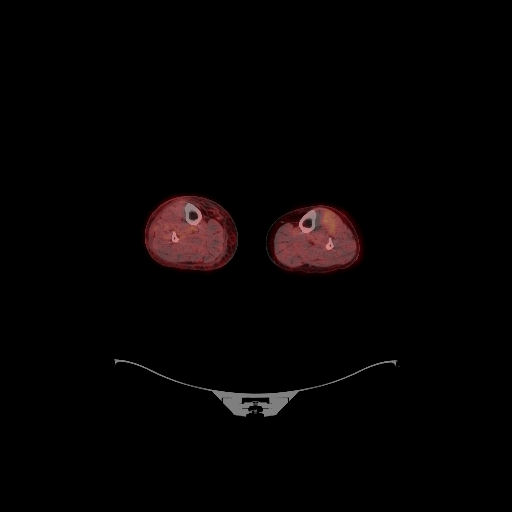
[im 197/393]
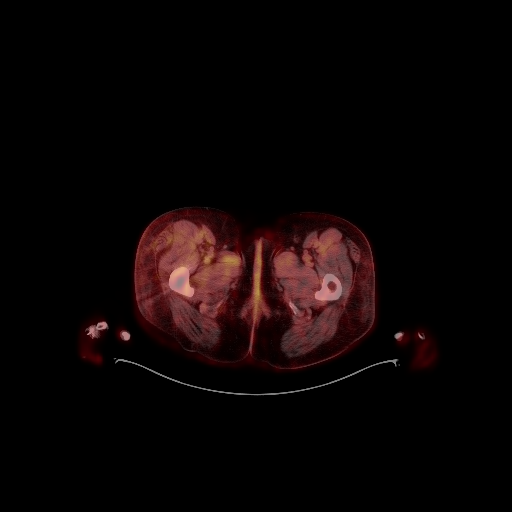
[im 295/393]
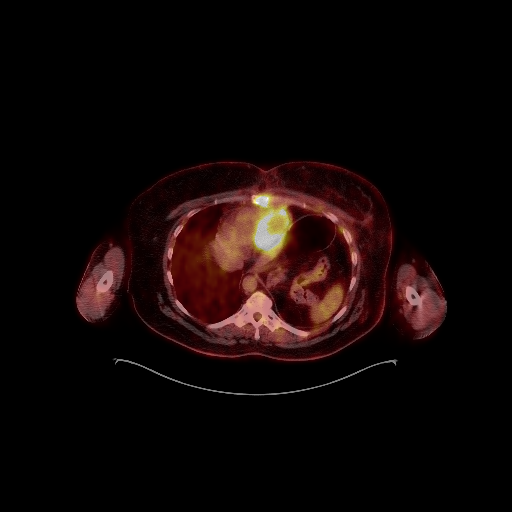
[im 393/393]
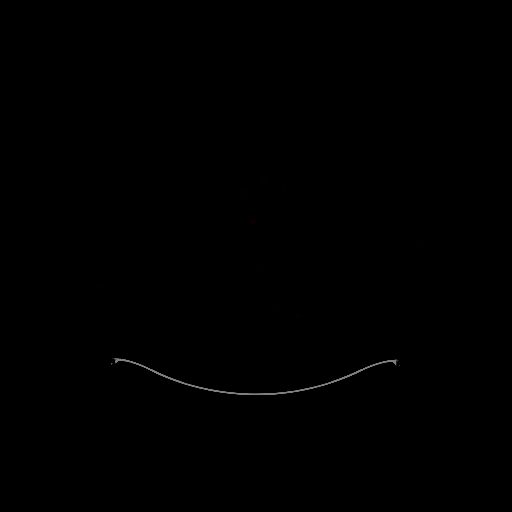

[25 of 25 positions shown; findings below may reference images not displayed]

FINDINGS: Mediastinal blood pool activity: SUV max

HEAD/NECK: No hypermetabolic lymph nodes in the neck. Hypermetabolic
activity associated with the lytic lesions in the high calvarium
over the LEFT and RIGHT cerebral convexity. Example lytic lesion
measuring 1 cm on image 10 with SUV max equal 9.0.

Incidental CT findings: none

CHEST: Extensive expansile hypermetabolic mass involving the
manubrium and sternum. Soft tissue extension into the anterior
mediastinum measures 3.7 cm on image 83/CT series 4 with SUV max
equal 11.2. Hypermetabolic activity involving the entirety of the
sternum with SUV max equal 9.4. There is cortical destruction
associated with the metabolic activity.

No hypermetabolic mediastinal lymph nodes.

No suspicious pulmonary nodules.

Incidental CT findings: Marked elevation of the LEFT hemidiaphragm
with associated LEFT lobe atelectasis.

ABDOMEN/PELVIS: No abnormal activity in liver. Adrenal glands
normal. No hypermetabolic abdominopelvic lymph nodes. Uterus and
adnexa are normal.

Incidental CT findings: none

SKELETON: Multifocal skeletal metastasis in the pelvis, spine, ribs
and calvarium. Example expansile lucent lesion in LEFT sacrum
measuring 3.5 cm with SUV max equal 15.1. Intense metabolic activity
associated with recent RIGHT hip replacement. Potential pathologic
fracture. Intense activity associated with the LEFT and RIGHT
inferior pubic rami with SUV max equal 11.8 on image 190)

Multiple hypermetabolic lesions in the thoracic lumbar spine. There
is evidence cortical destruction along the posterior margin of
several vertebral bodies. For example lesions at T3-T5.

Concern sternal manubrial metastasis with soft tissue extension
described in chest section

Incidental CT findings: none

EXTREMITIES: No abnormal hypermetabolic activity in the lower
extremities.

Incidental CT findings: none
IMPRESSION: 1. Dominant finding is intensely hypermetabolic aggressive skeletal
metastasis involving the calvarium, sternum, thoracic and lumbar
spine, and pelvis.
2. Potential pathologic fracture of the RIGHT femur with internal
fixation.
3. Soft tissue extension into the anterior mediastinum associated
with the manubrial metastasis.
4. Multiple spinal vertebral body lesions have cortical destruction
along the posterior margin.
5. Incidental finding of marked elevation of the LEFT hemidiaphragm.
These results will be called to the ordering clinician or
representative by the Radiologist Assistant, and communication
documented in the PACS or [REDACTED].

## 2022-04-01 ENCOUNTER — Other Ambulatory Visit: Payer: Self-pay | Admitting: Oncology

## 2022-04-04 ENCOUNTER — Inpatient Hospital Stay (INDEPENDENT_AMBULATORY_CARE_PROVIDER_SITE_OTHER): Payer: BC Managed Care – PPO | Admitting: Hematology and Oncology

## 2022-04-04 ENCOUNTER — Inpatient Hospital Stay: Payer: BC Managed Care – PPO

## 2022-04-04 ENCOUNTER — Encounter: Payer: Self-pay | Admitting: Hematology and Oncology

## 2022-04-04 ENCOUNTER — Other Ambulatory Visit: Payer: Self-pay | Admitting: Hematology and Oncology

## 2022-04-04 DIAGNOSIS — C50412 Malignant neoplasm of upper-outer quadrant of left female breast: Secondary | ICD-10-CM

## 2022-04-04 DIAGNOSIS — C7951 Secondary malignant neoplasm of bone: Secondary | ICD-10-CM

## 2022-04-04 DIAGNOSIS — Z170 Estrogen receptor positive status [ER+]: Secondary | ICD-10-CM | POA: Diagnosis not present

## 2022-04-04 DIAGNOSIS — M545 Low back pain, unspecified: Secondary | ICD-10-CM

## 2022-04-04 DIAGNOSIS — R0789 Other chest pain: Secondary | ICD-10-CM

## 2022-04-04 LAB — COMPREHENSIVE METABOLIC PANEL
Albumin: 4 (ref 3.5–5.0)
Calcium: 9.4 (ref 8.7–10.7)

## 2022-04-04 LAB — HEPATIC FUNCTION PANEL
ALT: 16 U/L (ref 7–35)
AST: 23 (ref 13–35)
Alkaline Phosphatase: 89 (ref 25–125)
Bilirubin, Total: 0.5

## 2022-04-04 LAB — BASIC METABOLIC PANEL
BUN: 14 (ref 4–21)
CO2: 29 — AB (ref 13–22)
Chloride: 106 (ref 99–108)
Creatinine: 0.6 (ref 0.5–1.1)
Glucose: 81
Potassium: 3.7 mEq/L (ref 3.5–5.1)
Sodium: 141 (ref 137–147)

## 2022-04-04 LAB — CBC AND DIFFERENTIAL
HCT: 38 (ref 36–46)
Hemoglobin: 11.7 — AB (ref 12.0–16.0)
Neutrophils Absolute: 4.15
Platelets: 237 10*3/uL (ref 150–400)
WBC: 6.1

## 2022-04-04 LAB — MAGNESIUM: Magnesium: 2

## 2022-04-04 LAB — CBC: RBC: 4.75 (ref 3.87–5.11)

## 2022-04-04 MED ORDER — OXYCODONE HCL 10 MG PO TABS: 10.0000 mg | ORAL_TABLET | ORAL | 0 refills | Status: DC | PRN

## 2022-04-04 MED ORDER — LORATADINE 10 MG PO CAPS: 10.0000 mg | ORAL_CAPSULE | Freq: Every day | ORAL | 6 refills | Status: DC

## 2022-04-04 NOTE — Assessment & Plan Note (Addendum)
New bone metastases, November 2022, with multiple marrow replacing lesions within the proximal right femur within the ischium as well as the inferior rami. 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 Ki67 was 60%. PET imaging from December 19th revealed dominant finding of intensely hypermetabolic aggressive skeletal metastasis involving the calvarium, sternum, thoracic and lumbar spine, and pelvis, with potential pathologic fracture of the right femur with internal fixation. There is soft tissue extension into the anterior mediastinum associated with the manubrial metastasis. Multiple spinal vertebral body lesions have cortical destruction along the posterior margin.She started monthly Xgeva in December, and recently completed radiation therapy.She was receivingTHP andwill plan forhormonal therapy.  Cycle 4 was delayed and dose reduction made by 20% due to left leg infection.This is being wrapped daily under the care of the wound clinic with Dr. Coralie Keens. She continues to have leg wrapped daily and infection was found to be staph. Her heel lesion is almost completely closed and she should be discharged as early as next week from the wound clinic. She will continue with Herceptin/ Perjeta, but we  canceled the chemotherapy. She is now undergoing PT for her neck and back pain and states this is helping. She will proceed with HER2 treatment this week, cycle 9.

## 2022-04-04 NOTE — Assessment & Plan Note (Signed)
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 She was on adjuvant hormonal therapy with tamoxifen 20 mg daily from October 2015 to May 2020, but due to elevation of the liver transaminases was switched to anastrazole.

## 2022-04-04 NOTE — Progress Notes (Signed)
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 She was on adjuvant hormonal therapy with tamoxifen 20 mg daily from October 2015 to May 2020, but due to elevation of the liver transaminases was switched to anastrazole.  Malignant neoplasm metastatic to bone [facility]) New bone metastases, November 2022, with multiple marrow replacing lesions within the proximal right femur within the ischium as well as the inferior rami. 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 Ki67 was 60%. PET imaging from December 19th revealed dominant finding of intensely hypermetabolic aggressive skeletal metastasis involving the calvarium, sternum, thoracic and lumbar spine, and pelvis, with potential pathologic fracture of the right femur with internal fixation. There is soft tissue extension into the anterior mediastinum associated with the manubrial metastasis. Multiple spinal vertebral body lesions have cortical destruction along the posterior margin. She started monthly Xgeva in December, and recently completed radiation therapy. She was receiving THP and will plan for hormonal therapy.  Cycle 4 was delayed and dose reduction made by 20% due to left leg infection. This is being wrapped daily under the care of the wound clinic with Dr. Coralie Keens. She continues to have leg wrapped  daily and infection was found to be staph. Her heel lesion is almost completely closed and she should be discharged as early as next week from the wound clinic. She will continue with Herceptin/ Perjeta, but we  canceled the chemotherapy. She is now undergoing PT for her neck and back pain and states this is helping. She will proceed with HER2 treatment this week, cycle 9.   The patient understands the plans discussed today and is in agreement with them.  She knows to contact our office if she develops concerns prior to her next appointment.     Melodye Ped, NP  [place] [address] [place] [address] Dept: [phone] Dept Fax: [phone]   Orders Placed This Encounter  Procedures   CBC and differential    This external order was created through the Results Console.   CBC    This external order was created through the Results Console.   Basic metabolic panel    This external order was created through the Results Console.   Comprehensive metabolic panel    This external order was created through the Results Console.   Hepatic function panel    This external order was created through the Results Console.   Magnesium    This order was created through External Result Entry      CHIEF COMPLAINT:  CC: A 51 year old female with history of breast cancer here for 3 week evaluation  Current Treatment:  Trastuzumab/ Pertuzumab  INTERVAL HISTORY:  Anna [name] is here today for repeat clinical assessment. She denies fevers or chills. She denies pain. Her appetite is good. Her weight has been stable.  I have reviewed the past medical history,  past surgical history, social history and family history with the patient and they are unchanged from previous note.  ALLERGIES:  has No Known Allergies.  MEDICATIONS:  Current Outpatient Medications  Medication Sig Dispense Refill   albuterol (VENTOLIN HFA) 108 (90  Base) MCG/ACT inhaler Inhale 2 puffs into the lungs every 6 (six) hours as needed for shortness of breath.     ARIPiprazole (ABILIFY) 5 MG tablet Take 2.5 mg by mouth daily.     aspirin 81 MG EC tablet Take 81 mg by mouth daily. Swallow whole.     atorvastatin (LIPITOR) 20 MG tablet Take 1 tablet (20 mg total) by mouth daily. 90 tablet 3   augmented betamethasone dipropionate (DIPROLENE-AF) 0.05 % cream betamethasone, augmented 0.05 % topical cream     calcium carbonate (OS-CAL) 1250 (500 Ca) MG chewable tablet Chew 6 tablets by mouth daily.     CLENPIQ 10-3.5-12 MG-GM -GM/160ML SOLN      desvenlafaxine (PRISTIQ) 50 MG 24 hr tablet Take 50 mg by mouth daily.     diphenoxylate-atropine (LOMOTIL) 2.5-0.025 MG tablet TAKE TWO TABLETS BY MOUTH FOUR TIMES A DAY AS NEEDED FOR DIARRHEA OR LOOSE STOOLS 100 tablet 1   FARXIGA 10 MG TABS tablet Take 1 tablet (10 mg total) by mouth daily. 30 tablet 11   furosemide (LASIX) 20 MG tablet Take 1 tablet (20 mg total) by mouth daily. 30 tablet 0   labetalol (NORMODYNE) 200 MG tablet Take 1 tablet (200 mg total) by mouth 2 (two) times daily. 180 tablet 3   Loratadine 10 MG CAPS Take 1 capsule (10 mg total) by mouth daily. 30 capsule 6   LORazepam (ATIVAN) 1 MG tablet Take 1 tablet (1 mg total) by mouth every 8 (eight) hours. 30 tablet 0   magic mouthwash (lidocaine, diphenhydrAMINE, alum & mag hydroxide) suspension Take 5 mLs by mouth every 3 (three) hours as needed. Swish and spit; for throat/mouth pain     methocarbamol (ROBAXIN) 500 MG tablet methocarbamol 500 mg tablet  1 po q 6 hrs prn spasms     nitroGLYCERIN (NITROSTAT) 0.4 MG SL tablet Place 0.4 mg under the tongue every 5 (five) minutes as needed for chest pain. (Patient not taking: Reported on 11/23/2021)     omeprazole (PRILOSEC) 40 MG capsule      ondansetron (ZOFRAN) 4 MG tablet Take 1 tablet (4 mg total) by mouth every 4 (four) hours as needed for nausea. 90 tablet 3   Oxycodone HCl 10 MG TABS Take 1  tablet (10 mg total) by mouth every 4 (four) hours as needed. 120 tablet 0   OZEMPIC, 0.25 OR 0.5 MG/DOSE, 2 MG/3ML SOPN Inject into the skin.     potassium chloride SA (KLOR-CON M) 20 MEQ tablet Take 1 tablet (20 mEq total) by mouth 2 (two) times daily. 60 tablet 0   prochlorperazine (COMPAZINE) 10 MG tablet Take 1 tablet (10 mg total) by mouth every 6 (six) hours as needed for nausea or vomiting. 90 tablet 3   topiramate (TOPAMAX) 50 MG tablet Take 50 mg by mouth daily.     traMADol (ULTRAM) 50 MG tablet      valsartan (DIOVAN) 40 MG tablet Take 1 tablet (40 mg total) by mouth daily. 90 tablet 3   Vitamin D, Ergocalciferol, (DRISDOL) 1.25 MG (50000 UNIT) CAPS capsule Take 50,000 Units by mouth once a week.     Current Facility-Administered Medications  Medication Dose Route Frequency Provider Last Rate Last Admin  0.9 %  sodium chloride infusion  500 mL Intravenous Once Jackquline Denmark, MD       technetium sestamibi generic (CARDIOLITE) injection 22.4 millicurie  82.5 millicurie Intravenous Once PRN Revankar, Reita Cliche, MD        HISTORY OF PRESENT ILLNESS:   Oncology History  Malignant neoplasm of upper-outer quadrant of left female breast ([place])  07/23/2013 Initial Diagnosis   Malignant neoplasm of upper-outer quadrant of left female breast ([place])   07/23/2013 Cancer Staging   Staging form: Breast, AJCC 7th Edition - Clinical stage from 07/23/2013: Stage IIB (T2, N1, M0) - Signed by Derwood Kaplan, MD on 07/04/2020 Prognostic indicators: Pos LVI   09/22/2021 -  Chemotherapy   Patient is on Treatment Plan : BREAST  Trastuzumab + Pertuzumab q21d      Malignant neoplasm metastatic to bone ([place])  06/28/2021 Initial Diagnosis   Bone metastases ([place])   09/22/2021 -  Chemotherapy   Patient is on Treatment Plan : BREAST  Trastuzumab + Pertuzumab q21d          REVIEW OF SYSTEMS:   Constitutional: Denies fevers, chills or abnormal weight loss Eyes: Denies blurriness of  vision Ears, nose, mouth, throat, and face: Denies mucositis or sore throat Respiratory: Denies cough, dyspnea or wheezes Cardiovascular: Denies palpitation, chest discomfort or lower extremity swelling Gastrointestinal:  Denies nausea, heartburn or change in bowel habits Skin: Denies abnormal skin rashes Lymphatics: Denies new lymphadenopathy or easy bruising Neurological:Denies numbness, tingling or new weaknesses Behavioral/Psych: Mood is stable, no new changes  All other systems were reviewed with the patient and are negative.   VITALS:  Blood pressure 108/67, pulse 88, temperature 98.7 F (37.1 C), temperature source Oral, resp. rate 18, height 4' 11"  (1.499 m), weight 136 lb 11.2 oz (62 kg), last menstrual period 08/18/2013, SpO2 96 %.  Wt Readings from Last 3 Encounters:  04/04/22 136 lb 11.2 oz (62 kg)  03/16/22 140 lb (63.5 kg)  03/14/22 138 lb 8 oz (62.8 kg)    Body mass index is 27.61 kg/m.  Performance status (ECOG): 1 - Symptomatic but completely ambulatory  PHYSICAL EXAM:   GENERAL:alert, no distress and comfortable SKIN: skin color, texture, turgor are normal, no rashes or significant lesions EYES: normal, Conjunctiva are pink and non-injected, sclera clear OROPHARYNX:no exudate, no erythema and lips, buccal mucosa, and tongue normal  NECK: supple, thyroid normal size, non-tender, without nodularity LYMPH:  no palpable lymphadenopathy in the cervical, axillary or inguinal LUNGS: clear to auscultation and percussion with normal breathing effort HEART: regular rate & rhythm and no murmurs and no lower extremity edema ABDOMEN:abdomen soft, non-tender and normal bowel sounds Musculoskeletal:no cyanosis of digits and no clubbing  NEURO: alert & oriented x 3 with fluent speech, no focal motor/sensory deficits  LABORATORY DATA:  I have reviewed the data as listed    Component Value Date/Time   NA 141 04/04/2022 0000   K 3.7 04/04/2022 0000   CL 106 04/04/2022 0000    CO2 29 (A) 04/04/2022 0000   GLUCOSE 147 (H) 07/05/2021 0330   BUN 14 04/04/2022 0000   CREATININE 0.6 04/04/2022 0000   CREATININE 0.71 07/05/2021 0330   CALCIUM 9.4 04/04/2022 0000   PROT 7.7 06/30/2021 0833   ALBUMIN 4.0 04/04/2022 0000   AST 23 04/04/2022 0000   ALT 16 04/04/2022 0000   ALKPHOS 89 04/04/2022 0000   BILITOT 0.7 06/30/2021 0833   GFRNONAA >60 07/05/2021 0330    No results found for: "SPEP", "  UPEP"  Lab Results  Component Value Date   WBC 6.1 04/04/2022   NEUTROABS 4.15 04/04/2022   HGB 11.7 (A) 04/04/2022   HCT 38 04/04/2022   MCV 79 (A) 09/06/2021   PLT 237 04/04/2022      Chemistry      Component Value Date/Time   NA 141 04/04/2022 0000   K 3.7 04/04/2022 0000   CL 106 04/04/2022 0000   CO2 29 (A) 04/04/2022 0000   BUN 14 04/04/2022 0000   CREATININE 0.6 04/04/2022 0000   CREATININE 0.71 07/05/2021 0330   GLU 81 04/04/2022 0000      Component Value Date/Time   CALCIUM 9.4 04/04/2022 0000   ALKPHOS 89 04/04/2022 0000   AST 23 04/04/2022 0000   ALT 16 04/04/2022 0000   BILITOT 0.7 06/30/2021 5176       RADIOGRAPHIC STUDIES: I have personally reviewed the radiological images as listed and agreed with the findings in the report. ECHOCARDIOGRAM COMPLETE  Result Date: 03/07/2022    ECHOCARDIOGRAM REPORT   Patient Name:   Anna [name] [facility] Date of Exam: 03/07/2022 Medical Rec #:  [id number]           Height:       59.0 in Accession #:    [id number]          Weight:       141.0 lb Date of Birth:  1971/02/22          BSA:          1.590 m Patient Age:    10 years            BP:           99/69 mmHg Patient Gender: F                   HR:           96 bpm. Exam Location:  [place] Procedure: 2D Echo, Cardiac Doppler and Color Doppler Indications:    Malignant neoplasm metastatic to bone (H[facility]) [C79.51 (ICD-10-CM)]  History:        Patient has prior history of Echocardiogram examinations, most                 recent 11/20/2021. Risk  Factors:Hypertension and Dyslipidemia.  Sonographer:    Philipp Deputy RDCS Referring Phys: [place] Comments: Suboptimal subcostal window and suboptimal apical window. Image acquisition challenging due to uncooperative patient. Patient refused the use of Definity. IMPRESSIONS  1. TDS, unable to perform GLS. Left ventricular ejection fraction, by estimation, is 55 to 60%. The left ventricle has normal function. The left ventricle has no regional wall motion abnormalities. Left ventricular diastolic parameters were normal.  2. Right ventricular systolic function is normal. The right ventricular size is normal.  3. The mitral valve is normal in structure. No evidence of mitral valve regurgitation. No evidence of mitral stenosis.  4. The aortic valve is normal in structure. Aortic valve regurgitation is not visualized. No aortic stenosis is present.  5. The inferior vena cava is normal in size with greater than 50% respiratory variability, suggesting right atrial pressure of 3 mmHg. FINDINGS  Left Ventricle: TDS, unable to perform GLS. Left ventricular ejection fraction, by estimation, is 55 to 60%. The left ventricle has normal function. The left ventricle has no regional wall motion abnormalities. The left ventricular internal cavity size was normal in size. There is no left ventricular hypertrophy. Left ventricular diastolic parameters  were normal. Right Ventricle: The right ventricular size is normal. No increase in right ventricular wall thickness. Right ventricular systolic function is normal. Left Atrium: Left atrial size was normal in size. Right Atrium: Right atrial size was normal in size. Pericardium: There is no evidence of pericardial effusion. Mitral Valve: The mitral valve is normal in structure. No evidence of mitral valve regurgitation. No evidence of mitral valve stenosis. Tricuspid Valve: The tricuspid valve is normal in structure. Tricuspid valve regurgitation is not  demonstrated. No evidence of tricuspid stenosis. Aortic Valve: The aortic valve is normal in structure. Aortic valve regurgitation is not visualized. No aortic stenosis is present. Pulmonic Valve: The pulmonic valve was normal in structure. Pulmonic valve regurgitation is not visualized. No evidence of pulmonic stenosis. Aorta: The aortic root is normal in size and structure. Venous: The inferior vena cava is normal in size with greater than 50% respiratory variability, suggesting right atrial pressure of 3 mmHg. IAS/Shunts: No atrial level shunt detected by color flow Doppler.  LEFT VENTRICLE PLAX 2D LVIDd:         3.80 cm   Diastology LVIDs:         3.00 cm   LV e' medial:    9.79 cm/s LV PW:         0.90 cm   LV E/e' medial:  7.6 LV IVS:        0.80 cm   LV e' lateral:   13.10 cm/s LVOT diam:     1.80 cm   LV E/e' lateral: 5.7 LV SV:         41 LV SV Index:   26 LVOT Area:     2.54 cm  LEFT ATRIUM           Index LA diam:      2.30 cm 1.45 cm/m LA Vol (A4C): 29.0 ml 18.24 ml/m  AORTIC VALVE LVOT Vmax:   79.60 cm/s LVOT Vmean:  52.600 cm/s LVOT VTI:    0.160 m  AORTA Ao Root diam: 3.10 cm Ao Asc diam:  2.40 cm MITRAL VALVE MV Area (PHT): 6.54 cm    SHUNTS MV Decel Time: 116 msec    Systemic VTI:  0.16 m MV E velocity: 74.60 cm/s  Systemic Diam: 1.80 cm MV A velocity: 61.30 cm/s MV E/A ratio:  1.22 Jenne Campus MD Electronically signed by Jenne Campus MD Signature Date/Time: 03/07/2022/12:33:07 PM    Final

## 2022-04-05 ENCOUNTER — Other Ambulatory Visit: Payer: Self-pay

## 2022-04-05 MED FILL — Trastuzumab-dkst For IV Soln 150 MG: INTRAVENOUS | Qty: 19 | Status: AC

## 2022-04-06 ENCOUNTER — Inpatient Hospital Stay: Payer: BC Managed Care – PPO

## 2022-04-06 VITALS — BP 109/72 | HR 88 | Temp 98.3°F | Resp 18 | Ht 59.0 in | Wt 140.0 lb

## 2022-04-06 DIAGNOSIS — Z170 Estrogen receptor positive status [ER+]: Secondary | ICD-10-CM

## 2022-04-06 DIAGNOSIS — C7951 Secondary malignant neoplasm of bone: Secondary | ICD-10-CM

## 2022-04-06 DIAGNOSIS — Z5111 Encounter for antineoplastic chemotherapy: Secondary | ICD-10-CM | POA: Diagnosis not present

## 2022-04-06 MED ORDER — SODIUM CHLORIDE 0.9% FLUSH
10.0000 mL | INTRAVENOUS | Status: DC | PRN
  Administered 2022-04-06: 10 mL

## 2022-04-06 MED ORDER — TRASTUZUMAB-DKST CHEMO 150 MG IV SOLR
6.0000 mg/kg | Freq: Once | INTRAVENOUS | Status: AC
  Administered 2022-04-06: 399 mg via INTRAVENOUS
  Filled 2022-04-06: qty 19

## 2022-04-06 MED ORDER — SODIUM CHLORIDE 0.9 % IV SOLN: Freq: Once | INTRAVENOUS | Status: AC

## 2022-04-06 MED ORDER — SODIUM CHLORIDE 0.9 % IV SOLN
420.0000 mg | Freq: Once | INTRAVENOUS | Status: AC
  Administered 2022-04-06: 420 mg via INTRAVENOUS
  Filled 2022-04-06: qty 14

## 2022-04-06 MED ORDER — ACETAMINOPHEN 325 MG PO TABS
650.0000 mg | ORAL_TABLET | Freq: Once | ORAL | Status: AC
  Administered 2022-04-06: 650 mg via ORAL
  Filled 2022-04-06: qty 2

## 2022-04-06 MED ORDER — DENOSUMAB 120 MG/1.7ML ~~LOC~~ SOLN
120.0000 mg | Freq: Once | SUBCUTANEOUS | Status: AC
  Administered 2022-04-06: 120 mg via SUBCUTANEOUS

## 2022-04-06 MED ORDER — DIPHENHYDRAMINE HCL 25 MG PO CAPS
50.0000 mg | ORAL_CAPSULE | Freq: Once | ORAL | Status: AC
  Administered 2022-04-06: 50 mg via ORAL
  Filled 2022-04-06: qty 2

## 2022-04-06 MED ORDER — HEPARIN SOD (PORK) LOCK FLUSH 100 UNIT/ML IV SOLN
500.0000 [IU] | Freq: Once | INTRAVENOUS | Status: AC | PRN
  Administered 2022-04-06: 500 [IU]

## 2022-04-06 NOTE — Patient Instructions (Signed)
Pertuzumab Injection What is this medication? PERTUZUMAB (per TOOZ ue mab) treats breast cancer. It works by blocking a protein that causes cancer cells to grow and multiply. This helps to slow or stop the spread of cancer cells. It is a monoclonal antibody. This medicine may be used for other purposes; ask your health care provider or pharmacist if you have questions. COMMON BRAND NAME(S): PERJETA What should I tell my care team before I take this medication? They need to know if you have any of these conditions: Heart failure An unusual or allergic reaction to pertuzumab, other medications, foods, dyes, or preservatives Pregnant or trying to get pregnant Breast-feeding How should I use this medication? This medication is injected into a vein. It is given by your care team in a hospital or clinic setting. Talk to your care team about the use of this medication in children. Special care may be needed. Overdosage: If you think you have taken too much of this medicine contact a poison control center or emergency room at once. NOTE: This medicine is only for you. Do not share this medicine with others. What if I miss a dose? Keep appointments for follow-up doses. It is important not to miss your dose. Call your care team if you are unable to keep an appointment. What may interact with this medication? Interactions are not expected. This list may not describe all possible interactions. Give your health care provider a list of all the medicines, herbs, non-prescription drugs, or dietary supplements you use. Also tell them if you smoke, drink alcohol, or use illegal drugs. Some items may interact with your medicine. What should I watch for while using this medication? Your condition will be monitored carefully while you are receiving this medication. This medication may make you feel generally unwell. This is not uncommon as chemotherapy can affect healthy cells as well as cancer cells. Report any  side effects. Continue your course of treatment even though you feel ill unless your care team tells you to stop. Talk to your care team if you may be pregnant. Serious birth defects can occur if you take this medication during pregnancy and for 7 months after the last dose. You will need a negative pregnancy test before starting this medication. Contraception is recommended while taking this medication and for 7 months after the last dose. Your care team can help you find the option that works for you. Do not breastfeed while taking this medication and for 7 months after the last dose. What side effects may I notice from receiving this medication? Side effects that you should report to your care team as soon as possible: Allergic reactions or angioedema--skin rash, itching or hives, swelling of the face, eyes, lips, tongue, arms, or legs, trouble swallowing or breathing Heart failure--shortness of breath, swelling of the ankles, feet, or hands, sudden weight gain, unusual weakness or fatigue Infusion reactions--chest pain, shortness of breath or trouble breathing, feeling faint or lightheaded Side effects that usually do not require medical attention (report to your care team if they continue or are bothersome): Diarrhea Dry skin Fatigue Hair loss Nausea Vomiting This list may not describe all possible side effects. Call your doctor for medical advice about side effects. You may report side effects to FDA at 1-800-FDA-1088. Where should I keep my medication? This medication is given in a hospital or clinic. It will not be stored at home. NOTE: This sheet is a summary. It may not cover all possible information. If you have  questions about this medicine, talk to your doctor, pharmacist, or health care provider.  2023 Elsevier/Gold Standard (2021-11-30 00:00:00) Trastuzumab Injection What is this medication? TRASTUZUMAB (tras TOO zoo mab) treats breast cancer and stomach cancer. It works by  blocking a protein that causes cancer cells to grow and multiply. This helps to slow or stop the spread of cancer cells. This medicine may be used for other purposes; ask your health care provider or pharmacist if you have questions. COMMON BRAND NAME(S): Herceptin, Janae Bridgeman, Ontruzant, Trazimera What should I tell my care team before I take this medication? They need to know if you have any of these conditions: Heart failure Lung disease An unusual or allergic reaction to trastuzumab, other medications, foods, dyes, or preservatives Pregnant or trying to get pregnant Breast-feeding How should I use this medication? This medication is injected into a vein. It is given by your care team in a hospital or clinic setting. Talk to your care team about the use of this medication in children. It is not approved for use in children. Overdosage: If you think you have taken too much of this medicine contact a poison control center or emergency room at once. NOTE: This medicine is only for you. Do not share this medicine with others. What if I miss a dose? Keep appointments for follow-up doses. It is important not to miss your dose. Call your care team if you are unable to keep an appointment. What may interact with this medication? Certain types of chemotherapy, such as daunorubicin, doxorubicin, epirubicin, idarubicin This list may not describe all possible interactions. Give your health care provider a list of all the medicines, herbs, non-prescription drugs, or dietary supplements you use. Also tell them if you smoke, drink alcohol, or use illegal drugs. Some items may interact with your medicine. What should I watch for while using this medication? Your condition will be monitored carefully while you are receiving this medication. This medication may make you feel generally unwell. This is not uncommon, as chemotherapy affects healthy cells as well as cancer cells. Report any side  effects. Continue your course of treatment even though you feel ill unless your care team tells you to stop. This medication may increase your risk of getting an infection. Call your care team for advice if you get a fever, chills, sore throat, or other symptoms of a cold or flu. Do not treat yourself. Try to avoid being around people who are sick. Avoid taking medications that contain aspirin, acetaminophen, ibuprofen, naproxen, or ketoprofen unless instructed by your care team. These medications can hide a fever. Talk to your care team if you may be pregnant. Serious birth defects can occur if you take this medication during pregnancy and for 7 months after the last dose. You will need a negative pregnancy test before starting this medication. Contraception is recommended while taking this medication and for 7 months after the last dose. Your care team can help you find the option that works for you. Do not breastfeed while taking this medication and for 7 months after stopping treatment. What side effects may I notice from receiving this medication? Side effects that you should report to your care team as soon as possible: Allergic reactions or angioedema--skin rash, itching or hives, swelling of the face, eyes, lips, tongue, arms, or legs, trouble swallowing or breathing Dry cough, shortness of breath or trouble breathing Heart failure--shortness of breath, swelling of the ankles, feet, or hands, sudden weight gain, unusual weakness  or fatigue Infection--fever, chills, cough, or sore throat Infusion reactions--chest pain, shortness of breath or trouble breathing, feeling faint or lightheaded Side effects that usually do not require medical attention (report to your care team if they continue or are bothersome): Diarrhea Dizziness Headache Nausea Trouble sleeping Vomiting This list may not describe all possible side effects. Call your doctor for medical advice about side effects. You may report  side effects to FDA at 1-800-FDA-1088. Where should I keep my medication? This medication is given in a hospital or clinic. It will not be stored at home. NOTE: This sheet is a summary. It may not cover all possible information. If you have questions about this medicine, talk to your doctor, pharmacist, or health care provider.  2023 Elsevier/Gold Standard (2021-12-12 00:00:00)

## 2022-04-16 ENCOUNTER — Other Ambulatory Visit: Payer: Self-pay | Admitting: Pharmacist

## 2022-04-16 DIAGNOSIS — C7951 Secondary malignant neoplasm of bone: Secondary | ICD-10-CM

## 2022-04-16 DIAGNOSIS — Z170 Estrogen receptor positive status [ER+]: Secondary | ICD-10-CM

## 2022-04-24 ENCOUNTER — Inpatient Hospital Stay (INDEPENDENT_AMBULATORY_CARE_PROVIDER_SITE_OTHER): Payer: BC Managed Care – PPO | Admitting: Hematology and Oncology

## 2022-04-24 ENCOUNTER — Encounter: Payer: Self-pay | Admitting: Hematology and Oncology

## 2022-04-24 ENCOUNTER — Other Ambulatory Visit: Payer: Self-pay

## 2022-04-24 ENCOUNTER — Inpatient Hospital Stay: Payer: BC Managed Care – PPO | Attending: Hematology and Oncology

## 2022-04-24 VITALS — BP 111/76 | HR 90 | Temp 97.6°F | Resp 18 | Ht 59.0 in | Wt 134.0 lb

## 2022-04-24 DIAGNOSIS — Z5111 Encounter for antineoplastic chemotherapy: Secondary | ICD-10-CM | POA: Insufficient documentation

## 2022-04-24 DIAGNOSIS — D509 Iron deficiency anemia, unspecified: Secondary | ICD-10-CM

## 2022-04-24 DIAGNOSIS — Z170 Estrogen receptor positive status [ER+]: Secondary | ICD-10-CM | POA: Diagnosis not present

## 2022-04-24 DIAGNOSIS — C7951 Secondary malignant neoplasm of bone: Secondary | ICD-10-CM | POA: Diagnosis not present

## 2022-04-24 DIAGNOSIS — C50412 Malignant neoplasm of upper-outer quadrant of left female breast: Secondary | ICD-10-CM

## 2022-04-24 DIAGNOSIS — Z923 Personal history of irradiation: Secondary | ICD-10-CM | POA: Insufficient documentation

## 2022-04-24 DIAGNOSIS — Z853 Personal history of malignant neoplasm of breast: Secondary | ICD-10-CM | POA: Diagnosis not present

## 2022-04-24 DIAGNOSIS — Z5112 Encounter for antineoplastic immunotherapy: Secondary | ICD-10-CM | POA: Diagnosis present

## 2022-04-24 LAB — CBC AND DIFFERENTIAL
HCT: 37 (ref 36–46)
Hemoglobin: 11.6 — AB (ref 12.0–16.0)
Neutrophils Absolute: 3.62
Platelets: 279 10*3/uL (ref 150–400)
WBC: 5.4

## 2022-04-24 LAB — BASIC METABOLIC PANEL
BUN: 15 (ref 4–21)
CO2: 31 — AB (ref 13–22)
Chloride: 105 (ref 99–108)
Creatinine: 0.6 (ref 0.5–1.1)
Glucose: 97
Potassium: 3.7 mEq/L (ref 3.5–5.1)
Sodium: 140 (ref 137–147)

## 2022-04-24 LAB — HEPATIC FUNCTION PANEL
ALT: 20 U/L (ref 7–35)
AST: 69 — AB (ref 13–35)
Alkaline Phosphatase: 109 (ref 25–125)
Bilirubin, Total: 0.5

## 2022-04-24 LAB — IRON AND TIBC
Iron: 25 ug/dL — ABNORMAL LOW (ref 28–170)
Saturation Ratios: 9 % — ABNORMAL LOW (ref 10.4–31.8)
TIBC: 287 ug/dL (ref 250–450)
UIBC: 262 ug/dL

## 2022-04-24 LAB — FERRITIN: Ferritin: 119 ng/mL (ref 11–307)

## 2022-04-24 LAB — COMPREHENSIVE METABOLIC PANEL
Albumin: 4 (ref 3.5–5.0)
Calcium: 9 (ref 8.7–10.7)

## 2022-04-24 LAB — CBC
MCV: 78 — AB (ref 81–99)
RBC: 4.71 (ref 3.87–5.11)

## 2022-04-24 LAB — MAGNESIUM: Magnesium: 2.2 (ref 1.6–2.3)

## 2022-04-24 NOTE — Progress Notes (Signed)
Face to face contact with pt and husband in lobby. Pt is no longer on chemo and reports that she is feeling much better. Pt continues on Herceptin /Perjeta maintenance therapy. Pt is here for follow up today.

## 2022-04-24 NOTE — Progress Notes (Signed)
[place]  [address] [place],  [place]  [address] [phone]  Clinic Day:  04/24/2022  Referring physician: Bonnita Nasuti, MD  ASSESSMENT & PLAN:   Assessment & Plan: Malignant neoplasm metastatic to bone [facility]) Bone metastases secondary to breast cancer diagnosed in November 2022.  There multiple marrow replacing lesions within the proximal right femur within the ischium, as well as the inferior rami. She underwent total right hip replacement in late November due to impending fracture.  Pathology revealed metastatic carcinoma consistent with her primary breast cancer.  Estrogen receptors were positive at 95% and progesterone receptors positive at 40%. 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 Ki67 was 60%.   PET scan in December 2022 revealed dominant finding of intensely hypermetabolic aggressive skeletal metastasis involving the calvarium, sternum, thoracic and lumbar spine, and pelvis, with potential pathologic fracture of the right femur with internal fixation. There is soft tissue extension into the anterior mediastinum associated with the manubrial metastasis. Multiple spinal vertebral body lesions have cortical destruction along the posterior margin.  The patient was placed on monthly denosumab in December for the bone metastasis.  She received palliative radiation therapy to the right hip, left sacrum/SI joint and upper thoracic spine/sternum completed in February.  She was then given docetaxel/trastuzumab/pertuzumab (THP) for 5 cycles.  Unfortunately, she had a staphylococcal left leg infection, so docetaxel was discontinued in May.  She continues trastuzumab/pertuzumab and will proceed with 10th cycle this week.  She has not yet been started on hormonal therapy.   She reports worsening left shoulder pain. There is tenderness in the left acromioclavicular joint.  I will obtain an x-ray for further evaluation.  I can  have her see Dr. Orlene Erm for consideration of palliative radiation if indicated.  If this is not secondary to her malignancy, we could have her see orthopedic surgeon for consideration of local corticosteroid injection  She is taking oxycodone 10 mg every 4 hours around-the-clock.  She tried MS Contin previously and felt she had difficulty breathing.  I will try her on extended release oxycodone 20 mg every 12 hours.  There is new elevation of the AST, which could be medication effects.  But because of her widespread bone metastasis, I would recommend obtaining CT chest, abdomen and pelvis to reassess her disease baseline as her last imaging was a PET in December 2022.  She will proceed with 10th cycle of trastuzumab/pertuzumab this week.  We will plan to see her back in 3 weeks for repeat clinical assessment prior to and 11th cycle.  Malignant neoplasm of upper-outer quadrant of left female breast [facility]) History of stage IIB hormone receptor positive breast cancer diagnosed in December 2014.  She was treated with surgery, chemotherapy and radiation therapy. She was on adjuvant hormonal therapy with tamoxifen 20 mg daily from October 2015 to May 2020, but due to elevation of the liver transaminases was switched to anastrazole in June 2020.   Microcytic anemia Mild microcytic anemia.  She has had some bright red blood per rectum, which she attributes to hemorrhoids.  I will add iron studies today to evaluate.   The patient understand and her husband the plans discussed today and are in agreement with them.  She knows to contact our office if she develops concerns prior to her next appointment.   I provided 35 minutes of face-to-face time during this encounter and > 50% was spent counseling as documented under my  assessment and plan.     A , PA-C  [facility] [address] [place] [address] Dept: [phone] Dept Fax:  [phone]   Orders Placed This Encounter  Procedures   DG Shoulder Left    Standing Status:   Future    Standing Expiration Date:   04/25/2023    Scheduling Instructions:     RH    Order Specific Question:   Reason for Exam (SYMPTOM  OR DIAGNOSIS REQUIRED)    Answer:   increased pain left shoulder/arm    Order Specific Question:   Is patient pregnant?    Answer:   No    Order Specific Question:   Preferred imaging location?    Answer:   External   CT CHEST ABDOMEN PELVIS W CONTRAST    Standing Status:   Future    Standing Expiration Date:   04/25/2023    Scheduling Instructions:     RH    Order Specific Question:   Preferred imaging location?    Answer:   External    Comments:   RH    Order Specific Question:   Radiology Contrast Protocol - do NOT remove file path    Answer:   [email or web address]    Order Specific Question:   Is patient pregnant?    Answer:   No   CBC and differential    This external order was created through the Results Console.   CBC    This external order was created through the Results Console.   Basic metabolic panel    This external order was created through the Results Console.   Comprehensive metabolic panel    This external order was created through the Results Console.   Hepatic function panel    This external order was created through the Results Console.   CBC    This order was created through External Result Entry   Magnesium    This order was created through External Result Entry   Ferritin    Standing Status:   Future    Number of Occurrences:   1    Standing Expiration Date:   04/25/2023   Iron and TIBC    Standing Status:   Future    Number of Occurrences:   1    Standing Expiration Date:   04/25/2023   Ambulatory Referral to [facility]    Referral Priority:   Routine    Referral Type:   Consultation    Referral Reason:   Specialty Services Required    Number of Visits Requested:   1       CHIEF COMPLAINT:  CC: Recurrent breast cancer with bone metastasis, hormone and HER2/neu receptor positive  Current Treatment: Trastuzumab/pertuzumab every 3 weeks  HISTORY OF PRESENT ILLNESS:   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 (HCC)   07/23/2013 Cancer Staging   Staging form: Breast, AJCC 7th Edition - Clinical stage from 07/23/2013: Stage IIB (T2, N1, M0) - Signed by McCarty, Christine H, MD on 07/04/2020 Prognostic indicators: Pos LVI   09/22/2021 - 04/06/2022 Chemotherapy   Patient is on Treatment Plan : BREAST  Trastuzumab + Pertuzumab q21d      09/22/2021 -  Chemotherapy   Patient is on Treatment Plan : BREAST DOCEtaxel + Trastuzumab + Pertuzumab (THP) q21d x 8 cycles /   Trastuzumab + Pertuzumab q21d x 4 cycles     Malignant neoplasm metastatic to bone (HCC)  06/28/2021 Initial Diagnosis   Bone metastases (HCC)   09/22/2021 - 04/06/2022 Chemotherapy   Patient is on Treatment Plan : BREAST  Trastuzumab + Pertuzumab q21d      09/22/2021 -  Chemotherapy   Patient is on Treatment Plan : BREAST DOCEtaxel + Trastuzumab + Pertuzumab (THP) q21d x 8 cycles / Trastuzumab + Pertuzumab q21d x 4 cycles         INTERVAL HISTORY:  Erdine is here today for repeat clinical assessment prior to 10th cycle of trastuzumab/pertuzumab.  She reports fatigue.  She reports increased pain in the left shoulder and arm with stable pain in her back.  She is taking oxycodone 10 mg every 4 hours around-the-clock.  She has fairly regular nausea without vomiting for which medication is effective.  She reports occasional bright red blood per rectum with hard stool, which she attributes to hemorrhoids.  She denies fevers or chills.  Her appetite is decreased. Her weight has decreased 6 pounds over last 3 weeks .  Her leg wound has healed and she is no longer  seeing Dr. Moorhead.  She continues to regularly. To clean the area REVIEW OF SYSTEMS:  Review of Systems  Constitutional:  Positive for appetite change, fatigue and unexpected weight change. Negative for chills and fever.  HENT:   Negative for lump/mass, mouth sores and sore throat.   Respiratory:  Negative for cough and shortness of breath.   Cardiovascular:  Negative for chest pain and leg swelling.  Gastrointestinal:  Positive for blood in stool. Negative for abdominal pain, constipation, diarrhea, nausea and vomiting.  Endocrine: Negative for hot flashes.  Genitourinary:  Negative for difficulty urinating, dysuria, frequency and hematuria.   Musculoskeletal:  Positive for arthralgias (Left shoulder) and back pain. Negative for myalgias.  Skin:  Negative for rash.  Neurological:  Negative for dizziness and headaches.  Hematological:  Negative for adenopathy. Does not bruise/bleed easily.  Psychiatric/Behavioral:  Negative for depression and sleep disturbance. The patient is not nervous/anxious.      VITALS:  Blood pressure 111/76, pulse 90, temperature 97.6 F (36.4 C), temperature source Oral, resp. rate 18, height 4' 11" (1.499 m), weight 134 lb (60.8 kg), last menstrual period 08/18/2013, SpO2 95 %.  Wt Readings from Last 3 Encounters:  04/24/22 134 lb (60.8 kg)  04/06/22 140 lb (63.5 kg)  04/04/22 136 lb 11.2 oz (62 kg)    Body mass index is 27.06 kg/m.  Performance status (ECOG): 2 - Symptomatic, <50% confined to bed  PHYSICAL EXAM:  Physical Exam Vitals and nursing note reviewed.  Constitutional:      General: She is not in acute distress.    Appearance: Normal appearance. She is ill-appearing (Chronically ill-appearing).     Comments: She ambulates with a cane.  HENT:     Head: Normocephalic and atraumatic.     Mouth/Throat:     Mouth: Mucous membranes are moist.     Pharynx: Oropharynx is clear. No oropharyngeal exudate or posterior oropharyngeal erythema.   Eyes:     General: No scleral icterus.    Extraocular Movements: Extraocular movements intact.     Conjunctiva/sclera: Conjunctivae normal.     Pupils: Pupils are equal, round, and reactive to light.  Cardiovascular:     Rate and Rhythm: Normal rate and regular rhythm.     Heart sounds: Normal heart sounds. No murmur heard.      No friction rub. No gallop.  Pulmonary:     Effort: Pulmonary effort is normal.     Breath sounds: Normal breath sounds. No wheezing, rhonchi or rales.  Chest:     Comments: Breast exam is deferred Abdominal:     General: There is no distension.     Palpations: Abdomen is soft. There is no hepatomegaly, splenomegaly or mass.     Tenderness: There is no abdominal tenderness.  Musculoskeletal:        General: Normal range of motion.     Left shoulder: Tenderness (Acromioclavicular joint) present.     Cervical back: Normal range of motion and neck supple. No tenderness.     Right lower leg: No edema.     Left lower leg: No edema.  Lymphadenopathy:     Cervical: No cervical adenopathy.     Upper Body:     Right upper body: No supraclavicular or axillary adenopathy.     Left upper body: No supraclavicular or axillary adenopathy.  Skin:    General: Skin is warm and dry.     Coloration: Skin is not jaundiced.     Findings: No rash.  Neurological:     Mental Status: She is alert and oriented to person, place, and time.     Cranial Nerves: No cranial nerve deficit.  Psychiatric:        Mood and Affect: Mood normal.        Behavior: Behavior normal.        Thought Content: Thought content normal.     LABS:      Latest Ref Rng & Units 04/24/2022   12:00 AM 04/04/2022   12:00 AM 03/14/2022   12:00 AM  CBC  WBC  5.4     6.1     5.2      Hemoglobin 12.0 - 16.0 11.6     11.7     12.2      Hematocrit 36 - 46 37     38     39      Platelets 150 - 400 K/uL 279     237     213         This result is from an external source.      Latest Ref Rng & Units  04/24/2022   12:00 AM 04/04/2022   12:00 AM 03/14/2022   12:00 AM  CMP  BUN 4 - _0 Creatinine 0.5 - 1.1 0.6     0.6     0.7      Sodium 137 - 147 140     141     141      Potassium 3.5 - 5.1 mEq/L 3.7     3.7     3.7      Chloride 99 - 108 105     106     104      CO2 13 - _1 Calcium 8.7 - 10.7 9.0     9.4     9.0      Alkaline Phos 25 - 125 109     89     73      AST 13 - 35 69     23     27      ALT 7 -  35 U/L _0 This result is from an external source.     Lab Results  Component Value Date   CEA1 2.2 07/20/2021   /  CEA  Date Value Ref Range Status  07/20/2021 2.2 0.0 - 4.7 ng/mL Final    Comment:    (NOTE)                             Nonsmokers          <3.9                             Smokers             <5.6 [employer] Electrochemiluminescence Immunoassay (ECLIA) Values obtained with different assay methods or kits cannot be used interchangeably.  Results cannot be interpreted as absolute evidence of the presence or absence of malignant disease. Performed At: [facility] [place], [place] [id number] Rush Farmer MD FG:[id number]    No results found for: "PSA1" No results found for: "CAN199" No results found for: "CAN125"  No results found for: "TOTALPROTELP", "ALBUMINELP", "A1GS", "A2GS", "BETS", "BETA2SER", "GAMS", "MSPIKE", "SPEI" Lab Results  Component Value Date   TIBC 279 10/13/2021   FERRITIN 206 10/13/2021   IRONPCTSAT 13 10/13/2021   No results found for: "LDH"  STUDIES:  No results found.    HISTORY:   Past Medical History:  Diagnosis Date   Achilles tendinitis of left lower extremity 04/22/2018   Acute peptic ulcer without hemorrhage and without perforation 11/29/2020   Acute sinusitis 11/29/2020   Bipolar disorder ([place]) 11/29/2020   Breast cancer ([place])    Cancer ([place])    Cardiac murmur    Chest pain    Chronic fatigue syndrome 11/29/2020    Depression    Essential hypertension 11/29/2020   Hepatic steatosis determined by biopsy of liver 07/04/2020   HLD (hyperlipidemia)    Hypertension    Hypothyroidism 11/29/2020   Insomnia 11/29/2020   Malignant neoplasm of upper-outer quadrant of left female breast ([place]) 07/23/2013   Migraine 11/29/2020   Mild intermittent asthma 11/29/2020   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   Mixed hyperlipidemia 11/29/2020   Obesity due to excess calories    Osteopenia after menopause 07/04/2020   Other long term (current) drug therapy 11/29/2020   Other vitamin B12 deficiency anemias 11/29/2020   Personal history of malignant neoplasm of breast 11/29/2020   Pre-diabetes    Renal cyst, acquired, right 07/04/2020   14.5 cm in October 2021   Retrocalcaneal bursitis (back of heel), left 04/22/2018   Seasonal allergic rhinitis 11/29/2020   Tightness of heel cord, left 04/22/2018   Type 2 diabetes mellitus without complications ([place]) 6/78/9381   Vitamin D deficiency 11/29/2020    Past Surgical History:  Procedure Laterality Date   CESAREAN SECTION     LIVER BIOPSY  01/2019   lumpectomy  2014   Left Breast   PORT-A-CATH REMOVAL     PORTACATH PLACEMENT  08/28/2013   STOMACH SURGERY     Tummy Tuck   TOTAL HIP ARTHROPLASTY Right 07/04/2021   Procedure: TOTAL HIP ARTHROPLASTY;  Surgeon: Paralee Cancel, MD;  Location: [facility];  Service: Orthopedics;  Laterality: Right;  90   WISDOM TOOTH EXTRACTION      Family  History  Problem Relation Age of Onset   Hyperlipidemia Mother    Diabetes Father    Stroke Father    Colon cancer Neg Hx    Colon polyps Neg Hx    Esophageal cancer Neg Hx    Rectal cancer Neg Hx    Stomach cancer Neg Hx     Social History:  reports that she quit smoking about 19 years ago. Her smoking use included cigarettes. She has never used smokeless tobacco. She reports current alcohol use. She reports that she does not use drugs.The patient is accompanied by her husband  today.  Allergies: No Known Allergies  Current Medications: Current Outpatient Medications  Medication Sig Dispense Refill   albuterol (VENTOLIN HFA) 108 (90 Base) MCG/ACT inhaler Inhale 2 puffs into the lungs every 6 (six) hours as needed for shortness of breath.     ARIPiprazole (ABILIFY) 5 MG tablet Take 2.5 mg by mouth daily.     aspirin 81 MG EC tablet Take 81 mg by mouth daily. Swallow whole.     atorvastatin (LIPITOR) 20 MG tablet Take 1 tablet (20 mg total) by mouth daily. 90 tablet 3   augmented betamethasone dipropionate (DIPROLENE-AF) 0.05 % cream betamethasone, augmented 0.05 % topical cream     calcium carbonate (OS-CAL) 1250 (500 Ca) MG chewable tablet Chew 6 tablets by mouth daily.     CLENPIQ 10-3.5-12 MG-GM -GM/160ML SOLN      desvenlafaxine (PRISTIQ) 50 MG 24 hr tablet Take 50 mg by mouth daily.     diphenoxylate-atropine (LOMOTIL) 2.5-0.025 MG tablet TAKE TWO TABLETS BY MOUTH FOUR TIMES A DAY AS NEEDED FOR DIARRHEA OR LOOSE STOOLS 100 tablet 1   FARXIGA 10 MG TABS tablet Take 1 tablet (10 mg total) by mouth daily. 30 tablet 11   furosemide (LASIX) 20 MG tablet Take 1 tablet (20 mg total) by mouth daily. 30 tablet 0   labetalol (NORMODYNE) 200 MG tablet Take 1 tablet (200 mg total) by mouth 2 (two) times daily. 180 tablet 3   Loratadine 10 MG CAPS Take 1 capsule (10 mg total) by mouth daily. 30 capsule 6   LORazepam (ATIVAN) 1 MG tablet Take 1 tablet (1 mg total) by mouth every 8 (eight) hours. 30 tablet 0   magic mouthwash (lidocaine, diphenhydrAMINE, alum & mag hydroxide) suspension Take 5 mLs by mouth every 3 (three) hours as needed. Swish and spit; for throat/mouth pain     nitroGLYCERIN (NITROSTAT) 0.4 MG SL tablet Place 0.4 mg under the tongue every 5 (five) minutes as needed for chest pain. (Patient not taking: Reported on 11/23/2021)     omeprazole (PRILOSEC) 40 MG capsule      ondansetron (ZOFRAN) 4 MG tablet Take 1 tablet (4 mg total) by mouth every 4 (four) hours  as needed for nausea. 90 tablet 3   Oxycodone HCl 10 MG TABS Take 1 tablet (10 mg total) by mouth every 4 (four) hours as needed. 120 tablet 0   OZEMPIC, 0.25 OR 0.5 MG/DOSE, 2 MG/3ML SOPN Inject into the skin.     potassium chloride SA (KLOR-CON M) 20 MEQ tablet Take 1 tablet (20 mEq total) by mouth 2 (two) times daily. 60 tablet 0   prochlorperazine (COMPAZINE) 10 MG tablet Take 1 tablet (10 mg total) by mouth every 6 (six) hours as needed for nausea or vomiting. 90 tablet 3   topiramate (TOPAMAX) 50 MG tablet Take 50 mg by mouth daily.     traMADol (ULTRAM) 50 MG  tablet      valsartan (DIOVAN) 40 MG tablet Take 1 tablet (40 mg total) by mouth daily. 90 tablet 3   Vitamin D, Ergocalciferol, (DRISDOL) 1.25 MG (50000 UNIT) CAPS capsule Take 50,000 Units by mouth once a week.     Current Facility-Administered Medications  Medication Dose Route Frequency Provider Last Rate Last Admin   0.9 %  sodium chloride infusion  500 mL Intravenous Once Jackquline Denmark, MD       technetium sestamibi generic (CARDIOLITE) injection 48.1 millicurie  85.6 millicurie Intravenous Once PRN Revankar, Reita Cliche, MD

## 2022-04-24 NOTE — Assessment & Plan Note (Signed)
Mild microcytic anemia.  She has had some bright red blood per rectum, which she attributes to hemorrhoids.  I will add iron studies today to evaluate.

## 2022-04-24 NOTE — Assessment & Plan Note (Addendum)
Bone metastases secondary to breast cancer diagnosed in November 2022.  There multiple marrow replacing lesions within the proximal right femur within the ischium, as well as the inferior rami. She underwent total right hip replacement in late November due to impending fracture.  Pathology revealed metastatic carcinoma consistent with her primary breast cancer.  Estrogen receptors were positive at 95% and progesterone receptors positive at 40%. 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 Ki67 was 60%.   PET scan in December 2022 revealed dominant finding of intensely hypermetabolic aggressive skeletal metastasis involving the calvarium, sternum, thoracic and lumbar spine, and pelvis, with potential pathologic fracture of the right femur with internal fixation. There is soft tissue extension into the anterior mediastinum associated with the manubrial metastasis. Multiple spinal vertebral body lesions have cortical destruction along the posterior margin. The patient was placed on monthly denosumab in December for the bone metastasis.  She received palliative radiation therapy to the right hip, left sacrum/SI joint and upper thoracic spine/sternum completed in February.  She was then given docetaxel/trastuzumab/pertuzumab (THP) for 5 cycles.  Unfortunately, she had a staphylococcal left leg infection, so docetaxel was discontinued in May.  She continues trastuzumab/pertuzumab and will proceed with 10th cycle this week.  She has not yet been started on hormonal therapy.   She reports worsening left shoulder pain. There is tenderness in the left acromioclavicular joint.  I will obtain an x-ray for further evaluation.  I can have her see Dr. Orlene Erm for consideration of palliative radiation if indicated.  If this is not secondary to her malignancy, we could have her see orthopedic surgeon for consideration of local corticosteroid injection  She is taking oxycodone 10 mg every 4 hours  around-the-clock.  She tried MS Contin previously and felt she had difficulty breathing.  I will try her on extended release oxycodone 20 mg every 12 hours.  There is new elevation of the AST, which could be medication effects.  But because of her widespread bone metastasis, I would recommend obtaining CT chest, abdomen and pelvis to reassess her disease baseline as her last imaging was a PET in December 2022.  She will proceed with 10th cycle of trastuzumab/pertuzumab this week.  We will plan to see her back in 3 weeks for repeat clinical assessment prior to and 11th cycle.

## 2022-04-24 NOTE — Assessment & Plan Note (Signed)
History of stage IIB hormone receptor positive breast cancer diagnosed in December 2014.  She was treated with surgery, chemotherapy and radiation therapy. She was on adjuvant hormonal therapy with tamoxifen 20 mg daily from October 2015 to May 2020, but due to elevation of the liver transaminases was switched to anastrazole in June 2020.

## 2022-04-25 ENCOUNTER — Other Ambulatory Visit: Payer: BC Managed Care – PPO

## 2022-04-25 ENCOUNTER — Other Ambulatory Visit: Payer: Self-pay

## 2022-04-25 ENCOUNTER — Encounter: Payer: Self-pay | Admitting: Oncology

## 2022-04-25 DIAGNOSIS — Z5111 Encounter for antineoplastic chemotherapy: Secondary | ICD-10-CM | POA: Diagnosis not present

## 2022-04-25 MED ORDER — OXYCODONE HCL ER 20 MG PO T12A: 20.0000 mg | EXTENDED_RELEASE_TABLET | Freq: Two times a day (BID) | ORAL | 0 refills | Status: DC

## 2022-04-26 ENCOUNTER — Other Ambulatory Visit: Payer: Self-pay | Admitting: Hematology and Oncology

## 2022-04-26 ENCOUNTER — Telehealth: Payer: Self-pay

## 2022-04-26 MED FILL — Pertuzumab Soln for IV Infusion 420 MG/14ML (30 MG/ML): INTRAVENOUS | Qty: 14 | Status: AC

## 2022-04-26 NOTE — Telephone Encounter (Signed)
-----   Message from [name], PA-C sent at 04/26/2022 12:45 PM EDT ----- Please tell her shoulder xray did not reveal fracture or cancer, so she might want to see ortho. Thanks

## 2022-04-26 NOTE — Telephone Encounter (Signed)
Patient notified , she will call [facility] she is a current patient with them.

## 2022-04-27 ENCOUNTER — Telehealth: Payer: Self-pay | Admitting: Hematology and Oncology

## 2022-04-27 ENCOUNTER — Inpatient Hospital Stay: Payer: BC Managed Care – PPO

## 2022-04-27 VITALS — BP 103/67 | HR 104 | Temp 98.5°F | Resp 18 | Ht 59.0 in | Wt 134.5 lb

## 2022-04-27 DIAGNOSIS — C7951 Secondary malignant neoplasm of bone: Secondary | ICD-10-CM

## 2022-04-27 DIAGNOSIS — C50412 Malignant neoplasm of upper-outer quadrant of left female breast: Secondary | ICD-10-CM

## 2022-04-27 DIAGNOSIS — Z5111 Encounter for antineoplastic chemotherapy: Secondary | ICD-10-CM | POA: Diagnosis not present

## 2022-04-27 DIAGNOSIS — Z170 Estrogen receptor positive status [ER+]: Secondary | ICD-10-CM

## 2022-04-27 LAB — SOLUBLE TRANSFERRIN RECEPTOR: Transferrin Receptor: 24.4 nmol/L (ref 12.2–27.3)

## 2022-04-27 MED ORDER — TRASTUZUMAB-DKST CHEMO 150 MG IV SOLR
6.0000 mg/kg | Freq: Once | INTRAVENOUS | Status: AC
  Administered 2022-04-27: 357 mg via INTRAVENOUS
  Filled 2022-04-27: qty 5

## 2022-04-27 MED ORDER — SODIUM CHLORIDE 0.9 % IV SOLN
420.0000 mg | Freq: Once | INTRAVENOUS | Status: AC
  Administered 2022-04-27: 420 mg via INTRAVENOUS
  Filled 2022-04-27: qty 14

## 2022-04-27 MED ORDER — SODIUM CHLORIDE 0.9 % IV SOLN: Freq: Once | INTRAVENOUS | Status: AC

## 2022-04-27 MED ORDER — ACETAMINOPHEN 325 MG PO TABS
650.0000 mg | ORAL_TABLET | Freq: Once | ORAL | Status: AC
  Administered 2022-04-27: 650 mg via ORAL
  Filled 2022-04-27: qty 2

## 2022-04-27 MED ORDER — SODIUM CHLORIDE 0.9% FLUSH
10.0000 mL | INTRAVENOUS | Status: DC | PRN
  Administered 2022-04-27: 10 mL

## 2022-04-27 MED ORDER — HEPARIN SOD (PORK) LOCK FLUSH 100 UNIT/ML IV SOLN
500.0000 [IU] | Freq: Once | INTRAVENOUS | Status: AC | PRN
  Administered 2022-04-27: 500 [IU]

## 2022-04-27 MED ORDER — DIPHENHYDRAMINE HCL 25 MG PO CAPS
25.0000 mg | ORAL_CAPSULE | Freq: Once | ORAL | Status: AC
  Administered 2022-04-27: 25 mg via ORAL
  Filled 2022-04-27: qty 1

## 2022-04-27 NOTE — Patient Instructions (Signed)
Pertuzumab Injection What is this medication? PERTUZUMAB (per TOOZ ue mab) treats breast cancer. It works by blocking a protein that causes cancer cells to grow and multiply. This helps to slow or stop the spread of cancer cells. It is a monoclonal antibody. This medicine may be used for other purposes; ask your health care provider or pharmacist if you have questions. COMMON BRAND NAME(S): PERJETA What should I tell my care team before I take this medication? They need to know if you have any of these conditions: Heart failure An unusual or allergic reaction to pertuzumab, other medications, foods, dyes, or preservatives Pregnant or trying to get pregnant Breast-feeding How should I use this medication? This medication is injected into a vein. It is given by your care team in a hospital or clinic setting. Talk to your care team about the use of this medication in children. Special care may be needed. Overdosage: If you think you have taken too much of this medicine contact a poison control center or emergency room at once. NOTE: This medicine is only for you. Do not share this medicine with others. What if I miss a dose? Keep appointments for follow-up doses. It is important not to miss your dose. Call your care team if you are unable to keep an appointment. What may interact with this medication? Interactions are not expected. This list may not describe all possible interactions. Give your health care provider a list of all the medicines, herbs, non-prescription drugs, or dietary supplements you use. Also tell them if you smoke, drink alcohol, or use illegal drugs. Some items may interact with your medicine. What should I watch for while using this medication? Your condition will be monitored carefully while you are receiving this medication. This medication may make you feel generally unwell. This is not uncommon as chemotherapy can affect healthy cells as well as cancer cells. Report any  side effects. Continue your course of treatment even though you feel ill unless your care team tells you to stop. Talk to your care team if you may be pregnant. Serious birth defects can occur if you take this medication during pregnancy and for 7 months after the last dose. You will need a negative pregnancy test before starting this medication. Contraception is recommended while taking this medication and for 7 months after the last dose. Your care team can help you find the option that works for you. Do not breastfeed while taking this medication and for 7 months after the last dose. What side effects may I notice from receiving this medication? Side effects that you should report to your care team as soon as possible: Allergic reactions or angioedema--skin rash, itching or hives, swelling of the face, eyes, lips, tongue, arms, or legs, trouble swallowing or breathing Heart failure--shortness of breath, swelling of the ankles, feet, or hands, sudden weight gain, unusual weakness or fatigue Infusion reactions--chest pain, shortness of breath or trouble breathing, feeling faint or lightheaded Side effects that usually do not require medical attention (report to your care team if they continue or are bothersome): Diarrhea Dry skin Fatigue Hair loss Nausea Vomiting This list may not describe all possible side effects. Call your doctor for medical advice about side effects. You may report side effects to FDA at 1-800-FDA-1088. Where should I keep my medication? This medication is given in a hospital or clinic. It will not be stored at home. NOTE: This sheet is a summary. It may not cover all possible information. If you have  questions about this medicine, talk to your doctor, pharmacist, or health care provider.  2023 Elsevier/Gold Standard (2021-11-30 00:00:00) Trastuzumab Injection What is this medication? TRASTUZUMAB (tras TOO zoo mab) treats breast cancer and stomach cancer. It works by  blocking a protein that causes cancer cells to grow and multiply. This helps to slow or stop the spread of cancer cells. This medicine may be used for other purposes; ask your health care provider or pharmacist if you have questions. COMMON BRAND NAME(S): Herceptin, Janae Bridgeman, Ontruzant, Trazimera What should I tell my care team before I take this medication? They need to know if you have any of these conditions: Heart failure Lung disease An unusual or allergic reaction to trastuzumab, other medications, foods, dyes, or preservatives Pregnant or trying to get pregnant Breast-feeding How should I use this medication? This medication is injected into a vein. It is given by your care team in a hospital or clinic setting. Talk to your care team about the use of this medication in children. It is not approved for use in children. Overdosage: If you think you have taken too much of this medicine contact a poison control center or emergency room at once. NOTE: This medicine is only for you. Do not share this medicine with others. What if I miss a dose? Keep appointments for follow-up doses. It is important not to miss your dose. Call your care team if you are unable to keep an appointment. What may interact with this medication? Certain types of chemotherapy, such as daunorubicin, doxorubicin, epirubicin, idarubicin This list may not describe all possible interactions. Give your health care provider a list of all the medicines, herbs, non-prescription drugs, or dietary supplements you use. Also tell them if you smoke, drink alcohol, or use illegal drugs. Some items may interact with your medicine. What should I watch for while using this medication? Your condition will be monitored carefully while you are receiving this medication. This medication may make you feel generally unwell. This is not uncommon, as chemotherapy affects healthy cells as well as cancer cells. Report any side  effects. Continue your course of treatment even though you feel ill unless your care team tells you to stop. This medication may increase your risk of getting an infection. Call your care team for advice if you get a fever, chills, sore throat, or other symptoms of a cold or flu. Do not treat yourself. Try to avoid being around people who are sick. Avoid taking medications that contain aspirin, acetaminophen, ibuprofen, naproxen, or ketoprofen unless instructed by your care team. These medications can hide a fever. Talk to your care team if you may be pregnant. Serious birth defects can occur if you take this medication during pregnancy and for 7 months after the last dose. You will need a negative pregnancy test before starting this medication. Contraception is recommended while taking this medication and for 7 months after the last dose. Your care team can help you find the option that works for you. Do not breastfeed while taking this medication and for 7 months after stopping treatment. What side effects may I notice from receiving this medication? Side effects that you should report to your care team as soon as possible: Allergic reactions or angioedema--skin rash, itching or hives, swelling of the face, eyes, lips, tongue, arms, or legs, trouble swallowing or breathing Dry cough, shortness of breath or trouble breathing Heart failure--shortness of breath, swelling of the ankles, feet, or hands, sudden weight gain, unusual weakness  or fatigue Infection--fever, chills, cough, or sore throat Infusion reactions--chest pain, shortness of breath or trouble breathing, feeling faint or lightheaded Side effects that usually do not require medical attention (report to your care team if they continue or are bothersome): Diarrhea Dizziness Headache Nausea Trouble sleeping Vomiting This list may not describe all possible side effects. Call your doctor for medical advice about side effects. You may report  side effects to FDA at 1-800-FDA-1088. Where should I keep my medication? This medication is given in a hospital or clinic. It will not be stored at home. NOTE: This sheet is a summary. It may not cover all possible information. If you have questions about this medicine, talk to your doctor, pharmacist, or health care provider.  2023 Elsevier/Gold Standard (2021-12-12 00:00:00)

## 2022-05-01 ENCOUNTER — Other Ambulatory Visit: Payer: Self-pay | Admitting: Hematology and Oncology

## 2022-05-01 ENCOUNTER — Other Ambulatory Visit: Payer: Self-pay

## 2022-05-01 DIAGNOSIS — C7951 Secondary malignant neoplasm of bone: Secondary | ICD-10-CM

## 2022-05-01 DIAGNOSIS — R0789 Other chest pain: Secondary | ICD-10-CM

## 2022-05-01 DIAGNOSIS — M545 Low back pain, unspecified: Secondary | ICD-10-CM

## 2022-05-01 MED ORDER — OXYCODONE HCL 10 MG PO TABS: 10.0000 mg | ORAL_TABLET | ORAL | 0 refills | Status: DC | PRN

## 2022-05-01 MED ORDER — XTAMPZA ER 18 MG PO C12A: 18.0000 mg | EXTENDED_RELEASE_CAPSULE | Freq: Two times a day (BID) | ORAL | 0 refills | Status: DC

## 2022-05-02 ENCOUNTER — Other Ambulatory Visit: Payer: Self-pay | Admitting: Hematology and Oncology

## 2022-05-02 MED ORDER — XTAMPZA ER 9 MG PO C12A: 2.0000 | EXTENDED_RELEASE_CAPSULE | Freq: Two times a day (BID) | ORAL | 0 refills | Status: DC

## 2022-05-09 ENCOUNTER — Encounter: Payer: Self-pay | Admitting: Oncology

## 2022-05-09 NOTE — Telephone Encounter (Signed)
Late entry from May 01, 2022: Her insurance would not cover OxyContin 20 mg, as Xtampza ER is preferred.  I prescribed Xtampza ER 18 mg twice daily.  She was in agreement, but the pharmacy did not have 18 mg available, so requested I prescribe 9 mg 2 tablets twice daily.  The patient expressed understanding.

## 2022-05-10 ENCOUNTER — Encounter: Payer: Self-pay | Admitting: Hematology and Oncology

## 2022-05-11 ENCOUNTER — Telehealth: Payer: Self-pay

## 2022-05-11 ENCOUNTER — Other Ambulatory Visit: Payer: Self-pay | Admitting: Oncology

## 2022-05-11 DIAGNOSIS — C50412 Malignant neoplasm of upper-outer quadrant of left female breast: Secondary | ICD-10-CM

## 2022-05-11 DIAGNOSIS — C7951 Secondary malignant neoplasm of bone: Secondary | ICD-10-CM

## 2022-05-11 NOTE — Telephone Encounter (Signed)
Malachy Mood reports that are sending the report via fax. Pt has new pathological fracture in T spine. Sent message to [facility] @ 1221-awc.  Kelli Mosher,PA read above @ 1241.

## 2022-05-14 ENCOUNTER — Other Ambulatory Visit: Payer: Self-pay | Admitting: Hematology and Oncology

## 2022-05-14 MED ORDER — NALOXONE HCL 4 MG/0.1ML NA LIQD: NASAL | 1 refills | Status: DC

## 2022-05-15 ENCOUNTER — Telehealth: Payer: Self-pay

## 2022-05-15 ENCOUNTER — Ambulatory Visit: Payer: BC Managed Care – PPO | Admitting: Hematology and Oncology

## 2022-05-15 ENCOUNTER — Other Ambulatory Visit: Payer: BC Managed Care – PPO

## 2022-05-15 NOTE — Telephone Encounter (Signed)
-----   Message from [name], PA-C sent at 05/14/2022  5:51 PM EDT ----- Please let her know insurance wanted her to have a supply of Narcan in case of unintended overdose. I sent in nasal spray to [facility]. Thanks

## 2022-05-15 NOTE — Telephone Encounter (Signed)
Patient notified and voiced understanding.

## 2022-05-16 ENCOUNTER — Inpatient Hospital Stay: Payer: BC Managed Care – PPO | Attending: Hematology and Oncology

## 2022-05-16 ENCOUNTER — Other Ambulatory Visit: Payer: Self-pay

## 2022-05-16 ENCOUNTER — Encounter: Payer: Self-pay | Admitting: Hematology and Oncology

## 2022-05-16 ENCOUNTER — Inpatient Hospital Stay (INDEPENDENT_AMBULATORY_CARE_PROVIDER_SITE_OTHER): Payer: BC Managed Care – PPO | Admitting: Hematology and Oncology

## 2022-05-16 VITALS — BP 100/67 | HR 86 | Temp 98.0°F | Resp 18 | Ht 59.0 in | Wt 130.7 lb

## 2022-05-16 DIAGNOSIS — Z170 Estrogen receptor positive status [ER+]: Secondary | ICD-10-CM | POA: Insufficient documentation

## 2022-05-16 DIAGNOSIS — Z5111 Encounter for antineoplastic chemotherapy: Secondary | ICD-10-CM | POA: Insufficient documentation

## 2022-05-16 DIAGNOSIS — D509 Iron deficiency anemia, unspecified: Secondary | ICD-10-CM

## 2022-05-16 DIAGNOSIS — Z23 Encounter for immunization: Secondary | ICD-10-CM | POA: Diagnosis not present

## 2022-05-16 DIAGNOSIS — I10 Essential (primary) hypertension: Secondary | ICD-10-CM | POA: Insufficient documentation

## 2022-05-16 DIAGNOSIS — Z87891 Personal history of nicotine dependence: Secondary | ICD-10-CM | POA: Diagnosis not present

## 2022-05-16 DIAGNOSIS — C50412 Malignant neoplasm of upper-outer quadrant of left female breast: Secondary | ICD-10-CM | POA: Diagnosis not present

## 2022-05-16 DIAGNOSIS — K760 Fatty (change of) liver, not elsewhere classified: Secondary | ICD-10-CM | POA: Diagnosis not present

## 2022-05-16 DIAGNOSIS — Z5112 Encounter for antineoplastic immunotherapy: Secondary | ICD-10-CM | POA: Diagnosis present

## 2022-05-16 DIAGNOSIS — C7951 Secondary malignant neoplasm of bone: Secondary | ICD-10-CM | POA: Diagnosis not present

## 2022-05-16 LAB — CBC AND DIFFERENTIAL
HCT: 36 (ref 36–46)
Hemoglobin: 11.4 — AB (ref 12.0–16.0)
Neutrophils Absolute: 3.51
Platelets: 335 10*3/uL (ref 150–400)
WBC: 5.4

## 2022-05-16 LAB — HEPATIC FUNCTION PANEL
ALT: 44 U/L — AB (ref 7–35)
AST: 38 — AB (ref 13–35)
Alkaline Phosphatase: 193 — AB (ref 25–125)
Bilirubin, Total: 0.6

## 2022-05-16 LAB — BASIC METABOLIC PANEL
BUN: 16 (ref 4–21)
CO2: 27 — AB (ref 13–22)
Chloride: 107 (ref 99–108)
Creatinine: 0.7 (ref 0.5–1.1)
Glucose: 100
Potassium: 4.2 mEq/L (ref 3.5–5.1)
Sodium: 142 (ref 137–147)

## 2022-05-16 LAB — COMPREHENSIVE METABOLIC PANEL
Albumin: 4 (ref 3.5–5.0)
Calcium: 9.2 (ref 8.7–10.7)

## 2022-05-16 LAB — CBC: RBC: 4.69 (ref 3.87–5.11)

## 2022-05-16 MED ORDER — TOPIRAMATE 25 MG PO TABS: ORAL_TABLET | ORAL | 0 refills | Status: DC

## 2022-05-16 NOTE — Assessment & Plan Note (Signed)
History of stage IIB hormone receptor positive breast cancer diagnosed in December 2014.  She was treated with surgery, chemotherapy and radiation therapy. She was on adjuvant hormonal therapy with tamoxifen 20 mg daily from October 2015 to May 2020, but due to elevation of the liver transaminases was switched to anastrazole in June 2020.

## 2022-05-16 NOTE — Assessment & Plan Note (Signed)
Mild microcytic anemia.  She has had some bright red blood per rectum, which she attributes to hemorrhoids.  Iron studies were equivocal, so soluble transferrin was added today.

## 2022-05-16 NOTE — Progress Notes (Signed)
[facility]  [address] [place],  [place]  [address] [phone]  Clinic Day:  05/16/2022  Referring physician: Bonnita Nasuti, MD  ASSESSMENT & PLAN:   Assessment & Plan: Malignant neoplasm metastatic to bone [facility]) New bone metastases from hormone receptor positive breast cancer diagnosed in November 2022.  There were multiple marrow replacing lesions within the proximal right femur within the ischium, as well as the inferior rami. 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  Estrogen and progesterone receptors were once again positive.  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 Ki67 was 60%. PET imaging in December revealed dominant finding of intensely hypermetabolic aggressive skeletal metastasis involving the calvarium, sternum, thoracic and lumbar spine, and pelvis, with potential pathologic fracture of the right femur with internal fixation. There was soft tissue extension into the anterior mediastinum associated with the manubrial metastasis. Multiple spinal vertebral body lesions have cortical destruction along the posterior margin. She started denosumab monthly in December.    She received radiation therapy to the right hip, left sacrum/SI joint and upper T-spine/sternum in January.  She was then placed on chemotherapy with docetaxel/trastuzumab/pertuzumab and completed 8 cycles.  She continues trastuzumab/pertuzumab as maintenance.    When I saw her prior to 10th cycle, she had worsening left shoulder pain.  Plain films did not reveal any acute osseous abnormality.  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  As she was using oxycodone 10 mg every 4 hours around-the-clock, I recommended extended release oxycodone.  She had tried extended release morphine sulfate prior to this and did not tolerate it because of difficulty breathing.  Extended release oxycodone was not covered by  her insurance, so I placed her on Xtampza ER 18 mg every 12 hours.  She states she did not tolerate the Xtampza due to sensation of difficulty breathing, so she discontinued this.  However, she states she slept well with the initial dose of Xtampza, but then experienced the side affect with the morning dose.  She continues oxycodone 10 mg every 4 hours around-the-clock.  I recommended that she try Xtampza ER 9 mg every 12 hours to see if she will tolerate this.  She has new compression fractures at T12 with greater than 50% loss of height and at L1 with 40 to 50% loss of height since her PET scan in December 2022.  This is the area of her pain.  I will send her to interventional radiology for consideration of kyphoplasty.  She has not yet been started on hormonal therapy, but will wait to see how the transaminases respond to discontinuing Topamax.  She will proceed with an 11th cycle of trastuzumab/pertuzumab this week.  We will plan to see her back in 3 weeks for repeat clinical assessment prior to a 12th cycle.  Malignant neoplasm of upper-outer quadrant of left female breast [facility]) History of stage IIB hormone receptor positive breast cancer diagnosed in December 2014.  She was treated with surgery, chemotherapy and radiation therapy. She was on adjuvant hormonal therapy with tamoxifen 20 mg daily from October 2015 to May 2020, but due to elevation of the liver transaminases was switched to anastrazole in June 2020.   Hepatic steatosis determined by biopsy of liver Known hepatic steatosis with recurrent elevation of the transaminases with uncertain etiology.  It is not felt to be associated with the HER2 targeted therapy.  It may be related to other  medications, as she is on several can contribute to this, including Topamax, which she has been on for years for migraines.  She has not struggled with headaches at all in recent years.  She is willing to try to taper her Topamax, so I gave her schedule for  tapering her Topamax.  Microcytic anemia Mild microcytic anemia.  She has had some bright red blood per rectum, which she attributes to hemorrhoids.  Iron studies were equivocal, so soluble transferrin was added today.   The patient understands the plans discussed today and is in agreement with them.  She knows to contact our office if she develops concerns prior to her next appointment.   I provided 30 minutes of face-to-face time during this encounter and > 50% was spent counseling as documented under my assessment and plan.    Anna Pickles, PA-C  [facility] [address] [facility] [address] [place] [address] Dept: [phone] Dept Fax: [phone]   Orders Placed This Encounter  Procedures   IR KYPHO EA ADDL LEVEL THORACIC OR LUMBAR    Standing Status:   Future    Standing Expiration Date:   05/17/2023    Order Specific Question:   Reason for Exam (SYMPTOM  OR DIAGNOSIS REQUIRED)    Answer:   Pathologic fractures of T12, L1 with low back pain, T3, T4 with left shoulder pain    Order Specific Question:   Is the patient pregnant?    Answer:   No    Order Specific Question:   Preferred Imaging Location?    Answer:   External    Comments:   [facility]   Soluble transferrin receptor    Standing Status:   Future    Number of Occurrences:   1    Standing Expiration Date:   05/17/2023   CBC and differential    This external order was created through the Results Console.   CBC    This external order was created through the Results Console.   Basic metabolic panel    This external order was created through the Results Console.   Comprehensive metabolic panel    This external order was created through the Results Console.   Hepatic function panel    This external order was created through the Results Console.      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 metastatic to bone  Current Treatment:  Trastuzumab/pertuzumab/denosumab  HISTORY OF PRESENT ILLNESS:   Oncology History  Malignant neoplasm of upper-outer quadrant of left female breast ([place])  07/23/2013 Initial Diagnosis   Malignant neoplasm of upper-outer quadrant of left female breast ([place])   07/23/2013 Cancer Staging   Staging form: Breast, AJCC 7th Edition - Clinical stage from 07/23/2013: Stage IIB (T2, N1, M0) - Signed by Derwood Kaplan, MD on 07/04/2020 Prognostic indicators: Pos LVI   09/22/2021 - 04/06/2022 Chemotherapy   Patient is on Treatment Plan : BREAST  Trastuzumab + Pertuzumab q21d      09/22/2021 -  Chemotherapy   Patient is on Treatment Plan : BREAST DOCEtaxel + Trastuzumab + Pertuzumab (THP) q21d x 8 cycles / Trastuzumab + Pertuzumab q21d x 4 cycles     Malignant neoplasm metastatic to bone ([place])  06/28/2021 Initial Diagnosis   Bone metastases ([place])   09/22/2021 - 04/06/2022 Chemotherapy   Patient is on Treatment Plan : BREAST  Trastuzumab + Pertuzumab q21d      09/22/2021 -  Chemotherapy   Patient is on Treatment Plan : BREAST DOCEtaxel + Trastuzumab + Pertuzumab (THP) q21d x 8 cycles / Trastuzumab + Pertuzumab q21d x 4 cycles     05/11/2022 Imaging   CT chest, abdomen and pelvis:  IMPRESSION:  1. Widespread bony metastatic disease shows increased sclerosis  since previous imaging. This is likely related to response to  therapy in the interval. There is a single lesion along the LEFT  iliac which shows continued lytic change and warrants attention on  follow-up  2. Interval development of pathologic fractures at in the upper  thoracic spine and at the thoracolumbar junction with associated  kyphosis.  3. No signs of solid organ or nodal metastatic disease.  4. Marked elevation of the LEFT hemidiaphragm as on the prior PET.  5. Aortic atherosclerosis.        INTERVAL HISTORY:  Anna [name] is here today for repeat clinical assessment prior to an 11th cycle of trastuzumab/pertuzumab.  She  states she did not tolerate Xtampza 18 mg due to a sensation of having trouble breathing so she stopped it after 2 doses.  However, she slept well late that the night of her first dose.  She continues oxycodone 10 mg every 4 hours around-the-clock for left arm, low back and stomach pain. She denies fevers or chills.  Her appetite is decreased. Her weight has decreased 4 pounds over last 3 weeks .  REVIEW OF SYSTEMS:  Review of Systems  Constitutional:  Negative for appetite change, chills, fatigue, fever and unexpected weight change.  HENT:   Negative for lump/mass, mouth sores and sore throat.   Respiratory:  Negative for cough and shortness of breath.   Cardiovascular:  Negative for chest pain and leg swelling.  Gastrointestinal:  Negative for abdominal pain, constipation, diarrhea, nausea and vomiting.  Endocrine: Negative for hot flashes.  Genitourinary:  Negative for difficulty urinating, dysuria, frequency and hematuria.   Musculoskeletal:  Negative for arthralgias, back pain and myalgias.  Skin:  Negative for rash.  Neurological:  Negative for dizziness and headaches.  Hematological:  Negative for adenopathy. Does not bruise/bleed easily.  Psychiatric/Behavioral:  Negative for depression and sleep disturbance. The patient is not nervous/anxious.      VITALS:  Blood pressure 100/67, pulse 86, temperature 98 F (36.7 C), temperature source Oral, resp. rate 18, height 4' 11"  (1.499 m), weight 130 lb 11.2 oz (59.3 kg), last menstrual period 08/18/2013, SpO2 99 %.  Wt Readings from Last 3 Encounters:  05/16/22 130 lb 11.2 oz (59.3 kg)  04/27/22 134 lb 8 oz (61 kg)  04/24/22 134 lb (60.8 kg)    Body mass index is 26.4 kg/m.  Performance status (ECOG): 2 - Symptomatic, <50% confined to bed  PHYSICAL EXAM:  Physical Exam Vitals and nursing note reviewed.  Constitutional:      General: She is not in acute distress.    Appearance: Normal appearance.  HENT:     Head: Normocephalic  and atraumatic.     Mouth/Throat:     Mouth: Mucous membranes are moist.     Pharynx: Oropharynx is clear. No oropharyngeal exudate or posterior oropharyngeal erythema.  Eyes:     General: No scleral icterus.    Extraocular Movements: Extraocular movements intact.     Conjunctiva/sclera: Conjunctivae normal.     Pupils: Pupils are equal, round, and reactive to light.  Cardiovascular:     Rate and Rhythm: Normal rate and regular rhythm.     Heart sounds: Normal heart sounds.  No murmur heard.    No friction rub. No gallop.  Pulmonary:     Effort: Pulmonary effort is normal.     Breath sounds: Normal breath sounds. No wheezing, rhonchi or rales.  Abdominal:     General: There is no distension.     Palpations: Abdomen is soft. There is no hepatomegaly, splenomegaly or mass.     Tenderness: There is no abdominal tenderness.  Musculoskeletal:        General: Normal range of motion.     Cervical back: Normal range of motion and neck supple. No tenderness.     Right lower leg: No edema.     Left lower leg: No edema.  Lymphadenopathy:     Cervical: No cervical adenopathy.     Upper Body:     Right upper body: No supraclavicular or axillary adenopathy.     Left upper body: No supraclavicular or axillary adenopathy.     Lower Body: No right inguinal adenopathy. No left inguinal adenopathy.  Skin:    General: Skin is warm and dry.     Coloration: Skin is not jaundiced.     Findings: No rash.  Neurological:     Mental Status: She is alert and oriented to person, place, and time.     Cranial Nerves: No cranial nerve deficit.  Psychiatric:        Mood and Affect: Mood normal.        Behavior: Behavior normal.        Thought Content: Thought content normal.     LABS:      Latest Ref Rng & Units 05/16/2022   12:00 AM 04/24/2022   12:00 AM 04/04/2022   12:00 AM  CBC  WBC  5.4     5.4     6.1      Hemoglobin 12.0 - 16.0 11.4     11.6     11.7      Hematocrit 36 - 46 36     37     38       Platelets 150 - 400 K/uL 335     279     237         This result is from an external source.      Latest Ref Rng & Units 05/16/2022   12:00 AM 04/24/2022   12:00 AM 04/04/2022   12:00 AM  CMP  BUN 4 - 21 16     15     14       Creatinine 0.5 - 1.1 0.7     0.6     0.6      Sodium 137 - 147 142     140     141      Potassium 3.5 - 5.1 mEq/L 4.2     3.7     3.7      Chloride 99 - 108 107     105     106      CO2 13 - 22 27     31     29       Calcium 8.7 - 10.7 9.2     9.0     9.4      Alkaline Phos 25 - 125 193     109     89      AST 13 - 35 38     69     23      ALT 7 -  35 U/L 44     20     16         This result is from an external source.     Lab Results  Component Value Date   CEA1 2.2 07/20/2021   /  CEA  Date Value Ref Range Status  07/20/2021 2.2 0.0 - 4.7 ng/mL Final    Comment:    (NOTE)                             Nonsmokers          <3.9                             Smokers             <5.6 [employer] Electrochemiluminescence Immunoassay (ECLIA) Values obtained with different assay methods or kits cannot be used interchangeably.  Results cannot be interpreted as absolute evidence of the presence or absence of malignant disease. Performed At: [facility] [place], [place] [id number] [place] MD XL:[id number]    No results found for: "PSA1" No results found for: "CAN199" No results found for: "CAN125"  No results found for: "TOTALPROTELP", "ALBUMINELP", "A1GS", "A2GS", "BETS", "BETA2SER", "GAMS", "MSPIKE", "SPEI" Lab Results  Component Value Date   TIBC 287 04/24/2022   TIBC 279 10/13/2021   FERRITIN 119 04/24/2022   FERRITIN 206 10/13/2021   IRONPCTSAT 9 (L) 04/24/2022   IRONPCTSAT 13 10/13/2021   No results found for: "LDH"  STUDIES:  No results found.    Exam(s): [id number] CT/CT CHEST-ABD-PELV W/IV CM  CLINICAL DATA: A 51 year old female with history of breast cancer  presents for follow-up evaluation.   * Tracking Code: BO *  EXAM:  CT CHEST, ABDOMEN, AND PELVIS WITH CONTRAST  TECHNIQUE:  Multidetector CT imaging of the chest, abdomen and pelvis was  performed following the standard protocol during bolus  administration of intravenous contrast.   RADIATION DOSE REDUCTION: This exam was performed according to the  departmental dose-optimization program which includes automated  exposure control, adjustment of the mA and/or kV according to  patient size and/or use of iterative reconstruction technique.   CONTRAST: 100 cc Isovue 370  COMPARISON: PET exam of July 31, 2021 and CT of the abdomen and  pelvis from May of 2020.   FINDINGS:  CT CHEST FINDINGS  Cardiovascular: RIGHT-sided Port-A-Cath terminating in the upper  RIGHT atrium. Cardiac structures displaced from LEFT to RIGHT due to  marked elevation of the LEFT hemidiaphragm as on the prior PET. No  pericardial effusion or nodularity. Normal caliber of the thoracic  aorta with signs of mild atherosclerotic change. Normal caliber of  central pulmonary vasculature.  Mediastinum/Nodes: No adenopathy in the chest. Esophagus grossly  normal.  Lungs/Pleura: Juxta diaphragmatic atelectasis in the LEFT chest due  to marked elevation of the LEFT hemidiaphragm. RIGHT lung well  expanded. No signs of pleural effusion, suspicious nodule or  consolidative change.  Musculoskeletal: See below for full musculoskeletal details.  Nodularity in the LEFT breast do not show increased metabolic  activity on the prior study measuring 13 mm (image 41/2)   CT ABDOMEN PELVIS FINDINGS  Hepatobiliary: No focal, suspicious hepatic lesion. No  pericholecystic stranding. No biliary duct dilation. Portal vein is  patent.  Pancreas: Normal, without mass, inflammation or ductal dilatation.  Spleen: Normal.  Adrenals/Urinary Tract: No suspicious renal lesion,  hydronephrosis  or perinephric stranding.  Large Bosniak category I cyst arises from the  lower pole of the  RIGHT kidney measuring 11 x 10 mm. Kidney with transverse lie as  result of this large cyst. No additional dedicated imaging follow-up  of this finding is recommended in the absence of localizing  symptoms.  Adrenal glands are normal.  Stomach/Bowel: Displacement of bowel loops in the abdomen secondary  to large RIGHT renal cyst. No sign of bowel obstruction or acute  bowel process no stranding adjacent to the stomach.  Vascular/Lymphatic:  Aortic atherosclerosis. No sign of aneurysm. Smooth contour of the  IVC. There is no gastrohepatic or hepatoduodenal ligament  lymphadenopathy. No retroperitoneal or mesenteric lymphadenopathy.  No pelvic sidewall lymphadenopathy.  Reproductive: Unremarkable by CT.  Other: Stranding about the LEFT breast with skin thickening,  increased compared to prior imaging. No signs of abdominal hernia.  No signs of ascites in the abdomen.  Musculoskeletal: Widespread bony metastatic disease shows increased  sclerosis since previous imaging, for example lytic lesion at T2  currently measuring 16 mm previously measuring 16 mm but with  increased sclerosis (image 16/2)  Areas of marked lytic change on the prior study in multiple other  areas of the spine and pelvis displaying similar increased bony  sclerosis since previous imaging. For example, along the LEFT sacral  ala where there was a frankly lytic area along the sacroiliac joint  now displaying bony sclerosis without lytic changes in this area.  This likely represents marked response to therapy in the interval  since the prior study. Persistent lucency noted in the mid iliac  bone on the LEFT (image 84/2) 10 mm previously 8 mm.  Since previous imaging there as 0 ever been greater than 50% loss of  height at T12 and 40-50% loss of height at L1 compatible with  pathologic fracture since the prior PET scan also with anterior  wedging of upper thoracic vertebral bodies, particularly T3 and  T4  similarly with pathologic fracture in the interval since prior  imaging. Kyphosis in the area of the upper thoracic spine and at the  thoracolumbar junction as result of these fractures.  Post RIGHT hip arthroplasty incompletely evaluated with signs of  heterotopic ossification.   IMPRESSION:  1. Widespread bony metastatic disease shows increased sclerosis  since previous imaging. This is likely related to response to  therapy in the interval. There is a single lesion along the LEFT  iliac which shows continued lytic change and warrants attention on  follow-up  2. Interval development of pathologic fractures at in the upper  thoracic spine and at the thoracolumbar junction with associated  kyphosis.  3. No signs of solid organ or nodal metastatic disease.  4. Marked elevation of the LEFT hemidiaphragm as on the prior PET.  5. Aortic atherosclerosis.    HISTORY:   Past Medical History:  Diagnosis Date   Achilles tendinitis of left lower extremity 04/22/2018   Acute peptic ulcer without hemorrhage and without perforation 11/29/2020   Acute sinusitis 11/29/2020   Bipolar disorder ([place]) 11/29/2020   Breast cancer ([place])    Cancer ([place])    Cardiac murmur    Chest pain    Chronic fatigue syndrome 11/29/2020   Depression    Essential hypertension 11/29/2020   Hepatic steatosis determined by biopsy of liver 07/04/2020   HLD (hyperlipidemia)    Hypertension    Hypothyroidism 11/29/2020   Insomnia 11/29/2020   Malignant neoplasm of upper-outer quadrant of left  female breast ([place]) 07/23/2013   Migraine 11/29/2020   Mild intermittent asthma 11/29/2020   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   Mixed hyperlipidemia 11/29/2020   Obesity due to excess calories    Osteopenia after menopause 07/04/2020   Other long term (current) drug therapy 11/29/2020   Other vitamin B12 deficiency anemias 11/29/2020   Personal history of malignant neoplasm of breast 11/29/2020   Pre-diabetes     Renal cyst, acquired, right 07/04/2020   14.5 cm in October 2021   Retrocalcaneal bursitis (back of heel), left 04/22/2018   Seasonal allergic rhinitis 11/29/2020   Tightness of heel cord, left 04/22/2018   Type 2 diabetes mellitus without complications ([place]) 1/69/6789   Vitamin D deficiency 11/29/2020    Past Surgical History:  Procedure Laterality Date   CESAREAN SECTION     LIVER BIOPSY  01/2019   lumpectomy  2014   Left Breast   PORT-A-CATH REMOVAL     PORTACATH PLACEMENT  08/28/2013   STOMACH SURGERY     Tummy Tuck   TOTAL HIP ARTHROPLASTY Right 07/04/2021   Procedure: TOTAL HIP ARTHROPLASTY;  Surgeon: Paralee Cancel, MD;  Location: [facility] ORS;  Service: Orthopedics;  Laterality: Right;  40   WISDOM TOOTH EXTRACTION      Family History  Problem Relation Age of Onset   Hyperlipidemia Mother    Diabetes Father    Stroke Father    Colon cancer Neg Hx    Colon polyps Neg Hx    Esophageal cancer Neg Hx    Rectal cancer Neg Hx    Stomach cancer Neg Hx     Social History:  reports that she quit smoking about 19 years ago. Her smoking use included cigarettes. She has never used smokeless tobacco. She reports current alcohol use. She reports that she does not use drugs.The patient is accompanied by her husband today.  Allergies: No Known Allergies  Current Medications: Current Outpatient Medications  Medication Sig Dispense Refill   topiramate (TOPAMAX) 25 MG tablet Alternate with 50 mg daily for 1 week, then take 25 mg daily for 1 week, then take 25 mg every other day for 1 week then stop 30 tablet 0   albuterol (VENTOLIN HFA) 108 (90 Base) MCG/ACT inhaler Inhale 2 puffs into the lungs every 6 (six) hours as needed for shortness of breath.     ARIPiprazole (ABILIFY) 5 MG tablet Take 2.5 mg by mouth daily.     aspirin 81 MG EC tablet Take 81 mg by mouth daily. Swallow whole.     atorvastatin (LIPITOR) 20 MG tablet Take 1 tablet (20 mg total) by mouth daily. 90 tablet 3   augmented  betamethasone dipropionate (DIPROLENE-AF) 0.05 % cream betamethasone, augmented 0.05 % topical cream     calcium carbonate (OS-CAL) 1250 (500 Ca) MG chewable tablet Chew 6 tablets by mouth daily.     CLENPIQ 10-3.5-12 MG-GM -GM/160ML SOLN      desvenlafaxine (PRISTIQ) 50 MG 24 hr tablet Take 50 mg by mouth daily.     diphenoxylate-atropine (LOMOTIL) 2.5-0.025 MG tablet TAKE TWO TABLETS BY MOUTH FOUR TIMES A DAY AS NEEDED FOR DIARRHEA OR LOOSE STOOLS 100 tablet 1   FARXIGA 10 MG TABS tablet Take 1 tablet (10 mg total) by mouth daily. 30 tablet 11   furosemide (LASIX) 20 MG tablet Take 1 tablet (20 mg total) by mouth daily. 30 tablet 0   labetalol (NORMODYNE) 200 MG tablet Take 1 tablet (200 mg total) by mouth 2 (  two) times daily. 180 tablet 3   Loratadine 10 MG CAPS Take 1 capsule (10 mg total) by mouth daily. 30 capsule 6   LORazepam (ATIVAN) 1 MG tablet Take 1 tablet (1 mg total) by mouth every 8 (eight) hours. 30 tablet 0   magic mouthwash (lidocaine, diphenhydrAMINE, alum & mag hydroxide) suspension Take 5 mLs by mouth every 3 (three) hours as needed. Swish and spit; for throat/mouth pain     naloxone (NARCAN) nasal spray 4 mg/0.1 mL One spray in nostril as needed, may repeat every 2 to 3 minutes until medical assistance becomes available 4 each 1   nitroGLYCERIN (NITROSTAT) 0.4 MG SL tablet Place 0.4 mg under the tongue every 5 (five) minutes as needed for chest pain. (Patient not taking: Reported on 11/23/2021)     omeprazole (PRILOSEC) 40 MG capsule      ondansetron (ZOFRAN) 4 MG tablet Take 1 tablet (4 mg total) by mouth every 4 (four) hours as needed for nausea. 90 tablet 3   oxyCODONE ER (XTAMPZA ER) 9 MG C12A Take 2 tablets by mouth 2 (two) times daily. (Patient not taking: Reported on 05/16/2022) 120 capsule 0   Oxycodone HCl 10 MG TABS Take 1 tablet (10 mg total) by mouth every 4 (four) hours as needed. 120 tablet 0   OZEMPIC, 0.25 OR 0.5 MG/DOSE, 2 MG/3ML SOPN Inject into the skin.      potassium chloride SA (KLOR-CON M) 20 MEQ tablet Take 1 tablet (20 mEq total) by mouth 2 (two) times daily. 60 tablet 0   prochlorperazine (COMPAZINE) 10 MG tablet Take 1 tablet (10 mg total) by mouth every 6 (six) hours as needed for nausea or vomiting. 90 tablet 3   topiramate (TOPAMAX) 50 MG tablet Take 50 mg by mouth daily.     traMADol (ULTRAM) 50 MG tablet      valsartan (DIOVAN) 40 MG tablet Take 1 tablet (40 mg total) by mouth daily. 90 tablet 3   Vitamin D, Ergocalciferol, (DRISDOL) 1.25 MG (50000 UNIT) CAPS capsule Take 50,000 Units by mouth once a week.     Current Facility-Administered Medications  Medication Dose Route Frequency Provider Last Rate Last Admin   0.9 %  sodium chloride infusion  500 mL Intravenous Once Jackquline Denmark, MD       technetium sestamibi generic (CARDIOLITE) injection 92.4 millicurie  26.8 millicurie Intravenous Once PRN Revankar, Reita Cliche, MD

## 2022-05-16 NOTE — Assessment & Plan Note (Addendum)
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  There were multiple marrow replacing lesions within the proximal right femur within the ischium, as well as the inferior rami. 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  Estrogen and progesterone receptors were once again positive.  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 Ki67 was 60%. PET imaging in December revealed dominant finding of intensely hypermetabolic aggressive skeletal metastasis involving the calvarium, sternum, thoracic and lumbar spine, and pelvis, with potential pathologic fracture of the right femur with internal fixation. There was soft tissue extension into the anterior mediastinum associated with the manubrial metastasis. Multiple spinal vertebral body lesions have cortical destruction along the posterior margin.She started denosumab monthly in December.    She received radiation therapy to the right hip, left sacrum/SI joint and upper T-spine/sternum in January. She was then placed on chemotherapy with docetaxel/trastuzumab/pertuzumab and completed 8 cycles.  She continues trastuzumab/pertuzumab as maintenance.    When I saw her prior to 10th cycle, she had worsening left shoulder pain.  Plain films did not reveal any acute osseous abnormality.  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  As she was using oxycodone 10 mg every 4 hours around-the-clock, I recommended extended release oxycodone.  She had tried extended release morphine sulfate prior to this and did not tolerate it because of difficulty breathing.  Extended release oxycodone was not covered by her insurance, so I placed her on Xtampza ER 18 mg every 12 hours.  She states she did not tolerate the Xtampza due to sensation of difficulty breathing, so she discontinued this.  However, she states she slept well with the initial dose of Xtampza, but then  experienced the side affect with the morning dose.  She continues oxycodone 10 mg every 4 hours around-the-clock.  I recommended that she try Xtampza ER 9 mg every 12 hours to see if she will tolerate this.  She has new compression fractures at T12 with greater than 50% loss of height and at L1 with 40 to 50% loss of height since her PET scan in December 2022.  This is the area of her pain.  I will send her to interventional radiology for consideration of kyphoplasty.  She has not yet been started on hormonal therapy, but will wait to see how the transaminases respond to discontinuing Topamax.  She will proceed with an 11th cycle of trastuzumab/pertuzumab this week.  We will plan to see her back in 3 weeks for repeat clinical assessment prior to a 12th cycle. 

## 2022-05-16 NOTE — Assessment & Plan Note (Addendum)
Known hepatic steatosis with recurrent elevation of the transaminases with uncertain etiology.  It is not felt to be associated with the HER2 targeted therapy.  It may be related to other medications, as she is on several can contribute to this, including Topamax, which she has been on for years for migraines.  She has not struggled with headaches at all in recent years.  She is willing to try to taper her Topamax, so I gave her schedule for tapering her Topamax.

## 2022-05-17 ENCOUNTER — Telehealth: Payer: Self-pay | Admitting: Hematology and Oncology

## 2022-05-17 ENCOUNTER — Other Ambulatory Visit: Payer: Self-pay | Admitting: Hematology and Oncology

## 2022-05-17 DIAGNOSIS — C7951 Secondary malignant neoplasm of bone: Secondary | ICD-10-CM

## 2022-05-17 MED FILL — Trastuzumab-dkst For IV Soln 150 MG: INTRAVENOUS | Qty: 17 | Status: AC

## 2022-05-17 MED FILL — Pertuzumab Soln for IV Infusion 420 MG/14ML (30 MG/ML): INTRAVENOUS | Qty: 14 | Status: AC

## 2022-05-17 NOTE — Telephone Encounter (Signed)
105/23 spoke with Cathy(DRI)she will call patient and scheduled kyphoplasty

## 2022-05-18 ENCOUNTER — Inpatient Hospital Stay: Payer: BC Managed Care – PPO

## 2022-05-18 VITALS — BP 100/69 | HR 99 | Temp 99.3°F | Ht 59.0 in | Wt 131.4 lb

## 2022-05-18 DIAGNOSIS — Z5111 Encounter for antineoplastic chemotherapy: Secondary | ICD-10-CM | POA: Diagnosis not present

## 2022-05-18 DIAGNOSIS — Z170 Estrogen receptor positive status [ER+]: Secondary | ICD-10-CM

## 2022-05-18 DIAGNOSIS — C7951 Secondary malignant neoplasm of bone: Secondary | ICD-10-CM

## 2022-05-18 LAB — SOLUBLE TRANSFERRIN RECEPTOR: Transferrin Receptor: 31.9 nmol/L — ABNORMAL HIGH (ref 12.2–27.3)

## 2022-05-18 MED ORDER — HEPARIN SOD (PORK) LOCK FLUSH 100 UNIT/ML IV SOLN
500.0000 [IU] | Freq: Once | INTRAVENOUS | Status: AC | PRN
  Administered 2022-05-18: 500 [IU]

## 2022-05-18 MED ORDER — SODIUM CHLORIDE 0.9 % IV SOLN
420.0000 mg | Freq: Once | INTRAVENOUS | Status: AC
  Administered 2022-05-18: 420 mg via INTRAVENOUS
  Filled 2022-05-18: qty 14

## 2022-05-18 MED ORDER — TRASTUZUMAB-DKST CHEMO 150 MG IV SOLR
6.0000 mg/kg | Freq: Once | INTRAVENOUS | Status: AC
  Administered 2022-05-18: 357 mg via INTRAVENOUS
  Filled 2022-05-18: qty 17

## 2022-05-18 MED ORDER — DIPHENHYDRAMINE HCL 25 MG PO CAPS
25.0000 mg | ORAL_CAPSULE | Freq: Once | ORAL | Status: AC
  Administered 2022-05-18: 25 mg via ORAL
  Filled 2022-05-18: qty 1

## 2022-05-18 MED ORDER — ACETAMINOPHEN 325 MG PO TABS
650.0000 mg | ORAL_TABLET | Freq: Once | ORAL | Status: AC
  Administered 2022-05-18: 650 mg via ORAL
  Filled 2022-05-18: qty 2

## 2022-05-18 MED ORDER — SODIUM CHLORIDE 0.9 % IV SOLN: Freq: Once | INTRAVENOUS | Status: AC

## 2022-05-18 MED ORDER — SODIUM CHLORIDE 0.9% FLUSH
10.0000 mL | INTRAVENOUS | Status: DC | PRN
  Administered 2022-05-18: 10 mL

## 2022-05-18 NOTE — Patient Instructions (Signed)
[employer]  Discharge Instructions: Thank you for choosing [facility] to provide your oncology and hematology care.  If you have a lab appointment with the [facility], please go directly to the [facility] and check in at the registration area.   Wear comfortable clothing and clothing appropriate for easy access to any Portacath or PICC line.   We strive to give you quality time with your provider. You may need to reschedule your appointment if you arrive late (15 or more minutes).  Arriving late affects you and other patients whose appointments are after yours.  Also, if you miss three or more appointments without notifying the office, you may be dismissed from the clinic at the provider's discretion.      For prescription refill requests, have your pharmacy contact our office and allow 72 hours for refills to be completed.    Today you received the following chemotherapy and/or immunotherapy agents  To help prevent nausea and vomiting after your treatment, we encourage you to take your nausea medication as directed.  BELOW ARE SYMPTOMS THAT SHOULD BE REPORTED IMMEDIATELY: *FEVER GREATER THAN 100.4 F (38 C) OR HIGHER *CHILLS OR SWEATING *NAUSEA AND VOMITING THAT IS NOT CONTROLLED WITH YOUR NAUSEA MEDICATION *UNUSUAL SHORTNESS OF BREATH *UNUSUAL BRUISING OR BLEEDING *URINARY PROBLEMS (pain or burning when urinating, or frequent urination) *BOWEL PROBLEMS (unusual diarrhea, constipation, pain near the anus) TENDERNESS IN MOUTH AND THROAT WITH OR WITHOUT PRESENCE OF ULCERS (sore throat, sores in mouth, or a toothache) UNUSUAL RASH, SWELLING OR PAIN  UNUSUAL VAGINAL DISCHARGE OR ITCHING   Items with * indicate a potential emergency and should be followed up as soon as possible or go to the Emergency Department if any problems should occur.  Please show the CHEMOTHERAPY ALERT CARD or IMMUNOTHERAPY ALERT CARD at check-in to the Emergency  Department and triage nurse.  Should you have questions after your visit or need to cancel or reschedule your appointment, please contact [facility]  Dept: [phone]  and follow the prompts.  Office hours are 8:00 a.m. to 4:30 p.m. Monday - Friday. Please note that voicemails left after 4:00 p.m. may not be returned until the following business day.  We are closed weekends and major holidays. You have access to a nurse at all times for urgent questions. Please call the main number to the clinic Dept: [phone] and follow the prompts.  For any non-urgent questions, you may also contact your provider using MyChart. We now offer e-Visits for anyone 22 and older to request care online for non-urgent symptoms. For details visit [email or web address].   Also download the MyChart app! Go to the app store, search "MyChart", open the app, select [facility], and log in with your MyChart username and password.  Masks are optional in the cancer centers. If you would like for your care team to wear a mask while they are taking care of you, please let them know. You may have one support person who is at least 51 years old accompany you for your appointments. Pertuzumab Injection What is this medication? PERTUZUMAB (per TOOZ ue mab) treats breast cancer. It works by blocking a protein that causes cancer cells to grow and multiply. This helps to slow or stop the spread of cancer cells. It is a monoclonal antibody. This medicine may be used for other purposes; ask your health care provider or pharmacist if you have questions. COMMON BRAND NAME(S): PERJETA What should  I tell my care team before I take this medication? They need to know if you have any of these conditions: Heart failure An unusual or allergic reaction to pertuzumab, other medications, foods, dyes, or preservatives Pregnant or trying to get pregnant Breast-feeding How should I use this medication? This medication  is injected into a vein. It is given by your care team in a hospital or clinic setting. Talk to your care team about the use of this medication in children. Special care may be needed. Overdosage: If you think you have taken too much of this medicine contact a poison control center or emergency room at once. NOTE: This medicine is only for you. Do not share this medicine with others. What if I miss a dose? Keep appointments for follow-up doses. It is important not to miss your dose. Call your care team if you are unable to keep an appointment. What may interact with this medication? Interactions are not expected. This list may not describe all possible interactions. Give your health care provider a list of all the medicines, herbs, non-prescription drugs, or dietary supplements you use. Also tell them if you smoke, drink alcohol, or use illegal drugs. Some items may interact with your medicine. What should I watch for while using this medication? Your condition will be monitored carefully while you are receiving this medication. This medication may make you feel generally unwell. This is not uncommon as chemotherapy can affect healthy cells as well as cancer cells. Report any side effects. Continue your course of treatment even though you feel ill unless your care team tells you to stop. Talk to your care team if you may be pregnant. Serious birth defects can occur if you take this medication during pregnancy and for 7 months after the last dose. You will need a negative pregnancy test before starting this medication. Contraception is recommended while taking this medication and for 7 months after the last dose. Your care team can help you find the option that works for you. Do not breastfeed while taking this medication and for 7 months after the last dose. What side effects may I notice from receiving this medication? Side effects that you should report to your care team as soon as  possible: Allergic reactions or angioedema--skin rash, itching or hives, swelling of the face, eyes, lips, tongue, arms, or legs, trouble swallowing or breathing Heart failure--shortness of breath, swelling of the ankles, feet, or hands, sudden weight gain, unusual weakness or fatigue Infusion reactions--chest pain, shortness of breath or trouble breathing, feeling faint or lightheaded Side effects that usually do not require medical attention (report to your care team if they continue or are bothersome): Diarrhea Dry skin Fatigue Hair loss Nausea Vomiting This list may not describe all possible side effects. Call your doctor for medical advice about side effects. You may report side effects to FDA at 1-800-FDA-1088. Where should I keep my medication? This medication is given in a hospital or clinic. It will not be stored at home. NOTE: This sheet is a summary. It may not cover all possible information. If you have questions about this medicine, talk to your doctor, pharmacist, or health care provider.  2023 Elsevier/Gold Standard (2021-11-30 00:00:00) Trastuzumab Injection What is this medication? TRASTUZUMAB (tras TOO zoo mab) treats breast cancer and stomach cancer. It works by blocking a protein that causes cancer cells to grow and multiply. This helps to slow or stop the spread of cancer cells. This medicine may be used for other  purposes; ask your health care provider or pharmacist if you have questions. COMMON BRAND NAME(S): Herceptin, Janae Bridgeman, Ontruzant, Trazimera What should I tell my care team before I take this medication? They need to know if you have any of these conditions: Heart failure Lung disease An unusual or allergic reaction to trastuzumab, other medications, foods, dyes, or preservatives Pregnant or trying to get pregnant Breast-feeding How should I use this medication? This medication is injected into a vein. It is given by your care team in a  hospital or clinic setting. Talk to your care team about the use of this medication in children. It is not approved for use in children. Overdosage: If you think you have taken too much of this medicine contact a poison control center or emergency room at once. NOTE: This medicine is only for you. Do not share this medicine with others. What if I miss a dose? Keep appointments for follow-up doses. It is important not to miss your dose. Call your care team if you are unable to keep an appointment. What may interact with this medication? Certain types of chemotherapy, such as daunorubicin, doxorubicin, epirubicin, idarubicin This list may not describe all possible interactions. Give your health care provider a list of all the medicines, herbs, non-prescription drugs, or dietary supplements you use. Also tell them if you smoke, drink alcohol, or use illegal drugs. Some items may interact with your medicine. What should I watch for while using this medication? Your condition will be monitored carefully while you are receiving this medication. This medication may make you feel generally unwell. This is not uncommon, as chemotherapy affects healthy cells as well as cancer cells. Report any side effects. Continue your course of treatment even though you feel ill unless your care team tells you to stop. This medication may increase your risk of getting an infection. Call your care team for advice if you get a fever, chills, sore throat, or other symptoms of a cold or flu. Do not treat yourself. Try to avoid being around people who are sick. Avoid taking medications that contain aspirin, acetaminophen, ibuprofen, naproxen, or ketoprofen unless instructed by your care team. These medications can hide a fever. Talk to your care team if you may be pregnant. Serious birth defects can occur if you take this medication during pregnancy and for 7 months after the last dose. You will need a negative pregnancy test  before starting this medication. Contraception is recommended while taking this medication and for 7 months after the last dose. Your care team can help you find the option that works for you. Do not breastfeed while taking this medication and for 7 months after stopping treatment. What side effects may I notice from receiving this medication? Side effects that you should report to your care team as soon as possible: Allergic reactions or angioedema--skin rash, itching or hives, swelling of the face, eyes, lips, tongue, arms, or legs, trouble swallowing or breathing Dry cough, shortness of breath or trouble breathing Heart failure--shortness of breath, swelling of the ankles, feet, or hands, sudden weight gain, unusual weakness or fatigue Infection--fever, chills, cough, or sore throat Infusion reactions--chest pain, shortness of breath or trouble breathing, feeling faint or lightheaded Side effects that usually do not require medical attention (report to your care team if they continue or are bothersome): Diarrhea Dizziness Headache Nausea Trouble sleeping Vomiting This list may not describe all possible side effects. Call your doctor for medical advice about side effects. You may  report side effects to FDA at 1-800-FDA-1088. Where should I keep my medication? This medication is given in a hospital or clinic. It will not be stored at home. NOTE: This sheet is a summary. It may not cover all possible information. If you have questions about this medicine, talk to your doctor, pharmacist, or health care provider.  2023 Elsevier/Gold Standard (2021-12-12 00:00:00)

## 2022-05-22 ENCOUNTER — Ambulatory Visit
Admission: RE | Admit: 2022-05-22 | Discharge: 2022-05-22 | Disposition: A | Discharge status: Home / Self Care | Payer: BC Managed Care – PPO | Source: Ambulatory Visit | Attending: Hematology and Oncology | Admitting: Hematology and Oncology

## 2022-05-22 DIAGNOSIS — C7951 Secondary malignant neoplasm of bone: Secondary | ICD-10-CM

## 2022-05-22 HISTORY — PX: IR RADIOLOGIST EVAL & MGMT: IMG5224

## 2022-05-22 NOTE — H&P (Signed)
Interventional Radiology - Clinic Visit, Initial H&P    Referring Provider: [name], PA-C  Reason for Visit: Pathologic compression fractures at T12, L1    History of Present Illness  Anna [name] is a 51 y.o. female with a relevant past medical history of metastatic breast cancer seen today in Interventional Radiology clinic for new compression fractures at T12, L1.  Patient reports a history of breast lumpectomy in 2014, with recurrence of her breast cancer in November 2022 that started with evaluation of her left hip pain.  PET-CT in December 2022 demonstrated diffuse metastatic disease, and the patient underwent external beam radiation therapy from January to March 2023 for treatment of her sternum, T-spine, and left hip.  She had worsening lower back pain over the course of the severe, and CT chest abdomen pelvis on May 10, 2022 demonstrated new pathologic compression fractures involving T12 and L1 with approximately 50% height loss.    She reports at least moderate pain that she rates 6/10, with only mild improvement to 4/10 with prescription pain medication.  She reports moderate to severe disability on the [employer] disability questionnaire with 17/24 positive, citing difficulties with movement and sleep, including her ADLs such as dressing herself.    Additional Past Medical History Past Medical History:  Diagnosis Date   Achilles tendinitis of left lower extremity 04/22/2018   Acute peptic ulcer without hemorrhage and without perforation 11/29/2020   Acute sinusitis 11/29/2020   Bipolar disorder ([place]) 11/29/2020   Breast cancer ([facility])    Cancer ([place])    Cardiac murmur    Chest pain    Chronic fatigue syndrome 11/29/2020   Depression    Essential hypertension 11/29/2020   Hepatic steatosis determined by biopsy of liver 07/04/2020   HLD (hyperlipidemia)    Hypertension    Hypothyroidism 11/29/2020   Insomnia 11/29/2020   Malignant neoplasm of upper-outer  quadrant of left female breast ([facility]) 07/23/2013   Migraine 11/29/2020   Mild intermittent asthma 11/29/2020   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   Mixed hyperlipidemia 11/29/2020   Obesity due to excess calories    Osteopenia after menopause 07/04/2020   Other long term (current) drug therapy 11/29/2020   Other vitamin B12 deficiency anemias 11/29/2020   Personal history of malignant neoplasm of breast 11/29/2020   Pre-diabetes    Renal cyst, acquired, right 07/04/2020   14.5 cm in October 2021   Retrocalcaneal bursitis (back of heel), left 04/22/2018   Seasonal allergic rhinitis 11/29/2020   Tightness of heel cord, left 04/22/2018   Type 2 diabetes mellitus without complications ([place]) 5/62/1308   Vitamin D deficiency 11/29/2020     Surgical History  Past Surgical History:  Procedure Laterality Date   CESAREAN SECTION     LIVER BIOPSY  01/2019   lumpectomy  2014   Left Breast   PORT-A-CATH REMOVAL     PORTACATH PLACEMENT  08/28/2013   STOMACH SURGERY     Tummy Tuck   TOTAL HIP ARTHROPLASTY Right 07/04/2021   Procedure: TOTAL HIP ARTHROPLASTY;  Surgeon: Paralee Cancel, MD;  Location: [facility];  Service: Orthopedics;  Laterality: Right;  90   WISDOM TOOTH EXTRACTION       Medications  I have reviewed the current medication list. Refer to chart for details. Current Outpatient Medications  Medication Instructions   albuterol (VENTOLIN HFA) 108 (90 Base) MCG/ACT inhaler 2 puffs, Inhalation, Every 6 hours PRN   ARIPiprazole (ABILIFY) 2.5 mg, Oral, Daily   aspirin  EC 81 mg, Oral, Daily, Swallow whole.   atorvastatin (LIPITOR) 20 mg, Oral, Daily   augmented betamethasone dipropionate (DIPROLENE-AF) 0.05 % cream betamethasone, augmented 0.05 % topical cream   calcium carbonate (OS-CAL) 1250 (500 Ca) MG chewable tablet 6 tablets, Oral, Daily   CLENPIQ 10-3.5-12 MG-GM -GM/160ML SOLN No dose, route, or frequency recorded.   desvenlafaxine (PRISTIQ) 50 mg, Oral, Daily    diphenoxylate-atropine (LOMOTIL) 2.5-0.025 MG tablet TAKE TWO TABLETS BY MOUTH FOUR TIMES A DAY AS NEEDED FOR DIARRHEA OR LOOSE STOOLS   Farxiga 10 mg, Oral, Daily   furosemide (LASIX) 20 mg, Oral, Daily   labetalol (NORMODYNE) 200 mg, Oral, 2 times daily   Loratadine 10 mg, Oral, Daily   LORazepam (ATIVAN) 1 mg, Oral, Every 8 hours   magic mouthwash (lidocaine, diphenhydrAMINE, alum & mag hydroxide) suspension 5 mLs, Every  3 hours PRN   naloxone (NARCAN) nasal spray 4 mg/0.1 mL One spray in nostril as needed, may repeat every 2 to 3 minutes until medical assistance becomes available   nitroGLYCERIN (NITROSTAT) 0.4 mg, Sublingual, Every 5 min PRN   omeprazole (PRILOSEC) 40 MG capsule No dose, route, or frequency recorded.   ondansetron (ZOFRAN) 4 mg, Oral, Every 4 hours PRN   oxyCODONE ER (XTAMPZA ER) 9 MG C12A 2 tablets, Oral, 2 times daily   Oxycodone HCl 10 mg, Oral, Every 4 hours PRN   OZEMPIC, 0.25 OR 0.5 MG/DOSE, 2 MG/3ML SOPN Subcutaneous   potassium chloride SA (KLOR-CON M) 20 MEQ tablet 20 mEq, Oral, 2 times daily   prochlorperazine (COMPAZINE) 10 mg, Oral, Every 6 hours PRN   topiramate (TOPAMAX) 25 MG tablet Alternate with 50 mg daily for 1 week, then take 25 mg daily for 1 week, then take 25 mg every other day for 1 week then stop   topiramate (TOPAMAX) 50 mg, Oral, Daily   traMADol (ULTRAM) 50 MG tablet No dose, route, or frequency recorded.   valsartan (DIOVAN) 40 mg, Oral, Daily   Vitamin D (Ergocalciferol) (DRISDOL) 50,000 Units, Oral, Weekly      Allergies No Known Allergies Does patient have contrast allergy: No     Physical Exam Current Vitals Temp: 98.5 F (36.9 C) (Temp Source: Oral)  Pulse Rate: 90  Resp: 14  BP: 106/72  SpO2: 97 %        There is no height or weight on file to calculate BMI.  General: Alert and answers questions appropriately. No apparent distress. HEENT: Normocephalic, atraumatic. Conjunctivae normal without scleral  icterus. Cardiac: Regular rate. No dependent edema. Pulmonary: Normal work of breathing. On room air. Back: Lower back pain over thoracolumbar junction to palpation, greater than upper T spine    Pertinent Lab Results    Latest Ref Rng & Units 05/16/2022   12:00 AM 04/24/2022   12:00 AM 04/04/2022   12:00 AM  CBC  WBC  5.4     5.4     6.1      Hemoglobin 12.0 - 16.0 11.4     11.6     11.7      Hematocrit 36 - 46 36     37     38      Platelets 150 - 400 K/uL 335     279     237         This result is from an external source.      Latest Ref Rng & Units 05/16/2022   12:00 AM 04/24/2022   12:00  AM 04/04/2022   12:00 AM  CMP  BUN 4 - 21 16     15     14       Creatinine 0.5 - 1.1 0.7     0.6     0.6      Sodium 137 - 147 142     140     141      Potassium 3.5 - 5.1 mEq/L 4.2     3.7     3.7      Chloride 99 - 108 107     105     106      CO2 13 - 22 27     31     29       Calcium 8.7 - 10.7 9.2     9.0     9.4      Alkaline Phos 25 - 125 193     109     89      AST 13 - 35 38     69     23      ALT 7 - 35 U/L 44     20     16         This result is from an external source.      Relevant and/or Recent Imaging: As reviewed in HPI   Assessment & Plan Anna [name] is a 51 y.o. female with a history of metastatic breast cancer who was referred to [facility] by Rosanne Sack in consultation for further evaluation and management of her osseous metastatic disease with lower back pain and new symptomatic T12, L1 compression fractures.  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   Has previous fractures of T3, T4 that were treated earlier this year with external beam radiation therapy, with some persistent upper back pain.   History and exam have demonstrated the following:  Acute/Subacute fracture by imaging dated 05/10/2022, Pain on exam concordant with level of fracture, Failure of conservative therapy and  pain refractory to narcotic pain mediation, and Significant disability on the [place] with 17/24 positive symptoms, reflecting significant impact/impairment of (ADLs)   ICD-10-CM Codes that Support Medical Necessity [email or web address])  C79.51    Secondary malignant neoplasm of bone M84.58XA    Pathological fracture in neoplastic disease, other specified site, initial encounter for fracture   Plan:  T12, L1 OsteoCool (RFA + KP)  Post-procedure disposition: outpatient DRI-A  Medication holds: Asprin x3-5 days   The patient has suffered a fracture of the T12, L1 vertebral body. It is recommended that patients aged 89 years or older be evaluated for possible testing or treatment of osteoporosis. A copy of this consult report is sent to the patient's referring physician.  Advanced Care Plan: The patient did not want to provide an [place] at the time of this visit  Depending on treatment course and outcomes from T12, L1 OsteoCool, may discuss with patient T3, T4 OsteoCool for symptomatic pathologic fractures. Note that these fractures have been previously treated with external beam therapy.     Total time spent on today's visit was over 34 Minutes, including both face-to-face time and non face-to-face time, personally spent on review of chart (including labs and relevant imaging), discussing further workup and treatment options, referral to specialist if needed, reviewing outside records if pertinent, answering patient questions, and coordinating care regarding spine compression  fractures as well as management strategy.         Albin Felling, MD  Vascular and Interventional Radiology 05/22/2022 1:53 PM

## 2022-05-23 ENCOUNTER — Telehealth: Payer: Self-pay | Admitting: Dietician

## 2022-05-23 LAB — MAGNESIUM: Magnesium: 2.2

## 2022-05-23 NOTE — Telephone Encounter (Signed)
Called patient at home number in response to request concerning upcoming appointment on 10/17.  [place] on voice mail, also sent text with contact information.  April Manson, RDN, LDN Registered Dietitian, [place] Part Time Remote (Usual office hours: Tuesday-Thursday) Mobile: [phone] Remote Office: [phone]

## 2022-05-24 ENCOUNTER — Ambulatory Visit: Payer: BC Managed Care – PPO | Admitting: Dietician

## 2022-05-24 NOTE — Progress Notes (Signed)
Nutrition Assessment: Reached out to patient at home telephone number.    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  She is having interventional radiology perform kyphoplasty for compression fracture on Tuesday. She has PMHx that included Chemo radiation for her BCA, DM2, Microcytic Anemia, Asthma, NASH, Osteopenia.  She was on Tamoxifen then switched to Anastrozole.  She is currently receiving trastuzumab/pertuzumab every 3 weeks, and is followed by Dr. Darliss Ridgel.  Her NIS are positive for: Nausea, takes Zofran and is controlled, GERD (rare), Swallowing Concern (some times feels like foods stick), Abdominal ache and bloating 3-4 times week past couple months, Bowels (treatment related week after chemo has diarrhea 2-3 times/day) Taste changes (some taste strange like mac & cheese, red meats, and sometimes chicken) sometimes spiciness is enhanced, Overall appetite decreased (also has been taking Ozempic for DM2)  Nutrition Focused Physical Exam: unable to perform NFPE   Medications: Vit D, Calcium, Zofran, Lomotil, Stool softeners, laxative PRN, Prilosec PRN   Labs: reviewed   Anthropometrics: reports she has lost 90# past year, like current weight doesn't want to lose more  Height: 59" Weight:  05/18/22  131.4# 04/24/22   134# 04/06/22  140# 03/14/22   138.5#  BMI: 26.54   Estimated Energy Needs  Kcals: 1800 Protein: 72-90 g Fluid: 2 L   NUTRITION DIAGNOSIS: Inadequate PO intake overall r/t cancer and treatment side effects AEB reported decreased PO and significant weight loss  INTERVENTION:   Educated on importance of adequate calorie and protein energy intake  with nutrient dense foods when possible to maintain weight/strength Discussed ways to add calories/protein to foods (adding healthy fats, avocado)  Encouraged higher fiber foods for constipation  Encouraged increasing  fluids to goal 60-70 oz suggested hot herbal teas to aid with constipation   Emailed Nutrition Tip sheet  for  Taste Changes, Constipation Contact information provided  MONITORING, EVALUATION, GOAL: weight trends, nutrition impact symptoms, PO intake, labs   Next Visit: PRN at patient or provider request  April Manson, RDN, LDN Registered Dietitian, [place] Part Time Remote (Usual office hours: Tuesday-Thursday) Mobile: [phone] Remote Office: [phone]

## 2022-05-25 ENCOUNTER — Other Ambulatory Visit: Payer: Self-pay

## 2022-05-25 DIAGNOSIS — M545 Low back pain, unspecified: Secondary | ICD-10-CM

## 2022-05-25 DIAGNOSIS — R0789 Other chest pain: Secondary | ICD-10-CM

## 2022-05-25 DIAGNOSIS — C7951 Secondary malignant neoplasm of bone: Secondary | ICD-10-CM

## 2022-05-25 MED ORDER — OXYCODONE HCL 10 MG PO TABS: 10.0000 mg | ORAL_TABLET | ORAL | 0 refills | Status: DC | PRN

## 2022-05-28 ENCOUNTER — Other Ambulatory Visit: Payer: Self-pay | Admitting: Hematology and Oncology

## 2022-05-28 ENCOUNTER — Encounter: Payer: Self-pay | Admitting: Oncology

## 2022-05-28 NOTE — Discharge Instructions (Signed)
Kyphoplasty Post Procedure Discharge Instructions  May resume a regular diet and any medications that you routinely take (including pain medications). However, if you are taking Aspirin or an anticoagulant/blood thinner you will be told when you can resume taking these by the healthcare provider. No driving day of procedure. The day of your procedure take it easy. You may use an ice pack as needed to injection sites on back.  Ice to back 30 minutes on and 30 minutes off, as needed. May remove bandaids tomorrow after taking a shower. Replace daily with a clean bandaid until healed.  Do not lift anything heavier than a milk jug for 1-2 weeks or determined by your physician.  Follow up with your physician in 2 weeks.    Please contact our office at [phone] for the following symptoms or if you have any questions:  Fever greater than 100 degrees Increased swelling, pain, or redness at injection site. Increased back and/or leg pain New numbness or change in symptoms from before the procedure.    Thank you for visiting [facility].   You may restart you Aspirin today.

## 2022-05-29 ENCOUNTER — Other Ambulatory Visit: Payer: Self-pay | Admitting: Interventional Radiology

## 2022-05-29 ENCOUNTER — Other Ambulatory Visit: Payer: Self-pay | Admitting: Hematology and Oncology

## 2022-05-29 ENCOUNTER — Ambulatory Visit
Admission: RE | Admit: 2022-05-29 | Discharge: 2022-05-29 | Disposition: A | Discharge status: Home / Self Care | Payer: BC Managed Care – PPO | Source: Ambulatory Visit | Attending: Hematology and Oncology | Admitting: Hematology and Oncology

## 2022-05-29 ENCOUNTER — Encounter: Payer: BC Managed Care – PPO | Admitting: Dietician

## 2022-05-29 DIAGNOSIS — C7951 Secondary malignant neoplasm of bone: Secondary | ICD-10-CM

## 2022-05-29 DIAGNOSIS — Z712 Person consulting for explanation of examination or test findings: Secondary | ICD-10-CM

## 2022-05-29 HISTORY — PX: IR BONE TUMOR(S)RF ABLATION: IMG2284

## 2022-05-29 HISTORY — PX: IR KYPHO THORACIC WITH BONE BIOPSY: IMG5518

## 2022-05-29 HISTORY — PX: IR KYPHO EA ADDL LEVEL THORACIC OR LUMBAR: IMG5520

## 2022-05-29 MED ORDER — SODIUM CHLORIDE 0.9 % IV SOLN: INTRAVENOUS | Status: DC

## 2022-05-29 MED ORDER — KETOROLAC TROMETHAMINE 30 MG/ML IJ SOLN
30.0000 mg | Freq: Once | INTRAMUSCULAR | Status: AC
  Administered 2022-05-29: 30 mg via INTRAVENOUS

## 2022-05-29 MED ORDER — CEFAZOLIN SODIUM-DEXTROSE 2-4 GM/100ML-% IV SOLN
2.0000 g | INTRAVENOUS | Status: AC
  Administered 2022-05-29: 2 g via INTRAVENOUS

## 2022-05-29 MED ORDER — FENTANYL CITRATE PF 50 MCG/ML IJ SOSY
25.0000 ug | PREFILLED_SYRINGE | INTRAMUSCULAR | Status: DC | PRN
  Administered 2022-05-29 (×3): 50 ug via INTRAVENOUS

## 2022-05-29 MED ORDER — MIDAZOLAM HCL 2 MG/2ML IJ SOLN
1.0000 mg | INTRAMUSCULAR | Status: DC | PRN
  Administered 2022-05-29 (×6): 1 mg via INTRAVENOUS

## 2022-05-29 NOTE — Progress Notes (Signed)
Pt back in nursing recovery area. Pt still drowsy from procedure but will wake up when spoken to. Pt follows commands, talks in complete sentences and has no complaints at this time. Pt will remain in nursing station until discharge.

## 2022-05-30 ENCOUNTER — Other Ambulatory Visit: Payer: Self-pay

## 2022-06-04 ENCOUNTER — Telehealth: Payer: Self-pay

## 2022-06-04 NOTE — Telephone Encounter (Signed)
Left message on pt. Phone with phone number for pt. To call back.

## 2022-06-05 ENCOUNTER — Inpatient Hospital Stay: Payer: BC Managed Care – PPO

## 2022-06-05 ENCOUNTER — Inpatient Hospital Stay (INDEPENDENT_AMBULATORY_CARE_PROVIDER_SITE_OTHER): Payer: BC Managed Care – PPO | Admitting: Hematology and Oncology

## 2022-06-05 ENCOUNTER — Encounter: Payer: Self-pay | Admitting: Hematology and Oncology

## 2022-06-05 ENCOUNTER — Other Ambulatory Visit: Payer: Self-pay

## 2022-06-05 ENCOUNTER — Telehealth: Payer: Self-pay

## 2022-06-05 VITALS — BP 111/68 | HR 100 | Temp 98.2°F | Resp 18 | Ht 59.0 in | Wt 130.9 lb

## 2022-06-05 DIAGNOSIS — C7951 Secondary malignant neoplasm of bone: Secondary | ICD-10-CM | POA: Diagnosis not present

## 2022-06-05 DIAGNOSIS — D509 Iron deficiency anemia, unspecified: Secondary | ICD-10-CM | POA: Diagnosis not present

## 2022-06-05 DIAGNOSIS — E139 Other specified diabetes mellitus without complications: Secondary | ICD-10-CM

## 2022-06-05 DIAGNOSIS — Z170 Estrogen receptor positive status [ER+]: Secondary | ICD-10-CM

## 2022-06-05 DIAGNOSIS — Z5111 Encounter for antineoplastic chemotherapy: Secondary | ICD-10-CM | POA: Diagnosis not present

## 2022-06-05 DIAGNOSIS — C50412 Malignant neoplasm of upper-outer quadrant of left female breast: Secondary | ICD-10-CM

## 2022-06-05 DIAGNOSIS — K760 Fatty (change of) liver, not elsewhere classified: Secondary | ICD-10-CM

## 2022-06-05 HISTORY — DX: Iron deficiency anemia, unspecified: D50.9

## 2022-06-05 LAB — HEMOGLOBIN A1C
Hgb A1c MFr Bld: 5.9 % — ABNORMAL HIGH (ref 4.8–5.6)
Mean Plasma Glucose: 122.63 mg/dL

## 2022-06-05 LAB — CBC: RBC: 4.4 (ref 3.87–5.11)

## 2022-06-05 LAB — CBC AND DIFFERENTIAL
HCT: 34 — AB (ref 36–46)
Hemoglobin: 10.8 — AB (ref 12.0–16.0)
MCV: 77 — AB (ref 81–99)
Neutrophils Absolute: 6.69
Platelets: 373 10*3/uL (ref 150–400)
WBC: 8.8

## 2022-06-05 LAB — HEPATIC FUNCTION PANEL
ALT: 74 U/L — AB (ref 7–35)
AST: 78 — AB (ref 13–35)
Alkaline Phosphatase: 264 — AB (ref 25–125)
Bilirubin, Total: 0.5

## 2022-06-05 LAB — COMPREHENSIVE METABOLIC PANEL
Albumin: 3.9 (ref 3.5–5.0)
Calcium: 9.3 (ref 8.7–10.7)

## 2022-06-05 LAB — BASIC METABOLIC PANEL
BUN: 18 (ref 4–21)
CO2: 27 — AB (ref 13–22)
Chloride: 106 (ref 99–108)
Creatinine: 0.5 (ref 0.5–1.1)
Glucose: 117
Potassium: 3.8 mEq/L (ref 3.5–5.1)
Sodium: 138 (ref 137–147)

## 2022-06-05 LAB — MAGNESIUM: Magnesium: 2.2

## 2022-06-05 NOTE — Progress Notes (Signed)
Anna [name]  [address] [place],  [place]  [address] [phone]  Clinic Day:  06/06/2022  Referring physician: Bonnita Nasuti, MD  ASSESSMENT & PLAN:   Assessment & Plan: Malignant neoplasm metastatic to bone Anna [name]) Bone metastases from hormone receptor positive breast cancer diagnosed in November 2022.  There were multiple marrow replacing lesions within the proximal right femur within the ischium, as well as the inferior rami. 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  Estrogen and progesterone receptors were once again positive.  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 Ki67 was 60%. PET revealed dominant finding of intensely hypermetabolic aggressive skeletal metastasis involving the calvarium, sternum, thoracic and lumbar spine, and pelvis, with potential pathologic fracture of the right femur with internal fixation. There was soft tissue extension into the anterior mediastinum associated with the manubrial metastasis. Multiple spinal vertebral body lesions have cortical destruction along the posterior margin. She started denosumab monthly in December.    She received radiation therapy to the right hip, left sacrum/SI joint and upper T-spine/sternum in January.  She was then placed on chemotherapy with docetaxel/trastuzumab/pertuzumab and completed 8 cycles.  She continues trastuzumab/pertuzumab as maintenance.    In September, she had worsening left shoulder pain.  Plain films did not reveal any acute osseous abnormality.  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  As she was using oxycodone 10 mg every 4 hours around-the-clock, I recommended extended release oxycodone.  She had tried extended release morphine sulfate prior to this and did not tolerate it because of difficulty breathing.  Extended release oxycodone was not covered by her insurance, so I placed her on Xtampza ER  18 mg every 12 hours.  She states she did not tolerate the Xtampza due to sensation of difficulty breathing, so she discontinued this.  However, she states she slept well with the initial dose of Xtampza, but then experienced the side affect with the morning dose.  She continues oxycodone 10 mg every 4 hours around-the-clock.  I recommended that she try Xtampza ER 9 mg every 12 hours to see if she will tolerate this.  CT chest, abdomen and pelvis revealed new compression fractures at T12 with greater than 50% loss of height and at L1 with 40 to 50% loss of height since her PET scan in December 2022.  This is the area of her pain.  She will was referred to interventional radiology and underwent kyphoplasty of T12.  She has not yet been started on hormonal therapy.  I was tapering her Topamax due to elevated transaminases and did not want to cloud the picture with a new medication.  She is down to Topamax 25 mg daily and the transaminases have actually worsened.  CT abdomen in September did not reveal any suspicious hepatic lesion but the patient has known hepatic steatosis.  We will continue to monitor this.  She will proceed with an 12th cycle of trastuzumab/pertuzumab this week.  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  I will discuss her case with Dr. Orlene [name] and see if there is any room for further radiation treatment.  We will plan to see her back in 3 weeks for repeat clinical assessment prior to a 13th cycle.  Microcytic anemia Microcytic anemia.  She has had some bright red blood per rectum, which she attributes to hemorrhoids.  Iron studies were equivocal,  so soluble transferrin was added and was elevated, which is consistent with iron deficiency.  As her hemoglobin is dropping, I will arrange for her to have IV iron in the form of Venofer in the upcoming days.  Hepatic steatosis determined by biopsy of liver Known hepatic steatosis with  recurrent elevation of the transaminases with uncertain etiology.  It is not felt to be associated with the HER2 targeted therapy.  We have been tapering her Topamax to see if that would help, but the transaminases are worsening.  We may need to look at other medication as a cause.  Malignant neoplasm of upper-outer quadrant of left female breast Anna [name]) History of stage IIB hormone receptor positive breast cancer diagnosed in December 2014.  She was treated with surgery, chemotherapy and radiation therapy. She was on adjuvant hormonal therapy with tamoxifen 20 mg daily from October 2015 to May 2020, but due to elevation of the liver transaminases was switched to anastrazole in June 2020.  She now has recurrent disease with bone metastasis.   The patient understands the plans discussed today and is in agreement with them.  She knows to contact our office if she develops concerns prior to her next appointment.      Anna Pickles, PA-C  [name] [address] [place] [address] Dept: [phone] Dept Fax: [phone]   Orders Placed This Encounter  Procedures   Hemoglobin A1c    Standing Status:   Future    Number of Occurrences:   1    Standing Expiration Date:   56/97/9480   Basic metabolic panel    This external order was created through the Results Console.   Comprehensive metabolic panel    This external order was created through the Results Console.   Hepatic function panel    This external order was created through the Results Console.   Magnesium    This order was created through External Result Entry   CBC and differential    This external order was created through the Results Console.   CBC    This external order was created through the Results Console.      CHIEF COMPLAINT:  CC: Recurrent hormone and HER2 positive breast cancer with bone metastases  Current Treatment: Trastuzumab/pertuzumab  every 3 weeks with denosumab every 6 weeks  HISTORY OF PRESENT ILLNESS:   Oncology History  Malignant neoplasm of upper-outer quadrant of left female breast (Anna [name])  07/23/2013 Initial Diagnosis   Malignant neoplasm of upper-outer quadrant of left female breast ([facility])   07/23/2013 Cancer Staging   Staging form: Breast, AJCC 7th Edition - Clinical stage from 07/23/2013: Stage IIB (T2, N1, M0) - Signed by Derwood Kaplan, MD on 07/04/2020 Prognostic indicators: Pos LVI   09/22/2021 - 04/06/2022 Chemotherapy   Patient is on Treatment Plan : BREAST  Trastuzumab + Pertuzumab q21d      09/22/2021 -  Chemotherapy   Patient is on Treatment Plan : BREAST DOCEtaxel + Trastuzumab + Pertuzumab (THP) q21d x 8 cycles / Trastuzumab + Pertuzumab q21d x 4 cycles     Malignant neoplasm metastatic to bone ([facility])  06/28/2021 Initial Diagnosis   Bone metastases ([facility])   09/22/2021 - 04/06/2022 Chemotherapy   Patient is on Treatment Plan : BREAST  Trastuzumab + Pertuzumab q21d      09/22/2021 -  Chemotherapy   Patient is on Treatment Plan : BREAST DOCEtaxel + Trastuzumab + Pertuzumab (THP) q21d x  8 cycles / Trastuzumab + Pertuzumab q21d x 4 cycles     05/11/2022 Imaging   CT chest, abdomen and pelvis:  IMPRESSION:  1. Widespread bony metastatic disease shows increased sclerosis  since previous imaging. This is likely related to response to  therapy in the interval. There is a single lesion along the LEFT  iliac which shows continued lytic change and warrants attention on  follow-up  2. Interval development of pathologic fractures at in the upper  thoracic spine and at the thoracolumbar junction with associated  kyphosis.  3. No signs of solid organ or nodal metastatic disease.  4. Marked elevation of the LEFT hemidiaphragm as on the prior PET.  5. Aortic atherosclerosis.        INTERVAL HISTORY:  Anna [name] is here today for repeat clinical assessment.  She states she continues to tolerate  trastuzumab/pertuzumab without significant difficulty.  She states her lower back pain is less intense, but still constant.  She has had Oseocool/radiofrequency ablation/kyphoplasty of T12 and L1.  She reports increased pain in the left shoulder.  She states she did not tolerate even Xtampza 9 mg daily, so continues oxycodone 10 mg every 4 hours around-the-clock.   She denies fevers or chills.  Her appetite is good. Her weight has been stable.    REVIEW OF SYSTEMS:  Review of Systems  Constitutional:  Positive for appetite change and fatigue. Negative for chills, fever and unexpected weight change.  HENT:   Negative for lump/mass, mouth sores and sore throat.   Respiratory:  Negative for cough and shortness of breath.   Cardiovascular:  Negative for chest pain and leg swelling.  Gastrointestinal:  Negative for abdominal pain, constipation, diarrhea, nausea and vomiting.  Endocrine: Negative for hot flashes.  Genitourinary:  Negative for difficulty urinating, dysuria, frequency and hematuria.   Musculoskeletal:  Positive for arthralgias and back pain. Negative for myalgias.  Skin:  Positive for wound (Blister on her back due to adhesive tape turned black). Negative for rash.  Neurological:  Negative for dizziness and headaches.  Hematological:  Negative for adenopathy. Does not bruise/bleed easily.  Psychiatric/Behavioral:  Negative for depression and sleep disturbance. The patient is nervous/anxious.      VITALS:  Blood pressure 111/68, pulse 100, temperature 98.2 F (36.8 C), temperature source Oral, resp. rate 18, height 4' 11"  (1.499 m), weight 130 lb 14.4 oz (59.4 kg), last menstrual period 08/18/2013, SpO2 95 %.  Wt Readings from Last 3 Encounters:  06/05/22 130 lb 14.4 oz (59.4 kg)  05/18/22 131 lb 6.4 oz (59.6 kg)  05/16/22 130 lb 11.2 oz (59.3 kg)    Body mass index is 26.44 kg/m.  Performance status (ECOG): 2 - Symptomatic, <50% confined to bed  PHYSICAL EXAM:  Physical  Exam Vitals and nursing note reviewed.  Constitutional:      General: She is not in acute distress.    Appearance: Normal appearance.  HENT:     Head: Normocephalic and atraumatic.     Mouth/Throat:     Mouth: Mucous membranes are moist.     Pharynx: Oropharynx is clear. No oropharyngeal exudate or posterior oropharyngeal erythema.  Eyes:     General: No scleral icterus.    Extraocular Movements: Extraocular movements intact.     Conjunctiva/sclera: Conjunctivae normal.     Pupils: Pupils are equal, round, and reactive to light.  Cardiovascular:     Rate and Rhythm: Normal rate and regular rhythm.     Heart sounds: Normal heart sounds.  No murmur heard.    No friction rub. No gallop.  Pulmonary:     Effort: Pulmonary effort is normal.     Breath sounds: Normal breath sounds. No wheezing, rhonchi or rales.  Chest:     Comments: Breast exam is deferred Abdominal:     General: There is no distension.     Palpations: Abdomen is soft. There is no hepatomegaly, splenomegaly or mass.     Tenderness: There is no abdominal tenderness.  Musculoskeletal:        General: Normal range of motion.     Cervical back: Normal range of motion and neck supple. No tenderness.     Right lower leg: No edema.     Left lower leg: No edema.  Lymphadenopathy:     Cervical: No cervical adenopathy.     Upper Body:     Right upper body: No supraclavicular or axillary adenopathy.     Left upper body: No supraclavicular or axillary adenopathy.     Lower Body: No right inguinal adenopathy. No left inguinal adenopathy.  Skin:    General: Skin is warm and dry.     Coloration: Skin is not jaundiced.     Findings: Wound (Healing blister of the right mid back with overlying eschar) present. No rash.     Comments: There is several small areas of desquamation from the adhesive tape, which are healing  Neurological:     Mental Status: She is alert and oriented to person, place, and time.     Cranial Nerves: No  cranial nerve deficit.  Psychiatric:        Mood and Affect: Mood normal.        Behavior: Behavior normal.        Thought Content: Thought content normal.     LABS:      Latest Ref Rng & Units 06/05/2022   12:00 AM 05/16/2022   12:00 AM 04/24/2022   12:00 AM  CBC  WBC  8.8     5.4     5.4      Hemoglobin 12.0 - 16.0 10.8     11.4     11.6      Hematocrit 36 - 46 34     36     37      Platelets 150 - 400 K/uL 373     335     279         This result is from an external source.      Latest Ref Rng & Units 06/05/2022   12:00 AM 05/16/2022   12:00 AM 04/24/2022   12:00 AM  CMP  BUN 4 - 21 18     16     15       Creatinine 0.5 - 1.1 0.5     0.7     0.6      Sodium 137 - 147 138     142     140      Potassium 3.5 - 5.1 mEq/L 3.8     4.2     3.7      Chloride 99 - 108 106     107     105      CO2 13 - 22 27     27     31       Calcium 8.7 - 10.7 9.3     9.2     9.0      Alkaline Phos 25 -  125 264     193     109      AST 13 - 35 78     38     69      ALT 7 - 35 U/L 74     44     20         This result is from an external source.     Lab Results  Component Value Date   CEA1 2.2 07/20/2021   /  CEA  Date Value Ref Range Status  07/20/2021 2.2 0.0 - 4.7 ng/mL Final    Comment:    (NOTE)                             Nonsmokers          <3.9                             Smokers             <5.6 [employer] Electrochemiluminescence Immunoassay (ECLIA) Values obtained with different assay methods or kits cannot be used interchangeably.  Results cannot be interpreted as absolute evidence of the presence or absence of malignant disease. Performed At: [facility] [place], [place] [id number] Rush Farmer MD GL:[id number]    No results found for: "PSA1" No results found for: "[id number]" No results found for: "CAN125"  No results found for: "TOTALPROTELP", "ALBUMINELP", "A1GS", "A2GS", "BETS", "BETA2SER", "GAMS", "MSPIKE", "SPEI" Lab Results   Component Value Date   TIBC 287 04/24/2022   TIBC 279 10/13/2021   FERRITIN 119 04/24/2022   FERRITIN 206 10/13/2021   IRONPCTSAT 9 (L) 04/24/2022   IRONPCTSAT 13 10/13/2021   No results found for: "LDH"  STUDIES:  IR KYPHO EA ADDL LEVEL THORACIC OR LUMBAR  Result Date: 05/29/2022 INDICATION: Metastatic breast cancer, new fractures involving T12 and L1 with lower back pain EXAM: 1. T12 vertebral body radiofrequency ablation using fluoroscopic guidance (OsteoCool) 2. T12 vertebral body augmentation using balloon kyphoplasty 3. L1 vertebral body radiofrequency ablation using fluoroscopic guidance (OsteoCool) 4. L1 vertebral body augmentation using balloon kyphoplasty COMPARISON:  None Available. MEDICATIONS: Refer to EMR ANESTHESIA/SEDATION: Moderate (conscious) sedation was employed during this procedure. A total of Versed 6 mg and Fentanyl 150 mcg was administered intravenously by the radiology nurse. Total intra-service moderate Sedation Time: 74 minutes. The patient's level of consciousness and vital signs were monitored continuously by radiology nursing throughout the procedure under my direct supervision. FLUOROSCOPY: Radiation Exposure Index (as provided by the fluoroscopic device): 11.5 minutes (77 mGy) COMPLICATIONS: None immediate. PROCEDURE: Informed written consent was obtained from the patient after a thorough discussion of the procedural risks, benefits and alternatives. All questions were addressed. Maximal Sterile Barrier Technique was utilized including caps, mask, sterile gowns, sterile gloves, sterile drape, hand hygiene and skin antiseptic. A timeout was performed prior to the initiation of the procedure. The patient was positioned prone on the exam table. A bipedicular access was planned. Skin entry site(s) were marked overlying the T12 vertebral body using fluoroscopy. The overlying skin was then prepped and draped in the standard sterile fashion. Local analgesia was obtained with  1% lidocaine. Attention was first turned to the right side. Under fluoroscopic guidance, a 10 gauge introducer needle was advanced towards the lateral margin of the pedicle. Using multiple projections, the introducer needle was advanced towards the posterior margin  of the vertebral body via a transpedicular approach. The posterior margin of the ablation zone was then marked using the inner needle of the introducer. Next, the marking drill was advanced towards the anterior margin of the vertebral body for RF probe selection. The 15 mm RF ablation probe was determined to be appropriate. Attention was then turned to the contralateral side. Under fluoroscopic guidance, a 10 gauge introducer needle was advanced towards the lateral margin of the pedicle. Using multiple projections, the introducer needle was advanced towards the posterior margin of the vertebral body via a transpedicular approach. Again, the posterior margin of the ablation zone was then marked using the inner needle of the introducer. In a similar fashion, core needle biopsy of the vertebral body was performed using a 10 gauge core biopsy device. Small amount of tissue was again retrieved and sent to the lab for analysis. The marking drill was then advanced towards the anterior margin of the vertebral body for RF probe selection. The 15 mm RF ablation probe was determined to be appropriate. The selected RF ablation probes were then advanced through the bilateral transpedicular access needles and locked into position. Appropriate location within the vertebral body was confirmed with multiple projections. RF ablation was then performed for 15 minutes at 70 degrees Celsius. The patient tolerated this portion of the procedure well without significant discomfort. Attention was then turned to L1. A bipedicular access was planned. Skin entry site(s) were marked overlying the L1 vertebral body using fluoroscopy. The overlying skin was then prepped and draped in  the standard sterile fashion. Local analgesia was obtained with 1% lidocaine. Attention was first turned to the right side. Under fluoroscopic guidance, a 10 gauge introducer needle was advanced towards the lateral margin of the pedicle. Using multiple projections, the introducer needle was advanced towards the posterior margin of the vertebral body via a transpedicular approach. The posterior margin of the ablation zone was then marked using the inner needle of the introducer. Next, the marking drill was advanced towards the anterior margin of the vertebral body for RF probe selection. The 15 mm RF ablation probe was determined to be appropriate. Attention was then turned to the contralateral side. Under fluoroscopic guidance, a 10 gauge introducer needle was advanced towards the lateral margin of the pedicle. Using multiple projections, the introducer needle was advanced towards the posterior margin of the vertebral body via a transpedicular approach. Again, the posterior margin of the ablation zone was then marked using the inner needle of the introducer. In a similar fashion, core needle biopsy of the vertebral body was performed using a 10 gauge core biopsy device. Small amount of tissue was again retrieved and sent to the lab for analysis. The marking drill was then advanced towards the anterior margin of the vertebral body for RF probe selection. The 15 mm RF ablation probe was determined to be appropriate. The selected RF ablation probes were then advanced through the bilateral transpedicular access needles and locked into position. Appropriate location within the vertebral body was confirmed with multiple projections. RF ablation was then performed for 15 minutes at 70 degrees Celsius. The patient tolerated this portion of the procedure well without significant discomfort. At this time, the acrylic bone cement mixture was reconstituted in the Kyphon bone mixing device system. This was then loaded onto the  Kyphon bone fillers. Upon completion of the RF ablation, the ablation probes were removed. The Kyphon 15 mm inflatable bone tamps were then advanced through the bilateral transpedicular access  needles and positioned within the mid vertebral body. Kyphoplasty was then performed, ensuring that the balloon contours stayed within the vertebral body margins. The balloons were then deflated and removed, followed by advancement of the bone filler devices bilaterally and the instillation of acrylic bone cement with excellent filling in the AP and lateral projections. A small amount of cement was noted in the T12-L1 disc space, which served as the stopping point for cement instillation. A small amount of cement was seen along the left lateral border of the L1 vertebral body, again serving as the stopping point for cement instillation. These steps were performed at both T12 and L1 to complete balloon kyphoplasty at both levels. At the end of the procedure, the introducer cannulas and bone filler devices were then removed without difficulty. Clean dressings were placed after hemostasis. The patient tolerated all aspects of the procedure well, and was transferred to recovery in stable condition. IMPRESSION: 1. Successful T12 radiofrequency ablation and vertebral body augmentation using balloon kyphoplasty for treatment of symptomatic metastatic breast cancer. 2. Successful L1 radiofrequency ablation and vertebral body augmentation using kyphoplasty for treatment of symptomatic breast cancer. If the patient has known osteoporosis, recommend treatment as clinically indicated. If the patient's bone density status is unknown, DEXA scan is recommended. Electronically Signed   By: Albin Felling M.D.   On: 05/29/2022 13:47   IR Bone Tumor(s)RF Ablation  Result Date: 05/29/2022 INDICATION: Metastatic breast cancer, new fractures involving T12 and L1 with lower back pain EXAM: 1. T12 vertebral body radiofrequency ablation using  fluoroscopic guidance (OsteoCool) 2. T12 vertebral body augmentation using balloon kyphoplasty 3. L1 vertebral body radiofrequency ablation using fluoroscopic guidance (OsteoCool) 4. L1 vertebral body augmentation using balloon kyphoplasty COMPARISON:  None Available. MEDICATIONS: Refer to EMR ANESTHESIA/SEDATION: Moderate (conscious) sedation was employed during this procedure. A total of Versed 6 mg and Fentanyl 150 mcg was administered intravenously by the radiology nurse. Total intra-service moderate Sedation Time: 74 minutes. The patient's level of consciousness and vital signs were monitored continuously by radiology nursing throughout the procedure under my direct supervision. FLUOROSCOPY: Radiation Exposure Index (as provided by the fluoroscopic device): 11.5 minutes (77 mGy) COMPLICATIONS: None immediate. PROCEDURE: Informed written consent was obtained from the patient after a thorough discussion of the procedural risks, benefits and alternatives. All questions were addressed. Maximal Sterile Barrier Technique was utilized including caps, mask, sterile gowns, sterile gloves, sterile drape, hand hygiene and skin antiseptic. A timeout was performed prior to the initiation of the procedure. The patient was positioned prone on the exam table. A bipedicular access was planned. Skin entry site(s) were marked overlying the T12 vertebral body using fluoroscopy. The overlying skin was then prepped and draped in the standard sterile fashion. Local analgesia was obtained with 1% lidocaine. Attention was first turned to the right side. Under fluoroscopic guidance, a 10 gauge introducer needle was advanced towards the lateral margin of the pedicle. Using multiple projections, the introducer needle was advanced towards the posterior margin of the vertebral body via a transpedicular approach. The posterior margin of the ablation zone was then marked using the inner needle of the introducer. Next, the marking drill was  advanced towards the anterior margin of the vertebral body for RF probe selection. The 15 mm RF ablation probe was determined to be appropriate. Attention was then turned to the contralateral side. Under fluoroscopic guidance, a 10 gauge introducer needle was advanced towards the lateral margin of the pedicle. Using multiple projections, the introducer needle was advanced  towards the posterior margin of the vertebral body via a transpedicular approach. Again, the posterior margin of the ablation zone was then marked using the inner needle of the introducer. In a similar fashion, core needle biopsy of the vertebral body was performed using a 10 gauge core biopsy device. Small amount of tissue was again retrieved and sent to the lab for analysis. The marking drill was then advanced towards the anterior margin of the vertebral body for RF probe selection. The 15 mm RF ablation probe was determined to be appropriate. The selected RF ablation probes were then advanced through the bilateral transpedicular access needles and locked into position. Appropriate location within the vertebral body was confirmed with multiple projections. RF ablation was then performed for 15 minutes at 70 degrees Celsius. The patient tolerated this portion of the procedure well without significant discomfort. Attention was then turned to L1. A bipedicular access was planned. Skin entry site(s) were marked overlying the L1 vertebral body using fluoroscopy. The overlying skin was then prepped and draped in the standard sterile fashion. Local analgesia was obtained with 1% lidocaine. Attention was first turned to the right side. Under fluoroscopic guidance, a 10 gauge introducer needle was advanced towards the lateral margin of the pedicle. Using multiple projections, the introducer needle was advanced towards the posterior margin of the vertebral body via a transpedicular approach. The posterior margin of the ablation zone was then marked using  the inner needle of the introducer. Next, the marking drill was advanced towards the anterior margin of the vertebral body for RF probe selection. The 15 mm RF ablation probe was determined to be appropriate. Attention was then turned to the contralateral side. Under fluoroscopic guidance, a 10 gauge introducer needle was advanced towards the lateral margin of the pedicle. Using multiple projections, the introducer needle was advanced towards the posterior margin of the vertebral body via a transpedicular approach. Again, the posterior margin of the ablation zone was then marked using the inner needle of the introducer. In a similar fashion, core needle biopsy of the vertebral body was performed using a 10 gauge core biopsy device. Small amount of tissue was again retrieved and sent to the lab for analysis. The marking drill was then advanced towards the anterior margin of the vertebral body for RF probe selection. The 15 mm RF ablation probe was determined to be appropriate. The selected RF ablation probes were then advanced through the bilateral transpedicular access needles and locked into position. Appropriate location within the vertebral body was confirmed with multiple projections. RF ablation was then performed for 15 minutes at 70 degrees Celsius. The patient tolerated this portion of the procedure well without significant discomfort. At this time, the acrylic bone cement mixture was reconstituted in the Kyphon bone mixing device system. This was then loaded onto the Kyphon bone fillers. Upon completion of the RF ablation, the ablation probes were removed. The Kyphon 15 mm inflatable bone tamps were then advanced through the bilateral transpedicular access needles and positioned within the mid vertebral body. Kyphoplasty was then performed, ensuring that the balloon contours stayed within the vertebral body margins. The balloons were then deflated and removed, followed by advancement of the bone filler  devices bilaterally and the instillation of acrylic bone cement with excellent filling in the AP and lateral projections. A small amount of cement was noted in the T12-L1 disc space, which served as the stopping point for cement instillation. A small amount of cement was seen along the left lateral  border of the L1 vertebral body, again serving as the stopping point for cement instillation. These steps were performed at both T12 and L1 to complete balloon kyphoplasty at both levels. At the end of the procedure, the introducer cannulas and bone filler devices were then removed without difficulty. Clean dressings were placed after hemostasis. The patient tolerated all aspects of the procedure well, and was transferred to recovery in stable condition. IMPRESSION: 1. Successful T12 radiofrequency ablation and vertebral body augmentation using balloon kyphoplasty for treatment of symptomatic metastatic breast cancer. 2. Successful L1 radiofrequency ablation and vertebral body augmentation using kyphoplasty for treatment of symptomatic breast cancer. If the patient has known osteoporosis, recommend treatment as clinically indicated. If the patient's bone density status is unknown, DEXA scan is recommended. Electronically Signed   By: Albin Felling M.D.   On: 05/29/2022 13:47   IR Bone Tumor(s)RF Ablation  Result Date: 05/29/2022 INDICATION: Metastatic breast cancer, new fractures involving T12 and L1 with lower back pain EXAM: 1. T12 vertebral body radiofrequency ablation using fluoroscopic guidance (OsteoCool) 2. T12 vertebral body augmentation using balloon kyphoplasty 3. L1 vertebral body radiofrequency ablation using fluoroscopic guidance (OsteoCool) 4. L1 vertebral body augmentation using balloon kyphoplasty COMPARISON:  None Available. MEDICATIONS: Refer to EMR ANESTHESIA/SEDATION: Moderate (conscious) sedation was employed during this procedure. A total of Versed 6 mg and Fentanyl 150 mcg was administered  intravenously by the radiology nurse. Total intra-service moderate Sedation Time: 74 minutes. The patient's level of consciousness and vital signs were monitored continuously by radiology nursing throughout the procedure under my direct supervision. FLUOROSCOPY: Radiation Exposure Index (as provided by the fluoroscopic device): 11.5 minutes (77 mGy) COMPLICATIONS: None immediate. PROCEDURE: Informed written consent was obtained from the patient after a thorough discussion of the procedural risks, benefits and alternatives. All questions were addressed. Maximal Sterile Barrier Technique was utilized including caps, mask, sterile gowns, sterile gloves, sterile drape, hand hygiene and skin antiseptic. A timeout was performed prior to the initiation of the procedure. The patient was positioned prone on the exam table. A bipedicular access was planned. Skin entry site(s) were marked overlying the T12 vertebral body using fluoroscopy. The overlying skin was then prepped and draped in the standard sterile fashion. Local analgesia was obtained with 1% lidocaine. Attention was first turned to the right side. Under fluoroscopic guidance, a 10 gauge introducer needle was advanced towards the lateral margin of the pedicle. Using multiple projections, the introducer needle was advanced towards the posterior margin of the vertebral body via a transpedicular approach. The posterior margin of the ablation zone was then marked using the inner needle of the introducer. Next, the marking drill was advanced towards the anterior margin of the vertebral body for RF probe selection. The 15 mm RF ablation probe was determined to be appropriate. Attention was then turned to the contralateral side. Under fluoroscopic guidance, a 10 gauge introducer needle was advanced towards the lateral margin of the pedicle. Using multiple projections, the introducer needle was advanced towards the posterior margin of the vertebral body via a  transpedicular approach. Again, the posterior margin of the ablation zone was then marked using the inner needle of the introducer. In a similar fashion, core needle biopsy of the vertebral body was performed using a 10 gauge core biopsy device. Small amount of tissue was again retrieved and sent to the lab for analysis. The marking drill was then advanced towards the anterior margin of the vertebral body for RF probe selection. The 15 mm RF ablation  probe was determined to be appropriate. The selected RF ablation probes were then advanced through the bilateral transpedicular access needles and locked into position. Appropriate location within the vertebral body was confirmed with multiple projections. RF ablation was then performed for 15 minutes at 70 degrees Celsius. The patient tolerated this portion of the procedure well without significant discomfort. Attention was then turned to L1. A bipedicular access was planned. Skin entry site(s) were marked overlying the L1 vertebral body using fluoroscopy. The overlying skin was then prepped and draped in the standard sterile fashion. Local analgesia was obtained with 1% lidocaine. Attention was first turned to the right side. Under fluoroscopic guidance, a 10 gauge introducer needle was advanced towards the lateral margin of the pedicle. Using multiple projections, the introducer needle was advanced towards the posterior margin of the vertebral body via a transpedicular approach. The posterior margin of the ablation zone was then marked using the inner needle of the introducer. Next, the marking drill was advanced towards the anterior margin of the vertebral body for RF probe selection. The 15 mm RF ablation probe was determined to be appropriate. Attention was then turned to the contralateral side. Under fluoroscopic guidance, a 10 gauge introducer needle was advanced towards the lateral margin of the pedicle. Using multiple projections, the introducer needle was  advanced towards the posterior margin of the vertebral body via a transpedicular approach. Again, the posterior margin of the ablation zone was then marked using the inner needle of the introducer. In a similar fashion, core needle biopsy of the vertebral body was performed using a 10 gauge core biopsy device. Small amount of tissue was again retrieved and sent to the lab for analysis. The marking drill was then advanced towards the anterior margin of the vertebral body for RF probe selection. The 15 mm RF ablation probe was determined to be appropriate. The selected RF ablation probes were then advanced through the bilateral transpedicular access needles and locked into position. Appropriate location within the vertebral body was confirmed with multiple projections. RF ablation was then performed for 15 minutes at 70 degrees Celsius. The patient tolerated this portion of the procedure well without significant discomfort. At this time, the acrylic bone cement mixture was reconstituted in the Kyphon bone mixing device system. This was then loaded onto the Kyphon bone fillers. Upon completion of the RF ablation, the ablation probes were removed. The Kyphon 15 mm inflatable bone tamps were then advanced through the bilateral transpedicular access needles and positioned within the mid vertebral body. Kyphoplasty was then performed, ensuring that the balloon contours stayed within the vertebral body margins. The balloons were then deflated and removed, followed by advancement of the bone filler devices bilaterally and the instillation of acrylic bone cement with excellent filling in the AP and lateral projections. A small amount of cement was noted in the T12-L1 disc space, which served as the stopping point for cement instillation. A small amount of cement was seen along the left lateral border of the L1 vertebral body, again serving as the stopping point for cement instillation. These steps were performed at both T12  and L1 to complete balloon kyphoplasty at both levels. At the end of the procedure, the introducer cannulas and bone filler devices were then removed without difficulty. Clean dressings were placed after hemostasis. The patient tolerated all aspects of the procedure well, and was transferred to recovery in stable condition. IMPRESSION: 1. Successful T12 radiofrequency ablation and vertebral body augmentation using balloon kyphoplasty for treatment  of symptomatic metastatic breast cancer. 2. Successful L1 radiofrequency ablation and vertebral body augmentation using kyphoplasty for treatment of symptomatic breast cancer. If the patient has known osteoporosis, recommend treatment as clinically indicated. If the patient's bone density status is unknown, DEXA scan is recommended. Electronically Signed   By: Albin Felling M.D.   On: 05/29/2022 13:47   IR KYPHO THORACIC WITH BONE BIOPSY  Result Date: 05/29/2022 INDICATION: Metastatic breast cancer, new fractures involving T12 and L1 with lower back pain EXAM: 1. T12 vertebral body radiofrequency ablation using fluoroscopic guidance (OsteoCool) 2. T12 vertebral body augmentation using balloon kyphoplasty 3. L1 vertebral body radiofrequency ablation using fluoroscopic guidance (OsteoCool) 4. L1 vertebral body augmentation using balloon kyphoplasty COMPARISON:  None Available. MEDICATIONS: Refer to EMR ANESTHESIA/SEDATION: Moderate (conscious) sedation was employed during this procedure. A total of Versed 6 mg and Fentanyl 150 mcg was administered intravenously by the radiology nurse. Total intra-service moderate Sedation Time: 74 minutes. The patient's level of consciousness and vital signs were monitored continuously by radiology nursing throughout the procedure under my direct supervision. FLUOROSCOPY: Radiation Exposure Index (as provided by the fluoroscopic device): 11.5 minutes (77 mGy) COMPLICATIONS: None immediate. PROCEDURE: Informed written consent was  obtained from the patient after a thorough discussion of the procedural risks, benefits and alternatives. All questions were addressed. Maximal Sterile Barrier Technique was utilized including caps, mask, sterile gowns, sterile gloves, sterile drape, hand hygiene and skin antiseptic. A timeout was performed prior to the initiation of the procedure. The patient was positioned prone on the exam table. A bipedicular access was planned. Skin entry site(s) were marked overlying the T12 vertebral body using fluoroscopy. The overlying skin was then prepped and draped in the standard sterile fashion. Local analgesia was obtained with 1% lidocaine. Attention was first turned to the right side. Under fluoroscopic guidance, a 10 gauge introducer needle was advanced towards the lateral margin of the pedicle. Using multiple projections, the introducer needle was advanced towards the posterior margin of the vertebral body via a transpedicular approach. The posterior margin of the ablation zone was then marked using the inner needle of the introducer. Next, the marking drill was advanced towards the anterior margin of the vertebral body for RF probe selection. The 15 mm RF ablation probe was determined to be appropriate. Attention was then turned to the contralateral side. Under fluoroscopic guidance, a 10 gauge introducer needle was advanced towards the lateral margin of the pedicle. Using multiple projections, the introducer needle was advanced towards the posterior margin of the vertebral body via a transpedicular approach. Again, the posterior margin of the ablation zone was then marked using the inner needle of the introducer. In a similar fashion, core needle biopsy of the vertebral body was performed using a 10 gauge core biopsy device. Small amount of tissue was again retrieved and sent to the lab for analysis. The marking drill was then advanced towards the anterior margin of the vertebral body for RF probe selection. The  15 mm RF ablation probe was determined to be appropriate. The selected RF ablation probes were then advanced through the bilateral transpedicular access needles and locked into position. Appropriate location within the vertebral body was confirmed with multiple projections. RF ablation was then performed for 15 minutes at 70 degrees Celsius. The patient tolerated this portion of the procedure well without significant discomfort. Attention was then turned to L1. A bipedicular access was planned. Skin entry site(s) were marked overlying the L1 vertebral body using fluoroscopy. The overlying skin  was then prepped and draped in the standard sterile fashion. Local analgesia was obtained with 1% lidocaine. Attention was first turned to the right side. Under fluoroscopic guidance, a 10 gauge introducer needle was advanced towards the lateral margin of the pedicle. Using multiple projections, the introducer needle was advanced towards the posterior margin of the vertebral body via a transpedicular approach. The posterior margin of the ablation zone was then marked using the inner needle of the introducer. Next, the marking drill was advanced towards the anterior margin of the vertebral body for RF probe selection. The 15 mm RF ablation probe was determined to be appropriate. Attention was then turned to the contralateral side. Under fluoroscopic guidance, a 10 gauge introducer needle was advanced towards the lateral margin of the pedicle. Using multiple projections, the introducer needle was advanced towards the posterior margin of the vertebral body via a transpedicular approach. Again, the posterior margin of the ablation zone was then marked using the inner needle of the introducer. In a similar fashion, core needle biopsy of the vertebral body was performed using a 10 gauge core biopsy device. Small amount of tissue was again retrieved and sent to the lab for analysis. The marking drill was then advanced towards the  anterior margin of the vertebral body for RF probe selection. The 15 mm RF ablation probe was determined to be appropriate. The selected RF ablation probes were then advanced through the bilateral transpedicular access needles and locked into position. Appropriate location within the vertebral body was confirmed with multiple projections. RF ablation was then performed for 15 minutes at 70 degrees Celsius. The patient tolerated this portion of the procedure well without significant discomfort. At this time, the acrylic bone cement mixture was reconstituted in the Kyphon bone mixing device system. This was then loaded onto the Kyphon bone fillers. Upon completion of the RF ablation, the ablation probes were removed. The Kyphon 15 mm inflatable bone tamps were then advanced through the bilateral transpedicular access needles and positioned within the mid vertebral body. Kyphoplasty was then performed, ensuring that the balloon contours stayed within the vertebral body margins. The balloons were then deflated and removed, followed by advancement of the bone filler devices bilaterally and the instillation of acrylic bone cement with excellent filling in the AP and lateral projections. A small amount of cement was noted in the T12-L1 disc space, which served as the stopping point for cement instillation. A small amount of cement was seen along the left lateral border of the L1 vertebral body, again serving as the stopping point for cement instillation. These steps were performed at both T12 and L1 to complete balloon kyphoplasty at both levels. At the end of the procedure, the introducer cannulas and bone filler devices were then removed without difficulty. Clean dressings were placed after hemostasis. The patient tolerated all aspects of the procedure well, and was transferred to recovery in stable condition. IMPRESSION: 1. Successful T12 radiofrequency ablation and vertebral body augmentation using balloon kyphoplasty  for treatment of symptomatic metastatic breast cancer. 2. Successful L1 radiofrequency ablation and vertebral body augmentation using kyphoplasty for treatment of symptomatic breast cancer. If the patient has known osteoporosis, recommend treatment as clinically indicated. If the patient's bone density status is unknown, DEXA scan is recommended. Electronically Signed   By: Albin Felling M.D.   On: 05/29/2022 13:47   IR Radiologist Eval & Mgmt  Result Date: 05/22/2022 EXAM: NEW PATIENT OFFICE VISIT CHIEF COMPLAINT: Refer to EMR HISTORY OF PRESENT ILLNESS: Patient  reports a history of breast lumpectomy in 2014, with recurrence of her breast cancer in November 2022 that started with evaluation of her left hip pain. PET-CT in December 2022 demonstrated diffuse metastatic disease, and the patient underwent external beam radiation therapy from January to March 2023 for treatment of her sternum, T-spine, and left hip. She had worsening lower back pain over the course of the severe, and CT chest abdomen pelvis on May 10, 2022 demonstrated new pathologic compression fractures involving T12, and L1 with approximately 50% height loss. She reports at least moderate pain that she rates 6/10, with only mild improvement to 4/10 with prescription pain medication. She reports moderate to severe disability on the [employer] disability questionnaire with 17/24 positive, citing difficulties with movement and sleep, including her ADLs such as dressing herself. REVIEW OF SYSTEMS: Refer to EMR PHYSICAL EXAMINATION: Refer to EMR ASSESSMENT AND PLAN: Refer to EMR Electronically Signed   By: Albin Felling M.D.   On: 05/22/2022 14:07      HISTORY:   Past Medical History:  Diagnosis Date   Achilles tendinitis of left lower extremity 04/22/2018   Acute peptic ulcer without hemorrhage and without perforation 11/29/2020   Acute sinusitis 11/29/2020   Bipolar disorder ([place]) 11/29/2020   Breast cancer ([place])    Cancer (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   Mixed hyperlipidemia 11/29/2020   Obesity due to excess calories    Osteopenia after menopause 07/04/2020   Other long term (current) drug therapy 11/29/2020   Other vitamin B12 deficiency anemias 11/29/2020   Personal history of malignant neoplasm of breast 11/29/2020   Pre-diabetes    Renal cyst, acquired, right 07/04/2020   14.5 cm in October 2021   Retrocalcaneal bursitis (back of heel), left 04/22/2018   Seasonal allergic rhinitis 11/29/2020   Tightness of heel cord, left 04/22/2018   Type 2 diabetes mellitus without complications ([place]) 5/80/9983   Vitamin D deficiency 11/29/2020    Past Surgical History:  Procedure Laterality Date   CESAREAN SECTION     IR BONE TUMOR(S)RF ABLATION  05/29/2022   IR BONE TUMOR(S)RF ABLATION  05/29/2022   IR KYPHO EA ADDL LEVEL THORACIC OR LUMBAR  05/29/2022   IR KYPHO THORACIC WITH BONE BIOPSY  05/29/2022   IR RADIOLOGIST EVAL & MGMT  05/22/2022   LIVER BIOPSY  01/2019   lumpectomy  2014   Left Breast   PORT-A-CATH REMOVAL     PORTACATH PLACEMENT  08/28/2013   STOMACH SURGERY     Tummy Tuck   TOTAL HIP ARTHROPLASTY Right 07/04/2021   Procedure: TOTAL HIP ARTHROPLASTY;  Surgeon: Paralee Cancel, MD;  Location: [facility];  Service: Orthopedics;  Laterality: Right;  45   WISDOM TOOTH EXTRACTION      Family History  Problem Relation Age of Onset   Hyperlipidemia Mother    Diabetes Father    Stroke Father    Colon cancer Neg Hx    Colon polyps Neg Hx     Esophageal  cancer Neg Hx    Rectal cancer Neg Hx    Stomach cancer Neg Hx     Social History:  reports that she quit smoking about 19 years ago. Her smoking use included cigarettes. She has never used smokeless tobacco. She reports current alcohol use. She reports that she does not use drugs.The patient is accompanied by her husband today.  Allergies: No Known Allergies  Current Medications: Current Outpatient Medications  Medication Sig Dispense Refill   albuterol (VENTOLIN HFA) 108 (90 Base) MCG/ACT inhaler Inhale 2 puffs into the lungs every 6 (six) hours as needed for shortness of breath.     ARIPiprazole (ABILIFY) 5 MG tablet Take 2.5 mg by mouth daily.     aspirin 81 MG EC tablet Take 81 mg by mouth daily. Swallow whole.     atorvastatin (LIPITOR) 20 MG tablet Take 1 tablet (20 mg total) by mouth daily. 90 tablet 3   augmented betamethasone dipropionate (DIPROLENE-AF) 0.05 % cream betamethasone, augmented 0.05 % topical cream (Patient not taking: Reported on 05/22/2022)     calcium carbonate (OS-CAL) 1250 (500 Ca) MG chewable tablet Chew 6 tablets by mouth daily.     CLENPIQ 10-3.5-12 MG-GM -GM/160ML SOLN      desvenlafaxine (PRISTIQ) 50 MG 24 hr tablet Take 50 mg by mouth daily.     diphenoxylate-atropine (LOMOTIL) 2.5-0.025 MG tablet TAKE TWO TABLETS BY MOUTH FOUR TIMES A DAY AS NEEDED FOR DIARRHEA OR LOOSE STOOLS 100 tablet 1   FARXIGA 10 MG TABS tablet Take 1 tablet (10 mg total) by mouth daily. 30 tablet 11   furosemide (LASIX) 20 MG tablet Take 1 tablet (20 mg total) by mouth daily. (Patient not taking: Reported on 05/22/2022) 30 tablet 0   labetalol (NORMODYNE) 200 MG tablet Take 1 tablet (200 mg total) by mouth 2 (two) times daily. 180 tablet 3   Loratadine 10 MG CAPS Take 1 capsule (10 mg total) by mouth daily. 30 capsule 6   LORazepam (ATIVAN) 1 MG tablet Take 1 tablet (1 mg total) by mouth every 8 (eight) hours. 30 tablet 0   magic mouthwash (lidocaine, diphenhydrAMINE,  alum & mag hydroxide) suspension Take 5 mLs by mouth every 3 (three) hours as needed. Swish and spit; for throat/mouth pain (Patient not taking: Reported on 05/22/2022)     naloxone [employer]) nasal spray 4 mg/0.1 mL One spray in nostril as needed, may repeat every 2 to 3 minutes until medical assistance becomes available 4 each 1   nitroGLYCERIN (NITROSTAT) 0.4 MG SL tablet Place 0.4 mg under the tongue every 5 (five) minutes as needed for chest pain.     omeprazole (PRILOSEC) 40 MG capsule      ondansetron (ZOFRAN) 4 MG tablet Take 1 tablet (4 mg total) by mouth every 4 (four) hours as needed for nausea. 90 tablet 3   oxyCODONE ER (XTAMPZA ER) 9 MG C12A Take 2 tablets by mouth 2 (two) times daily. (Patient not taking: Reported on 05/16/2022) 120 capsule 0   Oxycodone HCl 10 MG TABS Take 1 tablet (10 mg total) by mouth every 4 (four) hours as needed. 120 tablet 0   OZEMPIC, 0.25 OR 0.5 MG/DOSE, 2 MG/3ML SOPN Inject into the skin.     potassium chloride SA (KLOR-CON M) 20 MEQ tablet Take 1 tablet (20 mEq total) by mouth 2 (two) times daily. (Patient not taking: Reported on 05/22/2022) 60 tablet 0   prochlorperazine (COMPAZINE) 10 MG tablet Take 1 tablet (10 mg total) by  mouth every 6 (six) hours as needed for nausea or vomiting. (Patient not taking: Reported on 05/22/2022) 90 tablet 3   topiramate (TOPAMAX) 25 MG tablet Alternate with 50 mg daily for 1 week, then take 25 mg daily for 1 week, then take 25 mg every other day for 1 week then stop (Patient not taking: Reported on 05/22/2022) 30 tablet 0   topiramate (TOPAMAX) 50 MG tablet Take by mouth every other day.     valsartan (DIOVAN) 40 MG tablet Take 1 tablet (40 mg total) by mouth daily. 90 tablet 3   Vitamin D, Ergocalciferol, (DRISDOL) 1.25 MG (50000 UNIT) CAPS capsule Take 50,000 Units by mouth once a week.     Current Facility-Administered Medications  Medication Dose Route Frequency Provider Last Rate Last Admin   0.9 %  sodium chloride  infusion  500 mL Intravenous Once Jackquline Denmark, MD       technetium sestamibi generic (CARDIOLITE) injection 59.4 millicurie  58.5 millicurie Intravenous Once PRN Revankar, Reita Cliche, MD

## 2022-06-05 NOTE — Assessment & Plan Note (Signed)
Known hepatic steatosis with recurrent elevation of the transaminases with uncertain etiology.  It is not felt to be associated with the HER2 targeted therapy.  We have been tapering her Topamax to see if that would help, but the transaminases are worsening.  We may need to look at other medication as a cause.

## 2022-06-05 NOTE — Assessment & Plan Note (Addendum)
Microcytic anemia.  She has had some bright red blood per rectum, which she attributes to hemorrhoids.  Iron studies were equivocal, so soluble transferrin was added and was elevated, which is consistent with iron deficiency.  As her hemoglobin is dropping, I will arrange for her to have IV iron in the form of Venofer in the upcoming days.

## 2022-06-05 NOTE — Assessment & Plan Note (Addendum)
Bone metastases from hormone receptor positive breast cancer diagnosed in November 2022.  There were multiple marrow replacing lesions within the proximal right femur within the ischium, as well as the inferior rami. 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  Estrogen and progesterone receptors were once again positive.  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 Ki67 was 60%. PET revealed dominant finding of intensely hypermetabolic aggressive skeletal metastasis involving the calvarium, sternum, thoracic and lumbar spine, and pelvis, with potential pathologic fracture of the right femur with internal fixation. There was soft tissue extension into the anterior mediastinum associated with the manubrial metastasis. Multiple spinal vertebral body lesions have cortical destruction along the posterior margin.She started denosumab monthly in December.    She received radiation therapy to the right hip, left sacrum/SI joint and upper T-spine/sternum in January. She was then placed on chemotherapy with docetaxel/trastuzumab/pertuzumab and completed 8 cycles.  She continues trastuzumab/pertuzumab as maintenance.    In September, she had worsening left shoulder pain.  Plain films did not reveal any acute osseous abnormality.  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  As she was using oxycodone 10 mg every 4 hours around-the-clock, I recommended extended release oxycodone.  She had tried extended release morphine sulfate prior to this and did not tolerate it because of difficulty breathing.  Extended release oxycodone was not covered by her insurance, so I placed her on Xtampza ER 18 mg every 12 hours.  She states she did not tolerate the Xtampza due to sensation of difficulty breathing, so she discontinued this.  However, she states she slept well with the initial dose of Xtampza, but then experienced the side affect with the morning dose.   She continues oxycodone 10 mg every 4 hours around-the-clock.  I recommended that she try Xtampza ER 9 mg every 12 hours to see if she will tolerate this.  CT chest, abdomen and pelvis revealed new compression fractures at T12 with greater than 50% loss of height and at L1 with 40 to 50% loss of height since her PET scan in December 2022.  This is the area of her pain.  She will was referred to interventional radiology and underwent kyphoplasty of T12.  She has not yet been started on hormonal therapy.  I was tapering her Topamax due to elevated transaminases and did not want to cloud the picture with a new medication.  She is down to Topamax 25 mg daily and the transaminases have actually worsened.  CT abdomen in September did not reveal any suspicious hepatic lesion but the patient has known hepatic steatosis.  We will continue to monitor this.  She will proceed with an 12th cycle of trastuzumab/pertuzumab this week.  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  I will discuss her case with Dr. Orlene Erm and see if there is any room for further radiation treatment.  We will plan to see her back in 3 weeks for repeat clinical assessment prior to a 13th cycle.

## 2022-06-05 NOTE — Telephone Encounter (Signed)
Phone call to pt to follow up from her kyphoplasty on 05/29/22. Pt reports her pain is completely gone post procedure but is still having "a little soreness". Pt reports she is able to move around a little better. Pt denies any signs of infection, redness at the site, draining or fever. She did mention that she had a blister which popped and scabbed from the reaction her skin had to the drape on her back but another doctor looked at it and said it was healing fine. Pt has no complaints at this time and will be scheduled for a telephone follow up with Dr. Denna Haggard next week. Pt advised to call back if anything were to change or any concerns arise and we will arrange an in person appointment. Pt verbalized understanding.

## 2022-06-06 ENCOUNTER — Encounter: Payer: Self-pay | Admitting: Hematology and Oncology

## 2022-06-06 ENCOUNTER — Encounter: Payer: Self-pay | Admitting: Oncology

## 2022-06-06 ENCOUNTER — Telehealth: Payer: Self-pay

## 2022-06-06 ENCOUNTER — Other Ambulatory Visit: Payer: Self-pay

## 2022-06-06 NOTE — Telephone Encounter (Signed)
Patient notified

## 2022-06-06 NOTE — Telephone Encounter (Signed)
-----   Message from [name], PA-C sent at 06/06/2022  8:42 AM EDT ----- Please let her know A1C was 5.9. Thanks

## 2022-06-06 NOTE — Assessment & Plan Note (Signed)
History of stage IIB hormone receptor positive breast cancer diagnosed in December 2014.  She was treated with surgery, chemotherapy and radiation therapy. She was on adjuvant hormonal therapy with tamoxifen 20 mg daily from October 2015 to May 2020, but due to elevation of the liver transaminases was switched to anastrazole in June 2020.  She now has recurrent disease with bone metastasis.

## 2022-06-07 ENCOUNTER — Other Ambulatory Visit: Payer: Self-pay | Admitting: Pharmacist

## 2022-06-07 DIAGNOSIS — Z170 Estrogen receptor positive status [ER+]: Secondary | ICD-10-CM

## 2022-06-07 MED FILL — Pertuzumab Soln for IV Infusion 420 MG/14ML (30 MG/ML): INTRAVENOUS | Qty: 14 | Status: AC

## 2022-06-07 MED FILL — Trastuzumab-dkst For IV Soln 150 MG: INTRAVENOUS | Qty: 17 | Status: AC

## 2022-06-08 ENCOUNTER — Inpatient Hospital Stay: Payer: BC Managed Care – PPO

## 2022-06-08 VITALS — BP 109/72 | HR 84 | Temp 98.4°F | Resp 14 | Ht 59.0 in | Wt 133.1 lb

## 2022-06-08 DIAGNOSIS — Z5111 Encounter for antineoplastic chemotherapy: Secondary | ICD-10-CM | POA: Diagnosis not present

## 2022-06-08 DIAGNOSIS — Z170 Estrogen receptor positive status [ER+]: Secondary | ICD-10-CM

## 2022-06-08 DIAGNOSIS — C7951 Secondary malignant neoplasm of bone: Secondary | ICD-10-CM

## 2022-06-08 MED ORDER — HEPARIN SOD (PORK) LOCK FLUSH 100 UNIT/ML IV SOLN
500.0000 [IU] | Freq: Once | INTRAVENOUS | Status: AC | PRN
  Administered 2022-06-08: 500 [IU]

## 2022-06-08 MED ORDER — SODIUM CHLORIDE 0.9 % IV SOLN: Freq: Once | INTRAVENOUS | Status: AC

## 2022-06-08 MED ORDER — SODIUM CHLORIDE 0.9 % IV SOLN
420.0000 mg | Freq: Once | INTRAVENOUS | Status: AC
  Administered 2022-06-08: 420 mg via INTRAVENOUS
  Filled 2022-06-08: qty 14

## 2022-06-08 MED ORDER — DIPHENHYDRAMINE HCL 25 MG PO CAPS
25.0000 mg | ORAL_CAPSULE | Freq: Once | ORAL | Status: AC
  Administered 2022-06-08: 25 mg via ORAL
  Filled 2022-06-08: qty 1

## 2022-06-08 MED ORDER — INFLUENZA VAC SPLIT QUAD 0.5 ML IM SUSY
0.5000 mL | PREFILLED_SYRINGE | Freq: Once | INTRAMUSCULAR | Status: AC
  Administered 2022-06-08: 0.5 mL via INTRAMUSCULAR
  Filled 2022-06-08: qty 0.5

## 2022-06-08 MED ORDER — SODIUM CHLORIDE 0.9% FLUSH
10.0000 mL | INTRAVENOUS | Status: DC | PRN
  Administered 2022-06-08: 10 mL

## 2022-06-08 MED ORDER — ACETAMINOPHEN 325 MG PO TABS
650.0000 mg | ORAL_TABLET | Freq: Once | ORAL | Status: AC
  Administered 2022-06-08: 650 mg via ORAL
  Filled 2022-06-08: qty 2

## 2022-06-08 MED ORDER — TRASTUZUMAB-DKST CHEMO 150 MG IV SOLR
6.0000 mg/kg | Freq: Once | INTRAVENOUS | Status: AC
  Administered 2022-06-08: 357 mg via INTRAVENOUS
  Filled 2022-06-08: qty 8

## 2022-06-08 MED FILL — Iron Sucrose Inj 20 MG/ML (Fe Equiv): INTRAVENOUS | Qty: 10 | Status: AC

## 2022-06-08 NOTE — Patient Instructions (Signed)
Influenza Vaccine Injection What is this medication? INFLUENZA VACCINE (in floo EN zuh vak SEEN) reduces the risk of the influenza (flu). It does not treat influenza. It is still possible to get influenza after receiving this vaccine, but the symptoms may be less severe or not last as long. It works by helping your immune system learn how to fight off a future infection. This medicine may be used for other purposes; ask your health care provider or pharmacist if you have questions. COMMON BRAND NAME(S): Afluria, Afluria Quadrivalent, Agriflu, Alfuria, FLUAD, FLUAD Quadrivalent, Fluarix, Fluarix Quadrivalent, Flublok, Flublok Quadrivalent, FLUCELVAX, FLUCELVAX Quadrivalent, Flulaval, Flulaval Quadrivalent, Fluvirin, Fluzone, Fluzone High-Dose, Fluzone Intradermal, Fluzone Quadrivalent What should I tell my care team before I take this medication? They need to know if you have any of these conditions: Bleeding disorder like hemophilia Fever or infection Guillain-Barre syndrome or other neurological problems Immune system problems Infection with the human immunodeficiency virus (HIV) or AIDS Low blood platelet counts Multiple sclerosis An unusual or allergic reaction to influenza virus vaccine, latex, other medications, foods, dyes, or preservatives. Different brands of vaccines contain different allergens. Some may contain latex or eggs. Talk to your care team about your allergies to make sure that you get the right vaccine. Pregnant or trying to get pregnant Breastfeeding How should I use this medication? This vaccine is injected into a muscle or under the skin. It is given by your care team. A copy of Vaccine Information Statements will be given before each vaccination. Be sure to read this sheet carefully each time. This sheet may change often. Talk to your care team to see which vaccines are right for you. Some vaccines should not be used in all age groups. Overdosage: If you think you have  taken too much of this medicine contact a poison control center or emergency room at once. NOTE: This medicine is only for you. Do not share this medicine with others. What if I miss a dose? This does not apply. What may interact with this medication? Certain medications that lower your immune system, such as etanercept, anakinra, infliximab, adalimumab Certain medications that prevent or treat blood clots, such as warfarin Chemotherapy or radiation therapy Phenytoin Steroid medications, such as prednisone or cortisone Theophylline Vaccines This list may not describe all possible interactions. Give your health care provider a list of all the medicines, herbs, non-prescription drugs, or dietary supplements you use. Also tell them if you smoke, drink alcohol, or use illegal drugs. Some items may interact with your medicine. What should I watch for while using this medication? Report any side effects that do not go away with your care team. Call your care team if any unusual symptoms occur within 6 weeks of receiving this vaccine. You may still catch the flu, but the illness is not usually as bad. You cannot get the flu from the vaccine. The vaccine will not protect against colds or other illnesses that may cause fever. The vaccine is needed every year. What side effects may I notice from receiving this medication? Side effects that you should report to your care team as soon as possible: Allergic reactions--skin rash, itching, hives, swelling of the face, lips, tongue, or throat Side effects that usually do not require medical attention (report these to your care team if they continue or are bothersome): Chills Fatigue Headache Joint pain Loss of appetite Muscle pain Nausea Pain, redness, or irritation at injection site This list may not describe all possible side effects. Call your doctor  for medical advice about side effects. You may report side effects to FDA at 1-800-FDA-1088. Where  should I keep my medication? The vaccine is only given by your care team. It will not be stored at home. NOTE: This sheet is a summary. It may not cover all possible information. If you have questions about this medicine, talk to your doctor, pharmacist, or health care provider.  2023 Elsevier/Gold Standard (2007-09-20 00:00:00) Trastuzumab Injection What is this medication? TRASTUZUMAB (tras TOO zoo mab) treats breast cancer and stomach cancer. It works by blocking a protein that causes cancer cells to grow and multiply. This helps to slow or stop the spread of cancer cells. This medicine may be used for other purposes; ask your health care provider or pharmacist if you have questions. COMMON BRAND NAME(S): Herceptin, Janae Bridgeman, Ontruzant, Trazimera What should I tell my care team before I take this medication? They need to know if you have any of these conditions: Heart failure Lung disease An unusual or allergic reaction to trastuzumab, other medications, foods, dyes, or preservatives Pregnant or trying to get pregnant Breast-feeding How should I use this medication? This medication is injected into a vein. It is given by your care team in a hospital or clinic setting. Talk to your care team about the use of this medication in children. It is not approved for use in children. Overdosage: If you think you have taken too much of this medicine contact a poison control center or emergency room at once. NOTE: This medicine is only for you. Do not share this medicine with others. What if I miss a dose? Keep appointments for follow-up doses. It is important not to miss your dose. Call your care team if you are unable to keep an appointment. What may interact with this medication? Certain types of chemotherapy, such as daunorubicin, doxorubicin, epirubicin, idarubicin This list may not describe all possible interactions. Give your health care provider a list of all the medicines,  herbs, non-prescription drugs, or dietary supplements you use. Also tell them if you smoke, drink alcohol, or use illegal drugs. Some items may interact with your medicine. What should I watch for while using this medication? Your condition will be monitored carefully while you are receiving this medication. This medication may make you feel generally unwell. This is not uncommon, as chemotherapy affects healthy cells as well as cancer cells. Report any side effects. Continue your course of treatment even though you feel ill unless your care team tells you to stop. This medication may increase your risk of getting an infection. Call your care team for advice if you get a fever, chills, sore throat, or other symptoms of a cold or flu. Do not treat yourself. Try to avoid being around people who are sick. Avoid taking medications that contain aspirin, acetaminophen, ibuprofen, naproxen, or ketoprofen unless instructed by your care team. These medications can hide a fever. Talk to your care team if you may be pregnant. Serious birth defects can occur if you take this medication during pregnancy and for 7 months after the last dose. You will need a negative pregnancy test before starting this medication. Contraception is recommended while taking this medication and for 7 months after the last dose. Your care team can help you find the option that works for you. Do not breastfeed while taking this medication and for 7 months after stopping treatment. What side effects may I notice from receiving this medication? Side effects that you should report to  your care team as soon as possible: Allergic reactions or angioedema--skin rash, itching or hives, swelling of the face, eyes, lips, tongue, arms, or legs, trouble swallowing or breathing Dry cough, shortness of breath or trouble breathing Heart failure--shortness of breath, swelling of the ankles, feet, or hands, sudden weight gain, unusual weakness or  fatigue Infection--fever, chills, cough, or sore throat Infusion reactions--chest pain, shortness of breath or trouble breathing, feeling faint or lightheaded Side effects that usually do not require medical attention (report to your care team if they continue or are bothersome): Diarrhea Dizziness Headache Nausea Trouble sleeping Vomiting This list may not describe all possible side effects. Call your doctor for medical advice about side effects. You may report side effects to FDA at 1-800-FDA-1088. Where should I keep my medication? This medication is given in a hospital or clinic. It will not be stored at home. NOTE: This sheet is a summary. It may not cover all possible information. If you have questions about this medicine, talk to your doctor, pharmacist, or health care provider.  2023 Elsevier/Gold Standard (2021-11-30 00:00:00) Pertuzumab Injection What is this medication? PERTUZUMAB (per TOOZ ue mab) treats breast cancer. It works by blocking a protein that causes cancer cells to grow and multiply. This helps to slow or stop the spread of cancer cells. It is a monoclonal antibody. This medicine may be used for other purposes; ask your health care provider or pharmacist if you have questions. COMMON BRAND NAME(S): PERJETA What should I tell my care team before I take this medication? They need to know if you have any of these conditions: Heart failure An unusual or allergic reaction to pertuzumab, other medications, foods, dyes, or preservatives Pregnant or trying to get pregnant Breast-feeding How should I use this medication? This medication is injected into a vein. It is given by your care team in a hospital or clinic setting. Talk to your care team about the use of this medication in children. Special care may be needed. Overdosage: If you think you have taken too much of this medicine contact a poison control center or emergency room at once. NOTE: This medicine is only  for you. Do not share this medicine with others. What if I miss a dose? Keep appointments for follow-up doses. It is important not to miss your dose. Call your care team if you are unable to keep an appointment. What may interact with this medication? Interactions are not expected. This list may not describe all possible interactions. Give your health care provider a list of all the medicines, herbs, non-prescription drugs, or dietary supplements you use. Also tell them if you smoke, drink alcohol, or use illegal drugs. Some items may interact with your medicine. What should I watch for while using this medication? Your condition will be monitored carefully while you are receiving this medication. This medication may make you feel generally unwell. This is not uncommon as chemotherapy can affect healthy cells as well as cancer cells. Report any side effects. Continue your course of treatment even though you feel ill unless your care team tells you to stop. Talk to your care team if you may be pregnant. Serious birth defects can occur if you take this medication during pregnancy and for 7 months after the last dose. You will need a negative pregnancy test before starting this medication. Contraception is recommended while taking this medication and for 7 months after the last dose. Your care team can help you find the option that works for  you. Do not breastfeed while taking this medication and for 7 months after the last dose. What side effects may I notice from receiving this medication? Side effects that you should report to your care team as soon as possible: Allergic reactions or angioedema--skin rash, itching or hives, swelling of the face, eyes, lips, tongue, arms, or legs, trouble swallowing or breathing Heart failure--shortness of breath, swelling of the ankles, feet, or hands, sudden weight gain, unusual weakness or fatigue Infusion reactions--chest pain, shortness of breath or trouble  breathing, feeling faint or lightheaded Side effects that usually do not require medical attention (report to your care team if they continue or are bothersome): Diarrhea Dry skin Fatigue Hair loss Nausea Vomiting This list may not describe all possible side effects. Call your doctor for medical advice about side effects. You may report side effects to FDA at 1-800-FDA-1088. Where should I keep my medication? This medication is given in a hospital or clinic. It will not be stored at home. NOTE: This sheet is a summary. It may not cover all possible information. If you have questions about this medicine, talk to your doctor, pharmacist, or health care provider.  2023 Elsevier/Gold Standard (2021-12-12 00:00:00)

## 2022-06-11 ENCOUNTER — Inpatient Hospital Stay: Payer: BC Managed Care – PPO

## 2022-06-11 VITALS — BP 105/68 | HR 90 | Temp 98.6°F | Resp 16 | Wt 135.0 lb

## 2022-06-11 DIAGNOSIS — Z5111 Encounter for antineoplastic chemotherapy: Secondary | ICD-10-CM | POA: Diagnosis not present

## 2022-06-11 DIAGNOSIS — C7951 Secondary malignant neoplasm of bone: Secondary | ICD-10-CM

## 2022-06-11 MED ORDER — SODIUM CHLORIDE 0.9% FLUSH
10.0000 mL | INTRAVENOUS | Status: DC | PRN
  Administered 2022-06-11: 10 mL

## 2022-06-11 MED ORDER — SODIUM CHLORIDE 0.9 % IV SOLN: Freq: Once | INTRAVENOUS | Status: AC

## 2022-06-11 MED ORDER — SODIUM CHLORIDE 0.9 % IV SOLN
200.0000 mg | Freq: Once | INTRAVENOUS | Status: AC
  Administered 2022-06-11: 200 mg via INTRAVENOUS
  Filled 2022-06-11: qty 200

## 2022-06-11 MED ORDER — HEPARIN SOD (PORK) LOCK FLUSH 100 UNIT/ML IV SOLN
500.0000 [IU] | Freq: Once | INTRAVENOUS | Status: AC | PRN
  Administered 2022-06-11: 500 [IU]

## 2022-06-11 NOTE — Patient Instructions (Signed)

## 2022-06-12 ENCOUNTER — Ambulatory Visit
Admission: RE | Admit: 2022-06-12 | Discharge: 2022-06-12 | Disposition: A | Discharge status: Home / Self Care | Payer: BC Managed Care – PPO | Source: Ambulatory Visit | Attending: Interventional Radiology | Admitting: Interventional Radiology

## 2022-06-12 ENCOUNTER — Encounter: Payer: Self-pay | Admitting: Oncology

## 2022-06-12 DIAGNOSIS — Z712 Person consulting for explanation of examination or test findings: Secondary | ICD-10-CM

## 2022-06-12 HISTORY — PX: IR RADIOLOGIST EVAL & MGMT: IMG5224

## 2022-06-12 MED FILL — Iron Sucrose Inj 20 MG/ML (Fe Equiv): INTRAVENOUS | Qty: 10 | Status: AC

## 2022-06-12 NOTE — Progress Notes (Signed)
IR brief note   I spoke to [facility] stay over the phone for routine follow-up after two-level OsteoCool and kyphoplasty at T12 and L1 for treatment of symptomatic metastatic breast cancer, performed on May 29, 2022.  Patient reports significant improvement in her lower back pain with some mild residual soreness.  She is satisfied with the results of the procedure.  She is able to move and perform chores around the house and get dressed with much less difficulty now.  As discussed during initial consultation, I had considered additional treatment of her upper thoracic spine fractures following treatment of T12 and L1.  However, bone quality of T12 and L1 were significantly sclerotic, and would likely face a similarly challenging procedure in the upper thoracic spine, where patient had also further undergone external beam radiation.  Given that her upper thoracic spine fractures are not significantly symptomatic at this point (she complains of radiating arm pain, less so back pain), we agreed to watch her symptoms.  Patient may follow-up with me as needed.    Albin Felling, MD  Vascular and Interventional Radiology 06/12/2022 3:02 PM

## 2022-06-13 ENCOUNTER — Inpatient Hospital Stay: Payer: BC Managed Care – PPO | Attending: Hematology and Oncology

## 2022-06-13 VITALS — BP 112/69 | HR 88 | Temp 98.0°F | Resp 18

## 2022-06-13 DIAGNOSIS — Z170 Estrogen receptor positive status [ER+]: Secondary | ICD-10-CM | POA: Insufficient documentation

## 2022-06-13 DIAGNOSIS — C7951 Secondary malignant neoplasm of bone: Secondary | ICD-10-CM | POA: Diagnosis not present

## 2022-06-13 DIAGNOSIS — Z5111 Encounter for antineoplastic chemotherapy: Secondary | ICD-10-CM | POA: Insufficient documentation

## 2022-06-13 DIAGNOSIS — Z5112 Encounter for antineoplastic immunotherapy: Secondary | ICD-10-CM | POA: Diagnosis not present

## 2022-06-13 DIAGNOSIS — D509 Iron deficiency anemia, unspecified: Secondary | ICD-10-CM | POA: Diagnosis not present

## 2022-06-13 DIAGNOSIS — C50412 Malignant neoplasm of upper-outer quadrant of left female breast: Secondary | ICD-10-CM | POA: Insufficient documentation

## 2022-06-13 MED ORDER — HEPARIN SOD (PORK) LOCK FLUSH 100 UNIT/ML IV SOLN: 500.0000 [IU] | Freq: Once | INTRAVENOUS | Status: DC | PRN

## 2022-06-13 MED ORDER — ALTEPLASE 2 MG IJ SOLR: 2.0000 mg | Freq: Once | INTRAMUSCULAR | Status: DC | PRN

## 2022-06-13 MED ORDER — SODIUM CHLORIDE 0.9 % IV SOLN
200.0000 mg | Freq: Once | INTRAVENOUS | Status: AC
  Administered 2022-06-13: 200 mg via INTRAVENOUS
  Filled 2022-06-13: qty 200

## 2022-06-13 MED ORDER — SODIUM CHLORIDE 0.9 % IV SOLN: Freq: Once | INTRAVENOUS | Status: AC

## 2022-06-13 MED ORDER — SODIUM CHLORIDE 0.9% FLUSH: 10.0000 mL | INTRAVENOUS | Status: DC | PRN

## 2022-06-13 NOTE — Patient Instructions (Signed)

## 2022-06-14 ENCOUNTER — Encounter: Payer: Self-pay | Admitting: Oncology

## 2022-06-14 MED FILL — Iron Sucrose Inj 20 MG/ML (Fe Equiv): INTRAVENOUS | Qty: 10 | Status: AC

## 2022-06-15 ENCOUNTER — Inpatient Hospital Stay: Payer: BC Managed Care – PPO

## 2022-06-15 VITALS — BP 126/69 | HR 84 | Temp 98.2°F | Resp 14 | Ht 59.0 in | Wt 137.1 lb

## 2022-06-15 DIAGNOSIS — C7951 Secondary malignant neoplasm of bone: Secondary | ICD-10-CM

## 2022-06-15 DIAGNOSIS — Z5111 Encounter for antineoplastic chemotherapy: Secondary | ICD-10-CM | POA: Diagnosis not present

## 2022-06-15 MED ORDER — DENOSUMAB 120 MG/1.7ML ~~LOC~~ SOLN
120.0000 mg | Freq: Once | SUBCUTANEOUS | Status: AC
  Administered 2022-06-15: 120 mg via SUBCUTANEOUS
  Filled 2022-06-15: qty 1.7

## 2022-06-15 MED ORDER — SODIUM CHLORIDE 0.9 % IV SOLN: Freq: Once | INTRAVENOUS | Status: AC

## 2022-06-15 MED ORDER — SODIUM CHLORIDE 0.9 % IV SOLN
200.0000 mg | Freq: Once | INTRAVENOUS | Status: AC
  Administered 2022-06-15: 200 mg via INTRAVENOUS
  Filled 2022-06-15: qty 200

## 2022-06-15 NOTE — Patient Instructions (Signed)
Denosumab Injection (Oncology) What is this medication? DENOSUMAB (den oh SUE mab) prevents weakened bones caused by cancer. It may also be used to treat noncancerous bone tumors that cannot be removed by surgery. It can also be used to treat high calcium levels in the blood caused by cancer. It works by blocking a protein that causes bones to break down quickly. This slows down the release of calcium from bones, which lowers calcium levels in your blood. It also makes your bones stronger and less likely to break (fracture). This medicine may be used for other purposes; ask your health care provider or pharmacist if you have questions. COMMON BRAND NAME(S): XGEVA What should I tell my care team before I take this medication? They need to know if you have any of these conditions: Dental disease Having surgery or tooth extraction Infection Kidney disease Low levels of calcium or vitamin D in the blood Malnutrition On hemodialysis Skin conditions or sensitivity Thyroid or parathyroid disease An unusual reaction to denosumab, other medications, foods, dyes, or preservatives Pregnant or trying to get pregnant Breast-feeding How should I use this medication? This medication is for injection under the skin. It is given by your care team in a hospital or clinic setting. A special MedGuide will be given to you before each treatment. Be sure to read this information carefully each time. Talk to your care team about the use of this medication in children. While it may be prescribed for children as young as 13 years for selected conditions, precautions do apply. Overdosage: If you think you have taken too much of this medicine contact a poison control center or emergency room at once. NOTE: This medicine is only for you. Do not share this medicine with others. What if I miss a dose? Keep appointments for follow-up doses. It is important not to miss your dose. Call your care team if you are unable to  keep an appointment. What may interact with this medication? Do not take this medication with any of the following: Other medications containing denosumab This medication may also interact with the following: Medications that lower your chance of fighting infection Steroid medications, such as prednisone or cortisone This list may not describe all possible interactions. Give your health care provider a list of all the medicines, herbs, non-prescription drugs, or dietary supplements you use. Also tell them if you smoke, drink alcohol, or use illegal drugs. Some items may interact with your medicine. What should I watch for while using this medication? Your condition will be monitored carefully while you are receiving this medication. You may need blood work while taking this medication. This medication may increase your risk of getting an infection. Call your care team for advice if you get a fever, chills, sore throat, or other symptoms of a cold or flu. Do not treat yourself. Try to avoid being around people who are sick. You should make sure you get enough calcium and vitamin D while you are taking this medication, unless your care team tells you not to. Discuss the foods you eat and the vitamins you take with your care team. Some people who take this medication have severe bone, joint, or muscle pain. This medication may also increase your risk for jaw problems or a broken thigh bone. Tell your care team right away if you have severe pain in your jaw, bones, joints, or muscles. Tell your care team if you have any pain that does not go away or that gets worse. Talk  to your care team if you may be pregnant. Serious birth defects can occur if you take this medication during pregnancy and for 5 months after the last dose. You will need a negative pregnancy test before starting this medication. Contraception is recommended while taking this medication and for 5 months after the last dose. Your care team  can help you find the option that works for you. What side effects may I notice from receiving this medication? Side effects that you should report to your care team as soon as possible: Allergic reactions--skin rash, itching, hives, swelling of the face, lips, tongue, or throat Bone, joint, or muscle pain Low calcium level--muscle pain or cramps, confusion, tingling, or numbness in the hands or feet Osteonecrosis of the jaw--pain, swelling, or redness in the mouth, numbness of the jaw, poor healing after dental work, unusual discharge from the mouth, visible bones in the mouth Side effects that usually do not require medical attention (report to your care team if they continue or are bothersome): Cough Diarrhea Fatigue Headache Nausea This list may not describe all possible side effects. Call your doctor for medical advice about side effects. You may report side effects to FDA at 1-800-FDA-1088. Where should I keep my medication? This medication is given in a hospital or clinic. It will not be stored at home. NOTE: This sheet is a summary. It may not cover all possible information. If you have questions about this medicine, talk to your doctor, pharmacist, or health care provider.  2023 Elsevier/Gold Standard (2021-12-18 00:00:00) Iron Sucrose Injection What is this medication? IRON SUCROSE (EYE ern SOO krose) treats low levels of iron (iron deficiency anemia) in people with kidney disease. Iron is a mineral that plays an important role in making red blood cells, which carry oxygen from your lungs to the rest of your body. This medicine may be used for other purposes; ask your health care provider or pharmacist if you have questions. COMMON BRAND NAME(S): Venofer What should I tell my care team before I take this medication? They need to know if you have any of these conditions: Anemia not caused by low iron levels Heart disease High levels of iron in the blood Kidney disease Liver  disease An unusual or allergic reaction to iron, other medications, foods, dyes, or preservatives Pregnant or trying to get pregnant Breastfeeding How should I use this medication? This medication is for infusion into a vein. It is given in a hospital or clinic setting. Talk to your care team about the use of this medication in children. While this medication may be prescribed for children as young as 2 years for selected conditions, precautions do apply. Overdosage: If you think you have taken too much of this medicine contact a poison control center or emergency room at once. NOTE: This medicine is only for you. Do not share this medicine with others. What if I miss a dose? Keep appointments for follow-up doses. It is important not to miss your dose. Call your care team if you are unable to keep an appointment. What may interact with this medication? Do not take this medication with any of the following: Deferoxamine Dimercaprol Other iron products This medication may also interact with the following: Chloramphenicol Deferasirox This list may not describe all possible interactions. Give your health care provider a list of all the medicines, herbs, non-prescription drugs, or dietary supplements you use. Also tell them if you smoke, drink alcohol, or use illegal drugs. Some items may interact with  your medicine. What should I watch for while using this medication? Visit your care team regularly. Tell your care team if your symptoms do not start to get better or if they get worse. You may need blood work done while you are taking this medication. You may need to follow a special diet. Talk to your care team. Foods that contain iron include: whole grains/cereals, dried fruits, beans, or peas, leafy green vegetables, and organ meats (liver, kidney). What side effects may I notice from receiving this medication? Side effects that you should report to your care team as soon as possible: Allergic  reactions--skin rash, itching, hives, swelling of the face, lips, tongue, or throat Low blood pressure--dizziness, feeling faint or lightheaded, blurry vision Shortness of breath Side effects that usually do not require medical attention (report to your care team if they continue or are bothersome): Flushing Headache Joint pain Muscle pain Nausea Pain, redness, or irritation at injection site This list may not describe all possible side effects. Call your doctor for medical advice about side effects. You may report side effects to FDA at 1-800-FDA-1088. Where should I keep my medication? This medication is given in a hospital or clinic and will not be stored at home. NOTE: This sheet is a summary. It may not cover all possible information. If you have questions about this medicine, talk to your doctor, pharmacist, or health care provider.  2023 Elsevier/Gold Standard (2020-11-10 00:00:00)

## 2022-06-18 ENCOUNTER — Inpatient Hospital Stay: Payer: BC Managed Care – PPO

## 2022-06-18 VITALS — BP 118/66 | HR 95 | Temp 98.0°F | Resp 18 | Ht 59.0 in | Wt 134.0 lb

## 2022-06-18 DIAGNOSIS — C7951 Secondary malignant neoplasm of bone: Secondary | ICD-10-CM

## 2022-06-18 DIAGNOSIS — Z5111 Encounter for antineoplastic chemotherapy: Secondary | ICD-10-CM | POA: Diagnosis not present

## 2022-06-18 MED ORDER — SODIUM CHLORIDE 0.9 % IV SOLN
200.0000 mg | Freq: Once | INTRAVENOUS | Status: AC
  Administered 2022-06-18: 200 mg via INTRAVENOUS
  Filled 2022-06-18: qty 200

## 2022-06-18 MED ORDER — SODIUM CHLORIDE 0.9 % IV SOLN: Freq: Once | INTRAVENOUS | Status: AC

## 2022-06-19 ENCOUNTER — Encounter: Payer: Self-pay | Admitting: Oncology

## 2022-06-19 MED FILL — Iron Sucrose Inj 20 MG/ML (Fe Equiv): INTRAVENOUS | Qty: 10 | Status: AC

## 2022-06-20 ENCOUNTER — Other Ambulatory Visit: Payer: Self-pay

## 2022-06-20 ENCOUNTER — Inpatient Hospital Stay: Payer: BC Managed Care – PPO

## 2022-06-20 VITALS — BP 121/71 | HR 88 | Temp 98.0°F | Resp 18

## 2022-06-20 DIAGNOSIS — Z5111 Encounter for antineoplastic chemotherapy: Secondary | ICD-10-CM | POA: Diagnosis not present

## 2022-06-20 DIAGNOSIS — M545 Low back pain, unspecified: Secondary | ICD-10-CM

## 2022-06-20 DIAGNOSIS — R0789 Other chest pain: Secondary | ICD-10-CM

## 2022-06-20 DIAGNOSIS — C7951 Secondary malignant neoplasm of bone: Secondary | ICD-10-CM

## 2022-06-20 MED ORDER — OXYCODONE HCL 10 MG PO TABS: 10.0000 mg | ORAL_TABLET | ORAL | 0 refills | Status: DC | PRN

## 2022-06-20 MED ORDER — SODIUM CHLORIDE 0.9 % IV SOLN
200.0000 mg | Freq: Once | INTRAVENOUS | Status: AC
  Administered 2022-06-20: 200 mg via INTRAVENOUS
  Filled 2022-06-20: qty 200

## 2022-06-20 MED ORDER — SODIUM CHLORIDE 0.9 % IV SOLN: Freq: Once | INTRAVENOUS | Status: AC

## 2022-06-20 NOTE — Patient Instructions (Signed)

## 2022-06-21 NOTE — Progress Notes (Signed)
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 She was on adjuvant hormonal therapy with tamoxifen 20 mg daily from October 2015 to May 2020, but due to elevation of the liver transaminases, was switched to anastrazole.   Malignant neoplasm metastatic to bone [facility]) New bone metastases, November 2022, with multiple marrow replacing lesions within the proximal right femur within the ischium as well as the inferior rami. 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 Ki67 was 60%. PET imaging from December 19th revealed dominant finding of intensely hypermetabolic aggressive skeletal metastasis involving the calvarium, sternum, thoracic and lumbar spine, and pelvis, with potential pathologic fracture of the right femur with internal fixation. There is soft tissue extension into the anterior mediastinum associated with the manubrial metastasis. Multiple spinal vertebral body lesions have cortical destruction along the posterior margin. She started monthly Xgeva in December, and completed radiation therapy. She was receiving THP and will plan for hormonal therapy.  Cycle 4 was delayed and dose reduction made by 20% due to left leg infection. She will continue with Herceptin/ Perjeta, but we dropped the chemotherapy.   Neck pain She is now having problems with  stiffness of her neck and pain that she rates as a 7 out of 10.  She is having trouble holding her neck up. She was evaluated by Dr. Gladstone [facility]. He provided her with a cervical collar. Dr. Gladstone Lighter did not recommend MRI and patient canceled her upcoming appointment for Friday. I have encouraged her to take her extended release pain medication. She still has extensive muscle spasm and is going to physical therapy now.  Plan: She will resume Herceptin and Perjeta on 11/17 will get Xgeva every 4 weeks . She continues to go to [facility] for neck strengthening. I will see her back in 3 weeks prior to her next Herceptin with CBC and CMP.The patient understands the plans discussed today and is in agreement with them.  She knows to contact our office if she develops concerns prior to her next appointment.    Derwood Kaplan, MD  [place] [place] [address] [place] [address] Dept: [phone] Dept Fax: [phone]   No orders of the defined types were placed in this encounter.     CHIEF COMPLAINT:  CC: A 51 year old female with history of breast cancer here for 1 week evaluation  Current Treatment:  THP; will proceed with HP  INTERVAL HISTORY:  Meganne is here today for repeat clinical assessment. She reports her legs are doing better. Her shoulder pain has also improved. She rates her back pain at a 3/10. She had kyphoplasty of thoracic back. She has had 5 doses of IV Venofer.She did stop Topamax 25 mg. She states she is tolerating chemotherapy well. Her next cycle is 11/17. 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 She states she has  orthopnea at night when laying down. She continues going to physical therapy for her neck, I have also suggested to use heat. She denies fevers or chills. She denies pain. Her appetite is fair. Her weight is down 4 pounds since her last visit. She has talked to her dietician.     I have reviewed the past medical history, past surgical history, social history and family history with the patient and they are unchanged from previous note.  ALLERGIES:  has No Known Allergies.  MEDICATIONS:  Current Outpatient Medications  Medication Sig Dispense Refill   albuterol (VENTOLIN HFA) 108 (90 Base) MCG/ACT inhaler Inhale 2 puffs into the lungs every 6 (six) hours as needed for shortness of breath.     ARIPiprazole (ABILIFY) 5 MG tablet Take 2.5 mg by mouth daily.     aspirin 81 MG EC tablet Take 81 mg by mouth daily. Swallow whole.     atorvastatin (LIPITOR) 20 MG tablet TAKE ONE TABLET BY MOUTH EVERY DAY 90 tablet 0   augmented betamethasone dipropionate (DIPROLENE-AF) 0.05 % cream      calcium carbonate (OS-CAL) 1250 (500 Ca) MG chewable tablet Chew 6 tablets by mouth daily.     CLENPIQ 10-3.5-12 MG-GM -GM/160ML SOLN      desvenlafaxine (PRISTIQ) 50 MG 24 hr tablet Take 50 mg by mouth daily.     diphenoxylate-atropine (LOMOTIL) 2.5-0.025 MG tablet TAKE TWO TABLETS BY MOUTH FOUR TIMES A DAY AS NEEDED FOR DIARRHEA OR LOOSE STOOLS 100 tablet 1   FARXIGA 10 MG TABS tablet Take 1 tablet (10 mg total) by mouth daily. 30 tablet 11   furosemide (LASIX) 20 MG tablet Take 1 tablet (20 mg total) by mouth daily. 30 tablet 0   labetalol (NORMODYNE) 200 MG tablet Take 1 tablet (200 mg total) by mouth 2 (two) times daily. 180 tablet 3   Loratadine 10 MG CAPS Take 1 capsule (10 mg total) by mouth daily. 30 capsule 6   LORazepam (ATIVAN) 1 MG tablet Take 1 tablet (1 mg total) by mouth every 8 (eight) hours. 30 tablet 0   magic mouthwash (lidocaine, diphenhydrAMINE, alum & mag hydroxide) suspension Take 5 mLs by mouth every 3 (three) hours as needed. Swish and spit; for throat/mouth pain     naloxone (NARCAN) nasal spray 4 mg/0.1 mL One spray in nostril as needed, may repeat every 2 to 3 minutes until medical assistance becomes available 4 each 1   nitroGLYCERIN (NITROSTAT) 0.4 MG SL  tablet Place 0.4 mg under the tongue every 5 (five) minutes as needed for chest pain.     omeprazole (PRILOSEC) 40 MG capsule      ondansetron (ZOFRAN) 4 MG tablet Take 1 tablet (4 mg total) by mouth every 4 (four) hours as needed for nausea. 90 tablet 3   oxyCODONE ER (XTAMPZA ER) 9 MG C12A Take 2 tablets by mouth 2 (two) times daily. 120 capsule 0   Oxycodone HCl 10 MG TABS TAKE ONE TABLET BY MOUTH EVERY 4 HOURS AS NEEDED 120 tablet 0   OZEMPIC, 0.25 OR 0.5 MG/DOSE, 2 MG/3ML SOPN Inject into the skin.     potassium chloride SA (KLOR-CON M) 20 MEQ tablet Take 1 tablet (20 mEq total) by mouth 2 (two) times daily. 60 tablet 0   prochlorperazine (COMPAZINE) 10 MG tablet Take 1 tablet (10 mg total) by mouth every 6 (six) hours as needed for nausea or vomiting. 90 tablet 3   topiramate (TOPAMAX) 25 MG tablet Alternate with 50  mg daily for 1 week, then take 25 mg daily for 1 week, then take 25 mg every other day for 1 week then stop 30 tablet 0   topiramate (TOPAMAX) 50 MG tablet Take by mouth every other day.     valsartan (DIOVAN) 40 MG tablet TAKE ONE TABLET BY MOUTH EVERY DAY 90 tablet 0   Vitamin D, Ergocalciferol, (DRISDOL) 1.25 MG (50000 UNIT) CAPS capsule Take 50,000 Units by mouth once a week.     Current Facility-Administered Medications  Medication Dose Route Frequency Provider Last Rate Last Admin   0.9 %  sodium chloride infusion  500 mL Intravenous Once Jackquline Denmark, MD       technetium sestamibi generic (CARDIOLITE) injection 28.3 millicurie  15.1 millicurie Intravenous Once PRN Revankar, Reita Cliche, MD        HISTORY OF PRESENT ILLNESS:   Oncology History  Malignant neoplasm of upper-outer quadrant of left female breast ([place])  07/23/2013 Initial Diagnosis   Malignant neoplasm of upper-outer quadrant of left female breast ([place])   07/23/2013 Cancer Staging   Staging form: Breast, AJCC 7th Edition - Clinical stage from 07/23/2013: Stage IIB (T2, N1, M0) - Signed by Derwood Kaplan, MD on 07/04/2020 Prognostic indicators: Pos LVI   09/22/2021 - 04/06/2022 Chemotherapy   Patient is on Treatment Plan : BREAST  Trastuzumab + Pertuzumab q21d      09/22/2021 -  Chemotherapy   Patient is on Treatment Plan : BREAST DOCEtaxel + Trastuzumab + Pertuzumab (THP) q21d x 8 cycles / Trastuzumab + Pertuzumab q21d x 4 cycles     Malignant neoplasm metastatic to bone ([place])  06/28/2021 Initial Diagnosis   Bone metastases ([place])   09/22/2021 - 04/06/2022 Chemotherapy   Patient is on Treatment Plan : BREAST  Trastuzumab + Pertuzumab q21d      09/22/2021 -  Chemotherapy   Patient is on Treatment Plan : BREAST DOCEtaxel + Trastuzumab + Pertuzumab (THP) q21d x 8 cycles / Trastuzumab + Pertuzumab q21d x 4 cycles     05/11/2022 Imaging   CT chest, abdomen and pelvis:  IMPRESSION:  1. Widespread bony metastatic disease shows increased sclerosis  since previous imaging. This is likely related to response to  therapy in the interval. There is a single lesion along the LEFT  iliac which shows continued lytic change and warrants attention on  follow-up  2. Interval development of pathologic fractures at in the upper  thoracic spine and at the thoracolumbar junction with associated  kyphosis.  3. No signs of solid organ or nodal metastatic disease.  4. Marked elevation of the LEFT hemidiaphragm as on the prior PET.  5. Aortic atherosclerosis.        REVIEW OF SYSTEMS:   Constitutional: Denies fevers, chills or abnormal weight loss Eyes: Denies blurriness of vision Ears, nose, mouth, throat, and face: Denies mucositis or sore throat Respiratory: Denies cough, dyspnea or wheezes Cardiovascular: Denies palpitation, chest discomfort or lower extremity swelling Gastrointestinal:  Denies nausea, heartburn or change in bowel habits Skin: Denies abnormal skin rashes Lymphatics: Denies new lymphadenopathy or easy bruising Neurological:Denies numbness, tingling or new  weaknesses Behavioral/Psych: Mood is stable, no new changes  All other systems were reviewed with the patient and are negative.   VITALS:  Blood pressure 109/64, pulse 85, temperature 97.6 F (36.4 C), temperature source Oral, resp. rate 20, height _0  (1.499 m), weight 130 lb 4.8 oz (59.1 kg), last menstrual period 08/18/2013, SpO2 97 %.  Wt Readings from  Last 3 Encounters:  07/13/22 133 lb 0.6 oz (60.3 kg)  06/29/22 131 lb 0.6 oz (59.4 kg)  06/26/22 130 lb 4.8 oz (59.1 kg)    Body mass index is 26.32 kg/m.  Performance status (ECOG): 1 - Symptomatic but completely ambulatory  PHYSICAL EXAM:  Physical Exam Constitutional:      General: She is not in acute distress.    Appearance: Normal appearance. She is normal weight. She is not ill-appearing.  HENT:     Head: Normocephalic and atraumatic.  Eyes:     General: No scleral icterus.    Extraocular Movements: Extraocular movements intact.     Conjunctiva/sclera: Conjunctivae normal.     Pupils: Pupils are equal, round, and reactive to light.  Cardiovascular:     Rate and Rhythm: Normal rate and regular rhythm.     Pulses: Normal pulses.     Heart sounds: Normal heart sounds. No murmur heard.    No friction rub. No gallop.  Pulmonary:     Effort: Pulmonary effort is normal. No respiratory distress.     Breath sounds: Normal breath sounds.  Abdominal:     General: Bowel sounds are normal. There is no distension.     Palpations: Abdomen is soft. There is no hepatomegaly, splenomegaly or mass.     Tenderness: There is no abdominal tenderness.  Musculoskeletal:        General: Normal range of motion.     Cervical back: Normal range of motion and neck supple.     Right lower leg: No edema.     Left lower leg: No edema.     Comments: Legs are firm with chronic vascular changes.  Lymphadenopathy:     Cervical: No cervical adenopathy.  Skin:    General: Skin is warm and dry.  Neurological:     General: No focal deficit  present.     Mental Status: She is alert and oriented to person, place, and time. Mental status is at baseline.  Psychiatric:        Mood and Affect: Mood normal.        Behavior: Behavior normal.        Thought Content: Thought content normal.        Judgment: Judgment normal.     GENERAL:alert, no distress and comfortable SKIN: skin color, texture, turgor are normal, no rashes or significant lesions EYES: normal, Conjunctiva are pink and non-injected, sclera clear OROPHARYNX:no exudate, no erythema and lips, buccal mucosa, and tongue normal  NECK: supple, thyroid normal size, non-tender, without nodularity LYMPH:  no palpable lymphadenopathy in the cervical, axillary or inguinal LUNGS: clear to auscultation and percussion with normal breathing effort HEART: regular rate & rhythm and no murmurs and no lower extremity edema ABDOMEN:abdomen soft, non-tender and normal bowel sounds Musculoskeletal:no cyanosis of digits and no clubbing  NEURO: alert & oriented x 3 with fluent speech, no focal motor/sensory deficits  LABORATORY DATA:  I have reviewed the data as listed    Component Value Date/Time   NA 142 07/11/2022 1116   NA 138 06/05/2022 0000   K 3.2 (L) 07/11/2022 1116   CL 106 07/11/2022 1116   CO2 28 07/11/2022 1116   GLUCOSE 117 (H) 07/11/2022 1116   BUN 13 07/11/2022 1116   BUN 18 06/05/2022 0000   CREATININE 0.65 07/11/2022 1116   CALCIUM 8.9 07/11/2022 1116   PROT 7.4 07/11/2022 1116   ALBUMIN 3.5 07/11/2022 1116   AST 42 (H) 07/11/2022 1116  ALT 54 (H) 07/11/2022 1116   ALKPHOS 301 (H) 07/11/2022 1116   BILITOT 0.3 07/11/2022 1116   GFRNONAA >60 07/11/2022 1116    No results found for: "SPEP", "UPEP"  Lab Results  Component Value Date   WBC 6.5 07/11/2022   NEUTROABS 4.5 07/11/2022   HGB 10.5 (L) 07/11/2022   HCT 36.1 07/11/2022   MCV 84.3 07/11/2022   PLT 396 07/11/2022      Chemistry      Component Value Date/Time   NA 142 07/11/2022 1116   NA  138 06/05/2022 0000   K 3.2 (L) 07/11/2022 1116   CL 106 07/11/2022 1116   CO2 28 07/11/2022 1116   BUN 13 07/11/2022 1116   BUN 18 06/05/2022 0000   CREATININE 0.65 07/11/2022 1116   GLU 117 06/05/2022 0000      Component Value Date/Time   CALCIUM 8.9 07/11/2022 1116   ALKPHOS 301 (H) 07/11/2022 1116   AST 42 (H) 07/11/2022 1116   ALT 54 (H) 07/11/2022 1116   BILITOT 0.3 07/11/2022 1116       RADIOGRAPHIC STUDIES: I have personally reviewed the radiological images as listed and agreed with the findings in the report. No results found.     I,Gabriella Ballesteros,acting as a scribe for Derwood Kaplan, MD.,have documented all relevant documentation on the behalf of Derwood Kaplan, MD,as directed by  Derwood Kaplan, MD while in the presence of Derwood Kaplan, MD.

## 2022-06-26 ENCOUNTER — Encounter: Payer: Self-pay | Admitting: Oncology

## 2022-06-26 ENCOUNTER — Inpatient Hospital Stay (INDEPENDENT_AMBULATORY_CARE_PROVIDER_SITE_OTHER): Payer: BC Managed Care – PPO | Admitting: Oncology

## 2022-06-26 ENCOUNTER — Inpatient Hospital Stay: Payer: BC Managed Care – PPO

## 2022-06-26 DIAGNOSIS — C7951 Secondary malignant neoplasm of bone: Secondary | ICD-10-CM

## 2022-06-26 DIAGNOSIS — Z5111 Encounter for antineoplastic chemotherapy: Secondary | ICD-10-CM | POA: Diagnosis not present

## 2022-06-26 DIAGNOSIS — C50412 Malignant neoplasm of upper-outer quadrant of left female breast: Secondary | ICD-10-CM

## 2022-06-26 DIAGNOSIS — Z170 Estrogen receptor positive status [ER+]: Secondary | ICD-10-CM | POA: Diagnosis not present

## 2022-06-26 LAB — COMPREHENSIVE METABOLIC PANEL
ALT: 27 U/L (ref 0–44)
AST: 27 U/L (ref 15–41)
Albumin: 3.7 g/dL (ref 3.5–5.0)
Alkaline Phosphatase: 166 U/L — ABNORMAL HIGH (ref 38–126)
Anion gap: 6 (ref 5–15)
BUN: 16 mg/dL (ref 6–20)
CO2: 30 mmol/L (ref 22–32)
Calcium: 9.4 mg/dL (ref 8.9–10.3)
Chloride: 106 mmol/L (ref 98–111)
Creatinine, Ser: 0.64 mg/dL (ref 0.44–1.00)
GFR, Estimated: >60 mL/min (ref >60)
Glucose, Bld: 129 mg/dL — ABNORMAL HIGH (ref 70–99)
Potassium: 3.9 mmol/L (ref 3.5–5.1)
Sodium: 142 mmol/L (ref 135–145)
Total Bilirubin: 0.4 mg/dL (ref 0.3–1.2)
Total Protein: 7.6 g/dL (ref 6.5–8.1)

## 2022-06-26 LAB — CBC WITH DIFFERENTIAL/PLATELET
Abs Immature Granulocytes: 0.02 10*3/uL (ref 0.00–0.07)
Basophils Absolute: 0 10*3/uL (ref 0.0–0.1)
Basophils Relative: 1 %
Eosinophils Absolute: 0.2 10*3/uL (ref 0.0–0.5)
Eosinophils Relative: 4 %
HCT: 38.9 % (ref 36.0–46.0)
Hemoglobin: 11.3 g/dL — ABNORMAL LOW (ref 12.0–15.0)
Immature Granulocytes: 0 %
Lymphocytes Relative: 19 %
Lymphs Abs: 1 10*3/uL (ref 0.7–4.0)
MCH: 24.5 pg — ABNORMAL LOW (ref 26.0–34.0)
MCHC: 29 g/dL — ABNORMAL LOW (ref 30.0–36.0)
MCV: 84.4 fL (ref 80.0–100.0)
Monocytes Absolute: 0.4 10*3/uL (ref 0.1–1.0)
Monocytes Relative: 8 %
Neutro Abs: 3.7 10*3/uL (ref 1.7–7.7)
Neutrophils Relative %: 68 %
Platelets: 350 10*3/uL (ref 150–400)
RBC: 4.61 MIL/uL (ref 3.87–5.11)
RDW: 18.8 % — ABNORMAL HIGH (ref 11.5–15.5)
WBC: 5.3 10*3/uL (ref 4.0–10.5)
nRBC: 0 % (ref 0.0–0.2)

## 2022-06-26 LAB — MAGNESIUM: Magnesium: 2.2 mg/dL (ref 1.7–2.4)

## 2022-06-26 NOTE — Progress Notes (Signed)
Face to face with pt and husband in lobby. Pt reports that she is doing okay. Admits that she hurts most of the time but just keeps going. Pt remains on Herceptin/Perjetta. No needs voiced at present time.

## 2022-06-27 ENCOUNTER — Ambulatory Visit: Payer: BC Managed Care – PPO

## 2022-06-27 ENCOUNTER — Other Ambulatory Visit: Payer: Self-pay

## 2022-06-28 ENCOUNTER — Other Ambulatory Visit: Payer: Self-pay | Admitting: Oncology

## 2022-06-28 ENCOUNTER — Other Ambulatory Visit: Payer: Self-pay

## 2022-06-28 DIAGNOSIS — Z5111 Encounter for antineoplastic chemotherapy: Secondary | ICD-10-CM | POA: Diagnosis not present

## 2022-06-28 DIAGNOSIS — C7951 Secondary malignant neoplasm of bone: Secondary | ICD-10-CM

## 2022-06-28 MED FILL — Trastuzumab-dkst For IV Soln 150 MG: INTRAVENOUS | Qty: 17 | Status: AC

## 2022-06-28 MED FILL — Pertuzumab Soln for IV Infusion 420 MG/14ML (30 MG/ML): INTRAVENOUS | Qty: 14 | Status: AC

## 2022-06-29 ENCOUNTER — Inpatient Hospital Stay: Payer: BC Managed Care – PPO

## 2022-06-29 VITALS — BP 98/57 | HR 92 | Temp 99.0°F | Resp 14 | Ht 59.0 in | Wt 131.0 lb

## 2022-06-29 DIAGNOSIS — C7951 Secondary malignant neoplasm of bone: Secondary | ICD-10-CM

## 2022-06-29 DIAGNOSIS — Z170 Estrogen receptor positive status [ER+]: Secondary | ICD-10-CM

## 2022-06-29 DIAGNOSIS — Z5111 Encounter for antineoplastic chemotherapy: Secondary | ICD-10-CM | POA: Diagnosis not present

## 2022-06-29 LAB — CANCER ANTIGEN 27.29: CA 27.29: 50.9 U/mL — ABNORMAL HIGH (ref 0.0–38.6)

## 2022-06-29 MED ORDER — TRASTUZUMAB-DKST CHEMO 150 MG IV SOLR
6.0000 mg/kg | Freq: Once | INTRAVENOUS | Status: AC
  Administered 2022-06-29: 357 mg via INTRAVENOUS
  Filled 2022-06-29: qty 17

## 2022-06-29 MED ORDER — DIPHENHYDRAMINE HCL 25 MG PO CAPS
25.0000 mg | ORAL_CAPSULE | Freq: Once | ORAL | Status: DC
  Filled 2022-06-29: qty 1

## 2022-06-29 MED ORDER — ACETAMINOPHEN 325 MG PO TABS
650.0000 mg | ORAL_TABLET | Freq: Once | ORAL | Status: DC
  Filled 2022-06-29: qty 2

## 2022-06-29 MED ORDER — HEPARIN SOD (PORK) LOCK FLUSH 100 UNIT/ML IV SOLN
500.0000 [IU] | Freq: Once | INTRAVENOUS | Status: AC | PRN
  Administered 2022-06-29: 500 [IU]

## 2022-06-29 MED ORDER — SODIUM CHLORIDE 0.9 % IV SOLN
420.0000 mg | Freq: Once | INTRAVENOUS | Status: AC
  Administered 2022-06-29: 420 mg via INTRAVENOUS
  Filled 2022-06-29: qty 14

## 2022-06-29 MED ORDER — SODIUM CHLORIDE 0.9% FLUSH
10.0000 mL | INTRAVENOUS | Status: DC | PRN
  Administered 2022-06-29: 10 mL

## 2022-06-29 MED ORDER — SODIUM CHLORIDE 0.9 % IV SOLN: Freq: Once | INTRAVENOUS | Status: AC

## 2022-06-29 NOTE — Patient Instructions (Signed)
Pertuzumab Injection What is this medication? PERTUZUMAB (per TOOZ ue mab) treats breast cancer. It works by blocking a protein that causes cancer cells to grow and multiply. This helps to slow or stop the spread of cancer cells. It is a monoclonal antibody. This medicine may be used for other purposes; ask your health care provider or pharmacist if you have questions. COMMON BRAND NAME(S): PERJETA What should I tell my care team before I take this medication? They need to know if you have any of these conditions: Heart failure An unusual or allergic reaction to pertuzumab, other medications, foods, dyes, or preservatives Pregnant or trying to get pregnant Breast-feeding How should I use this medication? This medication is injected into a vein. It is given by your care team in a hospital or clinic setting. Talk to your care team about the use of this medication in children. Special care may be needed. Overdosage: If you think you have taken too much of this medicine contact a poison control center or emergency room at once. NOTE: This medicine is only for you. Do not share this medicine with others. What if I miss a dose? Keep appointments for follow-up doses. It is important not to miss your dose. Call your care team if you are unable to keep an appointment. What may interact with this medication? Interactions are not expected. This list may not describe all possible interactions. Give your health care provider a list of all the medicines, herbs, non-prescription drugs, or dietary supplements you use. Also tell them if you smoke, drink alcohol, or use illegal drugs. Some items may interact with your medicine. What should I watch for while using this medication? Your condition will be monitored carefully while you are receiving this medication. This medication may make you feel generally unwell. This is not uncommon as chemotherapy can affect healthy cells as well as cancer cells. Report any  side effects. Continue your course of treatment even though you feel ill unless your care team tells you to stop. Talk to your care team if you may be pregnant. Serious birth defects can occur if you take this medication during pregnancy and for 7 months after the last dose. You will need a negative pregnancy test before starting this medication. Contraception is recommended while taking this medication and for 7 months after the last dose. Your care team can help you find the option that works for you. Do not breastfeed while taking this medication and for 7 months after the last dose. What side effects may I notice from receiving this medication? Side effects that you should report to your care team as soon as possible: Allergic reactions or angioedema--skin rash, itching or hives, swelling of the face, eyes, lips, tongue, arms, or legs, trouble swallowing or breathing Heart failure--shortness of breath, swelling of the ankles, feet, or hands, sudden weight gain, unusual weakness or fatigue Infusion reactions--chest pain, shortness of breath or trouble breathing, feeling faint or lightheaded Side effects that usually do not require medical attention (report to your care team if they continue or are bothersome): Diarrhea Dry skin Fatigue Hair loss Nausea Vomiting This list may not describe all possible side effects. Call your doctor for medical advice about side effects. You may report side effects to FDA at 1-800-FDA-1088. Where should I keep my medication? This medication is given in a hospital or clinic. It will not be stored at home. NOTE: This sheet is a summary. It may not cover all possible information. If you have  questions about this medicine, talk to your doctor, pharmacist, or health care provider.  2023 Elsevier/Gold Standard (2021-12-12 00:00:00) Trastuzumab Injection What is this medication? TRASTUZUMAB (tras TOO zoo mab) treats breast cancer and stomach cancer. It works by  blocking a protein that causes cancer cells to grow and multiply. This helps to slow or stop the spread of cancer cells. This medicine may be used for other purposes; ask your health care provider or pharmacist if you have questions. COMMON BRAND NAME(S): Herceptin, Janae Bridgeman, Ontruzant, Trazimera What should I tell my care team before I take this medication? They need to know if you have any of these conditions: Heart failure Lung disease An unusual or allergic reaction to trastuzumab, other medications, foods, dyes, or preservatives Pregnant or trying to get pregnant Breast-feeding How should I use this medication? This medication is injected into a vein. It is given by your care team in a hospital or clinic setting. Talk to your care team about the use of this medication in children. It is not approved for use in children. Overdosage: If you think you have taken too much of this medicine contact a poison control center or emergency room at once. NOTE: This medicine is only for you. Do not share this medicine with others. What if I miss a dose? Keep appointments for follow-up doses. It is important not to miss your dose. Call your care team if you are unable to keep an appointment. What may interact with this medication? Certain types of chemotherapy, such as daunorubicin, doxorubicin, epirubicin, idarubicin This list may not describe all possible interactions. Give your health care provider a list of all the medicines, herbs, non-prescription drugs, or dietary supplements you use. Also tell them if you smoke, drink alcohol, or use illegal drugs. Some items may interact with your medicine. What should I watch for while using this medication? Your condition will be monitored carefully while you are receiving this medication. This medication may make you feel generally unwell. This is not uncommon, as chemotherapy affects healthy cells as well as cancer cells. Report any side  effects. Continue your course of treatment even though you feel ill unless your care team tells you to stop. This medication may increase your risk of getting an infection. Call your care team for advice if you get a fever, chills, sore throat, or other symptoms of a cold or flu. Do not treat yourself. Try to avoid being around people who are sick. Avoid taking medications that contain aspirin, acetaminophen, ibuprofen, naproxen, or ketoprofen unless instructed by your care team. These medications can hide a fever. Talk to your care team if you may be pregnant. Serious birth defects can occur if you take this medication during pregnancy and for 7 months after the last dose. You will need a negative pregnancy test before starting this medication. Contraception is recommended while taking this medication and for 7 months after the last dose. Your care team can help you find the option that works for you. Do not breastfeed while taking this medication and for 7 months after stopping treatment. What side effects may I notice from receiving this medication? Side effects that you should report to your care team as soon as possible: Allergic reactions or angioedema--skin rash, itching or hives, swelling of the face, eyes, lips, tongue, arms, or legs, trouble swallowing or breathing Dry cough, shortness of breath or trouble breathing Heart failure--shortness of breath, swelling of the ankles, feet, or hands, sudden weight gain, unusual weakness  or fatigue Infection--fever, chills, cough, or sore throat Infusion reactions--chest pain, shortness of breath or trouble breathing, feeling faint or lightheaded Side effects that usually do not require medical attention (report to your care team if they continue or are bothersome): Diarrhea Dizziness Headache Nausea Trouble sleeping Vomiting This list may not describe all possible side effects. Call your doctor for medical advice about side effects. You may report  side effects to FDA at 1-800-FDA-1088. Where should I keep my medication? This medication is given in a hospital or clinic. It will not be stored at home. NOTE: This sheet is a summary. It may not cover all possible information. If you have questions about this medicine, talk to your doctor, pharmacist, or health care provider.  2023 Elsevier/Gold Standard (2021-11-30 00:00:00)

## 2022-07-03 ENCOUNTER — Telehealth: Payer: Self-pay

## 2022-07-03 NOTE — Telephone Encounter (Signed)
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

## 2022-07-07 ENCOUNTER — Other Ambulatory Visit: Payer: Self-pay | Admitting: Cardiology

## 2022-07-09 NOTE — Telephone Encounter (Signed)
Rx refill sent to pharmacy. 

## 2022-07-10 ENCOUNTER — Encounter: Payer: Self-pay | Admitting: Oncology

## 2022-07-11 ENCOUNTER — Inpatient Hospital Stay: Payer: BC Managed Care – PPO

## 2022-07-11 DIAGNOSIS — Z5111 Encounter for antineoplastic chemotherapy: Secondary | ICD-10-CM | POA: Diagnosis not present

## 2022-07-11 DIAGNOSIS — C7951 Secondary malignant neoplasm of bone: Secondary | ICD-10-CM

## 2022-07-11 DIAGNOSIS — C50412 Malignant neoplasm of upper-outer quadrant of left female breast: Secondary | ICD-10-CM

## 2022-07-11 LAB — CMP (CANCER CENTER ONLY)
ALT: 54 U/L — ABNORMAL HIGH (ref 0–44)
AST: 42 U/L — ABNORMAL HIGH (ref 15–41)
Albumin: 3.5 g/dL (ref 3.5–5.0)
Alkaline Phosphatase: 301 U/L — ABNORMAL HIGH (ref 38–126)
Anion gap: 8 (ref 5–15)
BUN: 13 mg/dL (ref 6–20)
CO2: 28 mmol/L (ref 22–32)
Calcium: 8.9 mg/dL (ref 8.9–10.3)
Chloride: 106 mmol/L (ref 98–111)
Creatinine: 0.65 mg/dL (ref 0.44–1.00)
GFR, Estimated: >60 mL/min (ref >60)
Glucose, Bld: 117 mg/dL — ABNORMAL HIGH (ref 70–99)
Potassium: 3.2 mmol/L — ABNORMAL LOW (ref 3.5–5.1)
Sodium: 142 mmol/L (ref 135–145)
Total Bilirubin: 0.3 mg/dL (ref 0.3–1.2)
Total Protein: 7.4 g/dL (ref 6.5–8.1)

## 2022-07-11 LAB — MAGNESIUM: Magnesium: 2 mg/dL (ref 1.7–2.4)

## 2022-07-11 LAB — CBC WITH DIFFERENTIAL (CANCER CENTER ONLY)
Abs Immature Granulocytes: 0.02 10*3/uL (ref 0.00–0.07)
Basophils Absolute: 0 10*3/uL (ref 0.0–0.1)
Basophils Relative: 1 %
Eosinophils Absolute: 0.3 10*3/uL (ref 0.0–0.5)
Eosinophils Relative: 4 %
HCT: 36.1 % (ref 36.0–46.0)
Hemoglobin: 10.5 g/dL — ABNORMAL LOW (ref 12.0–15.0)
Immature Granulocytes: 0 %
Lymphocytes Relative: 16 %
Lymphs Abs: 1.1 10*3/uL (ref 0.7–4.0)
MCH: 24.5 pg — ABNORMAL LOW (ref 26.0–34.0)
MCHC: 29.1 g/dL — ABNORMAL LOW (ref 30.0–36.0)
MCV: 84.3 fL (ref 80.0–100.0)
Monocytes Absolute: 0.6 10*3/uL (ref 0.1–1.0)
Monocytes Relative: 9 %
Neutro Abs: 4.5 10*3/uL (ref 1.7–7.7)
Neutrophils Relative %: 70 %
Platelet Count: 396 10*3/uL (ref 150–400)
RBC: 4.28 MIL/uL (ref 3.87–5.11)
RDW: 18.1 % — ABNORMAL HIGH (ref 11.5–15.5)
WBC Count: 6.5 10*3/uL (ref 4.0–10.5)
nRBC: 0 % (ref 0.0–0.2)

## 2022-07-13 ENCOUNTER — Telehealth: Payer: Self-pay | Admitting: Pharmacist

## 2022-07-13 ENCOUNTER — Other Ambulatory Visit: Payer: Self-pay | Admitting: Hematology and Oncology

## 2022-07-13 ENCOUNTER — Inpatient Hospital Stay: Payer: BC Managed Care – PPO | Attending: Hematology and Oncology

## 2022-07-13 ENCOUNTER — Telehealth: Payer: Self-pay

## 2022-07-13 VITALS — BP 111/82 | HR 93 | Temp 98.3°F | Resp 14 | Ht 59.0 in | Wt 133.0 lb

## 2022-07-13 DIAGNOSIS — Z5111 Encounter for antineoplastic chemotherapy: Secondary | ICD-10-CM | POA: Diagnosis present

## 2022-07-13 DIAGNOSIS — C7951 Secondary malignant neoplasm of bone: Secondary | ICD-10-CM | POA: Diagnosis not present

## 2022-07-13 DIAGNOSIS — D649 Anemia, unspecified: Secondary | ICD-10-CM | POA: Diagnosis not present

## 2022-07-13 DIAGNOSIS — R0789 Other chest pain: Secondary | ICD-10-CM

## 2022-07-13 DIAGNOSIS — C50412 Malignant neoplasm of upper-outer quadrant of left female breast: Secondary | ICD-10-CM | POA: Diagnosis not present

## 2022-07-13 DIAGNOSIS — M542 Cervicalgia: Secondary | ICD-10-CM | POA: Diagnosis not present

## 2022-07-13 DIAGNOSIS — Z923 Personal history of irradiation: Secondary | ICD-10-CM | POA: Diagnosis not present

## 2022-07-13 DIAGNOSIS — Z96641 Presence of right artificial hip joint: Secondary | ICD-10-CM | POA: Diagnosis not present

## 2022-07-13 DIAGNOSIS — E876 Hypokalemia: Secondary | ICD-10-CM | POA: Insufficient documentation

## 2022-07-13 DIAGNOSIS — Z79899 Other long term (current) drug therapy: Secondary | ICD-10-CM | POA: Insufficient documentation

## 2022-07-13 DIAGNOSIS — M545 Low back pain, unspecified: Secondary | ICD-10-CM

## 2022-07-13 DIAGNOSIS — Z79891 Long term (current) use of opiate analgesic: Secondary | ICD-10-CM | POA: Insufficient documentation

## 2022-07-13 DIAGNOSIS — Z5112 Encounter for antineoplastic immunotherapy: Secondary | ICD-10-CM | POA: Insufficient documentation

## 2022-07-13 DIAGNOSIS — L98491 Non-pressure chronic ulcer of skin of other sites limited to breakdown of skin: Secondary | ICD-10-CM | POA: Diagnosis not present

## 2022-07-13 DIAGNOSIS — G893 Neoplasm related pain (acute) (chronic): Secondary | ICD-10-CM | POA: Diagnosis not present

## 2022-07-13 DIAGNOSIS — Z170 Estrogen receptor positive status [ER+]: Secondary | ICD-10-CM | POA: Insufficient documentation

## 2022-07-13 MED ORDER — DIPHENHYDRAMINE HCL 50 MG/ML IJ SOLN: 25.0000 mg | Freq: Once | INTRAMUSCULAR | Status: DC | PRN

## 2022-07-13 MED ORDER — EPINEPHRINE 0.3 MG/0.3ML IJ SOAJ: 0.3000 mg | Freq: Once | INTRAMUSCULAR | Status: DC | PRN

## 2022-07-13 MED ORDER — DENOSUMAB 120 MG/1.7ML ~~LOC~~ SOLN
120.0000 mg | Freq: Once | SUBCUTANEOUS | Status: AC
  Administered 2022-07-13: 120 mg via SUBCUTANEOUS
  Filled 2022-07-13: qty 1.7

## 2022-07-13 MED ORDER — SODIUM CHLORIDE 0.9 % IV SOLN: Freq: Once | INTRAVENOUS | Status: DC | PRN

## 2022-07-13 MED ORDER — METHYLPREDNISOLONE SODIUM SUCC 125 MG IJ SOLR: 125.0000 mg | Freq: Once | INTRAMUSCULAR | Status: DC | PRN

## 2022-07-13 MED ORDER — DIPHENHYDRAMINE HCL 50 MG/ML IJ SOLN: 50.0000 mg | Freq: Once | INTRAMUSCULAR | Status: DC | PRN

## 2022-07-13 MED ORDER — ALBUTEROL SULFATE (2.5 MG/3ML) 0.083% IN NEBU: 2.5000 mg | INHALATION_SOLUTION | Freq: Once | RESPIRATORY_TRACT | Status: DC | PRN

## 2022-07-13 NOTE — Telephone Encounter (Signed)
-----   Message from [name], PA-C sent at 07/13/2022 10:43 AM EST ----- Regarding: RE: refill Rx sent. Refill request was in the system, but I couldn't see it. Levada Dy had to send it to me. I don't know how this works. ----- Message ----- From: Daryel November, LPN Sent: 37/03/5884  10:13 AM EST To: [name], PA-C Subject: refill                                         Patient [facility] oxycodone 107m [facility] No one here is aware how to get to scripts  Amy may have or forward them sorry.

## 2022-07-13 NOTE — Telephone Encounter (Signed)
Spoke to patient to inform her of low potassium of 3.2 and to request that she begin taking her potassium BID again.  Her labs will be rechecked on 12/6.  She agreed to resume and said that she has plenty on hand and does not need a refill.

## 2022-07-13 NOTE — Telephone Encounter (Signed)
Patient notified script called in.

## 2022-07-14 ENCOUNTER — Encounter: Payer: Self-pay | Admitting: Oncology

## 2022-07-16 ENCOUNTER — Ambulatory Visit: Payer: BC Managed Care – PPO | Attending: Hematology and Oncology

## 2022-07-16 DIAGNOSIS — Z0189 Encounter for other specified special examinations: Secondary | ICD-10-CM | POA: Diagnosis not present

## 2022-07-16 DIAGNOSIS — Z170 Estrogen receptor positive status [ER+]: Secondary | ICD-10-CM

## 2022-07-16 DIAGNOSIS — C50412 Malignant neoplasm of upper-outer quadrant of left female breast: Secondary | ICD-10-CM

## 2022-07-16 LAB — ECHOCARDIOGRAM COMPLETE
Area-P 1/2: 5.16 cm2
Calc EF: 47.4 %
S' Lateral: 2.8 cm
Single Plane A2C EF: 50.9 %
Single Plane A4C EF: 43 %

## 2022-07-16 MED ORDER — PERFLUTREN LIPID MICROSPHERE
1.0000 mL | INTRAVENOUS | Status: AC | PRN
  Administered 2022-07-16: 6 mL via INTRAVENOUS

## 2022-07-18 ENCOUNTER — Ambulatory Visit: Payer: BC Managed Care – PPO

## 2022-07-18 ENCOUNTER — Other Ambulatory Visit: Payer: Self-pay

## 2022-07-18 ENCOUNTER — Inpatient Hospital Stay: Payer: BC Managed Care – PPO

## 2022-07-18 ENCOUNTER — Encounter: Payer: Self-pay | Admitting: Oncology

## 2022-07-18 ENCOUNTER — Inpatient Hospital Stay (INDEPENDENT_AMBULATORY_CARE_PROVIDER_SITE_OTHER): Payer: BC Managed Care – PPO | Admitting: Oncology

## 2022-07-18 ENCOUNTER — Other Ambulatory Visit: Payer: Self-pay | Admitting: Hematology and Oncology

## 2022-07-18 ENCOUNTER — Other Ambulatory Visit: Payer: Self-pay | Admitting: Oncology

## 2022-07-18 VITALS — BP 109/76 | HR 99 | Temp 99.4°F | Resp 18 | Ht 59.0 in | Wt 131.5 lb

## 2022-07-18 DIAGNOSIS — C7951 Secondary malignant neoplasm of bone: Secondary | ICD-10-CM

## 2022-07-18 DIAGNOSIS — Z170 Estrogen receptor positive status [ER+]: Secondary | ICD-10-CM

## 2022-07-18 DIAGNOSIS — Z5111 Encounter for antineoplastic chemotherapy: Secondary | ICD-10-CM | POA: Diagnosis not present

## 2022-07-18 DIAGNOSIS — C50412 Malignant neoplasm of upper-outer quadrant of left female breast: Secondary | ICD-10-CM | POA: Diagnosis not present

## 2022-07-18 LAB — CBC WITH DIFFERENTIAL/PLATELET
Abs Immature Granulocytes: 0.03 10*3/uL (ref 0.00–0.07)
Basophils Absolute: 0 10*3/uL (ref 0.0–0.1)
Basophils Relative: 0 %
Eosinophils Absolute: 0.2 10*3/uL (ref 0.0–0.5)
Eosinophils Relative: 3 %
HCT: 36.1 % (ref 36.0–46.0)
Hemoglobin: 10.4 g/dL — ABNORMAL LOW (ref 12.0–15.0)
Immature Granulocytes: 0 %
Lymphocytes Relative: 16 %
Lymphs Abs: 1.1 10*3/uL (ref 0.7–4.0)
MCH: 24.2 pg — ABNORMAL LOW (ref 26.0–34.0)
MCHC: 28.8 g/dL — ABNORMAL LOW (ref 30.0–36.0)
MCV: 84 fL (ref 80.0–100.0)
Monocytes Absolute: 0.6 10*3/uL (ref 0.1–1.0)
Monocytes Relative: 9 %
Neutro Abs: 4.8 10*3/uL (ref 1.7–7.7)
Neutrophils Relative %: 72 %
Platelets: 443 10*3/uL — ABNORMAL HIGH (ref 150–400)
RBC: 4.3 MIL/uL (ref 3.87–5.11)
RDW: 18 % — ABNORMAL HIGH (ref 11.5–15.5)
WBC: 6.7 10*3/uL (ref 4.0–10.5)
nRBC: 0 % (ref 0.0–0.2)

## 2022-07-18 LAB — COMPREHENSIVE METABOLIC PANEL
ALT: 36 U/L (ref 0–44)
AST: 24 U/L (ref 15–41)
Albumin: 3.6 g/dL (ref 3.5–5.0)
Alkaline Phosphatase: 273 U/L — ABNORMAL HIGH (ref 38–126)
Anion gap: 8 (ref 5–15)
BUN: 13 mg/dL (ref 6–20)
CO2: 30 mmol/L (ref 22–32)
Calcium: 8.8 mg/dL — ABNORMAL LOW (ref 8.9–10.3)
Chloride: 105 mmol/L (ref 98–111)
Creatinine, Ser: 0.6 mg/dL (ref 0.44–1.00)
GFR, Estimated: >60 mL/min (ref >60)
Glucose, Bld: 86 mg/dL (ref 70–99)
Potassium: 3.7 mmol/L (ref 3.5–5.1)
Sodium: 143 mmol/L (ref 135–145)
Total Bilirubin: 0.3 mg/dL (ref 0.3–1.2)
Total Protein: 7.2 g/dL (ref 6.5–8.1)

## 2022-07-18 LAB — MAGNESIUM: Magnesium: 2 mg/dL (ref 1.7–2.4)

## 2022-07-18 MED ORDER — AMOXICILLIN 500 MG PO TABS: 2000.0000 mg | ORAL_TABLET | Freq: Once | ORAL | 0 refills | Status: DC

## 2022-07-18 NOTE — Progress Notes (Signed)
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 She was on adjuvant hormonal therapy with tamoxifen 20 mg daily from October 2015 to May 2020, but due to elevation of the liver transaminases, was switched to anastrazole.   Malignant neoplasm metastatic to bone [facility]) New bone metastases, November 2022, with multiple marrow replacing lesions within the proximal right femur within the ischium as well as the inferior rami. 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 Ki67 was 60%. PET imaging from December 19th revealed dominant finding of intensely hypermetabolic aggressive skeletal metastasis involving the calvarium, sternum, thoracic and lumbar spine, and pelvis, with potential pathologic fracture of the right femur with internal fixation. There is soft tissue extension into the anterior mediastinum associated with the manubrial metastasis. Multiple spinal vertebral body lesions have cortical destruction along the posterior margin. She started monthly Xgeva in December, and completed radiation therapy. She was receiving THP and will plan for hormonal therapy.  Cycle 4 was delayed and dose reduction made by 20% due to left leg infection. She will continue with Herceptin/ Perjeta, but we dropped the chemotherapy. Her CA 27.29 decreased from 106.6 in  January of 2023 to 50.9 in November of 2023. She has borderline hypocalcemia at 8.8, so we will need to increase her supplement.  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  The last hemoglobin was stable at 10.5.  Neck pain She had problems with stiffness of her neck and pain earlier this year, that she rated as a 5 out of 10.  She was having trouble holding her neck up. She was evaluated by Dr. Gladstone [facility]. He provided her with a cervical collar. Dr. Gladstone Lighter did not recommend MRI and patient canceled that.  I have encouraged her to take her extended release pain medication. She is going to physical therapy and slowly steadily improving now.  Plan:  She will continue with Herceptin and Perjeta on 12/08 will get Xgeva every 4 weeks . She continues to go to [facility] for [facility] strengthening. I will see her back in 3 weeks prior to her next Herceptin with CBC and CMP. Now that she has been off chemotherapy, I will add in hormonal therapy as  her ER/PR were quite positive at 95/40%. I will plan letrozole 2.5 mg daily and explain this to her, as well as potential toxicities. She has previously been treated with tamoxifen for 5 years and anastrazole for 2 1/2 years, which is when her bone metastases were diagnosed.  I will prescribe her with Ampicillin 561m before her upcoming teeth cleaning. The patient understands the plans discussed today and is in agreement with them.  She knows to contact our office if she develops concerns prior to her next appointment.    CDerwood Kaplan MD  [place]  [address] Dept: [phone] Dept Fax: [phone]   No orders of the defined types were placed in this encounter.     CHIEF COMPLAINT:  CC: A 51 y.o. female with history of breast cancer here for 1 week evaluation  Current Treatment:   Maintenance HP, denosumab, and will add hormonal therapy  INTERVAL HISTORY  Yoana is here today for repeat clinical assessment for Malignant neoplasm of upper-outer quadrant of left female breast, widely metastatic to bone since November, 2022. She was treated with surgery to the hip, radiotherapy, intensive chemotherapy, and now HER2 directed therapy. Overall she feels that she is doing a bit better. She reports her appetite isn't bad but it is not the best it has been. Her bone pain is a 5/10 and she feels that her neck pain has gotten better with PT.  She currently takes 40 mEq potassium pills a day that I recommend she continue. I will prescribe her with Ampicillin 56m before her upcoming teeth cleaning. Her LFTs have been elevated recently but today's CMP is still pending. She states she is tolerating chemotherapy well. Her next cycle is 12/8. She denies fevers or chills. She denies pain. Her weight has been stable x2 weeks. She complains of her eyes tearing and her nose running constantly. This is a toxicity from her treatment so I have recommended Zyrtec. Her CA 27.29 has decreased from 106.6 in January of 2023 to 50.9 in November.  We need to add in hormonal therapy now.   I have reviewed the past medical history, past surgical history, social history and family history with the patient and they are unchanged from previous note.  ALLERGIES:  has No Known Allergies.  MEDICATIONS:  Current Outpatient Medications  Medication Sig Dispense Refill   albuterol (VENTOLIN HFA) 108 (90 Base) MCG/ACT inhaler Inhale 2 puffs into the lungs every 6 (six) hours as needed for shortness of breath.     ARIPiprazole (ABILIFY) 5 MG tablet Take 2.5 mg by mouth daily.     aspirin 81 MG EC tablet Take 81 mg by mouth daily. Swallow whole.     atorvastatin (LIPITOR) 20 MG tablet TAKE ONE TABLET BY MOUTH EVERY DAY 90 tablet 0   augmented betamethasone dipropionate (DIPROLENE-AF) 0.05 % cream      calcium  carbonate (OS-CAL) 1250 (500 Ca) MG chewable tablet Chew 6 tablets by mouth daily.     CLENPIQ 10-3.5-12 MG-GM -GM/160ML SOLN      desvenlafaxine (PRISTIQ) 50 MG 24 hr tablet Take 50 mg by mouth daily.     diphenoxylate-atropine (LOMOTIL) 2.5-0.025 MG tablet TAKE TWO TABLETS BY MOUTH FOUR TIMES A DAY AS NEEDED FOR DIARRHEA OR LOOSE STOOLS 100 tablet 1   FARXIGA 10 MG TABS tablet Take 1 tablet (10 mg total) by mouth daily. 30 tablet 11   furosemide (LASIX) 20 MG tablet Take 1 tablet (20 mg total) by mouth daily. 30 tablet 0   labetalol (NORMODYNE) 200 MG tablet Take 1 tablet (200 mg total) by mouth 2 (two) times daily. 180 tablet 3   Loratadine 10 MG CAPS Take 1 capsule (10 mg total) by mouth daily. 30 capsule 6   LORazepam (ATIVAN) 1 MG tablet Take 1 tablet (1 mg total) by mouth every 8 (eight) hours. 30 tablet 0   magic mouthwash (lidocaine, diphenhydrAMINE, alum & mag hydroxide) suspension Take 5 mLs by mouth every 3 (three) hours as needed. Swish and spit; for throat/mouth pain     naloxone (NARCAN) nasal spray 4 mg/0.1  mL One spray in nostril as needed, may repeat every 2 to 3 minutes until medical assistance becomes available 4 each 1   nitroGLYCERIN (NITROSTAT) 0.4 MG SL tablet Place 0.4 mg under the tongue every 5 (five) minutes as needed for chest pain.     omeprazole (PRILOSEC) 40 MG capsule      ondansetron (ZOFRAN) 4 MG tablet Take 1 tablet (4 mg total) by mouth every 4 (four) hours as needed for nausea. 90 tablet 3   Oxycodone HCl 10 MG TABS Take 1 tablet (10 mg total) by mouth every 4 (four) hours as needed. 150 tablet 0   OZEMPIC, 0.25 OR 0.5 MG/DOSE, 2 MG/3ML SOPN Inject into the skin.     potassium chloride SA (KLOR-CON M) 20 MEQ tablet Take 1 tablet (20 mEq total) by mouth 2 (two) times daily. 60 tablet 0   prochlorperazine (COMPAZINE) 10 MG tablet Take 1 tablet (10 mg total) by mouth every 6 (six) hours as needed for nausea or vomiting. 90 tablet 3   valsartan (DIOVAN) 40 MG  tablet TAKE ONE TABLET BY MOUTH EVERY DAY 90 tablet 0   Current Facility-Administered Medications  Medication Dose Route Frequency Provider Last Rate Last Admin   0.9 %  sodium chloride infusion  500 mL Intravenous Once Jackquline Denmark, MD       technetium sestamibi generic (CARDIOLITE) injection 07.1 millicurie  21.9 millicurie Intravenous Once PRN Revankar, Reita Cliche, MD        HISTORY OF PRESENT ILLNESS:   Oncology History  Malignant neoplasm of upper-outer quadrant of left female breast ([place])  07/23/2013 Initial Diagnosis   Malignant neoplasm of upper-outer quadrant of left female breast ([place])   07/23/2013 Cancer Staging   Staging form: Breast, AJCC 7th Edition - Clinical stage from 07/23/2013: Stage IIB (T2, N1, M0) - Signed by Derwood Kaplan, MD on 07/04/2020 Prognostic indicators: Pos LVI   09/22/2021 - 04/06/2022 Chemotherapy   Patient is on Treatment Plan : BREAST  Trastuzumab + Pertuzumab q21d      09/22/2021 -  Chemotherapy   Patient is on Treatment Plan : BREAST Trastuzumab + Pertuzumab q21d     Malignant neoplasm metastatic to bone ([place])  06/28/2021 Initial Diagnosis   Bone metastases ([place])   09/22/2021 - 04/06/2022 Chemotherapy   Patient is on Treatment Plan : BREAST  Trastuzumab + Pertuzumab q21d      09/22/2021 -  Chemotherapy   Patient is on Treatment Plan : BREAST Trastuzumab + Pertuzumab q21d     05/11/2022 Imaging   CT chest, abdomen and pelvis:  IMPRESSION:  1. Widespread bony metastatic disease shows increased sclerosis  since previous imaging. This is likely related to response to  therapy in the interval. There is a single lesion along the LEFT  iliac which shows continued lytic change and warrants attention on  follow-up  2. Interval development of pathologic fractures at in the upper  thoracic spine and at the thoracolumbar junction with associated  kyphosis.  3. No signs of solid organ or nodal metastatic disease.  4. Marked elevation of the LEFT  hemidiaphragm as on the prior PET.  5. Aortic atherosclerosis.        REVIEW OF SYSTEMS:   Review of Systems  Constitutional:  Positive for appetite change. Negative for chills, fever and unexpected weight change.  HENT:   Negative for lump/mass, mouth sores and sore throat.        + Rhinorrhea.  Eyes:  Positive for eye problems (  tearing of her eyes).  Respiratory: Negative.  Negative for chest tightness, cough, hemoptysis, shortness of breath and wheezing.   Cardiovascular: Negative.  Negative for chest pain, leg swelling and palpitations.  Gastrointestinal: Negative.  Negative for abdominal distention, abdominal pain, blood in stool, constipation, diarrhea, nausea and vomiting.  Endocrine: Negative.   Genitourinary:  Negative for difficulty urinating, dysuria, frequency and hematuria.   Musculoskeletal:  Positive for arthralgias and neck pain. Negative for back pain, flank pain and gait problem.  Skin: Negative.   Neurological:  Negative for dizziness, extremity weakness, gait problem, headaches, light-headedness, numbness, seizures and speech difficulty.  Hematological: Negative.  Negative for adenopathy. Does not bruise/bleed easily.  Psychiatric/Behavioral: Negative.  Negative for depression and sleep disturbance. The patient is not nervous/anxious.      VITALS:  Blood pressure 109/76, pulse 99, temperature 99.4 F (37.4 C), temperature source Oral, resp. rate 18, height _0  (1.499 m), weight 131 lb 8 oz (59.6 kg), last menstrual period 08/18/2013, SpO2 100 %.  Wt Readings from Last 3 Encounters:  08/10/22 131 lb (59.4 kg)  08/08/22 130 lb 9.6 oz (59.2 kg)  07/20/22 130 lb 0.6 oz (59 kg)    Body mass index is 26.56 kg/m.  Performance status (ECOG): 1 - Symptomatic but completely ambulatory  PHYSICAL EXAM:  Physical Exam Constitutional:      General: She is not in acute distress.    Appearance: Normal appearance. She is normal weight.  HENT:     Head: Normocephalic  and atraumatic.  Eyes:     General: No scleral icterus.    Extraocular Movements: Extraocular movements intact.     Conjunctiva/sclera: Conjunctivae normal.     Pupils: Pupils are equal, round, and reactive to light.  Cardiovascular:     Rate and Rhythm: Normal rate and regular rhythm.     Pulses: Normal pulses.     Heart sounds: Normal heart sounds. No murmur heard.    No friction rub. No gallop.  Pulmonary:     Effort: Pulmonary effort is normal. No respiratory distress.     Breath sounds: Normal breath sounds.  Abdominal:     General: Bowel sounds are normal. There is no distension.     Palpations: Abdomen is soft. There is no hepatomegaly, splenomegaly or mass.     Tenderness: There is no abdominal tenderness.  Musculoskeletal:        General: Normal range of motion.     Cervical back: Normal range of motion and neck supple.     Right lower leg: No edema.     Left lower leg: No edema.  Lymphadenopathy:     Cervical: No cervical adenopathy.     Right cervical: No superficial, deep or posterior cervical adenopathy.    Left cervical: No superficial, deep or posterior cervical adenopathy.     Upper Body:     Right upper body: No supraclavicular, axillary or pectoral adenopathy.     Left upper body: No supraclavicular, axillary or pectoral adenopathy.  Skin:    General: Skin is warm and dry.  Neurological:     General: No focal deficit present.     Mental Status: She is alert and oriented to person, place, and time. Mental status is at baseline.  Psychiatric:        Mood and Affect: Mood normal.        Behavior: Behavior normal.        Thought Content: Thought content normal.  Judgment: Judgment normal.      LABORATORY DATA:  I have reviewed the data as listed    Component Value Date/Time   NA 143 08/08/2022 1336   NA 138 06/05/2022 0000   K 3.2 (L) 08/08/2022 1336   CL 106 08/08/2022 1336   CO2 30 08/08/2022 1336   GLUCOSE 87 08/08/2022 1336   BUN 14  08/08/2022 1336   BUN 18 06/05/2022 0000   CREATININE 0.60 08/08/2022 1336   CREATININE 0.65 07/11/2022 1116   CALCIUM 8.8 (L) 08/08/2022 1336   PROT 6.7 08/08/2022 1336   ALBUMIN 3.3 (L) 08/08/2022 1336   AST 21 08/08/2022 1336   AST 42 (H) 07/11/2022 1116   ALT 28 08/08/2022 1336   ALT 54 (H) 07/11/2022 1116   ALKPHOS 226 (H) 08/08/2022 1336   BILITOT 0.2 (L) 08/08/2022 1336   BILITOT 0.3 07/11/2022 1116   GFRNONAA >60 08/08/2022 1336   GFRNONAA >60 07/11/2022 1116    No results found for: "SPEP", "UPEP"  Lab Results  Component Value Date   WBC 6.3 08/08/2022   NEUTROABS 4.5 08/08/2022   HGB 10.2 (L) 08/08/2022   HCT 35.2 (L) 08/08/2022   MCV 83.2 08/08/2022   PLT 397 08/08/2022      Chemistry      Component Value Date/Time   NA 143 08/08/2022 1336   NA 138 06/05/2022 0000   K 3.2 (L) 08/08/2022 1336   CL 106 08/08/2022 1336   CO2 30 08/08/2022 1336   BUN 14 08/08/2022 1336   BUN 18 06/05/2022 0000   CREATININE 0.60 08/08/2022 1336   CREATININE 0.65 07/11/2022 1116   GLU 117 06/05/2022 0000      Component Value Date/Time   CALCIUM 8.8 (L) 08/08/2022 1336   ALKPHOS 226 (H) 08/08/2022 1336   AST 21 08/08/2022 1336   AST 42 (H) 07/11/2022 1116   ALT 28 08/08/2022 1336   ALT 54 (H) 07/11/2022 1116   BILITOT 0.2 (L) 08/08/2022 1336   BILITOT 0.3 07/11/2022 1116       RADIOGRAPHIC STUDIES:   ECHOCARDIOGRAM COMPLETE  Result Date: 07/16/2022    ECHOCARDIOGRAM REPORT   Patient Name:   TAISIA FANTINI Kozikowski Date of Exam: 07/16/2022 Medical Rec #:  [id number]           Height:       59.0 in Accession #:    [id number]          Weight:       133.0 lb Date of Birth:  1971-02-09          BSA:          1.551 m Patient Age:    7 years            BP:           109/64 mmHg Patient Gender: F                   HR:           98 bpm. Exam Location:   Procedure: 2D Echo, Cardiac Doppler, Color Doppler and Strain Analysis Indications:    Malignant neoplasm of upper-outer  quadrant of left breast in                 female, estrogen receptor positive ([place]) [C50.412, Z17.0                 (ICD-10-CM)]  History:        Patient has  no prior history of Echocardiogram examinations.                 Risk Factors:Hypertension, Diabetes and Dyslipidemia.  Sonographer:    Luane School RDCS Referring Phys: [place] Comments: Suboptimal apical window and no subcostal window. IMPRESSIONS  1. Normal LV function EF with definity contrast     . Left ventricular ejection fraction, by estimation, is 55 to 60%. The left ventricle has normal function. The left ventricle has no regional wall motion abnormalities. There is mild left ventricular hypertrophy. Left ventricular diastolic parameters  were normal.  2. Right ventricular systolic function is normal. The right ventricular size is normal. There is normal pulmonary artery systolic pressure.  3. The mitral valve is normal in structure. No evidence of mitral valve regurgitation. No evidence of mitral stenosis.  4. The aortic valve is tricuspid. Aortic valve regurgitation is not visualized. No aortic stenosis is present.  5. The inferior vena cava is normal in size with greater than 50% respiratory variability, suggesting right atrial pressure of 3 mmHg. FINDINGS  Left Ventricle: Normal LV function EF with definity contrast. Left ventricular ejection fraction, by estimation, is 55 to 60%. The left ventricle has normal function. The left ventricle has no regional wall motion abnormalities. Definity contrast agent was given IV to delineate the left ventricular endocardial borders. Global longitudinal strain performed but not reported based on interpreter judgement due to suboptimal tracking. The left ventricular internal cavity size was normal in size. There is mild left ventricular hypertrophy. Left ventricular diastolic  parameters were normal. Normal left ventricular filling pressure. Right Ventricle: The right ventricular size is  normal. Right ventricular systolic function is normal. There is normal pulmonary artery systolic pressure. The tricuspid regurgitant velocity is 2.05 m/s, and with an assumed right atrial pressure of 3 mmHg,  the estimated right ventricular systolic pressure is 10.0 mmHg. Left Atrium: Left atrial size was normal in size. Right Atrium: Right atrial size was normal in size. Pericardium: There is no evidence of pericardial effusion. Mitral Valve: The mitral valve is normal in structure. No evidence of mitral valve regurgitation. No evidence of mitral valve stenosis. Tricuspid Valve: The tricuspid valve is normal in structure. Tricuspid valve regurgitation is not demonstrated. No evidence of tricuspid stenosis. Aortic Valve: The aortic valve is tricuspid. Aortic valve regurgitation is not visualized. No aortic stenosis is present. Pulmonic Valve: The pulmonic valve was normal in structure. Pulmonic valve regurgitation is not visualized. No evidence of pulmonic stenosis. Aorta: The aortic root and ascending aorta are structurally normal, with no evidence of dilitation. Venous: The pulmonary veins were not well visualized. The inferior vena cava is normal in size with greater than 50% respiratory variability, suggesting right atrial pressure of 3 mmHg. IAS/Shunts: No atrial level shunt detected by color flow Doppler.  LEFT VENTRICLE PLAX 2D LVIDd:         3.40 cm     Diastology LVIDs:         2.80 cm     LV e' medial:    11.60 cm/s LV PW:         1.00 cm     LV E/e' medial:  8.7 LV IVS:        1.10 cm     LV e' lateral:   16.20 cm/s LVOT diam:     2.00 cm     LV E/e' lateral: 6.2 LV SV:         46  LV SV Index:   30 LVOT Area:     3.14 cm  LV Volumes (MOD) LV vol d, MOD A2C: 37.3 ml LV vol d, MOD A4C: 57.4 ml LV vol s, MOD A2C: 18.3 ml LV vol s, MOD A4C: 32.7 ml LV SV MOD A2C:     19.0 ml LV SV MOD A4C:     57.4 ml LV SV MOD BP:      22.2 ml RIGHT VENTRICLE RV S prime:     9.03 cm/s TAPSE (M-mode): 2.1 cm LEFT ATRIUM              Index LA diam:        2.30 cm 1.48 cm/m LA Vol (A2C):   34.4 ml 22.18 ml/m LA Vol (A4C):   29.5 ml 19.02 ml/m LA Biplane Vol: 33.6 ml 21.66 ml/m  AORTIC VALVE LVOT Vmax:   80.40 cm/s LVOT Vmean:  53.700 cm/s LVOT VTI:    0.148 m  AORTA Ao Root diam: 2.90 cm Ao Asc diam:  2.90 cm MITRAL VALVE                TRICUSPID VALVE MV Area (PHT): 5.16 cm     TR Peak grad:   16.8 mmHg MV Decel Time: 147 msec     TR Vmax:        205.00 cm/s MV E velocity: 101.00 cm/s MV A velocity: 77.10 cm/s   SHUNTS MV E/A ratio:  1.31         Systemic VTI:  0.15 m                             Systemic Diam: 2.00 cm Shirlee More MD Electronically signed by Shirlee More MD Signature Date/Time: 07/16/2022/5:04:35 PM    Final         Tilden Fossa as a scribe for Derwood Kaplan, MD.,have documented all relevant documentation on the behalf of Derwood Kaplan, MD,as directed by  Derwood Kaplan, MD while in the presence of Derwood Kaplan, MD.

## 2022-07-19 ENCOUNTER — Other Ambulatory Visit: Payer: Self-pay

## 2022-07-19 DIAGNOSIS — Z5111 Encounter for antineoplastic chemotherapy: Secondary | ICD-10-CM | POA: Diagnosis not present

## 2022-07-19 MED FILL — Trastuzumab-dkst For IV Soln 150 MG: INTRAVENOUS | Qty: 17 | Status: AC

## 2022-07-19 MED FILL — Pertuzumab Soln for IV Infusion 420 MG/14ML (30 MG/ML): INTRAVENOUS | Qty: 14 | Status: AC

## 2022-07-20 ENCOUNTER — Inpatient Hospital Stay: Payer: BC Managed Care – PPO

## 2022-07-20 VITALS — BP 106/59 | HR 93 | Temp 98.3°F | Resp 18 | Wt 130.0 lb

## 2022-07-20 DIAGNOSIS — Z170 Estrogen receptor positive status [ER+]: Secondary | ICD-10-CM

## 2022-07-20 DIAGNOSIS — C7951 Secondary malignant neoplasm of bone: Secondary | ICD-10-CM

## 2022-07-20 DIAGNOSIS — Z5111 Encounter for antineoplastic chemotherapy: Secondary | ICD-10-CM | POA: Diagnosis not present

## 2022-07-20 MED ORDER — TRASTUZUMAB-DKST CHEMO 150 MG IV SOLR
6.0000 mg/kg | Freq: Once | INTRAVENOUS | Status: AC
  Administered 2022-07-20: 357 mg via INTRAVENOUS
  Filled 2022-07-20: qty 17

## 2022-07-20 MED ORDER — SODIUM CHLORIDE 0.9% FLUSH
10.0000 mL | INTRAVENOUS | Status: DC | PRN
  Administered 2022-07-20: 10 mL

## 2022-07-20 MED ORDER — SODIUM CHLORIDE 0.9 % IV SOLN: Freq: Once | INTRAVENOUS | Status: AC

## 2022-07-20 MED ORDER — HEPARIN SOD (PORK) LOCK FLUSH 100 UNIT/ML IV SOLN
500.0000 [IU] | Freq: Once | INTRAVENOUS | Status: AC | PRN
  Administered 2022-07-20: 500 [IU]

## 2022-07-20 MED ORDER — SODIUM CHLORIDE 0.9 % IV SOLN
420.0000 mg | Freq: Once | INTRAVENOUS | Status: AC
  Administered 2022-07-20: 420 mg via INTRAVENOUS
  Filled 2022-07-20: qty 14

## 2022-07-20 MED ORDER — ACETAMINOPHEN 325 MG PO TABS: 650.0000 mg | ORAL_TABLET | Freq: Once | ORAL | Status: DC

## 2022-07-20 MED ORDER — DIPHENHYDRAMINE HCL 25 MG PO CAPS: 25.0000 mg | ORAL_CAPSULE | Freq: Once | ORAL | Status: DC

## 2022-07-20 NOTE — Patient Instructions (Signed)
Pertuzumab Injection What is this medication? PERTUZUMAB (per TOOZ ue mab) treats breast cancer. It works by blocking a protein that causes cancer cells to grow and multiply. This helps to slow or stop the spread of cancer cells. It is a monoclonal antibody. This medicine may be used for other purposes; ask your health care provider or pharmacist if you have questions. COMMON BRAND NAME(S): PERJETA What should I tell my care team before I take this medication? They need to know if you have any of these conditions: Heart failure An unusual or allergic reaction to pertuzumab, other medications, foods, dyes, or preservatives Pregnant or trying to get pregnant Breast-feeding How should I use this medication? This medication is injected into a vein. It is given by your care team in a hospital or clinic setting. Talk to your care team about the use of this medication in children. Special care may be needed. Overdosage: If you think you have taken too much of this medicine contact a poison control center or emergency room at once. NOTE: This medicine is only for you. Do not share this medicine with others. What if I miss a dose? Keep appointments for follow-up doses. It is important not to miss your dose. Call your care team if you are unable to keep an appointment. What may interact with this medication? Interactions are not expected. This list may not describe all possible interactions. Give your health care provider a list of all the medicines, herbs, non-prescription drugs, or dietary supplements you use. Also tell them if you smoke, drink alcohol, or use illegal drugs. Some items may interact with your medicine. What should I watch for while using this medication? Your condition will be monitored carefully while you are receiving this medication. This medication may make you feel generally unwell. This is not uncommon as chemotherapy can affect healthy cells as well as cancer cells. Report any  side effects. Continue your course of treatment even though you feel ill unless your care team tells you to stop. Talk to your care team if you may be pregnant. Serious birth defects can occur if you take this medication during pregnancy and for 7 months after the last dose. You will need a negative pregnancy test before starting this medication. Contraception is recommended while taking this medication and for 7 months after the last dose. Your care team can help you find the option that works for you. Do not breastfeed while taking this medication and for 7 months after the last dose. What side effects may I notice from receiving this medication? Side effects that you should report to your care team as soon as possible: Allergic reactions or angioedema--skin rash, itching or hives, swelling of the face, eyes, lips, tongue, arms, or legs, trouble swallowing or breathing Heart failure--shortness of breath, swelling of the ankles, feet, or hands, sudden weight gain, unusual weakness or fatigue Infusion reactions--chest pain, shortness of breath or trouble breathing, feeling faint or lightheaded Side effects that usually do not require medical attention (report to your care team if they continue or are bothersome): Diarrhea Dry skin Fatigue Hair loss Nausea Vomiting This list may not describe all possible side effects. Call your doctor for medical advice about side effects. You may report side effects to FDA at 1-800-FDA-1088. Where should I keep my medication? This medication is given in a hospital or clinic. It will not be stored at home. NOTE: This sheet is a summary. It may not cover all possible information. If you have  questions about this medicine, talk to your doctor, pharmacist, or health care provider.  2023 Elsevier/Gold Standard (2021-12-12 00:00:00) Trastuzumab Injection What is this medication? TRASTUZUMAB (tras TOO zoo mab) treats breast cancer and stomach cancer. It works by  blocking a protein that causes cancer cells to grow and multiply. This helps to slow or stop the spread of cancer cells. This medicine may be used for other purposes; ask your health care provider or pharmacist if you have questions. COMMON BRAND NAME(S): Herceptin, Janae Bridgeman, Ontruzant, Trazimera What should I tell my care team before I take this medication? They need to know if you have any of these conditions: Heart failure Lung disease An unusual or allergic reaction to trastuzumab, other medications, foods, dyes, or preservatives Pregnant or trying to get pregnant Breast-feeding How should I use this medication? This medication is injected into a vein. It is given by your care team in a hospital or clinic setting. Talk to your care team about the use of this medication in children. It is not approved for use in children. Overdosage: If you think you have taken too much of this medicine contact a poison control center or emergency room at once. NOTE: This medicine is only for you. Do not share this medicine with others. What if I miss a dose? Keep appointments for follow-up doses. It is important not to miss your dose. Call your care team if you are unable to keep an appointment. What may interact with this medication? Certain types of chemotherapy, such as daunorubicin, doxorubicin, epirubicin, idarubicin This list may not describe all possible interactions. Give your health care provider a list of all the medicines, herbs, non-prescription drugs, or dietary supplements you use. Also tell them if you smoke, drink alcohol, or use illegal drugs. Some items may interact with your medicine. What should I watch for while using this medication? Your condition will be monitored carefully while you are receiving this medication. This medication may make you feel generally unwell. This is not uncommon, as chemotherapy affects healthy cells as well as cancer cells. Report any side  effects. Continue your course of treatment even though you feel ill unless your care team tells you to stop. This medication may increase your risk of getting an infection. Call your care team for advice if you get a fever, chills, sore throat, or other symptoms of a cold or flu. Do not treat yourself. Try to avoid being around people who are sick. Avoid taking medications that contain aspirin, acetaminophen, ibuprofen, naproxen, or ketoprofen unless instructed by your care team. These medications can hide a fever. Talk to your care team if you may be pregnant. Serious birth defects can occur if you take this medication during pregnancy and for 7 months after the last dose. You will need a negative pregnancy test before starting this medication. Contraception is recommended while taking this medication and for 7 months after the last dose. Your care team can help you find the option that works for you. Do not breastfeed while taking this medication and for 7 months after stopping treatment. What side effects may I notice from receiving this medication? Side effects that you should report to your care team as soon as possible: Allergic reactions or angioedema--skin rash, itching or hives, swelling of the face, eyes, lips, tongue, arms, or legs, trouble swallowing or breathing Dry cough, shortness of breath or trouble breathing Heart failure--shortness of breath, swelling of the ankles, feet, or hands, sudden weight gain, unusual weakness  or fatigue Infection--fever, chills, cough, or sore throat Infusion reactions--chest pain, shortness of breath or trouble breathing, feeling faint or lightheaded Side effects that usually do not require medical attention (report to your care team if they continue or are bothersome): Diarrhea Dizziness Headache Nausea Trouble sleeping Vomiting This list may not describe all possible side effects. Call your doctor for medical advice about side effects. You may report  side effects to FDA at 1-800-FDA-1088. Where should I keep my medication? This medication is given in a hospital or clinic. It will not be stored at home. NOTE: This sheet is a summary. It may not cover all possible information. If you have questions about this medicine, talk to your doctor, pharmacist, or health care provider.  2023 Elsevier/Gold Standard (2021-11-30 00:00:00)

## 2022-07-24 ENCOUNTER — Telehealth: Payer: Self-pay

## 2022-07-24 NOTE — Telephone Encounter (Signed)
-----   Message from Derwood Kaplan, MD sent at 07/12/2022  8:11 PM EST ----- Regarding: RE: KCL=3.2 Yes, we need to tell her to take K bid and will be rechecked next week. Thank you  I guess this was an extra lab for the [facility] tomorrow so I didn't have it on my radar, I am not seeing her until next week, will give a chance to recheck it then ----- Message ----- From: Juanetta Beets, [facility] Sent: 07/12/2022   1:32 PM EST To: Derwood Kaplan, MD; Belva Chimes, LPN Subject: VXB=4.1                                        Patient has lasix and potassium on her med list but it says she stopped taking them.  Do you want her to resume the potassium?

## 2022-07-24 NOTE — Telephone Encounter (Signed)
Patient notified of lab results

## 2022-07-26 LAB — VITAMIN D 1,25 DIHYDROXY
Vitamin D 1, 25 (OH)2 Total: 202 pg/mL — ABNORMAL HIGH
Vitamin D2 1, 25 (OH)2: 179 pg/mL
Vitamin D3 1, 25 (OH)2: 23 pg/mL

## 2022-07-30 ENCOUNTER — Telehealth: Payer: Self-pay

## 2022-07-30 NOTE — Telephone Encounter (Signed)
I spoke with pt. She wanted to know who she needed to notify about her pain. The pain is the same she has been having, it was just worse this morning. She has prescription for oxycodone 29m 1 tab every 4 hours for pain. I encouraged her to take the pain medication as written until pain is tolerable. She will also take tylenol in between to help with breakthrough pain, when it isn't time for another oxycodone. If pain persists, she will notify her PCP for further recommendations.

## 2022-08-01 ENCOUNTER — Telehealth: Payer: Self-pay

## 2022-08-01 NOTE — Telephone Encounter (Signed)
-----   Message from Derwood Kaplan, MD sent at 07/26/2022  7:03 PM EST ----- Regarding: call Tell her vitamin D level is very high - 200, so can stop the weekly doses for now

## 2022-08-01 NOTE — Telephone Encounter (Signed)
Patient notified of lab results and to stop taking her once a week medication. Patient voiced her understanding/

## 2022-08-07 ENCOUNTER — Other Ambulatory Visit: Payer: Self-pay

## 2022-08-07 DIAGNOSIS — R0789 Other chest pain: Secondary | ICD-10-CM

## 2022-08-07 DIAGNOSIS — C7951 Secondary malignant neoplasm of bone: Secondary | ICD-10-CM

## 2022-08-07 DIAGNOSIS — M545 Low back pain, unspecified: Secondary | ICD-10-CM

## 2022-08-07 MED ORDER — OXYCODONE HCL 10 MG PO TABS: 10.0000 mg | ORAL_TABLET | ORAL | 0 refills | Status: DC | PRN

## 2022-08-08 ENCOUNTER — Inpatient Hospital Stay: Payer: BC Managed Care – PPO

## 2022-08-08 ENCOUNTER — Other Ambulatory Visit: Payer: Self-pay | Admitting: Oncology

## 2022-08-08 ENCOUNTER — Inpatient Hospital Stay (INDEPENDENT_AMBULATORY_CARE_PROVIDER_SITE_OTHER): Payer: BC Managed Care – PPO | Admitting: Oncology

## 2022-08-08 VITALS — BP 127/78 | HR 83 | Temp 99.2°F | Resp 18 | Ht 59.0 in | Wt 130.6 lb

## 2022-08-08 DIAGNOSIS — Z5111 Encounter for antineoplastic chemotherapy: Secondary | ICD-10-CM | POA: Diagnosis not present

## 2022-08-08 DIAGNOSIS — Z170 Estrogen receptor positive status [ER+]: Secondary | ICD-10-CM | POA: Diagnosis not present

## 2022-08-08 DIAGNOSIS — E876 Hypokalemia: Secondary | ICD-10-CM

## 2022-08-08 DIAGNOSIS — C7951 Secondary malignant neoplasm of bone: Secondary | ICD-10-CM

## 2022-08-08 DIAGNOSIS — C50412 Malignant neoplasm of upper-outer quadrant of left female breast: Secondary | ICD-10-CM | POA: Diagnosis not present

## 2022-08-08 LAB — COMPREHENSIVE METABOLIC PANEL
ALT: 28 U/L (ref 0–44)
AST: 21 U/L (ref 15–41)
Albumin: 3.3 g/dL — ABNORMAL LOW (ref 3.5–5.0)
Alkaline Phosphatase: 226 U/L — ABNORMAL HIGH (ref 38–126)
Anion gap: 7 (ref 5–15)
BUN: 14 mg/dL (ref 6–20)
CO2: 30 mmol/L (ref 22–32)
Calcium: 8.8 mg/dL — ABNORMAL LOW (ref 8.9–10.3)
Chloride: 106 mmol/L (ref 98–111)
Creatinine, Ser: 0.6 mg/dL (ref 0.44–1.00)
GFR, Estimated: >60 mL/min (ref >60)
Glucose, Bld: 87 mg/dL (ref 70–99)
Potassium: 3.2 mmol/L — ABNORMAL LOW (ref 3.5–5.1)
Sodium: 143 mmol/L (ref 135–145)
Total Bilirubin: 0.2 mg/dL — ABNORMAL LOW (ref 0.3–1.2)
Total Protein: 6.7 g/dL (ref 6.5–8.1)

## 2022-08-08 LAB — CBC WITH DIFFERENTIAL/PLATELET
Abs Immature Granulocytes: 0.02 10*3/uL (ref 0.00–0.07)
Basophils Absolute: 0 10*3/uL (ref 0.0–0.1)
Basophils Relative: 1 %
Eosinophils Absolute: 0.2 10*3/uL (ref 0.0–0.5)
Eosinophils Relative: 4 %
HCT: 35.2 % — ABNORMAL LOW (ref 36.0–46.0)
Hemoglobin: 10.2 g/dL — ABNORMAL LOW (ref 12.0–15.0)
Immature Granulocytes: 0 %
Lymphocytes Relative: 16 %
Lymphs Abs: 1 10*3/uL (ref 0.7–4.0)
MCH: 24.1 pg — ABNORMAL LOW (ref 26.0–34.0)
MCHC: 29 g/dL — ABNORMAL LOW (ref 30.0–36.0)
MCV: 83.2 fL (ref 80.0–100.0)
Monocytes Absolute: 0.6 10*3/uL (ref 0.1–1.0)
Monocytes Relative: 9 %
Neutro Abs: 4.5 10*3/uL (ref 1.7–7.7)
Neutrophils Relative %: 70 %
Platelets: 397 10*3/uL (ref 150–400)
RBC: 4.23 MIL/uL (ref 3.87–5.11)
RDW: 17.8 % — ABNORMAL HIGH (ref 11.5–15.5)
WBC: 6.3 10*3/uL (ref 4.0–10.5)
nRBC: 0 % (ref 0.0–0.2)

## 2022-08-08 NOTE — Progress Notes (Signed)
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 She was on adjuvant hormonal therapy with tamoxifen 20 mg daily from October 2015 to May 2020, but due to elevation of the liver transaminases, was switched to anastrazole.   Malignant neoplasm metastatic to bone [facility]) New bone metastases, November 2022, with multiple marrow replacing lesions within the proximal right femur within the ischium as well as the inferior rami. 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 Ki67 was 60%. PET imaging from December 19th revealed dominant finding of intensely hypermetabolic aggressive skeletal metastasis involving the calvarium, sternum, thoracic and lumbar spine, and pelvis, with potential pathologic fracture of the right femur with internal fixation. There is soft tissue extension into the anterior mediastinum associated with the manubrial metastasis. Multiple spinal vertebral body lesions have cortical destruction along the posterior margin. She started monthly Xgeva in December, and completed radiation therapy. She was receiving THP and will plan for hormonal therapy.  Cycle 4 was delayed and dose reduction made by 20% due to left leg infection. She will continue with Herceptin/ Perjeta, but we dropped the chemotherapy.  We can now plan to initiate hormonal  therapy.  Neck pain She is now having problems with stiffness of her neck and pain that she rates as a 7 out of 10.  She is having trouble holding her neck up. She was evaluated by Dr. Gladstone Lighter [facility]. He provided her with a cervical collar. Dr. Gladstone Lighter did not recommend MRI and patient canceled her upcoming appointment for Friday. I have encouraged her to take her extended release pain medication. She still has extensive muscle spasm and is going to physical therapy now.  Increased Left Hip Pain Innumerable metastatic lesions throughout the pelvis and proximal femur. There is a non displaced pathologic fracture through a large lesion in the superior left acetabulum. She will be referred to a orthopedic oncologist at [facility] Dr. Jackqulyn Livings. I think she will need radiation but we may need to wait until after his consultation.  Hypokalemia Her potassium is only 3.2 despite taking potassium supplement at 20 mEq twice daily I am not sure how compliant she has been but I think we need to increase this to 3 times daily.  We may also want to give her a small dose of IV potassium when she goes for her treatment.  Hypocalcemia Her calcium is borderline at 8.8 but this is with taking 6 tablets daily.  Her vitamin D level was adequate.  I stressed the importance of her calcium supplement.  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  Her latest hemoglobin has dropped even a little lower at 10.2.  She was found to be iron deficient in September 2023 and was given IV iron supplement.  I think we will need to recheck her iron studies at her  next visit.  Ulceration of the right upper nose The patient believes this is from her glasses and that certainly is possible but this appears to be a fairly deep ulceration with slightly raised edges and I am concerned about the possibility of a skin cancer.  If this  does not resolve, she needs to be seen by the dermatologist.  Plan:  She will continue Herceptin and Perjeta and get Xgeva every 4 weeks.  I will see her back in 3 weeks prior to her next Herceptin with CBC, CMP, iron, TIBC and ferritin.  She may need IV iron again.  I will discuss her case with our radiation oncologist, Dr. Gatha Mayer, but we will likely wait for her consultation with the orthopedic oncologist at [facility] before proceeding with any radiation.  She will now be due for hormonal therapy and we will discuss the options of oral agents versus Faslodex injections.  She has already been treated in the past with tamoxifen and anastrozole and she prefers to go with the Faslodex.  I will refer her to the dermatologist to evaluate the lesion in her right upper nose as this appears to be a fairly deep ulceration at this time.  I have cautioned her to stop wearing her glasses for now to see if it heals.  I will schedule her for bilateral mammogram. She will continue the oxycodone 39m every 4hrs. The patient understands the plans discussed today and is in agreement with them.  She knows to contact our office if she develops concerns prior to her next appointment.  I provided 35 minutes of face-to-face time during this encounter and > 50% was spent counseling as documented under my assessment and plan.    CDerwood Kaplan MD  CTakilma39[address] Olive [id number]: [phone] Fax: [phone]  No orders of the defined types were placed in this encounter.     CHIEF COMPLAINT:  CC: A 51year old female with history of breast cancer here for 1 week evaluation  Current Treatment:  THP; will proceed with HP  INTERVAL HISTORY:  RCassadyis here today for repeat clinical assessment for her breast cancer metastatic to bone.  The patient reports increased pain of the left hip. This is severe and the MRI is  extremely abnormal with a non displaced pathologic fracture through the superior left acetabulum.  She has innumerable metastatic lesions throughout the pelvis and proximal femur. I discussed with her about getting her hip and pelvis reconstructed. She continues taking oxycodone every 4hrs for pain, she does experience some slight itching. If the pill isn't holding I suggested we can take a long lasting medication method that isn't MS Contin. She does not tolerate MS Contin well. I recommended that she keep track of her bowels and make sure she doesn't get constipated. Her next appointment is scheduled on 08/10/22 for monthly Xgeva and her Herceptin and Perjeta are given every 3 weeks.  She has a ulceration on the right side of her nose and I suggested that she see a dermatologist and keep her glasses off the wound. She thinks this is from her glasses but it looks more serious to me.   They stated they will try Medi-honey for healing, but if this persists she will need to see the dermatologist. I will schedule her for a mammogram in the future. She denies signs of infection such as sore throat, sinus drainage, cough,  or urinary symptoms.  She denies fevers or recurrent chills. She denies pain. She denies nausea, vomiting, chest pain, dyspnea or cough.  Her appetite is fair.  Her weight has decreased 1 pounds over last 2 weeks .  I have reviewed the past medical history, past surgical history, social history and family history with the patient and they are unchanged from previous note.  ALLERGIES:  has No Known Allergies.  MEDICATIONS:  Current Outpatient Medications  Medication Sig Dispense Refill   albuterol (VENTOLIN HFA) 108 (90 Base) MCG/ACT inhaler Inhale 2 puffs into the lungs every 6 (six) hours as needed for shortness of breath.     ARIPiprazole (ABILIFY) 5 MG tablet Take 2.5 mg by mouth daily.     aspirin 81 MG EC tablet Take 81 mg by mouth daily. Swallow whole.     atorvastatin (LIPITOR) 20 MG  tablet TAKE ONE TABLET BY MOUTH EVERY DAY 90 tablet 0   augmented betamethasone dipropionate (DIPROLENE-AF) 0.05 % cream      calcium carbonate (OS-CAL) 1250 (500 Ca) MG chewable tablet Chew 6 tablets by mouth daily.     CLENPIQ 10-3.5-12 MG-GM -GM/160ML SOLN      desvenlafaxine (PRISTIQ) 50 MG 24 hr tablet Take 50 mg by mouth daily.     diphenoxylate-atropine (LOMOTIL) 2.5-0.025 MG tablet TAKE TWO TABLETS BY MOUTH FOUR TIMES A DAY AS NEEDED FOR DIARRHEA OR LOOSE STOOLS 100 tablet 1   FARXIGA 10 MG TABS tablet Take 1 tablet (10 mg total) by mouth daily. 30 tablet 11   furosemide (LASIX) 20 MG tablet Take 1 tablet (20 mg total) by mouth daily. 30 tablet 0   labetalol (NORMODYNE) 200 MG tablet Take 1 tablet (200 mg total) by mouth 2 (two) times daily. 180 tablet 3   Loratadine 10 MG CAPS Take 1 capsule (10 mg total) by mouth daily. 30 capsule 6   LORazepam (ATIVAN) 1 MG tablet Take 1 tablet (1 mg total) by mouth every 8 (eight) hours. 30 tablet 0   magic mouthwash (lidocaine, diphenhydrAMINE, alum & mag hydroxide) suspension Take 5 mLs by mouth every 3 (three) hours as needed. Swish and spit; for throat/mouth pain     naloxone (NARCAN) nasal spray 4 mg/0.1 mL One spray in nostril as needed, may repeat every 2 to 3 minutes until medical assistance becomes available 4 each 1   nitroGLYCERIN (NITROSTAT) 0.4 MG SL tablet Place 0.4 mg under the tongue every 5 (five) minutes as needed for chest pain.     omeprazole (PRILOSEC) 40 MG capsule      ondansetron (ZOFRAN) 4 MG tablet Take 1 tablet (4 mg total) by mouth every 4 (four) hours as needed for nausea. 90 tablet 3   Oxycodone HCl 10 MG TABS Take 1 tablet (10 mg total) by mouth every 4 (four) hours as needed. 150 tablet 0   OZEMPIC, 0.25 OR 0.5 MG/DOSE, 2 MG/3ML SOPN Inject into the skin.     potassium chloride SA (KLOR-CON M) 20 MEQ tablet Take 1 tablet (20 mEq total) by mouth 2 (two) times daily. 60 tablet 0   prochlorperazine (COMPAZINE) 10 MG tablet  Take 1 tablet (10 mg total) by mouth every 6 (six) hours as needed for nausea or vomiting. 90 tablet 3   valsartan (DIOVAN) 40 MG tablet TAKE ONE TABLET BY MOUTH EVERY DAY 90 tablet 0   Current Facility-Administered Medications  Medication Dose Route Frequency Provider Last Rate Last Admin   0.9 %  sodium chloride infusion  500 mL Intravenous Once Jackquline Denmark, MD       technetium sestamibi generic (CARDIOLITE) injection 51.7 millicurie  61.6 millicurie Intravenous Once PRN Revankar, Reita Cliche, MD        HISTORY OF PRESENT ILLNESS:   Oncology History  Malignant neoplasm of upper-outer quadrant of left female breast ([place])  07/23/2013 Initial Diagnosis   Malignant neoplasm of upper-outer quadrant of left female breast ([place])   07/23/2013 Cancer Staging   Staging form: Breast, AJCC 7th Edition - Clinical stage from 07/23/2013: Stage IIB (T2, N1, M0) - Signed by Derwood Kaplan, MD on 07/04/2020 Prognostic indicators: Pos LVI   09/22/2021 - 04/06/2022 Chemotherapy   Patient is on Treatment Plan : BREAST  Trastuzumab + Pertuzumab q21d      09/22/2021 -  Chemotherapy   Patient is on Treatment Plan : BREAST Trastuzumab + Pertuzumab q21d     Malignant neoplasm metastatic to bone ([place])  06/28/2021 Initial Diagnosis   Bone metastases ([place])   09/22/2021 - 04/06/2022 Chemotherapy   Patient is on Treatment Plan : BREAST  Trastuzumab + Pertuzumab q21d      09/22/2021 -  Chemotherapy   Patient is on Treatment Plan : BREAST Trastuzumab + Pertuzumab q21d     05/11/2022 Imaging   CT chest, abdomen and pelvis:  IMPRESSION:  1. Widespread bony metastatic disease shows increased sclerosis  since previous imaging. This is likely related to response to  therapy in the interval. There is a single lesion along the LEFT  iliac which shows continued lytic change and warrants attention on  follow-up  2. Interval development of pathologic fractures at in the upper  thoracic spine and at the  thoracolumbar junction with associated  kyphosis.  3. No signs of solid organ or nodal metastatic disease.  4. Marked elevation of the LEFT hemidiaphragm as on the prior PET.  5. Aortic atherosclerosis.        REVIEW OF SYSTEMS:  Review of Systems  Constitutional:  Positive for appetite change. Negative for chills, fever and unexpected weight change.  HENT:  Negative.  Negative for lump/mass, mouth sores and sore throat.   Eyes: Negative.   Respiratory: Negative.  Negative for chest tightness, cough, hemoptysis, shortness of breath and wheezing.   Cardiovascular: Negative.  Negative for chest pain, leg swelling and palpitations.  Gastrointestinal: Negative.  Negative for abdominal distention, abdominal pain, blood in stool, constipation, diarrhea, nausea and vomiting.  Endocrine: Negative.   Genitourinary: Negative.  Negative for difficulty urinating, dysuria, frequency and hematuria.   Musculoskeletal:  Positive for back pain. Negative for arthralgias, flank pain and gait problem.       Increased pain of the left hip. This is severe and the MRI is extremely abnormal with a non displaced pathologic fracture through the superior left acetabulum.  Skin: Negative.   Neurological: Negative.  Negative for dizziness, extremity weakness, gait problem, headaches, light-headedness, numbness, seizures and speech difficulty.  Hematological: Negative.  Negative for adenopathy. Does not bruise/bleed easily.  Psychiatric/Behavioral: Negative.  Negative for depression and sleep disturbance. The patient is not nervous/anxious.      VITALS:  Blood pressure 127/78, pulse 83, temperature 99.2 F (37.3 C), temperature source Oral, resp. rate 18, height _0  (1.499 m), weight 130 lb 9.6 oz (59.2 kg), last menstrual period 08/18/2013, SpO2 99 %.  Wt Readings from Last 3 Encounters:  08/10/22 131 lb (59.4 kg)  08/08/22 130 lb 9.6 oz (59.2 kg)  07/20/22 130 lb 0.6 oz (59 kg)  Body mass index is 26.38  kg/m.  Performance status (ECOG): 1 - Symptomatic but completely ambulatory  PHYSICAL EXAM:  Physical Exam Constitutional:      General: She is not in acute distress.    Appearance: Normal appearance. She is normal weight. She is not ill-appearing.  HENT:     Head: Normocephalic and atraumatic.     Nose: Nose normal.     Mouth/Throat:     Mouth: Mucous membranes are moist.     Pharynx: Oropharynx is clear.  Eyes:     General: No scleral icterus.    Extraocular Movements: Extraocular movements intact.     Conjunctiva/sclera: Conjunctivae normal.     Pupils: Pupils are equal, round, and reactive to light.  Cardiovascular:     Rate and Rhythm: Normal rate and regular rhythm.     Pulses: Normal pulses.     Heart sounds: Normal heart sounds. No murmur heard.    No friction rub. No gallop.  Pulmonary:     Effort: Pulmonary effort is normal. No respiratory distress.     Breath sounds: Normal breath sounds.  Abdominal:     General: Bowel sounds are normal. There is no distension.     Palpations: Abdomen is soft. There is no hepatomegaly, splenomegaly or mass.     Tenderness: There is no abdominal tenderness.     Comments: Feels a little firm but she has used the bathroom previously today.  Musculoskeletal:        General: Normal range of motion.     Cervical back: Normal range of motion and neck supple.     Right lower leg: No edema.     Left lower leg: No edema.     Comments: Legs are firm with chronic vascular changes.  Lymphadenopathy:     Cervical: No cervical adenopathy.     Right cervical: No superficial, deep or posterior cervical adenopathy.    Left cervical: No superficial, deep or posterior cervical adenopathy.     Upper Body:     Right upper body: No supraclavicular, axillary or pectoral adenopathy.     Left upper body: No supraclavicular, axillary or pectoral adenopathy.  Skin:    General: Skin is warm and dry.  Neurological:     General: No focal deficit  present.     Mental Status: She is alert and oriented to person, place, and time. Mental status is at baseline.  Psychiatric:        Mood and Affect: Mood normal.        Behavior: Behavior normal.        Thought Content: Thought content normal.        Judgment: Judgment normal.      LABORATORY DATA:  I have reviewed the data as listed    Component Value Date/Time   NA 143 08/08/2022 1336   NA 138 06/05/2022 0000   K 3.2 (L) 08/08/2022 1336   CL 106 08/08/2022 1336   CO2 30 08/08/2022 1336   GLUCOSE 87 08/08/2022 1336   BUN 14 08/08/2022 1336   BUN 18 06/05/2022 0000   CREATININE 0.60 08/08/2022 1336   CREATININE 0.65 07/11/2022 1116   CALCIUM 8.8 (L) 08/08/2022 1336   PROT 6.7 08/08/2022 1336   ALBUMIN 3.3 (L) 08/08/2022 1336   AST 21 08/08/2022 1336   AST 42 (H) 07/11/2022 1116   ALT 28 08/08/2022 1336   ALT 54 (H) 07/11/2022 1116   ALKPHOS 226 (H) 08/08/2022 1336   BILITOT  0.2 (L) 08/08/2022 1336   BILITOT 0.3 07/11/2022 1116   GFRNONAA >60 08/08/2022 1336   GFRNONAA >60 07/11/2022 1116    No results found for: "SPEP", "UPEP"  Lab Results  Component Value Date   WBC 6.3 08/08/2022   NEUTROABS 4.5 08/08/2022   HGB 10.2 (L) 08/08/2022   HCT 35.2 (L) 08/08/2022   MCV 83.2 08/08/2022   PLT 397 08/08/2022      Chemistry      Component Value Date/Time   NA 143 08/08/2022 1336   NA 138 06/05/2022 0000   K 3.2 (L) 08/08/2022 1336   CL 106 08/08/2022 1336   CO2 30 08/08/2022 1336   BUN 14 08/08/2022 1336   BUN 18 06/05/2022 0000   CREATININE 0.60 08/08/2022 1336   CREATININE 0.65 07/11/2022 1116   GLU 117 06/05/2022 0000      Component Value Date/Time   CALCIUM 8.8 (L) 08/08/2022 1336   ALKPHOS 226 (H) 08/08/2022 1336   AST 21 08/08/2022 1336   AST 42 (H) 07/11/2022 1116   ALT 28 08/08/2022 1336   ALT 54 (H) 07/11/2022 1116   BILITOT 0.2 (L) 08/08/2022 1336   BILITOT 0.3 07/11/2022 1116       RADIOGRAPHIC STUDIES: I have personally reviewed the  radiological images as listed and agreed with the findings in the report. No results found.      I,Jasmine M Lassiter,acting as a scribe for Derwood Kaplan, MD.,have documented all relevant documentation on the behalf of Derwood Kaplan, MD,as directed by  Derwood Kaplan, MD while in the presence of Derwood Kaplan, MD.

## 2022-08-09 ENCOUNTER — Other Ambulatory Visit: Payer: Self-pay

## 2022-08-09 MED FILL — Pertuzumab Soln for IV Infusion 420 MG/14ML (30 MG/ML): INTRAVENOUS | Qty: 14 | Status: AC

## 2022-08-09 MED FILL — Trastuzumab-dkst For IV Soln 150 MG: INTRAVENOUS | Qty: 17 | Status: AC

## 2022-08-10 ENCOUNTER — Inpatient Hospital Stay: Payer: BC Managed Care – PPO

## 2022-08-10 VITALS — BP 120/81 | HR 85 | Temp 98.4°F | Resp 14 | Ht 59.0 in | Wt 131.0 lb

## 2022-08-10 DIAGNOSIS — Z5111 Encounter for antineoplastic chemotherapy: Secondary | ICD-10-CM | POA: Diagnosis not present

## 2022-08-10 DIAGNOSIS — Z170 Estrogen receptor positive status [ER+]: Secondary | ICD-10-CM

## 2022-08-10 DIAGNOSIS — C7951 Secondary malignant neoplasm of bone: Secondary | ICD-10-CM

## 2022-08-10 MED ORDER — ACETAMINOPHEN 325 MG PO TABS: 650.0000 mg | ORAL_TABLET | Freq: Once | ORAL | Status: DC

## 2022-08-10 MED ORDER — SODIUM CHLORIDE 0.9% FLUSH
10.0000 mL | INTRAVENOUS | Status: DC | PRN
  Administered 2022-08-10: 10 mL

## 2022-08-10 MED ORDER — DIPHENHYDRAMINE HCL 25 MG PO CAPS: 25.0000 mg | ORAL_CAPSULE | Freq: Once | ORAL | Status: DC

## 2022-08-10 MED ORDER — HEPARIN SOD (PORK) LOCK FLUSH 100 UNIT/ML IV SOLN
500.0000 [IU] | Freq: Once | INTRAVENOUS | Status: AC | PRN
  Administered 2022-08-10: 500 [IU]

## 2022-08-10 MED ORDER — SODIUM CHLORIDE 0.9 % IV SOLN
420.0000 mg | Freq: Once | INTRAVENOUS | Status: AC
  Administered 2022-08-10: 420 mg via INTRAVENOUS
  Filled 2022-08-10: qty 14

## 2022-08-10 MED ORDER — SODIUM CHLORIDE 0.9 % IV SOLN: Freq: Once | INTRAVENOUS | Status: AC

## 2022-08-10 MED ORDER — TRASTUZUMAB-DKST CHEMO 150 MG IV SOLR
6.0000 mg/kg | Freq: Once | INTRAVENOUS | Status: AC
  Administered 2022-08-10: 357 mg via INTRAVENOUS
  Filled 2022-08-10: qty 17

## 2022-08-10 MED ORDER — DENOSUMAB 120 MG/1.7ML ~~LOC~~ SOLN
120.0000 mg | Freq: Once | SUBCUTANEOUS | Status: AC
  Administered 2022-08-10: 120 mg via SUBCUTANEOUS
  Filled 2022-08-10: qty 1.7

## 2022-08-10 NOTE — Patient Instructions (Signed)
Pertuzumab Injection What is this medication? PERTUZUMAB (per TOOZ ue mab) treats breast cancer. It works by blocking a protein that causes cancer cells to grow and multiply. This helps to slow or stop the spread of cancer cells. It is a monoclonal antibody. This medicine may be used for other purposes; ask your health care provider or pharmacist if you have questions. COMMON BRAND NAME(S): PERJETA What should I tell my care team before I take this medication? They need to know if you have any of these conditions: Heart failure An unusual or allergic reaction to pertuzumab, other medications, foods, dyes, or preservatives Pregnant or trying to get pregnant Breast-feeding How should I use this medication? This medication is injected into a vein. It is given by your care team in a hospital or clinic setting. Talk to your care team about the use of this medication in children. Special care may be needed. Overdosage: If you think you have taken too much of this medicine contact a poison control center or emergency room at once. NOTE: This medicine is only for you. Do not share this medicine with others. What if I miss a dose? Keep appointments for follow-up doses. It is important not to miss your dose. Call your care team if you are unable to keep an appointment. What may interact with this medication? Interactions are not expected. This list may not describe all possible interactions. Give your health care provider a list of all the medicines, herbs, non-prescription drugs, or dietary supplements you use. Also tell them if you smoke, drink alcohol, or use illegal drugs. Some items may interact with your medicine. What should I watch for while using this medication? Your condition will be monitored carefully while you are receiving this medication. This medication may make you feel generally unwell. This is not uncommon as chemotherapy can affect healthy cells as well as cancer cells. Report any  side effects. Continue your course of treatment even though you feel ill unless your care team tells you to stop. Talk to your care team if you may be pregnant. Serious birth defects can occur if you take this medication during pregnancy and for 7 months after the last dose. You will need a negative pregnancy test before starting this medication. Contraception is recommended while taking this medication and for 7 months after the last dose. Your care team can help you find the option that works for you. Do not breastfeed while taking this medication and for 7 months after the last dose. What side effects may I notice from receiving this medication? Side effects that you should report to your care team as soon as possible: Allergic reactions or angioedema--skin rash, itching or hives, swelling of the face, eyes, lips, tongue, arms, or legs, trouble swallowing or breathing Heart failure--shortness of breath, swelling of the ankles, feet, or hands, sudden weight gain, unusual weakness or fatigue Infusion reactions--chest pain, shortness of breath or trouble breathing, feeling faint or lightheaded Side effects that usually do not require medical attention (report to your care team if they continue or are bothersome): Diarrhea Dry skin Fatigue Hair loss Nausea Vomiting This list may not describe all possible side effects. Call your doctor for medical advice about side effects. You may report side effects to FDA at 1-800-FDA-1088. Where should I keep my medication? This medication is given in a hospital or clinic. It will not be stored at home. NOTE: This sheet is a summary. It may not cover all possible information. If you have  questions about this medicine, talk to your doctor, pharmacist, or health care provider.  2023 Elsevier/Gold Standard (2021-12-12 00:00:00) Trastuzumab Injection What is this medication? TRASTUZUMAB (tras TOO zoo mab) treats breast cancer and stomach cancer. It works by  blocking a protein that causes cancer cells to grow and multiply. This helps to slow or stop the spread of cancer cells. This medicine may be used for other purposes; ask your health care provider or pharmacist if you have questions. COMMON BRAND NAME(S): Herceptin, Janae Bridgeman, Ontruzant, Trazimera What should I tell my care team before I take this medication? They need to know if you have any of these conditions: Heart failure Lung disease An unusual or allergic reaction to trastuzumab, other medications, foods, dyes, or preservatives Pregnant or trying to get pregnant Breast-feeding How should I use this medication? This medication is injected into a vein. It is given by your care team in a hospital or clinic setting. Talk to your care team about the use of this medication in children. It is not approved for use in children. Overdosage: If you think you have taken too much of this medicine contact a poison control center or emergency room at once. NOTE: This medicine is only for you. Do not share this medicine with others. What if I miss a dose? Keep appointments for follow-up doses. It is important not to miss your dose. Call your care team if you are unable to keep an appointment. What may interact with this medication? Certain types of chemotherapy, such as daunorubicin, doxorubicin, epirubicin, idarubicin This list may not describe all possible interactions. Give your health care provider a list of all the medicines, herbs, non-prescription drugs, or dietary supplements you use. Also tell them if you smoke, drink alcohol, or use illegal drugs. Some items may interact with your medicine. What should I watch for while using this medication? Your condition will be monitored carefully while you are receiving this medication. This medication may make you feel generally unwell. This is not uncommon, as chemotherapy affects healthy cells as well as cancer cells. Report any side  effects. Continue your course of treatment even though you feel ill unless your care team tells you to stop. This medication may increase your risk of getting an infection. Call your care team for advice if you get a fever, chills, sore throat, or other symptoms of a cold or flu. Do not treat yourself. Try to avoid being around people who are sick. Avoid taking medications that contain aspirin, acetaminophen, ibuprofen, naproxen, or ketoprofen unless instructed by your care team. These medications can hide a fever. Talk to your care team if you may be pregnant. Serious birth defects can occur if you take this medication during pregnancy and for 7 months after the last dose. You will need a negative pregnancy test before starting this medication. Contraception is recommended while taking this medication and for 7 months after the last dose. Your care team can help you find the option that works for you. Do not breastfeed while taking this medication and for 7 months after stopping treatment. What side effects may I notice from receiving this medication? Side effects that you should report to your care team as soon as possible: Allergic reactions or angioedema--skin rash, itching or hives, swelling of the face, eyes, lips, tongue, arms, or legs, trouble swallowing or breathing Dry cough, shortness of breath or trouble breathing Heart failure--shortness of breath, swelling of the ankles, feet, or hands, sudden weight gain, unusual weakness  or fatigue Infection--fever, chills, cough, or sore throat Infusion reactions--chest pain, shortness of breath or trouble breathing, feeling faint or lightheaded Side effects that usually do not require medical attention (report to your care team if they continue or are bothersome): Diarrhea Dizziness Headache Nausea Trouble sleeping Vomiting This list may not describe all possible side effects. Call your doctor for medical advice about side effects. You may report  side effects to FDA at 1-800-FDA-1088. Where should I keep my medication? This medication is given in a hospital or clinic. It will not be stored at home. NOTE: This sheet is a summary. It may not cover all possible information. If you have questions about this medicine, talk to your doctor, pharmacist, or health care provider.  2023 Elsevier/Gold Standard (2021-11-30 00:00:00) Denosumab Injection (Oncology) What is this medication? DENOSUMAB (den oh SUE mab) prevents weakened bones caused by cancer. It may also be used to treat noncancerous bone tumors that cannot be removed by surgery. It can also be used to treat high calcium levels in the blood caused by cancer. It works by blocking a protein that causes bones to break down quickly. This slows down the release of calcium from bones, which lowers calcium levels in your blood. It also makes your bones stronger and less likely to break (fracture). This medicine may be used for other purposes; ask your health care provider or pharmacist if you have questions. COMMON BRAND NAME(S): XGEVA What should I tell my care team before I take this medication? They need to know if you have any of these conditions: Dental disease Having surgery or tooth extraction Infection Kidney disease Low levels of calcium or vitamin D in the blood Malnutrition On hemodialysis Skin conditions or sensitivity Thyroid or parathyroid disease An unusual reaction to denosumab, other medications, foods, dyes, or preservatives Pregnant or trying to get pregnant Breast-feeding How should I use this medication? This medication is for injection under the skin. It is given by your care team in a hospital or clinic setting. A special MedGuide will be given to you before each treatment. Be sure to read this information carefully each time. Talk to your care team about the use of this medication in children. While it may be prescribed for children as young as 13 years for  selected conditions, precautions do apply. Overdosage: If you think you have taken too much of this medicine contact a poison control center or emergency room at once. NOTE: This medicine is only for you. Do not share this medicine with others. What if I miss a dose? Keep appointments for follow-up doses. It is important not to miss your dose. Call your care team if you are unable to keep an appointment. What may interact with this medication? Do not take this medication with any of the following: Other medications containing denosumab This medication may also interact with the following: Medications that lower your chance of fighting infection Steroid medications, such as prednisone or cortisone This list may not describe all possible interactions. Give your health care provider a list of all the medicines, herbs, non-prescription drugs, or dietary supplements you use. Also tell them if you smoke, drink alcohol, or use illegal drugs. Some items may interact with your medicine. What should I watch for while using this medication? Your condition will be monitored carefully while you are receiving this medication. You may need blood work while taking this medication. This medication may increase your risk of getting an infection. Call your care team for advice  if you get a fever, chills, sore throat, or other symptoms of a cold or flu. Do not treat yourself. Try to avoid being around people who are sick. You should make sure you get enough calcium and vitamin D while you are taking this medication, unless your care team tells you not to. Discuss the foods you eat and the vitamins you take with your care team. Some people who take this medication have severe bone, joint, or muscle pain. This medication may also increase your risk for jaw problems or a broken thigh bone. Tell your care team right away if you have severe pain in your jaw, bones, joints, or muscles. Tell your care team if you have any pain  that does not go away or that gets worse. Talk to your care team if you may be pregnant. Serious birth defects can occur if you take this medication during pregnancy and for 5 months after the last dose. You will need a negative pregnancy test before starting this medication. Contraception is recommended while taking this medication and for 5 months after the last dose. Your care team can help you find the option that works for you. What side effects may I notice from receiving this medication? Side effects that you should report to your care team as soon as possible: Allergic reactions--skin rash, itching, hives, swelling of the face, lips, tongue, or throat Bone, joint, or muscle pain Low calcium level--muscle pain or cramps, confusion, tingling, or numbness in the hands or feet Osteonecrosis of the jaw--pain, swelling, or redness in the mouth, numbness of the jaw, poor healing after dental work, unusual discharge from the mouth, visible bones in the mouth Side effects that usually do not require medical attention (report to your care team if they continue or are bothersome): Cough Diarrhea Fatigue Headache Nausea This list may not describe all possible side effects. Call your doctor for medical advice about side effects. You may report side effects to FDA at 1-800-FDA-1088. Where should I keep my medication? This medication is given in a hospital or clinic. It will not be stored at home. NOTE: This sheet is a summary. It may not cover all possible information. If you have questions about this medicine, talk to your doctor, pharmacist, or health care provider.  2023 Elsevier/Gold Standard (2021-12-18 00:00:00)

## 2022-08-13 ENCOUNTER — Encounter: Payer: Self-pay | Admitting: Oncology

## 2022-08-13 ENCOUNTER — Other Ambulatory Visit: Payer: Self-pay | Admitting: Oncology

## 2022-08-13 DIAGNOSIS — C7951 Secondary malignant neoplasm of bone: Secondary | ICD-10-CM

## 2022-08-14 ENCOUNTER — Other Ambulatory Visit: Payer: Self-pay | Admitting: Oncology

## 2022-08-15 ENCOUNTER — Encounter: Payer: Self-pay | Admitting: Oncology

## 2022-08-15 ENCOUNTER — Other Ambulatory Visit: Payer: Self-pay | Admitting: Oncology

## 2022-08-16 ENCOUNTER — Other Ambulatory Visit: Payer: Self-pay | Admitting: Oncology

## 2022-08-16 ENCOUNTER — Encounter: Payer: Self-pay | Admitting: Oncology

## 2022-08-16 ENCOUNTER — Telehealth: Payer: Self-pay | Admitting: Oncology

## 2022-08-16 DIAGNOSIS — E876 Hypokalemia: Secondary | ICD-10-CM | POA: Insufficient documentation

## 2022-08-16 NOTE — Telephone Encounter (Signed)
Patient has been scheduled. Aware of appt date and time   Scheduling Message Entered by Ulice Dash M on 08/16/2022 at 11:43 AM Priority: High <No visit type provided>  Department: [facility]  Provider:  Appointment Notes:  Please schedule pt for faslodex injections on the following dates:  1/5, 1/19, 2/2, then 3/1.  Move xgeva appt to 2/2.  Scheduling Notes:

## 2022-08-17 ENCOUNTER — Inpatient Hospital Stay: Payer: BC Managed Care – PPO | Attending: Hematology and Oncology

## 2022-08-17 ENCOUNTER — Other Ambulatory Visit: Payer: Self-pay

## 2022-08-17 VITALS — BP 98/70 | HR 98 | Temp 98.4°F | Resp 14 | Ht 59.0 in | Wt 131.0 lb

## 2022-08-17 DIAGNOSIS — Z5111 Encounter for antineoplastic chemotherapy: Secondary | ICD-10-CM | POA: Insufficient documentation

## 2022-08-17 DIAGNOSIS — C7951 Secondary malignant neoplasm of bone: Secondary | ICD-10-CM | POA: Diagnosis not present

## 2022-08-17 DIAGNOSIS — C50412 Malignant neoplasm of upper-outer quadrant of left female breast: Secondary | ICD-10-CM | POA: Diagnosis not present

## 2022-08-17 DIAGNOSIS — Z170 Estrogen receptor positive status [ER+]: Secondary | ICD-10-CM | POA: Insufficient documentation

## 2022-08-17 DIAGNOSIS — Z5112 Encounter for antineoplastic immunotherapy: Secondary | ICD-10-CM | POA: Insufficient documentation

## 2022-08-17 MED ORDER — FULVESTRANT 250 MG/5ML IM SOSY
500.0000 mg | PREFILLED_SYRINGE | Freq: Once | INTRAMUSCULAR | Status: AC
  Administered 2022-08-17: 250 mg via INTRAMUSCULAR
  Filled 2022-08-17: qty 10

## 2022-08-27 NOTE — Progress Notes (Signed)
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 She was on adjuvant hormonal therapy with tamoxifen 20 mg daily from October 2015 to May 2020, but due to elevation of the liver transaminases, was switched to anastrazole.   Malignant neoplasm metastatic to bone [facility]) New bone metastases, November 2022, with multiple marrow replacing lesions within the proximal right femur within the ischium as well as the inferior rami. 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 Ki67 was 60%. PET imaging from December 19th revealed dominant finding of intensely hypermetabolic aggressive skeletal metastasis involving the calvarium, sternum, thoracic and lumbar spine, and pelvis, with potential pathologic fracture of the right femur with internal fixation. There is soft tissue extension into the anterior mediastinum associated with the manubrial metastasis. Multiple spinal vertebral body lesions have cortical destruction along the posterior margin. She started monthly Xgeva in December, and completed radiation therapy. She was receiving THP and will plan for hormonal therapy.  Cycle 4 was delayed and dose reduction made by 20% due to left leg infection. She will continue with Herceptin/ Perjeta, but we dropped the chemotherapy.  We can now plan to initiate hormonal  therapy.  Neck pain She then developed problems with stiffness of her neck and pain that she rated as a 7 out of 10.  She was having trouble holding her neck up. She was evaluated by Dr. Gladstone Lighter with [facility]. He provided her with a cervical collar. She had physical therapy and this has improved.  Increased Left Hip Pain Innumerable metastatic lesions throughout the pelvis and proximal femur. There is a non displaced pathologic fracture through a large lesion in the superior left acetabulum. She was referred to a orthopedic oncologist at [facility] and they recommended radiation and no surgery. I have contacted Dr. Orlene Erm and he will see her in 2 days.   Hypokalemia Her potassium was only 3.2 last time despite taking potassium supplement at 20 mEq twice daily I am not sure how compliant she has been but she is now taking it twice a day. We may also want to give her a small dose of IV potassium when she goes for her treatment if her potassium is still low today.  Hypocalcemia Her calcium is borderline at 8.8 but this is with taking 6 tablets daily.  Her vitamin D level was adequate.  I recommended she increase her calcium supplement to TID.  I stressed the importance of her calcium supplement.  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  Her latest hemoglobin has dropped even a little lower at 10.2.  She was found to be iron deficient in September 2023 and was given IV iron supplement.  I think we will need to recheck her iron studies at her next visit.  Ulceration of the right upper nose The patient believes this  is from her glasses and that certainly is possible but this appears to be a fairly deep ulceration with slightly raised edges and I am concerned about the possibility of a skin cancer.  If this does not resolve, she needs to be seen by the dermatologist.  Plan:  She will continue  Herceptin and Perjeta and get Xgeva every 4 weeks.  I will see her back in 3 weeks prior to her next Herceptin with CBC, CMP.  She may need IV potassium. I have referred her to Dr. Orlene Erm for palliative radiation.  She will now be due for hormonal therapy and we will discuss the options of oral agents versus Faslodex injections.  She has already been treated in the past with tamoxifen and anastrozole and she prefers to go with the Faslodex.  I will refer her to the dermatologist to evaluate the lesion in her right upper nose as this appears to be a fairly deep ulceration at this time.  I have cautioned her to stop wearing her glasses for now to see if it heals.  I will schedule her for bilateral mammogram. She will continue the oxycodone '10mg'$  every 4hrs. The patient understands the plans discussed today and is in agreement with them.  She knows to contact our office if she develops concerns prior to her next appointment.  I provided 35 minutes of face-to-face time during this encounter and > 50% was spent counseling as documented under my assessment and plan.    Derwood Kaplan, MD  [facility] [address] [place] [address] Dept: [phone] Dept Fax: [phone]   No orders of the defined types were placed in this encounter.     CHIEF COMPLAINT:  CC: A 52 year old female with history of breast cancer here for 1 week evaluation  Current Treatment:  THP; will proceed with HP  INTERVAL HISTORY:  Anna [name] is here today for repeat clinical assessment for her breast cancer metastatic to bone.  Patient states that she is feeling ok and that her pain isn't too bad. She continues to take oxycodone occasionally through the day and needs it every 4 hours around the clock.  The patient reported increased pain of the left hip. This is severe and the MRI is extremely abnormal with a non displaced pathologic fracture through the  superior left acetabulum.  Patient was able to be seen by [facility]. They agreed to proceed with radiation. She continues to take 2 potassium pills a day. When I receive her labs I will know whether to a add IV potassium. She is taking 2 calcium pills daily and I advised her to start taking 3 daily. She is still taking oral potassium twice daily and I will advise her on that after I get the lab results. She has a ulceration on the right side of her nose and I suggested that she see a dermatologist and keep her glasses off. She thinks this is from her glasses but it looks more serious to me. She will return in 3 weeks with CBC and CMP. She denies signs of infection such as sore throat, sinus drainage, cough, or urinary symptoms.  She denies fevers or recurrent chills. She denies pain. She denies nausea, vomiting, chest pain, dyspnea or cough. Her weight has been stable. She is accompanied today with her husband as usual.   I have reviewed the past medical history, past surgical history, social history and family history  with the patient and they are unchanged from previous note.  ALLERGIES:  has No Known Allergies.  MEDICATIONS:  Current Outpatient Medications  Medication Sig Dispense Refill   albuterol (VENTOLIN HFA) 108 (90 Base) MCG/ACT inhaler Inhale 2 puffs into the lungs every 6 (six) hours as needed for shortness of breath.     ARIPiprazole (ABILIFY) 5 MG tablet Take 2.5 mg by mouth daily.     aspirin 81 MG EC tablet Take 81 mg by mouth daily. Swallow whole.     atorvastatin (LIPITOR) 20 MG tablet TAKE ONE TABLET BY MOUTH EVERY DAY 90 tablet 0   augmented betamethasone dipropionate (DIPROLENE-AF) 0.05 % cream      calcium carbonate (OS-CAL) 1250 (500 Ca) MG chewable tablet Chew 6 tablets by mouth daily.     CLENPIQ 10-3.5-12 MG-GM -GM/160ML SOLN      desvenlafaxine (PRISTIQ) 50 MG 24 hr tablet Take 50 mg by mouth daily.     diphenoxylate-atropine (LOMOTIL) 2.5-0.025 MG tablet  TAKE TWO TABLETS BY MOUTH FOUR TIMES A DAY AS NEEDED FOR DIARRHEA OR LOOSE STOOLS 100 tablet 1   FARXIGA 10 MG TABS tablet Take 1 tablet (10 mg total) by mouth daily. 30 tablet 11   furosemide (LASIX) 20 MG tablet Take 1 tablet (20 mg total) by mouth daily. 30 tablet 0   labetalol (NORMODYNE) 200 MG tablet Take 1 tablet (200 mg total) by mouth 2 (two) times daily. 180 tablet 3   Loratadine 10 MG CAPS Take 1 capsule (10 mg total) by mouth daily. 30 capsule 6   LORazepam (ATIVAN) 1 MG tablet Take 1 tablet (1 mg total) by mouth every 8 (eight) hours. 30 tablet 0   magic mouthwash (lidocaine, diphenhydrAMINE, alum & mag hydroxide) suspension Take 5 mLs by mouth every 3 (three) hours as needed. Swish and spit; for throat/mouth pain     naloxone (NARCAN) nasal spray 4 mg/0.1 mL One spray in nostril as needed, may repeat every 2 to 3 minutes until medical assistance becomes available 4 each 1   nitroGLYCERIN (NITROSTAT) 0.4 MG SL tablet Place 0.4 mg under the tongue every 5 (five) minutes as needed for chest pain.     omeprazole (PRILOSEC) 40 MG capsule      ondansetron (ZOFRAN) 4 MG tablet Take 1 tablet (4 mg total) by mouth every 4 (four) hours as needed for nausea. 90 tablet 3   Oxycodone HCl 10 MG TABS TAKE ONE TABLET BY MOUTH EVERY 4 HOURS AS NEEDED 180 tablet 0   OZEMPIC, 0.25 OR 0.5 MG/DOSE, 2 MG/3ML SOPN Inject into the skin.     potassium chloride SA (KLOR-CON M) 20 MEQ tablet Take 1 tablet (20 mEq total) by mouth 2 (two) times daily. 60 tablet 0   prochlorperazine (COMPAZINE) 10 MG tablet Take 1 tablet (10 mg total) by mouth every 6 (six) hours as needed for nausea or vomiting. 90 tablet 3   valsartan (DIOVAN) 40 MG tablet TAKE ONE TABLET BY MOUTH EVERY DAY 90 tablet 0   Current Facility-Administered Medications  Medication Dose Route Frequency Provider Last Rate Last Admin   0.9 %  sodium chloride infusion  500 mL Intravenous Once Jackquline Denmark, MD       technetium sestamibi generic  (CARDIOLITE) injection 06.2 millicurie  69.4 millicurie Intravenous Once PRN Revankar, Reita Cliche, MD        HISTORY OF PRESENT ILLNESS:   Oncology History  Malignant neoplasm of upper-outer quadrant of left female breast ([place])  07/23/2013  Initial Diagnosis   Malignant neoplasm of upper-outer quadrant of left female breast ([place])   07/23/2013 Cancer Staging   Staging form: Breast, AJCC 7th Edition - Clinical stage from 07/23/2013: Stage IIB (T2, N1, M0) - Signed by Derwood Kaplan, MD on 07/04/2020 Prognostic indicators: Pos LVI   09/22/2021 - 04/06/2022 Chemotherapy   Patient is on Treatment Plan : BREAST  Trastuzumab + Pertuzumab q21d      09/22/2021 -  Chemotherapy   Patient is on Treatment Plan : BREAST Trastuzumab + Pertuzumab q21d     Malignant neoplasm metastatic to bone ([place])  06/28/2021 Initial Diagnosis   Bone metastases ([place])   09/22/2021 - 04/06/2022 Chemotherapy   Patient is on Treatment Plan : BREAST  Trastuzumab + Pertuzumab q21d      09/22/2021 -  Chemotherapy   Patient is on Treatment Plan : BREAST Trastuzumab + Pertuzumab q21d     05/11/2022 Imaging   CT chest, abdomen and pelvis:  IMPRESSION:  1. Widespread bony metastatic disease shows increased sclerosis  since previous imaging. This is likely related to response to  therapy in the interval. There is a single lesion along the LEFT  iliac which shows continued lytic change and warrants attention on  follow-up  2. Interval development of pathologic fractures at in the upper  thoracic spine and at the thoracolumbar junction with associated  kyphosis.  3. No signs of solid organ or nodal metastatic disease.  4. Marked elevation of the LEFT hemidiaphragm as on the prior PET.  5. Aortic atherosclerosis.        REVIEW OF SYSTEMS:  Review of Systems  Constitutional:  Positive for appetite change. Negative for chills, diaphoresis, fatigue, fever and unexpected weight change.  HENT:  Negative.  Negative for  hearing loss, lump/mass, mouth sores, nosebleeds, sore throat, tinnitus, trouble swallowing and voice change.   Eyes: Negative.  Negative for eye problems and icterus.  Respiratory: Negative.  Negative for chest tightness, cough, hemoptysis, shortness of breath and wheezing.   Cardiovascular: Negative.  Negative for chest pain, leg swelling and palpitations.  Gastrointestinal: Negative.  Negative for abdominal distention, abdominal pain, blood in stool, constipation, diarrhea, nausea, rectal pain and vomiting.  Endocrine: Negative.   Genitourinary: Negative.  Negative for bladder incontinence, difficulty urinating, dyspareunia, dysuria, frequency, hematuria, menstrual problem, nocturia, pelvic pain, vaginal bleeding and vaginal discharge.   Musculoskeletal:  Positive for back pain (5/10 and pain in her left hip.). Negative for arthralgias, flank pain, gait problem, myalgias, neck pain and neck stiffness.       This is severe and the MRI is extremely abnormal with a non displaced pathologic fracture through the superior left acetabulum. Persistent pain of the left hip.  Skin: Negative.  Negative for itching, rash and wound.  Neurological: Negative.  Negative for dizziness, extremity weakness, gait problem, headaches, light-headedness, numbness, seizures and speech difficulty.  Hematological: Negative.  Negative for adenopathy. Does not bruise/bleed easily.  Psychiatric/Behavioral: Negative.  Negative for confusion, decreased concentration, depression, sleep disturbance and suicidal ideas. The patient is not nervous/anxious.      VITALS:  Blood pressure 116/72, pulse 91, temperature 98.9 F (37.2 C), temperature source Oral, resp. rate 18, height '4\' 11"'$  (1.499 m), weight 130 lb 11.2 oz (59.3 kg), last menstrual period 08/18/2013, SpO2 99 %.  Wt Readings from Last 3 Encounters:  09/14/22 132 lb (59.9 kg)  08/31/22 130 lb 1.3 oz (59 kg)  08/29/22 130 lb 11.2 oz (59.3 kg)    Body mass  index is  26.4 kg/m.  Performance status (ECOG): 1 - Symptomatic but completely ambulatory  PHYSICAL EXAM:  Physical Exam Vitals and nursing note reviewed. Exam conducted with a chaperone present.  Constitutional:      General: She is not in acute distress.    Appearance: Normal appearance. She is normal weight. She is not ill-appearing, toxic-appearing or diaphoretic.  HENT:     Head: Normocephalic and atraumatic.     Right Ear: Tympanic membrane, ear canal and external ear normal. There is no impacted cerumen.     Left Ear: Tympanic membrane, ear canal and external ear normal. There is no impacted cerumen.     Nose: Nose normal. No congestion or rhinorrhea.     Mouth/Throat:     Mouth: Mucous membranes are moist.     Pharynx: Oropharynx is clear. No oropharyngeal exudate or posterior oropharyngeal erythema.  Eyes:     General: No scleral icterus.       Right eye: No discharge.        Left eye: No discharge.     Extraocular Movements: Extraocular movements intact.     Conjunctiva/sclera: Conjunctivae normal.     Pupils: Pupils are equal, round, and reactive to light.  Neck:     Vascular: No carotid bruit.  Cardiovascular:     Rate and Rhythm: Normal rate and regular rhythm.     Pulses: Normal pulses.     Heart sounds: Normal heart sounds. No murmur heard.    No friction rub. No gallop.  Pulmonary:     Effort: Pulmonary effort is normal. No respiratory distress.     Breath sounds: Normal breath sounds. No stridor. No wheezing, rhonchi or rales.  Chest:     Chest wall: No tenderness.  Abdominal:     General: Bowel sounds are normal. There is no distension.     Palpations: Abdomen is soft. There is no hepatomegaly, splenomegaly or mass.     Tenderness: There is no abdominal tenderness. There is no right CVA tenderness, left CVA tenderness, guarding or rebound.     Hernia: No hernia is present.  Musculoskeletal:        General: No swelling, tenderness, deformity or signs of injury.  Normal range of motion.     Cervical back: Normal range of motion and neck supple. No rigidity or tenderness.     Right lower leg: No edema.     Left lower leg: No edema.     Comments: Legs look stable still very erythematous and dry with some edema and induration.  Lymphadenopathy:     Cervical: No cervical adenopathy.     Right cervical: No superficial, deep or posterior cervical adenopathy.    Left cervical: No superficial, deep or posterior cervical adenopathy.     Upper Body:     Right upper body: No supraclavicular, axillary or pectoral adenopathy.     Left upper body: No supraclavicular, axillary or pectoral adenopathy.  Skin:    General: Skin is warm and dry.     Coloration: Skin is not jaundiced or pale.     Findings: Erythema present. No bruising, lesion or rash.  Neurological:     General: No focal deficit present.     Mental Status: She is alert and oriented to person, place, and time. Mental status is at baseline.     Cranial Nerves: No cranial nerve deficit.     Sensory: No sensory deficit.     Motor: No weakness.  Coordination: Coordination normal.     Gait: Gait normal.     Deep Tendon Reflexes: Reflexes normal.  Psychiatric:        Mood and Affect: Mood normal.        Behavior: Behavior normal.        Thought Content: Thought content normal.        Judgment: Judgment normal.    LABORATORY DATA:  I have reviewed the data as listed    Component Value Date/Time   NA 142 08/29/2022 1332   NA 138 06/05/2022 0000   K 3.4 (L) 08/29/2022 1332   CL 104 08/29/2022 1332   CO2 30 08/29/2022 1332   GLUCOSE 78 08/29/2022 1332   BUN 10 08/29/2022 1332   BUN 18 06/05/2022 0000   CREATININE 0.74 08/29/2022 1332   CREATININE 0.65 07/11/2022 1116   CALCIUM 8.7 (L) 08/29/2022 1332   PROT 7.5 08/29/2022 1332   ALBUMIN 3.5 08/29/2022 1332   AST 23 08/29/2022 1332   AST 42 (H) 07/11/2022 1116   ALT 24 08/29/2022 1332   ALT 54 (H) 07/11/2022 1116   ALKPHOS 261 (H)  08/29/2022 1332   BILITOT 0.3 08/29/2022 1332   BILITOT 0.3 07/11/2022 1116   GFRNONAA >60 08/29/2022 1332   GFRNONAA >60 07/11/2022 1116    No results found for: "SPEP", "UPEP"  Lab Results  Component Value Date   WBC 6.2 08/29/2022   NEUTROABS 4.2 08/29/2022   HGB 11.2 (L) 08/29/2022   HCT 39.2 08/29/2022   MCV 82.7 08/29/2022   PLT 527 (H) 08/29/2022      Chemistry      Component Value Date/Time   NA 142 08/29/2022 1332   NA 138 06/05/2022 0000   K 3.4 (L) 08/29/2022 1332   CL 104 08/29/2022 1332   CO2 30 08/29/2022 1332   BUN 10 08/29/2022 1332   BUN 18 06/05/2022 0000   CREATININE 0.74 08/29/2022 1332   CREATININE 0.65 07/11/2022 1116   GLU 117 06/05/2022 0000      Component Value Date/Time   CALCIUM 8.7 (L) 08/29/2022 1332   ALKPHOS 261 (H) 08/29/2022 1332   AST 23 08/29/2022 1332   AST 42 (H) 07/11/2022 1116   ALT 24 08/29/2022 1332   ALT 54 (H) 07/11/2022 1116   BILITOT 0.3 08/29/2022 1332   BILITOT 0.3 07/11/2022 1116      RADIOGRAPHIC STUDIES: I have personally reviewed the radiological images as listed and agreed with the findings in the report. No results found.      I,Jasmine M Lassiter,acting as a scribe for Derwood Kaplan, MD.,have documented all relevant documentation on the behalf of Derwood Kaplan, MD,as directed by  Derwood Kaplan, MD while in the presence of Derwood Kaplan, MD.

## 2022-08-29 ENCOUNTER — Inpatient Hospital Stay: Payer: BC Managed Care – PPO

## 2022-08-29 ENCOUNTER — Encounter: Payer: Self-pay | Admitting: Oncology

## 2022-08-29 ENCOUNTER — Inpatient Hospital Stay (INDEPENDENT_AMBULATORY_CARE_PROVIDER_SITE_OTHER): Payer: BC Managed Care – PPO | Admitting: Oncology

## 2022-08-29 VITALS — BP 116/72 | HR 91 | Temp 98.9°F | Resp 18 | Ht 59.0 in | Wt 130.7 lb

## 2022-08-29 DIAGNOSIS — C7951 Secondary malignant neoplasm of bone: Secondary | ICD-10-CM | POA: Diagnosis not present

## 2022-08-29 DIAGNOSIS — Z170 Estrogen receptor positive status [ER+]: Secondary | ICD-10-CM | POA: Diagnosis not present

## 2022-08-29 DIAGNOSIS — I890 Lymphedema, not elsewhere classified: Secondary | ICD-10-CM

## 2022-08-29 DIAGNOSIS — C50412 Malignant neoplasm of upper-outer quadrant of left female breast: Secondary | ICD-10-CM | POA: Diagnosis not present

## 2022-08-29 DIAGNOSIS — Z5111 Encounter for antineoplastic chemotherapy: Secondary | ICD-10-CM | POA: Diagnosis not present

## 2022-08-29 LAB — COMPREHENSIVE METABOLIC PANEL
ALT: 24 U/L (ref 0–44)
AST: 23 U/L (ref 15–41)
Albumin: 3.5 g/dL (ref 3.5–5.0)
Alkaline Phosphatase: 261 U/L — ABNORMAL HIGH (ref 38–126)
Anion gap: 8 (ref 5–15)
BUN: 10 mg/dL (ref 6–20)
CO2: 30 mmol/L (ref 22–32)
Calcium: 8.7 mg/dL — ABNORMAL LOW (ref 8.9–10.3)
Chloride: 104 mmol/L (ref 98–111)
Creatinine, Ser: 0.74 mg/dL (ref 0.44–1.00)
GFR, Estimated: >60 mL/min (ref >60)
Glucose, Bld: 78 mg/dL (ref 70–99)
Potassium: 3.4 mmol/L — ABNORMAL LOW (ref 3.5–5.1)
Sodium: 142 mmol/L (ref 135–145)
Total Bilirubin: 0.3 mg/dL (ref 0.3–1.2)
Total Protein: 7.5 g/dL (ref 6.5–8.1)

## 2022-08-29 LAB — CBC WITH DIFFERENTIAL/PLATELET
Abs Immature Granulocytes: 0.03 10*3/uL (ref 0.00–0.07)
Basophils Absolute: 0.1 10*3/uL (ref 0.0–0.1)
Basophils Relative: 1 %
Eosinophils Absolute: 0.3 10*3/uL (ref 0.0–0.5)
Eosinophils Relative: 5 %
HCT: 39.2 % (ref 36.0–46.0)
Hemoglobin: 11.2 g/dL — ABNORMAL LOW (ref 12.0–15.0)
Immature Granulocytes: 1 %
Lymphocytes Relative: 19 %
Lymphs Abs: 1.2 10*3/uL (ref 0.7–4.0)
MCH: 23.6 pg — ABNORMAL LOW (ref 26.0–34.0)
MCHC: 28.6 g/dL — ABNORMAL LOW (ref 30.0–36.0)
MCV: 82.7 fL (ref 80.0–100.0)
Monocytes Absolute: 0.5 10*3/uL (ref 0.1–1.0)
Monocytes Relative: 8 %
Neutro Abs: 4.2 10*3/uL (ref 1.7–7.7)
Neutrophils Relative %: 66 %
Platelets: 527 10*3/uL — ABNORMAL HIGH (ref 150–400)
RBC: 4.74 MIL/uL (ref 3.87–5.11)
RDW: 18.3 % — ABNORMAL HIGH (ref 11.5–15.5)
WBC: 6.2 10*3/uL (ref 4.0–10.5)
nRBC: 0 % (ref 0.0–0.2)

## 2022-08-29 NOTE — Progress Notes (Signed)
Face to face contact with pt and husband in exam room. Pt reports that she is holding her own at present. Pt continues with hormonal therapy.

## 2022-08-30 ENCOUNTER — Telehealth: Payer: Self-pay

## 2022-08-30 LAB — CANCER ANTIGEN 27.29: CA 27.29: 57.7 U/mL — ABNORMAL HIGH (ref 0.0–38.6)

## 2022-08-30 NOTE — Telephone Encounter (Signed)
-----  Message from Juanetta Beets, [facility] sent at 08/30/2022 10:21 AM EST ----- Regarding: RE: K Will add for tomorrow with treatment.  Levada Dy - will you let her know that she will be here a couple of hours longer tomorrow?  ----- Message ----- From: Derwood Kaplan, MD Sent: 08/30/2022   9:11 AM EST To: Juanetta Beets, RPH; Belva Chimes, LPN Subject: K                                              She says she is compliant with oral K now and it is better but still only 3.4, so would like to give 20 IV

## 2022-08-30 NOTE — Telephone Encounter (Signed)
Patient notified of potassium results and that she will be here a little longer at the infusion room tomorrow.

## 2022-08-31 ENCOUNTER — Inpatient Hospital Stay: Payer: BC Managed Care – PPO

## 2022-08-31 VITALS — BP 115/81 | HR 92 | Temp 97.8°F | Resp 16 | Ht 59.0 in | Wt 130.1 lb

## 2022-08-31 DIAGNOSIS — Z5111 Encounter for antineoplastic chemotherapy: Secondary | ICD-10-CM | POA: Diagnosis not present

## 2022-08-31 DIAGNOSIS — C50412 Malignant neoplasm of upper-outer quadrant of left female breast: Secondary | ICD-10-CM

## 2022-08-31 DIAGNOSIS — C7951 Secondary malignant neoplasm of bone: Secondary | ICD-10-CM

## 2022-08-31 MED ORDER — DIPHENHYDRAMINE HCL 25 MG PO CAPS: 25.0000 mg | ORAL_CAPSULE | Freq: Once | ORAL | Status: DC

## 2022-08-31 MED ORDER — SODIUM CHLORIDE 0.9 % IV SOLN
420.0000 mg | Freq: Once | INTRAVENOUS | Status: AC
  Administered 2022-08-31: 420 mg via INTRAVENOUS
  Filled 2022-08-31: qty 14

## 2022-08-31 MED ORDER — HEPARIN SOD (PORK) LOCK FLUSH 100 UNIT/ML IV SOLN
500.0000 [IU] | Freq: Once | INTRAVENOUS | Status: AC | PRN
  Administered 2022-08-31: 500 [IU]

## 2022-08-31 MED ORDER — ACETAMINOPHEN 325 MG PO TABS: 650.0000 mg | ORAL_TABLET | Freq: Once | ORAL | Status: DC

## 2022-08-31 MED ORDER — TRASTUZUMAB-DKST CHEMO 150 MG IV SOLR
6.0000 mg/kg | Freq: Once | INTRAVENOUS | Status: AC
  Administered 2022-08-31: 357 mg via INTRAVENOUS
  Filled 2022-08-31: qty 17

## 2022-08-31 MED ORDER — POTASSIUM CHLORIDE 10 MEQ/100ML IV SOLN
10.0000 meq | INTRAVENOUS | Status: AC
  Administered 2022-08-31 (×2): 10 meq via INTRAVENOUS
  Filled 2022-08-31 (×2): qty 100

## 2022-08-31 MED ORDER — FULVESTRANT 250 MG/5ML IM SOSY
500.0000 mg | PREFILLED_SYRINGE | Freq: Once | INTRAMUSCULAR | Status: AC
  Administered 2022-08-31: 500 mg via INTRAMUSCULAR
  Filled 2022-08-31: qty 10

## 2022-08-31 MED ORDER — SODIUM CHLORIDE 0.9% FLUSH
10.0000 mL | INTRAVENOUS | Status: DC | PRN
  Administered 2022-08-31: 10 mL

## 2022-08-31 MED ORDER — SODIUM CHLORIDE 0.9 % IV SOLN: Freq: Once | INTRAVENOUS | Status: AC

## 2022-08-31 NOTE — Patient Instructions (Signed)
[facility]  Discharge Instructions: Thank you for choosing [facility] to provide your oncology and hematology care.  If you have a lab appointment with the [facility], please go directly to the [facility] and check in at the registration area.   Wear comfortable clothing and clothing appropriate for easy access to any Portacath or PICC line.   We strive to give you quality time with your provider. You may need to reschedule your appointment if you arrive late (15 or more minutes).  Arriving late affects you and other patients whose appointments are after yours.  Also, if you miss three or more appointments without notifying the office, you may be dismissed from the clinic at the provider's discretion.      For prescription refill requests, have your pharmacy contact our office and allow 72 hours for refills to be completed.    Today you received the following chemotherapy and/or immunotherapy agents Trastuzumab ([place]ri) & Pertuzumab      To help prevent nausea and vomiting after your treatment, we encourage you to take your nausea medication as directed.  BELOW ARE SYMPTOMS THAT SHOULD BE REPORTED IMMEDIATELY: *FEVER GREATER THAN 100.4 F (38 C) OR HIGHER *CHILLS OR SWEATING *NAUSEA AND VOMITING THAT IS NOT CONTROLLED WITH YOUR NAUSEA MEDICATION *UNUSUAL SHORTNESS OF BREATH *UNUSUAL BRUISING OR BLEEDING *URINARY PROBLEMS (pain or burning when urinating, or frequent urination) *BOWEL PROBLEMS (unusual diarrhea, constipation, pain near the anus) TENDERNESS IN MOUTH AND THROAT WITH OR WITHOUT PRESENCE OF ULCERS (sore throat, sores in mouth, or a toothache) UNUSUAL RASH, SWELLING OR PAIN  UNUSUAL VAGINAL DISCHARGE OR ITCHING   Items with * indicate a potential emergency and should be followed up as soon as possible or go to the Emergency Department if any problems should occur.  Please show the CHEMOTHERAPY ALERT CARD or IMMUNOTHERAPY ALERT CARD  at check-in to the Emergency Department and triage nurse.  Should you have questions after your visit or need to cancel or reschedule your appointment, please contact [facility]  Dept: [phone]  and follow the prompts.  Office hours are 8:00 a.m. to 4:30 p.m. Monday - Friday. Please note that voicemails left after 4:00 p.m. may not be returned until the following business day.  We are closed weekends and major holidays. You have access to a nurse at all times for urgent questions. Please call the main number to the clinic Dept: [phone] and follow the prompts.  For any non-urgent questions, you may also contact your provider using MyChart. We now offer e-Visits for anyone 87 and older to request care online for non-urgent symptoms. For details visit [email or web address].   Also download the MyChart app! Go to the app store, search "MyChart", open the app, select [facility], and log in with your MyChart username and password.

## 2022-09-06 ENCOUNTER — Other Ambulatory Visit: Payer: Self-pay | Admitting: Oncology

## 2022-09-06 DIAGNOSIS — C7951 Secondary malignant neoplasm of bone: Secondary | ICD-10-CM

## 2022-09-06 DIAGNOSIS — M545 Low back pain, unspecified: Secondary | ICD-10-CM

## 2022-09-06 DIAGNOSIS — R0789 Other chest pain: Secondary | ICD-10-CM

## 2022-09-07 ENCOUNTER — Ambulatory Visit: Payer: BC Managed Care – PPO

## 2022-09-11 ENCOUNTER — Encounter: Payer: Self-pay | Admitting: Oncology

## 2022-09-13 ENCOUNTER — Encounter: Payer: Self-pay | Admitting: Oncology

## 2022-09-14 ENCOUNTER — Encounter: Payer: Self-pay | Admitting: Oncology

## 2022-09-14 ENCOUNTER — Inpatient Hospital Stay: Payer: BC Managed Care – PPO | Attending: Hematology and Oncology

## 2022-09-14 VITALS — BP 105/79 | HR 91 | Temp 98.2°F | Resp 14 | Ht 59.0 in | Wt 132.0 lb

## 2022-09-14 DIAGNOSIS — C50412 Malignant neoplasm of upper-outer quadrant of left female breast: Secondary | ICD-10-CM | POA: Diagnosis not present

## 2022-09-14 DIAGNOSIS — Z170 Estrogen receptor positive status [ER+]: Secondary | ICD-10-CM | POA: Insufficient documentation

## 2022-09-14 DIAGNOSIS — C7951 Secondary malignant neoplasm of bone: Secondary | ICD-10-CM | POA: Diagnosis not present

## 2022-09-14 DIAGNOSIS — Z5111 Encounter for antineoplastic chemotherapy: Secondary | ICD-10-CM | POA: Insufficient documentation

## 2022-09-14 DIAGNOSIS — E876 Hypokalemia: Secondary | ICD-10-CM | POA: Diagnosis not present

## 2022-09-14 DIAGNOSIS — Z5112 Encounter for antineoplastic immunotherapy: Secondary | ICD-10-CM | POA: Diagnosis not present

## 2022-09-14 MED ORDER — ALBUTEROL SULFATE (2.5 MG/3ML) 0.083% IN NEBU: 2.5000 mg | INHALATION_SOLUTION | Freq: Once | RESPIRATORY_TRACT | Status: DC | PRN

## 2022-09-14 MED ORDER — FULVESTRANT 250 MG/5ML IM SOSY
500.0000 mg | PREFILLED_SYRINGE | Freq: Once | INTRAMUSCULAR | Status: AC
  Administered 2022-09-14: 250 mg via INTRAMUSCULAR
  Filled 2022-09-14: qty 10

## 2022-09-14 MED ORDER — EPINEPHRINE 0.3 MG/0.3ML IJ SOAJ: 0.3000 mg | Freq: Once | INTRAMUSCULAR | Status: DC | PRN

## 2022-09-14 MED ORDER — DENOSUMAB 120 MG/1.7ML ~~LOC~~ SOLN
120.0000 mg | Freq: Once | SUBCUTANEOUS | Status: AC
  Administered 2022-09-14: 120 mg via SUBCUTANEOUS
  Filled 2022-09-14: qty 1.7

## 2022-09-14 MED ORDER — SODIUM CHLORIDE 0.9 % IV SOLN: Freq: Once | INTRAVENOUS | Status: DC | PRN

## 2022-09-14 MED ORDER — DIPHENHYDRAMINE HCL 50 MG/ML IJ SOLN: 50.0000 mg | Freq: Once | INTRAMUSCULAR | Status: DC | PRN

## 2022-09-14 MED ORDER — DIPHENHYDRAMINE HCL 50 MG/ML IJ SOLN: 25.0000 mg | Freq: Once | INTRAMUSCULAR | Status: DC | PRN

## 2022-09-14 MED ORDER — METHYLPREDNISOLONE SODIUM SUCC 125 MG IJ SOLR: 125.0000 mg | Freq: Once | INTRAMUSCULAR | Status: DC | PRN

## 2022-09-17 ENCOUNTER — Encounter: Payer: Self-pay | Admitting: Oncology

## 2022-09-17 NOTE — Progress Notes (Signed)
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 She was on adjuvant hormonal therapy with tamoxifen 20 mg daily from October 2015 to May 2020, but due to elevation of the liver transaminases, was switched to anastrazole.   Malignant neoplasm metastatic to bone [facility]) New bone metastases, November 2022, with multiple marrow replacing lesions within the proximal right femur within the ischium as well as the inferior rami. 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 Ki67 was 60%. PET imaging from December 19th revealed dominant finding of intensely hypermetabolic aggressive skeletal metastasis involving the calvarium, sternum, thoracic and lumbar spine, and pelvis, with potential pathologic fracture of the right femur with internal fixation. There is soft tissue extension into the anterior mediastinum associated with the manubrial metastasis. Multiple spinal vertebral body lesions have cortical destruction along the posterior margin. She started monthly Xgeva in December, and completed radiation therapy. She was receiving THP but cycle 4 was delayed and dose reduction made by 20% due to left leg infection. She will continue with Herceptin/ Perjeta, but we dropped the Taxotere chemotherapy. We then placed her on hormonal therapy with Faslodex injections.    Neck pain She then developed problems with stiffness of her neck and pain that she rated as a 7 out of 10.  She was having trouble holding her neck up. She was evaluated by Dr. Gladstone Lighter with [facility]. He provided her with a cervical collar. She had physical therapy and this has improved.  Increased Left Hip Pain In January 2024, she developed increased pain of the left hip. She was found to have innumerable metastatic lesions throughout the pelvis and proximal femur. There is a non displaced pathologic fracture through a large lesion in the superior left acetabulum. She was referred to an orthopedic oncologist at [facility] and they recommended radiation and no surgery. She is now on palliative radiation.   Hypokalemia Her potassium came up from 3.4 to 3.5 last time despite taking potassium supplement at 20 mEq twice daily. I instructed her to increase it to 3 daily.  Hypocalcemia Her calcium is borderline at 8.8 but this is with taking 6 tablets daily.  Her vitamin D level was adequate.  I recommended she increase her calcium supplement to 3 tablets TID.  I stressed the importance of her calcium supplement.  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  She was found to be iron deficient in September 2023 and was given IV iron supplement. Her hemoglobin is stable at 11.0 now.  Ulceration of the right upper nose The patient believes this is from her glasses but this appears to be a fairly deep ulceration which has now resolved with removing her glasses.    Abnormal Mammogram She does have thickening of the skin and trabecular thickening. I find no significant change on physical  exam and feel that a lot of this can be post radiation change. In view of her wide spread bone metastases, I will not pursue further evaluation but will continue periodic breast exams, I have explained this ot the  patient and her husband.   Large Cyst of the Right Kidney This arises from the lower pole and hs been present for a long time, previously measuring 11 mm on last scan from September, 2023. During her CT simulation for her left hip radiation, the physicist noted that the cyst is enlarged and so we are due for follow up imaging anyway, and will re-evaluate. I also noted decreased breath sounds at both bases today and so would like to evaluate for possible pleural effusion vs atelectasis.   Plan:  I will schedule her a CT chest, abdomen, and pelvis scan on 09/26/2022. She had labs done yesterday 09/18/2022. I instructed her to increase her potassium to 3 gummies a day. Her CMP were otherwise normal. Her CA 27.29 went from 107 at the beginning of 2023 to 50.9 in November, 2023 and has gone up to 57.7 in January, 2024.  Her day 1 cycle 17 is scheduled 09/21/2022. I will see her in 3 weeks with CBC, CMP, and CA 27.29. She will continue the oxycodone 10 mg every 4hrs. The patient understands the plans discussed today and is in agreement with them.  She knows to contact our office if she develops concerns prior to her next appointment.  I provided 20 minutes of face-to-face time during this encounter and > 50% was spent counseling as documented under my assessment and plan.    Derwood Kaplan, MD  [facility] [address] [place] [address] Dept: [phone] Dept Fax: [phone]   No orders of the defined types were placed in this encounter.     CHIEF COMPLAINT:  CC: A 52 year old female with history of breast cancer here for 1 week evaluation  Current Treatment:  THP; will proceed with HP  INTERVAL HISTORY:  Anna [name] is here today for repeat clinical assessment for her breast cancer metastatic to bone.  Patient states that she is feeling ok and complaints that her skin is getting really dry and she is having pain of the  back and hip that she rates a 4/10 and continues to take oxycodone around the clock. She is on her 5th treatment of radiation out of 10 planned to the left hip. We had placed her on hormonal therapy with Faslodex injections.  The small ulcer of her right upper nose has healed. Her SIM CT revealed that the large Bosniak category 1 cyst arising from the lower pole of the right kidney measured 11 x 10 mm, and now has increased to 11.5 x 14 mm. I informed her we will need to do another CT scan to find out more information, and she is due anyway. She does have bilateral decreased breath sounds at the base of both lungs so I will schedule a CT chest, abdomen, and pelvis scan on 09/26/2022. Her potassium went from 3.4 to 3.5 and she is currently taking 2 pills of 20 meq potassium chloride and I suggested she take 3 a day. She has begun taking calcium gummies, and I instructed her to increase the dose.  Her CMP were otherwise normal. Her CA 27.29 went from 107 at the beginning of 2023 to 50.9 in November, 2023 and has gone up to 57.7 in January,  2024. She had labs done yesterday 09/18/2022. Her day 1 cycle 17 is scheduled 09/21/2022. I will see her in 3 weeks with CBC, CMP, and CA 27.29. She denies signs of infection such as sore throat, sinus drainage, cough, or urinary symptoms.  She denies fevers or recurrent chills. She denies pain. She denies nausea, vomiting, chest pain, dyspnea or cough. Her weight has been stable. She is accompanied today by her husband as usual.   I have reviewed the past medical history, past surgical history, social history and family history with the patient and they are unchanged from previous note.  ALLERGIES:  has No Known Allergies.  MEDICATIONS:  Current Outpatient Medications  Medication Sig Dispense Refill   albuterol (VENTOLIN HFA) 108 (90 Base) MCG/ACT inhaler Inhale 2 puffs into the lungs every 6 (six) hours as needed for shortness of breath.     ARIPiprazole (ABILIFY) 5 MG  tablet Take 2.5 mg by mouth daily.     aspirin 81 MG EC tablet Take 81 mg by mouth daily. Swallow whole.     atorvastatin (LIPITOR) 20 MG tablet TAKE ONE TABLET BY MOUTH EVERY DAY 90 tablet 0   augmented betamethasone dipropionate (DIPROLENE-AF) 0.05 % cream      calcium carbonate (OS-CAL) 1250 (500 Ca) MG chewable tablet Chew 6 tablets by mouth daily.     CLENPIQ 10-3.5-12 MG-GM -GM/160ML SOLN      desvenlafaxine (PRISTIQ) 50 MG 24 hr tablet Take 50 mg by mouth daily.     diphenoxylate-atropine (LOMOTIL) 2.5-0.025 MG tablet TAKE TWO TABLETS BY MOUTH FOUR TIMES A DAY AS NEEDED FOR DIARRHEA OR LOOSE STOOLS 100 tablet 1   FARXIGA 10 MG TABS tablet Take 1 tablet (10 mg total) by mouth daily. 30 tablet 11   furosemide (LASIX) 20 MG tablet Take 1 tablet (20 mg total) by mouth daily. 30 tablet 0   labetalol (NORMODYNE) 200 MG tablet Take 1 tablet (200 mg total) by mouth 2 (two) times daily. 180 tablet 3   Loratadine 10 MG CAPS Take 1 capsule (10 mg total) by mouth daily. 30 capsule 6   LORazepam (ATIVAN) 1 MG tablet Take 1 tablet (1 mg total) by mouth every 8 (eight) hours. 30 tablet 0   magic mouthwash (lidocaine, diphenhydrAMINE, alum & mag hydroxide) suspension Take 5 mLs by mouth every 3 (three) hours as needed. Swish and spit; for throat/mouth pain     naloxone (NARCAN) nasal spray 4 mg/0.1 mL One spray in nostril as needed, may repeat every 2 to 3 minutes until medical assistance becomes available 4 each 1   nitroGLYCERIN (NITROSTAT) 0.4 MG SL tablet Place 0.4 mg under the tongue every 5 (five) minutes as needed for chest pain.     omeprazole (PRILOSEC) 40 MG capsule      ondansetron (ZOFRAN) 4 MG tablet Take 1 tablet (4 mg total) by mouth every 4 (four) hours as needed for nausea. 90 tablet 3   Oxycodone HCl 10 MG TABS TAKE ONE TABLET BY MOUTH EVERY 4 HOURS AS NEEDED 180 tablet 0   OZEMPIC, 0.25 OR 0.5 MG/DOSE, 2 MG/3ML SOPN Inject into the skin.     potassium chloride SA (KLOR-CON M) 20 MEQ  tablet Take 1 tablet (20 mEq total) by mouth 2 (two) times daily. 60 tablet 0   prochlorperazine (COMPAZINE) 10 MG tablet Take 1 tablet (10 mg total) by mouth every 6 (six) hours as needed for nausea or vomiting. 90 tablet 3   valsartan (DIOVAN) 40 MG  tablet TAKE ONE TABLET BY MOUTH EVERY DAY 90 tablet 0   Current Facility-Administered Medications  Medication Dose Route Frequency Provider Last Rate Last Admin   0.9 %  sodium chloride infusion  500 mL Intravenous Once Jackquline Denmark, MD       technetium sestamibi generic (CARDIOLITE) injection [id number] millicurie  [id number] millicurie Intravenous Once PRN Revankar, Reita Cliche, MD        HISTORY OF PRESENT ILLNESS:   Oncology History  Malignant neoplasm of upper-outer quadrant of left female breast ([place])  07/23/2013 Initial Diagnosis   Malignant neoplasm of upper-outer quadrant of left female breast ([place])   07/23/2013 Cancer Staging   Staging form: Breast, AJCC 7th Edition - Clinical stage from 07/23/2013: Stage IIB (T2, N1, M0) - Signed by Derwood Kaplan, MD on 07/04/2020 Prognostic indicators: Pos LVI   09/22/2021 - 04/06/2022 Chemotherapy   Patient is on Treatment Plan : BREAST  Trastuzumab + Pertuzumab q21d      09/22/2021 -  Chemotherapy   Patient is on Treatment Plan : BREAST Trastuzumab + Pertuzumab q21d     Malignant neoplasm metastatic to bone ([place])  06/28/2021 Initial Diagnosis   Bone metastases ([place])   09/22/2021 - 04/06/2022 Chemotherapy   Patient is on Treatment Plan : BREAST  Trastuzumab + Pertuzumab q21d      09/22/2021 -  Chemotherapy   Patient is on Treatment Plan : BREAST Trastuzumab + Pertuzumab q21d     05/11/2022 Imaging   CT chest, abdomen and pelvis:  IMPRESSION:  1. Widespread bony metastatic disease shows increased sclerosis  since previous imaging. This is likely related to response to  therapy in the interval. There is a single lesion along the LEFT  iliac which shows continued lytic change and warrants  attention on  follow-up  2. Interval development of pathologic fractures at in the upper  thoracic spine and at the thoracolumbar junction with associated  kyphosis.  3. No signs of solid organ or nodal metastatic disease.  4. Marked elevation of the LEFT hemidiaphragm as on the prior PET.  5. Aortic atherosclerosis.        REVIEW OF SYSTEMS:  Review of Systems  Constitutional:  Negative for appetite change, chills, diaphoresis, fatigue, fever and unexpected weight change.  HENT:  Negative.  Negative for hearing loss, lump/mass, mouth sores, nosebleeds, sore throat, tinnitus, trouble swallowing and voice change.   Eyes: Negative.  Negative for eye problems and icterus.  Respiratory:  Positive for shortness of breath (mild). Negative for chest tightness, cough, hemoptysis and wheezing.   Cardiovascular: Negative.  Negative for chest pain, leg swelling and palpitations.  Gastrointestinal: Negative.  Negative for abdominal distention, abdominal pain, blood in stool, constipation, diarrhea, nausea, rectal pain and vomiting.  Endocrine: Negative.   Genitourinary: Negative.  Negative for bladder incontinence, difficulty urinating, dyspareunia, dysuria, frequency, hematuria, menstrual problem, nocturia, pelvic pain, vaginal bleeding and vaginal discharge.   Musculoskeletal:  Positive for back pain (5/10 and pain in her left hip.). Negative for arthralgias, flank pain, gait problem, myalgias, neck pain and neck stiffness.       This is severe and the MRI is extremely abnormal with a non displaced pathologic fracture through the superior left acetabulum. Persistent pain of the left hip.  Skin: Negative.  Negative for itching, rash and wound.       Severe dry skin.  Neurological: Negative.  Negative for dizziness, extremity weakness, gait problem, headaches, light-headedness, numbness, seizures and speech difficulty.  Hematological: Negative.  Negative  for adenopathy. Does not bruise/bleed easily.   Psychiatric/Behavioral: Negative.  Negative for confusion, decreased concentration, depression, sleep disturbance and suicidal ideas. The patient is not nervous/anxious.    VITALS:  Blood pressure 113/78, pulse 95, temperature 98.5 F (36.9 C), temperature source Oral, resp. rate 18, height 4' 11"$  (1.499 m), weight 133 lb 8 oz (60.6 kg), last menstrual period 08/18/2013, SpO2 96 %.  Wt Readings from Last 3 Encounters:  09/21/22 135 lb 0.6 oz (61.3 kg)  09/19/22 133 lb 8 oz (60.6 kg)  09/14/22 132 lb (59.9 kg)    Body mass index is 26.96 kg/m.  Performance status (ECOG): 1 - Symptomatic but completely ambulatory  PHYSICAL EXAM:  Physical Exam Vitals and nursing note reviewed. Exam conducted with a chaperone present.  Constitutional:      General: She is not in acute distress.    Appearance: Normal appearance. She is normal weight. She is not ill-appearing, toxic-appearing or diaphoretic.  HENT:     Head: Normocephalic and atraumatic.     Right Ear: Tympanic membrane, ear canal and external ear normal. There is no impacted cerumen.     Left Ear: Tympanic membrane, ear canal and external ear normal. There is no impacted cerumen.     Nose: Nose normal. No congestion or rhinorrhea.     Comments: Small ulcer of her right upper nose has healed.     Mouth/Throat:     Mouth: Mucous membranes are moist.     Pharynx: Oropharynx is clear. No oropharyngeal exudate or posterior oropharyngeal erythema.  Eyes:     General: No scleral icterus.       Right eye: No discharge.        Left eye: No discharge.     Extraocular Movements: Extraocular movements intact.     Conjunctiva/sclera: Conjunctivae normal.     Pupils: Pupils are equal, round, and reactive to light.  Neck:     Vascular: No carotid bruit.  Cardiovascular:     Rate and Rhythm: Normal rate and regular rhythm.     Pulses: Normal pulses.     Heart sounds: Normal heart sounds. No murmur heard.    No friction rub. No gallop.   Pulmonary:     Effort: Pulmonary effort is normal. No respiratory distress.     Breath sounds: Normal breath sounds. Decreased air movement present. No stridor. No wheezing, rhonchi or rales.     Comments: bilateral decreased breath sounds at the base of both lungs. Chest:     Chest wall: No tenderness.     Comments: Well healed scar in the upper outer of the left breast. Firmness diffusely in the left breast consistent with post radiation changes and the thickening of skin in both breast.  Abdominal:     General: Bowel sounds are normal. There is no distension.     Palpations: Abdomen is soft. There is no hepatomegaly, splenomegaly or mass.     Tenderness: There is no abdominal tenderness. There is no right CVA tenderness, left CVA tenderness, guarding or rebound.     Hernia: No hernia is present.  Musculoskeletal:        General: No swelling, tenderness, deformity or signs of injury. Normal range of motion.     Cervical back: Normal range of motion and neck supple. No rigidity or tenderness.     Right lower leg: No edema.     Left lower leg: No edema.     Comments: Legs look stable still very erythematous and  dry with some edema and induration.  Lymphadenopathy:     Cervical: No cervical adenopathy.     Right cervical: No superficial, deep or posterior cervical adenopathy.    Left cervical: No superficial, deep or posterior cervical adenopathy.     Upper Body:     Right upper body: No supraclavicular, axillary or pectoral adenopathy.     Left upper body: No supraclavicular, axillary or pectoral adenopathy.  Skin:    General: Skin is warm and dry.     Coloration: Skin is not jaundiced or pale.     Findings: No bruising, erythema, lesion or rash.     Comments: Very dry  Neurological:     General: No focal deficit present.     Mental Status: She is alert and oriented to person, place, and time. Mental status is at baseline.     Cranial Nerves: No cranial nerve deficit.      Sensory: No sensory deficit.     Motor: No weakness.     Coordination: Coordination normal.     Gait: Gait normal.     Deep Tendon Reflexes: Reflexes normal.  Psychiatric:        Mood and Affect: Mood normal.        Behavior: Behavior normal.        Thought Content: Thought content normal.        Judgment: Judgment normal.    LABORATORY DATA:  I have reviewed the data as listed Component Ref Range & Units 1 d ago (09/18/22) 3 wk ago (08/29/22) 1 mo ago (08/08/22) 2 mo ago (07/18/22)  WBC 4.0 - 10.5 K/uL 5.4 6.2 6.3 6.7  RBC 3.87 - 5.11 MIL/uL 4.56 4.74 4.23 4.30  Hemoglobin 12.0 - 15.0 g/dL 11.0 Low  11.2 Low  10.2 Low  10.4 Low   HCT 36.0 - 46.0 % 37.6 39.2 35.2 Low  36.1  MCV 80.0 - 100.0 fL 82.5 82.7 83.2 84.0  MCH 26.0 - 34.0 pg 24.1 Low  23.6 Low  24.1 Low  24.2 Low   MCHC 30.0 - 36.0 g/dL 29.3 Low  28.6 Low  29.0 Low  28.8 Low   RDW 11.5 - 15.5 % 20.0 High  18.3 High  17.8 High  18.0 High   Platelets 150 - 400 K/uL 203 527 High        Component Ref Range & Units 1 d ago (09/18/22) 3 wk ago (08/29/22) 1 mo ago (08/08/22) 2 mo ago (07/18/22)  Sodium 135 - 145 mmol/L 140 142 143 143  Potassium 3.5 - 5.1 mmol/L 3.5 3.4 Low  3.2 Low  3.7  Chloride 98 - 111 mmol/L 104 104 106 105  CO2 22 - 32 mmol/L 28 30 30 30  $ Glucose, Bld 70 - 99 mg/dL 90 78 CM 87 CM 86 CM  BUN 6 - 20 mg/dL 15 10 14 13  $ Creatinine, Ser 0.44 - 1.00 mg/dL 0.59 0.74 0.60 0.60  Calcium 8.9 - 10.3 mg/dL 8.4 Low  8.7 Low  8.8 Low  8.8 Low   Total Protein 6.5 - 8.1 g/dL 6.9 7.5 6.7 7.2  Albumin 3.5 - 5.0 g/dL 3.7 3.5 3.3 Low  3.6  AST 15 - 41 U/L 17 23 21 24  $ ALT 0 - 44 U/L 15 24 28 $ 36  Alkaline Phosphatase 38 - 126 U/L 117 261 High  226 High          Component Value Date/Time   NA 140 09/18/2022 1044   NA 138  06/05/2022 0000   K 3.5 09/18/2022 1044   CL 104 09/18/2022 1044   CO2 28 09/18/2022 1044   GLUCOSE 90 09/18/2022 1044   BUN 15 09/18/2022 1044   BUN 18 06/05/2022 0000   CREATININE 0.59  09/18/2022 1044   CREATININE 0.65 07/11/2022 1116   CALCIUM 8.4 (L) 09/18/2022 1044   PROT 6.9 09/18/2022 1044   ALBUMIN 3.7 09/18/2022 1044   AST 17 09/18/2022 1044   AST 42 (H) 07/11/2022 1116   ALT 15 09/18/2022 1044   ALT 54 (H) 07/11/2022 1116   ALKPHOS 117 09/18/2022 1044   BILITOT 0.4 09/18/2022 1044   BILITOT 0.3 07/11/2022 1116   GFRNONAA >60 09/18/2022 1044   GFRNONAA >60 07/11/2022 1116    No results found for: "SPEP", "UPEP"  Lab Results  Component Value Date   WBC 5.4 09/18/2022   NEUTROABS 3.7 09/18/2022   HGB 11.0 (L) 09/18/2022   HCT 37.6 09/18/2022   MCV 82.5 09/18/2022   PLT 203 09/18/2022   Component Ref Range & Units 2 wk ago (08/29/22) 2 mo ago (06/28/22) 1 yr ago (09/06/21) 1 yr ago (07/20/21)  CA 27.29 0.0 - 38.6 U/mL 57.7 High  50.9 High  CM 106.6 High  CM 75.6 High  CM       Chemistry      Component Value Date/Time   NA 140 09/18/2022 1044   NA 138 06/05/2022 0000   K 3.5 09/18/2022 1044   CL 104 09/18/2022 1044   CO2 28 09/18/2022 1044   BUN 15 09/18/2022 1044   BUN 18 06/05/2022 0000   CREATININE 0.59 09/18/2022 1044   CREATININE 0.65 07/11/2022 1116   GLU 117 06/05/2022 0000      Component Value Date/Time   CALCIUM 8.4 (L) 09/18/2022 1044   ALKPHOS 117 09/18/2022 1044   AST 17 09/18/2022 1044   AST 42 (H) 07/11/2022 1116   ALT 15 09/18/2022 1044   ALT 54 (H) 07/11/2022 1116   BILITOT 0.4 09/18/2022 1044   BILITOT 0.3 07/11/2022 1116      RADIOGRAPHIC STUDIES:       I,Jasmine M Lassiter,acting as a scribe for Derwood Kaplan, MD.,have documented all relevant documentation on the behalf of Derwood Kaplan, MD,as directed by  Derwood Kaplan, MD while in the presence of Derwood Kaplan, MD.

## 2022-09-18 ENCOUNTER — Inpatient Hospital Stay: Payer: BC Managed Care – PPO

## 2022-09-18 DIAGNOSIS — Z170 Estrogen receptor positive status [ER+]: Secondary | ICD-10-CM

## 2022-09-18 DIAGNOSIS — Z5111 Encounter for antineoplastic chemotherapy: Secondary | ICD-10-CM | POA: Diagnosis not present

## 2022-09-18 DIAGNOSIS — C7951 Secondary malignant neoplasm of bone: Secondary | ICD-10-CM

## 2022-09-18 LAB — CBC WITH DIFFERENTIAL/PLATELET
Abs Immature Granulocytes: 0.01 10*3/uL (ref 0.00–0.07)
Basophils Absolute: 0 10*3/uL (ref 0.0–0.1)
Basophils Relative: 0 %
Eosinophils Absolute: 0.3 10*3/uL (ref 0.0–0.5)
Eosinophils Relative: 5 %
HCT: 37.6 % (ref 36.0–46.0)
Hemoglobin: 11 g/dL — ABNORMAL LOW (ref 12.0–15.0)
Immature Granulocytes: 0 %
Lymphocytes Relative: 20 %
Lymphs Abs: 1.1 10*3/uL (ref 0.7–4.0)
MCH: 24.1 pg — ABNORMAL LOW (ref 26.0–34.0)
MCHC: 29.3 g/dL — ABNORMAL LOW (ref 30.0–36.0)
MCV: 82.5 fL (ref 80.0–100.0)
Monocytes Absolute: 0.3 10*3/uL (ref 0.1–1.0)
Monocytes Relative: 6 %
Neutro Abs: 3.7 10*3/uL (ref 1.7–7.7)
Neutrophils Relative %: 69 %
Platelets: 203 10*3/uL (ref 150–400)
RBC: 4.56 MIL/uL (ref 3.87–5.11)
RDW: 20 % — ABNORMAL HIGH (ref 11.5–15.5)
WBC: 5.4 10*3/uL (ref 4.0–10.5)
nRBC: 0 % (ref 0.0–0.2)

## 2022-09-18 LAB — COMPREHENSIVE METABOLIC PANEL
ALT: 15 U/L (ref 0–44)
AST: 17 U/L (ref 15–41)
Albumin: 3.7 g/dL (ref 3.5–5.0)
Alkaline Phosphatase: 117 U/L (ref 38–126)
Anion gap: 8 (ref 5–15)
BUN: 15 mg/dL (ref 6–20)
CO2: 28 mmol/L (ref 22–32)
Calcium: 8.4 mg/dL — ABNORMAL LOW (ref 8.9–10.3)
Chloride: 104 mmol/L (ref 98–111)
Creatinine, Ser: 0.59 mg/dL (ref 0.44–1.00)
GFR, Estimated: >60 mL/min (ref >60)
Glucose, Bld: 90 mg/dL (ref 70–99)
Potassium: 3.5 mmol/L (ref 3.5–5.1)
Sodium: 140 mmol/L (ref 135–145)
Total Bilirubin: 0.4 mg/dL (ref 0.3–1.2)
Total Protein: 6.9 g/dL (ref 6.5–8.1)

## 2022-09-19 ENCOUNTER — Inpatient Hospital Stay (INDEPENDENT_AMBULATORY_CARE_PROVIDER_SITE_OTHER): Payer: BC Managed Care – PPO | Admitting: Oncology

## 2022-09-19 ENCOUNTER — Other Ambulatory Visit: Payer: Self-pay | Admitting: Oncology

## 2022-09-19 ENCOUNTER — Encounter: Payer: Self-pay | Admitting: Oncology

## 2022-09-19 ENCOUNTER — Ambulatory Visit: Payer: BC Managed Care – PPO | Admitting: Oncology

## 2022-09-19 VITALS — BP 113/78 | HR 95 | Temp 98.5°F | Resp 18 | Ht 59.0 in | Wt 133.5 lb

## 2022-09-19 DIAGNOSIS — C7951 Secondary malignant neoplasm of bone: Secondary | ICD-10-CM

## 2022-09-19 DIAGNOSIS — C50412 Malignant neoplasm of upper-outer quadrant of left female breast: Secondary | ICD-10-CM

## 2022-09-19 DIAGNOSIS — Z170 Estrogen receptor positive status [ER+]: Secondary | ICD-10-CM

## 2022-09-20 ENCOUNTER — Other Ambulatory Visit: Payer: Self-pay

## 2022-09-20 ENCOUNTER — Other Ambulatory Visit: Payer: Self-pay | Admitting: Pharmacist

## 2022-09-20 ENCOUNTER — Telehealth: Payer: Self-pay | Admitting: Oncology

## 2022-09-20 ENCOUNTER — Telehealth: Payer: Self-pay

## 2022-09-20 DIAGNOSIS — Z170 Estrogen receptor positive status [ER+]: Secondary | ICD-10-CM

## 2022-09-20 MED FILL — Pertuzumab Soln for IV Infusion 420 MG/14ML (30 MG/ML): INTRAVENOUS | Qty: 14 | Status: AC

## 2022-09-20 MED FILL — Trastuzumab-dkst For IV Soln 420 MG: INTRAVENOUS | Qty: 17 | Status: AC

## 2022-09-20 NOTE — Telephone Encounter (Signed)
CT C/A/P has been scheduled for 09/25/22 @ 1:45 pm; Checking in @ 1:15 pm   Notified pt of date,time and instructions.

## 2022-09-20 NOTE — Telephone Encounter (Signed)
Medication list updated.

## 2022-09-20 NOTE — Telephone Encounter (Signed)
-----   Message from Derwood Kaplan, MD sent at 09/19/2022  1:47 PM EST ----- Regarding: meds I increased her K to tid

## 2022-09-21 ENCOUNTER — Inpatient Hospital Stay: Payer: BC Managed Care – PPO

## 2022-09-21 VITALS — BP 137/83 | HR 96 | Temp 99.2°F | Resp 14 | Ht 59.0 in | Wt 135.0 lb

## 2022-09-21 DIAGNOSIS — Z170 Estrogen receptor positive status [ER+]: Secondary | ICD-10-CM

## 2022-09-21 DIAGNOSIS — Z5111 Encounter for antineoplastic chemotherapy: Secondary | ICD-10-CM | POA: Diagnosis not present

## 2022-09-21 DIAGNOSIS — C7951 Secondary malignant neoplasm of bone: Secondary | ICD-10-CM

## 2022-09-21 MED ORDER — DIPHENHYDRAMINE HCL 25 MG PO CAPS: 25.0000 mg | ORAL_CAPSULE | Freq: Once | ORAL | Status: DC

## 2022-09-21 MED ORDER — SODIUM CHLORIDE 0.9 % IV SOLN: Freq: Once | INTRAVENOUS | Status: AC

## 2022-09-21 MED ORDER — HEPARIN SOD (PORK) LOCK FLUSH 100 UNIT/ML IV SOLN: 500.0000 [IU] | Freq: Once | INTRAVENOUS | Status: DC | PRN

## 2022-09-21 MED ORDER — ACETAMINOPHEN 325 MG PO TABS: 650.0000 mg | ORAL_TABLET | Freq: Once | ORAL | Status: DC

## 2022-09-21 MED ORDER — SODIUM CHLORIDE 0.9 % IV SOLN
420.0000 mg | Freq: Once | INTRAVENOUS | Status: AC
  Administered 2022-09-21: 420 mg via INTRAVENOUS
  Filled 2022-09-21: qty 14

## 2022-09-21 MED ORDER — SODIUM CHLORIDE 0.9% FLUSH: 10.0000 mL | INTRAVENOUS | Status: DC | PRN

## 2022-09-21 MED ORDER — TRASTUZUMAB-DKST CHEMO 420 MG IV SOLR
6.0000 mg/kg | Freq: Once | INTRAVENOUS | Status: AC
  Administered 2022-09-21: 357 mg via INTRAVENOUS
  Filled 2022-09-21: qty 17

## 2022-09-21 NOTE — Patient Instructions (Signed)
Pertuzumab Injection What is this medication? PERTUZUMAB (per TOOZ ue mab) treats breast cancer. It works by blocking a protein that causes cancer cells to grow and multiply. This helps to slow or stop the spread of cancer cells. It is a monoclonal antibody. This medicine may be used for other purposes; ask your health care provider or pharmacist if you have questions. COMMON BRAND NAME(S): PERJETA What should I tell my care team before I take this medication? They need to know if you have any of these conditions: Heart failure An unusual or allergic reaction to pertuzumab, other medications, foods, dyes, or preservatives Pregnant or trying to get pregnant Breast-feeding How should I use this medication? This medication is injected into a vein. It is given by your care team in a hospital or clinic setting. Talk to your care team about the use of this medication in children. Special care may be needed. Overdosage: If you think you have taken too much of this medicine contact a poison control center or emergency room at once. NOTE: This medicine is only for you. Do not share this medicine with others. What if I miss a dose? Keep appointments for follow-up doses. It is important not to miss your dose. Call your care team if you are unable to keep an appointment. What may interact with this medication? Interactions are not expected. This list may not describe all possible interactions. Give your health care provider a list of all the medicines, herbs, non-prescription drugs, or dietary supplements you use. Also tell them if you smoke, drink alcohol, or use illegal drugs. Some items may interact with your medicine. What should I watch for while using this medication? Your condition will be monitored carefully while you are receiving this medication. This medication may make you feel generally unwell. This is not uncommon as chemotherapy can affect healthy cells as well as cancer cells. Report any  side effects. Continue your course of treatment even though you feel ill unless your care team tells you to stop. Talk to your care team if you may be pregnant. Serious birth defects can occur if you take this medication during pregnancy and for 7 months after the last dose. You will need a negative pregnancy test before starting this medication. Contraception is recommended while taking this medication and for 7 months after the last dose. Your care team can help you find the option that works for you. Do not breastfeed while taking this medication and for 7 months after the last dose. What side effects may I notice from receiving this medication? Side effects that you should report to your care team as soon as possible: Allergic reactions or angioedema--skin rash, itching or hives, swelling of the face, eyes, lips, tongue, arms, or legs, trouble swallowing or breathing Heart failure--shortness of breath, swelling of the ankles, feet, or hands, sudden weight gain, unusual weakness or fatigue Infusion reactions--chest pain, shortness of breath or trouble breathing, feeling faint or lightheaded Side effects that usually do not require medical attention (report to your care team if they continue or are bothersome): Diarrhea Dry skin Fatigue Hair loss Nausea Vomiting This list may not describe all possible side effects. Call your doctor for medical advice about side effects. You may report side effects to FDA at 1-800-FDA-1088. Where should I keep my medication? This medication is given in a hospital or clinic. It will not be stored at home. NOTE: This sheet is a summary. It may not cover all possible information. If you have   questions about this medicine, talk to your doctor, pharmacist, or health care provider.  2023 Elsevier/Gold Standard (2021-12-12 00:00:00) Trastuzumab Injection What is this medication? TRASTUZUMAB (tras TOO zoo mab) treats breast cancer and stomach cancer. It works by  blocking a protein that causes cancer cells to grow and multiply. This helps to slow or stop the spread of cancer cells. This medicine may be used for other purposes; ask your health care provider or pharmacist if you have questions. COMMON BRAND NAME(S): Herceptin, Herzuma, KANJINTI, Ogivri, Ontruzant, Trazimera What should I tell my care team before I take this medication? They need to know if you have any of these conditions: Heart failure Lung disease An unusual or allergic reaction to trastuzumab, other medications, foods, dyes, or preservatives Pregnant or trying to get pregnant Breast-feeding How should I use this medication? This medication is injected into a vein. It is given by your care team in a hospital or clinic setting. Talk to your care team about the use of this medication in children. It is not approved for use in children. Overdosage: If you think you have taken too much of this medicine contact a poison control center or emergency room at once. NOTE: This medicine is only for you. Do not share this medicine with others. What if I miss a dose? Keep appointments for follow-up doses. It is important not to miss your dose. Call your care team if you are unable to keep an appointment. What may interact with this medication? Certain types of chemotherapy, such as daunorubicin, doxorubicin, epirubicin, idarubicin This list may not describe all possible interactions. Give your health care provider a list of all the medicines, herbs, non-prescription drugs, or dietary supplements you use. Also tell them if you smoke, drink alcohol, or use illegal drugs. Some items may interact with your medicine. What should I watch for while using this medication? Your condition will be monitored carefully while you are receiving this medication. This medication may make you feel generally unwell. This is not uncommon, as chemotherapy affects healthy cells as well as cancer cells. Report any side  effects. Continue your course of treatment even though you feel ill unless your care team tells you to stop. This medication may increase your risk of getting an infection. Call your care team for advice if you get a fever, chills, sore throat, or other symptoms of a cold or flu. Do not treat yourself. Try to avoid being around people who are sick. Avoid taking medications that contain aspirin, acetaminophen, ibuprofen, naproxen, or ketoprofen unless instructed by your care team. These medications can hide a fever. Talk to your care team if you may be pregnant. Serious birth defects can occur if you take this medication during pregnancy and for 7 months after the last dose. You will need a negative pregnancy test before starting this medication. Contraception is recommended while taking this medication and for 7 months after the last dose. Your care team can help you find the option that works for you. Do not breastfeed while taking this medication and for 7 months after stopping treatment. What side effects may I notice from receiving this medication? Side effects that you should report to your care team as soon as possible: Allergic reactions or angioedema--skin rash, itching or hives, swelling of the face, eyes, lips, tongue, arms, or legs, trouble swallowing or breathing Dry cough, shortness of breath or trouble breathing Heart failure--shortness of breath, swelling of the ankles, feet, or hands, sudden weight gain, unusual weakness   or fatigue Infection--fever, chills, cough, or sore throat Infusion reactions--chest pain, shortness of breath or trouble breathing, feeling faint or lightheaded Side effects that usually do not require medical attention (report to your care team if they continue or are bothersome): Diarrhea Dizziness Headache Nausea Trouble sleeping Vomiting This list may not describe all possible side effects. Call your doctor for medical advice about side effects. You may report  side effects to FDA at 1-800-FDA-1088. Where should I keep my medication? This medication is given in a hospital or clinic. It will not be stored at home. NOTE: This sheet is a summary. It may not cover all possible information. If you have questions about this medicine, talk to your doctor, pharmacist, or health care provider.  2023 Elsevier/Gold Standard (2021-11-30 00:00:00)  

## 2022-10-01 ENCOUNTER — Encounter: Payer: Self-pay | Admitting: Oncology

## 2022-10-08 ENCOUNTER — Other Ambulatory Visit: Payer: Self-pay | Admitting: Oncology

## 2022-10-08 DIAGNOSIS — M545 Low back pain, unspecified: Secondary | ICD-10-CM

## 2022-10-08 DIAGNOSIS — R0789 Other chest pain: Secondary | ICD-10-CM

## 2022-10-08 DIAGNOSIS — C7951 Secondary malignant neoplasm of bone: Secondary | ICD-10-CM

## 2022-10-08 MED ORDER — OXYCODONE HCL 10 MG PO TABS: 10.0000 mg | ORAL_TABLET | ORAL | 0 refills | Status: DC | PRN

## 2022-10-09 ENCOUNTER — Inpatient Hospital Stay: Payer: BC Managed Care – PPO

## 2022-10-09 DIAGNOSIS — Z5111 Encounter for antineoplastic chemotherapy: Secondary | ICD-10-CM | POA: Diagnosis not present

## 2022-10-09 DIAGNOSIS — C7951 Secondary malignant neoplasm of bone: Secondary | ICD-10-CM

## 2022-10-09 DIAGNOSIS — Z170 Estrogen receptor positive status [ER+]: Secondary | ICD-10-CM

## 2022-10-09 LAB — COMPREHENSIVE METABOLIC PANEL
ALT: 22 U/L (ref 0–44)
AST: 23 U/L (ref 15–41)
Albumin: 3.8 g/dL (ref 3.5–5.0)
Alkaline Phosphatase: 89 U/L (ref 38–126)
Anion gap: 6 (ref 5–15)
BUN: 16 mg/dL (ref 6–20)
CO2: 29 mmol/L (ref 22–32)
Calcium: 8.8 mg/dL — ABNORMAL LOW (ref 8.9–10.3)
Chloride: 104 mmol/L (ref 98–111)
Creatinine, Ser: 0.66 mg/dL (ref 0.44–1.00)
GFR, Estimated: >60 mL/min (ref >60)
Glucose, Bld: 106 mg/dL — ABNORMAL HIGH (ref 70–99)
Potassium: 4.2 mmol/L (ref 3.5–5.1)
Sodium: 139 mmol/L (ref 135–145)
Total Bilirubin: 0.4 mg/dL (ref 0.3–1.2)
Total Protein: 6.9 g/dL (ref 6.5–8.1)

## 2022-10-09 LAB — CBC WITH DIFFERENTIAL (CANCER CENTER ONLY)
Abs Immature Granulocytes: 0.03 10*3/uL (ref 0.00–0.07)
Basophils Absolute: 0 10*3/uL (ref 0.0–0.1)
Basophils Relative: 0 %
Eosinophils Absolute: 0.3 10*3/uL (ref 0.0–0.5)
Eosinophils Relative: 6 %
HCT: 41.2 % (ref 36.0–46.0)
Hemoglobin: 12.1 g/dL (ref 12.0–15.0)
Immature Granulocytes: 1 %
Lymphocytes Relative: 18 %
Lymphs Abs: 0.9 10*3/uL (ref 0.7–4.0)
MCH: 25 pg — ABNORMAL LOW (ref 26.0–34.0)
MCHC: 29.4 g/dL — ABNORMAL LOW (ref 30.0–36.0)
MCV: 85.1 fL (ref 80.0–100.0)
Monocytes Absolute: 0.4 10*3/uL (ref 0.1–1.0)
Monocytes Relative: 8 %
Neutro Abs: 3.3 10*3/uL (ref 1.7–7.7)
Neutrophils Relative %: 67 %
Platelet Count: 212 10*3/uL (ref 150–400)
RBC: 4.84 MIL/uL (ref 3.87–5.11)
RDW: 20.4 % — ABNORMAL HIGH (ref 11.5–15.5)
WBC Count: 5 10*3/uL (ref 4.0–10.5)
nRBC: 0 % (ref 0.0–0.2)

## 2022-10-09 NOTE — Assessment & Plan Note (Signed)
Widespread bone metastases diagnosed in November 2022, with a history of stage IIB hormone receptor positive breast cancer in 2014.  There were multiple marrow replacing lesions within the proximal right femur within the ischium as well as the inferior rami. 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  Pathology revealed positive estrogen receptors.  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 Ki67 was 60%. PET in December revealed dominant finding of intensely hypermetabolic aggressive skeletal metastasis involving the calvarium, sternum, thoracic and lumbar spine, and pelvis, with potential pathologic fracture of the right femur with internal fixation. There was soft tissue extension into the anterior mediastinum associated with the manubrial metastasis. Multiple spinal vertebral body lesions have cortical destruction along the posterior margin.  She started monthly Xgeva in December.  She received palliative radiation therapy to the right hip, left sacrum/SI joint and upper T-spine/sternum completed in February 2023.   She was treated with docetaxel/trastuzumab/pertuzumab for 4 cycles.  Cycle 4 was delayed and dose reduction made by 20% due to left leg infection.  Docetaxel was then discontinued due to delayed wound healing.  She has continued maintenance trastuzumab/pertuzumab, as well as denosumab.  Palliative hormonal therapy had not been started due to elevation of transaminases.  CT chest, abdomen and pelvis in September revealed increasing sclerosis of her bony metastatic disease felt to be likely treatment response.  There was a single lesion along the left iliac which remained lytic and follow-up was recommended.  There was interval development of pathologic fractures at T3, T4, T12.  No signs of solid organ or nodal metastatic disease was seen there was marked elevation of the left hemidiaphragm.  She was referred for consideration of kyphoplasty with  radiofrequency ablation and this was done at T12 and L1.  As the upper thoracic lesions were not extremely symptomatic, the procedure was not recommended in this area.  She developed left hip pain, so saw her orthopedic surgeon. MRI in December revealed a pathologic fracture along the superior acetabulum with numerous metastatic lesions throughout the pelvis and proximal femur.  Surgery was not recommended. She was placed on hormonal therapy with monthly fulvestrant injections in January 2024 and has tolerated these without difficulty. She received palliative radiation to the left hip completed on February 14 with improvement in her pain.  She has not tolerated multiple formulations of long-acting narcotics, so continues immediate release oxycodone 10 mg every 4 hours as needed for pain.    CT chest, abdomen and pelvis from February 22nd unfortunately reveals progressive disease, with signs of nodal disease in the left thoracic inlet and left retropectoral region, as well as worsening bony metastatic disease with worsening areas of lytic changes throughout the many vertebral bodies and pelvis.  There is diffuse sclerosis in the left proximal femur which potentially could be treatment effect as she has recently received radiation to this area, although breast cancer bone metastasis can be sclerotic.  The CA27-29 remains elevated, but is basically stable.  We recommend alternative treatment with Enhertu.  This is also given on an every 21-day cycle.  We discussed the more common side effects of nausea, vomiting, diarrhea, constipation, hair loss and muscle pain, as well as the potential for allergic reaction, effects on the heart function, and potential for pulmonary effects with cough and shortness of breath.  She knows to seek admitted medical attention if she has severe symptoms.  As we just started her hormonal therapy, we will continue the fulvestrant  monthly.  We will also continue denosumab monthly.  Her  pain remains fairly well-controlled with oxycodone 10 mg as needed.  We will plan to see her back in about 10 days with a CBC and comprehensive metabolic panel to see how she tolerated her first cycle of Enhertu.  She will be due for an echocardiogram prior to her second cycle.

## 2022-10-09 NOTE — Assessment & Plan Note (Signed)
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  She was treated with lumpectomy followed by adjuvant chemotherapy and radiation therapy. She was on adjuvant hormonal therapy with tamoxifen 20 mg daily from October 2015 to May 2020, but due to elevation of the liver transaminases, was switched to anastrazole.  She developed bone metastasis in November 2022.

## 2022-10-10 ENCOUNTER — Other Ambulatory Visit: Payer: Self-pay | Admitting: Pharmacist

## 2022-10-10 ENCOUNTER — Encounter: Payer: Self-pay | Admitting: Oncology

## 2022-10-10 ENCOUNTER — Encounter: Payer: Self-pay | Admitting: Hematology and Oncology

## 2022-10-10 ENCOUNTER — Telehealth: Payer: Self-pay | Admitting: Hematology and Oncology

## 2022-10-10 ENCOUNTER — Other Ambulatory Visit: Payer: Self-pay | Admitting: Oncology

## 2022-10-10 ENCOUNTER — Inpatient Hospital Stay (INDEPENDENT_AMBULATORY_CARE_PROVIDER_SITE_OTHER): Payer: BC Managed Care – PPO | Admitting: Hematology and Oncology

## 2022-10-10 VITALS — BP 124/83 | HR 100 | Temp 98.7°F | Resp 20 | Ht 59.0 in | Wt 135.2 lb

## 2022-10-10 DIAGNOSIS — B3789 Other sites of candidiasis: Secondary | ICD-10-CM | POA: Insufficient documentation

## 2022-10-10 DIAGNOSIS — C50412 Malignant neoplasm of upper-outer quadrant of left female breast: Secondary | ICD-10-CM

## 2022-10-10 DIAGNOSIS — C7951 Secondary malignant neoplasm of bone: Secondary | ICD-10-CM

## 2022-10-10 DIAGNOSIS — Z170 Estrogen receptor positive status [ER+]: Secondary | ICD-10-CM

## 2022-10-10 DIAGNOSIS — T451X5D Adverse effect of antineoplastic and immunosuppressive drugs, subsequent encounter: Secondary | ICD-10-CM

## 2022-10-10 LAB — CANCER ANTIGEN 27.29: CA 27.29: 53.8 U/mL — ABNORMAL HIGH (ref 0.0–38.6)

## 2022-10-10 MED ORDER — LORAZEPAM 1 MG PO TABS: 1.0000 mg | ORAL_TABLET | Freq: Three times a day (TID) | ORAL | 0 refills | Status: DC

## 2022-10-10 NOTE — Progress Notes (Signed)
[facility]  [address] [place],  [place]  [address] [phone]  Clinic Day:  10/10/2022  Referring physician: Bonnita Nasuti, MD  ASSESSMENT & PLAN:   Assessment & Plan: Malignant neoplasm metastatic to bone [facility]) Widespread bone metastases diagnosed in November 2022, with a history of stage IIB hormone receptor positive breast cancer in 2014.  There were multiple marrow replacing lesions within the proximal right femur within the ischium as well as the inferior rami. 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  Pathology revealed positive estrogen receptors.  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 Ki67 was 60%. PET in December revealed dominant finding of intensely hypermetabolic aggressive skeletal metastasis involving the calvarium, sternum, thoracic and lumbar spine, and pelvis, with potential pathologic fracture of the right femur with internal fixation. There was soft tissue extension into the anterior mediastinum associated with the manubrial metastasis. Multiple spinal vertebral body lesions have cortical destruction along the posterior margin.  She started monthly Xgeva in December.  She received palliative radiation therapy to the right hip, left sacrum/SI joint and upper T-spine/sternum completed in February 2023.   She was treated with docetaxel/trastuzumab/pertuzumab for 4 cycles.  Cycle 4 was delayed and dose reduction made by 20% due to left leg infection.  Docetaxel was then discontinued due to delayed wound healing.  She has continued maintenance trastuzumab/pertuzumab, as well as denosumab.  Palliative hormonal therapy had not been started due to elevation of transaminases.  CT chest, abdomen and pelvis in September revealed increasing sclerosis of her bony metastatic disease felt to be likely treatment response.  There was a single lesion along the left iliac which remained lytic and follow-up  was recommended.  There was interval development of pathologic fractures at T3, T4, T12.  No signs of solid organ or nodal metastatic disease was seen there was marked elevation of the left hemidiaphragm.  She was referred for consideration of kyphoplasty with radiofrequency ablation and this was done at T12 and L1.  As the upper thoracic lesions were not extremely symptomatic, the procedure was not recommended in this area.  She developed left hip pain, so saw her orthopedic surgeon. MRI in December revealed a pathologic fracture along the superior acetabulum with numerous metastatic lesions throughout the pelvis and proximal femur.  Surgery was not recommended. She was placed on hormonal therapy with monthly fulvestrant injections in January 2024 and has tolerated these without difficulty. She received palliative radiation to the left hip completed on February 14 with improvement in her pain.  She has not tolerated multiple formulations of long-acting narcotics, so continues immediate release oxycodone 10 mg every 4 hours as needed for pain.    CT chest, abdomen and pelvis from February 22nd unfortunately reveals progressive disease, with signs of nodal disease in the left thoracic inlet and left retropectoral region, as well as worsening bony metastatic disease with worsening areas of lytic changes throughout the many vertebral bodies and pelvis.  There is diffuse sclerosis in the left proximal femur which potentially could be treatment effect as she has recently received radiation to this area, although breast cancer bone metastasis can be sclerotic.  The CA27-29 remains elevated, but is basically stable.  We recommend alternative treatment with Enhertu.  This is also given on an every 21-day cycle.  We discussed the more common side effects of nausea, vomiting, diarrhea, constipation, hair loss and muscle pain, as well as the potential for  allergic reaction, effects on the heart function, and potential for  pulmonary effects with cough and shortness of breath.  She knows to seek admitted medical attention if she has severe symptoms.  As we just started her hormonal therapy, we will continue the fulvestrant monthly.  We will also continue denosumab monthly.  Her pain remains fairly well-controlled with oxycodone 10 mg as needed.  We will plan to see her back in about 10 days with a CBC and comprehensive metabolic panel to see how she tolerated her first cycle of Enhertu.  She will be due for an echocardiogram prior to her second cycle.    Malignant neoplasm of upper-outer quadrant of left female breast ([place]) History of stage IIB hormone receptor positive left breast cancer, diagnosed in December 2014.  She was treated with lumpectomy followed by adjuvant chemotherapy and radiation therapy. She was on adjuvant hormonal therapy with tamoxifen 20 mg daily from October 2015 to May 2020, but due to elevation of the liver transaminases, was switched to anastrazole.  She developed bone metastasis in November 2022.  Candidiasis of breast Mild candidiasis in the bilateral inframammary areas.  I will prescribe 10 powder twice daily.   The patient and her husband understands the plan discussed today and are in agreement with them.  They knows to contact our office if she develops concerns prior to her next appointment.   I provided 40 minutes of face-to-face time during this encounter and > 50% was spent counseling as documented under my assessment and plan.    [name], PA-C  [facility] [address] [place] [address] Dept: [phone] Dept Fax: [phone]   Orders Placed This Encounter  Procedures   ECHOCARDIOGRAM COMPLETE    Standing Status:   Future    Standing Expiration Date:   10/11/2023    Order Specific Question:   Where should this test be performed    Answer:   [facility]     Order Specific Question:    Perflutren DEFINITY (image enhancing agent) should be administered unless hypersensitivity or allergy exist    Answer:   Administer Perflutren    Order Specific Question:   Is a special reader required? (athlete or structural heart)    Answer:   No    Order Specific Question:   Reason for exam-Echo    Answer:   Chemo  [id number]      CHIEF COMPLAINT:  CC: Metastatic breast cancer to bone  Current Treatment: Trastuzumab/pertuzumab/fulvestrant/denosumab  HISTORY OF PRESENT ILLNESS:  Anna [name] is a 52 y.o. female originally diagnosed with stage IIB (T2 N1a M0) hormone receptor positive left breast cancer in December 2014.  She was treated with lumpectomy.  Pathology revealed a 2.1 cm, grade 3, invasive ductal carcinoma with 1 lymph node positive for metastasis.  Lymphovascular invasion was seen.  Estrogen and progesterone receptors were positive and her 2 Neu negative.  Ki 67 was 76%.  She received adjuvant chemotherapy with 4 cycles of doxorubicin and cyclophosphamide, followed by 12 weeks of weekly paclitaxel.  She then received adjuvant radiation to the left breast.  She was placed on tamoxifen 20 mg daily in October 2015.  She underwent testing for hereditary cancer syndromes with [employer] myRisk Hereditary Cancer panel, which did not reveal any clinically significant mutation or variants of uncertain significance, so no change in management was recommended.  She was found to have fatty infiltration of the liver on  a CTA chest in June 2015.  Bone density scan in November 2017 was normal.  Bilateral diagnostic mammogram in November 2018 did not reveal any evidence of malignancy.   When she was seen in May 2019, she had mild elevation of the AST, which was felt to likely be due to medication effects. She had a bilateral diagnostic mammogram in January 2020.  At that time, she reported a lump in the left breast, so an ultrasound was also done.  Bilateral mammogram and left breast ultrasound did not  reveal any suspicious findings.  Postsurgical scarring was seen in the left breast.  The area of palpable abnormality in the left breast was overlying the postsurgical scarring.  She was rescheduled in our office in May 2020 and found to have significant increase in her liver transaminases.  Tamoxifen was discontinued.  She was instructed to avoid alcohol and Tylenol.  CT abdomen and pelvis revealed severe hepatic steatosis.  Bone density scan in June 2020 revealed mild osteopenia with a T-score -1.4 in the spine.  Her last menstruation was in January 2015 and labs confirmed her postmenopausal status.  She was placed on anastrozole 1 mg daily in June 2020.  She was also referred to hepatology and saw Roosevelt Locks, NP, who diagnosed the patient with nonalcoholic fatty liver disease.  She was unsure if this was secondary to tamoxifen, but felt it was unlikely.  Liver biopsy in July 2020 confirmed severe steatosis of 75-80% with moderate ballooning degeneration.  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  The patient has had improvement in the liver transaminases. Abdominal ultrasound per Dr. Beverley Fiedler which revealed an enlarging exophytic cyst of the right kidney.  This has been appreciated for many years, on PET imaging from 2014 this measured 10.6 cm, CT in May 2020 this was 12.4 cm, and recent abdominal ultrasound from July measured this at 14.5 cm. MRI done in November confirmed a benign cyst of the right kidney.  She continues to see Dr. Beverley Fiedler every 6 months.  She has also been seen by dermatology at [facility], with a good report.    She began to have right hip pain and functional disability with rapid worsening. MRI right hip in November 2022 revealed multiple marrow replacing lesions within the proximal femur within the ischium as well as the inferior rami. These findings were most consistent with metastatic disease and impending fracture. The primary and  largest focus nearly fills the entire medullary space at the femoral neck with endosteal thinning and irregularity, most severely over the anterior cortex. She underwent right hip arthroplasty in November with Dr. Paralee Cancel. Surgical pathology confirmed metastatic carcinoma, consistent with patient's clinical history of primary breast carcinoma.   Prognostic profile confirmed estrogen receptor was positive at 95%, and progesterone receptor was positive at 40%. HER2 was positive 3+, which was negative on her original cancer. PET in December 2022 revealed dominant finding of intensely hypermetabolic aggressive skeletal metastasis involving the calvarium, sternum, thoracic and lumbar spine, and pelvis. There is potential pathologic fracture of the right femur with internal fixation, and soft tissue extension into the anterior mediastinum associated with the manubrial metastasis. Multiple spinal vertebral body lesions have cortical destruction along the posterior margin. Baseline ECHO revealed normal left ventricular size and function with an ejection fraction of 55-60%. Bone density from December revealed mild osteopenia of the AP spine with a T-score of -1.2.  She was initially treated  with docetaxel/trastuzumab/pertuzumab 4 cycles.  Due to left leg staphylococcal infection, cycle 4 was delayed and docetaxel dose was reduced by 20%.  Docetaxel was then discontinued due to difficulty with wound healing.  She has continued trastuzumab/pertuzumab/denosumab.  Most recent echocardiogram in December was stable with an ejection fraction of 55 to 60%.  She had elevation of the transaminases during chemotherapy, so we did not restart therapy.  The transaminases normalized in December.  She was started on monthly fulvestrant injections in January.   Oncology History  Malignant neoplasm of upper-outer quadrant of left female breast ([place])  07/23/2013 Initial Diagnosis   Malignant neoplasm of upper-outer quadrant of  left female breast ([place])   07/23/2013 Cancer Staging   Staging form: Breast, AJCC 7th Edition - Clinical stage from 07/23/2013: Stage IIB (T2, N1, M0) - Signed by Derwood Kaplan, MD on 07/04/2020 Prognostic indicators: Pos LVI   09/22/2021 - 04/06/2022 Chemotherapy   Patient is on Treatment Plan : BREAST  Trastuzumab + Pertuzumab q21d      09/22/2021 - 09/21/2022 Chemotherapy   Patient is on Treatment Plan : BREAST Trastuzumab + Pertuzumab q21d     Malignant neoplasm metastatic to bone ([place])  06/28/2021 Initial Diagnosis   Bone metastases ([place])   09/22/2021 - 04/06/2022 Chemotherapy   Patient is on Treatment Plan : BREAST  Trastuzumab + Pertuzumab q21d      09/22/2021 - 09/21/2022 Chemotherapy   Patient is on Treatment Plan : BREAST Trastuzumab + Pertuzumab q21d     05/11/2022 Imaging   CT chest, abdomen and pelvis:  IMPRESSION:  1. Widespread bony metastatic disease shows increased sclerosis  since previous imaging. This is likely related to response to  therapy in the interval. There is a single lesion along the LEFT  iliac which shows continued lytic change and warrants attention on  follow-up  2. Interval development of pathologic fractures at in the upper  thoracic spine and at the thoracolumbar junction with associated  kyphosis.  3. No signs of solid organ or nodal metastatic disease.  4. Marked elevation of the LEFT hemidiaphragm as on the prior PET.  5. Aortic atherosclerosis.        INTERVAL HISTORY:  Anna [name] is here today for repeat clinical assessment.  She started fulvestrant in January and has tolerated these injections without difficulty.  She continues to tolerate trastuzumab/pertuzumab without significant difficulty.  She has occasional nausea without vomiting for which medication is effective.  She denies fevers or chills. She states her back pain is fairly well-controlled with oxycodone 10 Mg every 4 hours as as needed. Her appetite is good. Her weight has  been stable.  She is taking calcium Gummies, but usually just 2 daily.  She requests refill her lorazepam.  REVIEW OF SYSTEMS:  Review of Systems  Constitutional:  Positive for fatigue. Negative for appetite change, chills, fever and unexpected weight change.  HENT:   Negative for lump/mass, mouth sores and sore throat.   Respiratory:  Negative for cough and shortness of breath.   Cardiovascular:  Negative for chest pain and leg swelling.  Gastrointestinal:  Negative for abdominal pain, constipation, diarrhea, nausea and vomiting.  Endocrine: Negative for hot flashes.  Genitourinary:  Negative for difficulty urinating, dysuria, frequency and hematuria.   Musculoskeletal:  Positive for back pain. Negative for arthralgias.  Skin:  Negative for rash.  Neurological:  Negative for dizziness and headaches.  Hematological:  Negative for adenopathy. Does not bruise/bleed easily.  Psychiatric/Behavioral:  Negative for depression and  sleep disturbance. The patient is not nervous/anxious.      VITALS:  Blood pressure 124/83, pulse 100, temperature 98.7 F (37.1 C), temperature source Oral, resp. rate 20, height '4\' 11"'$  (1.499 m), weight 135 lb 3.2 oz (61.3 kg), last menstrual period 08/18/2013, SpO2 96 %.  Wt Readings from Last 3 Encounters:  10/10/22 135 lb 3.2 oz (61.3 kg)  09/21/22 135 lb 0.6 oz (61.3 kg)  09/19/22 133 lb 8 oz (60.6 kg)    Body mass index is 27.31 kg/m.  Performance status (ECOG): 2 - Symptomatic, <50% confined to bed  PHYSICAL EXAM:  Physical Exam Vitals and nursing note reviewed.  Constitutional:      General: She is not in acute distress.    Appearance: Normal appearance.  HENT:     Head: Normocephalic and atraumatic.     Mouth/Throat:     Mouth: Mucous membranes are moist.     Pharynx: Oropharynx is clear. No oropharyngeal exudate or posterior oropharyngeal erythema.  Eyes:     General: No scleral icterus.    Extraocular Movements: Extraocular movements intact.      Conjunctiva/sclera: Conjunctivae normal.     Pupils: Pupils are equal, round, and reactive to light.  Cardiovascular:     Rate and Rhythm: Normal rate and regular rhythm.     Heart sounds: Normal heart sounds. No murmur heard.    No friction rub. No gallop.  Pulmonary:     Effort: Pulmonary effort is normal.     Breath sounds: Normal breath sounds. No wheezing, rhonchi or rales.  Chest:     Chest wall: Swelling (Fullness and tenderness of the left anterior chest wall above the breast without discrete mass) and tenderness present.  Breasts:    Right: Normal. No inverted nipple, mass, nipple discharge or skin change.     Left: Mass (Diffuse firmness of the left breast) and skin change (Diffuse thickening of the skin of the left breast) present. No inverted nipple, nipple discharge or tenderness.     Comments: There is mild candidiasis in the bilateral inframammary areas. Abdominal:     General: There is no distension.     Palpations: Abdomen is soft. There is no hepatomegaly, splenomegaly or mass.     Tenderness: There is no abdominal tenderness.  Musculoskeletal:        General: Normal range of motion.     Cervical back: Normal range of motion and neck supple. No tenderness.     Right lower leg: No edema.     Left lower leg: No edema.  Lymphadenopathy:     Cervical: No cervical adenopathy.     Upper Body:     Right upper body: No supraclavicular or axillary adenopathy.     Left upper body: No supraclavicular or axillary adenopathy.     Lower Body: No right inguinal adenopathy. No left inguinal adenopathy.  Skin:    General: Skin is warm and dry.     Coloration: Skin is not jaundiced.     Findings: Erythema and rash present. Rash is scaling.     Comments: There is general skin dryness.  She has persistent scaling and erythema of the left lower extremity.  Neurological:     Mental Status: She is alert and oriented to person, place, and time.     Cranial Nerves: No cranial nerve  deficit.  Psychiatric:        Mood and Affect: Mood normal.        Behavior: Behavior normal.  Thought Content: Thought content normal.     LABS:      Latest Ref Rng & Units 10/09/2022    9:19 AM 09/18/2022   10:44 AM 08/29/2022    1:31 PM  CBC  WBC 4.0 - 10.5 K/uL 5.0  5.4  6.2   Hemoglobin 12.0 - 15.0 g/dL 12.1  11.0  11.2   Hematocrit 36.0 - 46.0 % 41.2  37.6  39.2   Platelets 150 - 400 K/uL 212  203  527       Latest Ref Rng & Units 10/09/2022    9:19 AM 09/18/2022   10:44 AM 08/29/2022    1:32 PM  CMP  Glucose 70 - 99 mg/dL 106  90  78   BUN 6 - 20 mg/dL '16  15  10   '$ Creatinine 0.44 - 1.00 mg/dL 0.66  0.59  0.74   Sodium 135 - 145 mmol/L 139  140  142   Potassium 3.5 - 5.1 mmol/L 4.2  3.5  3.4   Chloride 98 - 111 mmol/L 104  104  104   CO2 22 - 32 mmol/L '29  28  30   '$ Calcium 8.9 - 10.3 mg/dL 8.8  8.4  8.7   Total Protein 6.5 - 8.1 g/dL 6.9  6.9  7.5   Total Bilirubin 0.3 - 1.2 mg/dL 0.4  0.4  0.3   Alkaline Phos 38 - 126 U/L 89  117  261   AST 15 - 41 U/L '23  17  23   '$ ALT 0 - 44 U/L '22  15  24      '$ Lab Results  Component Value Date   CEA1 2.2 07/20/2021   /  CEA  Date Value Ref Range Status  07/20/2021 2.2 0.0 - 4.7 ng/mL Final    Comment:    (NOTE)                             Nonsmokers          <3.9                             Smokers             <5.6 [employer] Electrochemiluminescence Immunoassay (ECLIA) Values obtained with different assay methods or kits cannot be used interchangeably.  Results cannot be interpreted as absolute evidence of the presence or absence of malignant disease. Performed At: [facility] [place], [place] [id number] Rush Farmer MD [id number]    No results found for: "PSA1" No results found for: "CAN199" No results found for: "CAN125"  No results found for: "TOTALPROTELP", "ALBUMINELP", "A1GS", "A2GS", "BETS", "BETA2SER", "GAMS", "MSPIKE", "SPEI" Lab Results  Component Value Date    TIBC 287 04/24/2022   TIBC 279 10/13/2021   FERRITIN 119 04/24/2022   FERRITIN 206 10/13/2021   IRONPCTSAT 9 (L) 04/24/2022   IRONPCTSAT 13 10/13/2021   No results found for: "LDH"  STUDIES:     Exam(s): [id number] CT/CT CHEST-ABD-PELV W/IV CM  CLINICAL DATA: History of LEFT breast cancer, follow-up evaluation.  * Tracking Code: BO *  EXAM:  CT CHEST, ABDOMEN, AND PELVIS WITH CONTRAST   TECHNIQUE:  Multidetector CT imaging of the chest, abdomen and pelvis was  performed following the standard protocol during bolus  administration of intravenous contrast.   RADIATION DOSE REDUCTION: This exam was performed according to the  departmental dose-optimization program which includes automated  exposure control, adjustment of the mA and/or kV according to  patient size and/or use of iterative reconstruction technique.   CONTRAST: 100 cc Isovue 370   COMPARISON: CT of the chest, abdomen and pelvis performed on  May 10, 2022.   FINDINGS:  CT CHEST FINDINGS  Cardiovascular: RIGHT-sided Port-A-Cath terminates at the caval to  atrial junction. Normal heart size without pericardial effusion.  Normal caliber of central pulmonary vessels. LEFT hilum distorted by  elevated LEFT hemidiaphragm.  Mediastinum/Nodes:  Soft tissue around vascular structures at the LEFT thoracic inlet.  LEFT supraclavicular lymph node at 10 mm similar short axis  dimension perhaps even slightly smaller than on previous imaging.  Soft tissue about LEFT axillary vasculature on image 15/2 measuring  12-13 mm contiguous with ill-defined soft tissue tracking along  vessels in this location suspicious for additional site of  metastatic nodal disease. Somewhat masked by surrounding ill-defined  soft tissue density in this location. No RIGHT thoracic inlet  abnormalities. No internal mammary lymphadenopathy. No thoracic  inlet adenopathy elsewhere or mediastinal or hilar lymphadenopathy.  Lungs/Pleura:  Juxta diaphragmatic atelectasis on the LEFT. Similar  to prior study. No pleural effusion. Large airways are patent.  Musculoskeletal: Stranding over the LEFT breast. Small nodule in  LEFT breast measuring 12 mm previously 13 mm.   CT ABDOMEN PELVIS FINDINGS  Hepatobiliary: No focal, suspicious hepatic lesion, pericholecystic  stranding or biliary duct dilation. Portal vein is patent.  Pancreas: Normal, without mass, inflammation or ductal dilatation.  Spleen: Normal.  Adrenals/Urinary Tract: Large benign-appearing Bosniak category II  renal cyst for which no additional dedicated follow-up imaging is  recommended noted arising from the inferior pole of the RIGHT  kidney. No hydronephrosis. No perinephric stranding. Urinary bladder  obscured by streak artifact from LEFT hip arthroplasty. LEFT kidney  is unremarkable. No adrenal abnormalities are seen.  Stomach/Bowel: Stomach distended mildly beneath the markedly  elevated LEFT hemidiaphragm. No signs of bowel obstruction. No signs  of acute bowel process. Appendix not visible but no secondary signs  to suggest acute appendiceal process.  Vascular/Lymphatic:  Aortic atherosclerosis. No sign of aneurysm. Smooth contour of the  IVC. There is no gastrohepatic or hepatoduodenal ligament  lymphadenopathy. No retroperitoneal or mesenteric lymphadenopathy.  No pelvic sidewall lymphadenopathy.  Reproductive: Unremarkable by CT, not well assessed due to streak  artifact in the pelvis.  Other: No ascites.  Musculoskeletal: Post cement augmentation at T12-L1. Some increase  in areas of lytic change since previous imaging but still with  extensive bony metastases.  T3-T4 compression fractures with similar appearance. Increasing and  new lytic change at T7. Increasing lytic change at T8 along the  anterior vertebral body compared to the prior imaging study.  Similar appearance of inferior endplate fracture at T9 and  sclerosis. Similar appearance  of kyphotic deformity associated with  compression fractures. Increasingly sclerotic and lytic process  involving posterior vertebral body at L2 compared to previous  imaging. L3 also with more visible areas of sclerosis. Lytic and  sclerotic changes throughout the remainder of the spine with similar appearance.  Areas in the pelvis also with increasingly lytic changes, for  instance along the RIGHT iliac crest on image 61/602 with further  lytic process destroying inter cortex and signs of previous  sclerosis.  Diffuse sternal involvement as on previous imaging.  Diffuse sclerosis of the LEFT femoral neck and inter trochanteric  LEFT femur.   IMPRESSION:  1. Signs of nodal  disease in the LEFT thoracic inlet and LEFT  retropectoral region axilla. Ill-defined soft tissue about this area  may reflect infiltrative disease or post treatment changes best  illustrated on image [id number] tracking along vascular structures in  the LEFT thoracic inlet.  2. Small nodule in LEFT breast measuring 12 mm previously 13 mm.  3. Worsening of bony metastatic disease post treatment of T12-L1  compression fractures and with worsening of areas of lytic change  throughout many of the vertebral bodies in the pelvis.  4. Note that the LEFT proximal femur shows diffuse sclerosis of the  femoral neck and intertrochanteric femur.  5. No signs of solid organ metastatic disease in the abdomen or  pelvis.  6. Aortic atherosclerosis.  7. Markedly elevated LEFT hemidiaphragm. 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    HISTORY:   Past Medical History:  Diagnosis Date   Achilles tendinitis of left lower extremity 04/22/2018   Acute peptic ulcer without hemorrhage and without perforation 11/29/2020   Acute sinusitis 11/29/2020   Bipolar disorder ([place]) 11/29/2020   Breast cancer ([place])    Cancer ([place])     Cardiac murmur    Chest pain    Chronic fatigue syndrome 11/29/2020   Depression    Essential hypertension 11/29/2020   Hepatic steatosis determined by biopsy of liver 07/04/2020   HLD (hyperlipidemia)    Hypertension    Hypothyroidism 11/29/2020   Insomnia 11/29/2020   Iron deficiency anemia 06/05/2022   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   Migraine 11/29/2020   Mild intermittent asthma 11/29/2020   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   Mixed hyperlipidemia 11/29/2020   Obesity due to excess calories    Osteopenia after menopause 07/04/2020   Other long term (current) drug therapy 11/29/2020   Other vitamin B12 deficiency anemias 11/29/2020   Personal history of malignant neoplasm of breast 11/29/2020   Pre-diabetes    Renal cyst, acquired, right 07/04/2020   14.5 cm in October 2021   Retrocalcaneal bursitis (back of heel), left 04/22/2018   Seasonal allergic rhinitis 11/29/2020   Tightness of heel cord, left 04/22/2018   Type 2 diabetes mellitus without complications ([place]) [id number]   Vitamin D deficiency 11/29/2020    Past Surgical History:  Procedure Laterality Date   CESAREAN SECTION     IR BONE TUMOR(S)RF ABLATION  05/29/2022   IR BONE TUMOR(S)RF ABLATION  05/29/2022   IR KYPHO EA ADDL LEVEL THORACIC OR LUMBAR  05/29/2022   IR KYPHO THORACIC WITH BONE BIOPSY  05/29/2022   IR RADIOLOGIST EVAL & MGMT  05/22/2022   IR RADIOLOGIST EVAL & MGMT  06/12/2022   LIVER BIOPSY  01/2019   lumpectomy  2014   Left Breast   PORT-A-CATH REMOVAL     PORTACATH PLACEMENT  08/28/2013   STOMACH SURGERY     Tummy Tuck   TOTAL HIP ARTHROPLASTY Right 07/04/2021   Procedure: TOTAL HIP ARTHROPLASTY;  Surgeon: Paralee Cancel, MD;  Location: [facility];  Service: Orthopedics;  Laterality: Right;  33   WISDOM TOOTH EXTRACTION      Family History  Problem Relation Age of Onset   Hyperlipidemia Mother    Diabetes Father    Stroke Father    Colon  cancer Neg Hx    Colon polyps Neg Hx    Esophageal cancer Neg Hx  Rectal cancer Neg Hx    Stomach cancer Neg Hx     Social History:  reports that she quit smoking about 20 years ago. Her smoking use included cigarettes. She has never used smokeless tobacco. She reports current alcohol use. She reports that she does not use drugs.The patient is accompanied by her husband today.  Allergies: No Known Allergies  Current Medications: Current Outpatient Medications  Medication Sig Dispense Refill   LORazepam (ATIVAN) 1 MG tablet Take 1 mg by mouth every 8 (eight) hours as needed for anxiety (nausea or vomiting).     nystatin (MYCOSTATIN/NYSTOP) powder Apply 1 Application topically 2 (two) times daily.     albuterol (VENTOLIN HFA) 108 (90 Base) MCG/ACT inhaler Inhale 2 puffs into the lungs every 6 (six) hours as needed for shortness of breath.     ARIPiprazole (ABILIFY) 5 MG tablet Take 2.5 mg by mouth daily.     aspirin 81 MG EC tablet Take 81 mg by mouth daily. Swallow whole.     atorvastatin (LIPITOR) 20 MG tablet TAKE ONE TABLET BY MOUTH EVERY DAY 90 tablet 0   augmented betamethasone dipropionate (DIPROLENE-AF) 0.05 % cream      calcium carbonate (OS-CAL) 1250 (500 Ca) MG chewable tablet Chew 6 tablets by mouth daily.     CLENPIQ 10-3.5-12 MG-GM -GM/160ML SOLN      desvenlafaxine (PRISTIQ) 50 MG 24 hr tablet Take 50 mg by mouth daily.     diphenoxylate-atropine (LOMOTIL) 2.5-0.025 MG tablet TAKE TWO TABLETS BY MOUTH FOUR TIMES A DAY AS NEEDED FOR DIARRHEA OR LOOSE STOOLS 100 tablet 1   FARXIGA 10 MG TABS tablet Take 1 tablet (10 mg total) by mouth daily. 30 tablet 11   furosemide (LASIX) 20 MG tablet Take 1 tablet (20 mg total) by mouth daily. (Patient taking differently: Take 20 mg by mouth as needed.) 30 tablet 0   labetalol (NORMODYNE) 200 MG tablet Take 1 tablet (200 mg total) by mouth 2 (two) times daily. 180 tablet 3   Loratadine 10 MG CAPS Take 1 capsule (10 mg total) by mouth  daily. 30 capsule 6   LORazepam (ATIVAN) 1 MG tablet Take 1 tablet (1 mg total) by mouth every 8 (eight) hours. 30 tablet 0   magic mouthwash (lidocaine, diphenhydrAMINE, alum & mag hydroxide) suspension Take 5 mLs by mouth every 3 (three) hours as needed. Swish and spit; for throat/mouth pain     naloxone (NARCAN) nasal spray 4 mg/0.1 mL One spray in nostril as needed, may repeat every 2 to 3 minutes until medical assistance becomes available 4 each 1   nitroGLYCERIN (NITROSTAT) 0.4 MG SL tablet Place 0.4 mg under the tongue every 5 (five) minutes as needed for chest pain.     omeprazole (PRILOSEC) 40 MG capsule      ondansetron (ZOFRAN) 4 MG tablet Take 1 tablet (4 mg total) by mouth every 4 (four) hours as needed for nausea. 90 tablet 3   Oxycodone HCl 10 MG TABS Take 1 tablet (10 mg total) by mouth every 4 (four) hours as needed. 180 tablet 0   OZEMPIC, 0.25 OR 0.5 MG/DOSE, 2 MG/3ML SOPN Inject into the skin.     potassium chloride SA (KLOR-CON M) 20 MEQ tablet Take 1 tablet (20 mEq total) by mouth 2 (two) times daily. 60 tablet 0   prochlorperazine (COMPAZINE) 10 MG tablet Take 1 tablet (10 mg total) by mouth every 6 (six) hours as needed for nausea or vomiting. 90 tablet 3  valsartan (DIOVAN) 40 MG tablet TAKE ONE TABLET BY MOUTH EVERY DAY 90 tablet 0   Current Facility-Administered Medications  Medication Dose Route Frequency Provider Last Rate Last Admin   0.9 %  sodium chloride infusion  500 mL Intravenous Once Jackquline Denmark, MD       technetium sestamibi generic (CARDIOLITE) injection [id number] millicurie  [id number] millicurie Intravenous Once PRN Revankar, Reita Cliche, MD

## 2022-10-10 NOTE — Telephone Encounter (Signed)
Patient has been scheduled for follow-up visit per 10/10/22 LOS.  Pt given an appt calendar with date and time.

## 2022-10-10 NOTE — Assessment & Plan Note (Signed)
Mild candidiasis in the bilateral inframammary areas.  I will prescribe 10 powder twice daily.

## 2022-10-11 ENCOUNTER — Other Ambulatory Visit: Payer: Self-pay

## 2022-10-11 MED FILL — Fosaprepitant Dimeglumine For IV Infusion 150 MG (Base Eq): INTRAVENOUS | Qty: 5 | Status: AC

## 2022-10-11 MED FILL — Dexamethasone Sodium Phosphate Inj 100 MG/10ML: INTRAMUSCULAR | Qty: 1 | Status: AC

## 2022-10-12 ENCOUNTER — Inpatient Hospital Stay: Payer: BC Managed Care – PPO | Attending: Hematology and Oncology

## 2022-10-12 ENCOUNTER — Telehealth: Payer: Self-pay | Admitting: Cardiology

## 2022-10-12 ENCOUNTER — Ambulatory Visit: Payer: BC Managed Care – PPO

## 2022-10-12 VITALS — BP 117/78 | HR 99 | Temp 98.8°F | Resp 18 | Ht 59.0 in | Wt 136.0 lb

## 2022-10-12 DIAGNOSIS — Z170 Estrogen receptor positive status [ER+]: Secondary | ICD-10-CM | POA: Diagnosis not present

## 2022-10-12 DIAGNOSIS — C50412 Malignant neoplasm of upper-outer quadrant of left female breast: Secondary | ICD-10-CM | POA: Diagnosis not present

## 2022-10-12 DIAGNOSIS — Z5112 Encounter for antineoplastic immunotherapy: Secondary | ICD-10-CM | POA: Insufficient documentation

## 2022-10-12 DIAGNOSIS — Z79811 Long term (current) use of aromatase inhibitors: Secondary | ICD-10-CM | POA: Insufficient documentation

## 2022-10-12 DIAGNOSIS — Z5111 Encounter for antineoplastic chemotherapy: Secondary | ICD-10-CM | POA: Insufficient documentation

## 2022-10-12 DIAGNOSIS — C7951 Secondary malignant neoplasm of bone: Secondary | ICD-10-CM | POA: Diagnosis not present

## 2022-10-12 MED ORDER — PALONOSETRON HCL INJECTION 0.25 MG/5ML
0.2500 mg | Freq: Once | INTRAVENOUS | Status: AC
  Administered 2022-10-12: 0.25 mg via INTRAVENOUS
  Filled 2022-10-12: qty 5

## 2022-10-12 MED ORDER — DEXTROSE 5 % IV SOLN: Freq: Once | INTRAVENOUS | Status: AC

## 2022-10-12 MED ORDER — HEPARIN SOD (PORK) LOCK FLUSH 100 UNIT/ML IV SOLN
500.0000 [IU] | Freq: Once | INTRAVENOUS | Status: AC | PRN
  Administered 2022-10-12: 500 [IU]

## 2022-10-12 MED ORDER — ACETAMINOPHEN 325 MG PO TABS
650.0000 mg | ORAL_TABLET | Freq: Once | ORAL | Status: DC
  Filled 2022-10-12: qty 2

## 2022-10-12 MED ORDER — DENOSUMAB 120 MG/1.7ML ~~LOC~~ SOLN
120.0000 mg | Freq: Once | SUBCUTANEOUS | Status: AC
  Administered 2022-10-12: 120 mg via SUBCUTANEOUS
  Filled 2022-10-12: qty 1.7

## 2022-10-12 MED ORDER — SODIUM CHLORIDE 0.9% FLUSH
10.0000 mL | INTRAVENOUS | Status: DC | PRN
  Administered 2022-10-12: 10 mL

## 2022-10-12 MED ORDER — DIPHENHYDRAMINE HCL 25 MG PO CAPS
50.0000 mg | ORAL_CAPSULE | Freq: Once | ORAL | Status: AC
  Administered 2022-10-12: 25 mg via ORAL
  Filled 2022-10-12: qty 2

## 2022-10-12 MED ORDER — FULVESTRANT 250 MG/5ML IM SOSY
500.0000 mg | PREFILLED_SYRINGE | Freq: Once | INTRAMUSCULAR | Status: AC
  Administered 2022-10-12: 500 mg via INTRAMUSCULAR

## 2022-10-12 MED ORDER — VALSARTAN 40 MG PO TABS: 40.0000 mg | ORAL_TABLET | Freq: Every day | ORAL | 0 refills | Status: DC

## 2022-10-12 MED ORDER — SODIUM CHLORIDE 0.9 % IV SOLN
150.0000 mg | Freq: Once | INTRAVENOUS | Status: AC
  Administered 2022-10-12: 150 mg via INTRAVENOUS
  Filled 2022-10-12: qty 150

## 2022-10-12 MED ORDER — SODIUM CHLORIDE 0.9 % IV SOLN
10.0000 mg | Freq: Once | INTRAVENOUS | Status: AC
  Administered 2022-10-12: 10 mg via INTRAVENOUS
  Filled 2022-10-12: qty 10

## 2022-10-12 MED ORDER — FAM-TRASTUZUMAB DERUXTECAN-NXKI CHEMO 100 MG IV SOLR
5.4000 mg/kg | Freq: Once | INTRAVENOUS | Status: AC
  Administered 2022-10-12: 340 mg via INTRAVENOUS
  Filled 2022-10-12: qty 17

## 2022-10-12 NOTE — Patient Instructions (Signed)
Denosumab Injection (Oncology) What is this medication? DENOSUMAB (den oh SUE mab) prevents weakened bones caused by cancer. It may also be used to treat noncancerous bone tumors that cannot be removed by surgery. It can also be used to treat high calcium levels in the blood caused by cancer. It works by blocking a protein that causes bones to break down quickly. This slows down the release of calcium from bones, which lowers calcium levels in your blood. It also makes your bones stronger and less likely to break (fracture). This medicine may be used for other purposes; ask your health care provider or pharmacist if you have questions. COMMON BRAND NAME(S): XGEVA What should I tell my care team before I take this medication? They need to know if you have any of these conditions: Dental disease Having surgery or tooth extraction Infection Kidney disease Low levels of calcium or vitamin D in the blood Malnutrition On hemodialysis Skin conditions or sensitivity Thyroid or parathyroid disease An unusual reaction to denosumab, other medications, foods, dyes, or preservatives Pregnant or trying to get pregnant Breast-feeding How should I use this medication? This medication is for injection under the skin. It is given by your care team in a hospital or clinic setting. A special MedGuide will be given to you before each treatment. Be sure to read this information carefully each time. Talk to your care team about the use of this medication in children. While it may be prescribed for children as young as 13 years for selected conditions, precautions do apply. Overdosage: If you think you have taken too much of this medicine contact a poison control center or emergency room at once. NOTE: This medicine is only for you. Do not share this medicine with others. What if I miss a dose? Keep appointments for follow-up doses. It is important not to miss your dose. Call your care team if you are unable to  keep an appointment. What may interact with this medication? Do not take this medication with any of the following: Other medications containing denosumab This medication may also interact with the following: Medications that lower your chance of fighting infection Steroid medications, such as prednisone or cortisone This list may not describe all possible interactions. Give your health care provider a list of all the medicines, herbs, non-prescription drugs, or dietary supplements you use. Also tell them if you smoke, drink alcohol, or use illegal drugs. Some items may interact with your medicine. What should I watch for while using this medication? Your condition will be monitored carefully while you are receiving this medication. You may need blood work while taking this medication. This medication may increase your risk of getting an infection. Call your care team for advice if you get a fever, chills, sore throat, or other symptoms of a cold or flu. Do not treat yourself. Try to avoid being around people who are sick. You should make sure you get enough calcium and vitamin D while you are taking this medication, unless your care team tells you not to. Discuss the foods you eat and the vitamins you take with your care team. Some people who take this medication have severe bone, joint, or muscle pain. This medication may also increase your risk for jaw problems or a broken thigh bone. Tell your care team right away if you have severe pain in your jaw, bones, joints, or muscles. Tell your care team if you have any pain that does not go away or that gets worse. Talk   to your care team if you may be pregnant. Serious birth defects can occur if you take this medication during pregnancy and for 5 months after the last dose. You will need a negative pregnancy test before starting this medication. Contraception is recommended while taking this medication and for 5 months after the last dose. Your care team  can help you find the option that works for you. What side effects may I notice from receiving this medication? Side effects that you should report to your care team as soon as possible: Allergic reactions--skin rash, itching, hives, swelling of the face, lips, tongue, or throat Bone, joint, or muscle pain Low calcium level--muscle pain or cramps, confusion, tingling, or numbness in the hands or feet Osteonecrosis of the jaw--pain, swelling, or redness in the mouth, numbness of the jaw, poor healing after dental work, unusual discharge from the mouth, visible bones in the mouth Side effects that usually do not require medical attention (report to your care team if they continue or are bothersome): Cough Diarrhea Fatigue Headache Nausea This list may not describe all possible side effects. Call your doctor for medical advice about side effects. You may report side effects to FDA at 1-800-FDA-1088. Where should I keep my medication? This medication is given in a hospital or clinic. It will not be stored at home. NOTE: This sheet is a summary. It may not cover all possible information. If you have questions about this medicine, talk to your doctor, pharmacist, or health care provider.  2023 Elsevier/Gold Standard (2021-12-18 00:00:00) Fulvestrant Injection What is this medication? FULVESTRANT (ful VES trant) treats breast cancer. It works by blocking the hormone estrogen in breast tissue, which prevents breast cancer cells from spreading or growing. This medicine may be used for other purposes; ask your health care provider or pharmacist if you have questions. COMMON BRAND NAME(S): FASLODEX What should I tell my care team before I take this medication? They need to know if you have any of these conditions: Bleeding disorder Liver disease Low blood cell levels, such as low white cells, red cells, and platelets An unusual or allergic reaction to fulvestrant, other medications, foods, dyes, or  preservatives Pregnant or trying to get pregnant Breast-feeding How should I use this medication? This medication is injected into a muscle. It is given by your care team in a hospital or clinic setting. Talk to your care team about the use of this medication in children. Special care may be needed. Overdosage: If you think you have taken too much of this medicine contact a poison control center or emergency room at once. NOTE: This medicine is only for you. Do not share this medicine with others. What if I miss a dose? Keep appointments for follow-up doses. It is important not to miss your dose. Call your care team if you are unable to keep an appointment. What may interact with this medication? Certain medications that prevent or treat blood clots, such as warfarin, enoxaparin, dalteparin, apixaban, dabigatran, rivaroxaban This list may not describe all possible interactions. Give your health care provider a list of all the medicines, herbs, non-prescription drugs, or dietary supplements you use. Also tell them if you smoke, drink alcohol, or use illegal drugs. Some items may interact with your medicine. What should I watch for while using this medication? Your condition will be monitored carefully while you are receiving this medication. You may need blood work while taking this medication. Talk to your care team if you may be pregnant. Serious  birth defects can occur if you take this medication during pregnancy and for 1 year after the last dose. You will need a negative pregnancy test before starting this medication. Contraception is recommended while taking this medication and for 1 year after the last dose. Your care team can help you find the option that works for you. Do not breastfeed while taking this medication and for 1 year after the last dose. This medication may cause infertility. Talk to your care team if you are concerned about your fertility. What side effects may I notice from  receiving this medication? Side effects that you should report to your care team as soon as possible: Allergic reactions or angioedema--skin rash, itching or hives, swelling of the face, eyes, lips, tongue, arms, or legs, trouble swallowing or breathing Pain, tingling, or numbness in the hands or feet Side effects that usually do not require medical attention (report to your care team if they continue or are bothersome): Bone, joint, or muscle pain Constipation Headache Hot flashes Nausea Pain, redness, or irritation at injection site Unusual weakness or fatigue This list may not describe all possible side effects. Call your doctor for medical advice about side effects. You may report side effects to FDA at 1-800-FDA-1088. Where should I keep my medication? This medication is given in a hospital or clinic. It will not be stored at home. NOTE: This sheet is a summary. It may not cover all possible information. If you have questions about this medicine, talk to your doctor, pharmacist, or health care provider.  2023 Elsevier/Gold Standard (2007-09-20 00:00:00) Fam-Trastuzumab Deruxtecan Injection What is this medication? FAM-TRASTUZUMAB DERUXTECAN (fam-tras TOOZ eu mab DER ux TEE kan) treats some types of cancer. It works by blocking a protein that causes cancer cells to grow and multiply. This helps to slow or stop the spread of cancer cells. This medicine may be used for other purposes; ask your health care provider or pharmacist if you have questions. COMMON BRAND NAME(S): ENHERTU What should I tell my care team before I take this medication? They need to know if you have any of these conditions: Heart disease Heart failure Infection, especially a viral infection, such as chickenpox, cold sores, or herpes Liver disease Lung or breathing disease, such as asthma or COPD An unusual or allergic reaction to fam-trastuzumab deruxtecan, other medications, foods, dyes, or  preservatives Pregnant or trying to get pregnant Breast-feeding How should I use this medication? This medication is injected into a vein. It is given by your care team in a hospital or clinic setting. A special MedGuide will be given to you before each treatment. Be sure to read this information carefully each time. Talk to your care team about the use of this medication in children. Special care may be needed. Overdosage: If you think you have taken too much of this medicine contact a poison control center or emergency room at once. NOTE: This medicine is only for you. Do not share this medicine with others. What if I miss a dose? It is important not to miss your dose. Call your care team if you are unable to keep an appointment. What may interact with this medication? Interactions are not expected. This list may not describe all possible interactions. Give your health care provider a list of all the medicines, herbs, non-prescription drugs, or dietary supplements you use. Also tell them if you smoke, drink alcohol, or use illegal drugs. Some items may interact with your medicine. What should I watch for  while using this medication? Visit your care team for regular checks on your progress. Tell your care team if your symptoms do not start to get better or if they get worse. Your condition will be monitored carefully while you are receiving this medication. Do not become pregnant while taking this medication or for 7 months after stopping it. Women should inform their care team if they wish to become pregnant or think they might be pregnant. Men should not father a child while taking this medication and for 4 months after stopping it. There is potential for serious side effects to an unborn child. Talk to your care team for more information. Do not breast-feed an infant while taking this medication or for 7 months after the last dose. This medication has caused decreased sperm counts in some men.  This may make it more difficult to father a child. Talk to your care team if you are concerned about your fertility. This medication may increase your risk to bruise or bleed. Call your care team if you notice any unusual bleeding. Be careful brushing or flossing your teeth or using a toothpick because you may get an infection or bleed more easily. If you have any dental work done, tell your dentist you are receiving this medication. This medication may cause dry eyes and blurred vision. If you wear contact lenses, you may feel some discomfort. Lubricating eye drops may help. See your care team if the problem does not go away or is severe. This medication may increase your risk of getting an infection. Call your care team for advice if you get a fever, chills, sore throat, or other symptoms of a cold or flu. Do not treat yourself. Try to avoid being around people who are sick. Avoid taking medications that contain aspirin, acetaminophen, ibuprofen, naproxen, or ketoprofen unless instructed by your care team. These medications may hide a fever. What side effects may I notice from receiving this medication? Side effects that you should report to your care team as soon as possible: Allergic reactions--skin rash, itching, hives, swelling of the face, lips, tongue, or throat Dry cough, shortness of breath or trouble breathing Infection--fever, chills, cough, sore throat, wounds that don't heal, pain or trouble when passing urine, general feeling of discomfort or being unwell Heart failure--shortness of breath, swelling of the ankles, feet, or hands, sudden weight gain, unusual weakness or fatigue Unusual bruising or bleeding Side effects that usually do not require medical attention (report these to your care team if they continue or are bothersome): Constipation Diarrhea Hair loss Muscle pain Nausea Vomiting This list may not describe all possible side effects. Call your doctor for medical advice  about side effects. You may report side effects to FDA at 1-800-FDA-1088. Where should I keep my medication? This medication is given in a hospital or clinic. It will not be stored at home. NOTE: This sheet is a summary. It may not cover all possible information. If you have questions about this medicine, talk to your doctor, pharmacist, or health care provider.  2023 Elsevier/Gold Standard (2021-04-12 00:00:00)

## 2022-10-12 NOTE — Telephone Encounter (Signed)
*  STAT* If patient is at the pharmacy, call can be transferred to refill team.   1. Which medications need to be refilled? (please list name of each medication and dose if known)  valsartan (DIOVAN) 40 MG tablet   2. Which pharmacy/location (including street and city if local pharmacy) is medication to be sent to? [place]   3. Do they need a 30 day or 90 day supply? 90 day

## 2022-10-13 ENCOUNTER — Other Ambulatory Visit: Payer: Self-pay

## 2022-10-14 ENCOUNTER — Other Ambulatory Visit: Payer: Self-pay | Admitting: Hematology and Oncology

## 2022-10-14 DIAGNOSIS — C7951 Secondary malignant neoplasm of bone: Secondary | ICD-10-CM

## 2022-10-14 DIAGNOSIS — C50412 Malignant neoplasm of upper-outer quadrant of left female breast: Secondary | ICD-10-CM

## 2022-10-15 ENCOUNTER — Telehealth: Payer: Self-pay

## 2022-10-15 NOTE — Telephone Encounter (Signed)
I spoke with pt to see how she did over the weekend. Pt states "everything was good". Pt denies N/V, constipation, diarrhea, fever, chills, SOB, chest pain, skin rash, and itching. Pt able to complete her ADL's independently. I reminded pt to call us if she were to develop temp of 100.4 or higher, day or night. She verbalized understanding. We confirmed her next appt's here at [facility] as well.

## 2022-10-16 ENCOUNTER — Encounter: Payer: Self-pay | Admitting: Oncology

## 2022-10-18 ENCOUNTER — Ambulatory Visit: Payer: BC Managed Care – PPO | Attending: Hematology and Oncology

## 2022-10-18 DIAGNOSIS — C7951 Secondary malignant neoplasm of bone: Secondary | ICD-10-CM

## 2022-10-18 DIAGNOSIS — T451X5D Adverse effect of antineoplastic and immunosuppressive drugs, subsequent encounter: Secondary | ICD-10-CM | POA: Diagnosis not present

## 2022-10-18 LAB — ECHOCARDIOGRAM COMPLETE
Area-P 1/2: 7.82 cm2
Calc EF: 56.3 %
S' Lateral: 2.7 cm
Single Plane A2C EF: 60.6 %
Single Plane A4C EF: 54.1 %

## 2022-10-18 MED ORDER — PERFLUTREN LIPID MICROSPHERE
1.0000 mL | INTRAVENOUS | Status: AC | PRN
  Administered 2022-10-18: 5 mL via INTRAVENOUS

## 2022-10-19 ENCOUNTER — Encounter: Payer: Self-pay | Admitting: Cardiology

## 2022-10-19 ENCOUNTER — Inpatient Hospital Stay: Payer: BC Managed Care – PPO

## 2022-10-22 ENCOUNTER — Encounter: Payer: Self-pay | Admitting: Oncology

## 2022-10-22 ENCOUNTER — Other Ambulatory Visit: Payer: Self-pay

## 2022-10-22 ENCOUNTER — Inpatient Hospital Stay (INDEPENDENT_AMBULATORY_CARE_PROVIDER_SITE_OTHER): Payer: BC Managed Care – PPO | Admitting: Oncology

## 2022-10-22 ENCOUNTER — Inpatient Hospital Stay: Payer: BC Managed Care – PPO

## 2022-10-22 VITALS — BP 111/74 | HR 98 | Temp 98.7°F | Resp 17 | Ht 59.0 in | Wt 135.8 lb

## 2022-10-22 DIAGNOSIS — C7951 Secondary malignant neoplasm of bone: Secondary | ICD-10-CM | POA: Diagnosis not present

## 2022-10-22 DIAGNOSIS — Z170 Estrogen receptor positive status [ER+]: Secondary | ICD-10-CM | POA: Diagnosis not present

## 2022-10-22 DIAGNOSIS — Z780 Asymptomatic menopausal state: Secondary | ICD-10-CM

## 2022-10-22 DIAGNOSIS — C50412 Malignant neoplasm of upper-outer quadrant of left female breast: Secondary | ICD-10-CM

## 2022-10-22 DIAGNOSIS — M8580 Other specified disorders of bone density and structure, unspecified site: Secondary | ICD-10-CM | POA: Diagnosis not present

## 2022-10-22 LAB — CBC AND DIFFERENTIAL
HCT: 36 (ref 36–46)
Hemoglobin: 11.6 — AB (ref 12.0–16.0)
Neutrophils Absolute: 4.31
Platelets: 237 10*3/uL (ref 150–400)
WBC: 5.6

## 2022-10-22 LAB — BASIC METABOLIC PANEL
BUN: 19 (ref 4–21)
CO2: 32 — AB (ref 13–22)
Chloride: 102 (ref 99–108)
Creatinine: 0.5 (ref 0.5–1.1)
Glucose: 110
Potassium: 4 mEq/L (ref 3.5–5.1)
Sodium: 136 — AB (ref 137–147)

## 2022-10-22 LAB — HEPATIC FUNCTION PANEL
ALT: 26 U/L (ref 7–35)
AST: 50 — AB (ref 13–35)
Alkaline Phosphatase: 74 (ref 25–125)
Bilirubin, Total: 0.4

## 2022-10-22 LAB — COMPREHENSIVE METABOLIC PANEL
Albumin: 3.6 (ref 3.5–5.0)
Calcium: 8.3 — AB (ref 8.7–10.7)

## 2022-10-22 LAB — CBC: RBC: 4.55 (ref 3.87–5.11)

## 2022-10-22 MED ORDER — ATORVASTATIN CALCIUM 20 MG PO TABS: 20.0000 mg | ORAL_TABLET | Freq: Every day | ORAL | 0 refills | Status: DC

## 2022-10-22 NOTE — Progress Notes (Signed)
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 She was on adjuvant hormonal therapy with tamoxifen 20 mg daily from October 2015 to May 2020, but due to elevation of the liver transaminases, was switched to anastrazole at that time.   Malignant neoplasm metastatic to bone [facility]) New bone metastases, November 2022, with multiple marrow replacing lesions within the proximal right femur within the ischium as well as the inferior rami. 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 Ki67 was 60%. PET imaging from December 19th revealed dominant finding of intensely hypermetabolic aggressive skeletal metastasis involving the calvarium, sternum, thoracic and lumbar spine, and pelvis, with potential pathologic fracture of the right femur with internal fixation. There is soft tissue extension into the anterior mediastinum associated with the manubrial metastasis. Multiple spinal vertebral body lesions have cortical destruction along the posterior margin. She started monthly Xgeva in December, and completed radiation therapy. She was receiving THP but cycle 4 was delayed and dose reduction made by 20% due to left leg infection. She continued with Herceptin/ Perjeta, but we dropped the Taxotere chemotherapy. We then placed her on hormonal therapy with Faslodex injections.  She is now having progressive disease with pathologic fracture of the acetabulum so I switched her to [facility] and she had her first dose about 10 days ago. She did have expected toxicities.  Increased Left Hip Pain In January 2024, she developed increased pain of the left hip. She was found to have innumerable metastatic lesions throughout the pelvis and proximal femur. There is a non displaced pathologic fracture through a large lesion in the superior left acetabulum. She was referred to an orthopedic oncologist at [facility] and they recommended radiation and no surgery. She is now on palliative radiation.   Hypokalemia Her potassium came up from 3.4 to 3.5 last time despite taking potassium supplement at 20 mEq twice daily. I instructed her to increase it to 3 daily.  Hypocalcemia Her calcium is borderline at 8.8 but this is with taking 6 tablets daily.  Her vitamin D level was adequate.  I recommended she increase her calcium supplement to 3 tablets TID.  I stressed the importance of her calcium supplement.  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  She was found to be iron deficient in September 2023 and was given IV iron supplement. Her hemoglobin is stable at 11.0 now.   Abnormal Mammogram She does have thickening of the skin and trabecular thickening. I find no significant change on physical exam and feel that a lot of this can be post radiation change. In view of her wide spread bone metastases, I will not pursue further evaluation but will continue periodic breast exams, I have explained this to the patient and her husband.   Large Cyst of the Right Kidney This arises from the lower pole and hs  been present for a long time, previously measuring 11 mm on last scan from September, 2023. During her CT simulation for her left hip radiation, the physicist noted that the cyst is enlarged and so we  are due for follow up imaging anyway, and will re-evaluate. I also noted decreased breath sounds at both bases today and so would like to evaluate for possible pleural effusion vs atelectasis.   Plan:   She is currently taking Zofran and I suggested that she can take Ativan also, I suggested 1 half pill as needed. She is taking oxyodone 10mg  every 4-5 hours. I explained to her about Guardant testing. Her echocardiogram from 10/18/2022 revealed a slight decrease in the ejection fraction to 50-55%, her last one 3 months ago was 55-60%. Her next echocardiogram will be due in early June, 2024.  Her day 1 cycle 2 of Enhertu is scheduled on 11/02/2022. I will see her back on 10/31/2022 with CBC and CMP prior to that next dose. The patient understands the plans discussed today and is in agreement with them.  She knows to contact our office if she develops concerns prior to her next appointment.  I provided 20 minutes of face-to-face time during this encounter and > 50% was spent counseling as documented under my assessment and plan.    Derwood Kaplan, MD  [facility] [address] [place] [address] Dept: [phone] Dept Fax: [phone]   Orders Placed This Encounter  Procedures   CBC w Diff ([facility] scanned report) STAT    Standing Status:   Future    Standing Expiration Date:   10/22/2023   CMP ([facility] scanned report) STAT    Standing Status:   Future    Standing Expiration Date:   10/22/2023      CHIEF COMPLAINT:  CC: A 52 year old female with history of breast cancer here for 1 week evaluation  Current Treatment:  THP; will proceed with HP  INTERVAL HISTORY:  Jeanelly is here today for repeat clinical assessment for her breast cancer metastatic to bone.  Patient states she feels ok and complains of pain in her back and legs. She informed me that she experienced diarrhea for a couple of days after  treatment, and nausea after receiving her 1st Enhertu treatment 10 days ago. She is currently taking Zofran and I suggested that she can take Ativan also, I suggested 1 half pill as needed. Patient states that she was in a lot of pain at first after her treatment but it has now settled down. She is taking oxyodone 10mg  every 4-5 hours. I explained to her about  Guardant testing. Her echocardiogram from 10/18/2022 revealed a slight decrease in the ejection fraction to 50-55%, her last one 3 months ago was 55-60%. Her next echocardiogram will be due in early June, 2024.  Her day 1 cycle 2  of Enhertu is scheduled on 11/02/2022. I will see her back on 10/31/2022 with CBC and CMP. She denies signs of infection such as sore throat, sinus drainage, cough, or urinary symptoms.  She denies fevers or recurrent chills. She denies pain. She denies nausea, vomiting, chest pain, dyspnea or cough. Her appetite is good and her weight has been stable. She is accompanied today by her husband as usual.   I have reviewed the past medical history, past surgical history, social history and family history with the patient and they are unchanged from previous note.  ALLERGIES:  has No Known Allergies.  MEDICATIONS:  Current Outpatient Medications  Medication Sig Dispense Refill   albuterol (VENTOLIN HFA) 108 (90 Base) MCG/ACT inhaler Inhale 2 puffs into the lungs every 6 (six) hours as needed for shortness of breath.     ARIPiprazole (ABILIFY) 5 MG tablet Take 2.5 mg by mouth daily.     aspirin 81 MG EC tablet Take 81 mg by mouth daily. Swallow whole.     atorvastatin (LIPITOR) 20 MG tablet Take 1 tablet (20 mg total) by mouth daily. 90 tablet 0   augmented betamethasone dipropionate (DIPROLENE-AF) 0.05 % cream      calcium carbonate (OS-CAL) 1250 (500 Ca) MG chewable tablet Chew 6 tablets by mouth daily.     CLENPIQ 10-3.5-12 MG-GM -GM/160ML SOLN      desvenlafaxine (PRISTIQ) 50 MG 24 hr tablet Take 50 mg by mouth daily.      diphenoxylate-atropine (LOMOTIL) 2.5-0.025 MG tablet TAKE TWO TABLETS BY MOUTH FOUR TIMES A DAY AS NEEDED FOR DIARRHEA OR LOOSE STOOLS 100 tablet 1   FARXIGA 10 MG TABS tablet Take 1 tablet (10 mg total) by mouth daily. 30 tablet 11   furosemide (LASIX) 20 MG tablet Take 1 tablet (20 mg total) by mouth daily. (Patient taking differently: Take 20 mg by mouth as needed.) 30 tablet 0   labetalol (NORMODYNE) 200 MG tablet Take 1 tablet (200 mg total) by mouth 2 (two) times daily. 180 tablet 3   Loratadine 10 MG CAPS Take 1 capsule (10 mg total) by mouth daily. 30 capsule 6   LORazepam (ATIVAN) 1 MG tablet Take 1 tablet (1 mg total) by mouth every 8 (eight) hours. 30 tablet 0   LORazepam (ATIVAN) 1 MG tablet Take 1 mg by mouth every 8 (eight) hours as needed for anxiety (nausea or vomiting).     magic mouthwash (lidocaine, diphenhydrAMINE, alum & mag hydroxide) suspension Take 5 mLs by mouth every 3 (three) hours as needed. Swish and spit; for throat/mouth pain     naloxone (NARCAN) nasal spray 4 mg/0.1 mL One spray in nostril as needed, may repeat every 2 to 3 minutes until medical assistance becomes available 4 each 1   nitroGLYCERIN (NITROSTAT) 0.4 MG SL tablet Place 0.4 mg under the tongue every 5 (five) minutes as needed for chest pain.     nystatin (MYCOSTATIN/NYSTOP) powder Apply 1 Application topically 2 (two) times daily.     omeprazole (PRILOSEC) 40 MG capsule      ondansetron (ZOFRAN) 4 MG tablet Take 1 tablet (4 mg total) by mouth every 4 (four) hours as needed for nausea. 90 tablet 3   Oxycodone HCl 10 MG TABS Take 1 tablet (10 mg total) by mouth every 4 (four) hours as needed. 180 tablet 0   OZEMPIC, 0.25 OR 0.5 MG/DOSE, 2 MG/3ML SOPN Inject into the skin.     potassium chloride SA (KLOR-CON M) 20 MEQ tablet Take 1 tablet (20 mEq total) by mouth 2 (two) times daily. 60 tablet 0   prochlorperazine (COMPAZINE) 10 MG tablet Take 1 tablet (10 mg total) by mouth every 6 (six) hours as needed  for nausea or vomiting. 90 tablet 3   valsartan (DIOVAN) 40 MG tablet Take 1 tablet (40 mg total) by mouth daily. 30 tablet 0   Current Facility-Administered Medications  Medication Dose Route Frequency Provider Last Rate Last Admin   0.9 %  sodium chloride infusion  500 mL Intravenous Once Jackquline Denmark, MD  technetium sestamibi generic (CARDIOLITE) injection [id number] millicurie  [id number] millicurie Intravenous Once PRN Revankar, Reita Cliche, MD        HISTORY OF PRESENT ILLNESS:   Oncology History  Malignant neoplasm of upper-outer quadrant of left female breast ([place])  07/23/2013 Initial Diagnosis   Malignant neoplasm of upper-outer quadrant of left female breast ([place])   07/23/2013 Cancer Staging   Staging form: Breast, AJCC 7th Edition - Clinical stage from 07/23/2013: Stage IIB (T2, N1, M0) - Signed by Derwood Kaplan, MD on 07/04/2020 Prognostic indicators: Pos LVI   09/22/2021 - 04/06/2022 Chemotherapy   Patient is on Treatment Plan : BREAST  Trastuzumab + Pertuzumab q21d      09/22/2021 - 09/21/2022 Chemotherapy   Patient is on Treatment Plan : BREAST Trastuzumab + Pertuzumab q21d     10/12/2022 -  Chemotherapy   Patient is on Treatment Plan : BREAST METASTATIC Fam-Trastuzumab Deruxtecan-nxki (Enhertu) (5.4) q21d     Malignant neoplasm metastatic to bone ([place])  06/28/2021 Initial Diagnosis   Bone metastases ([place])   09/22/2021 - 04/06/2022 Chemotherapy   Patient is on Treatment Plan : BREAST  Trastuzumab + Pertuzumab q21d      09/22/2021 - 09/21/2022 Chemotherapy   Patient is on Treatment Plan : BREAST Trastuzumab + Pertuzumab q21d     05/11/2022 Imaging   CT chest, abdomen and pelvis:  IMPRESSION:  1. Widespread bony metastatic disease shows increased sclerosis  since previous imaging. This is likely related to response to  therapy in the interval. There is a single lesion along the LEFT  iliac which shows continued lytic change and warrants attention on  follow-up  2.  Interval development of pathologic fractures at in the upper  thoracic spine and at the thoracolumbar junction with associated  kyphosis.  3. No signs of solid organ or nodal metastatic disease.  4. Marked elevation of the LEFT hemidiaphragm as on the prior PET.  5. Aortic atherosclerosis.    10/12/2022 -  Chemotherapy   Patient is on Treatment Plan : BREAST METASTATIC Fam-Trastuzumab Deruxtecan-nxki (Enhertu) (5.4) q21d         REVIEW OF SYSTEMS:  Review of Systems  Constitutional:  Negative for appetite change, chills, diaphoresis, fatigue, fever and unexpected weight change.  HENT:  Negative.  Negative for hearing loss, lump/mass, mouth sores, nosebleeds, sore throat, tinnitus, trouble swallowing and voice change.   Eyes: Negative.  Negative for eye problems and icterus.  Respiratory:  Positive for shortness of breath (mild). Negative for chest tightness, cough, hemoptysis and wheezing.   Cardiovascular: Negative.  Negative for chest pain, leg swelling and palpitations.  Gastrointestinal:  Positive for nausea. Negative for abdominal distention, abdominal pain, blood in stool, constipation, diarrhea, rectal pain and vomiting.  Endocrine: Negative.   Genitourinary: Negative.  Negative for bladder incontinence, difficulty urinating, dyspareunia, dysuria, frequency, hematuria, menstrual problem, nocturia, pelvic pain, vaginal bleeding and vaginal discharge.   Musculoskeletal:  Positive for back pain. Negative for arthralgias, flank pain, gait problem, myalgias, neck pain and neck stiffness.       Leg pain  Skin: Negative.  Negative for itching, rash and wound.  Neurological: Negative.  Negative for dizziness, extremity weakness, gait problem, headaches, light-headedness, numbness, seizures and speech difficulty.  Hematological: Negative.  Negative for adenopathy. Does not bruise/bleed easily.  Psychiatric/Behavioral: Negative.  Negative for confusion, decreased concentration, depression,  sleep disturbance and suicidal ideas. The patient is not nervous/anxious.    VITALS:  Blood pressure 111/74, pulse 98, temperature 98.7 F (37.1  C), resp. rate 17, height 4\' 11"  (1.499 m), weight 135 lb 12.8 oz (61.6 kg), last menstrual period 08/18/2013, SpO2 98 %.  Wt Readings from Last 3 Encounters:  11/02/22 140 lb 12.8 oz (63.9 kg)  10/31/22 138 lb 8 oz (62.8 kg)  10/22/22 135 lb 12.8 oz (61.6 kg)    Body mass index is 27.43 kg/m.  Performance status (ECOG): 1 - Symptomatic but completely ambulatory  PHYSICAL EXAM:  Physical Exam Vitals and nursing note reviewed. Exam conducted with a chaperone present.  Constitutional:      General: She is not in acute distress.    Appearance: Normal appearance. She is normal weight. She is not ill-appearing, toxic-appearing or diaphoretic.  HENT:     Head: Normocephalic and atraumatic.     Right Ear: Tympanic membrane, ear canal and external ear normal. There is no impacted cerumen.     Left Ear: Tympanic membrane, ear canal and external ear normal. There is no impacted cerumen.     Nose: Nose normal. No congestion or rhinorrhea.     Mouth/Throat:     Mouth: Mucous membranes are moist.     Pharynx: Oropharynx is clear. No oropharyngeal exudate or posterior oropharyngeal erythema.  Eyes:     General: No scleral icterus.       Right eye: No discharge.        Left eye: No discharge.     Extraocular Movements: Extraocular movements intact.     Conjunctiva/sclera: Conjunctivae normal.     Pupils: Pupils are equal, round, and reactive to light.  Neck:     Vascular: No carotid bruit.  Cardiovascular:     Rate and Rhythm: Normal rate and regular rhythm.     Pulses: Normal pulses.     Heart sounds: Normal heart sounds. No murmur heard.    No friction rub. No gallop.  Pulmonary:     Effort: Pulmonary effort is normal. No respiratory distress.     Breath sounds: Normal breath sounds. No stridor or decreased air movement. No wheezing, rhonchi  or rales.  Chest:     Chest wall: No tenderness.  Abdominal:     General: Bowel sounds are normal. There is no distension.     Palpations: Abdomen is soft. There is no hepatomegaly, splenomegaly or mass.     Tenderness: There is no abdominal tenderness. There is no right CVA tenderness, left CVA tenderness, guarding or rebound.     Hernia: No hernia is present.  Musculoskeletal:        General: No swelling, tenderness, deformity or signs of injury. Normal range of motion.     Cervical back: Normal range of motion and neck supple. No rigidity or tenderness.     Right lower leg: No edema.     Left lower leg: No edema.     Comments: Legs look stable still very erythematous and dry with some edema and induration.  Lymphadenopathy:     Cervical: No cervical adenopathy.     Right cervical: No superficial, deep or posterior cervical adenopathy.    Left cervical: No superficial, deep or posterior cervical adenopathy.     Upper Body:     Right upper body: No supraclavicular, axillary or pectoral adenopathy.     Left upper body: No supraclavicular, axillary or pectoral adenopathy.  Skin:    General: Skin is warm and dry.     Coloration: Skin is not jaundiced or pale.     Findings: No bruising, erythema, lesion or  rash.  Neurological:     General: No focal deficit present.     Mental Status: She is alert and oriented to person, place, and time. Mental status is at baseline.     Cranial Nerves: No cranial nerve deficit.     Sensory: No sensory deficit.     Motor: No weakness.     Coordination: Coordination normal.     Gait: Gait normal.     Deep Tendon Reflexes: Reflexes normal.  Psychiatric:        Mood and Affect: Mood normal.        Behavior: Behavior normal.        Thought Content: Thought content normal.        Judgment: Judgment normal.    LABORATORY DATA:  I have reviewed the data as listed    Component Value Date/Time   NA 141 10/31/2022 1225   NA 136 (A) 10/22/2022 0000    K 3.3 (L) 10/31/2022 1225   CL 102 10/31/2022 1225   CO2 31 10/31/2022 1225   GLUCOSE 83 10/31/2022 1225   BUN 10 10/31/2022 1225   BUN 19 10/22/2022 0000   CREATININE 0.69 10/31/2022 1225   CALCIUM 8.9 10/31/2022 1225   PROT 6.5 10/31/2022 1225   ALBUMIN 3.8 10/31/2022 1225   AST 19 10/31/2022 1225   ALT 21 10/31/2022 1225   ALKPHOS 68 10/31/2022 1225   BILITOT 0.2 (L) 10/31/2022 1225   GFRNONAA >60 10/31/2022 1225    No results found for: "SPEP", "UPEP"  Lab Results  Component Value Date   WBC 4.1 10/31/2022   NEUTROABS 2.5 10/31/2022   HGB 11.6 (L) 10/31/2022   HCT 38.4 10/31/2022   MCV 86.5 10/31/2022   PLT 292 10/31/2022     Chemistry      Component Value Date/Time   NA 141 10/31/2022 1225   NA 136 (A) 10/22/2022 0000   K 3.3 (L) 10/31/2022 1225   CL 102 10/31/2022 1225   CO2 31 10/31/2022 1225   BUN 10 10/31/2022 1225   BUN 19 10/22/2022 0000   CREATININE 0.69 10/31/2022 1225   GLU 110 10/22/2022 0000      Component Value Date/Time   CALCIUM 8.9 10/31/2022 1225   ALKPHOS 68 10/31/2022 1225   AST 19 10/31/2022 1225   ALT 21 10/31/2022 1225   BILITOT 0.2 (L) 10/31/2022 1225     Component Ref Range & Units 13 d ago (10/09/22) 1 mo ago (08/29/22) 3 mo ago (06/28/22) 1 yr ago (09/06/21)  CA 27.29 0.0 - 38.6 U/mL 53.8 High  57.7 High  CM 50.9 High  CM 106.6 High  CM   RADIOGRAPHIC STUDIES: Exam: 10/18/2022 Echocardiogram IMPRESSIONS   1. TDS, decrease LVEF as compared to echo from 07/16/2022. Left  ventricular ejection fraction, by estimation, is 50 to 55%. The left  ventricle has low normal function. The left ventricle has no regional wall  motion abnormalities. Left ventricular  diastolic parameters are consistent with Grade I diastolic dysfunction  (impaired relaxation).   2. Right ventricular systolic function is normal. The right ventricular  size is normal. There is normal pulmonary artery systolic pressure.   3. The mitral valve is normal in  structure. No evidence of mitral valve  regurgitation. No evidence of mitral stenosis.   4. The aortic valve is normal in structure. Aortic valve regurgitation is  not visualized. No aortic stenosis is present.   5. The inferior vena cava is normal in size with greater than  50%  respiratory variability, suggesting right atrial pressure of 3 mmHg.   FINDINGS   Left Ventricle: TDS, decrease LVEF as compared to echo from 07/16/2022.  Left ventricular ejection fraction, by estimation, is 50 to 55%. The left  ventricle has low normal function. The left ventricle has no regional wall  motion abnormalities. Definity  contrast agent was given IV to delineate the left ventricular endocardial  borders. The left ventricular internal cavity size was normal in size.  There is no left ventricular hypertrophy. Left ventricular diastolic  parameters are consistent with Grade I  diastolic dysfunction (impaired relaxation).  Right Ventricle: The right ventricular size is normal. No increase in  right ventricular wall thickness. Right ventricular systolic function is  normal. There is normal pulmonary artery systolic pressure. The tricuspid  regurgitant velocity is 2.24 m/s, and   with an assumed right atrial pressure of 3 mmHg, the estimated right  ventricular systolic pressure is [id number] mmHg.  Left Atrium: Left atrial size was normal in size.  Right Atrium: Right atrial size was normal in size.  Pericardium: There is no evidence of pericardial effusion.  Mitral Valve: The mitral valve is normal in structure. No evidence of  mitral valve regurgitation. No evidence of mitral valve stenosis.  Tricuspid Valve: The tricuspid valve is normal in structure. Tricuspid  valve regurgitation is mild . No evidence of tricuspid stenosis.  Aortic Valve: The aortic valve is normal in structure. Aortic valve  regurgitation is not visualized. No aortic stenosis is present.  Pulmonic Valve: The pulmonic valve was normal in  structure. Pulmonic valve  regurgitation is not visualized. No evidence of pulmonic stenosis.  Aorta: The aortic root is normal in size and structure.  Venous: The inferior vena cava is normal in size with greater than 50%  respiratory variability, suggesting right atrial pressure of 3 mmHg.  IAS/Shunts: No atrial level shunt detected by color flow Doppler.            I,Jasmine M Lassiter,acting as a scribe for Derwood Kaplan, MD.,have documented all relevant documentation on the behalf of Derwood Kaplan, MD,as directed by  Derwood Kaplan, MD while in the presence of Derwood Kaplan, MD.

## 2022-10-31 ENCOUNTER — Inpatient Hospital Stay (INDEPENDENT_AMBULATORY_CARE_PROVIDER_SITE_OTHER): Payer: BC Managed Care – PPO | Admitting: Oncology

## 2022-10-31 ENCOUNTER — Encounter: Payer: Self-pay | Admitting: Oncology

## 2022-10-31 ENCOUNTER — Ambulatory Visit: Payer: BC Managed Care – PPO | Admitting: Hematology and Oncology

## 2022-10-31 ENCOUNTER — Inpatient Hospital Stay: Payer: BC Managed Care – PPO

## 2022-10-31 ENCOUNTER — Other Ambulatory Visit: Payer: Self-pay | Admitting: Oncology

## 2022-10-31 VITALS — BP 118/77 | HR 78 | Temp 98.8°F | Resp 17 | Ht 59.0 in | Wt 138.5 lb

## 2022-10-31 DIAGNOSIS — Z170 Estrogen receptor positive status [ER+]: Secondary | ICD-10-CM | POA: Diagnosis not present

## 2022-10-31 DIAGNOSIS — M8580 Other specified disorders of bone density and structure, unspecified site: Secondary | ICD-10-CM

## 2022-10-31 DIAGNOSIS — C7951 Secondary malignant neoplasm of bone: Secondary | ICD-10-CM

## 2022-10-31 DIAGNOSIS — C50412 Malignant neoplasm of upper-outer quadrant of left female breast: Secondary | ICD-10-CM | POA: Diagnosis not present

## 2022-10-31 DIAGNOSIS — Z780 Asymptomatic menopausal state: Secondary | ICD-10-CM

## 2022-10-31 DIAGNOSIS — Z5111 Encounter for antineoplastic chemotherapy: Secondary | ICD-10-CM | POA: Diagnosis not present

## 2022-10-31 LAB — CMP (CANCER CENTER ONLY)
ALT: 21 U/L (ref 0–44)
AST: 19 U/L (ref 15–41)
Albumin: 3.8 g/dL (ref 3.5–5.0)
Alkaline Phosphatase: 68 U/L (ref 38–126)
Anion gap: 8 (ref 5–15)
BUN: 10 mg/dL (ref 6–20)
CO2: 31 mmol/L (ref 22–32)
Calcium: 8.9 mg/dL (ref 8.9–10.3)
Chloride: 102 mmol/L (ref 98–111)
Creatinine: 0.69 mg/dL (ref 0.44–1.00)
GFR, Estimated: >60 mL/min (ref >60)
Glucose, Bld: 83 mg/dL (ref 70–99)
Potassium: 3.3 mmol/L — ABNORMAL LOW (ref 3.5–5.1)
Sodium: 141 mmol/L (ref 135–145)
Total Bilirubin: 0.2 mg/dL — ABNORMAL LOW (ref 0.3–1.2)
Total Protein: 6.5 g/dL (ref 6.5–8.1)

## 2022-10-31 LAB — CBC WITH DIFFERENTIAL (CANCER CENTER ONLY)
Abs Immature Granulocytes: 0.01 10*3/uL (ref 0.00–0.07)
Basophils Absolute: 0 10*3/uL (ref 0.0–0.1)
Basophils Relative: 1 %
Eosinophils Absolute: 0.2 10*3/uL (ref 0.0–0.5)
Eosinophils Relative: 5 %
HCT: 38.4 % (ref 36.0–46.0)
Hemoglobin: 11.6 g/dL — ABNORMAL LOW (ref 12.0–15.0)
Immature Granulocytes: 0 %
Lymphocytes Relative: 20 %
Lymphs Abs: 0.8 10*3/uL (ref 0.7–4.0)
MCH: 26.1 pg (ref 26.0–34.0)
MCHC: 30.2 g/dL (ref 30.0–36.0)
MCV: 86.5 fL (ref 80.0–100.0)
Monocytes Absolute: 0.5 10*3/uL (ref 0.1–1.0)
Monocytes Relative: 12 %
Neutro Abs: 2.5 10*3/uL (ref 1.7–7.7)
Neutrophils Relative %: 62 %
Platelet Count: 292 10*3/uL (ref 150–400)
RBC: 4.44 MIL/uL (ref 3.87–5.11)
RDW: 20.3 % — ABNORMAL HIGH (ref 11.5–15.5)
WBC Count: 4.1 10*3/uL (ref 4.0–10.5)
nRBC: 0 % (ref 0.0–0.2)

## 2022-10-31 NOTE — Progress Notes (Signed)
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 She was on adjuvant hormonal therapy with tamoxifen 20 mg daily from October 2015 to May 2020, but due to elevation of the liver transaminases, was switched to anastrazole.   Malignant neoplasm metastatic to bone [facility]) New bone metastases, November 2022, with multiple marrow replacing lesions within the proximal right femur within the ischium as well as the inferior rami. 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 Ki67 was 60%. PET imaging from December 19th revealed dominant finding of intensely hypermetabolic aggressive skeletal metastasis involving the calvarium, sternum, thoracic and lumbar spine, and pelvis, with potential pathologic fracture of the right femur with internal fixation. There is soft tissue extension into the anterior mediastinum associated with the manubrial metastasis. Multiple spinal vertebral body lesions have cortical destruction along the posterior margin. She started monthly Xgeva in December, and completed radiation therapy. She was receiving THP but cycle 4 was delayed and dose reduction made by 20% due to left leg infection. She will continue with Herceptin/ Perjeta, but we dropped the Taxotere chemotherapy. We then placed her on hormonal therapy with Faslodex injections.     Increased Left Hip Pain In January 2024, she developed increased pain of the left hip. She was found to have innumerable metastatic lesions throughout the pelvis and proximal femur. There is a non displaced pathologic fracture through a large lesion in the superior left acetabulum. She was referred to an orthopedic oncologist at [facility] and they recommended radiation and no surgery. She has now received her palliative radiation. She will follow-up with [facility] in April.    Hypokalemia Her potassium only came up from 3.4 to 3.5 last time despite taking potassium supplement at 20 mEq twice daily. I instructed her to increase it to 3 daily.  Her potassium is still only 3.3 today.   Hypocalcemia Her calcium is borderline but this is with taking 6 tablets daily.  Her vitamin D level was adequate.  I recommended she increase her calcium supplement to 3 tablets TID.  I stressed the importance of her calcium supplement.   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  She was found to be iron deficient in September 2023 and was given IV iron supplement. Her hemoglobin was stable at 11.6 last time.    Abnormal Mammogram She does have thickening of the skin and trabecular thickening of the left breast. I find no significant change on physical exam and feel that a lot of this can be post radiation change. In view of her widespread bone metastases, I will not pursue further evaluation but will continue periodic breast exams.  On CT scan this nodule of the left breast appears to be 12 mm, down from 13 mm in size   Large Cyst of the Right Kidney This arises from the lower pole and  has been present for a long time, previously measuring 11 mm on last scan from September, 2023. During her CT simulation for her left hip radiation, the physicist noted that the cyst is enlarged and so we did re-evaluate.  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   Elevated left hemidiaphragm This is a new finding since the scan of 2020 and may be related to mediastinal involvement.  Is associated with atelectasis at the left base and explains why I heard decreased breath sounds in this area.  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  For now we will continue the Enhertu and Faslodex but keep this in mind for the future.  Plan:  CT scan from February confirmed worsening bone metastases as well as adenopathy in the mediastinum and axilla.  She has a small nodule of the left breast measuring 12 mm and a benign-appearing large cyst of the right kidney.  She is also noted to have elevation of the left hemidiaphragm associated with atelectasis at the left base.  Her treatment was therefore changed to Enhertu along with Faslodex injections and continued Xgeva monthly.  She has a follow-up with her cardiologist in May and we plan an echocardiogram by early June at the latest. She will see an orthopedic oncology specialist at [facility] on 11/28/2022 to further evaluate of the fracture of her left hip. Her day 1 cycle 2 is schedule on 11/02/2022. She continues to receive Faslodex and Xgeva injections every 4 weeks.  I will see her back in 2 weeks with CBC, CMP, and CA 27.29. The patient understands the plans discussed today and is in agreement with them.  She knows to contact our office if she develops concerns prior to her next appointment.   I provided 20 minutes of face-to-face time during this encounter and > 50% was spent counseling as documented under my assessment and plan.    Dellia Beckwith, MD  [facility] AT [facility] [address] [place] [address] Dept: [phone] Dept Fax: [phone]   No orders of the defined  types were placed in this encounter.     CHIEF COMPLAINT:  CC: A 52 year old female with history of breast cancer here for 1 week evaluation  Current Treatment:  THP; will proceed with HP  INTERVAL HISTORY:  Efrain is here today for repeat clinical assessment for her breast cancer metastatic to bone.  Her echocardiogram from 10/18/2022 revealed a slight decrease in the ejection fraction to 50-55%, her last one 3 months ago was 55-60%. Her next echocardiogram will be due in early June, 2024. Patient states that she is ok and has been tolerating pain with oxyodone 10mg  every 4-5 hours. She also complains about lower back pain. 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 I informed her that this opens up other treatment options that targets her specific mutation if her condition worsens. She experiences nausea, fatigue, and body pain after her Enhertu treatment during the first week but is tolerating it. Her day 1 cycle 2 is schedule on 11/02/2022. She continues to receive Faslodex and Xgeva injections every 4 weeks. She will see an orthopedic oncology specialist at [facility] on 11/28/2022 to further evaluate of the fracture of her left hip. Her CA 27.29 came down from  57.7 to 53.8 by the end of February.  I will see her back in 3 weeks with CBC, CMP, and CA 27.29.  She denies signs of infection such as sore throat, sinus drainage, cough, or urinary symptoms.  She denies fevers or recurrent chills. She denies pain. She denies nausea, vomiting, chest pain, dyspnea or cough. Her weight has increased 2 pounds over last 19 days . She is accompanied today by her husband as usual.   I have reviewed the past medical history, past surgical history, social history and family history with the patient and they are unchanged from previous note.  ALLERGIES:  has No Known Allergies.  MEDICATIONS:  Current Outpatient Medications  Medication Sig Dispense Refill   albuterol (VENTOLIN HFA)  108 (90 Base) MCG/ACT inhaler Inhale 2 puffs into the lungs every 6 (six) hours as needed for shortness of breath.     ARIPiprazole (ABILIFY) 5 MG tablet Take 2.5 mg by mouth daily.     aspirin 81 MG EC tablet Take 81 mg by mouth daily. Swallow whole.     atorvastatin (LIPITOR) 20 MG tablet Take 1 tablet (20 mg total) by mouth daily. 90 tablet 0   augmented betamethasone dipropionate (DIPROLENE-AF) 0.05 % cream      calcium carbonate (TUMS - DOSED IN MG ELEMENTAL CALCIUM) 500 MG chewable tablet Chew 1 tablet by mouth daily. 4 gummies daily/ not tablet form .     CLENPIQ 10-3.5-12 MG-GM -GM/160ML SOLN      desvenlafaxine (PRISTIQ) 50 MG 24 hr tablet Take 50 mg by mouth daily.     diphenoxylate-atropine (LOMOTIL) 2.5-0.025 MG tablet TAKE TWO TABLETS BY MOUTH FOUR TIMES A DAY AS NEEDED FOR DIARRHEA OR LOOSE STOOLS 100 tablet 1   FARXIGA 10 MG TABS tablet Take 1 tablet (10 mg total) by mouth daily. 30 tablet 11   furosemide (LASIX) 20 MG tablet Take 1 tablet (20 mg total) by mouth daily. (Patient taking differently: Take 20 mg by mouth as needed.) 30 tablet 0   gabapentin (NEURONTIN) 300 MG capsule Take 1 capsule (300 mg total) by mouth at bedtime. 30 capsule 1   labetalol (NORMODYNE) 200 MG tablet Take 1 tablet (200 mg total) by mouth 2 (two) times daily. 180 tablet 3   Loratadine 10 MG CAPS Take 1 capsule (10 mg total) by mouth daily. 30 capsule 6   LORazepam (ATIVAN) 1 MG tablet Take 1 tablet (1 mg total) by mouth every 8 (eight) hours. 30 tablet 0   magic mouthwash (lidocaine, diphenhydrAMINE, alum & mag hydroxide) suspension Take 5 mLs by mouth every 3 (three) hours as needed. Swish and spit; for throat/mouth pain     naloxone (NARCAN) nasal spray 4 mg/0.1 mL One spray in nostril as needed, may repeat every 2 to 3 minutes until medical assistance becomes available 4 each 1   nitroGLYCERIN (NITROSTAT) 0.4 MG SL tablet Place 0.4 mg under the tongue every 5 (five) minutes as needed for chest pain.      nystatin (MYCOSTATIN/NYSTOP) powder Apply 1 Application topically 2 (two) times daily.     ondansetron (ZOFRAN) 4 MG tablet Take 1 tablet (4 mg total) by mouth every 4 (four) hours as needed for nausea. 90 tablet 3   Oxycodone HCl 10 MG TABS TAKE ONE TABLET BY MOUTH EVERY 4 HOURS AS NEEDED FOR PAIN 180 tablet 0   OZEMPIC, 0.25 OR 0.5 MG/DOSE, 2 MG/3ML SOPN Inject into the skin.     potassium chloride SA (KLOR-CON M)  20 MEQ tablet Take 1 tablet (20 mEq total) by mouth 2 (two) times daily. 60 tablet 0   prochlorperazine (COMPAZINE) 10 MG tablet Take 1 tablet (10 mg total) by mouth every 6 (six) hours as needed for nausea or vomiting. 90 tablet 3   valsartan (DIOVAN) 40 MG tablet TAKE ONE TABLET BY MOUTH EVERY DAY 30 tablet 2   Current Facility-Administered Medications  Medication Dose Route Frequency Provider Last Rate Last Admin   0.9 %  sodium chloride infusion  500 mL Intravenous Once Lynann Bologna, MD       technetium sestamibi generic (CARDIOLITE) injection 10.5 millicurie  10.5 millicurie Intravenous Once PRN Revankar, Aundra Dubin, MD       Facility-Administered Medications Ordered in Other Visits  Medication Dose Route Frequency Provider Last Rate Last Admin   acetaminophen (TYLENOL) tablet 650 mg  650 mg Oral Once Dellia Beckwith, MD       diphenhydrAMINE (BENADRYL) capsule 50 mg  50 mg Oral Once Dellia Beckwith, MD       sodium chloride flush (NS) 0.9 % injection 10 mL  10 mL Intracatheter PRN Dellia Beckwith, MD   10 mL at 11/23/22 1249    HISTORY OF PRESENT ILLNESS:   Oncology History  Malignant neoplasm of upper-outer quadrant of left female breast  07/23/2013 Initial Diagnosis   Malignant neoplasm of upper-outer quadrant of left female breast (HCC)   07/23/2013 Cancer Staging   Staging form: Breast, AJCC 7th Edition - Clinical stage from 07/23/2013: Stage IIB (T2, N1, M0) - Signed by Dellia Beckwith, MD on 07/04/2020 Prognostic indicators: Pos LVI    09/22/2021 - 04/06/2022 Chemotherapy   Patient is on Treatment Plan : BREAST  Trastuzumab + Pertuzumab q21d      09/22/2021 - 09/21/2022 Chemotherapy   Patient is on Treatment Plan : BREAST Trastuzumab + Pertuzumab q21d     10/12/2022 -  Chemotherapy   Patient is on Treatment Plan : BREAST METASTATIC Fam-Trastuzumab Deruxtecan-nxki (Enhertu) (5.4) q21d     Malignant neoplasm metastatic to bone  06/28/2021 Initial Diagnosis   Bone metastases (HCC)   09/22/2021 - 04/06/2022 Chemotherapy   Patient is on Treatment Plan : BREAST  Trastuzumab + Pertuzumab q21d      09/22/2021 - 09/21/2022 Chemotherapy   Patient is on Treatment Plan : BREAST Trastuzumab + Pertuzumab q21d     05/11/2022 Imaging   CT chest, abdomen and pelvis:  IMPRESSION:  1. Widespread bony metastatic disease shows increased sclerosis  since previous imaging. This is likely related to response to  therapy in the interval. There is a single lesion along the LEFT  iliac which shows continued lytic change and warrants attention on  follow-up  2. Interval development of pathologic fractures at in the upper  thoracic spine and at the thoracolumbar junction with associated  kyphosis.  3. No signs of solid organ or nodal metastatic disease.  4. Marked elevation of the LEFT hemidiaphragm as on the prior PET.  5. Aortic atherosclerosis.    10/12/2022 -  Chemotherapy   Patient is on Treatment Plan : BREAST METASTATIC Fam-Trastuzumab Deruxtecan-nxki (Enhertu) (5.4) q21d         REVIEW OF SYSTEMS:  Review of Systems  Constitutional:  Negative for appetite change, chills, diaphoresis, fatigue, fever and unexpected weight change.  HENT:  Negative.  Negative for hearing loss, lump/mass, mouth sores, nosebleeds, sore throat, tinnitus, trouble swallowing and voice change.   Eyes: Negative.  Negative for eye problems and  icterus.  Respiratory:  Positive for shortness of breath (mild). Negative for chest tightness, cough, hemoptysis and  wheezing.   Cardiovascular: Negative.  Negative for chest pain, leg swelling and palpitations.  Gastrointestinal: Negative.  Negative for abdominal distention, abdominal pain, blood in stool, constipation, diarrhea, nausea, rectal pain and vomiting.  Endocrine: Negative.   Genitourinary: Negative.  Negative for bladder incontinence, difficulty urinating, dyspareunia, dysuria, frequency, hematuria, menstrual problem, nocturia, pelvic pain, vaginal bleeding and vaginal discharge.   Musculoskeletal:  Positive for back pain (5/10 and pain in her left hip.). Negative for arthralgias, flank pain, gait problem, myalgias, neck pain and neck stiffness.       This is severe and the MRI is extremely abnormal with a non displaced pathologic fracture through the superior left acetabulum. Persistent pain of the left hip.  Skin:  Negative for itching, rash and wound.       Severe dry skin.  Neurological: Negative.  Negative for dizziness, extremity weakness, gait problem, headaches, light-headedness, numbness, seizures and speech difficulty.  Hematological: Negative.  Negative for adenopathy. Does not bruise/bleed easily.  Psychiatric/Behavioral: Negative.  Negative for confusion, decreased concentration, depression, sleep disturbance and suicidal ideas. The patient is not nervous/anxious.    VITALS:  Blood pressure 118/77, pulse 78, temperature 98.8 F (37.1 C), temperature source Oral, resp. rate 17, height 4\' 11"  (1.499 m), weight 138 lb 8 oz (62.8 kg), last menstrual period 08/18/2013, SpO2 98 %.  Wt Readings from Last 3 Encounters:  11/23/22 136 lb 1.3 oz (61.7 kg)  11/21/22 137 lb 14.4 oz (62.6 kg)  11/09/22 135 lb (61.2 kg)    Body mass index is 27.97 kg/m.  Performance status (ECOG): 1 - Symptomatic but completely ambulatory  PHYSICAL EXAM:  Physical Exam Vitals and nursing note reviewed. Exam conducted with a chaperone present.  Constitutional:      General: She is not in acute distress.     Appearance: Normal appearance. She is normal weight. She is not ill-appearing, toxic-appearing or diaphoretic.  HENT:     Head: Normocephalic and atraumatic.     Right Ear: Tympanic membrane, ear canal and external ear normal. There is no impacted cerumen.     Left Ear: Tympanic membrane, ear canal and external ear normal. There is no impacted cerumen.     Nose: Nose normal. No congestion or rhinorrhea.     Mouth/Throat:     Mouth: Mucous membranes are moist.     Pharynx: Oropharynx is clear. No oropharyngeal exudate or posterior oropharyngeal erythema.  Eyes:     General: No scleral icterus.       Right eye: No discharge.        Left eye: No discharge.     Extraocular Movements: Extraocular movements intact.     Conjunctiva/sclera: Conjunctivae normal.     Pupils: Pupils are equal, round, and reactive to light.  Neck:     Vascular: No carotid bruit.  Cardiovascular:     Rate and Rhythm: Normal rate and regular rhythm.     Pulses: Normal pulses.     Heart sounds: Normal heart sounds. No murmur heard.    No friction rub. No gallop.  Pulmonary:     Effort: Pulmonary effort is normal. No respiratory distress.     Breath sounds: Normal breath sounds. No stridor or decreased air movement. No wheezing, rhonchi or rales.  Chest:     Chest wall: No tenderness.     Comments: Well healed scar in the upper outer of  the left breast. Firmness diffusely in the left breast consistent with post radiation changes and the thickening of skin in both breast.  Abdominal:     General: Bowel sounds are normal. There is no distension.     Palpations: Abdomen is soft. There is no hepatomegaly, splenomegaly or mass.     Tenderness: There is no abdominal tenderness. There is no right CVA tenderness, left CVA tenderness, guarding or rebound.     Hernia: No hernia is present.  Musculoskeletal:        General: No swelling, tenderness, deformity or signs of injury. Normal range of motion.     Cervical back:  Normal range of motion and neck supple. No rigidity or tenderness.     Right lower leg: No edema.     Left lower leg: No edema.     Comments: Legs look stable still very erythematous and dry with some edema and induration.  Lymphadenopathy:     Cervical: No cervical adenopathy.     Right cervical: No superficial, deep or posterior cervical adenopathy.    Left cervical: No superficial, deep or posterior cervical adenopathy.     Upper Body:     Right upper body: No supraclavicular, axillary or pectoral adenopathy.     Left upper body: No supraclavicular, axillary or pectoral adenopathy.  Skin:    General: Skin is warm and dry.     Coloration: Skin is not jaundiced or pale.     Findings: No bruising, erythema, lesion or rash.     Comments: Right leg has erythema and dry skin Left leg has severe erythema with pustules and areas of ulceration and scaling.   Neurological:     General: No focal deficit present.     Mental Status: She is alert and oriented to person, place, and time. Mental status is at baseline.     Cranial Nerves: No cranial nerve deficit.     Sensory: No sensory deficit.     Motor: No weakness.     Coordination: Coordination normal.     Gait: Gait normal.     Deep Tendon Reflexes: Reflexes normal.  Psychiatric:        Mood and Affect: Mood normal.        Behavior: Behavior normal.        Thought Content: Thought content normal.        Judgment: Judgment normal.    LABORATORY DATA:  I have reviewed the data as listed CBC 9 d ago (10/22/22) 3 wk ago (10/09/22) 1 mo ago (09/18/22) 2 mo ago (08/29/22) 2 mo ago (08/08/22) 3 mo ago (07/18/22) 3 mo ago (07/11/22)  Hemoglobin 12.0 - 16.0 11.6 Abnormal  12.1 R 11.0 Low  R 11.2 Low  R 10.2 Low  R 10.4 Low  R 10.5 Low  R  HCT 36 - 46 36 41.2 R 37.6 R 39.2 R 35.2 Low  R 36.1 R 36.1 R  Neutrophils Absolute 4.31 3.3 R 3.7 R 4.2 R 4.5 R 4.8 R 4.5 R  Platelets 150 - 400 K/uL 237 212 203 527 High  397 443 High  396  WBC 5.6 5.0  R 5.4 R 6.2 R 6.3 R 6.7 R 6.5 R   CMP 9 d ago (10/22/22) 3 wk ago (10/09/22) 1 mo ago (09/18/22) 2 mo ago (08/29/22) 2 mo ago (08/08/22) 3 mo ago (07/18/22) 3 mo ago (07/11/22)  Glucose 110 106 High  R, CM 90 R, CM 78 R, CM 87 R, CM  86 R, CM 117 High  R, CM  BUN 4 - R 15 R 10 R 14 R 13 R 13 R  CO2 13 - 22 32 Abnormal  29 R 28 R 30 R 30 R 30 R 28 R  Creatinine 0.5 - 1.1 0.5 0.66 R 0.59 R 0.74 R 0.60 R 0.60 R 0.65 R  Potassium 3.5 - 5.1 mEq/L 4.0 4.2 R 3.5 R 3.4 Low  R 3.2 Low  R 3.7 R 3.2 Low  R  Sodium 137 - 147 136 Abnormal  139 R 140 R 142 R 143 R 143 R 142 R  Chloride 99 - 108 102 104 R 104 R 104 R 106 R 105 R 106 R   Component Ref Range & Units 9 d ago (10/22/22) 3 wk ago (10/09/22) 1 mo ago (09/18/22) 2 mo ago (08/29/22) 2 mo ago (08/08/22) 3 mo ago (07/18/22) 3 mo ago (07/11/22)  Calcium 8.7 - 10.7 8.3 Abnormal  8.8 Low  R 8.4 Low  R 8.7 Low  R 8.8 Low  R 8.8 Low  R 8.9 R  Albumin 3.5 - 5.0 3.6 3.8 R 3.7 R 3.5 R 3.3 Low  R 3.6 R 3.5 R   Component Ref Range & Units 9 d ago (10/22/22) 3 wk ago (10/09/22) 1 mo ago (09/18/22) 2 mo ago (08/29/22) 2 mo ago (08/08/22) 3 mo ago (07/18/22) 3 mo ago (07/11/22)  Alkaline Phosphatase 25 - 125 74 89 R 117 R 261 High  R 226 High  R 273 High  R 301 High  R  ALT 7 - 35 U/L 26 22 R 15 R 24 R 28 R 36 R 54 High  R  AST 13 - 35 50 Abnormal  23 R 17 R 23 R 21 R 24 R 42 High  R         Component Value Date/Time   NA 138 11/21/2022 1342   NA 136 (A) 10/22/2022 0000   K 3.6 11/21/2022 1342   CL 101 11/21/2022 1342   CO2 29 11/21/2022 1342   GLUCOSE 89 11/21/2022 1342   BUN 14 11/21/2022 1342   BUN 19 10/22/2022 0000   CREATININE 0.75 11/21/2022 1342   CALCIUM 8.4 (L) 11/21/2022 1342   PROT 6.3 (L) 11/21/2022 1342   ALBUMIN 3.6 11/21/2022 1342   AST 22 11/21/2022 1342   ALT 24 11/21/2022 1342   ALKPHOS 63 11/21/2022 1342   BILITOT 0.2 (L) 11/21/2022 1342   GFRNONAA >60 11/21/2022 1342   No results found for: "SPEP",  "UPEP"  Lab Results  Component Value Date   WBC 4.2 11/21/2022   NEUTROABS 2.39 11/21/2022   HGB 11.8 (A) 11/21/2022   HCT 36 11/21/2022   MCV 84 11/21/2022   PLT 317 11/21/2022   Component Ref Range & Units 3 wk ago (10/09/22) 2 mo ago (08/29/22) 4 mo ago (06/28/22) 1 yr ago (09/06/21) 1 yr ago (07/20/21)  CA 27.29 0.0 - 38.6 U/mL 53.8 High  57.7 High  CM 50.9 High  CM 106.6 High  CM 75.6 High      Chemistry      Component Value Date/Time   NA 138 11/21/2022 1342   NA 136 (A) 10/22/2022 0000   K 3.6 11/21/2022 1342   CL 101 11/21/2022 1342   CO2 29 11/21/2022 1342   BUN 14 11/21/2022 1342   BUN 19 10/22/2022 0000   CREATININE 0.75 11/21/2022 1342   GLU 110 10/22/2022  0000      Component Value Date/Time   CALCIUM 8.4 (L) 11/21/2022 1342   ALKPHOS 63 11/21/2022 1342   AST 22 11/21/2022 1342   ALT 24 11/21/2022 1342   BILITOT 0.2 (L) 11/21/2022 1342      RADIOGRAPHIC STUDIES:       I,Jasmine M Lassiter,acting as a scribe for Dellia Beckwith, MD.,have documented all relevant documentation on the behalf of Dellia Beckwith, MD,as directed by  Dellia Beckwith, MD while in the presence of Dellia Beckwith, MD.

## 2022-11-01 ENCOUNTER — Other Ambulatory Visit: Payer: Self-pay | Admitting: Pharmacist

## 2022-11-01 ENCOUNTER — Telehealth: Payer: Self-pay

## 2022-11-01 MED FILL — Fosaprepitant Dimeglumine For IV Infusion 150 MG (Base Eq): INTRAVENOUS | Qty: 5 | Status: AC

## 2022-11-01 MED FILL — Dexamethasone Sodium Phosphate Inj 100 MG/10ML: INTRAMUSCULAR | Qty: 1 | Status: AC

## 2022-11-01 NOTE — Telephone Encounter (Signed)
Patient called back aware to increase po potassium from bid to tid, also aware IV potassium will be added to appt tommorow and will add 2 hours to her time in clinic, patient voiced understanding.

## 2022-11-01 NOTE — Telephone Encounter (Signed)
-----   Message from Belva Chimes, LPN sent at [id number]  2:09 PM EDT ----- Regarding: FW: KCL=3.3  ----- Message ----- From: Derwood Kaplan, MD Sent: 11/01/2022   1:48 PM EDT To: Juanetta Beets, RPH; Belva Chimes, LPN Subject: RE: [id number]                                    Yes, can we give 20 meq IV tomorrow, and have her increase po from bid to tid ----- Message ----- From: Juanetta Beets, [facility] Sent: 11/01/2022  10:12 AM EDT To: Derwood Kaplan, MD; Belva Chimes, LPN Subject: [id number]                                        Any IV or changes to PO potassium?

## 2022-11-01 NOTE — Telephone Encounter (Signed)
Left message for patient to call me back, Spoke with husband he will have her call back. They will keep appt for [place] for injection and  get IV potassium also . Also increase po potassium from bid to tid.

## 2022-11-02 ENCOUNTER — Inpatient Hospital Stay: Payer: BC Managed Care – PPO

## 2022-11-02 VITALS — BP 118/74 | HR 90 | Temp 98.1°F | Resp 14 | Ht 59.0 in | Wt 140.8 lb

## 2022-11-02 DIAGNOSIS — Z5111 Encounter for antineoplastic chemotherapy: Secondary | ICD-10-CM | POA: Diagnosis not present

## 2022-11-02 DIAGNOSIS — C50412 Malignant neoplasm of upper-outer quadrant of left female breast: Secondary | ICD-10-CM

## 2022-11-02 DIAGNOSIS — C7951 Secondary malignant neoplasm of bone: Secondary | ICD-10-CM

## 2022-11-02 MED ORDER — ACETAMINOPHEN 325 MG PO TABS: 650.0000 mg | ORAL_TABLET | Freq: Once | ORAL | Status: DC

## 2022-11-02 MED ORDER — FAM-TRASTUZUMAB DERUXTECAN-NXKI CHEMO 100 MG IV SOLR
5.4000 mg/kg | Freq: Once | INTRAVENOUS | Status: AC
  Administered 2022-11-02: 340 mg via INTRAVENOUS
  Filled 2022-11-02: qty 17

## 2022-11-02 MED ORDER — SODIUM CHLORIDE 0.9 % IV SOLN
10.0000 mg | Freq: Once | INTRAVENOUS | Status: AC
  Administered 2022-11-02: 10 mg via INTRAVENOUS
  Filled 2022-11-02: qty 10

## 2022-11-02 MED ORDER — SODIUM CHLORIDE 0.9% FLUSH
10.0000 mL | INTRAVENOUS | Status: DC | PRN
  Administered 2022-11-02: 10 mL

## 2022-11-02 MED ORDER — DIPHENHYDRAMINE HCL 25 MG PO CAPS: 50.0000 mg | ORAL_CAPSULE | Freq: Once | ORAL | Status: DC

## 2022-11-02 MED ORDER — SODIUM CHLORIDE 0.9 % IV SOLN
150.0000 mg | Freq: Once | INTRAVENOUS | Status: AC
  Administered 2022-11-02: 150 mg via INTRAVENOUS
  Filled 2022-11-02: qty 150

## 2022-11-02 MED ORDER — PALONOSETRON HCL INJECTION 0.25 MG/5ML
0.2500 mg | Freq: Once | INTRAVENOUS | Status: AC
  Administered 2022-11-02: 0.25 mg via INTRAVENOUS
  Filled 2022-11-02: qty 5

## 2022-11-02 MED ORDER — POTASSIUM CHLORIDE 10 MEQ/100ML IV SOLN
10.0000 meq | INTRAVENOUS | Status: AC
  Administered 2022-11-02 (×2): 10 meq via INTRAVENOUS
  Filled 2022-11-02 (×2): qty 100

## 2022-11-02 MED ORDER — HEPARIN SOD (PORK) LOCK FLUSH 100 UNIT/ML IV SOLN
500.0000 [IU] | Freq: Once | INTRAVENOUS | Status: AC | PRN
  Administered 2022-11-02: 500 [IU]

## 2022-11-02 MED ORDER — DEXTROSE 5 % IV SOLN: Freq: Once | INTRAVENOUS | Status: AC

## 2022-11-02 NOTE — Patient Instructions (Signed)
Fam-Trastuzumab Deruxtecan Injection What is this medication? FAM-TRASTUZUMAB DERUXTECAN (fam-tras TOOZ eu mab DER ux TEE kan) treats some types of cancer. It works by blocking a protein that causes cancer cells to grow and multiply. This helps to slow or stop the spread of cancer cells. This medicine may be used for other purposes; ask your health care provider or pharmacist if you have questions. COMMON BRAND NAME(S): ENHERTU What should I tell my care team before I take this medication? They need to know if you have any of these conditions: Heart disease Heart failure Infection, especially a viral infection, such as chickenpox, cold sores, or herpes Liver disease Lung or breathing disease, such as asthma or COPD An unusual or allergic reaction to fam-trastuzumab deruxtecan, other medications, foods, dyes, or preservatives Pregnant or trying to get pregnant Breast-feeding How should I use this medication? This medication is injected into a vein. It is given by your care team in a hospital or clinic setting. A special MedGuide will be given to you before each treatment. Be sure to read this information carefully each time. Talk to your care team about the use of this medication in children. Special care may be needed. Overdosage: If you think you have taken too much of this medicine contact a poison control center or emergency room at once. NOTE: This medicine is only for you. Do not share this medicine with others. What if I miss a dose? It is important not to miss your dose. Call your care team if you are unable to keep an appointment. What may interact with this medication? Interactions are not expected. This list may not describe all possible interactions. Give your health care provider a list of all the medicines, herbs, non-prescription drugs, or dietary supplements you use. Also tell them if you smoke, drink alcohol, or use illegal drugs. Some items may interact with your  medicine. What should I watch for while using this medication? Visit your care team for regular checks on your progress. Tell your care team if your symptoms do not start to get better or if they get worse. Your condition will be monitored carefully while you are receiving this medication. Do not become pregnant while taking this medication or for 7 months after stopping it. Women should inform their care team if they wish to become pregnant or think they might be pregnant. Men should not father a child while taking this medication and for 4 months after stopping it. There is potential for serious side effects to an unborn child. Talk to your care team for more information. Do not breast-feed an infant while taking this medication or for 7 months after the last dose. This medication has caused decreased sperm counts in some men. This may make it more difficult to father a child. Talk to your care team if you are concerned about your fertility. This medication may increase your risk to bruise or bleed. Call your care team if you notice any unusual bleeding. Be careful brushing or flossing your teeth or using a toothpick because you may get an infection or bleed more easily. If you have any dental work done, tell your dentist you are receiving this medication. This medication may cause dry eyes and blurred vision. If you wear contact lenses, you may feel some discomfort. Lubricating eye drops may help. See your care team if the problem does not go away or is severe. This medication may increase your risk of getting an infection. Call your care team for   advice if you get a fever, chills, sore throat, or other symptoms of a cold or flu. Do not treat yourself. Try to avoid being around people who are sick. Avoid taking medications that contain aspirin, acetaminophen, ibuprofen, naproxen, or ketoprofen unless instructed by your care team. These medications may hide a fever. What side effects may I notice from  receiving this medication? Side effects that you should report to your care team as soon as possible: Allergic reactions--skin rash, itching, hives, swelling of the face, lips, tongue, or throat Dry cough, shortness of breath or trouble breathing Infection--fever, chills, cough, sore throat, wounds that don't heal, pain or trouble when passing urine, general feeling of discomfort or being unwell Heart failure--shortness of breath, swelling of the ankles, feet, or hands, sudden weight gain, unusual weakness or fatigue Unusual bruising or bleeding Side effects that usually do not require medical attention (report these to your care team if they continue or are bothersome): Constipation Diarrhea Hair loss Muscle pain Nausea Vomiting This list may not describe all possible side effects. Call your doctor for medical advice about side effects. You may report side effects to FDA at 1-800-FDA-1088. Where should I keep my medication? This medication is given in a hospital or clinic. It will not be stored at home. NOTE: This sheet is a summary. It may not cover all possible information. If you have questions about this medicine, talk to your doctor, pharmacist, or health care provider.  2023 [employer]vier/Gold Standard (2021-04-12 00:00:00) 

## 2022-11-04 ENCOUNTER — Encounter: Payer: Self-pay | Admitting: Oncology

## 2022-11-05 ENCOUNTER — Other Ambulatory Visit: Payer: Self-pay | Admitting: Cardiology

## 2022-11-09 ENCOUNTER — Inpatient Hospital Stay: Payer: BC Managed Care – PPO

## 2022-11-09 VITALS — BP 99/70 | HR 102 | Temp 98.4°F | Resp 14 | Ht 59.0 in | Wt 135.0 lb

## 2022-11-09 DIAGNOSIS — Z5111 Encounter for antineoplastic chemotherapy: Secondary | ICD-10-CM | POA: Diagnosis not present

## 2022-11-09 DIAGNOSIS — C7951 Secondary malignant neoplasm of bone: Secondary | ICD-10-CM

## 2022-11-09 MED ORDER — DENOSUMAB 120 MG/1.7ML ~~LOC~~ SOLN
120.0000 mg | Freq: Once | SUBCUTANEOUS | Status: AC
  Administered 2022-11-09: 120 mg via SUBCUTANEOUS
  Filled 2022-11-09: qty 1.7

## 2022-11-09 MED ORDER — FULVESTRANT 250 MG/5ML IM SOSY
500.0000 mg | PREFILLED_SYRINGE | Freq: Once | INTRAMUSCULAR | Status: AC
  Administered 2022-11-09: 250 mg via INTRAMUSCULAR
  Filled 2022-11-09: qty 10

## 2022-11-12 ENCOUNTER — Other Ambulatory Visit: Payer: Self-pay | Admitting: Oncology

## 2022-11-12 DIAGNOSIS — M545 Low back pain, unspecified: Secondary | ICD-10-CM

## 2022-11-12 DIAGNOSIS — C7951 Secondary malignant neoplasm of bone: Secondary | ICD-10-CM

## 2022-11-12 DIAGNOSIS — R0789 Other chest pain: Secondary | ICD-10-CM

## 2022-11-21 ENCOUNTER — Inpatient Hospital Stay: Payer: BC Managed Care – PPO

## 2022-11-21 ENCOUNTER — Encounter: Payer: Self-pay | Admitting: Hematology and Oncology

## 2022-11-21 ENCOUNTER — Inpatient Hospital Stay: Payer: BC Managed Care – PPO | Attending: Hematology and Oncology | Admitting: Hematology and Oncology

## 2022-11-21 VITALS — BP 115/83 | HR 95 | Temp 98.1°F | Resp 18 | Ht 59.0 in | Wt 137.9 lb

## 2022-11-21 DIAGNOSIS — Z170 Estrogen receptor positive status [ER+]: Secondary | ICD-10-CM | POA: Insufficient documentation

## 2022-11-21 DIAGNOSIS — C7951 Secondary malignant neoplasm of bone: Secondary | ICD-10-CM | POA: Diagnosis not present

## 2022-11-21 DIAGNOSIS — M792 Neuralgia and neuritis, unspecified: Secondary | ICD-10-CM | POA: Diagnosis not present

## 2022-11-21 DIAGNOSIS — Z5112 Encounter for antineoplastic immunotherapy: Secondary | ICD-10-CM | POA: Diagnosis present

## 2022-11-21 DIAGNOSIS — Z79811 Long term (current) use of aromatase inhibitors: Secondary | ICD-10-CM | POA: Insufficient documentation

## 2022-11-21 DIAGNOSIS — Z5111 Encounter for antineoplastic chemotherapy: Secondary | ICD-10-CM | POA: Diagnosis not present

## 2022-11-21 DIAGNOSIS — Z780 Asymptomatic menopausal state: Secondary | ICD-10-CM

## 2022-11-21 DIAGNOSIS — C50412 Malignant neoplasm of upper-outer quadrant of left female breast: Secondary | ICD-10-CM | POA: Diagnosis not present

## 2022-11-21 LAB — CMP (CANCER CENTER ONLY)
ALT: 24 U/L (ref 0–44)
AST: 22 U/L (ref 15–41)
Albumin: 3.6 g/dL (ref 3.5–5.0)
Alkaline Phosphatase: 63 U/L (ref 38–126)
Anion gap: 8 (ref 5–15)
BUN: 14 mg/dL (ref 6–20)
CO2: 29 mmol/L (ref 22–32)
Calcium: 8.4 mg/dL — ABNORMAL LOW (ref 8.9–10.3)
Chloride: 101 mmol/L (ref 98–111)
Creatinine: 0.75 mg/dL (ref 0.44–1.00)
GFR, Estimated: >60 mL/min (ref >60)
Glucose, Bld: 89 mg/dL (ref 70–99)
Potassium: 3.6 mmol/L (ref 3.5–5.1)
Sodium: 138 mmol/L (ref 135–145)
Total Bilirubin: 0.2 mg/dL — ABNORMAL LOW (ref 0.3–1.2)
Total Protein: 6.3 g/dL — ABNORMAL LOW (ref 6.5–8.1)

## 2022-11-21 LAB — CBC AND DIFFERENTIAL
Basophils, %: 1
Eosinophils, %: 5
HCT: 36 (ref 36–46)
Hemoglobin: 11.8 — AB (ref 12.0–16.0)
LYMPH%: 24 %
MCV: 84 (ref 81–99)
Monocyte %: 13
NEUT%: 57 %
Neutrophils Absolute: 2.39
Platelets: 317 10*3/uL (ref 150–400)
WBC: 4.2

## 2022-11-21 LAB — CBC: RBC: 4.32 (ref 3.87–5.11)

## 2022-11-21 MED ORDER — LORAZEPAM 1 MG PO TABS: 1.0000 mg | ORAL_TABLET | Freq: Three times a day (TID) | ORAL | 0 refills | Status: DC

## 2022-11-21 MED ORDER — GABAPENTIN 300 MG PO CAPS: 300.0000 mg | ORAL_CAPSULE | Freq: Every day | ORAL | 1 refills | Status: DC

## 2022-11-21 NOTE — Assessment & Plan Note (Signed)
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  She was treated with lumpectomy followed by adjuvant chemotherapy and radiation therapy. She was on adjuvant hormonal therapy with tamoxifen 20 mg daily from October 2015 to May 2020, but due to elevation of the liver transaminases, was switched to anastrazole.  She then developed bone metastasis in November 2022.

## 2022-11-21 NOTE — Assessment & Plan Note (Addendum)
Widespread bone metastases diagnosed in November 2022, with a history of stage IIB hormone receptor positive breast cancer in 2014.  There were multiple marrow replacing lesions within the proximal right femur within the ischium as well as the inferior rami. 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  Pathology revealed positive estrogen receptors.  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 Ki67 was 60%. PET in December revealed dominant finding of intensely hypermetabolic aggressive skeletal metastasis involving the calvarium, sternum, thoracic and lumbar spine, and pelvis, with potential pathologic fracture of the right femur with internal fixation. There was soft tissue extension into the anterior mediastinum associated with the manubrial metastasis. Multiple spinal vertebral body lesions have cortical destruction along the posterior margin.  She started monthly Xgeva in December.  She received palliative radiation therapy to the right hip, left sacrum/SI joint and upper T-spine/sternum completed in February 2023.   She was treated with docetaxel/trastuzumab/pertuzumab for 4 cycles.  Cycle 4 was delayed and dose reduction made by 20% due to left leg infection.  Docetaxel was then discontinued due to delayed wound healing.  She has continued maintenance trastuzumab/pertuzumab, as well as denosumab.  Palliative hormonal therapy had not been started due to elevation of transaminases.  CT chest, abdomen and pelvis in September revealed increasing sclerosis of her bony metastatic disease felt to be likely treatment response.  There was a single lesion along the left iliac which remained lytic and follow-up was recommended.  There was interval development of pathologic fractures at T3, T4, T12.  No signs of solid organ or nodal metastatic disease was seen there was marked elevation of the left hemidiaphragm.  She was referred for consideration of kyphoplasty with  radiofrequency ablation and this was done at T12 and L1.  As the upper thoracic lesions were not extremely symptomatic, the procedure was not recommended in this area.  She developed left hip pain, so saw her orthopedic surgeon. MRI in December revealed a pathologic fracture along the superior acetabulum with numerous metastatic lesions throughout the pelvis and proximal femur.  Surgery was not recommended. She was placed on hormonal therapy with monthly fulvestrant injections in January 2024 and has tolerated these without difficulty. She received palliative radiation to the left hip completed on February 14 with improvement in her pain.  She has not tolerated multiple formulations of long-acting narcotics, so continues immediate release oxycodone 10 mg every 4 hours as needed for pain.    CT chest, abdomen and pelvis in February revealed progressive disease, with signs of nodal disease in the left thoracic inlet and left retropectoral region, as well as worsening bony metastatic disease with worsening areas of lytic changes throughout the many vertebral bodies and pelvis.  There was diffuse sclerosis in the left proximal femur which potentially could be treatment effect as she has recently received radiation to this area, although breast cancer bone metastasis can be sclerotic.  The CA27-29 remained elevated, but was basically stable.    Her HER2 targeted treatment was switched to Enhertu every 3 weeks. We recommended continuing the fulvestrant monthly, as well as denosumab monthly.  She will proceed with a third cycle of Enhertu this week.  She has a follow-up with her cardiologist in May and we plan a echocardiogram by early June. She is scheduled to see a specialist at [facility] on April 17 to further evaluate of the fracture of her left hip.   We will plan to see her back in  3 weeks with a CBC and comprehensive metabolic panel prior to a fourth cycle, after which we will plan to repeat imaging to assess  treatment response.

## 2022-11-21 NOTE — Assessment & Plan Note (Signed)
Worsening neuropathic pain of the left hand.  We have not tried gabapentin for pain, which may improve this, so we will start her on gabapentin 300 mg at bedtime.  If she tolerates this without difficulty, we can titrate up as needed.

## 2022-11-21 NOTE — Progress Notes (Signed)
[facility]  [address] [place],  [place]  [address] [phone]  Clinic Day:  11/21/2022  Referring physician: Galvin Proffer, MD  ASSESSMENT & PLAN:   Assessment & Plan: Malignant neoplasm metastatic to bone [facility]) Widespread bone metastases diagnosed in November 2022, with a history of stage IIB hormone receptor positive breast cancer in 2014.  There were multiple marrow replacing lesions within the proximal right femur within the ischium as well as the inferior rami. 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  Pathology revealed positive estrogen receptors.  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 Ki67 was 60%. PET in December revealed dominant finding of intensely hypermetabolic aggressive skeletal metastasis involving the calvarium, sternum, thoracic and lumbar spine, and pelvis, with potential pathologic fracture of the right femur with internal fixation. There was soft tissue extension into the anterior mediastinum associated with the manubrial metastasis. Multiple spinal vertebral body lesions have cortical destruction along the posterior margin.  She started monthly Xgeva in December.  She received palliative radiation therapy to the right hip, left sacrum/SI joint and upper T-spine/sternum completed in February 2023.   She was treated with docetaxel/trastuzumab/pertuzumab for 4 cycles.  Cycle 4 was delayed and dose reduction made by 20% due to left leg infection.  Docetaxel was then discontinued due to delayed wound healing.  She has continued maintenance trastuzumab/pertuzumab, as well as denosumab.  Palliative hormonal therapy had not been started due to elevation of transaminases.  CT chest, abdomen and pelvis in September revealed increasing sclerosis of her bony metastatic disease felt to be likely treatment response.  There was a single lesion along the left iliac which remained lytic and follow-up  was recommended.  There was interval development of pathologic fractures at T3, T4, T12.  No signs of solid organ or nodal metastatic disease was seen there was marked elevation of the left hemidiaphragm.  She was referred for consideration of kyphoplasty with radiofrequency ablation and this was done at T12 and L1.  As the upper thoracic lesions were not extremely symptomatic, the procedure was not recommended in this area.  She developed left hip pain, so saw her orthopedic surgeon. MRI in December revealed a pathologic fracture along the superior acetabulum with numerous metastatic lesions throughout the pelvis and proximal femur.  Surgery was not recommended. She was placed on hormonal therapy with monthly fulvestrant injections in January 2024 and has tolerated these without difficulty. She received palliative radiation to the left hip completed on February 14 with improvement in her pain.  She has not tolerated multiple formulations of long-acting narcotics, so continues immediate release oxycodone 10 mg every 4 hours as needed for pain.    CT chest, abdomen and pelvis in February revealed progressive disease, with signs of nodal disease in the left thoracic inlet and left retropectoral region, as well as worsening bony metastatic disease with worsening areas of lytic changes throughout the many vertebral bodies and pelvis.  There was diffuse sclerosis in the left proximal femur which potentially could be treatment effect as she has recently received radiation to this area, although breast cancer bone metastasis can be sclerotic.  The CA27-29 remained elevated, but was basically stable.    Her HER2 targeted treatment was switched to Enhertu every 3 weeks. We recommended continuing the fulvestrant monthly, as well as denosumab monthly.  She will proceed with a third cycle of Enhertu this week.  She has a follow-up with  her cardiologist in May and we plan a echocardiogram by early June. She is scheduled  to see a specialist at [facility] on April 17 to further evaluate of the fracture of her left hip.   We will plan to see her back in 3 weeks with a CBC and comprehensive metabolic panel prior to a fourth cycle, after which we will plan to repeat imaging to assess treatment response.  Neuropathic pain of hand, left Worsening neuropathic pain of the left hand.  We have not tried gabapentin for pain, which may improve this, so we will start her on gabapentin 300 mg at bedtime.  If she tolerates this without difficulty, we can titrate up as needed.  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  She was treated with lumpectomy followed by adjuvant chemotherapy and radiation therapy. She was on adjuvant hormonal therapy with tamoxifen 20 mg daily from October 2015 to May 2020, but due to elevation of the liver transaminases, was switched to anastrazole.  She then developed bone metastasis in November 2022.    The patient and her husband understands the plan discussed today and are in agreement with them.  They knows to contact our office if she develops concerns prior to her next appointment.   I provided 15 minutes of face-to-face time during this encounter and > 50% was spent counseling as documented under my assessment and plan.    Adah Perl, PA-C  [facility] AT [facility] [address] [place] [address] Dept: [phone] Dept Fax: [phone]   Orders Placed This Encounter  Procedures   CBC and differential    This external order was created through the Results Console.   CBC    This external order was created through the Results Console.      CHIEF COMPLAINT:  CC: Metastatic breast cancer to bone  Current Treatment: Trastuzumab/pertuzumab/fulvestrant/denosumab  HISTORY OF PRESENT ILLNESS:  Anna [name] is a 52 y.o. female originally diagnosed with stage IIB (T2 N1a M0) hormone receptor positive left breast cancer in December 2014.  She was treated with lumpectomy.  Pathology revealed a 2.1 cm, grade 3, invasive ductal carcinoma with 1 lymph node positive for metastasis.  Lymphovascular invasion was seen.  Estrogen and progesterone receptors were positive and her 2 Neu negative.  Ki 67 was 76%.  She received adjuvant chemotherapy with 4 cycles of doxorubicin and cyclophosphamide, followed by 12 weeks of weekly paclitaxel.  She then received adjuvant radiation to the left breast.  She was placed on tamoxifen 20 mg daily in October 2015.  She underwent testing for hereditary cancer syndromes with [employer] myRisk Hereditary Cancer panel, which did not reveal any clinically significant mutation or variants of uncertain significance, so no change in management was recommended.  She was found to have fatty infiltration of the liver on a CTA chest in June 2015.  Bone density scan in November 2017 was normal.  Bilateral diagnostic mammogram in November 2018 did not reveal any evidence of malignancy.   When she was seen in May 2019, she had mild elevation of the AST, which was felt to likely be due to medication effects. She had a bilateral diagnostic mammogram in January 2020.  At that time, she reported a lump in the left breast, so an ultrasound was also done.  Bilateral mammogram and left breast ultrasound did not  reveal any suspicious findings.  Postsurgical scarring was seen in the left breast.  The area of palpable abnormality in the left breast was overlying the postsurgical scarring.  She was rescheduled in our office in May 2020 and found to have significant increase in her liver transaminases.  Tamoxifen was discontinued.  She was instructed to avoid alcohol and Tylenol.  CT abdomen and pelvis revealed severe hepatic steatosis.  Bone density scan in June 2020 revealed mild osteopenia with a T-score -1.4  in the spine.  Her last menstruation was in January 2015 and labs confirmed her postmenopausal status.  She was placed on anastrozole 1 mg daily in June 2020.  She was also referred to hepatology and saw Annamarie Major, NP, who diagnosed the patient with nonalcoholic fatty liver disease.  She was unsure if this was secondary to tamoxifen, but felt it was unlikely.  Liver biopsy in July 2020 confirmed severe steatosis of 75-80% with moderate ballooning degeneration.  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  The patient has had improvement in the liver transaminases. Abdominal ultrasound per Dr. Elsie Stain which revealed an enlarging exophytic cyst of the right kidney.  This has been appreciated for many years, on PET imaging from 2014 this measured 10.6 cm, CT in May 2020 this was 12.4 cm, and recent abdominal ultrasound from July measured this at 14.5 cm. MRI done in November confirmed a benign cyst of the right kidney.  She continues to see Dr. Elsie Stain every 6 months.  She has also been seen by dermatology at [facility], with a good report.    She began to have right hip pain and functional disability with rapid worsening. MRI right hip in November 2022 revealed multiple marrow replacing lesions within the proximal femur within the ischium as well as the inferior rami. These findings were most consistent with metastatic disease and impending fracture. The primary and largest focus nearly fills the entire medullary space at the femoral neck with endosteal thinning and irregularity, most severely over the anterior cortex. She underwent right hip arthroplasty in November with Dr. Durene Romans. Surgical pathology confirmed metastatic carcinoma, consistent with patient's clinical history of primary breast carcinoma.   Prognostic profile confirmed estrogen receptor was positive at 95%, and progesterone receptor was positive at 40%. HER2 was positive 3+, which was negative  on her original cancer. PET in December 2022 revealed dominant finding of intensely hypermetabolic aggressive skeletal metastasis involving the calvarium, sternum, thoracic and lumbar spine, and pelvis. There is potential pathologic fracture of the right femur with internal fixation, and soft tissue extension into the anterior mediastinum associated with the manubrial metastasis. Multiple spinal vertebral body lesions have cortical destruction along the posterior margin. Baseline ECHO revealed normal left ventricular size and function with an ejection fraction of 55-60%. Bone density from December revealed mild osteopenia of the AP spine with a T-score of -1.2.  She was initially treated with docetaxel/trastuzumab/pertuzumab 4 cycles.  Due to left leg staphylococcal infection, cycle 4 was delayed and docetaxel dose was reduced by 20%.  Docetaxel was then discontinued due to difficulty with wound healing.  She has continued trastuzumab/pertuzumab/denosumab.  Most recent echocardiogram in December was stable with an ejection fraction of 55 to 60%.  She had elevation of the transaminases during chemotherapy, so we did not restart therapy.  The transaminases normalized in December.  She was started on monthly fulvestrant injections in January.   Unfortunately CT imaging in  February revealed progressive disease within the lymph nodes and bone.  We therefore switched the HER2 targeted therapy to Enhertu every 3 weeks.  We continued fulvestrant and denosumab monthly.   Oncology History  Malignant neoplasm of upper-outer quadrant of left female breast  07/23/2013 Initial Diagnosis   Malignant neoplasm of upper-outer quadrant of left female breast (HCC)   07/23/2013 Cancer Staging   Staging form: Breast, AJCC 7th Edition - Clinical stage from 07/23/2013: Stage IIB (T2, N1, M0) - Signed by Dellia Beckwith, MD on 07/04/2020 Prognostic indicators: Pos LVI   09/22/2021 - 04/06/2022 Chemotherapy   Patient is  on Treatment Plan : BREAST  Trastuzumab + Pertuzumab q21d      09/22/2021 - 09/21/2022 Chemotherapy   Patient is on Treatment Plan : BREAST Trastuzumab + Pertuzumab q21d     10/12/2022 -  Chemotherapy   Patient is on Treatment Plan : BREAST METASTATIC Fam-Trastuzumab Deruxtecan-nxki (Enhertu) (5.4) q21d     Malignant neoplasm metastatic to bone  06/28/2021 Initial Diagnosis   Bone metastases (HCC)   09/22/2021 - 04/06/2022 Chemotherapy   Patient is on Treatment Plan : BREAST  Trastuzumab + Pertuzumab q21d      09/22/2021 - 09/21/2022 Chemotherapy   Patient is on Treatment Plan : BREAST Trastuzumab + Pertuzumab q21d     05/11/2022 Imaging   CT chest, abdomen and pelvis:  IMPRESSION:  1. Widespread bony metastatic disease shows increased sclerosis  since previous imaging. This is likely related to response to  therapy in the interval. There is a single lesion along the LEFT  iliac which shows continued lytic change and warrants attention on  follow-up  2. Interval development of pathologic fractures at in the upper  thoracic spine and at the thoracolumbar junction with associated  kyphosis.  3. No signs of solid organ or nodal metastatic disease.  4. Marked elevation of the LEFT hemidiaphragm as on the prior PET.  5. Aortic atherosclerosis.    10/12/2022 -  Chemotherapy   Patient is on Treatment Plan : BREAST METASTATIC Fam-Trastuzumab Deruxtecan-nxki (Enhertu) (5.4) q21d         INTERVAL HISTORY:  Hampton is here today for repeat clinical assessment prior to a third cycle of Enhertu.  She reports difficulty with nausea, vomiting and diarrhea for several days after treatment.  Medications have been effective, but she still had vomiting.  I recommended she try her lorazepam for the nausea and vomiting if needed.  She reports shortness of breath last week, which has improved.  She denies chest pain.  She reports stable painful neuropathy of the left hand, which she rates 4-5 out of 10.  She  has persistent low back pain.  She states her back pain is fairly well-controlled with oxycodone 10 mg every 4 hours as needed, but this does not seem to help her neuropathic pain.  She started fulvestrant in January and has tolerated these injections without difficulty.  She continues to have rash and redness of the bilateral lower extremities with lesions in various stages of healing.  Her husband accompanies her today and states that her legs are looking better at this time.  She denies fevers or chills.  Her appetite is good. Her weight has been stable.  She is taking calcium Gummies.  She requests refill her lorazepam today.  REVIEW OF SYSTEMS:  Review of Systems  Constitutional:  Positive for fatigue. Negative for appetite change, chills, fever and unexpected weight change.  HENT:   Negative for lump/mass, mouth  sores and sore throat.   Respiratory:  Positive for shortness of breath. Negative for cough.   Cardiovascular:  Negative for chest pain and leg swelling.  Gastrointestinal:  Positive for diarrhea, nausea and vomiting. Negative for abdominal pain and constipation.  Endocrine: Negative for hot flashes.  Genitourinary:  Negative for difficulty urinating, dysuria, frequency and hematuria.   Musculoskeletal:  Positive for back pain. Negative for arthralgias and myalgias.  Skin:  Negative for rash.  Neurological:  Negative for dizziness and headaches.  Hematological:  Negative for adenopathy. Does not bruise/bleed easily.  Psychiatric/Behavioral:  Negative for depression and sleep disturbance. The patient is nervous/anxious.      VITALS:  Blood pressure 115/83, pulse 95, temperature 98.1 F (36.7 C), temperature source Oral, resp. rate 18, height 4\' 11"  (1.499 m), weight 137 lb 14.4 oz (62.6 kg), last menstrual period 08/18/2013, SpO2 96 %.  Wt Readings from Last 3 Encounters:  11/21/22 137 lb 14.4 oz (62.6 kg)  11/09/22 135 lb (61.2 kg)  11/02/22 140 lb 12.8 oz (63.9 kg)    Body  mass index is 27.85 kg/m.  Performance status (ECOG): 2 - Symptomatic, <50% confined to bed  PHYSICAL EXAM:  Physical Exam Vitals and nursing note reviewed.  Constitutional:      General: She is not in acute distress.    Appearance: Normal appearance.  HENT:     Head: Normocephalic and atraumatic.     Mouth/Throat:     Mouth: Mucous membranes are moist.     Pharynx: Oropharynx is clear. No oropharyngeal exudate or posterior oropharyngeal erythema.  Eyes:     General: No scleral icterus.    Extraocular Movements: Extraocular movements intact.     Conjunctiva/sclera: Conjunctivae normal.     Pupils: Pupils are equal, round, and reactive to light.  Cardiovascular:     Rate and Rhythm: Normal rate and regular rhythm.     Heart sounds: Normal heart sounds. No murmur heard.    No friction rub. No gallop.  Pulmonary:     Effort: Pulmonary effort is normal.     Breath sounds: Normal breath sounds. No wheezing, rhonchi or rales.  Abdominal:     General: There is no distension.     Palpations: Abdomen is soft. There is no hepatomegaly, splenomegaly or mass.     Tenderness: There is no abdominal tenderness.  Musculoskeletal:     Cervical back: No tenderness. Decreased range of motion.     Right lower leg: No edema.     Left lower leg: No edema.  Lymphadenopathy:     Cervical: No cervical adenopathy.     Upper Body:     Right upper body: No supraclavicular or axillary adenopathy.     Left upper body: No supraclavicular or axillary adenopathy.     Lower Body: No right inguinal adenopathy. No left inguinal adenopathy.  Skin:    General: Skin is warm and dry.     Coloration: Skin is not jaundiced.     Findings: Erythema, lesion and rash present.     Comments: Erythema and rash with skin lesions in various stages of healing of the bilateral legs.  There is a similar lesion of the umbilicus with overlying eschar.  There are a few similar small lesions of the chin.  Neurological:      Mental Status: She is alert and oriented to person, place, and time.     Cranial Nerves: No cranial nerve deficit.  Psychiatric:  Mood and Affect: Mood normal.        Behavior: Behavior normal.        Thought Content: Thought content normal.    LABS:      Latest Ref Rng & Units 11/21/2022   12:00 AM 10/31/2022   12:25 PM 10/22/2022   12:00 AM  CBC  WBC  4.2     4.1  5.6      Hemoglobin 12.0 - 16.0 11.8     11.6  11.6      Hematocrit 36 - 46 36     38.4  36      Platelets 150 - 400 K/uL 317     292  237         This result is from an external source.      Latest Ref Rng & Units 10/31/2022   12:25 PM 10/22/2022   12:00 AM 10/09/2022    9:19 AM  CMP  Glucose 70 - 99 mg/dL 83   161   BUN 6 - 20 mg/dL 10  19     16    Creatinine 0.44 - 1.00 mg/dL 0.96  0.5     0.45   Sodium 135 - 145 mmol/L 141  136     139   Potassium 3.5 - 5.1 mmol/L 3.3  4.0     4.2   Chloride 98 - 111 mmol/L 102  102     104   CO2 22 - 32 mmol/L 31  32     29   Calcium 8.9 - 10.3 mg/dL 8.9  8.3     8.8   Total Protein 6.5 - 8.1 g/dL 6.5   6.9   Total Bilirubin 0.3 - 1.2 mg/dL 0.2   0.4   Alkaline Phos 38 - 126 U/L 68  74     89   AST 15 - 41 U/L 19  50     23   ALT 0 - 44 U/L 21  26     22       This result is from an external source.     Lab Results  Component Value Date   CEA1 2.2 07/20/2021   /  CEA  Date Value Ref Range Status  07/20/2021 2.2 0.0 - 4.7 ng/mL Final    Comment:    (NOTE)                             Nonsmokers          <3.9                             Smokers             <5.6 [employer] Electrochemiluminescence Immunoassay (ECLIA) Values obtained with different assay methods or kits cannot be used interchangeably.  Results cannot be interpreted as absolute evidence of the presence or absence of malignant disease. Performed At: [facility] [address] [place], [place] [id number] Jolene Schimke MD NW:[id number]    No results found for: "PSA1" No  results found for: "CAN199" No results found for: "CAN125"  No results found for: "TOTALPROTELP", "ALBUMINELP", "A1GS", "A2GS", "BETS", "BETA2SER", "GAMS", "MSPIKE", "SPEI" Lab Results  Component Value Date   TIBC 287 04/24/2022   TIBC 279 10/13/2021   FERRITIN 119 04/24/2022   FERRITIN 206 10/13/2021   IRONPCTSAT 9 (L) 04/24/2022   IRONPCTSAT  13 10/13/2021   No results found for: "LDH"  STUDIES:     Exam(s): [id number] CT/CT CHEST-ABD-PELV W/IV CM  CLINICAL DATA: History of LEFT breast cancer, follow-up evaluation.  * Tracking Code: BO *  EXAM:  CT CHEST, ABDOMEN, AND PELVIS WITH CONTRAST   TECHNIQUE:  Multidetector CT imaging of the chest, abdomen and pelvis was  performed following the standard protocol during bolus  administration of intravenous contrast.   RADIATION DOSE REDUCTION: This exam was performed according to the  departmental dose-optimization program which includes automated  exposure control, adjustment of the mA and/or kV according to  patient size and/or use of iterative reconstruction technique.   CONTRAST: 100 cc Isovue 370   COMPARISON: CT of the chest, abdomen and pelvis performed on  May 10, 2022.   FINDINGS:  CT CHEST FINDINGS  Cardiovascular: RIGHT-sided Port-A-Cath terminates at the caval to  atrial junction. Normal heart size without pericardial effusion.  Normal caliber of central pulmonary vessels. LEFT hilum distorted by  elevated LEFT hemidiaphragm.  Mediastinum/Nodes:  Soft tissue around vascular structures at the LEFT thoracic inlet.  LEFT supraclavicular lymph node at 10 mm similar short axis  dimension perhaps even slightly smaller than on previous imaging.  Soft tissue about LEFT axillary vasculature on image 15/2 measuring  12-13 mm contiguous with ill-defined soft tissue tracking along  vessels in this location suspicious for additional site of  metastatic nodal disease. Somewhat masked by surrounding ill-defined  soft  tissue density in this location. No RIGHT thoracic inlet  abnormalities. No internal mammary lymphadenopathy. No thoracic  inlet adenopathy elsewhere or mediastinal or hilar lymphadenopathy.  Lungs/Pleura: Juxta diaphragmatic atelectasis on the LEFT. Similar  to prior study. No pleural effusion. Large airways are patent.  Musculoskeletal: Stranding over the LEFT breast. Small nodule in  LEFT breast measuring 12 mm previously 13 mm.   CT ABDOMEN PELVIS FINDINGS  Hepatobiliary: No focal, suspicious hepatic lesion, pericholecystic  stranding or biliary duct dilation. Portal vein is patent.  Pancreas: Normal, without mass, inflammation or ductal dilatation.  Spleen: Normal.  Adrenals/Urinary Tract: Large benign-appearing Bosniak category II  renal cyst for which no additional dedicated follow-up imaging is  recommended noted arising from the inferior pole of the RIGHT  kidney. No hydronephrosis. No perinephric stranding. Urinary bladder  obscured by streak artifact from LEFT hip arthroplasty. LEFT kidney  is unremarkable. No adrenal abnormalities are seen.  Stomach/Bowel: Stomach distended mildly beneath the markedly  elevated LEFT hemidiaphragm. No signs of bowel obstruction. No signs  of acute bowel process. Appendix not visible but no secondary signs  to suggest acute appendiceal process.  Vascular/Lymphatic:  Aortic atherosclerosis. No sign of aneurysm. Smooth contour of the  IVC. There is no gastrohepatic or hepatoduodenal ligament  lymphadenopathy. No retroperitoneal or mesenteric lymphadenopathy.  No pelvic sidewall lymphadenopathy.  Reproductive: Unremarkable by CT, not well assessed due to streak  artifact in the pelvis.  Other: No ascites.  Musculoskeletal: Post cement augmentation at T12-L1. Some increase  in areas of lytic change since previous imaging but still with  extensive bony metastases.  T3-T4 compression fractures with similar appearance. Increasing and  new lytic  change at T7. Increasing lytic change at T8 along the  anterior vertebral body compared to the prior imaging study.  Similar appearance of inferior endplate fracture at T9 and  sclerosis. Similar appearance of kyphotic deformity associated with  compression fractures. Increasingly sclerotic and lytic process  involving posterior vertebral body at L2 compared to previous  imaging.  L3 also with more visible areas of sclerosis. Lytic and  sclerotic changes throughout the remainder of the spine with similar appearance.  Areas in the pelvis also with increasingly lytic changes, for  instance along the RIGHT iliac crest on image 61/602 with further  lytic process destroying inter cortex and signs of previous  sclerosis.  Diffuse sternal involvement as on previous imaging.  Diffuse sclerosis of the LEFT femoral neck and inter trochanteric  LEFT femur.   IMPRESSION:  1. Signs of nodal disease in the LEFT thoracic inlet and LEFT  retropectoral region axilla. Ill-defined soft tissue about this area  may reflect infiltrative disease or post treatment changes best  illustrated on image 135/601 tracking along vascular structures in  the LEFT thoracic inlet.  2. Small nodule in LEFT breast measuring 12 mm previously 13 mm.  3. Worsening of bony metastatic disease post treatment of T12-L1  compression fractures and with worsening of areas of lytic change  throughout many of the vertebral bodies in the pelvis.  4. Note that the LEFT proximal femur shows diffuse sclerosis of the  femoral neck and intertrochanteric femur.  5. No signs of solid organ metastatic disease in the abdomen or  pelvis.  6. Aortic atherosclerosis.  7. Markedly elevated LEFT hemidiaphragm. 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   Migraine 11/29/2020   Mild intermittent asthma 11/29/2020   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   Mixed hyperlipidemia 11/29/2020   Obesity due to excess calories    Osteopenia after menopause 07/04/2020   Other long term (current) drug therapy 11/29/2020   Other vitamin B12 deficiency anemias 11/29/2020   Personal history of malignant neoplasm of breast 11/29/2020   Pre-diabetes    Renal cyst, acquired, right 07/04/2020   14.5 cm in October 2021   Retrocalcaneal bursitis (back of heel), left 04/22/2018   Seasonal allergic rhinitis 11/29/2020   Tightness of heel cord, left 04/22/2018   Type 2 diabetes mellitus without complications 11/29/2020   Vitamin D deficiency 11/29/2020    Past Surgical History:  Procedure Laterality Date   CESAREAN SECTION     IR BONE TUMOR(S)RF ABLATION  05/29/2022   IR BONE TUMOR(S)RF ABLATION  05/29/2022   IR KYPHO EA ADDL LEVEL THORACIC OR LUMBAR  05/29/2022   IR KYPHO THORACIC WITH BONE BIOPSY  05/29/2022   IR RADIOLOGIST EVAL & MGMT  05/22/2022   IR RADIOLOGIST EVAL & MGMT  06/12/2022   LIVER BIOPSY  01/2019   lumpectomy  2014   Left Breast   PORT-A-CATH REMOVAL     PORTACATH PLACEMENT  08/28/2013   STOMACH SURGERY     Tummy Tuck   TOTAL HIP ARTHROPLASTY  Right 07/04/2021   Procedure: TOTAL HIP ARTHROPLASTY;  Surgeon: Durene Romanslin, Matthew, MD;  Location: [facility];  Service: Orthopedics;   Laterality: Right;  90   WISDOM TOOTH EXTRACTION      Family History  Problem Relation Age of Onset   Hyperlipidemia Mother    Diabetes Father    Stroke Father    Colon cancer Neg Hx    Colon polyps Neg Hx    Esophageal cancer Neg Hx    Rectal cancer Neg Hx    Stomach cancer Neg Hx     Social History:  reports that she quit smoking about 20 years ago. Her smoking use included cigarettes. She has never used smokeless tobacco. She reports current alcohol use. She reports that she does not use drugs.The patient is accompanied by her husband today.  Allergies: No Known Allergies  Current Medications: Current Outpatient Medications  Medication Sig Dispense Refill   gabapentin (NEURONTIN) 300 MG capsule Take 1 capsule (300 mg total) by mouth at bedtime. 30 capsule 1   albuterol (VENTOLIN HFA) 108 (90 Base) MCG/ACT inhaler Inhale 2 puffs into the lungs every 6 (six) hours as needed for shortness of breath.     ARIPiprazole (ABILIFY) 5 MG tablet Take 2.5 mg by mouth daily.     aspirin 81 MG EC tablet Take 81 mg by mouth daily. Swallow whole.     atorvastatin (LIPITOR) 20 MG tablet Take 1 tablet (20 mg total) by mouth daily. 90 tablet 0   augmented betamethasone dipropionate (DIPROLENE-AF) 0.05 % cream      calcium carbonate (OS-CAL) 1250 (500 Ca) MG chewable tablet Chew 6 tablets by mouth daily.     CLENPIQ 10-3.5-12 MG-GM -GM/160ML SOLN      desvenlafaxine (PRISTIQ) 50 MG 24 hr tablet Take 50 mg by mouth daily.     diphenoxylate-atropine (LOMOTIL) 2.5-0.025 MG tablet TAKE TWO TABLETS BY MOUTH FOUR TIMES A DAY AS NEEDED FOR DIARRHEA OR LOOSE STOOLS 100 tablet 1   FARXIGA 10 MG TABS tablet Take 1 tablet (10 mg total) by mouth daily. 30 tablet 11   furosemide (LASIX) 20 MG tablet Take 1 tablet (20 mg total) by mouth daily. (Patient taking differently: Take 20 mg by mouth as needed.) 30 tablet 0   labetalol (NORMODYNE) 200 MG tablet Take 1 tablet (200 mg total) by mouth 2 (two) times daily.  180 tablet 3   Loratadine 10 MG CAPS Take 1 capsule (10 mg total) by mouth daily. 30 capsule 6   LORazepam (ATIVAN) 1 MG tablet Take 1 tablet (1 mg total) by mouth every 8 (eight) hours. 30 tablet 0   magic mouthwash (lidocaine, diphenhydrAMINE, alum & mag hydroxide) suspension Take 5 mLs by mouth every 3 (three) hours as needed. Swish and spit; for throat/mouth pain     naloxone (NARCAN) nasal spray 4 mg/0.1 mL One spray in nostril as needed, may repeat every 2 to 3 minutes until medical assistance becomes available 4 each 1   nitroGLYCERIN (NITROSTAT) 0.4 MG SL tablet Place 0.4 mg under the tongue every 5 (five) minutes as needed for chest pain.     nystatin (MYCOSTATIN/NYSTOP) powder Apply 1 Application topically 2 (two) times daily.     ondansetron (ZOFRAN) 4 MG tablet Take 1 tablet (4 mg total) by mouth every 4 (four) hours as needed for nausea. 90 tablet 3   Oxycodone HCl 10 MG TABS TAKE ONE TABLET BY MOUTH EVERY 4 HOURS AS NEEDED FOR PAIN 180 tablet 0  OZEMPIC, 0.25 OR 0.5 MG/DOSE, 2 MG/3ML SOPN Inject into the skin.     potassium chloride SA (KLOR-CON M) 20 MEQ tablet Take 1 tablet (20 mEq total) by mouth 2 (two) times daily. 60 tablet 0   prochlorperazine (COMPAZINE) 10 MG tablet Take 1 tablet (10 mg total) by mouth every 6 (six) hours as needed for nausea or vomiting. 90 tablet 3   valsartan (DIOVAN) 40 MG tablet TAKE ONE TABLET BY MOUTH EVERY DAY 30 tablet 2   Current Facility-Administered Medications  Medication Dose Route Frequency Provider Last Rate Last Admin   0.9 %  sodium chloride infusion  500 mL Intravenous Once Lynann Bologna, MD       technetium sestamibi generic (CARDIOLITE) injection 10.5 millicurie  10.5 millicurie Intravenous Once PRN Revankar, Aundra Dubin, MD

## 2022-11-22 ENCOUNTER — Encounter: Payer: Self-pay | Admitting: Oncology

## 2022-11-22 ENCOUNTER — Other Ambulatory Visit: Payer: Self-pay

## 2022-11-22 ENCOUNTER — Telehealth: Payer: Self-pay

## 2022-11-22 ENCOUNTER — Encounter: Payer: Self-pay | Admitting: Hematology and Oncology

## 2022-11-22 LAB — CANCER ANTIGEN 27.29: CA 27.29: 46.2 U/mL — ABNORMAL HIGH (ref 0.0–38.6)

## 2022-11-22 MED FILL — Dexamethasone Sodium Phosphate Inj 100 MG/10ML: INTRAMUSCULAR | Qty: 1 | Status: AC

## 2022-11-22 MED FILL — Fosaprepitant Dimeglumine For IV Infusion 150 MG (Base Eq): INTRAVENOUS | Qty: 5 | Status: AC

## 2022-11-22 NOTE — Telephone Encounter (Signed)
-----   Message from Adah Perl, PA-C sent at 11/22/2022  8:31 AM EDT ----- Please let her know her calcium is low. If she is taking calcium regularly, increase by another dose a day, otherwise be sure to take regularly. Also, her cancer marker has decreased again, down to 46 from 54. Thanks

## 2022-11-22 NOTE — Telephone Encounter (Signed)
Patient notified of lab results ,she will increase the calcium dose daily

## 2022-11-23 ENCOUNTER — Inpatient Hospital Stay: Payer: BC Managed Care – PPO

## 2022-11-23 ENCOUNTER — Encounter: Payer: Self-pay | Admitting: Oncology

## 2022-11-23 VITALS — BP 119/68 | HR 88 | Temp 99.0°F | Resp 14 | Ht 59.0 in | Wt 136.1 lb

## 2022-11-23 DIAGNOSIS — Z5111 Encounter for antineoplastic chemotherapy: Secondary | ICD-10-CM | POA: Diagnosis not present

## 2022-11-23 DIAGNOSIS — Z170 Estrogen receptor positive status [ER+]: Secondary | ICD-10-CM

## 2022-11-23 DIAGNOSIS — C50412 Malignant neoplasm of upper-outer quadrant of left female breast: Secondary | ICD-10-CM

## 2022-11-23 DIAGNOSIS — C7951 Secondary malignant neoplasm of bone: Secondary | ICD-10-CM

## 2022-11-23 MED ORDER — SODIUM CHLORIDE 0.9% FLUSH
10.0000 mL | INTRAVENOUS | Status: DC | PRN
  Administered 2022-11-23: 10 mL

## 2022-11-23 MED ORDER — FAM-TRASTUZUMAB DERUXTECAN-NXKI CHEMO 100 MG IV SOLR
5.4000 mg/kg | Freq: Once | INTRAVENOUS | Status: AC
  Administered 2022-11-23: 340 mg via INTRAVENOUS
  Filled 2022-11-23: qty 17

## 2022-11-23 MED ORDER — DIPHENHYDRAMINE HCL 25 MG PO CAPS: 50.0000 mg | ORAL_CAPSULE | Freq: Once | ORAL | Status: DC

## 2022-11-23 MED ORDER — SODIUM CHLORIDE 0.9 % IV SOLN
10.0000 mg | Freq: Once | INTRAVENOUS | Status: AC
  Administered 2022-11-23: 10 mg via INTRAVENOUS
  Filled 2022-11-23: qty 10

## 2022-11-23 MED ORDER — HEPARIN SOD (PORK) LOCK FLUSH 100 UNIT/ML IV SOLN
500.0000 [IU] | Freq: Once | INTRAVENOUS | Status: AC | PRN
  Administered 2022-11-23: 500 [IU]

## 2022-11-23 MED ORDER — DEXTROSE 5 % IV SOLN: Freq: Once | INTRAVENOUS | Status: AC

## 2022-11-23 MED ORDER — PALONOSETRON HCL INJECTION 0.25 MG/5ML
0.2500 mg | Freq: Once | INTRAVENOUS | Status: AC
  Administered 2022-11-23: 0.25 mg via INTRAVENOUS
  Filled 2022-11-23: qty 5

## 2022-11-23 MED ORDER — SODIUM CHLORIDE 0.9 % IV SOLN
150.0000 mg | Freq: Once | INTRAVENOUS | Status: AC
  Administered 2022-11-23: 150 mg via INTRAVENOUS
  Filled 2022-11-23: qty 150

## 2022-11-23 MED ORDER — ACETAMINOPHEN 325 MG PO TABS: 650.0000 mg | ORAL_TABLET | Freq: Once | ORAL | Status: DC

## 2022-11-23 NOTE — Patient Instructions (Signed)
Fam-Trastuzumab Deruxtecan Injection What is this medication? FAM-TRASTUZUMAB DERUXTECAN (fam-tras TOOZ eu mab DER ux TEE kan) treats some types of cancer. It works by blocking a protein that causes cancer cells to grow and multiply. This helps to slow or stop the spread of cancer cells. This medicine may be used for other purposes; ask your health care provider or pharmacist if you have questions. COMMON BRAND NAME(S): ENHERTU What should I tell my care team before I take this medication? They need to know if you have any of these conditions: Heart disease Heart failure Infection, especially a viral infection, such as chickenpox, cold sores, or herpes Liver disease Lung or breathing disease, such as asthma or COPD An unusual or allergic reaction to fam-trastuzumab deruxtecan, other medications, foods, dyes, or preservatives Pregnant or trying to get pregnant Breast-feeding How should I use this medication? This medication is injected into a vein. It is given by your care team in a hospital or clinic setting. A special MedGuide will be given to you before each treatment. Be sure to read this information carefully each time. Talk to your care team about the use of this medication in children. Special care may be needed. Overdosage: If you think you have taken too much of this medicine contact a poison control center or emergency room at once. NOTE: This medicine is only for you. Do not share this medicine with others. What if I miss a dose? It is important not to miss your dose. Call your care team if you are unable to keep an appointment. What may interact with this medication? Interactions are not expected. This list may not describe all possible interactions. Give your health care provider a list of all the medicines, herbs, non-prescription drugs, or dietary supplements you use. Also tell them if you smoke, drink alcohol, or use illegal drugs. Some items may interact with your  medicine. What should I watch for while using this medication? Visit your care team for regular checks on your progress. Tell your care team if your symptoms do not start to get better or if they get worse. Your condition will be monitored carefully while you are receiving this medication. Do not become pregnant while taking this medication or for 7 months after stopping it. Women should inform their care team if they wish to become pregnant or think they might be pregnant. Men should not father a child while taking this medication and for 4 months after stopping it. There is potential for serious side effects to an unborn child. Talk to your care team for more information. Do not breast-feed an infant while taking this medication or for 7 months after the last dose. This medication has caused decreased sperm counts in some men. This may make it more difficult to father a child. Talk to your care team if you are concerned about your fertility. This medication may increase your risk to bruise or bleed. Call your care team if you notice any unusual bleeding. Be careful brushing or flossing your teeth or using a toothpick because you may get an infection or bleed more easily. If you have any dental work done, tell your dentist you are receiving this medication. This medication may cause dry eyes and blurred vision. If you wear contact lenses, you may feel some discomfort. Lubricating eye drops may help. See your care team if the problem does not go away or is severe. This medication may increase your risk of getting an infection. Call your care team for   advice if you get a fever, chills, sore throat, or other symptoms of a cold or flu. Do not treat yourself. Try to avoid being around people who are sick. Avoid taking medications that contain aspirin, acetaminophen, ibuprofen, naproxen, or ketoprofen unless instructed by your care team. These medications may hide a fever. What side effects may I notice from  receiving this medication? Side effects that you should report to your care team as soon as possible: Allergic reactions--skin rash, itching, hives, swelling of the face, lips, tongue, or throat Dry cough, shortness of breath or trouble breathing Infection--fever, chills, cough, sore throat, wounds that don't heal, pain or trouble when passing urine, general feeling of discomfort or being unwell Heart failure--shortness of breath, swelling of the ankles, feet, or hands, sudden weight gain, unusual weakness or fatigue Unusual bruising or bleeding Side effects that usually do not require medical attention (report these to your care team if they continue or are bothersome): Constipation Diarrhea Hair loss Muscle pain Nausea Vomiting This list may not describe all possible side effects. Call your doctor for medical advice about side effects. You may report side effects to FDA at 1-800-FDA-1088. Where should I keep my medication? This medication is given in a hospital or clinic. It will not be stored at home. NOTE: This sheet is a summary. It may not cover all possible information. If you have questions about this medicine, talk to your doctor, pharmacist, or health care provider.  2023 [employer]vier/Gold Standard (2021-04-12 00:00:00) 

## 2022-11-26 ENCOUNTER — Telehealth: Payer: Self-pay

## 2022-11-26 NOTE — Telephone Encounter (Signed)
Patient notified and voiced understanding.

## 2022-11-26 NOTE — Telephone Encounter (Signed)
-----   Message from Dellia Beckwith, MD sent at 11/23/2022  6:43 PM EDT ----- Regarding: call Can tell them her cancer marker continues to improve, down to 46.2 now. The [employer] is working

## 2022-11-30 ENCOUNTER — Encounter: Payer: Self-pay | Admitting: Oncology

## 2022-11-30 NOTE — Telephone Encounter (Signed)
Done

## 2022-12-03 ENCOUNTER — Other Ambulatory Visit: Payer: Self-pay | Admitting: Hematology and Oncology

## 2022-12-03 DIAGNOSIS — F411 Generalized anxiety disorder: Secondary | ICD-10-CM

## 2022-12-03 DIAGNOSIS — C801 Malignant (primary) neoplasm, unspecified: Secondary | ICD-10-CM

## 2022-12-03 DIAGNOSIS — G4700 Insomnia, unspecified: Secondary | ICD-10-CM

## 2022-12-04 ENCOUNTER — Encounter: Payer: Self-pay | Admitting: Oncology

## 2022-12-07 ENCOUNTER — Encounter: Payer: Self-pay | Admitting: Oncology

## 2022-12-07 ENCOUNTER — Inpatient Hospital Stay: Payer: BC Managed Care – PPO

## 2022-12-07 VITALS — BP 123/61 | HR 99 | Temp 98.0°F | Resp 16 | Wt 136.0 lb

## 2022-12-07 DIAGNOSIS — Z5111 Encounter for antineoplastic chemotherapy: Secondary | ICD-10-CM | POA: Diagnosis not present

## 2022-12-07 DIAGNOSIS — C7951 Secondary malignant neoplasm of bone: Secondary | ICD-10-CM

## 2022-12-07 MED ORDER — FULVESTRANT 250 MG/5ML IM SOSY
500.0000 mg | PREFILLED_SYRINGE | Freq: Once | INTRAMUSCULAR | Status: AC
  Administered 2022-12-07: 500 mg via INTRAMUSCULAR

## 2022-12-07 NOTE — Addendum Note (Signed)
Addended by: Domenic Schwab on: 12/07/2022 09:19 AM   Modules accepted: Orders

## 2022-12-07 NOTE — Patient Instructions (Signed)

## 2022-12-08 ENCOUNTER — Other Ambulatory Visit: Payer: Self-pay | Admitting: Hematology and Oncology

## 2022-12-10 ENCOUNTER — Encounter: Payer: Self-pay | Admitting: Oncology

## 2022-12-11 ENCOUNTER — Inpatient Hospital Stay: Payer: BC Managed Care – PPO

## 2022-12-11 DIAGNOSIS — Z5111 Encounter for antineoplastic chemotherapy: Secondary | ICD-10-CM | POA: Diagnosis not present

## 2022-12-11 DIAGNOSIS — C50412 Malignant neoplasm of upper-outer quadrant of left female breast: Secondary | ICD-10-CM

## 2022-12-11 DIAGNOSIS — C7951 Secondary malignant neoplasm of bone: Secondary | ICD-10-CM

## 2022-12-11 DIAGNOSIS — Z170 Estrogen receptor positive status [ER+]: Secondary | ICD-10-CM

## 2022-12-11 LAB — CBC WITH DIFFERENTIAL (CANCER CENTER ONLY)
Abs Immature Granulocytes: 0.02 10*3/uL (ref 0.00–0.07)
Basophils Absolute: 0 10*3/uL (ref 0.0–0.1)
Basophils Relative: 1 %
Eosinophils Absolute: 0.2 10*3/uL (ref 0.0–0.5)
Eosinophils Relative: 4 %
HCT: 37.9 % (ref 36.0–46.0)
Hemoglobin: 11.9 g/dL — ABNORMAL LOW (ref 12.0–15.0)
Immature Granulocytes: 0 %
Lymphocytes Relative: 20 %
Lymphs Abs: 1 10*3/uL (ref 0.7–4.0)
MCH: 28.3 pg (ref 26.0–34.0)
MCHC: 31.4 g/dL (ref 30.0–36.0)
MCV: 90.2 fL (ref 80.0–100.0)
Monocytes Absolute: 0.5 10*3/uL (ref 0.1–1.0)
Monocytes Relative: 11 %
Neutro Abs: 3.2 10*3/uL (ref 1.7–7.7)
Neutrophils Relative %: 64 %
Platelet Count: 274 10*3/uL (ref 150–400)
RBC: 4.2 MIL/uL (ref 3.87–5.11)
RDW: 18.7 % — ABNORMAL HIGH (ref 11.5–15.5)
WBC Count: 5 10*3/uL (ref 4.0–10.5)
nRBC: 0 % (ref 0.0–0.2)

## 2022-12-11 LAB — CMP (CANCER CENTER ONLY)
ALT: 28 U/L (ref 0–44)
AST: 24 U/L (ref 15–41)
Albumin: 3.6 g/dL (ref 3.5–5.0)
Alkaline Phosphatase: 70 U/L (ref 38–126)
Anion gap: 7 (ref 5–15)
BUN: 14 mg/dL (ref 6–20)
CO2: 29 mmol/L (ref 22–32)
Calcium: 8.9 mg/dL (ref 8.9–10.3)
Chloride: 103 mmol/L (ref 98–111)
Creatinine: 0.64 mg/dL (ref 0.44–1.00)
GFR, Estimated: >60 mL/min (ref >60)
Glucose, Bld: 91 mg/dL (ref 70–99)
Potassium: 3.4 mmol/L — ABNORMAL LOW (ref 3.5–5.1)
Sodium: 139 mmol/L (ref 135–145)
Total Bilirubin: 0.3 mg/dL (ref 0.3–1.2)
Total Protein: 6.4 g/dL — ABNORMAL LOW (ref 6.5–8.1)

## 2022-12-11 NOTE — Progress Notes (Signed)
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 She was on adjuvant hormonal therapy with tamoxifen 20 mg daily from October 2015 to May 2020, but due to elevation of the liver transaminases, was switched to anastrazole.   Malignant neoplasm metastatic to bone [facility]) New bone metastases, November 2022, with multiple marrow replacing lesions within the proximal right femur within the ischium as well as the inferior rami. 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 Ki67 was 60%. PET imaging from December 19th revealed dominant finding of intensely hypermetabolic aggressive skeletal metastasis involving the calvarium, sternum, thoracic and lumbar spine, and pelvis, with potential pathologic fracture of the right femur with internal fixation. There is soft tissue extension into the anterior mediastinum associated with the manubrial metastasis. Multiple spinal vertebral body lesions have cortical destruction along the posterior margin. She started monthly Xgeva in December, and completed radiation therapy. She was receiving THP but cycle 4 was delayed and dose reduction made by 20% due to left leg infection. She will continue with Herceptin/ Perjeta, but we dropped the Taxotere chemotherapy. We then placed her on hormonal therapy with Faslodex injections.     Increased Left Hip Pain In January 2024, she developed increased pain of the left hip. She was found to have innumerable metastatic lesions throughout the pelvis and proximal femur. There is a non displaced pathologic fracture through a large lesion in the superior left acetabulum. She was referred to an orthopedic oncologist at [facility] and they recommended radiation and no surgery. She has now received her palliative radiation. She will follow-up with [facility] in April.    Hypokalemia Her potassium only came up from 3.4 to 3.5 last time despite taking potassium supplement at 20 mEq twice daily. I instructed her to increase it to 3 daily.  Her potassium is still only 3.3 today.   Hypocalcemia Her calcium is borderline but this is with taking 6 tablets daily.  Her vitamin D level was adequate.  I recommended she increase her calcium supplement to 3 tablets TID.  I stressed the importance of her calcium supplement.   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  She was found to be iron deficient in September 2023 and was given IV iron supplement. Her hemoglobin was stable at 11.6 last time.    Abnormal Mammogram She does have thickening of the skin and trabecular thickening of the left breast. I find no significant change on physical exam and feel that a lot of this can be post radiation change. In view of her widespread bone metastases, I will not pursue further evaluation but will continue periodic breast exams.  On CT scan this nodule of the left breast appears to be 12 mm, down from 13 mm in size   Large Cyst of the Right Kidney This arises from the lower pole and  has been present for a long time, previously measuring 11 mm on last scan from September, 2023. During her CT simulation for her left hip radiation, the physicist noted that the cyst is enlarged and so we did re-evaluate.  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   Elevated left hemidiaphragm This is a new finding since the scan of 2020 and may be related to mediastinal involvement.  Is associated with atelectasis at the left base and explains why I heard decreased breath sounds in this area.  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  For now we will continue the Enhertu and Faslodex but keep this in mind for the future.  Plan:  CT scan from February confirmed worsening bone metastases as well as adenopathy in the mediastinum and axilla.  She has a small nodule of the left breast measuring 12 mm and a benign-appearing large cyst of the right kidney.  She is also noted to have elevation of the left hemidiaphragm associated with atelectasis at the left base.  Her treatment was therefore changed to Enhertu along with Faslodex injections and continued Xgeva monthly.  She has a follow-up with her cardiologist in May and we plan an echocardiogram by early June at the latest. She will see an orthopedic oncology specialist at [facility] on 11/28/2022 to further evaluate of the fracture of her left hip. Her day 1 cycle 2 is schedule on 11/02/2022. She continues to receive Faslodex and Xgeva injections every 4 weeks.  I will see her back in 2 weeks with CBC, CMP, and CA 27.29. The patient understands the plans discussed today and is in agreement with them.  She knows to contact our office if she develops concerns prior to her next appointment.   I provided 20 minutes of face-to-face time during this encounter and > 50% was spent counseling as documented under my assessment and plan.    Anna [name] [facility] AT [facility] [address] [place] [address] Dept: [phone] Dept Fax: [phone]   No orders of the defined types  were placed in this encounter.    CHIEF COMPLAINT:  CC: A 52 year old female with history of breast cancer  Current Treatment:  THP; will proceed with HP  INTERVAL HISTORY:  Anna [name] is here today for repeat clinical assessment for her breast cancer metastatic to bone.  Patient states that she feels *** and ***      She denies signs of infection such as sore throat, sinus drainage, cough, or urinary symptoms.  She denies fevers or recurrent chills. She denies pain. She denies nausea, vomiting, chest pain, dyspnea or cough. Her weight {Weight change:10426}.   Her echocardiogram from 10/18/2022 revealed a slight decrease in the ejection fraction to 50-55%, her last one 3 months ago was 55-60%. Her next echocardiogram will be due in early June, 2024. Patient states that she is ok and has been tolerating pain with oxyodone 10mg  every 4-5 hours. She also complains about lower back pain. 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 I informed her that this opens up other treatment options that targets her specific mutation if her condition worsens. She experiences nausea, fatigue, and body pain after her Enhertu treatment during the first week but is tolerating it.  Her day 1 cycle 2 is schedule on 11/02/2022. She continues to receive Faslodex and Xgeva injections every 4 weeks. She will see an orthopedic oncology specialist at [facility] on 11/28/2022 to further evaluate of the fracture of her left hip.  Her CA 27.29 came down from 57.7 to 53.8 by the end of February.  I will see her back in 3 weeks with CBC, CMP, and CA 27.29. She is accompanied today by her husband as usual.   I have reviewed the past medical history, past surgical history, social history and family history with the patient and they are unchanged from previous note.  ALLERGIES:  has No Known Allergies.  MEDICATIONS:  Current Outpatient Medications  Medication Sig Dispense Refill   albuterol  (VENTOLIN HFA) 108 (90 Base) MCG/ACT inhaler Inhale 2 puffs into the lungs every 6 (six) hours as needed for shortness of breath.     ARIPiprazole (ABILIFY) 5 MG tablet Take 2.5 mg by mouth daily.     aspirin 81 MG EC tablet Take 81 mg by mouth daily. Swallow whole.     atorvastatin (LIPITOR) 20 MG tablet Take 1 tablet (20 mg total) by mouth daily. 90 tablet 0   augmented betamethasone dipropionate (DIPROLENE-AF) 0.05 % cream      calcium carbonate (TUMS - DOSED IN MG ELEMENTAL CALCIUM) 500 MG chewable tablet Chew 1 tablet by mouth daily. 4 gummies daily/ not tablet form .     CLENPIQ 10-3.5-12 MG-GM -GM/160ML SOLN      desvenlafaxine (PRISTIQ) 50 MG 24 hr tablet Take 50 mg by mouth daily.     diphenoxylate-atropine (LOMOTIL) 2.5-0.025 MG tablet TAKE TWO TABLETS BY MOUTH FOUR TIMES A DAY AS NEEDED FOR DIARRHEA OR LOOSE STOOLS 100 tablet 1   FARXIGA 10 MG TABS tablet Take 1 tablet (10 mg total) by mouth daily. 30 tablet 11   furosemide (LASIX) 20 MG tablet Take 1 tablet (20 mg total) by mouth daily. (Patient taking differently: Take 20 mg by mouth as needed.) 30 tablet 0   gabapentin (NEURONTIN) 300 MG capsule Take 1 capsule (300 mg total) by mouth at bedtime. 30 capsule 1   labetalol (NORMODYNE) 200 MG tablet Take 1 tablet (200 mg total) by mouth 2 (two) times daily. 180 tablet 3   loratadine (CLARITIN) 10 MG tablet TAKE ONE TABLET BY MOUTH DAILY 30 tablet 2   LORazepam (ATIVAN) 1 MG tablet TAKE ONE TABLET BY MOUTH EVERY 8 HOURS 30 tablet 1   magic mouthwash (lidocaine, diphenhydrAMINE, alum & mag hydroxide) suspension Take 5 mLs by mouth every 3 (three) hours as needed. Swish and spit; for throat/mouth pain     naloxone (NARCAN) nasal spray 4 mg/0.1 mL One spray in nostril as needed, may repeat every 2 to 3 minutes until medical assistance becomes available 4 each 1   nitroGLYCERIN (NITROSTAT) 0.4 MG SL tablet Place 0.4 mg under the tongue every 5 (five) minutes as needed for chest pain.      nystatin (MYCOSTATIN/NYSTOP) powder Apply 1 Application topically 2 (two) times daily.     ondansetron (ZOFRAN) 4 MG tablet Take 1 tablet (4 mg total) by mouth every 4 (four) hours as needed for nausea. 90 tablet 3   Oxycodone HCl 10 MG TABS TAKE ONE TABLET BY MOUTH EVERY 4 HOURS AS NEEDED FOR PAIN 180 tablet 0   OZEMPIC, 0.25 OR 0.5 MG/DOSE, 2 MG/3ML SOPN Inject into the skin.     potassium chloride SA (KLOR-CON M) 20 MEQ tablet Take  1 tablet (20 mEq total) by mouth 2 (two) times daily. 60 tablet 0   prochlorperazine (COMPAZINE) 10 MG tablet Take 1 tablet (10 mg total) by mouth every 6 (six) hours as needed for nausea or vomiting. 90 tablet 3   valsartan (DIOVAN) 40 MG tablet TAKE ONE TABLET BY MOUTH EVERY DAY 30 tablet 2   Current Facility-Administered Medications  Medication Dose Route Frequency Provider Last Rate Last Admin   0.9 %  sodium chloride infusion  500 mL Intravenous Once Lynann Bologna, MD       technetium sestamibi generic (CARDIOLITE) injection 10.5 millicurie  10.5 millicurie Intravenous Once PRN Revankar, Aundra Dubin, MD        HISTORY OF PRESENT ILLNESS:   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 (HCC)   07/23/2013 Cancer Staging   Staging form: Breast, AJCC 7th Edition - Clinical stage from 07/23/2013: Stage IIB (T2, N1, M0) - Signed by Dellia Beckwith, MD on 07/04/2020 Prognostic indicators: Pos LVI   09/22/2021 - 04/06/2022 Chemotherapy   Patient is on Treatment Plan : BREAST  Trastuzumab + Pertuzumab q21d      09/22/2021 - 09/21/2022 Chemotherapy   Patient is on Treatment Plan : BREAST Trastuzumab + Pertuzumab q21d     10/12/2022 -  Chemotherapy   Patient is on Treatment Plan : BREAST METASTATIC Fam-Trastuzumab Deruxtecan-nxki (Enhertu) (5.4) q21d     Malignant neoplasm metastatic to bone (HCC)  06/28/2021 Initial  Diagnosis   Bone metastases (HCC)   09/22/2021 - 04/06/2022 Chemotherapy   Patient is on Treatment Plan : BREAST  Trastuzumab + Pertuzumab q21d      09/22/2021 - 09/21/2022 Chemotherapy   Patient is on Treatment Plan : BREAST Trastuzumab + Pertuzumab q21d     05/11/2022 Imaging   CT chest, abdomen and pelvis:  IMPRESSION:  1. Widespread bony metastatic disease shows increased sclerosis  since previous imaging. This is likely related to response to  therapy in the interval. There is a single lesion along the LEFT  iliac which shows continued lytic change and warrants attention on  follow-up  2. Interval development of pathologic fractures at in the upper  thoracic spine and at the thoracolumbar junction with associated  kyphosis.  3. No signs of solid organ or nodal metastatic disease.  4. Marked elevation of the LEFT hemidiaphragm as on the prior PET.  5. Aortic atherosclerosis.    10/12/2022 -  Chemotherapy   Patient is on Treatment Plan : BREAST METASTATIC Fam-Trastuzumab Deruxtecan-nxki (Enhertu) (5.4) q21d         REVIEW OF SYSTEMS:  Review of Systems  Constitutional:  Negative for appetite change, chills, diaphoresis, fatigue, fever and unexpected weight change.  HENT:  Negative.  Negative for hearing loss, lump/mass, mouth sores, nosebleeds, sore throat, tinnitus, trouble swallowing and voice change.   Eyes: Negative.  Negative for eye problems and icterus.  Respiratory:  Positive for shortness of breath (mild). Negative for chest tightness, cough, hemoptysis and wheezing.   Cardiovascular: Negative.  Negative for chest pain, leg swelling and palpitations.  Gastrointestinal: Negative.  Negative for abdominal distention, abdominal pain, blood in stool, constipation, diarrhea, nausea, rectal pain and vomiting.  Endocrine: Negative.   Genitourinary: Negative.  Negative for bladder incontinence, difficulty urinating, dyspareunia, dysuria, frequency, hematuria, menstrual problem,  nocturia, pelvic pain, vaginal bleeding and vaginal discharge.   Musculoskeletal:  Positive for back  pain (5/10 and pain in her left hip.). Negative for arthralgias, flank pain, gait problem, myalgias, neck pain and neck stiffness.       This is severe and the MRI is extremely abnormal with a non displaced pathologic fracture through the superior left acetabulum. Persistent pain of the left hip.  Skin:  Negative for itching, rash and wound.       Severe dry skin.  Neurological: Negative.  Negative for dizziness, extremity weakness, gait problem, headaches, light-headedness, numbness, seizures and speech difficulty.  Hematological: Negative.  Negative for adenopathy. Does not bruise/bleed easily.  Psychiatric/Behavioral: Negative.  Negative for confusion, decreased concentration, depression, sleep disturbance and suicidal ideas. The patient is not nervous/anxious.    VITALS:  Last menstrual period 08/18/2013.  Wt Readings from Last 3 Encounters:  12/07/22 136 lb (61.7 kg)  11/23/22 136 lb 1.3 oz (61.7 kg)  11/21/22 137 lb 14.4 oz (62.6 kg)    There is no height or weight on file to calculate BMI.  Performance status (ECOG): 1 - Symptomatic but completely ambulatory  PHYSICAL EXAM:  Physical Exam Vitals and nursing note reviewed. Exam conducted with a chaperone present.  Constitutional:      General: She is not in acute distress.    Appearance: Normal appearance. She is normal weight. She is not ill-appearing, toxic-appearing or diaphoretic.  HENT:     Head: Normocephalic and atraumatic.     Right Ear: Tympanic membrane, ear canal and external ear normal. There is no impacted cerumen.     Left Ear: Tympanic membrane, ear canal and external ear normal. There is no impacted cerumen.     Nose: Nose normal. No congestion or rhinorrhea.     Mouth/Throat:     Mouth: Mucous membranes are moist.     Pharynx: Oropharynx is clear. No oropharyngeal exudate or posterior oropharyngeal erythema.   Eyes:     General: No scleral icterus.       Right eye: No discharge.        Left eye: No discharge.     Extraocular Movements: Extraocular movements intact.     Conjunctiva/sclera: Conjunctivae normal.     Pupils: Pupils are equal, round, and reactive to light.  Neck:     Vascular: No carotid bruit.  Cardiovascular:     Rate and Rhythm: Normal rate and regular rhythm.     Pulses: Normal pulses.     Heart sounds: Normal heart sounds. No murmur heard.    No friction rub. No gallop.  Pulmonary:     Effort: Pulmonary effort is normal. No respiratory distress.     Breath sounds: Normal breath sounds. No stridor or decreased air movement. No wheezing, rhonchi or rales.  Chest:     Chest wall: No tenderness.     Comments: Well healed scar in the upper outer of the left breast. Firmness diffusely in the left breast consistent with post radiation changes and the thickening of skin in both breast.  Abdominal:     General: Bowel sounds are normal. There is no distension.     Palpations: Abdomen is soft. There is no hepatomegaly, splenomegaly or mass.     Tenderness: There is no abdominal tenderness. There is no right CVA tenderness, left CVA tenderness, guarding or rebound.     Hernia: No hernia is present.  Musculoskeletal:        General: No swelling, tenderness, deformity or signs of injury. Normal range of motion.     Cervical back: Normal range  of motion and neck supple. No rigidity or tenderness.     Right lower leg: No edema.     Left lower leg: No edema.     Comments: Legs look stable still very erythematous and dry with some edema and induration.  Lymphadenopathy:     Cervical: No cervical adenopathy.     Right cervical: No superficial, deep or posterior cervical adenopathy.    Left cervical: No superficial, deep or posterior cervical adenopathy.     Upper Body:     Right upper body: No supraclavicular, axillary or pectoral adenopathy.     Left upper body: No supraclavicular,  axillary or pectoral adenopathy.  Skin:    General: Skin is warm and dry.     Coloration: Skin is not jaundiced or pale.     Findings: No bruising, erythema, lesion or rash.     Comments: Right leg has erythema and dry skin Left leg has severe erythema with pustules and areas of ulceration and scaling.   Neurological:     General: No focal deficit present.     Mental Status: She is alert and oriented to person, place, and time. Mental status is at baseline.     Cranial Nerves: No cranial nerve deficit.     Sensory: No sensory deficit.     Motor: No weakness.     Coordination: Coordination normal.     Gait: Gait normal.     Deep Tendon Reflexes: Reflexes normal.  Psychiatric:        Mood and Affect: Mood normal.        Behavior: Behavior normal.        Thought Content: Thought content normal.        Judgment: Judgment normal.    LABORATORY DATA:  I have reviewed the data as listed CBC 2 wk ago (11/21/22) 1 mo ago (10/31/22) 1 mo ago (10/22/22) 2 mo ago (10/09/22) 2 mo ago (09/18/22) 3 mo ago (08/29/22) 4 mo ago (08/08/22)  Hemoglobin 12.0 - 16.0 11.8 Abnormal  11.6 Low  R 11.6 Abnormal  12.1 R 11.0 Low  R 11.2 Low  R 10.2 Low  R  HCT 36 - 46 36 38.4 R 36 41.2 R 37.6 R 39.2 R 35.2 Low  R  Neutrophils Absolute 2.39 2.5 R 4.31 3.3 R 3.7 R 4.2 R 4.5 R  Platelets 150 - 400 K/uL 317 292 237        Component Ref Range & Units 2 wk ago (11/21/22) 1 mo ago (10/31/22) 2 mo ago (10/09/22) 2 mo ago (09/18/22)  Sodium 135 - 145 mmol/L 138 141 139 140  Potassium 3.5 - 5.1 mmol/L 3.6 3.3 Low  4.2 3.5  Chloride 98 - 111 mmol/L 101 102 104 104  CO2 22 - 32 mmol/L 29 31 29 28   Glucose, Bld 70 - 99 mg/dL 89 83 CM 161 High  CM 90 CM  BUN 6 - 20 mg/dL 14 10 16 15   Creatinine 0.44 - 1.00 mg/dL 0.96 0.45 4.09 8.11  Calcium 8.9 - 10.3 mg/dL 8.4 Low  8.9 8.8 Low  8.4 Low   Total Protein 6.5 - 8.1 g/dL 6.3 Low  6.5 6.9 6.9  Albumin 3.5 - 5.0 g/dL 3.6 3.8 3.8 3.7  AST 15 - 41 U/L 22 19  23 17   ALT 0 - 44 U/L 24 21 22 15   Alkaline Phosphatase 38 - 126 U/L 63 68 89 117  Total Bilirubin 0.3 - 1.2 mg/dL 0.2 Low  0.2  Low  0.4 0.4  GFR, Estimated >60 mL/min >60 >60 CM       Component Ref Range & Units 2 wk ago (11/21/22) 2 mo ago (10/09/22) 3 mo ago (08/29/22) 5 mo ago (06/28/22) 1 yr ago (09/06/21) 1 yr ago (07/20/21)  CA 27.29 0.0 - 38.6 U/mL 46.2 High  53.8 High  CM 57.7 High  CM 50.9 High  CM 106.6 High  CM 75.6 High  C      Component Value Date/Time   NA 138 11/21/2022 1342   NA 136 (A) 10/22/2022 0000   K 3.6 11/21/2022 1342   CL 101 11/21/2022 1342   CO2 29 11/21/2022 1342   GLUCOSE 89 11/21/2022 1342   BUN 14 11/21/2022 1342   BUN 19 10/22/2022 0000   CREATININE 0.75 11/21/2022 1342   CALCIUM 8.4 (L) 11/21/2022 1342   PROT 6.3 (L) 11/21/2022 1342   ALBUMIN 3.6 11/21/2022 1342   AST 22 11/21/2022 1342   ALT 24 11/21/2022 1342   ALKPHOS 63 11/21/2022 1342   BILITOT 0.2 (L) 11/21/2022 1342   GFRNONAA >60 11/21/2022 1342   No results found for: "SPEP", "UPEP"  Lab Results  Component Value Date   WBC 4.2 11/21/2022   NEUTROABS 2.39 11/21/2022   HGB 11.8 (A) 11/21/2022   HCT 36 11/21/2022   MCV 84 11/21/2022   PLT 317 11/21/2022   Component Ref Range & Units 3 wk ago (10/09/22) 2 mo ago (08/29/22) 4 mo ago (06/28/22) 1 yr ago (09/06/21) 1 yr ago (07/20/21)  CA 27.29 0.0 - 38.6 U/mL 53.8 High  57.7 High  CM 50.9 High  CM 106.6 High  CM 75.6 High      Chemistry      Component Value Date/Time   NA 138 11/21/2022 1342   NA 136 (A) 10/22/2022 0000   K 3.6 11/21/2022 1342   CL 101 11/21/2022 1342   CO2 29 11/21/2022 1342   BUN 14 11/21/2022 1342   BUN 19 10/22/2022 0000   CREATININE 0.75 11/21/2022 1342   GLU 110 10/22/2022 0000      Component Value Date/Time   CALCIUM 8.4 (L) 11/21/2022 1342   ALKPHOS 63 11/21/2022 1342   AST 22 11/21/2022 1342   ALT 24 11/21/2022 1342   BILITOT 0.2 (L) 11/21/2022 1342      RADIOGRAPHIC  STUDIES:       I,Jasmine M Lassiter,acting as a scribe for Dellia Beckwith, MD.,have documented all relevant documentation on the behalf of Dellia Beckwith, MD,as directed by  Dellia Beckwith, MD while in the presence of Dellia Beckwith, MD.

## 2022-12-12 ENCOUNTER — Encounter: Payer: Self-pay | Admitting: Oncology

## 2022-12-12 ENCOUNTER — Inpatient Hospital Stay: Payer: BC Managed Care – PPO | Attending: Hematology and Oncology | Admitting: Oncology

## 2022-12-12 ENCOUNTER — Other Ambulatory Visit: Payer: Self-pay | Admitting: Oncology

## 2022-12-12 VITALS — BP 123/81 | HR 97 | Temp 98.2°F | Resp 17 | Ht 59.0 in | Wt 140.3 lb

## 2022-12-12 DIAGNOSIS — M8580 Other specified disorders of bone density and structure, unspecified site: Secondary | ICD-10-CM | POA: Diagnosis not present

## 2022-12-12 DIAGNOSIS — D539 Nutritional anemia, unspecified: Secondary | ICD-10-CM

## 2022-12-12 DIAGNOSIS — Z780 Asymptomatic menopausal state: Secondary | ICD-10-CM

## 2022-12-12 DIAGNOSIS — C50412 Malignant neoplasm of upper-outer quadrant of left female breast: Secondary | ICD-10-CM | POA: Insufficient documentation

## 2022-12-12 DIAGNOSIS — Z170 Estrogen receptor positive status [ER+]: Secondary | ICD-10-CM | POA: Insufficient documentation

## 2022-12-12 DIAGNOSIS — E876 Hypokalemia: Secondary | ICD-10-CM | POA: Insufficient documentation

## 2022-12-12 DIAGNOSIS — C7951 Secondary malignant neoplasm of bone: Secondary | ICD-10-CM | POA: Insufficient documentation

## 2022-12-12 DIAGNOSIS — D649 Anemia, unspecified: Secondary | ICD-10-CM | POA: Insufficient documentation

## 2022-12-12 DIAGNOSIS — Z5111 Encounter for antineoplastic chemotherapy: Secondary | ICD-10-CM | POA: Insufficient documentation

## 2022-12-12 DIAGNOSIS — Z5112 Encounter for antineoplastic immunotherapy: Secondary | ICD-10-CM | POA: Insufficient documentation

## 2022-12-13 ENCOUNTER — Other Ambulatory Visit: Payer: Self-pay

## 2022-12-13 MED FILL — Dexamethasone Sodium Phosphate Inj 100 MG/10ML: INTRAMUSCULAR | Qty: 1 | Status: AC

## 2022-12-14 ENCOUNTER — Inpatient Hospital Stay: Payer: BC Managed Care – PPO

## 2022-12-14 VITALS — BP 91/63 | HR 97 | Temp 97.7°F | Resp 18 | Wt 138.0 lb

## 2022-12-14 DIAGNOSIS — Z5111 Encounter for antineoplastic chemotherapy: Secondary | ICD-10-CM | POA: Diagnosis present

## 2022-12-14 DIAGNOSIS — E876 Hypokalemia: Secondary | ICD-10-CM | POA: Diagnosis not present

## 2022-12-14 DIAGNOSIS — Z170 Estrogen receptor positive status [ER+]: Secondary | ICD-10-CM

## 2022-12-14 DIAGNOSIS — D649 Anemia, unspecified: Secondary | ICD-10-CM | POA: Diagnosis not present

## 2022-12-14 DIAGNOSIS — C7951 Secondary malignant neoplasm of bone: Secondary | ICD-10-CM

## 2022-12-14 DIAGNOSIS — Z5112 Encounter for antineoplastic immunotherapy: Secondary | ICD-10-CM | POA: Diagnosis present

## 2022-12-14 DIAGNOSIS — C50412 Malignant neoplasm of upper-outer quadrant of left female breast: Secondary | ICD-10-CM | POA: Diagnosis not present

## 2022-12-14 MED ORDER — SODIUM CHLORIDE 0.9 % IV SOLN
150.0000 mg | Freq: Once | INTRAVENOUS | Status: AC
  Administered 2022-12-14: 150 mg via INTRAVENOUS
  Filled 2022-12-14: qty 150

## 2022-12-14 MED ORDER — POTASSIUM CHLORIDE 10 MEQ/100ML IV SOLN
10.0000 meq | INTRAVENOUS | Status: AC
  Administered 2022-12-14 (×2): 10 meq via INTRAVENOUS
  Filled 2022-12-14 (×2): qty 100

## 2022-12-14 MED ORDER — DEXTROSE 5 % IV SOLN: Freq: Once | INTRAVENOUS | Status: AC

## 2022-12-14 MED ORDER — SODIUM CHLORIDE 0.9% FLUSH
10.0000 mL | INTRAVENOUS | Status: DC | PRN
  Administered 2022-12-14: 10 mL

## 2022-12-14 MED ORDER — PALONOSETRON HCL INJECTION 0.25 MG/5ML
0.2500 mg | Freq: Once | INTRAVENOUS | Status: AC
  Administered 2022-12-14: 0.25 mg via INTRAVENOUS
  Filled 2022-12-14: qty 5

## 2022-12-14 MED ORDER — HEPARIN SOD (PORK) LOCK FLUSH 100 UNIT/ML IV SOLN
500.0000 [IU] | Freq: Once | INTRAVENOUS | Status: AC | PRN
  Administered 2022-12-14: 500 [IU]

## 2022-12-14 MED ORDER — FAM-TRASTUZUMAB DERUXTECAN-NXKI CHEMO 100 MG IV SOLR
5.4000 mg/kg | Freq: Once | INTRAVENOUS | Status: AC
  Administered 2022-12-14: 340 mg via INTRAVENOUS
  Filled 2022-12-14: qty 17

## 2022-12-14 MED ORDER — DENOSUMAB 120 MG/1.7ML ~~LOC~~ SOLN
120.0000 mg | Freq: Once | SUBCUTANEOUS | Status: AC
  Administered 2022-12-14: 120 mg via SUBCUTANEOUS
  Filled 2022-12-14: qty 1.7

## 2022-12-14 MED ORDER — SODIUM CHLORIDE 0.9 % IV SOLN
10.0000 mg | Freq: Once | INTRAVENOUS | Status: AC
  Administered 2022-12-14: 10 mg via INTRAVENOUS
  Filled 2022-12-14: qty 10

## 2022-12-14 MED ORDER — DIPHENHYDRAMINE HCL 25 MG PO CAPS: 50.0000 mg | ORAL_CAPSULE | Freq: Once | ORAL | Status: DC

## 2022-12-14 MED ORDER — ACETAMINOPHEN 325 MG PO TABS: 650.0000 mg | ORAL_TABLET | Freq: Once | ORAL | Status: DC

## 2022-12-14 NOTE — Patient Instructions (Signed)
Fam-Trastuzumab Deruxtecan Injection What is this medication? FAM-TRASTUZUMAB DERUXTECAN (fam-tras TOOZ eu mab DER ux TEE kan) treats some types of cancer. It works by blocking a protein that causes cancer cells to grow and multiply. This helps to slow or stop the spread of cancer cells. This medicine may be used for other purposes; ask your health care provider or pharmacist if you have questions. COMMON BRAND NAME(S): ENHERTU What should I tell my care team before I take this medication? They need to know if you have any of these conditions: Heart disease Heart failure Infection, especially a viral infection, such as chickenpox, cold sores, or herpes Liver disease Lung or breathing disease, such as asthma or COPD An unusual or allergic reaction to fam-trastuzumab deruxtecan, other medications, foods, dyes, or preservatives Pregnant or trying to get pregnant Breast-feeding How should I use this medication? This medication is injected into a vein. It is given by your care team in a hospital or clinic setting. A special MedGuide will be given to you before each treatment. Be sure to read this information carefully each time. Talk to your care team about the use of this medication in children. Special care may be needed. Overdosage: If you think you have taken too much of this medicine contact a poison control center or emergency room at once. NOTE: This medicine is only for you. Do not share this medicine with others. What if I miss a dose? It is important not to miss your dose. Call your care team if you are unable to keep an appointment. What may interact with this medication? Interactions are not expected. This list may not describe all possible interactions. Give your health care provider a list of all the medicines, herbs, non-prescription drugs, or dietary supplements you use. Also tell them if you smoke, drink alcohol, or use illegal drugs. Some items may interact with your  medicine. What should I watch for while using this medication? Visit your care team for regular checks on your progress. Tell your care team if your symptoms do not start to get better or if they get worse. Your condition will be monitored carefully while you are receiving this medication. Do not become pregnant while taking this medication or for 7 months after stopping it. Women should inform their care team if they wish to become pregnant or think they might be pregnant. Men should not father a child while taking this medication and for 4 months after stopping it. There is potential for serious side effects to an unborn child. Talk to your care team for more information. Do not breast-feed an infant while taking this medication or for 7 months after the last dose. This medication has caused decreased sperm counts in some men. This may make it more difficult to father a child. Talk to your care team if you are concerned about your fertility. This medication may increase your risk to bruise or bleed. Call your care team if you notice any unusual bleeding. Be careful brushing or flossing your teeth or using a toothpick because you may get an infection or bleed more easily. If you have any dental work done, tell your dentist you are receiving this medication. This medication may cause dry eyes and blurred vision. If you wear contact lenses, you may feel some discomfort. Lubricating eye drops may help. See your care team if the problem does not go away or is severe. This medication may increase your risk of getting an infection. Call your care team for   advice if you get a fever, chills, sore throat, or other symptoms of a cold or flu. Do not treat yourself. Try to avoid being around people who are sick. Avoid taking medications that contain aspirin, acetaminophen, ibuprofen, naproxen, or ketoprofen unless instructed by your care team. These medications may hide a fever. What side effects may I notice from  receiving this medication? Side effects that you should report to your care team as soon as possible: Allergic reactions--skin rash, itching, hives, swelling of the face, lips, tongue, or throat Dry cough, shortness of breath or trouble breathing Infection--fever, chills, cough, sore throat, wounds that don't heal, pain or trouble when passing urine, general feeling of discomfort or being unwell Heart failure--shortness of breath, swelling of the ankles, feet, or hands, sudden weight gain, unusual weakness or fatigue Unusual bruising or bleeding Side effects that usually do not require medical attention (report these to your care team if they continue or are bothersome): Constipation Diarrhea Hair loss Muscle pain Nausea Vomiting This list may not describe all possible side effects. Call your doctor for medical advice about side effects. You may report side effects to FDA at 1-800-FDA-1088. Where should I keep my medication? This medication is given in a hospital or clinic. It will not be stored at home. NOTE: This sheet is a summary. It may not cover all possible information. If you have questions about this medicine, talk to your doctor, pharmacist, or health care provider.  2023 [employer] Standard (2021-04-12 00:00:00) Denosumab Injection (Oncology) What is this medication? DENOSUMAB (den oh SUE mab) prevents weakened bones caused by cancer. It may also be used to treat noncancerous bone tumors that cannot be removed by surgery. It can also be used to treat high calcium levels in the blood caused by cancer. It works by blocking a protein that causes bones to break down quickly. This slows down the release of calcium from bones, which lowers calcium levels in your blood. It also makes your bones stronger and less likely to break (fracture). This medicine may be used for other purposes; ask your health care provider or pharmacist if you have questions. COMMON BRAND NAME(S): XGEVA What  should I tell my care team before I take this medication? They need to know if you have any of these conditions: Dental disease Having surgery or tooth extraction Infection Kidney disease Low levels of calcium or vitamin D in the blood Malnutrition On hemodialysis Skin conditions or sensitivity Thyroid or parathyroid disease An unusual reaction to denosumab, other medications, foods, dyes, or preservatives Pregnant or trying to get pregnant Breast-feeding How should I use this medication? This medication is for injection under the skin. It is given by your care team in a hospital or clinic setting. A special MedGuide will be given to you before each treatment. Be sure to read this information carefully each time. Talk to your care team about the use of this medication in children. While it may be prescribed for children as young as 13 years for selected conditions, precautions do apply. Overdosage: If you think you have taken too much of this medicine contact a poison control center or emergency room at once. NOTE: This medicine is only for you. Do not share this medicine with others. What if I miss a dose? Keep appointments for follow-up doses. It is important not to miss your dose. Call your care team if you are unable to keep an appointment. What may interact with this medication? Do not take this medication with  any of the following: Other medications containing denosumab This medication may also interact with the following: Medications that lower your chance of fighting infection Steroid medications, such as prednisone or cortisone This list may not describe all possible interactions. Give your health care provider a list of all the medicines, herbs, non-prescription drugs, or dietary supplements you use. Also tell them if you smoke, drink alcohol, or use illegal drugs. Some items may interact with your medicine. What should I watch for while using this medication? Your condition  will be monitored carefully while you are receiving this medication. You may need blood work while taking this medication. This medication may increase your risk of getting an infection. Call your care team for advice if you get a fever, chills, sore throat, or other symptoms of a cold or flu. Do not treat yourself. Try to avoid being around people who are sick. You should make sure you get enough calcium and vitamin D while you are taking this medication, unless your care team tells you not to. Discuss the foods you eat and the vitamins you take with your care team. Some people who take this medication have severe bone, joint, or muscle pain. This medication may also increase your risk for jaw problems or a broken thigh bone. Tell your care team right away if you have severe pain in your jaw, bones, joints, or muscles. Tell your care team if you have any pain that does not go away or that gets worse. Talk to your care team if you may be pregnant. Serious birth defects can occur if you take this medication during pregnancy and for 5 months after the last dose. You will need a negative pregnancy test before starting this medication. Contraception is recommended while taking this medication and for 5 months after the last dose. Your care team can help you find the option that works for you. What side effects may I notice from receiving this medication? Side effects that you should report to your care team as soon as possible: Allergic reactions--skin rash, itching, hives, swelling of the face, lips, tongue, or throat Bone, joint, or muscle pain Low calcium level--muscle pain or cramps, confusion, tingling, or numbness in the hands or feet Osteonecrosis of the jaw--pain, swelling, or redness in the mouth, numbness of the jaw, poor healing after dental work, unusual discharge from the mouth, visible bones in the mouth Side effects that usually do not require medical attention (report to your care team if they  continue or are bothersome): Cough Diarrhea Fatigue Headache Nausea This list may not describe all possible side effects. Call your doctor for medical advice about side effects. You may report side effects to FDA at 1-800-FDA-1088. Where should I keep my medication? This medication is given in a hospital or clinic. It will not be stored at home. NOTE: This sheet is a summary. It may not cover all possible information. If you have questions about this medicine, talk to your doctor, pharmacist, or health care provider.  2023 [employer] Standard (2021-12-18 00:00:00)

## 2022-12-17 ENCOUNTER — Other Ambulatory Visit: Payer: Self-pay | Admitting: Oncology

## 2022-12-17 DIAGNOSIS — C7951 Secondary malignant neoplasm of bone: Secondary | ICD-10-CM

## 2022-12-17 DIAGNOSIS — M545 Low back pain, unspecified: Secondary | ICD-10-CM

## 2022-12-17 DIAGNOSIS — R0789 Other chest pain: Secondary | ICD-10-CM

## 2022-12-19 ENCOUNTER — Other Ambulatory Visit: Payer: Self-pay | Admitting: Hematology and Oncology

## 2022-12-19 ENCOUNTER — Telehealth: Payer: Self-pay

## 2022-12-19 ENCOUNTER — Encounter: Payer: Self-pay | Admitting: Oncology

## 2022-12-19 ENCOUNTER — Other Ambulatory Visit: Payer: Self-pay

## 2022-12-19 DIAGNOSIS — M545 Low back pain, unspecified: Secondary | ICD-10-CM

## 2022-12-19 DIAGNOSIS — C7951 Secondary malignant neoplasm of bone: Secondary | ICD-10-CM

## 2022-12-19 DIAGNOSIS — R0789 Other chest pain: Secondary | ICD-10-CM

## 2022-12-19 MED ORDER — OXYCODONE HCL 10 MG PO TABS
10.0000 mg | ORAL_TABLET | ORAL | 0 refills | Status: DC | PRN
Start: 2022-12-19 — End: 2023-01-25

## 2022-12-19 NOTE — Telephone Encounter (Signed)
Patient request oxycodone refill. 

## 2022-12-19 NOTE — Telephone Encounter (Signed)
Patient aware pain med refill called in.

## 2022-12-21 ENCOUNTER — Other Ambulatory Visit: Payer: Self-pay

## 2022-12-25 ENCOUNTER — Ambulatory Visit: Payer: BC Managed Care – PPO | Admitting: Cardiology

## 2022-12-27 ENCOUNTER — Other Ambulatory Visit: Payer: Self-pay | Admitting: Hematology and Oncology

## 2022-12-27 NOTE — Progress Notes (Signed)
[facility]. Pt is here for radiation. 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

## 2023-01-01 ENCOUNTER — Inpatient Hospital Stay: Payer: BC Managed Care – PPO

## 2023-01-01 DIAGNOSIS — Z170 Estrogen receptor positive status [ER+]: Secondary | ICD-10-CM

## 2023-01-01 DIAGNOSIS — Z5111 Encounter for antineoplastic chemotherapy: Secondary | ICD-10-CM | POA: Diagnosis not present

## 2023-01-01 DIAGNOSIS — C7951 Secondary malignant neoplasm of bone: Secondary | ICD-10-CM

## 2023-01-01 LAB — CMP (CANCER CENTER ONLY)
ALT: 19 U/L (ref 0–44)
AST: 21 U/L (ref 15–41)
Albumin: 3.5 g/dL (ref 3.5–5.0)
Alkaline Phosphatase: 52 U/L (ref 38–126)
Anion gap: 8 (ref 5–15)
BUN: 10 mg/dL (ref 6–20)
CO2: 27 mmol/L (ref 22–32)
Calcium: 8.1 mg/dL — ABNORMAL LOW (ref 8.9–10.3)
Chloride: 104 mmol/L (ref 98–111)
Creatinine: 0.6 mg/dL (ref 0.44–1.00)
GFR, Estimated: >60 mL/min (ref >60)
Glucose, Bld: 137 mg/dL — ABNORMAL HIGH (ref 70–99)
Potassium: 2.8 mmol/L — ABNORMAL LOW (ref 3.5–5.1)
Sodium: 139 mmol/L (ref 135–145)
Total Bilirubin: 0.3 mg/dL (ref 0.3–1.2)
Total Protein: 6 g/dL — ABNORMAL LOW (ref 6.5–8.1)

## 2023-01-01 LAB — CBC WITH DIFFERENTIAL (CANCER CENTER ONLY)
Abs Immature Granulocytes: 0.02 10*3/uL (ref 0.00–0.07)
Basophils Absolute: 0 10*3/uL (ref 0.0–0.1)
Basophils Relative: 1 %
Eosinophils Absolute: 0.4 10*3/uL (ref 0.0–0.5)
Eosinophils Relative: 12 %
HCT: 38.9 % (ref 36.0–46.0)
Hemoglobin: 12 g/dL (ref 12.0–15.0)
Immature Granulocytes: 1 %
Lymphocytes Relative: 12 %
Lymphs Abs: 0.4 10*3/uL — ABNORMAL LOW (ref 0.7–4.0)
MCH: 28.8 pg (ref 26.0–34.0)
MCHC: 30.8 g/dL (ref 30.0–36.0)
MCV: 93.5 fL (ref 80.0–100.0)
Monocytes Absolute: 0.4 10*3/uL (ref 0.1–1.0)
Monocytes Relative: 12 %
Neutro Abs: 2 10*3/uL (ref 1.7–7.7)
Neutrophils Relative %: 62 %
Platelet Count: 272 10*3/uL (ref 150–400)
RBC: 4.16 MIL/uL (ref 3.87–5.11)
RDW: 18.3 % — ABNORMAL HIGH (ref 11.5–15.5)
WBC Count: 3.2 10*3/uL — ABNORMAL LOW (ref 4.0–10.5)
nRBC: 0 % (ref 0.0–0.2)

## 2023-01-01 NOTE — Progress Notes (Signed)
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 She was on adjuvant hormonal therapy with tamoxifen 20 mg daily from October 2015 to May 2020, but due to elevation of the liver transaminases, was switched to anastrazole.   Malignant neoplasm metastatic to bone [facility]) New bone metastases, November 2022, with multiple marrow replacing lesions within the proximal right femur within the ischium as well as the inferior rami. 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 Ki67 was 60%. PET imaging from December 19th revealed dominant finding of intensely hypermetabolic aggressive skeletal metastasis involving the calvarium, sternum, thoracic and lumbar spine, and pelvis, with potential pathologic fracture of the right femur with internal fixation. There is soft tissue extension into the anterior mediastinum associated with the manubrial metastasis. Multiple spinal vertebral body lesions have cortical destruction along the posterior margin. She started monthly Xgeva in December, and completed radiation therapy. She was receiving THP but cycle 4 was delayed and dose reduction made by 20% due to left leg skin infection. She continued with Herceptin/Perjeta until she had progression of disease. We then switched her to [facility] chemotherapy, but she has had significant  toxicities of nausea, vomiting, diarrhea, and hypokalemia. She was also placed on hormonal therapy with Faslodex injections.    Increased Left Hip Pain In January 2024, she developed increased pain of the left hip. She was found to have innumerable metastatic lesions throughout the pelvis and proximal femur. There is a non displaced pathologic fracture through a large lesion in the superior left acetabulum. She was referred to an orthopedic oncologist at [facility] and they recommended radiation and no surgery. She has now received her palliative radiation. She followed-up with [facility] in April and they do not recommend surgery but will continue surveillance.    Hypokalemia This is severe due to intolerance to oral supplement and persistent nausea, vomiting, and diarrhea. We will have to give her IV potassium this week and recheck next week. She will likely need IV potassium again next week on 01/10/2023, and regularly.    Hypocalcemia Her calcium is borderline but this is with taking 6 tablets daily.  Her vitamin D level was adequate.  I recommended she increase her calcium supplement to 3 tablets TID.  I stressed the importance of her calcium supplement.   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  She was found to be iron deficient in September 2023 and was given IV iron supplement. Her hemoglobin was stable at 11.6 last time.    Abnormal Mammogram She does have thickening of the skin and trabecular thickening of the left breast. I find no significant change on physical exam and feel that a lot of this can be post radiation change. In view of her widespread bone metastases, I will not pursue further evaluation but will continue periodic breast exams.  On  CT scan this nodule of the left breast appears to be 12 mm, down from 13 mm in size   Large Cyst of the Right Kidney This arises from the lower  pole and has been present for a long time, previously measuring 11 mm on last scan from September, 2023. During her CT simulation for her left hip radiation, the physicist noted that the cyst is enlarged and so we did re-evaluate.  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   Elevated left hemidiaphragm This is a new finding since the scan of 2020 and may be related to mediastinal involvement.  Is associated with atelectasis at the left base and explains why I heard decreased breath sounds in this area.  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  For now we will continue the Enhertu and Faslodex but keep this in mind for the future.  Nausea/Vomiting She is having this daily despite adding Emend and taking regular doses of Zofran, Compazine, and Ativan. I will therefore add olanzapine and start at 5mg  at bedtime, but we may go up to 10mg . I will also add Dexamethazone 4mg  to take twice daily for 3 to 5 days after chemo.   Plan:  She completed radiation of the left hip on 12/25/2022. Patient states that she feels poorly and complains of back pain, on/off diarrhea, nausea, vomiting at least once a day since her last treatment. I will prescribe olanzapine 5mg  to take at bedtime. I will also prescribe Dexamethazone 4mg  twice daily to take for 3 to 5 days after chemotherapy.  Her WBC is 3.2 with an ANC of 2000, hemoglobin 12.0, and platelet count 272,000. She had a elevated glucose of 137, low potassium of 2.8, low calcium of 8.1, and low protein of 6.0. Her day 1 cycle 5 of Enhertu is scheduled on 01/04/2023 and I will add IV potassium on that same day. In one week we will redraw a CBC and CMP on 01/09/2023 and she will probably need IV potassium on 01/11/2023. I will plan CT scans sometime this summer, in July or August. I will see her in 3 weeks  with CBC, CMP, and CA 27.29.  Her next echocardiogram will be due in early June, 2024. The patient understands the plans discussed today and is in agreement with them.  She knows to contact our office if she develops concerns prior to her next appointment.   I provided 20 minutes of face-to-face time during this encounter and > 50% was spent counseling as documented under my assessment and plan.    Dellia Beckwith, MD  [facility] AT [facility] [address] [place] [address] Dept: [phone] Dept Fax: [phone]   No orders of the defined types were placed in this encounter.    CHIEF COMPLAINT:  CC: A 52 year old female with history of breast cancer  Current Treatment:  THP; will proceed with HP  INTERVAL HISTORY:  Tieara is here today for repeat clinical assessment for her breast cancer metastatic to bone.  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 I mentioned that there are now 2 types of medication that target this type of mutation that we can use if the time comes. She completed radiation at the left hip on 12/25/2022. Patient states  that she feels poorly and complains of back pain, on/off diarrhea, nausea, vomiting at least once a day since her last treatment. I will prescribe olanzapine 5mg  to take at bedtime. I will also prescribe Dexamethazone 4mg  twice daily to take for 3 to 5 days after chemotherapy.  Her WBC is 3.2 with an ANC of 2000, hemoglobin 12.0, and platelet count 272,000. She had a elevated glucose of 137, low potassium of 2.8, low calcium of 8.1, and low protein of 6.0. Her day 1 cycle 5 of Enhertu is scheduled on 01/04/2023 and I will add IV potassium on that same day. In one week we will redraw a CBC and CMP on 01/09/2023 and she will probably need IV potassium on 01/11/2023. I will plan CT scans sometime this summer, in July or August. I will see her in 3 weeks with  CBC, CMP, and CA 27.29. She denies signs of infection such as sore throat, sinus drainage, cough, or urinary symptoms.  She denies fevers or recurrent chills. She denies pain. She denies chest pain, dyspnea or cough. Her appetite is poor since nausea and vomiting and her weight has decreased 2 pounds over last 21 days .She is accompanied today by her husband as usual.    I have reviewed the past medical history, past surgical history, social history and family history with the patient and they are unchanged from previous note.  ALLERGIES:  has No Known Allergies.  MEDICATIONS:  Current Outpatient Medications  Medication Sig Dispense Refill   albuterol (VENTOLIN HFA) 108 (90 Base) MCG/ACT inhaler Inhale 2 puffs into the lungs every 6 (six) hours as needed for shortness of breath.     ARIPiprazole (ABILIFY) 5 MG tablet Take 2.5 mg by mouth daily.     aspirin 81 MG EC tablet Take 81 mg by mouth daily. Swallow whole.     atorvastatin (LIPITOR) 20 MG tablet Take 1 tablet (20 mg total) by mouth daily. 90 tablet 0   augmented betamethasone dipropionate (DIPROLENE-AF) 0.05 % cream      calcium carbonate (TUMS - DOSED IN MG ELEMENTAL CALCIUM) 500 MG chewable tablet Chew 1 tablet by mouth daily. 4 gummies daily/ not tablet form .     CLENPIQ 10-3.5-12 MG-GM -GM/160ML SOLN      desvenlafaxine (PRISTIQ) 50 MG 24 hr tablet Take 50 mg by mouth daily.     dexamethasone (DECADRON) 4 MG tablet Take 1 tablet (4 mg total) by mouth 2 (two) times daily. For 5 days after chemotherapy 20 tablet 2   diphenoxylate-atropine (LOMOTIL) 2.5-0.025 MG tablet TAKE TWO TABLETS BY MOUTH FOUR TIMES A DAY AS NEEDED FOR DIARRHEA OR LOOSE STOOLS 100 tablet 1   FARXIGA 10 MG TABS tablet Take 1 tablet (10 mg total) by mouth daily. 30 tablet 11   furosemide (LASIX) 20 MG tablet Take 1 tablet (20 mg total) by mouth daily. (Patient taking differently: Take 20 mg by mouth as needed.) 30 tablet 0   gabapentin (NEURONTIN) 300 MG capsule  Take 1 capsule (300 mg total) by mouth at bedtime. 30 capsule 1   labetalol (NORMODYNE) 200 MG tablet Take 1 tablet (200 mg total) by mouth 2 (two) times daily. 180 tablet 3   loratadine (CLARITIN) 10 MG tablet TAKE ONE TABLET BY MOUTH DAILY 30 tablet 2   LORazepam (ATIVAN) 1 MG tablet TAKE ONE TABLET BY MOUTH EVERY 8 HOURS 30 tablet 1   magic mouthwash (lidocaine, diphenhydrAMINE, alum & mag hydroxide) suspension Take 5 mLs by  mouth every 3 (three) hours as needed. Swish and spit; for throat/mouth pain     metoCLOPramide (REGLAN) 10 MG tablet Take 1 tablet (10 mg total) by mouth 4 (four) times daily -  before meals and at bedtime. 100 tablet 5   naloxone (NARCAN) nasal spray 4 mg/0.1 mL One spray in nostril as needed, may repeat every 2 to 3 minutes until medical assistance becomes available 4 each 1   nitroGLYCERIN (NITROSTAT) 0.4 MG SL tablet Place 0.4 mg under the tongue every 5 (five) minutes as needed for chest pain.     nystatin (MYCOSTATIN/NYSTOP) powder Apply 1 Application topically 2 (two) times daily.     OLANZapine (ZYPREXA) 5 MG tablet Take 1 tablet (5 mg total) by mouth at bedtime. 30 tablet 5   ondansetron (ZOFRAN) 8 MG tablet Take 1 tablet (8 mg total) by mouth every 8 (eight) hours as needed for nausea or vomiting. 60 tablet 5   Oxycodone HCl 10 MG TABS Take 1 tablet (10 mg total) by mouth every 4 (four) hours as needed. for pain 180 tablet 0   OZEMPIC, 0.25 OR 0.5 MG/DOSE, 2 MG/3ML SOPN Inject into the skin.     potassium chloride SA (KLOR-CON M) 20 MEQ tablet Take 1 tablet (20 mEq total) by mouth 2 (two) times daily. 60 tablet 0   prochlorperazine (COMPAZINE) 10 MG tablet TAKE ONE TABLET BY MOUTH EVERY 6 HOURS AS NEEDED FOR NAUSEA/VOMITING 90 tablet 3   valsartan (DIOVAN) 40 MG tablet TAKE ONE TABLET BY MOUTH EVERY DAY 30 tablet 2   Current Facility-Administered Medications  Medication Dose Route Frequency Provider Last Rate Last Admin   0.9 %  sodium chloride infusion  500 mL  Intravenous Once Lynann Bologna, MD       technetium sestamibi generic (CARDIOLITE) injection 10.5 millicurie  10.5 millicurie Intravenous Once PRN Revankar, Aundra Dubin, MD        HISTORY OF PRESENT ILLNESS:   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 (HCC)   07/23/2013 Cancer Staging   Staging form: Breast, AJCC 7th Edition - Clinical stage from 07/23/2013: Stage IIB (T2, N1, M0) - Signed by Dellia Beckwith, MD on 07/04/2020 Prognostic indicators: Pos LVI   09/22/2021 - 04/06/2022 Chemotherapy   Patient is on Treatment Plan : BREAST  Trastuzumab + Pertuzumab q21d      09/22/2021 - 09/21/2022 Chemotherapy   Patient is on Treatment Plan : BREAST Trastuzumab + Pertuzumab q21d     10/12/2022 -  Chemotherapy   Patient is on Treatment Plan : BREAST METASTATIC Fam-Trastuzumab Deruxtecan-nxki (Enhertu) (5.4) q21d     Malignant neoplasm metastatic to bone (HCC)  06/28/2021 Initial Diagnosis   Bone metastases (HCC)   09/22/2021 - 04/06/2022 Chemotherapy   Patient is on Treatment Plan : BREAST  Trastuzumab + Pertuzumab q21d      09/22/2021 - 09/21/2022 Chemotherapy   Patient is on Treatment Plan : BREAST Trastuzumab + Pertuzumab q21d     05/11/2022 Imaging   CT chest, abdomen and pelvis:  IMPRESSION:  1. Widespread bony metastatic disease shows increased sclerosis  since previous imaging. This is likely related to response to  therapy in the interval. There is a single lesion along the LEFT  iliac which shows continued lytic change and warrants attention on  follow-up  2. Interval development of pathologic fractures at in the upper  thoracic spine and at the thoracolumbar junction with associated  kyphosis.  3. No signs of solid organ or nodal metastatic disease.  4. Marked elevation of the LEFT hemidiaphragm as on the prior PET.  5. Aortic  atherosclerosis.    10/12/2022 -  Chemotherapy   Patient is on Treatment Plan : BREAST METASTATIC Fam-Trastuzumab Deruxtecan-nxki (Enhertu) (5.4) q21d         REVIEW OF SYSTEMS:  Review of Systems  Constitutional:  Positive for appetite change. Negative for chills, diaphoresis, fatigue, fever and unexpected weight change.  HENT:  Negative.  Negative for hearing loss, lump/mass, mouth sores, nosebleeds, sore throat, tinnitus, trouble swallowing and voice change.   Eyes: Negative.  Negative for eye problems and icterus.  Respiratory:  Positive for shortness of breath (mild). Negative for chest tightness, cough, hemoptysis and wheezing.   Cardiovascular: Negative.  Negative for chest pain, leg swelling and palpitations.  Gastrointestinal:  Positive for diarrhea and nausea. Negative for abdominal distention, abdominal pain, blood in stool, constipation, rectal pain and vomiting.  Endocrine: Negative.   Genitourinary: Negative.  Negative for bladder incontinence, difficulty urinating, dyspareunia, dysuria, frequency, hematuria, menstrual problem, nocturia, pelvic pain, vaginal bleeding and vaginal discharge.   Musculoskeletal:  Positive for back pain (lower back pain). Negative for arthralgias, flank pain, gait problem, myalgias, neck pain and neck stiffness.  Skin:  Negative for itching, rash and wound.  Neurological: Negative.  Negative for dizziness, extremity weakness, gait problem, headaches, light-headedness, numbness, seizures and speech difficulty.  Hematological: Negative.  Negative for adenopathy. Does not bruise/bleed easily.  Psychiatric/Behavioral: Negative.  Negative for confusion, decreased concentration, depression, sleep disturbance and suicidal ideas. The patient is not nervous/anxious.    VITALS:  Blood pressure 105/63, pulse (!) 105, temperature 98.7 F (37.1 C), temperature source Oral, resp. rate 16, height 4\' 11"  (1.499 m), weight 138 lb 12.8 oz (63 kg), last menstrual  period 08/18/2013, SpO2 95 %.  Wt Readings from Last 3 Encounters:  01/15/23 139 lb 1.9 oz (63.1 kg)  01/04/23 140 lb (63.5 kg)  01/02/23 138 lb 12.8 oz (63 kg)    Body mass index is 28.03 kg/m.  Performance status (ECOG): 1 - Symptomatic but completely ambulatory  PHYSICAL EXAM:  Physical Exam Vitals and nursing note reviewed. Exam conducted with a chaperone present.  Constitutional:      General: She is not in acute distress.    Appearance: Normal appearance. She is normal weight. She is not ill-appearing, toxic-appearing or diaphoretic.  HENT:     Head: Normocephalic and atraumatic.     Right Ear: Tympanic membrane, ear canal and external ear normal. There is no impacted cerumen.     Left Ear: Tympanic membrane, ear canal and external ear normal. There is no impacted cerumen.     Nose: Nose normal. No congestion or rhinorrhea.     Mouth/Throat:     Mouth: Mucous membranes are moist.     Pharynx: Oropharynx is clear. No oropharyngeal exudate or posterior oropharyngeal erythema.  Eyes:     General: No scleral icterus.       Right eye: No discharge.        Left eye: No discharge.     Extraocular Movements: Extraocular movements intact.     Conjunctiva/sclera: Conjunctivae normal.     Pupils: Pupils are equal, round, and reactive to light.  Neck:     Vascular: No carotid bruit.  Cardiovascular:     Rate and Rhythm: Normal rate and regular rhythm.  Pulses: Normal pulses.     Heart sounds: Normal heart sounds. No murmur heard.    No friction rub. No gallop.  Pulmonary:     Effort: Pulmonary effort is normal. No respiratory distress.     Breath sounds: Normal breath sounds. No stridor or decreased air movement. No wheezing, rhonchi or rales.  Chest:     Chest wall: No tenderness.     Comments: Well healed scar in the upper outer of the left breast. Firmness diffusely in the left breast consistent with post radiation changes and the thickening of skin in both breast.   Abdominal:     General: Bowel sounds are normal. There is no distension.     Palpations: Abdomen is soft. There is no hepatomegaly, splenomegaly or mass.     Tenderness: There is no abdominal tenderness. There is no right CVA tenderness, left CVA tenderness, guarding or rebound.     Hernia: No hernia is present.  Musculoskeletal:        General: No swelling, tenderness, deformity or signs of injury. Normal range of motion.     Cervical back: Normal range of motion and neck supple. No rigidity or tenderness.     Right lower leg: No edema.     Left lower leg: No edema.  Lymphadenopathy:     Cervical: No cervical adenopathy.     Right cervical: No superficial, deep or posterior cervical adenopathy.    Left cervical: No superficial, deep or posterior cervical adenopathy.     Upper Body:     Right upper body: No supraclavicular, axillary or pectoral adenopathy.     Left upper body: No supraclavicular, axillary or pectoral adenopathy.  Skin:    General: Skin is warm and dry.     Coloration: Skin is not jaundiced or pale.     Findings: No bruising, erythema, lesion or rash.     Comments: Bilateral lower extremities are red and swollen with induration, skin is dry and peeling but overall improved.   Neurological:     General: No focal deficit present.     Mental Status: She is alert and oriented to person, place, and time. Mental status is at baseline.     Cranial Nerves: No cranial nerve deficit.     Sensory: No sensory deficit.     Motor: No weakness.     Coordination: Coordination normal.     Gait: Gait normal.     Deep Tendon Reflexes: Reflexes normal.  Psychiatric:        Mood and Affect: Mood normal.        Behavior: Behavior normal.        Thought Content: Thought content normal.        Judgment: Judgment normal.    LABORATORY DATA:  I have reviewed the data as listed  Component Ref Range & Units 1 d ago (01/01/23) 3 wk ago (12/11/22) 2 mo ago (10/31/22)  WBC Count 4.0 -  10.5 K/uL 3.2 Low  5.0 4.1  RBC 3.87 - 5.11 MIL/uL 4.16 4.20 4.44  Hemoglobin 12.0 - 15.0 g/dL 16.1 09.6 Low  04.5 Low   HCT 36.0 - 46.0 % 38.9 37.9 38.4  MCV 80.0 - 100.0 fL 93.5 90.2 86.5  MCH 26.0 - 34.0 pg 28.8 28.3 26.1  MCHC 30.0 - 36.0 g/dL 40.9 81.1 91.4  RDW 78.2 - 15.5 % 18.3 High  18.7 High  20.3 High   Platelet Count 150 - 400 K/uL 272 274  Component Ref Range & Units 1 d ago (01/01/23) 3 wk ago (12/11/22) 1 mo ago (11/21/22) 2 mo ago (10/31/22)  Sodium 135 - 145 mmol/L 139 139 138 141  Potassium 3.5 - 5.1 mmol/L 2.8 Low  3.4 Low  3.6 3.3 Low   Chloride 98 - 111 mmol/L 104 103 101 102  CO2 22 - 32 mmol/L 27 29 29 31   Glucose, Bld 70 - 99 mg/dL 161 High  91 CM 89 CM 83 CM  BUN 6 - 20 mg/dL 10 14 14 10   Creatinine 0.44 - 1.00 mg/dL 0.96 0.45 4.09 8.11  Calcium 8.9 - 10.3 mg/dL 8.1 Low  8.9 8.4 Low  8.9  Total Protein 6.5 - 8.1 g/dL 6.0 Low  6.4 Low  6.3 Low  6.5  Albumin 3.5 - 5.0 g/dL 3.5 3.6 3.6 3.8  AST 15 - 41 U/L 21 24 22 19   ALT 0 - 44 U/L 19 28 24 21   Alkaline Phosphatase 38 - 126 U/L 52 70 63 68   Component Ref Range & Units 2 wk ago (11/21/22) 2 mo ago (10/09/22) 3 mo ago (08/29/22) 5 mo ago (06/28/22) 1 yr ago (09/06/21) 1 yr ago (07/20/21)  CA 27.29 0.0 - 38.6 U/mL 46.2 High  53.8 High  CM 57.7 High  CM 50.9 High  CM 106.6 High  CM 75.6 High  C      Component Value Date/Time   NA 134 (L) 01/14/2023 1454   NA 136 (A) 10/22/2022 0000   K 3.0 (L) 01/14/2023 1454   CL 99 01/14/2023 1454   CO2 28 01/14/2023 1454   GLUCOSE 116 (H) 01/14/2023 1454   BUN 16 01/14/2023 1454   BUN 19 10/22/2022 0000   CREATININE 0.61 01/14/2023 1454   CREATININE 0.60 01/01/2023 1308   CALCIUM 8.0 (L) 01/14/2023 1454   PROT 5.4 (L) 01/14/2023 1454   ALBUMIN 3.1 (L) 01/14/2023 1454   AST 18 01/14/2023 1454   AST 21 01/01/2023 1308   ALT 19 01/14/2023 1454   ALT 19 01/01/2023 1308   ALKPHOS 55 01/14/2023 1454   BILITOT 0.3 01/14/2023 1454    BILITOT 0.3 01/01/2023 1308   GFRNONAA >60 01/14/2023 1454   GFRNONAA >60 01/01/2023 1308   No results found for: "SPEP", "UPEP"  Lab Results  Component Value Date   WBC 8.1 01/14/2023   NEUTROABS 6.8 01/14/2023   HGB 12.1 01/14/2023   HCT 37.4 01/14/2023   MCV 93.5 01/14/2023   PLT 257 01/14/2023     Chemistry      Component Value Date/Time   NA 134 (L) 01/14/2023 1454   NA 136 (A) 10/22/2022 0000   K 3.0 (L) 01/14/2023 1454   CL 99 01/14/2023 1454   CO2 28 01/14/2023 1454   BUN 16 01/14/2023 1454   BUN 19 10/22/2022 0000   CREATININE 0.61 01/14/2023 1454   CREATININE 0.60 01/01/2023 1308   GLU 110 10/22/2022 0000      Component Value Date/Time   CALCIUM 8.0 (L) 01/14/2023 1454   ALKPHOS 55 01/14/2023 1454   AST 18 01/14/2023 1454   AST 21 01/01/2023 1308   ALT 19 01/14/2023 1454   ALT 19 01/01/2023 1308   BILITOT 0.3 01/14/2023 1454   BILITOT 0.3 01/01/2023 1308      RADIOGRAPHIC STUDIES:       I,Jasmine M Lassiter,acting as a scribe for Dellia Beckwith, MD.,have documented all relevant documentation on the behalf of Dellia Beckwith, MD,as directed by  Dellia Beckwith, MD while in the presence of Dellia Beckwith, MD.

## 2023-01-02 ENCOUNTER — Encounter: Payer: Self-pay | Admitting: Oncology

## 2023-01-02 ENCOUNTER — Inpatient Hospital Stay (INDEPENDENT_AMBULATORY_CARE_PROVIDER_SITE_OTHER): Payer: BC Managed Care – PPO | Admitting: Oncology

## 2023-01-02 ENCOUNTER — Other Ambulatory Visit: Payer: Self-pay | Admitting: Oncology

## 2023-01-02 VITALS — BP 105/63 | HR 105 | Temp 98.7°F | Resp 16 | Ht 59.0 in | Wt 138.8 lb

## 2023-01-02 DIAGNOSIS — C7951 Secondary malignant neoplasm of bone: Secondary | ICD-10-CM | POA: Diagnosis not present

## 2023-01-02 DIAGNOSIS — M8580 Other specified disorders of bone density and structure, unspecified site: Secondary | ICD-10-CM | POA: Diagnosis not present

## 2023-01-02 DIAGNOSIS — Z170 Estrogen receptor positive status [ER+]: Secondary | ICD-10-CM | POA: Diagnosis not present

## 2023-01-02 DIAGNOSIS — C50412 Malignant neoplasm of upper-outer quadrant of left female breast: Secondary | ICD-10-CM | POA: Diagnosis not present

## 2023-01-02 DIAGNOSIS — Z780 Asymptomatic menopausal state: Secondary | ICD-10-CM

## 2023-01-02 MED ORDER — DEXAMETHASONE 4 MG PO TABS
4.0000 mg | ORAL_TABLET | Freq: Two times a day (BID) | ORAL | 2 refills | Status: DC
Start: 2023-01-02 — End: 2023-08-26

## 2023-01-02 MED ORDER — OLANZAPINE 5 MG PO TABS
5.0000 mg | ORAL_TABLET | Freq: Every day | ORAL | 5 refills | Status: DC
Start: 2023-01-02 — End: 2023-03-06

## 2023-01-03 ENCOUNTER — Telehealth: Payer: Self-pay | Admitting: Oncology

## 2023-01-03 ENCOUNTER — Other Ambulatory Visit: Payer: Self-pay

## 2023-01-03 DIAGNOSIS — Z5111 Encounter for antineoplastic chemotherapy: Secondary | ICD-10-CM | POA: Diagnosis not present

## 2023-01-03 LAB — MAGNESIUM: Magnesium: 1.6 mg/dL — ABNORMAL LOW (ref 1.7–2.4)

## 2023-01-03 MED FILL — Fosaprepitant Dimeglumine For IV Infusion 150 MG (Base Eq): INTRAVENOUS | Qty: 5 | Status: AC

## 2023-01-03 MED FILL — Dexamethasone Sodium Phosphate Inj 100 MG/10ML: INTRAMUSCULAR | Qty: 1 | Status: AC

## 2023-01-03 NOTE — Telephone Encounter (Signed)
Patient has been scheduled. Aware of appt date and time  Scheduling Message Entered by Gery Pray H on 01/02/2023 at  4:57 PM Priority: Routine <No visit type provided>  Department: [facility]  Provider:  Scheduling Notes:  Rt 3 weeks with labs and infusion 2 days later    RT 6 week with labs and infusion 2 days later

## 2023-01-04 ENCOUNTER — Inpatient Hospital Stay: Payer: BC Managed Care – PPO

## 2023-01-04 ENCOUNTER — Other Ambulatory Visit: Payer: Self-pay

## 2023-01-04 DIAGNOSIS — C7951 Secondary malignant neoplasm of bone: Secondary | ICD-10-CM

## 2023-01-04 DIAGNOSIS — Z170 Estrogen receptor positive status [ER+]: Secondary | ICD-10-CM

## 2023-01-04 DIAGNOSIS — Z5111 Encounter for antineoplastic chemotherapy: Secondary | ICD-10-CM | POA: Diagnosis not present

## 2023-01-04 MED ORDER — SODIUM CHLORIDE 0.9 % IV SOLN
150.0000 mg | Freq: Once | INTRAVENOUS | Status: AC
  Administered 2023-01-04: 150 mg via INTRAVENOUS
  Filled 2023-01-04: qty 150

## 2023-01-04 MED ORDER — PALONOSETRON HCL INJECTION 0.25 MG/5ML
0.2500 mg | Freq: Once | INTRAVENOUS | Status: AC
  Administered 2023-01-04: 0.25 mg via INTRAVENOUS
  Filled 2023-01-04: qty 5

## 2023-01-04 MED ORDER — POTASSIUM CHLORIDE 10 MEQ/100ML IV SOLN
10.0000 meq | INTRAVENOUS | Status: AC
  Administered 2023-01-04 (×3): 10 meq via INTRAVENOUS
  Filled 2023-01-04 (×3): qty 100

## 2023-01-04 MED ORDER — DEXTROSE 5 % IV SOLN: Freq: Once | INTRAVENOUS | Status: AC

## 2023-01-04 MED ORDER — DIPHENHYDRAMINE HCL 25 MG PO CAPS
50.0000 mg | ORAL_CAPSULE | Freq: Once | ORAL | Status: DC
  Filled 2023-01-04: qty 2

## 2023-01-04 MED ORDER — FULVESTRANT 250 MG/5ML IM SOSY
500.0000 mg | PREFILLED_SYRINGE | Freq: Once | INTRAMUSCULAR | Status: AC
  Administered 2023-01-04: 500 mg via INTRAMUSCULAR
  Filled 2023-01-04: qty 10

## 2023-01-04 MED ORDER — SODIUM CHLORIDE 0.9 % IV SOLN
10.0000 mg | Freq: Once | INTRAVENOUS | Status: AC
  Administered 2023-01-04: 10 mg via INTRAVENOUS
  Filled 2023-01-04: qty 10

## 2023-01-04 MED ORDER — ACETAMINOPHEN 325 MG PO TABS
650.0000 mg | ORAL_TABLET | Freq: Once | ORAL | Status: DC
  Filled 2023-01-04: qty 2

## 2023-01-04 MED ORDER — MAGNESIUM SULFATE 2 GM/50ML IV SOLN
2.0000 g | Freq: Once | INTRAVENOUS | Status: AC
  Administered 2023-01-04: 2 g via INTRAVENOUS
  Filled 2023-01-04: qty 50

## 2023-01-04 MED ORDER — SODIUM CHLORIDE 0.9% FLUSH
10.0000 mL | INTRAVENOUS | Status: DC | PRN
  Administered 2023-01-04: 10 mL

## 2023-01-04 MED ORDER — HEPARIN SOD (PORK) LOCK FLUSH 100 UNIT/ML IV SOLN
500.0000 [IU] | Freq: Once | INTRAVENOUS | Status: AC | PRN
  Administered 2023-01-04: 500 [IU]

## 2023-01-04 MED ORDER — FAM-TRASTUZUMAB DERUXTECAN-NXKI CHEMO 100 MG IV SOLR
5.4000 mg/kg | Freq: Once | INTRAVENOUS | Status: AC
  Administered 2023-01-04: 340 mg via INTRAVENOUS
  Filled 2023-01-04: qty 17

## 2023-01-04 NOTE — Patient Instructions (Signed)
Fulvestrant Injection What is this medication? FULVESTRANT (ful VES trant) treats breast cancer. It works by blocking the hormone estrogen in breast tissue, which prevents breast cancer cells from spreading or growing. This medicine may be used for other purposes; ask your health care provider or pharmacist if you have questions. COMMON BRAND NAME(S): FASLODEX What should I tell my care team before I take this medication? They need to know if you have any of these conditions: Bleeding disorder Liver disease Low blood cell levels, such as low white cells, red cells, and platelets An unusual or allergic reaction to fulvestrant, other medications, foods, dyes, or preservatives Pregnant or trying to get pregnant Breast-feeding How should I use this medication? This medication is injected into a muscle. It is given by your care team in a hospital or clinic setting. Talk to your care team about the use of this medication in children. Special care may be needed. Overdosage: If you think you have taken too much of this medicine contact a poison control center or emergency room at once. NOTE: This medicine is only for you. Do not share this medicine with others. What if I miss a dose? Keep appointments for follow-up doses. It is important not to miss your dose. Call your care team if you are unable to keep an appointment. What may interact with this medication? Certain medications that prevent or treat blood clots, such as warfarin, enoxaparin, dalteparin, apixaban, dabigatran, rivaroxaban This list may not describe all possible interactions. Give your health care provider a list of all the medicines, herbs, non-prescription drugs, or dietary supplements you use. Also tell them if you smoke, drink alcohol, or use illegal drugs. Some items may interact with your medicine. What should I watch for while using this medication? Your condition will be monitored carefully while you are receiving this  medication. You may need blood work while taking this medication. Talk to your care team if you may be pregnant. Serious birth defects can occur if you take this medication during pregnancy and for 1 year after the last dose. You will need a negative pregnancy test before starting this medication. Contraception is recommended while taking this medication and for 1 year after the last dose. Your care team can help you find the option that works for you. Do not breastfeed while taking this medication and for 1 year after the last dose. This medication may cause infertility. Talk to your care team if you are concerned about your fertility. What side effects may I notice from receiving this medication? Side effects that you should report to your care team as soon as possible: Allergic reactions or angioedema--skin rash, itching or hives, swelling of the face, eyes, lips, tongue, arms, or legs, trouble swallowing or breathing Pain, tingling, or numbness in the hands or feet Side effects that usually do not require medical attention (report to your care team if they continue or are bothersome): Bone, joint, or muscle pain Constipation Headache Hot flashes Nausea Pain, redness, or irritation at injection site Unusual weakness or fatigue This list may not describe all possible side effects. Call your doctor for medical advice about side effects. You may report side effects to FDA at 1-800-FDA-1088. Where should I keep my medication? This medication is given in a hospital or clinic. It will not be stored at home. NOTE: This sheet is a summary. It may not cover all possible information. If you have questions about this medicine, talk to your doctor, pharmacist, or health care provider.  2024 Elsevier/Gold Standard (2021-12-12 00:00:00) Fam-Trastuzumab Deruxtecan Injection What is this medication? FAM-TRASTUZUMAB DERUXTECAN (fam-tras TOOZ eu mab DER ux TEE kan) treats some types of cancer. It works by  blocking a protein that causes cancer cells to grow and multiply. This helps to slow or stop the spread of cancer cells. This medicine may be used for other purposes; ask your health care provider or pharmacist if you have questions. COMMON BRAND NAME(S): ENHERTU What should I tell my care team before I take this medication? They need to know if you have any of these conditions: Heart disease Heart failure Infection, especially a viral infection, such as chickenpox, cold sores, or herpes Liver disease Lung or breathing disease, such as asthma or COPD An unusual or allergic reaction to fam-trastuzumab deruxtecan, other medications, foods, dyes, or preservatives Pregnant or trying to get pregnant Breast-feeding How should I use this medication? This medication is injected into a vein. It is given by your care team in a hospital or clinic setting. A special MedGuide will be given to you before each treatment. Be sure to read this information carefully each time. Talk to your care team about the use of this medication in children. Special care may be needed. Overdosage: If you think you have taken too much of this medicine contact a poison control center or emergency room at once. NOTE: This medicine is only for you. Do not share this medicine with others. What if I miss a dose? It is important not to miss your dose. Call your care team if you are unable to keep an appointment. What may interact with this medication? Interactions are not expected. This list may not describe all possible interactions. Give your health care provider a list of all the medicines, herbs, non-prescription drugs, or dietary supplements you use. Also tell them if you smoke, drink alcohol, or use illegal drugs. Some items may interact with your medicine. What should I watch for while using this medication? Visit your care team for regular checks on your progress. Tell your care team if your symptoms do not start to get  better or if they get worse. Your condition will be monitored carefully while you are receiving this medication. Do not become pregnant while taking this medication or for 7 months after stopping it. Women should inform their care team if they wish to become pregnant or think they might be pregnant. Men should not father a child while taking this medication and for 4 months after stopping it. There is potential for serious side effects to an unborn child. Talk to your care team for more information. Do not breast-feed an infant while taking this medication or for 7 months after the last dose. This medication has caused decreased sperm counts in some men. This may make it more difficult to father a child. Talk to your care team if you are concerned about your fertility. This medication may increase your risk to bruise or bleed. Call your care team if you notice any unusual bleeding. Be careful brushing or flossing your teeth or using a toothpick because you may get an infection or bleed more easily. If you have any dental work done, tell your dentist you are receiving this medication. This medication may cause dry eyes and blurred vision. If you wear contact lenses, you may feel some discomfort. Lubricating eye drops may help. See your care team if the problem does not go away or is severe. This medication may increase your risk of getting an  infection. Call your care team for advice if you get a fever, chills, sore throat, or other symptoms of a cold or flu. Do not treat yourself. Try to avoid being around people who are sick. Avoid taking medications that contain aspirin, acetaminophen, ibuprofen, naproxen, or ketoprofen unless instructed by your care team. These medications may hide a fever. What side effects may I notice from receiving this medication? Side effects that you should report to your care team as soon as possible: Allergic reactions--skin rash, itching, hives, swelling of the face, lips,  tongue, or throat Dry cough, shortness of breath or trouble breathing Infection--fever, chills, cough, sore throat, wounds that don't heal, pain or trouble when passing urine, general feeling of discomfort or being unwell Heart failure--shortness of breath, swelling of the ankles, feet, or hands, sudden weight gain, unusual weakness or fatigue Unusual bruising or bleeding Side effects that usually do not require medical attention (report these to your care team if they continue or are bothersome): Constipation Diarrhea Hair loss Muscle pain Nausea Vomiting This list may not describe all possible side effects. Call your doctor for medical advice about side effects. You may report side effects to FDA at 1-800-FDA-1088. Where should I keep my medication? This medication is given in a hospital or clinic. It will not be stored at home. NOTE: This sheet is a summary. It may not cover all possible information. If you have questions about this medicine, talk to your doctor, pharmacist, or health care provider.  2024 Elsevier/Gold Standard (2021-05-16 00:00:00) Hypomagnesemia Hypomagnesemia is a condition in which the level of magnesium in the blood is too low. Magnesium is a mineral that is found in many foods. It is used in many different processes in the body. Hypomagnesemia can affect every organ in the body. In severe cases, it can cause life-threatening problems. What are the causes? This condition may be caused by: Not getting enough magnesium in your diet or not having enough healthy foods to eat (malnutrition). Problems with magnesium absorption in the intestines. Dehydration. Excessive use of alcohol. Vomiting. Severe or long-term (chronic) diarrhea. Some medicines, including medicines that make you urinate more often (diuretics). Certain diseases, such as kidney disease, diabetes, celiac disease, and overactive thyroid. What are the signs or symptoms? Symptoms of this condition  include: Loss of appetite, nausea, and vomiting. Involuntary shaking or trembling of a body part (tremor). Muscle weakness or tingling in the arms and legs. Sudden tightening of muscles (muscle spasms). Confusion. Psychiatric issues, such as: Depression and irritability. Psychosis. A feeling of fluttering of the heart (palpitations). Seizures. These symptoms are more severe if magnesium levels drop suddenly. How is this diagnosed? This condition may be diagnosed based on: Your symptoms and medical history. A physical exam. Blood and urine tests. How is this treated? Treatment depends on the cause and the severity of the condition. It may be treated by: Taking a magnesium supplement. This can be taken in pill form. If the condition is severe, magnesium is usually given through an IV. Making changes to your diet. You may be directed to eat foods that have a lot of magnesium, such as green leafy vegetables, peas, beans, and nuts. Not drinking alcohol. If you are struggling not to drink, ask your health care provider for help. Follow these instructions at home: Eating and drinking     Make sure that your diet includes foods with magnesium. Foods that have a lot of magnesium in them include: Green leafy vegetables, such as spinach and  broccoli. Beans and peas. Nuts and seeds, such as almonds and sunflower seeds. Whole grains, such as whole grain bread and fortified cereals. Drink fluids that contain salts and minerals (electrolytes), such as sports drinks, when you are active. Do not drink alcohol. General instructions Take over-the-counter and prescription medicines only as told by your health care provider. Take magnesium supplements as directed if your health care provider tells you to take them. Have your magnesium levels monitored as told by your health care provider. Keep all follow-up visits. This is important. Contact a health care provider if: You get worse instead of  better. Your symptoms return. Get help right away if: You develop severe muscle weakness. You have trouble breathing. You feel that your heart is racing. These symptoms may represent a serious problem that is an emergency. Do not wait to see if the symptoms will go away. Get medical help right away. Call your local emergency services (911 in the [place] Do not drive yourself to the hospital. Summary Hypomagnesemia is a condition in which the level of magnesium in the blood is too low. Hypomagnesemia can affect every organ in the body. Treatment may include eating more foods that contain magnesium, taking magnesium supplements, and not drinking alcohol. Have your magnesium levels monitored as told by your health care provider. This information is not intended to replace advice given to you by your health care provider. Make sure you discuss any questions you have with your health care provider. Document Revised: 12/27/2020 Document Reviewed: 12/27/2020 Elsevier Patient Education  2024 [employer] Inc. Hypokalemia Hypokalemia means that the amount of potassium in the blood is lower than normal. Potassium is a mineral (electrolyte) that helps regulate the amount of fluid in the body. It also stimulates muscle tightening (contraction) and helps nerves work properly. Normally, most of the body's potassium is inside cells, and only a very small amount is in the blood. Because the amount in the blood is so small, minor changes to potassium levels in the blood can be life-threatening. What are the causes? This condition may be caused by: Antibiotic medicine. Diarrhea or vomiting. Taking too much of a medicine that helps you have a bowel movement (laxative) can cause diarrhea and lead to hypokalemia. Chronic kidney disease (CKD). Medicines that help the body get rid of excess fluid (diuretics). Eating disorders, such as anorexia or bulimia. Low magnesium levels in the body. Sweating a lot. What are  the signs or symptoms? Symptoms of this condition include: Weakness. Constipation. Fatigue. Muscle cramps. Mental confusion. Skipped heartbeats or irregular heartbeat (palpitations). Tingling or numbness. How is this diagnosed? This condition is diagnosed with a blood test. How is this treated? This condition may be treated by: Taking potassium supplements. Adjusting the medicines that you take. Eating more foods that contain a lot of potassium. If your potassium level is very low, you may need to get potassium through an IV and be monitored in the hospital. Follow these instructions at home: Eating and drinking  Eat a healthy diet. A healthy diet includes fresh fruits and vegetables, whole grains, healthy fats, and lean proteins. If told, eat more foods that contain a lot of potassium. These include: Nuts, such as peanuts and pistachios. Seeds, such as sunflower seeds and pumpkin seeds. Peas, lentils, and lima beans. Whole grain and bran cereals and breads. Fresh fruits and vegetables, such as apricots, avocado, bananas, cantaloupe, kiwi, oranges, tomatoes, asparagus, and potatoes. Juices, such as orange, tomato, and prune. Lean meats, including fish. Milk and  milk products, such as yogurt. General instructions Take over-the-counter and prescription medicines only as told by your health care provider. This includes vitamins, natural food products, and supplements. Keep all follow-up visits. This is important. Contact a health care provider if: You have weakness that gets worse. You feel your heart pounding or racing. You vomit. You have diarrhea. You have diabetes and you have trouble keeping your blood sugar in your target range. Get help right away if: You have chest pain. You have shortness of breath. You have vomiting or diarrhea that lasts for more than 2 days. You faint. These symptoms may be an emergency. Get help right away. Call 911. Do not wait to see if the  symptoms will go away. Do not drive yourself to the hospital. Summary Hypokalemia means that the amount of potassium in the blood is lower than normal. This condition is diagnosed with a blood test. Hypokalemia may be treated by taking potassium supplements, adjusting the medicines that you take, or eating more foods that are high in potassium. If your potassium level is very low, you may need to get potassium through an IV and be monitored in the hospital. This information is not intended to replace advice given to you by your health care provider. Make sure you discuss any questions you have with your health care provider. Document Revised: 04/13/2021 Document Reviewed: 04/13/2021 Elsevier Patient Education  2024 [employer].

## 2023-01-08 LAB — BASIC METABOLIC PANEL
BUN: 11 (ref 4–21)
CO2: 29 — AB (ref 13–22)
Chloride: 103 (ref 99–108)
Creatinine: 0.5 (ref 0.5–1.1)
Glucose: 115
Potassium: 4.4 mEq/L (ref 3.5–5.1)
Sodium: 143 (ref 137–147)

## 2023-01-08 LAB — VITAMIN B12: Vitamin B-12: 344

## 2023-01-08 LAB — COMPREHENSIVE METABOLIC PANEL
Albumin: 3.6 (ref 3.5–5.0)
Calcium: 8.6 — AB (ref 8.7–10.7)

## 2023-01-08 LAB — IRON,TIBC AND FERRITIN PANEL: Iron: 76

## 2023-01-08 LAB — HEPATIC FUNCTION PANEL
ALT: 17 U/L (ref 7–35)
AST: 12 — AB (ref 13–35)
Alkaline Phosphatase: 69 (ref 25–125)
Bilirubin, Total: 0.3

## 2023-01-08 LAB — CBC AND DIFFERENTIAL
HCT: 40 (ref 36–46)
Hemoglobin: 12.9 (ref 12.0–16.0)
Platelets: 264 10*3/uL (ref 150–400)
WBC: 6.7

## 2023-01-08 LAB — LIPID PANEL
Cholesterol: 130 (ref 0–200)
HDL: 61 (ref 35–70)
LDL Cholesterol: 48
LDl/HDL Ratio: 0.8
Triglycerides: 107 (ref 40–160)

## 2023-01-08 LAB — CBC: RBC: 4.37 (ref 3.87–5.11)

## 2023-01-08 LAB — VITAMIN D 25 HYDROXY (VIT D DEFICIENCY, FRACTURES): Vit D, 25-Hydroxy: 23.7

## 2023-01-08 LAB — HEMOGLOBIN A1C: Hemoglobin A1C: 5.8

## 2023-01-09 ENCOUNTER — Other Ambulatory Visit: Payer: BC Managed Care – PPO

## 2023-01-09 ENCOUNTER — Inpatient Hospital Stay: Payer: BC Managed Care – PPO

## 2023-01-09 DIAGNOSIS — Z170 Estrogen receptor positive status [ER+]: Secondary | ICD-10-CM

## 2023-01-09 DIAGNOSIS — Z5111 Encounter for antineoplastic chemotherapy: Secondary | ICD-10-CM | POA: Diagnosis not present

## 2023-01-09 LAB — COMPREHENSIVE METABOLIC PANEL
ALT: 19 U/L (ref 0–44)
AST: 15 U/L (ref 15–41)
Albumin: 3.5 g/dL (ref 3.5–5.0)
Alkaline Phosphatase: 53 U/L (ref 38–126)
Anion gap: 8 (ref 5–15)
BUN: 13 mg/dL (ref 6–20)
CO2: 29 mmol/L (ref 22–32)
Calcium: 8.7 mg/dL — ABNORMAL LOW (ref 8.9–10.3)
Chloride: 102 mmol/L (ref 98–111)
Creatinine, Ser: 0.59 mg/dL (ref 0.44–1.00)
GFR, Estimated: >60 mL/min (ref >60)
Glucose, Bld: 137 mg/dL — ABNORMAL HIGH (ref 70–99)
Potassium: 4.4 mmol/L (ref 3.5–5.1)
Sodium: 139 mmol/L (ref 135–145)
Total Bilirubin: 0.5 mg/dL (ref 0.3–1.2)
Total Protein: 6.3 g/dL — ABNORMAL LOW (ref 6.5–8.1)

## 2023-01-09 LAB — CBC WITH DIFFERENTIAL (CANCER CENTER ONLY)
Abs Immature Granulocytes: 0.04 10*3/uL (ref 0.00–0.07)
Basophils Absolute: 0 10*3/uL (ref 0.0–0.1)
Basophils Relative: 0 %
Eosinophils Absolute: 0 10*3/uL (ref 0.0–0.5)
Eosinophils Relative: 0 %
HCT: 40.9 % (ref 36.0–46.0)
Hemoglobin: 13 g/dL (ref 12.0–15.0)
Immature Granulocytes: 1 %
Lymphocytes Relative: 5 %
Lymphs Abs: 0.4 10*3/uL — ABNORMAL LOW (ref 0.7–4.0)
MCH: 29.7 pg (ref 26.0–34.0)
MCHC: 31.8 g/dL (ref 30.0–36.0)
MCV: 93.4 fL (ref 80.0–100.0)
Monocytes Absolute: 0.3 10*3/uL (ref 0.1–1.0)
Monocytes Relative: 4 %
Neutro Abs: 6.4 10*3/uL (ref 1.7–7.7)
Neutrophils Relative %: 90 %
Platelet Count: 267 10*3/uL (ref 150–400)
RBC: 4.38 MIL/uL (ref 3.87–5.11)
RDW: 17.7 % — ABNORMAL HIGH (ref 11.5–15.5)
WBC Count: 7.1 10*3/uL (ref 4.0–10.5)
nRBC: 0 % (ref 0.0–0.2)

## 2023-01-09 LAB — MAGNESIUM: Magnesium: 1.8 mg/dL (ref 1.7–2.4)

## 2023-01-10 ENCOUNTER — Telehealth: Payer: Self-pay

## 2023-01-10 ENCOUNTER — Other Ambulatory Visit: Payer: Self-pay | Admitting: Oncology

## 2023-01-10 DIAGNOSIS — C7951 Secondary malignant neoplasm of bone: Secondary | ICD-10-CM

## 2023-01-10 MED ORDER — ONDANSETRON HCL 8 MG PO TABS: 8.0000 mg | ORAL_TABLET | Freq: Three times a day (TID) | ORAL | 5 refills | Status: DC | PRN

## 2023-01-10 MED ORDER — METOCLOPRAMIDE HCL 10 MG PO TABS
10.0000 mg | ORAL_TABLET | Freq: Three times a day (TID) | ORAL | 5 refills | Status: DC
Start: 2023-01-10 — End: 2023-01-23

## 2023-01-10 NOTE — Telephone Encounter (Signed)
Pt has called to ask if she can have something stronger for nausea & vomiting with next treatment. She states she is coming in tomorrow for injection. Could we give her something IV then? She has started taking the Zyprexa. Has Ativan, Zofran and compazine @ home. Pt is not constipated.

## 2023-01-10 NOTE — Telephone Encounter (Signed)
Tries to take 2 tablets twice a day if possible. N/V is still there. The new medication has not helped much at all with the N/V.

## 2023-01-10 NOTE — Telephone Encounter (Signed)
-----   Message from Dellia Beckwith, MD sent at 01/10/2023  7:27 AM EDT ----- Regarding: labs K looks good now and mag is normal (will check again in 2 weeks) So I think she is just due for [facility] Friday, won't need IV's. But calcium only 8.7, better, but are we okay to give Xgeva?  Ask her how much calcium she is taking, we need to increase Hopefully her N/V is better?

## 2023-01-11 ENCOUNTER — Other Ambulatory Visit: Payer: Self-pay | Admitting: Hematology and Oncology

## 2023-01-11 ENCOUNTER — Inpatient Hospital Stay: Payer: BC Managed Care – PPO

## 2023-01-11 ENCOUNTER — Encounter: Payer: Self-pay | Admitting: Oncology

## 2023-01-11 DIAGNOSIS — R112 Nausea with vomiting, unspecified: Secondary | ICD-10-CM

## 2023-01-11 DIAGNOSIS — Z5111 Encounter for antineoplastic chemotherapy: Secondary | ICD-10-CM | POA: Diagnosis not present

## 2023-01-11 DIAGNOSIS — C7951 Secondary malignant neoplasm of bone: Secondary | ICD-10-CM

## 2023-01-11 DIAGNOSIS — C50412 Malignant neoplasm of upper-outer quadrant of left female breast: Secondary | ICD-10-CM

## 2023-01-11 MED ORDER — ONDANSETRON HCL 4 MG/2ML IJ SOLN
8.0000 mg | Freq: Once | INTRAMUSCULAR | Status: AC
  Administered 2023-01-11: 8 mg via INTRAVENOUS
  Filled 2023-01-11: qty 4

## 2023-01-11 MED ORDER — SODIUM CHLORIDE 0.9% FLUSH
10.0000 mL | INTRAVENOUS | Status: DC | PRN
  Administered 2023-01-11: 10 mL

## 2023-01-11 MED ORDER — DENOSUMAB 120 MG/1.7ML ~~LOC~~ SOLN
120.0000 mg | Freq: Once | SUBCUTANEOUS | Status: AC
  Administered 2023-01-11: 120 mg via SUBCUTANEOUS
  Filled 2023-01-11: qty 1.7

## 2023-01-11 MED ORDER — HEPARIN SOD (PORK) LOCK FLUSH 100 UNIT/ML IV SOLN
500.0000 [IU] | Freq: Once | INTRAVENOUS | Status: AC | PRN
  Administered 2023-01-11: 500 [IU]

## 2023-01-11 MED ORDER — SODIUM CHLORIDE 0.9 % IV SOLN
10.0000 mg | Freq: Once | INTRAVENOUS | Status: AC
  Administered 2023-01-11: 10 mg via INTRAVENOUS
  Filled 2023-01-11: qty 10

## 2023-01-11 NOTE — Patient Instructions (Signed)
Managing Chemotherapy Side Effects, Adult Chemotherapy is a treatment that uses medicine to kill cancer cells. However, in addition to killing cancer cells, the medicines can also damage healthy cells. The damage to healthy cells can lead to side effects. The exact side effects depend on the specific medicines used. Most of the side effects of chemotherapy go away once treatment is finished. Until then, work closely with your health care providers and take an active role in managing your side effects. What are common side effects of chemotherapy? Increased risk of infection, bruising, or bleeding. Nausea and vomiting. Constipation or diarrhea. Loss of appetite. Hair loss. Mouth or throat sores. Tiredness (fatigue). Tingling, pain, or numbness in the hands and feet. Dry, sensitive, itchy, or sore skin. Sleep disturbances, such as excessive sleepiness. Confusion, anxiety, or mood swings. Memory changes. How to manage the side effects of chemotherapy Medicines Take over-the-counter and prescription medicines only as told by your health care provider. Talk with your health care provider before taking vitamins, herbs, supplements, or over-the-counter medicines. Some of these can interfere with chemotherapy. Activity Get plenty of rest. Get regular exercise by doing activities such as walking, gentle yoga, or tai chi. Return to your normal activities as told by your health care provider. Ask your health care provider what activities are safe for you. Eating and drinking  Talk to a dietitian about what you should eat and drink during cancer treatment. Drink enough fluid to keep your urine pale yellow. If you have side effects that affect eating, these tips may help: Eat smaller meals and snacks often. Drink high-nutrition and high-calorie shakes or supplements. Choose bland and soft foods that are easy to eat. Do not eat foods that are hot, spicy, or hard to swallow. Do not eat raw or  undercooked meat, eggs, or seafood. Always wash fresh fruits and vegetables well before eating them. Skin care If you have sore or itchy skin: Wear soft, comfortable clothing. Apply creams and ointments to your skin as told by your health care provider. If you lose your hair, consider wearing a wig, hat, or scarf to cover your head. You may want to have someone shave your head as you start to lose hair. During outdoor activities, protect your head and skin from the sun by using sunscreen with an SPF of 30 or higher or by wearing protective clothing and a hat. Meet with a hair and skin care specialist for makeup and skin care tips. Apply sunscreen to your scalp as told by your health care provider. General tips Learn as much as you can about your condition. If you are struggling emotionally, talk with a mental health care provider or join a support group. Keep all follow-up visits. This is important. How to prevent infection and bleeding Chemotherapy may lower your blood counts and put you at risk for infection and bleeding. Here are some ways to help prevent problems. Vaccines Talk to your health care provider about vaccines. You should not get any live vaccines, such as the polio, MMR, chickenpox, and shingles vaccines until your health care provider says that it is safe to do so. Do not be around people who have had live vaccines for as long as your health care provider recommends. Make sure you get a yearly flu shot. People who will be near you should also get a yearly flu shot. Social activity Stay away from crowded places where you could be exposed to germs. Do not be around people who may be sick or   people who have symptoms of a fever until they have been fever-free for at least 24 hours. Do not share food, cups, straws, or utensils with other people. Wear a mask when outside the home if your blood counts are low. Cleanliness  Wash your hands often for at least 20 seconds. Also make  sure that other members of your household wash their hands often. Brush your teeth twice daily using a soft toothbrush. Use mouth rinse only as told by your health care provider. Take a bath or shower daily unless your health care provider gives different instructions. General tips Take your temperature regularly, especially if you have chills or feel warm. Check with your health care provider: Before you travel. Before you have a dental procedure. Before you use a swimming pool, hot tub, or swim in a lake or ocean. If you get chemotherapy through an IV or port, check the site every day for signs of infection. Check for redness, swelling, pain, fluid, and warmth. Avoid activities that put you at risk for injury or bleeding. Use an electric razor to shave instead of a blade. Questions to ask your health care provider What are the most common side effects of my treatment? How will they affect my daily life? What can I do to manage them? What are some possible long-term side effects? What are possible complications? What support services are available? What number can I call with questions or concerns? Where to find support Cancer affects the entire family. Find out what family support resources are available from your cancer treatment center. For more support, turn to: Your cancer care team. Friends and family. Your religious community. Other people with cancer. Community-based or online support groups. Where to find more information National Cancer Institute: www.cancer.gov American Cancer Society: www.cancer.org Contact a health care provider if: You bleed or bruise more often. You notice blood in your urine or stool. You have any of these symptoms: A skin rash, or dry or itchy skin. A headache or stiff neck. Cold or flu symptoms. A cough. Persistent nausea or vomiting. Persistent diarrhea. Frequent urination, burning when passing urine, or foul-smelling urine. You cannot eat  because of mouth or throat pain. You are sad, confused, anxious, or depressed. Get help right away if: You have any of these symptoms: A fever or chills. Your health care provider should know about this right away. Redness, swelling, pain, fluid, or warmth near an IV site. Bleeding that you cannot stop. A seizure. You cannot swallow. You have chest pain. You have trouble breathing. A family member or caregiver should get help right away if you have a sudden or unusual change in behavior. These symptoms may be an emergency. Get help right away. Call 911. Do not wait to see if the symptoms will go away. Do not drive yourself to the hospital. Summary Chemotherapy is a treatment that uses medicine to kill cancer cells and can cause side effects. The specific side effects depend on the specific medicines used. Learn as much as you can about your condition. Ask about side effects to watch for and how to treat them. Seek out support and resources from others. Find out what family support resources are available from your cancer treatment center. Let your health care provider know if you notice any new, unusual, or worsening symptoms, especially fever or chills. This information is not intended to replace advice given to you by your health care provider. Make sure you discuss any questions you have with your health   care provider. Document Revised: 07/20/2021 Document Reviewed: 07/20/2021 Elsevier Patient Education  2024 [employer]  

## 2023-01-11 NOTE — Progress Notes (Unsigned)
The patient has been having intractable nausea and vomiting, presumably from her Enhertu.  She states she discontinued Ozempic last week.  She states she is taking olanzapine at bedtime and using her other as needed medications.  Her last dose of Zofran was at 0330.  Due to her metastatic breast cancer, we recommend MRI brain with and without contrast to evaluate for brain metastasis causing her nausea and vomiting.  I discussed this with the patient and she understands and agrees.  I will give her IV Zofran and Decadron today when she is at infusion for her injections.

## 2023-01-14 ENCOUNTER — Telehealth: Payer: Self-pay

## 2023-01-14 ENCOUNTER — Inpatient Hospital Stay: Payer: BC Managed Care – PPO | Attending: Hematology and Oncology

## 2023-01-14 DIAGNOSIS — C7951 Secondary malignant neoplasm of bone: Secondary | ICD-10-CM | POA: Diagnosis not present

## 2023-01-14 DIAGNOSIS — Z79811 Long term (current) use of aromatase inhibitors: Secondary | ICD-10-CM | POA: Insufficient documentation

## 2023-01-14 DIAGNOSIS — Z170 Estrogen receptor positive status [ER+]: Secondary | ICD-10-CM | POA: Insufficient documentation

## 2023-01-14 DIAGNOSIS — C50412 Malignant neoplasm of upper-outer quadrant of left female breast: Secondary | ICD-10-CM | POA: Insufficient documentation

## 2023-01-14 DIAGNOSIS — Z5111 Encounter for antineoplastic chemotherapy: Secondary | ICD-10-CM | POA: Insufficient documentation

## 2023-01-14 DIAGNOSIS — Z5112 Encounter for antineoplastic immunotherapy: Secondary | ICD-10-CM | POA: Insufficient documentation

## 2023-01-14 DIAGNOSIS — Z79899 Other long term (current) drug therapy: Secondary | ICD-10-CM | POA: Insufficient documentation

## 2023-01-14 LAB — IRON AND TIBC
Iron: 42 ug/dL (ref 28–170)
Saturation Ratios: 16 % (ref 10.4–31.8)
TIBC: 256 ug/dL (ref 250–450)
UIBC: 214 ug/dL

## 2023-01-14 LAB — CBC WITH DIFFERENTIAL (CANCER CENTER ONLY)
Abs Immature Granulocytes: 0.03 10*3/uL (ref 0.00–0.07)
Basophils Absolute: 0 10*3/uL (ref 0.0–0.1)
Basophils Relative: 0 %
Eosinophils Absolute: 0.1 10*3/uL (ref 0.0–0.5)
Eosinophils Relative: 1 %
HCT: 37.4 % (ref 36.0–46.0)
Hemoglobin: 12.1 g/dL (ref 12.0–15.0)
Immature Granulocytes: 0 %
Lymphocytes Relative: 6 %
Lymphs Abs: 0.5 10*3/uL — ABNORMAL LOW (ref 0.7–4.0)
MCH: 30.3 pg (ref 26.0–34.0)
MCHC: 32.4 g/dL (ref 30.0–36.0)
MCV: 93.5 fL (ref 80.0–100.0)
Monocytes Absolute: 0.7 10*3/uL (ref 0.1–1.0)
Monocytes Relative: 8 %
Neutro Abs: 6.8 10*3/uL (ref 1.7–7.7)
Neutrophils Relative %: 85 %
Platelet Count: 257 10*3/uL (ref 150–400)
RBC: 4 MIL/uL (ref 3.87–5.11)
RDW: 17.4 % — ABNORMAL HIGH (ref 11.5–15.5)
WBC Count: 8.1 10*3/uL (ref 4.0–10.5)
nRBC: 0 % (ref 0.0–0.2)

## 2023-01-14 LAB — COMPREHENSIVE METABOLIC PANEL
ALT: 19 U/L (ref 0–44)
AST: 18 U/L (ref 15–41)
Albumin: 3.1 g/dL — ABNORMAL LOW (ref 3.5–5.0)
Alkaline Phosphatase: 55 U/L (ref 38–126)
Anion gap: 7 (ref 5–15)
BUN: 16 mg/dL (ref 6–20)
CO2: 28 mmol/L (ref 22–32)
Calcium: 8 mg/dL — ABNORMAL LOW (ref 8.9–10.3)
Chloride: 99 mmol/L (ref 98–111)
Creatinine, Ser: 0.61 mg/dL (ref 0.44–1.00)
GFR, Estimated: >60 mL/min (ref >60)
Glucose, Bld: 116 mg/dL — ABNORMAL HIGH (ref 70–99)
Potassium: 3 mmol/L — ABNORMAL LOW (ref 3.5–5.1)
Sodium: 134 mmol/L — ABNORMAL LOW (ref 135–145)
Total Bilirubin: 0.3 mg/dL (ref 0.3–1.2)
Total Protein: 5.4 g/dL — ABNORMAL LOW (ref 6.5–8.1)

## 2023-01-14 LAB — FERRITIN: Ferritin: 333 ng/mL — ABNORMAL HIGH (ref 11–307)

## 2023-01-14 LAB — MAGNESIUM: Magnesium: 2 mg/dL (ref 1.7–2.4)

## 2023-01-14 LAB — TSH: TSH: 0.954 u[IU]/mL (ref 0.350–4.500)

## 2023-01-14 NOTE — Telephone Encounter (Signed)
Pt's spouse has called in. He states, "she has had a rough weekend. She has been vomiting, diarrhea, can't eat, & serious fatigue" Temp 99. What do you suggest Darl Pikes?  Should we send her to ER? I dont think MRI brain has been scheduled yet.   Darl Pikes Phy,RPH: Yes, I think she needs to go to the ED. This doesn't sound like its related to her chemo and needs further workup. Maybe the ED can do a stat brain MRI.

## 2023-01-15 ENCOUNTER — Ambulatory Visit: Payer: BC Managed Care – PPO

## 2023-01-15 ENCOUNTER — Inpatient Hospital Stay: Payer: BC Managed Care – PPO

## 2023-01-15 ENCOUNTER — Telehealth: Payer: Self-pay

## 2023-01-15 ENCOUNTER — Encounter: Payer: Self-pay | Admitting: Oncology

## 2023-01-15 VITALS — BP 110/74 | HR 108 | Resp 20 | Ht 59.0 in | Wt 139.1 lb

## 2023-01-15 DIAGNOSIS — C7951 Secondary malignant neoplasm of bone: Secondary | ICD-10-CM

## 2023-01-15 DIAGNOSIS — Z5111 Encounter for antineoplastic chemotherapy: Secondary | ICD-10-CM | POA: Diagnosis not present

## 2023-01-15 MED ORDER — SODIUM CHLORIDE 0.9% FLUSH
10.0000 mL | Freq: Once | INTRAVENOUS | Status: AC | PRN
  Administered 2023-01-15: 10 mL

## 2023-01-15 MED ORDER — SODIUM CHLORIDE 0.9 % IV SOLN: Freq: Once | INTRAVENOUS | Status: AC

## 2023-01-15 MED ORDER — ONDANSETRON HCL 4 MG/2ML IJ SOLN
8.0000 mg | Freq: Once | INTRAMUSCULAR | Status: AC
  Administered 2023-01-15: 8 mg via INTRAVENOUS
  Filled 2023-01-15: qty 4

## 2023-01-15 MED ORDER — HEPARIN SOD (PORK) LOCK FLUSH 100 UNIT/ML IV SOLN
500.0000 [IU] | Freq: Once | INTRAVENOUS | Status: AC | PRN
  Administered 2023-01-15: 500 [IU]

## 2023-01-15 MED ORDER — POTASSIUM CHLORIDE 10 MEQ/100ML IV SOLN
10.0000 meq | INTRAVENOUS | Status: AC
  Administered 2023-01-15 (×4): 10 meq via INTRAVENOUS
  Filled 2023-01-15: qty 100

## 2023-01-15 NOTE — Telephone Encounter (Signed)
Patient notified. She was told by her PCP to start back taking the Vitamin D. She has stopped it due to it being too high at one point.

## 2023-01-15 NOTE — Telephone Encounter (Signed)
Patient notified

## 2023-01-15 NOTE — Patient Instructions (Signed)
Potassium Acetate Injection What is this medication? POTASSIUM ACETATE (poe TASS i um ASa tate) prevents and treats low levels of potassium in your body. Potassium plays an important role in maintaining the health of your kidneys, heart, muscles, and nervous system. This medicine may be used for other purposes; ask your health care provider or pharmacist if you have questions. What should I tell my care team before I take this medication? They need to know if you have any of these conditions: Addison's disease Dehydration Diabetes Heart disease High levels of potassium in the blood Irregular heartbeat Kidney disease Liver disease Recent severe burn An unusual or allergic reaction to potassium, other medications, foods, dyes, or preservatives Pregnant or trying to get pregnant Breast-feeding How should I use this medication? This medication is for infusion into a vein. It is given in a hospital or clinic setting. Talk to your care team about the use of this medication in children. Special care may be needed. Overdosage: If you think you have taken too much of this medicine contact a poison control center or emergency room at once. NOTE: This medicine is only for you. Do not share this medicine with others. What if I miss a dose? This does not apply. What may interact with this medication? Do not take this medication with any of the following: Certain diuretics such as spironolactone, triamterene Eplerenone Sodium polystyrene sulfonate This medication may also interact with the following: Certain medications for blood pressure or heart disease like lisinopril, losartan, quinapril, valsartan Medications that lower your chance of fighting infection such as cyclosporine, tacrolimus NSAIDs, medications for pain and inflammation, like ibuprofen or naproxen Other potassium supplements Salt substitutes This list may not describe all possible interactions. Give your health care provider a  list of all the medicines, herbs, non-prescription drugs, or dietary supplements you use. Also tell them if you smoke, drink alcohol, or use illegal drugs. Some items may interact with your medicine. What should I watch for while using this medication? Your condition will be monitored carefully while you are receiving this medication. You may need blood work done while you are taking this medication. What side effects may I notice from receiving this medication? Side effects that you should report to your care team as soon as possible: Allergic reactions--skin rash, itching, hives, swelling of the face, lips, tongue, or throat High potassium level--muscle weakness, fast or irregular heartbeat Side effects that usually do not require medical attention (report these to your care team if they continue or are bothersome): Pain, redness, or irritation at injection site This list may not describe all possible side effects. Call your doctor for medical advice about side effects. You may report side effects to FDA at 1-800-FDA-1088. Where should I keep my medication? This medication is given in a hospital or clinic and will not be stored at home. NOTE: This sheet is a summary. It may not cover all possible information. If you have questions about this medicine, talk to your doctor, pharmacist, or health care provider.  2024 Elsevier/Gold Standard (2021-11-27 00:00:00)

## 2023-01-15 NOTE — Telephone Encounter (Signed)
-----   Message from Dellia Beckwith, MD sent at 01/11/2023  2:39 PM EDT ----- Regarding: call I see labs from PCP look good, but calcium just barely normal.  She is low on vitamin D and that may be part of the problem.  Has he rec vitamin D supplement?

## 2023-01-16 ENCOUNTER — Encounter: Payer: Self-pay | Admitting: Oncology

## 2023-01-16 LAB — T4: T4, Total: 9.2 ug/dL (ref 4.5–12.0)

## 2023-01-18 NOTE — Progress Notes (Signed)
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  Last seen in the office by Anna [name] on 05/08/2021 and was stable from a cardiac standpoint.  Past Medical History    Past Medical History:  Diagnosis Date   Achilles tendinitis of left lower extremity 04/22/2018   Acute peptic ulcer without hemorrhage and without perforation 11/29/2020   Acute sinusitis 11/29/2020   Bipolar disorder (HCC) 11/29/2020   Breast cancer (HCC)    Cancer (HCC)    Cardiac murmur    Chest pain    Chronic fatigue syndrome 11/29/2020   Depression    Essential hypertension 11/29/2020   Hepatic steatosis determined by biopsy of liver 07/04/2020   HLD (hyperlipidemia)    Hypertension    Hypothyroidism 11/29/2020   Insomnia 11/29/2020   Iron deficiency anemia 06/05/2022   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   Migraine 11/29/2020   Mild intermittent asthma 11/29/2020   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   Mixed hyperlipidemia 11/29/2020   Obesity due to excess calories    Osteopenia after menopause 07/04/2020   Other long term (current) drug therapy 11/29/2020   Other vitamin B12 deficiency anemias 11/29/2020   Personal history of malignant neoplasm of breast 11/29/2020   Pre-diabetes    Renal cyst, acquired, right 07/04/2020   14.5 cm in October 2021   Retrocalcaneal bursitis (back of heel), left 04/22/2018   Seasonal allergic rhinitis 11/29/2020   Tightness of heel cord, left 04/22/2018   Type 2 diabetes mellitus without complications (HCC) 11/29/2020   Vitamin D deficiency 11/29/2020   Past Surgical History:  Procedure Laterality Date   CESAREAN SECTION      IR BONE TUMOR(S)RF ABLATION  05/29/2022   IR BONE TUMOR(S)RF ABLATION  05/29/2022   IR KYPHO EA ADDL LEVEL THORACIC OR LUMBAR  05/29/2022   IR KYPHO THORACIC WITH BONE BIOPSY  05/29/2022   IR RADIOLOGIST EVAL & MGMT  05/22/2022   IR RADIOLOGIST EVAL & MGMT  06/12/2022   LIVER BIOPSY  01/2019   lumpectomy  2014   Left Breast   PORT-A-CATH REMOVAL     PORTACATH PLACEMENT  08/28/2013   STOMACH SURGERY     Tummy Tuck   TOTAL HIP ARTHROPLASTY Right 07/04/2021   Procedure: TOTAL HIP ARTHROPLASTY;  Surgeon: Durene Romans, MD;  Location: [facility];  Service: Orthopedics;  Laterality: Right;  90   WISDOM TOOTH EXTRACTION      Allergies  Allergies  Allergen Reactions   Reglan [Metoclopramide]     Made patient feel really funny when taking this medication     History of Present Illness    Anna [name] returns today for ongoing assessment and management of hypertension and hyperlipidemia, currently being treated for breast cancer on the left, with new bone metastasis, noted in the left hip, throughout the pelvis and proximal femur.  Undergoing chemotherapy and radiation therapy followed by Dr. Gilman [name] with [facility] oncology.   She comes today little frail, has chronic pain, and has had some dizziness and hypotension.  She continues chemotherapy regimen per oncology.  She has been losing weight and has not had much of an appetite.  She has noticed during follow-up appointments  with oncology that her blood pressure has been much lower than normal in the low 100s and high 90s systolic.   Home Medications    Current Outpatient Medications  Medication Sig Dispense Refill   albuterol (VENTOLIN HFA) 108 (90 Base) MCG/ACT inhaler Inhale 2 puffs into the lungs every 6 (six) hours as needed for shortness of breath.     ARIPiprazole (ABILIFY) 5 MG tablet Take 2.5 mg by mouth daily.     aspirin 81 MG EC tablet Take 81 mg by mouth daily. Swallow whole.     atorvastatin (LIPITOR) 20 MG tablet TAKE ONE  TABLET BY MOUTH EVERY DAY 90 tablet 0   augmented betamethasone dipropionate (DIPROLENE-AF) 0.05 % cream      calcium carbonate (TUMS - DOSED IN MG ELEMENTAL CALCIUM) 500 MG chewable tablet Chew 1 tablet by mouth daily. 4 gummies daily/ not tablet form .     CLENPIQ 10-3.5-12 MG-GM -GM/160ML SOLN      dapagliflozin propanediol (FARXIGA) 10 MG TABS tablet TAKE ONE TABLET BY MOUTH EVERY DAY 30 tablet 0   desvenlafaxine (PRISTIQ) 50 MG 24 hr tablet Take 50 mg by mouth daily.     dexamethasone (DECADRON) 4 MG tablet Take 1 tablet (4 mg total) by mouth 2 (two) times daily. For 5 days after chemotherapy 20 tablet 2   diphenoxylate-atropine (LOMOTIL) 2.5-0.025 MG tablet TAKE TWO TABLETS BY MOUTH FOUR TIMES A DAY AS NEEDED FOR DIARRHEA OR LOOSE STOOLS 100 tablet 1   furosemide (LASIX) 20 MG tablet Take 1 tablet (20 mg total) by mouth daily. (Patient taking differently: Take 20 mg by mouth as needed.) 30 tablet 0   labetalol (NORMODYNE) 200 MG tablet Take 1 tablet (200 mg total) by mouth 2 (two) times daily. 180 tablet 3   loratadine (CLARITIN) 10 MG tablet TAKE ONE TABLET BY MOUTH DAILY 30 tablet 2   LORazepam (ATIVAN) 1 MG tablet TAKE ONE TABLET BY MOUTH EVERY 8 HOURS 30 tablet 1   naloxone (NARCAN) nasal spray 4 mg/0.1 mL One spray in nostril as needed, may repeat every 2 to 3 minutes until medical assistance becomes available 4 each 1   nitroGLYCERIN (NITROSTAT) 0.4 MG SL tablet Place 0.4 mg under the tongue every 5 (five) minutes as needed for chest pain.     nystatin (MYCOSTATIN/NYSTOP) powder Apply 1 Application topically 2 (two) times daily.     OLANZapine (ZYPREXA) 5 MG tablet Take 1 tablet (5 mg total) by mouth at bedtime. 30 tablet 5   ondansetron (ZOFRAN) 8 MG tablet Take 1 tablet (8 mg total) by mouth every 8 (eight) hours as needed for nausea or vomiting. 60 tablet 5   Oxycodone HCl 10 MG TABS TAKE ONE TABLET BY MOUTH EVERY 4 HOURS AS NEEDED FOR PAIN 180 tablet 0   OZEMPIC, 0.25 OR 0.5  MG/DOSE, 2 MG/3ML SOPN Inject into the skin.     potassium chloride SA (KLOR-CON M) 20 MEQ tablet Take 1 tablet (20 mEq total) by mouth 2 (two) times daily. 60 tablet 0   prochlorperazine (COMPAZINE) 10 MG tablet TAKE ONE TABLET BY MOUTH EVERY 6 HOURS AS NEEDED FOR NAUSEA/VOMITING 90 tablet 3   gabapentin (NEURONTIN) 300 MG capsule Take 1 capsule (300 mg total) by mouth at bedtime. (Patient not taking: Reported on 01/31/2023) 30 capsule 1   magic mouthwash (lidocaine, diphenhydrAMINE, alum & mag hydroxide) suspension Take 5 mLs by mouth every 3 (three) hours as needed. Swish and spit; for throat/mouth pain (Patient not taking:  Reported on 01/31/2023)     Current Facility-Administered Medications  Medication Dose Route Frequency Provider Last Rate Last Admin   0.9 %  sodium chloride infusion  500 mL Intravenous Once Lynann Bologna, MD       technetium sestamibi generic (CARDIOLITE) injection 10.5 millicurie  10.5 millicurie Intravenous Once PRN Revankar, Aundra Dubin, MD         Family History    Family History  Problem Relation Age of Onset   Hyperlipidemia Mother    Diabetes Father    Stroke Father    Colon cancer Neg Hx    Colon polyps Neg Hx    Esophageal cancer Neg Hx    Rectal cancer Neg Hx    Stomach cancer Neg Hx    She indicated that her mother is alive. She indicated that her father is alive. She indicated that the status of her neg hx is unknown.  Social History    Social History   Socioeconomic History   Marital status: Married    Spouse name: Not on file   Number of children: Not on file   Years of education: Not on file   Highest education level: Not on file  Occupational History   Not on file  Tobacco Use   Smoking status: Former    Types: Cigarettes    Quit date: 2004    Years since quitting: 20.4   Smokeless tobacco: Never  Vaping Use   Vaping Use: Never used  Substance and Sexual Activity   Alcohol use: Yes    Comment: occasionally   Drug use: Never    Sexual activity: Not on file  Other Topics Concern   Not on file  Social History Narrative   Not on file   Social Determinants of Health   Financial Resource Strain: Not on file  Food Insecurity: Not on file  Transportation Needs: Not on file  Physical Activity: Not on file  Stress: Not on file  Social Connections: Not on file  Intimate Partner Violence: Not on file     Review of Systems    General:  No chills, fever, night sweats or positive for decreased weight, generalized fatigue. Cardiovascular:  No chest pain, dyspnea on exertion, edema, orthopnea, palpitations, paroxysmal nocturnal dyspnea. Dermatological: No rash, lesions/masses.   Respiratory: No cough, dyspnea Urologic: No hematuria, dysuria Abdominal:   No nausea, vomiting, diarrhea, bright red blood per rectum, melena, or hematemesis Neurologic:  No visual changes, wkns, changes in mental status.  Positive for dizziness. All other systems reviewed and are otherwise negative except as noted above.     Physical Exam    VS:  BP 112/78   Pulse (!) 108   Ht 4\' 8"  (1.422 m)   Wt 135 lb 3.2 oz (61.3 kg)   LMP 08/18/2013   SpO2 95%   BMI 30.31 kg/m  , BMI Body mass index is 30.31 kg/m.     GEN: Thin, well developed, in no acute distress. HEENT: normal.  Alopecia is noted. Neck: Supple, no JVD, carotid bruits, or masses. Cardiac: RRR, no murmurs, rubs, or gallops. No clubbing, cyanosis, edema.  Radials/DP/PT 2+ and equal bilaterally.  Respiratory:  Respirations regular and unlabored, clear to auscultation bilaterally. GI: Soft, nontender, nondistended, BS + x 4. MS: no deformity or atrophy. Skin: warm and dry, no rash. Neuro:  Strength and sensation are intact. Psych: Normal affect.  Accessory Clinical Findings    ECG personally reviewed by me today-sinus tachycardia heart rate 108 bpm.  Lab Results  Component Value Date   WBC 4.1 01/22/2023   HGB 10.8 (L) 01/22/2023   HCT 35.6 (L) 01/22/2023   MCV  96.7 01/22/2023   PLT 296 01/22/2023   Lab Results  Component Value Date   CREATININE 0.57 01/22/2023   BUN 14 01/22/2023   NA 141 01/22/2023   K 3.5 01/22/2023   CL 102 01/22/2023   CO2 32 01/22/2023   Lab Results  Component Value Date   ALT 23 01/22/2023   AST 22 01/22/2023   ALKPHOS 51 01/22/2023   BILITOT 0.1 (L) 01/22/2023   No results found for: "CHOL", "HDL", "LDLCALC", "LDLDIRECT", "TRIG", "CHOLHDL"  Lab Results  Component Value Date   HGBA1C 5.9 (H) 06/05/2022    Review of Prior Studies: Echocardiogram 10/18/2022  1. TDS, decrease LVEF as compared to echo from 07/16/2022. Left  ventricular ejection fraction, by estimation, is 50 to 55%. The left  ventricle has low normal function. The left ventricle has no regional wall  motion abnormalities. Left ventricular  diastolic parameters are consistent with Grade I diastolic dysfunction  (impaired relaxation).   2. Right ventricular systolic function is normal. The right ventricular  size is normal. There is normal pulmonary artery systolic pressure.   3. The mitral valve is normal in structure. No evidence of mitral valve  regurgitation. No evidence of mitral stenosis.   4. The aortic valve is normal in structure. Aortic valve regurgitation is  not visualized. No aortic stenosis is present.   5. The inferior vena cava is normal in size with greater than 50%  respiratory variability, suggesting right atrial pressure of 3 mmHg.   Assessment & Plan   1.  Hypotension: Likely related to chemotherapy, anorexia associated with this, and chronic pain.  She has associated dizziness and fatigue with this.  Going to discontinue valsartan.  If she has significant increase in blood pressure may need to reintroduce antihypertensive for now due to her symptoms and hypotension will hold off on ARB.  Repeating echocardiogram.  2.  Tachycardia: Noted on EKG and on assessment.  She is having a good bit of pain.  Continue labetalol 200 mg.   She is only taking it daily, and I advised to take it twice daily.  May need to consider changing this medication of cardioselective beta-blocker.  3.  Type 2 diabetes: On Farxiga, only using Lasix as needed.  Continue regimen per recommendations by PCP.  4.  Breast cancer with METS to the bone.  Followed by oncology with pain management, and ongoing chemotherapy.         Signed, Bettey Mare. Liborio Nixon, ANP, AACC   01/31/2023 5:11 PM      Office [phone] Fax [phone]  Notice: This dictation was prepared with Dragon dictation along with smaller phrase technology. Any transcriptional errors that result from this process are unintentional and may not be corrected upon review.

## 2023-01-19 ENCOUNTER — Other Ambulatory Visit: Payer: Self-pay | Admitting: Cardiology

## 2023-01-21 ENCOUNTER — Telehealth: Payer: Self-pay

## 2023-01-21 NOTE — Telephone Encounter (Signed)
Patient notified and voiced understanding.

## 2023-01-21 NOTE — Telephone Encounter (Signed)
Kelli Mosher,PA, hadn't received any results. Angela,LPN, printed them off for her.

## 2023-01-21 NOTE — Telephone Encounter (Signed)
-----   Message from Adah Perl, PA-C sent at 01/21/2023 11:15 AM EDT ----- Please let her know MRI did not show brain mets or other brain problem. They do see bone metastasis in neck and skull seen on bone scan last year. Thanks

## 2023-01-22 ENCOUNTER — Inpatient Hospital Stay: Payer: BC Managed Care – PPO

## 2023-01-22 DIAGNOSIS — C50412 Malignant neoplasm of upper-outer quadrant of left female breast: Secondary | ICD-10-CM

## 2023-01-22 DIAGNOSIS — Z5111 Encounter for antineoplastic chemotherapy: Secondary | ICD-10-CM | POA: Diagnosis not present

## 2023-01-22 DIAGNOSIS — C7951 Secondary malignant neoplasm of bone: Secondary | ICD-10-CM

## 2023-01-22 LAB — CBC WITH DIFFERENTIAL (CANCER CENTER ONLY)
Abs Immature Granulocytes: 0.02 10*3/uL (ref 0.00–0.07)
Basophils Absolute: 0 10*3/uL (ref 0.0–0.1)
Basophils Relative: 1 %
Eosinophils Absolute: 0.2 10*3/uL (ref 0.0–0.5)
Eosinophils Relative: 5 %
HCT: 35.6 % — ABNORMAL LOW (ref 36.0–46.0)
Hemoglobin: 10.8 g/dL — ABNORMAL LOW (ref 12.0–15.0)
Immature Granulocytes: 1 %
Lymphocytes Relative: 12 %
Lymphs Abs: 0.5 10*3/uL — ABNORMAL LOW (ref 0.7–4.0)
MCH: 29.3 pg (ref 26.0–34.0)
MCHC: 30.3 g/dL (ref 30.0–36.0)
MCV: 96.7 fL (ref 80.0–100.0)
Monocytes Absolute: 0.5 10*3/uL (ref 0.1–1.0)
Monocytes Relative: 13 %
Neutro Abs: 2.8 10*3/uL (ref 1.7–7.7)
Neutrophils Relative %: 68 %
Platelet Count: 296 10*3/uL (ref 150–400)
RBC: 3.68 MIL/uL — ABNORMAL LOW (ref 3.87–5.11)
RDW: 18 % — ABNORMAL HIGH (ref 11.5–15.5)
WBC Count: 4.1 10*3/uL (ref 4.0–10.5)
nRBC: 0 % (ref 0.0–0.2)

## 2023-01-22 LAB — CMP (CANCER CENTER ONLY)
ALT: 23 U/L (ref 0–44)
AST: 22 U/L (ref 15–41)
Albumin: 3 g/dL — ABNORMAL LOW (ref 3.5–5.0)
Alkaline Phosphatase: 51 U/L (ref 38–126)
Anion gap: 7 (ref 5–15)
BUN: 14 mg/dL (ref 6–20)
CO2: 32 mmol/L (ref 22–32)
Calcium: 9 mg/dL (ref 8.9–10.3)
Chloride: 102 mmol/L (ref 98–111)
Creatinine: 0.57 mg/dL (ref 0.44–1.00)
GFR, Estimated: >60 mL/min (ref >60)
Glucose, Bld: 96 mg/dL (ref 70–99)
Potassium: 3.5 mmol/L (ref 3.5–5.1)
Sodium: 141 mmol/L (ref 135–145)
Total Bilirubin: 0.1 mg/dL — ABNORMAL LOW (ref 0.3–1.2)
Total Protein: 5.7 g/dL — ABNORMAL LOW (ref 6.5–8.1)

## 2023-01-22 LAB — MAGNESIUM: Magnesium: 1.7 mg/dL (ref 1.7–2.4)

## 2023-01-22 NOTE — Progress Notes (Signed)
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 She was on adjuvant hormonal therapy with tamoxifen 20 mg daily from October 2015 to May 2020, but due to elevation of the liver transaminases, was switched to anastrazole.   Malignant neoplasm metastatic to bone [facility]) New bone metastases, November 2022, with multiple marrow replacing lesions within the proximal right femur within the ischium as well as the inferior rami. 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 Ki67 was 60%. PET imaging from December 19th revealed dominant finding of intensely hypermetabolic aggressive skeletal metastasis involving the calvarium, sternum, thoracic and lumbar spine, and pelvis, with potential pathologic fracture of the right femur with internal fixation. There is soft tissue extension into the anterior mediastinum associated with the manubrial metastasis. Multiple spinal vertebral body lesions have cortical destruction along the posterior margin. She started monthly Xgeva in December, and completed radiation therapy. She was receiving THP but cycle 4 was delayed and dose reduction made by 20% due to left leg skin infection. She continued with Herceptin/Perjeta until she had progression of disease. We then switched her to [facility] chemotherapy, but she has had significant  toxicities of nausea, vomiting, diarrhea, and hypokalemia. She was also placed on hormonal therapy with Faslodex injections.    Increased Left Hip Pain In January 2024, she developed increased pain of the left hip. She was found to have innumerable metastatic lesions throughout the pelvis and proximal femur. There is a non displaced pathologic fracture through a large lesion in the superior left acetabulum. She was referred to an orthopedic oncologist at [facility] and they recommended radiation and no surgery. She has now received her palliative radiation. She followed-up with [facility] in April and they do not recommend surgery but will continue surveillance.    Hypokalemia This was severe due to intolerance to oral supplement and persistent nausea, vomiting, and diarrhea. We had to give her IV potassium last week and we will recheck it on June, 21st.    Hypocalcemia Her calcium is better but this is with taking 6 tablets daily.  Her vitamin D level was adequate.  I recommended she increase her calcium supplement to 3 tablets TID.  I stressed the importance of her calcium supplement.   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  She was found to be iron deficient in September 2023 and was given IV iron supplement. Her hemoglobin has decreased from 12.1 to 10.8.    Abnormal Mammogram She does have thickening of the skin and trabecular thickening of the left breast. I find no significant change on physical exam and feel that a lot of this can be post radiation change. In view of her widespread bone metastases, I will not pursue further evaluation but will continue periodic breast exams.  On CT scan  this nodule of the left breast appears to be 12 mm, down from 13 mm in size   Large Cyst of the Right Kidney This arises from the lower pole and has been  present for a long time, previously measuring 11 mm on last scan from September, 2023. During her CT simulation for her left hip radiation, the physicist noted that the cyst is enlarged and so we did re-evaluate.  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   Elevated left hemidiaphragm This is a new finding since the scan of 2020 and may be related to mediastinal involvement.  Is associated with atelectasis at the left base and explains why I heard decreased breath sounds in this area.  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  For now we will continue the Enhertu and Faslodex but keep this in mind for the future.  Nausea/Vomiting She was having this daily despite adding Emend and taking regular doses of Zofran, Compazine, and Ativan. I therefore added olanzapine and start at 5mg  at bedtime and she seems to be doing better. I will also added Dexamethazone 4mg  to take twice daily for 3 to 5 days after chemo.   Plan:  She had a MRI of the head on 01/18/2023 that revealed unremarkable MRI appearance of the brain, no evidence of intracranial metastatic disease, and foci of signal abnormality (some with possible corresponding enhancement) within visualized upper cervical spine at C2 and C4 and within the calvarium. Her day 1 cycle 6 of ENHERTU is scheduled on 01/25/2023. She is scheduled for an echocardiogram on 02/12/2023. She has an appointment with her cardiologist on June, 20th.  Her WBC is 4.1, hemoglobin dropped from 12.1 to 10.8, and platelet count is 296,000 as of 01/21/2023. Her CMP is normal. She informed me that she had some black tarry stool last week but it is now resolved. I advised her to monitor her stools and if it turns tarry again, then to contact us. She did have IV potassium last week. She continues oral potassium and her  potassium had improved from 3.0 to 3.5.  Her last CT scan was on 10/04/2022 so we will likely repeat it later in July. I will see her back in 1.5 weeks with CBC, CMP, and possible IV depending on her labs. I will also see her back in 3 weeks with CBC, CMP, and echocardiogram for her next chemotherapy on July, 5th.  The patient understands the plans discussed today and is in agreement with them.  She knows to contact our office if she develops concerns prior to her next appointment.   I provided 25 minutes of face-to-face time during this encounter and > 50% was spent counseling as documented under my assessment and plan.    Dellia Beckwith, MD  [facility] AT [facility] [address] [place] [address] Dept: [phone] Dept Fax: [phone]   No orders of the defined types were placed in this encounter.    CHIEF COMPLAINT:  CC: A 52 year old female with history of breast cancer  Current Treatment:  THP; will proceed with HP  INTERVAL HISTORY:  Faustine is here today for repeat clinical assessment for her breast cancer metastatic to bone.  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 I mentioned  that there are now 2 types of medication that target this type of mutation that we can use if the need arrives. She completed radiation at the left hip on 12/25/2022. Patient states that she feels ok and complains of back pain rating a 4/10, she had diarrhea a couple of days ago but it is now resolved. She had a MRI of the head on 01/18/2023 that revealed unremarkable MRI appearance of the brain, no evidence of intracranial metastatic disease, and foci of signal abnormality (some with possible corresponding enhancement) within visualized upper cervical spine at C2 and C4 and within the calvarium. Her day 1 cycle 6 of ENHERTU is scheduled on 01/25/2023. She is scheduled for an echocardiogram on 02/12/2023. She has an  appointment with her cardiologist on June, 20th.  Her WBC is 4.1, hemoglobin dropped from 12.1 to 10.8, and platelet count is 296,000 as of 01/21/2023. Her CMP is normal. She informed me that she had some black tarry stool last week but it is now resolved. I advised her to monitor her stools and if it turns tarry again, then to contact us. She did have IV potassium 40 meq last week. She continues oral potassium and her potassium had improved from 3.0 to 3.5.  Her last CT scan was on 10/04/2022 so we will likely repeat it later in July. I will see her back in 1.5 weeks with CBC, CMP, and possible IV fluids  or potassium, depending on her labs. I will also see her back in 3 weeks with CBC, CMP, and echocardiogram for her next chemotherapy on July, 5th. She denies signs of infection such as sore throat, sinus drainage, cough, or urinary symptoms.  She denies fevers or recurrent chills. She denies pain. She denies nausea, vomiting, chest pain, dyspnea or cough. Her appetite has improved and her weight has increased 3 pounds over last week . She is accompanied today by her husband as usual.    I have reviewed the past medical history, past surgical history, social history and family history with the patient and they are unchanged from previous note.  ALLERGIES:  is allergic to reglan [metoclopramide].  MEDICATIONS:  Current Outpatient Medications  Medication Sig Dispense Refill   albuterol (VENTOLIN HFA) 108 (90 Base) MCG/ACT inhaler Inhale 2 puffs into the lungs every 6 (six) hours as needed for shortness of breath.     ARIPiprazole (ABILIFY) 5 MG tablet Take 2.5 mg by mouth daily.     aspirin 81 MG EC tablet Take 81 mg by mouth daily. Swallow whole.     atorvastatin (LIPITOR) 20 MG tablet TAKE ONE TABLET BY MOUTH EVERY DAY 90 tablet 0   augmented betamethasone dipropionate (DIPROLENE-AF) 0.05 % cream      calcium carbonate (TUMS - DOSED IN MG ELEMENTAL CALCIUM) 500 MG chewable tablet Chew 1 tablet by  mouth daily. 4 gummies daily/ not tablet form .     CLENPIQ 10-3.5-12 MG-GM -GM/160ML SOLN      dapagliflozin propanediol (FARXIGA) 10 MG TABS tablet TAKE ONE TABLET BY MOUTH EVERY DAY 30 tablet 0   desvenlafaxine (PRISTIQ) 50 MG 24 hr tablet Take 50 mg by mouth daily.     dexamethasone (DECADRON) 4 MG tablet Take 1 tablet (4 mg total) by mouth 2 (two) times daily. For 5 days after chemotherapy 20 tablet 2   diphenoxylate-atropine (LOMOTIL) 2.5-0.025 MG tablet TAKE TWO TABLETS BY MOUTH FOUR TIMES A DAY AS NEEDED FOR DIARRHEA OR LOOSE STOOLS 100 tablet 1   furosemide (  LASIX) 20 MG tablet Take 1 tablet (20 mg total) by mouth daily. (Patient taking differently: Take 20 mg by mouth as needed.) 30 tablet 0   gabapentin (NEURONTIN) 300 MG capsule Take 1 capsule (300 mg total) by mouth at bedtime. (Patient not taking: Reported on 01/31/2023) 30 capsule 1   labetalol (NORMODYNE) 200 MG tablet Take 1 tablet (200 mg total) by mouth 2 (two) times daily. 180 tablet 3   loratadine (CLARITIN) 10 MG tablet TAKE ONE TABLET BY MOUTH DAILY 30 tablet 2   LORazepam (ATIVAN) 1 MG tablet TAKE ONE TABLET BY MOUTH EVERY 8 HOURS 30 tablet 1   magic mouthwash (lidocaine, diphenhydrAMINE, alum & mag hydroxide) suspension Take 5 mLs by mouth every 3 (three) hours as needed. Swish and spit; for throat/mouth pain (Patient not taking: Reported on 01/31/2023)     naloxone [facility]) nasal spray 4 mg/0.1 mL One spray in nostril as needed, may repeat every 2 to 3 minutes until medical assistance becomes available 4 each 1   nitroGLYCERIN (NITROSTAT) 0.4 MG SL tablet Place 0.4 mg under the tongue every 5 (five) minutes as needed for chest pain.     nystatin (MYCOSTATIN/NYSTOP) powder Apply 1 Application topically 2 (two) times daily.     OLANZapine (ZYPREXA) 5 MG tablet Take 1 tablet (5 mg total) by mouth at bedtime. 30 tablet 5   ondansetron (ZOFRAN) 8 MG tablet Take 1 tablet (8 mg total) by mouth every 8 (eight) hours as needed for  nausea or vomiting. 60 tablet 5   Oxycodone HCl 10 MG TABS TAKE ONE TABLET BY MOUTH EVERY 4 HOURS AS NEEDED FOR PAIN 180 tablet 0   OZEMPIC, 0.25 OR 0.5 MG/DOSE, 2 MG/3ML SOPN Inject into the skin.     potassium chloride SA (KLOR-CON M) 20 MEQ tablet Take 1 tablet (20 mEq total) by mouth 2 (two) times daily. 60 tablet 0   prochlorperazine (COMPAZINE) 10 MG tablet TAKE ONE TABLET BY MOUTH EVERY 6 HOURS AS NEEDED FOR NAUSEA/VOMITING 90 tablet 3   Current Facility-Administered Medications  Medication Dose Route Frequency Provider Last Rate Last Admin   0.9 %  sodium chloride infusion  500 mL Intravenous Once Lynann Bologna, MD       technetium sestamibi generic (CARDIOLITE) injection 10.5 millicurie  10.5 millicurie Intravenous Once PRN Revankar, Aundra Dubin, MD        HISTORY OF PRESENT ILLNESS:   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 (HCC)   07/23/2013 Cancer Staging   Staging form: Breast, AJCC 7th Edition - Clinical stage from 07/23/2013: Stage IIB (T2, N1, M0) - Signed by Dellia Beckwith, MD on 07/04/2020 Prognostic indicators: Pos LVI   09/22/2021 - 04/06/2022 Chemotherapy   Patient is on Treatment Plan : BREAST  Trastuzumab + Pertuzumab q21d      09/22/2021 - 09/21/2022 Chemotherapy   Patient is on Treatment Plan : BREAST Trastuzumab + Pertuzumab q21d     10/12/2022 -  Chemotherapy   Patient is on Treatment Plan : BREAST METASTATIC Fam-Trastuzumab Deruxtecan-nxki (Enhertu) (5.4) q21d     Malignant neoplasm metastatic to bone (HCC)  06/28/2021 Initial Diagnosis   Bone metastases (HCC)   09/22/2021 - 04/06/2022 Chemotherapy   Patient is on Treatment Plan : BREAST  Trastuzumab + Pertuzumab q21d      09/22/2021 - 09/21/2022 Chemotherapy   Patient is on Treatment Plan :  BREAST Trastuzumab + Pertuzumab q21d     05/11/2022 Imaging   CT chest,  abdomen and pelvis:  IMPRESSION:  1. Widespread bony metastatic disease shows increased sclerosis  since previous imaging. This is likely related to response to  therapy in the interval. There is a single lesion along the LEFT  iliac which shows continued lytic change and warrants attention on  follow-up  2. Interval development of pathologic fractures at in the upper  thoracic spine and at the thoracolumbar junction with associated  kyphosis.  3. No signs of solid organ or nodal metastatic disease.  4. Marked elevation of the LEFT hemidiaphragm as on the prior PET.  5. Aortic atherosclerosis.    10/12/2022 -  Chemotherapy   Patient is on Treatment Plan : BREAST METASTATIC Fam-Trastuzumab Deruxtecan-nxki (Enhertu) (5.4) q21d         REVIEW OF SYSTEMS:  Review of Systems  Constitutional:  Positive for appetite change (improved). Negative for chills, diaphoresis, fatigue, fever and unexpected weight change.  HENT:  Negative.  Negative for hearing loss, lump/mass, mouth sores, nosebleeds, sore throat, tinnitus, trouble swallowing and voice change.   Eyes: Negative.  Negative for eye problems and icterus.  Respiratory:  Positive for shortness of breath (mild). Negative for chest tightness, cough, hemoptysis and wheezing.   Cardiovascular: Negative.  Negative for chest pain, leg swelling and palpitations.  Gastrointestinal:  Positive for nausea (improved). Negative for abdominal distention, abdominal pain, blood in stool, constipation, diarrhea, rectal pain and vomiting.  Endocrine: Negative.   Genitourinary: Negative.  Negative for bladder incontinence, difficulty urinating, dyspareunia, dysuria, frequency, hematuria, menstrual problem, nocturia, pelvic pain, vaginal bleeding and vaginal discharge.   Musculoskeletal:  Positive for back pain (lower back pain, 4/10). Negative for arthralgias, flank pain, gait problem, myalgias, neck pain and neck stiffness.  Skin:  Negative for itching, rash  and wound.  Neurological: Negative.  Negative for dizziness, extremity weakness, gait problem, headaches, light-headedness, numbness, seizures and speech difficulty.  Hematological: Negative.  Negative for adenopathy. Does not bruise/bleed easily.  Psychiatric/Behavioral: Negative.  Negative for confusion, decreased concentration, depression, sleep disturbance and suicidal ideas. The patient is not nervous/anxious.    VITALS:  Blood pressure (!) 95/54, pulse 93, temperature 98.4 F (36.9 C), temperature source Oral, resp. rate 16, height 4\' 11"  (1.499 m), weight 136 lb 3.2 oz (61.8 kg), last menstrual period 08/18/2013, SpO2 100 %.  Wt Readings from Last 3 Encounters:  01/31/23 135 lb 3.2 oz (61.3 kg)  01/25/23 138 lb 1.3 oz (62.6 kg)  01/23/23 136 lb 3.2 oz (61.8 kg)    Body mass index is 27.51 kg/m.  Performance status (ECOG): 1 - Symptomatic but completely ambulatory  PHYSICAL EXAM:  Physical Exam Vitals and nursing note reviewed. Exam conducted with a chaperone present.  Constitutional:      General: She is not in acute distress.    Appearance: Normal appearance. She is normal weight. She is not ill-appearing, toxic-appearing or diaphoretic.  HENT:     Head: Normocephalic and atraumatic.     Right Ear: Tympanic membrane, ear canal and external ear normal. There is no impacted cerumen.     Left Ear: Tympanic membrane, ear canal and external ear normal. There is no impacted cerumen.     Nose: Nose normal. No congestion or rhinorrhea.     Mouth/Throat:     Mouth: Mucous membranes are moist.     Pharynx: Oropharynx is clear. No oropharyngeal exudate or posterior oropharyngeal erythema.  Eyes:  General: No scleral icterus.       Right eye: No discharge.        Left eye: No discharge.     Extraocular Movements: Extraocular movements intact.     Conjunctiva/sclera: Conjunctivae normal.     Pupils: Pupils are equal, round, and reactive to light.  Neck:     Vascular: No carotid  bruit.     Comments: Chronic flexion of the neck.  Cardiovascular:     Rate and Rhythm: Normal rate and regular rhythm.     Pulses: Normal pulses.     Heart sounds: Normal heart sounds. No murmur heard.    No friction rub. No gallop.  Pulmonary:     Effort: Pulmonary effort is normal. No respiratory distress.     Breath sounds: Normal breath sounds. No stridor or decreased air movement. No wheezing, rhonchi or rales.  Chest:     Chest wall: No tenderness.     Comments: Well healed scar in the upper outer of the left breast. Firmness diffusely in the left breast consistent with post radiation changes and the thickening of skin in both breast.  Abdominal:     General: Bowel sounds are normal. There is no distension.     Palpations: Abdomen is soft. There is no hepatomegaly, splenomegaly or mass.     Tenderness: There is no abdominal tenderness. There is no right CVA tenderness, left CVA tenderness, guarding or rebound.     Hernia: No hernia is present.  Musculoskeletal:        General: No swelling, tenderness, deformity or signs of injury. Normal range of motion.     Cervical back: Normal range of motion and neck supple. No rigidity or tenderness.     Right lower leg: No edema.     Left lower leg: No edema.  Lymphadenopathy:     Cervical: No cervical adenopathy.     Right cervical: No superficial, deep or posterior cervical adenopathy.    Left cervical: No superficial, deep or posterior cervical adenopathy.     Upper Body:     Right upper body: No supraclavicular, axillary or pectoral adenopathy.     Left upper body: No supraclavicular, axillary or pectoral adenopathy.  Skin:    General: Skin is warm and dry.     Coloration: Skin is not jaundiced or pale.     Findings: No bruising, erythema, lesion or rash.     Comments: Bilateral lower extremities are red and swollen with induration, skin is dry and peeling but overall improved.   Neurological:     General: No focal deficit  present.     Mental Status: She is alert and oriented to person, place, and time. Mental status is at baseline.     Cranial Nerves: No cranial nerve deficit.     Sensory: No sensory deficit.     Motor: No weakness.     Coordination: Coordination normal.     Gait: Gait normal.     Deep Tendon Reflexes: Reflexes normal.  Psychiatric:        Mood and Affect: Mood normal.        Behavior: Behavior normal.        Thought Content: Thought content normal.        Judgment: Judgment normal.    LABORATORY DATA:  I have reviewed the data as listed            Component Ref Range & Units 1 d ago (01/22/23) 9 d ago (01/14/23)  2 wk ago (01/09/23) 3 wk ago (01/01/23) 1 mo ago (12/11/22) 2 mo ago (11/21/22) 2 mo ago (11/21/22)  WBC Count 4.0 - 10.5 K/uL 4.1 8.1 7.1 3.2 Low  5.0  4.2 R  RBC 3.87 - 5.11 MIL/uL 3.68 Low  4.00 4.38 4.16 4.20 4.32 R   Hemoglobin 12.0 - 15.0 g/dL 16.1 Low  09.6 04.5 40.9 11.9 Low   11.8 Abnormal  R  HCT 36.0 - 46.0 % 35.6 Low  37.4 40.9 38.9 37.9  36 R  MCV 80.0 - 100.0 fL 96.7 93.5 93.4 93.5 90.2  84 R  MCH 26.0 - 34.0 pg 29.3 30.3 29.7 28.8 28.3    MCHC 30.0 - 36.0 g/dL 81.1 91.4 78.2 95.6 21.3    RDW 11.5 - 15.5 % 18.0 High  17.4 High  17.7 High  18.3 High  18.7 High     Platelet Count 150 - 400 K/uL 296 257 267 272 274  317  nRBC 0.0 - 0.2 % 0.0 0.0 0.0 0.0 0.0    Neutrophils Relative % % 68 85 90 62 64    Neutro Abs 1.7 - 7.7 K/uL 2.8 6.8 6.4 2.0 3.2  2.39 R  Lymphocytes Relative % 12 6 5 12 20     Lymphs Abs 0.7 - 4.0 K/uL 0.5 Low  0.5 Low  0.4 Low  0.4 Low  1.0    Monocytes Relative % 13 8 4 12 11  13  R  Monocytes Absolute 0.1 - 1.0 K/uL 0.5 0.7 0.3 0.4 0.5    Eosinophils Relative % 5 1 0 12 4    Eosinophils Absolute 0.0 - 0.5 K/uL 0.2 0.1 0.0 0.4 0.2    Basophils Relative % 1 0 0 1 1    Basophils Absolute 0.0 - 0.1 K/uL 0.0 0.0 0.0 0.0 0.0    Immature Granulocytes % 1 0 1 1 0    Abs Immature Granulocytes 0.00 - 0.07 K/uL 0.02 0.03 CM  0.04 CM 0.02 CM 0.02 CM     Component Ref Range & Units 1 d ago (01/22/23) 9 d ago (01/14/23) 2 wk ago (01/09/23) 3 wk ago (01/01/23) 1 mo ago (12/11/22) 2 mo ago (11/21/22) 2 mo ago (10/31/22)  Sodium 135 - 145 mmol/L 141 134 Low  139 139 139 138 141  Potassium 3.5 - 5.1 mmol/L 3.5 3.0 Low  4.4 2.8 Low  3.4 Low  3.6 3.3 Low   Chloride 98 - 111 mmol/L 102 99 102 104 103 101 102  CO2 22 - 32 mmol/L 32 28 29 27 29 29 31   Glucose, Bld 70 - 99 mg/dL 96 086 High  CM 578 High  CM 137 High  CM 91 CM 89 CM 83 CM  BUN 6 - 20 mg/dL 14 16 13 10 14 14 10   Creatinine 0.44 - 1.00 mg/dL 4.69 6.29 5.28 4.13 2.44 0.75 0.69  Calcium 8.9 - 10.3 mg/dL 9.0 8.0 Low  8.7 Low  8.1 Low  8.9 8.4 Low  8.9  Total Protein 6.5 - 8.1 g/dL 5.7 Low  5.4 Low  6.3 Low  6.0 Low  6.4 Low  6.3 Low  6.5  Albumin 3.5 - 5.0 g/dL 3.0 Low  3.1 Low  3.5 3.5 3.6 3.6 3.8  AST 15 - 41 U/L 22 18 15 21 24 22 19   ALT 0 - 44 U/L 23 19 19 19 28 24 21   Alkaline Phosphatase 38 - 126 U/L 51 55 53 52 70 63 68  Total Bilirubin 0.3 - 1.2 mg/dL 0.1 Low  0.3 0.5 0.3 0.3 0.2 Low  0.2 Low   GFR, Estimated >60 mL/min >60   >60 CM >60 CM >60 CM >60 CM  Anion gap 5 - 15 7 7  CM 8 CM 8 CM 7 CM 8 CM 8 CM             Component Ref Range & Units 1 d ago (01/22/23) 9 d ago (01/14/23) 2 wk ago (01/09/23) 2 wk ago (01/03/23) 6 mo ago (07/18/22) 6 mo ago (07/11/22) 7 mo ago (06/26/22)  Magnesium 1.7 - 2.4 mg/dL 1.7 2.0 CM 1.8 CM 1.6 Low  CM 2.0 CM 2.0 CM 2.2 CM     Component Ref Range & Units 2 wk ago (11/21/22) 2 mo ago (10/09/22) 3 mo ago (08/29/22) 5 mo ago (06/28/22) 1 yr ago (09/06/21) 1 yr ago (07/20/21)  CA 27.29 0.0 - 38.6 U/mL 46.2 High  53.8 High  CM 57.7 High  CM 50.9 High  CM 106.6 High  CM 75.6 High  C      Component Value Date/Time   NA 141 01/22/2023 1428   NA 136 (A) 10/22/2022 0000   K 3.5 01/22/2023 1428   CL 102 01/22/2023 1428   CO2 32 01/22/2023 1428   GLUCOSE 96 01/22/2023 1428   BUN 14 01/22/2023 1428   BUN  19 10/22/2022 0000   CREATININE 0.57 01/22/2023 1428   CALCIUM 9.0 01/22/2023 1428   PROT 5.7 (L) 01/22/2023 1428   ALBUMIN 3.0 (L) 01/22/2023 1428   AST 22 01/22/2023 1428   ALT 23 01/22/2023 1428   ALKPHOS 51 01/22/2023 1428   BILITOT 0.1 (L) 01/22/2023 1428   GFRNONAA >60 01/22/2023 1428   No results found for: "SPEP", "UPEP"  Lab Results  Component Value Date   WBC 4.1 01/22/2023   NEUTROABS 2.8 01/22/2023   HGB 10.8 (L) 01/22/2023   HCT 35.6 (L) 01/22/2023   MCV 96.7 01/22/2023   PLT 296 01/22/2023     Chemistry      Component Value Date/Time   NA 141 01/22/2023 1428   NA 136 (A) 10/22/2022 0000   K 3.5 01/22/2023 1428   CL 102 01/22/2023 1428   CO2 32 01/22/2023 1428   BUN 14 01/22/2023 1428   BUN 19 10/22/2022 0000   CREATININE 0.57 01/22/2023 1428   GLU 110 10/22/2022 0000      Component Value Date/Time   CALCIUM 9.0 01/22/2023 1428   ALKPHOS 51 01/22/2023 1428   AST 22 01/22/2023 1428   ALT 23 01/22/2023 1428   BILITOT 0.1 (L) 01/22/2023 1428     Component Ref Range & Units 01/14/23 1 yr ago  T4, Total 4.5 - 12.0 ug/dL 9.2 8.1     Component Ref Range & Units 01/14/23 1 yr ago  TSH 0.350 - 4.500 uIU/mL 0.954 2.560 R      Component Ref Range & Units 01/14/23 9 mo ago 1 yr ago  Ferritin 11 - 307 ng/mL 333 High  119 CM 206 CM     Component Ref Range & Units 01/14/23 9 mo ago 1 yr ago  Iron 28 - 170 ug/dL 42 25 Low  37  TIBC 161 - 450 ug/dL 096 045 409  Saturation Ratios 10.4 - 31.8 % 16 9 Low  13  UIBC ug/dL 811 914 CM 782 CM      RADIOGRAPHIC STUDIES: Exam: 01/18/2023 MRI Head without and with Contrast Impression:  1.  Unremarkable MRI appearance of the brain. No evidence of intracranial metastatic disease.  2. 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    Exam:  10/18/2022 Echocardiogram IMPRESSIONS:  1. TDS, decrease LVEF as compared to echo from 07/16/2022. Left  ventricular ejection fraction, by estimation, is 50 to 55%. The left  ventricle has low normal function. The left ventricle has no regional wall  motion abnormalities. Left ventricular  diastolic parameters are consistent with Grade I diastolic dysfunction  (impaired relaxation).   2. Right ventricular systolic function is normal. The right ventricular  size is normal. There is normal pulmonary artery systolic pressure.   3. The mitral valve is normal in structure. No evidence of mitral valve  regurgitation. No evidence of mitral stenosis.   4. The aortic valve is normal in structure. Aortic valve regurgitation is  not visualized. No aortic stenosis is present.   5. The inferior vena cava is normal in size with greater than 50%  respiratory variability, suggesting right atrial pressure of 3 mmHg.         I,Jasmine M Lassiter,acting as a scribe for Dellia Beckwith, MD.,have documented all relevant documentation on the behalf of Dellia Beckwith, MD,as directed by  Dellia Beckwith, MD while in the presence of Dellia Beckwith, MD.

## 2023-01-23 ENCOUNTER — Encounter: Payer: Self-pay | Admitting: Oncology

## 2023-01-23 ENCOUNTER — Telehealth: Payer: Self-pay

## 2023-01-23 ENCOUNTER — Inpatient Hospital Stay (INDEPENDENT_AMBULATORY_CARE_PROVIDER_SITE_OTHER): Payer: BC Managed Care – PPO | Admitting: Oncology

## 2023-01-23 VITALS — BP 95/54 | HR 93 | Temp 98.4°F | Resp 16 | Ht 59.0 in | Wt 136.2 lb

## 2023-01-23 DIAGNOSIS — C7951 Secondary malignant neoplasm of bone: Secondary | ICD-10-CM | POA: Diagnosis not present

## 2023-01-23 DIAGNOSIS — C50412 Malignant neoplasm of upper-outer quadrant of left female breast: Secondary | ICD-10-CM

## 2023-01-23 DIAGNOSIS — Z170 Estrogen receptor positive status [ER+]: Secondary | ICD-10-CM

## 2023-01-23 DIAGNOSIS — D539 Nutritional anemia, unspecified: Secondary | ICD-10-CM

## 2023-01-23 NOTE — Telephone Encounter (Signed)
-----   Message from Dellia Beckwith, MD sent at 01/23/2023  7:09 AM EDT ----- Regarding: tests Did she have ECHO? MRI brain?

## 2023-01-23 NOTE — Telephone Encounter (Signed)
Scheduled for 02/12/23 with [facility]

## 2023-01-23 NOTE — Telephone Encounter (Signed)
MRI brain done and printed. The ECHO is the one scheduled for 02/12/23 at [facility].

## 2023-01-24 MED FILL — Dexamethasone Sodium Phosphate Inj 100 MG/10ML: INTRAMUSCULAR | Qty: 1 | Status: AC

## 2023-01-24 MED FILL — Fosaprepitant Dimeglumine For IV Infusion 150 MG (Base Eq): INTRAVENOUS | Qty: 5 | Status: AC

## 2023-01-25 ENCOUNTER — Inpatient Hospital Stay: Payer: BC Managed Care – PPO

## 2023-01-25 ENCOUNTER — Other Ambulatory Visit: Payer: Self-pay | Admitting: Hematology and Oncology

## 2023-01-25 VITALS — BP 89/52 | HR 99 | Temp 98.8°F | Resp 18 | Ht 59.0 in | Wt 138.1 lb

## 2023-01-25 DIAGNOSIS — C7951 Secondary malignant neoplasm of bone: Secondary | ICD-10-CM

## 2023-01-25 DIAGNOSIS — R0789 Other chest pain: Secondary | ICD-10-CM

## 2023-01-25 DIAGNOSIS — Z5111 Encounter for antineoplastic chemotherapy: Secondary | ICD-10-CM | POA: Diagnosis not present

## 2023-01-25 DIAGNOSIS — Z170 Estrogen receptor positive status [ER+]: Secondary | ICD-10-CM

## 2023-01-25 DIAGNOSIS — M545 Low back pain, unspecified: Secondary | ICD-10-CM

## 2023-01-25 MED ORDER — DEXTROSE 5 % IV SOLN: Freq: Once | INTRAVENOUS | Status: AC

## 2023-01-25 MED ORDER — ACETAMINOPHEN 325 MG PO TABS: 650.0000 mg | ORAL_TABLET | Freq: Once | ORAL | Status: DC

## 2023-01-25 MED ORDER — DIPHENHYDRAMINE HCL 25 MG PO CAPS: 50.0000 mg | ORAL_CAPSULE | Freq: Once | ORAL | Status: DC

## 2023-01-25 MED ORDER — FAM-TRASTUZUMAB DERUXTECAN-NXKI CHEMO 100 MG IV SOLR
5.4000 mg/kg | Freq: Once | INTRAVENOUS | Status: AC
  Administered 2023-01-25: 340 mg via INTRAVENOUS
  Filled 2023-01-25: qty 17

## 2023-01-25 MED ORDER — HEPARIN SOD (PORK) LOCK FLUSH 100 UNIT/ML IV SOLN
500.0000 [IU] | Freq: Once | INTRAVENOUS | Status: AC | PRN
  Administered 2023-01-25: 500 [IU]

## 2023-01-25 MED ORDER — PALONOSETRON HCL INJECTION 0.25 MG/5ML
0.2500 mg | Freq: Once | INTRAVENOUS | Status: AC
  Administered 2023-01-25: 0.25 mg via INTRAVENOUS
  Filled 2023-01-25: qty 5

## 2023-01-25 MED ORDER — SODIUM CHLORIDE 0.9 % IV SOLN
10.0000 mg | Freq: Once | INTRAVENOUS | Status: AC
  Administered 2023-01-25: 10 mg via INTRAVENOUS
  Filled 2023-01-25: qty 10

## 2023-01-25 MED ORDER — SODIUM CHLORIDE 0.9% FLUSH
10.0000 mL | INTRAVENOUS | Status: DC | PRN
  Administered 2023-01-25: 10 mL

## 2023-01-25 MED ORDER — SODIUM CHLORIDE 0.9 % IV SOLN
150.0000 mg | Freq: Once | INTRAVENOUS | Status: AC
  Administered 2023-01-25: 150 mg via INTRAVENOUS
  Filled 2023-01-25: qty 150

## 2023-01-25 NOTE — Patient Instructions (Signed)
Fam-Trastuzumab Deruxtecan Injection What is this medication? FAM-TRASTUZUMAB DERUXTECAN (fam-tras TOOZ eu mab DER ux TEE kan) treats some types of cancer. It works by blocking a protein that causes cancer cells to grow and multiply. This helps to slow or stop the spread of cancer cells. This medicine may be used for other purposes; ask your health care provider or pharmacist if you have questions. COMMON BRAND NAME(S): ENHERTU What should I tell my care team before I take this medication? They need to know if you have any of these conditions: Heart disease Heart failure Infection, especially a viral infection, such as chickenpox, cold sores, or herpes Liver disease Lung or breathing disease, such as asthma or COPD An unusual or allergic reaction to fam-trastuzumab deruxtecan, other medications, foods, dyes, or preservatives Pregnant or trying to get pregnant Breast-feeding How should I use this medication? This medication is injected into a vein. It is given by your care team in a hospital or clinic setting. A special MedGuide will be given to you before each treatment. Be sure to read this information carefully each time. Talk to your care team about the use of this medication in children. Special care may be needed. Overdosage: If you think you have taken too much of this medicine contact a poison control center or emergency room at once. NOTE: This medicine is only for you. Do not share this medicine with others. What if I miss a dose? It is important not to miss your dose. Call your care team if you are unable to keep an appointment. What may interact with this medication? Interactions are not expected. This list may not describe all possible interactions. Give your health care provider a list of all the medicines, herbs, non-prescription drugs, or dietary supplements you use. Also tell them if you smoke, drink alcohol, or use illegal drugs. Some items may interact with your  medicine. What should I watch for while using this medication? Visit your care team for regular checks on your progress. Tell your care team if your symptoms do not start to get better or if they get worse. Your condition will be monitored carefully while you are receiving this medication. Do not become pregnant while taking this medication or for 7 months after stopping it. Women should inform their care team if they wish to become pregnant or think they might be pregnant. Men should not father a child while taking this medication and for 4 months after stopping it. There is potential for serious side effects to an unborn child. Talk to your care team for more information. Do not breast-feed an infant while taking this medication or for 7 months after the last dose. This medication has caused decreased sperm counts in some men. This may make it more difficult to father a child. Talk to your care team if you are concerned about your fertility. This medication may increase your risk to bruise or bleed. Call your care team if you notice any unusual bleeding. Be careful brushing or flossing your teeth or using a toothpick because you may get an infection or bleed more easily. If you have any dental work done, tell your dentist you are receiving this medication. This medication may cause dry eyes and blurred vision. If you wear contact lenses, you may feel some discomfort. Lubricating eye drops may help. See your care team if the problem does not go away or is severe. This medication may increase your risk of getting an infection. Call your care team for   advice if you get a fever, chills, sore throat, or other symptoms of a cold or flu. Do not treat yourself. Try to avoid being around people who are sick. Avoid taking medications that contain aspirin, acetaminophen, ibuprofen, naproxen, or ketoprofen unless instructed by your care team. These medications may hide a fever. What side effects may I notice from  receiving this medication? Side effects that you should report to your care team as soon as possible: Allergic reactions--skin rash, itching, hives, swelling of the face, lips, tongue, or throat Dry cough, shortness of breath or trouble breathing Infection--fever, chills, cough, sore throat, wounds that don't heal, pain or trouble when passing urine, general feeling of discomfort or being unwell Heart failure--shortness of breath, swelling of the ankles, feet, or hands, sudden weight gain, unusual weakness or fatigue Unusual bruising or bleeding Side effects that usually do not require medical attention (report these to your care team if they continue or are bothersome): Constipation Diarrhea Hair loss Muscle pain Nausea Vomiting This list may not describe all possible side effects. Call your doctor for medical advice about side effects. You may report side effects to FDA at 1-800-FDA-1088. Where should I keep my medication? This medication is given in a hospital or clinic. It will not be stored at home. NOTE: This sheet is a summary. It may not cover all possible information. If you have questions about this medicine, talk to your doctor, pharmacist, or health care provider.  2024 [employer]/Gold Standard (2021-05-16 00:00:00)  

## 2023-01-28 ENCOUNTER — Encounter: Payer: Self-pay | Admitting: Oncology

## 2023-01-29 ENCOUNTER — Other Ambulatory Visit: Payer: Self-pay | Admitting: Cardiology

## 2023-01-30 ENCOUNTER — Encounter: Payer: Self-pay | Admitting: Oncology

## 2023-01-30 NOTE — Progress Notes (Signed)
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 She was on adjuvant hormonal therapy with tamoxifen 20 mg daily from October 2015 to May 2020, but due to elevation of the liver transaminases, was switched to anastrazole.   Malignant neoplasm metastatic to bone [facility]) New bone metastases, November 2022, with multiple marrow replacing lesions within the proximal right femur within the ischium as well as the inferior rami. 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 Ki67 was 60%. PET imaging from December 19th revealed dominant finding of intensely hypermetabolic aggressive skeletal metastasis involving the calvarium, sternum, thoracic and lumbar spine, and pelvis, with potential pathologic fracture of the right femur with internal fixation. There is soft tissue extension into the anterior mediastinum associated with the manubrial metastasis. Multiple spinal vertebral body lesions have cortical destruction along the posterior margin. She started monthly Xgeva in December, and completed radiation therapy. She was receiving THP but cycle 4 was delayed and dose reduction made by 20% due to left leg skin infection. She continued with Herceptin/Perjeta until she had progression of disease. We then switched her to [facility] chemotherapy, but she has had  significant toxicities of nausea, vomiting, diarrhea, and hypokalemia. She was also placed on hormonal therapy with Faslodex injections.    Increased Left Hip Pain In January 2024, she developed increased pain of the left hip. She was found to have innumerable metastatic lesions throughout the pelvis and proximal femur. There is a non displaced pathologic fracture through a large lesion in the superior left acetabulum. She was referred to an orthopedic oncologist at [facility] and they recommended radiation and no surgery. She has now received her palliative radiation. She followed-up with [facility] in April and they do not recommend surgery but will continue surveillance.    Hypokalemia This was severe due to intolerance to oral supplement and persistent nausea, vomiting, and diarrhea. Potassium is normal today at 3.7 on oral supplement twice daily.    Hypocalcemia Her calcium is better but this is with taking 6 tablets daily.  Her vitamin D level was adequate.  I recommended she increase her calcium supplement to 3 tablets TID.  I stressed the importance of her calcium supplement.   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  She was found to be iron deficient in September 2023 and was given IV iron supplement. Her hemoglobin had decreased from 12.1 to 10.8 and now back to 12.1.    Abnormal Mammogram She does have thickening of the skin and trabecular thickening of the left breast. I find no significant change on physical exam and feel that a lot of this can be post radiation change. In view of her widespread bone metastases, I will not pursue further evaluation but will continue periodic breast exams.  On  CT scan this nodule of the left breast appears to be 12 mm, down from 13 mm in size   Large Cyst of the Right Kidney This arises from the lower pole and  has been present for a long time, previously measuring 11 mm on last scan from September, 2023. During her CT simulation for her left hip radiation, the physicist noted that the cyst is enlarged and so we did re-evaluate.  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    Elevated left hemidiaphragm This is a new finding since the scan of 2020 and may be related to mediastinal involvement.  Is associated with atelectasis at the left base and explains why I heard decreased breath sounds in this area.   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  For now we will continue the Enhertu and Faslodex but keep this in mind for the future.   Nausea/Vomiting She was having this daily despite adding Emend and taking regular doses of Zofran, Compazine, and Ativan. I therefore added olanzapine and start at 5mg  at bedtime and she seems to be doing better. I will also added Dexamethazone 4mg  to take twice daily for 3 to 5 days after chemo. This is doing better now.    Plan:  Patient also informed me that there is a film over her eye that causes blurry vision. I suggested she see her ophthalmologist and get his opinion. Her cardiologist stopped her Valsartan due to hypotension. She has a echocardiogram scheduled on July, 2nd. Her black stools have resolved. Her WBC is 6.8, hemoglobin improved from 10.8 to 12.1, and platelet count is 286,000 as of today. Her potassium is at 3.7 and the rest of her CMP is normal. She continues to take an oral potassium supplement twice daily. She will receive her Faslodex and Xgeva injections after her appointment with me. Her day 1 cycle 7 of Enhertu is scheduled on 02/15/2023. I will see her back on July, 3rd with CBC and CMP and she will be treated on July, 5th. She will then have repeat scans later in July.  The patient understands the  plans discussed today and is in agreement with them.  She knows to contact our office if she develops concerns prior to her next appointment.    I provided 16 minutes of face-to-face time during this encounter and > 50% was spent counseling as documented under my assessment and plan.     Dellia Beckwith, MD  [facility] AT [facility] [address] [place] [address] Dept: [phone] Dept Fax: [phone]   No orders of the defined types were placed in this encounter.    CHIEF COMPLAINT:  CC: A 52 year old female with history of breast cancer  Current Treatment:  THP; will proceed with HP  INTERVAL HISTORY:  Anna [name] is here today for repeat clinical assessment for her breast cancer metastatic to bone.  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 I mentioned that there are now 2 types of medication that target this type of mutation that we can use if the time comes. She completed radiation at the left hip on 12/25/2022. Patient states that she feels feels ok and complains of mild like nausea, and mild constipation, she will start a stool softneer today. I  advised her if the stool softener isn't enough then she can try MiraLAX. Patient also informed me that there is a film over her eye that causes blurry vision. I suggested she see her ophthalmologist and get his opinion. She has a echocardiogram scheduled on July, 2nd. Her black stools have resolved. Her WBC is 6.8, hemoglobin improved from 10.8 to 12.1, and platelet count is 286,000 as of today. Her potassium is at 3.7 and the rest of her CMP is normal. She continues to take an oral potassium supplement twice daily. She will receive her Faslodex and Xgeva injections after her appointment with me. Her day 1 cycle 7 of Enhertu is scheduled on 02/15/2023. I will see her back on July, 3rd with CBC and CMP and she will be treated on July, 5th. She will then  have repeat scans later in July. She denies signs of infection such as sore throat, sinus drainage, cough, or urinary symptoms.  She denies fevers or recurrent chills. She denies pain. She denies nausea, vomiting, chest pain, dyspnea or cough. Her appetite is good and her weight has decreased 5 pounds over last week .   I have reviewed the past medical history, past surgical history, social history and family history with the patient and they are unchanged from previous note.  ALLERGIES:  is allergic to reglan [metoclopramide].  MEDICATIONS:  Current Outpatient Medications  Medication Sig Dispense Refill   albuterol (VENTOLIN HFA) 108 (90 Base) MCG/ACT inhaler Inhale 2 puffs into the lungs every 6 (six) hours as needed for shortness of breath.     ARIPiprazole (ABILIFY) 5 MG tablet Take 2.5 mg by mouth daily.     aspirin 81 MG EC tablet Take 81 mg by mouth daily. Swallow whole.     atorvastatin (LIPITOR) 20 MG tablet TAKE ONE TABLET BY MOUTH EVERY DAY 90 tablet 0   augmented betamethasone dipropionate (DIPROLENE-AF) 0.05 % cream      calcium carbonate (TUMS - DOSED IN MG ELEMENTAL CALCIUM) 500 MG chewable tablet Chew 1 tablet by mouth daily. 4 gummies daily/ not tablet form .     CLENPIQ 10-3.5-12 MG-GM -GM/160ML SOLN      dapagliflozin propanediol (FARXIGA) 10 MG TABS tablet TAKE ONE TABLET BY MOUTH EVERY DAY 30 tablet 0   desvenlafaxine (PRISTIQ) 50 MG 24 hr tablet Take 50 mg by mouth daily.     dexamethasone (DECADRON) 4 MG tablet Take 1 tablet (4 mg total) by mouth 2 (two) times daily. For 5 days after chemotherapy 20 tablet 2   diphenoxylate-atropine (LOMOTIL) 2.5-0.025 MG tablet TAKE TWO TABLETS BY MOUTH FOUR TIMES A DAY AS NEEDED FOR DIARRHEA OR LOOSE STOOLS 100 tablet 1   furosemide (LASIX) 20 MG tablet Take 1 tablet (20 mg total) by mouth daily. (Patient taking differently: Take 20 mg by mouth as needed.) 30 tablet 0   gabapentin (NEURONTIN) 300 MG capsule Take 1 capsule (300 mg total)  by mouth at bedtime. 30 capsule 1   labetalol (NORMODYNE) 200 MG tablet Take 1 tablet (200 mg total) by mouth 2 (two) times daily. 180 tablet 3   loratadine (CLARITIN) 10 MG tablet TAKE ONE TABLET BY MOUTH DAILY 30 tablet 2   LORazepam (ATIVAN) 1 MG tablet TAKE ONE TABLET BY MOUTH EVERY 8 HOURS 30 tablet 1   magic mouthwash (lidocaine, diphenhydrAMINE, alum & mag hydroxide) suspension Take 5 mLs by mouth every 3 (three) hours as needed. Swish and spit; for throat/mouth pain     naloxone [facility])  nasal spray 4 mg/0.1 mL One spray in nostril as needed, may repeat every 2 to 3 minutes until medical assistance becomes available 4 each 1   nitroGLYCERIN (NITROSTAT) 0.4 MG SL tablet Place 0.4 mg under the tongue every 5 (five) minutes as needed for chest pain.     nystatin (MYCOSTATIN/NYSTOP) powder Apply 1 Application topically 2 (two) times daily.     OLANZapine (ZYPREXA) 5 MG tablet Take 1 tablet (5 mg total) by mouth at bedtime. 30 tablet 5   ondansetron (ZOFRAN) 8 MG tablet Take 1 tablet (8 mg total) by mouth every 8 (eight) hours as needed for nausea or vomiting. 60 tablet 5   Oxycodone HCl 10 MG TABS TAKE ONE TABLET BY MOUTH EVERY 4 HOURS AS NEEDED FOR PAIN 180 tablet 0   OZEMPIC, 0.25 OR 0.5 MG/DOSE, 2 MG/3ML SOPN Inject into the skin.     potassium chloride SA (KLOR-CON M) 20 MEQ tablet Take 1 tablet (20 mEq total) by mouth 2 (two) times daily. 60 tablet 0   prochlorperazine (COMPAZINE) 10 MG tablet TAKE ONE TABLET BY MOUTH EVERY 6 HOURS AS NEEDED FOR NAUSEA/VOMITING 90 tablet 3   Current Facility-Administered Medications  Medication Dose Route Frequency Provider Last Rate Last Admin   0.9 %  sodium chloride infusion  500 mL Intravenous Once Lynann Bologna, MD       technetium sestamibi generic (CARDIOLITE) injection 10.5 millicurie  10.5 millicurie Intravenous Once PRN Revankar, Aundra Dubin, MD        HISTORY OF PRESENT ILLNESS:   Oncology History  Malignant neoplasm of upper-outer quadrant of  left female breast (HCC)  07/23/2013 Initial Diagnosis   Malignant neoplasm of upper-outer quadrant of left female breast (HCC)   07/23/2013 Cancer Staging   Staging form: Breast, AJCC 7th Edition - Clinical stage from 07/23/2013: Stage IIB (T2, N1, M0) - Signed by Dellia Beckwith, MD on 07/04/2020 Prognostic indicators: Pos LVI   09/22/2021 - 04/06/2022 Chemotherapy   Patient is on Treatment Plan : BREAST  Trastuzumab + Pertuzumab q21d      09/22/2021 - 09/21/2022 Chemotherapy   Patient is on Treatment Plan : BREAST Trastuzumab + Pertuzumab q21d     10/12/2022 -  Chemotherapy   Patient is on Treatment Plan : BREAST METASTATIC Fam-Trastuzumab Deruxtecan-nxki (Enhertu) (5.4) q21d     Malignant neoplasm metastatic to bone (HCC)  06/28/2021 Initial Diagnosis   Bone metastases (HCC)   09/22/2021 - 04/06/2022 Chemotherapy   Patient is on Treatment Plan : BREAST  Trastuzumab + Pertuzumab q21d      09/22/2021 - 09/21/2022 Chemotherapy   Patient is on Treatment Plan : BREAST Trastuzumab + Pertuzumab q21d     05/11/2022 Imaging   CT chest, abdomen and pelvis:  IMPRESSION:  1. Widespread bony metastatic disease shows increased sclerosis  since previous imaging. This is likely related to response to  therapy in the interval. There is a single lesion along the LEFT  iliac which shows continued lytic change and warrants attention on  follow-up  2. Interval development of pathologic fractures at in the upper  thoracic spine and at the thoracolumbar junction with associated  kyphosis.  3. No signs of solid organ or nodal metastatic disease.  4. Marked elevation of the LEFT hemidiaphragm as on the prior PET.  5. Aortic atherosclerosis.    10/12/2022 -  Chemotherapy   Patient is on Treatment Plan : BREAST METASTATIC Fam-Trastuzumab Deruxtecan-nxki (Enhertu) (5.4) q21d  REVIEW OF SYSTEMS:  Review of Systems  Constitutional: Negative.  Negative for appetite change, chills, diaphoresis,  fatigue, fever and unexpected weight change.  HENT:  Negative.  Negative for hearing loss, lump/mass, mouth sores, nosebleeds, sore throat, tinnitus, trouble swallowing and voice change.   Eyes:  Positive for eye problems (blurry). Negative for icterus.  Respiratory: Negative.  Negative for chest tightness, cough, hemoptysis, shortness of breath (mild) and wheezing.   Cardiovascular: Negative.  Negative for chest pain, leg swelling and palpitations.  Gastrointestinal:  Positive for constipation and nausea (improved). Negative for abdominal distention, abdominal pain, blood in stool, diarrhea, rectal pain and vomiting.  Endocrine: Negative.   Genitourinary: Negative.  Negative for bladder incontinence, difficulty urinating, dyspareunia, dysuria, frequency, hematuria, menstrual problem, nocturia, pelvic pain, vaginal bleeding and vaginal discharge.   Musculoskeletal:  Positive for back pain (lower back pain, 4/10). Negative for arthralgias, flank pain, gait problem, myalgias, neck pain and neck stiffness.  Skin:  Negative for itching, rash and wound.  Neurological: Negative.  Negative for dizziness, extremity weakness, gait problem, headaches, light-headedness, numbness, seizures and speech difficulty.  Hematological: Negative.  Negative for adenopathy. Does not bruise/bleed easily.  Psychiatric/Behavioral: Negative.  Negative for confusion, decreased concentration, depression, sleep disturbance and suicidal ideas. The patient is not nervous/anxious.    VITALS:  Blood pressure 106/66, pulse 95, temperature 98.2 F (36.8 C), temperature source Oral, resp. rate 16, height 4\' 8"  (1.422 m), weight 133 lb 9.6 oz (60.6 kg), last menstrual period 08/18/2013, SpO2 94 %.  Wt Readings from Last 3 Encounters:  02/01/23 134 lb 0.6 oz (60.8 kg)  02/01/23 133 lb 9.6 oz (60.6 kg)  01/31/23 135 lb 3.2 oz (61.3 kg)    Body mass index is 29.95 kg/m.  Performance status (ECOG): 1 - Symptomatic but completely  ambulatory  PHYSICAL EXAM:  Physical Exam Vitals and nursing note reviewed. Exam conducted with a chaperone present.  Constitutional:      General: She is not in acute distress.    Appearance: Normal appearance. She is normal weight. She is not ill-appearing, toxic-appearing or diaphoretic.  HENT:     Head: Normocephalic and atraumatic.     Right Ear: Tympanic membrane, ear canal and external ear normal. There is no impacted cerumen.     Left Ear: Tympanic membrane, ear canal and external ear normal. There is no impacted cerumen.     Nose: Nose normal. No congestion or rhinorrhea.     Mouth/Throat:     Mouth: Mucous membranes are moist.     Pharynx: Oropharynx is clear. No oropharyngeal exudate or posterior oropharyngeal erythema.  Eyes:     General: No scleral icterus.       Right eye: No discharge.        Left eye: No discharge.     Extraocular Movements: Extraocular movements intact.     Conjunctiva/sclera: Conjunctivae normal.     Pupils: Pupils are equal, round, and reactive to light.  Neck:     Vascular: No carotid bruit.     Comments: Chronic flexion of the neck. Cardiovascular:     Rate and Rhythm: Normal rate and regular rhythm.     Pulses: Normal pulses.     Heart sounds: Normal heart sounds. No murmur heard.    No friction rub. No gallop.  Pulmonary:     Effort: Pulmonary effort is normal. No respiratory distress.     Breath sounds: Normal breath sounds. No stridor or decreased air movement. No wheezing, rhonchi or  rales.  Chest:     Chest wall: No tenderness.     Comments: Well healed scar in the upper outer of the left breast. Firmness diffusely in the left breast consistent with post radiation changes and the thickening of skin in both breast.  Abdominal:     General: Bowel sounds are normal. There is no distension.     Palpations: Abdomen is soft. There is no hepatomegaly, splenomegaly or mass.     Tenderness: There is no abdominal tenderness. There is no right  CVA tenderness, left CVA tenderness, guarding or rebound.     Hernia: No hernia is present.  Musculoskeletal:        General: No swelling, tenderness, deformity or signs of injury. Normal range of motion.     Cervical back: Normal range of motion and neck supple. No rigidity or tenderness.     Right lower leg: No edema.     Left lower leg: No edema.  Lymphadenopathy:     Cervical: No cervical adenopathy.     Right cervical: No superficial, deep or posterior cervical adenopathy.    Left cervical: No superficial, deep or posterior cervical adenopathy.     Upper Body:     Right upper body: No supraclavicular, axillary or pectoral adenopathy.     Left upper body: No supraclavicular, axillary or pectoral adenopathy.  Skin:    General: Skin is warm and dry.     Coloration: Skin is not jaundiced or pale.     Findings: No bruising, erythema, lesion or rash.     Comments: Bilateral lower extremities are red and swollen with induration, skin is dry and peeling but overall improved.   Neurological:     General: No focal deficit present.     Mental Status: She is alert and oriented to person, place, and time. Mental status is at baseline.     Cranial Nerves: No cranial nerve deficit.     Sensory: No sensory deficit.     Motor: No weakness.     Coordination: Coordination normal.     Gait: Gait normal.     Deep Tendon Reflexes: Reflexes normal.  Psychiatric:        Mood and Affect: Mood normal.        Behavior: Behavior normal.        Thought Content: Thought content normal.        Judgment: Judgment normal.    LABORATORY DATA:  I have reviewed the data as listed  Component Ref Range & Units 2 wk ago (11/21/22) 2 mo ago (10/09/22) 3 mo ago (08/29/22) 5 mo ago (06/28/22) 1 yr ago (09/06/21) 1 yr ago (07/20/21)  CA 27.29 0.0 - 38.6 U/mL 46.2 High  53.8 High  CM 57.7 High  CM 50.9 High  CM 106.6 High  CM 75.6 High  C      Component Value Date/Time   NA 137 02/01/2023 0000   K 3.7  02/01/2023 0000   CL 103 02/01/2023 0000   CO2 29 (A) 02/01/2023 0000   GLUCOSE 96 01/22/2023 1428   BUN 18 02/01/2023 0000   CREATININE 0.5 02/01/2023 0000   CREATININE 0.57 01/22/2023 1428   CALCIUM 8.9 02/01/2023 0000   PROT 5.7 (L) 01/22/2023 1428   ALBUMIN 3.1 (A) 02/01/2023 0000   AST 30 02/01/2023 0000   AST 22 01/22/2023 1428   ALT 33 02/01/2023 0000   ALT 23 01/22/2023 1428   ALKPHOS 70 02/01/2023 0000   BILITOT 0.1 (L)  01/22/2023 1428   GFRNONAA >60 01/22/2023 1428   No results found for: "SPEP", "UPEP"  Lab Results  Component Value Date   WBC 6.8 02/01/2023   NEUTROABS 5.71 02/01/2023   HGB 12.1 02/01/2023   HCT 36 02/01/2023   MCV 96.7 01/22/2023   PLT 286 02/01/2023     Chemistry      Component Value Date/Time   NA 137 02/01/2023 0000   K 3.7 02/01/2023 0000   CL 103 02/01/2023 0000   CO2 29 (A) 02/01/2023 0000   BUN 18 02/01/2023 0000   CREATININE 0.5 02/01/2023 0000   CREATININE 0.57 01/22/2023 1428   GLU 116 02/01/2023 0000      Component Value Date/Time   CALCIUM 8.9 02/01/2023 0000   ALKPHOS 70 02/01/2023 0000   AST 30 02/01/2023 0000   AST 22 01/22/2023 1428   ALT 33 02/01/2023 0000   ALT 23 01/22/2023 1428   BILITOT 0.1 (L) 01/22/2023 1428               Component Ref Range & Units (01/22/23) 2 wk ago (01/14/23) 3 wk ago (01/09/23) 3 wk ago (01/03/23) 6 mo ago (07/18/22) 6 mo ago (07/11/22) 7 mo ago (06/26/22) 7 mo ago (06/05/22)  Magnesium 1.7 - 2.4 mg/dL 1.7 2.0 1.8 CM 1.6 Low  CM 2.0 CM 2.0 CM 2.2 CM 2.2 R      RADIOGRAPHIC STUDIES:   Echocardiogram: 10/18/2022 IMPRESSIONS:  1. TDS, decrease LVEF as compared to echo from 07/16/2022. Left  ventricular ejection fraction, by estimation, is 50 to 55%. The left  ventricle has low normal function. The left ventricle has no regional wall  motion abnormalities. Left ventricular  diastolic parameters are consistent with Grade I diastolic dysfunction  (impaired relaxation).   2. Right  ventricular systolic function is normal. The right ventricular  size is normal. There is normal pulmonary artery systolic pressure.   3. The mitral valve is normal in structure. No evidence of mitral valve  regurgitation. No evidence of mitral stenosis.   4. The aortic valve is normal in structure. Aortic valve regurgitation is  not visualized. No aortic stenosis is present.   5. The inferior vena cava is normal in size with greater than 50%  respiratory variability, suggesting right atrial pressure of 3 mmHg.        I,Jasmine M Lassiter,acting as a scribe for Dellia Beckwith, MD.,have documented all relevant documentation on the behalf of Dellia Beckwith, MD,as directed by  Dellia Beckwith, MD while in the presence of Dellia Beckwith, MD.

## 2023-01-31 ENCOUNTER — Encounter: Payer: Self-pay | Admitting: Adult Health

## 2023-01-31 ENCOUNTER — Ambulatory Visit: Payer: BC Managed Care – PPO | Attending: Cardiology | Admitting: Adult Health

## 2023-01-31 VITALS — BP 112/78 | HR 108 | Ht <= 58 in | Wt 135.2 lb

## 2023-01-31 DIAGNOSIS — I10 Essential (primary) hypertension: Secondary | ICD-10-CM

## 2023-01-31 DIAGNOSIS — Z170 Estrogen receptor positive status [ER+]: Secondary | ICD-10-CM

## 2023-01-31 DIAGNOSIS — R000 Tachycardia, unspecified: Secondary | ICD-10-CM | POA: Diagnosis not present

## 2023-01-31 DIAGNOSIS — E119 Type 2 diabetes mellitus without complications: Secondary | ICD-10-CM | POA: Diagnosis not present

## 2023-01-31 DIAGNOSIS — I9589 Other hypotension: Secondary | ICD-10-CM | POA: Diagnosis not present

## 2023-01-31 DIAGNOSIS — C50412 Malignant neoplasm of upper-outer quadrant of left female breast: Secondary | ICD-10-CM

## 2023-01-31 NOTE — Patient Instructions (Signed)
Medication Instructions:  Stop Valsartan *If you need a refill on your cardiac medications before your next appointment, please call your pharmacy*   Lab Work: No labs If you have labs (blood work) drawn today and your tests are completely normal, you will receive your results only by: MyChart Message (if you have MyChart) OR A paper copy in the mail If you have any lab test that is abnormal or we need to change your treatment, we will call you to review the results.   Testing/Procedures: No Testing   Follow-Up: At [facility], you and your health needs are our priority.  As part of our continuing mission to provide you with exceptional heart care, we have created designated Provider Care Teams.  These Care Teams include your primary Cardiologist (physician) and Advanced Practice Providers (APPs -  Physician Assistants and Nurse Practitioners) who all work together to provide you with the care you need, when you need it.  We recommend signing up for the patient portal called "MyChart".  Sign up information is provided on this After Visit Summary.  MyChart is used to connect with patients for Virtual Visits (Telemedicine).  Patients are able to view lab/test results, encounter notes, upcoming appointments, etc.  Non-urgent messages can be sent to your provider as well.   To learn more about what you can do with MyChart, go to [email or web address].    Your next appointment:   3 month(s)  Provider:   Thomasene Ripple, DO

## 2023-02-01 ENCOUNTER — Encounter: Payer: Self-pay | Admitting: Oncology

## 2023-02-01 ENCOUNTER — Inpatient Hospital Stay (INDEPENDENT_AMBULATORY_CARE_PROVIDER_SITE_OTHER): Payer: BC Managed Care – PPO | Admitting: Oncology

## 2023-02-01 ENCOUNTER — Inpatient Hospital Stay: Payer: BC Managed Care – PPO

## 2023-02-01 ENCOUNTER — Other Ambulatory Visit: Payer: Self-pay | Admitting: Oncology

## 2023-02-01 ENCOUNTER — Other Ambulatory Visit: Payer: Self-pay

## 2023-02-01 ENCOUNTER — Telehealth: Payer: Self-pay

## 2023-02-01 VITALS — BP 114/77 | HR 104 | Temp 98.6°F | Resp 12 | Ht <= 58 in | Wt 134.0 lb

## 2023-02-01 VITALS — BP 106/66 | HR 95 | Temp 98.2°F | Resp 16 | Ht <= 58 in | Wt 133.6 lb

## 2023-02-01 DIAGNOSIS — C50412 Malignant neoplasm of upper-outer quadrant of left female breast: Secondary | ICD-10-CM | POA: Diagnosis not present

## 2023-02-01 DIAGNOSIS — Z170 Estrogen receptor positive status [ER+]: Secondary | ICD-10-CM

## 2023-02-01 DIAGNOSIS — Z5111 Encounter for antineoplastic chemotherapy: Secondary | ICD-10-CM | POA: Diagnosis not present

## 2023-02-01 DIAGNOSIS — C7951 Secondary malignant neoplasm of bone: Secondary | ICD-10-CM

## 2023-02-01 LAB — BASIC METABOLIC PANEL
BUN: 18 (ref 4–21)
CO2: 29 — AB (ref 13–22)
Chloride: 103 (ref 99–108)
Creatinine: 0.5 (ref 0.5–1.1)
Glucose: 116
Potassium: 3.7 mEq/L (ref 3.5–5.1)
Sodium: 137 (ref 137–147)

## 2023-02-01 LAB — CBC AND DIFFERENTIAL
HCT: 36 (ref 36–46)
Hemoglobin: 12.1 (ref 12.0–16.0)
Neutrophils Absolute: 5.71
Platelets: 286 10*3/uL (ref 150–400)
WBC: 6.8

## 2023-02-01 LAB — HEPATIC FUNCTION PANEL
ALT: 33 U/L (ref 7–35)
AST: 30 (ref 13–35)
Alkaline Phosphatase: 70 (ref 25–125)
Bilirubin, Total: 0.5

## 2023-02-01 LAB — COMPREHENSIVE METABOLIC PANEL
Albumin: 3.1 — AB (ref 3.5–5.0)
Calcium: 8.9 (ref 8.7–10.7)
EGFR: 60

## 2023-02-01 LAB — MAGNESIUM: Magnesium: 1.9

## 2023-02-01 LAB — CBC: RBC: 3.92 (ref 3.87–5.11)

## 2023-02-01 MED ORDER — FULVESTRANT 250 MG/5ML IM SOSY
500.0000 mg | PREFILLED_SYRINGE | Freq: Once | INTRAMUSCULAR | Status: AC
  Administered 2023-02-01: 500 mg via INTRAMUSCULAR
  Filled 2023-02-01: qty 10

## 2023-02-01 NOTE — Telephone Encounter (Signed)
-----   Message from Dellia Beckwith, MD sent at 02/01/2023  6:24 AM EDT ----- Regarding: ECHO Do we have her ECHO results?

## 2023-02-01 NOTE — Telephone Encounter (Signed)
Scheduled for 02/12/23 at Cardiology

## 2023-02-06 ENCOUNTER — Telehealth: Payer: Self-pay | Admitting: Oncology

## 2023-02-06 NOTE — Telephone Encounter (Signed)
CT C/A/P has been scheduled for 03/01/23 @10  am; Checking in @ 9 am   Notified pt of date,time and instructions. Marland Kitchen

## 2023-02-08 ENCOUNTER — Encounter: Payer: Self-pay | Admitting: Oncology

## 2023-02-11 ENCOUNTER — Encounter: Payer: Self-pay | Admitting: Oncology

## 2023-02-12 ENCOUNTER — Inpatient Hospital Stay: Payer: BC Managed Care – PPO | Attending: Hematology and Oncology

## 2023-02-12 ENCOUNTER — Ambulatory Visit: Payer: BC Managed Care – PPO | Attending: Oncology

## 2023-02-12 DIAGNOSIS — Z79811 Long term (current) use of aromatase inhibitors: Secondary | ICD-10-CM | POA: Diagnosis not present

## 2023-02-12 DIAGNOSIS — Z5112 Encounter for antineoplastic immunotherapy: Secondary | ICD-10-CM | POA: Diagnosis not present

## 2023-02-12 DIAGNOSIS — C50412 Malignant neoplasm of upper-outer quadrant of left female breast: Secondary | ICD-10-CM | POA: Insufficient documentation

## 2023-02-12 DIAGNOSIS — Z0189 Encounter for other specified special examinations: Secondary | ICD-10-CM

## 2023-02-12 DIAGNOSIS — N281 Cyst of kidney, acquired: Secondary | ICD-10-CM | POA: Diagnosis not present

## 2023-02-12 DIAGNOSIS — D649 Anemia, unspecified: Secondary | ICD-10-CM | POA: Diagnosis not present

## 2023-02-12 DIAGNOSIS — Z170 Estrogen receptor positive status [ER+]: Secondary | ICD-10-CM | POA: Diagnosis not present

## 2023-02-12 DIAGNOSIS — E876 Hypokalemia: Secondary | ICD-10-CM | POA: Diagnosis not present

## 2023-02-12 DIAGNOSIS — Z5111 Encounter for antineoplastic chemotherapy: Secondary | ICD-10-CM | POA: Diagnosis present

## 2023-02-12 DIAGNOSIS — C7951 Secondary malignant neoplasm of bone: Secondary | ICD-10-CM

## 2023-02-12 LAB — CMP (CANCER CENTER ONLY)
ALT: 18 U/L (ref 0–44)
AST: 15 U/L (ref 15–41)
Albumin: 3.3 g/dL — ABNORMAL LOW (ref 3.5–5.0)
Alkaline Phosphatase: 67 U/L (ref 38–126)
Anion gap: 9 (ref 5–15)
BUN: 11 mg/dL (ref 6–20)
CO2: 27 mmol/L (ref 22–32)
Calcium: 8.6 mg/dL — ABNORMAL LOW (ref 8.9–10.3)
Chloride: 104 mmol/L (ref 98–111)
Creatinine: 0.64 mg/dL (ref 0.44–1.00)
GFR, Estimated: >60 mL/min (ref >60)
Glucose, Bld: 100 mg/dL — ABNORMAL HIGH (ref 70–99)
Potassium: 4.1 mmol/L (ref 3.5–5.1)
Sodium: 140 mmol/L (ref 135–145)
Total Bilirubin: 0.4 mg/dL (ref 0.3–1.2)
Total Protein: 6 g/dL — ABNORMAL LOW (ref 6.5–8.1)

## 2023-02-12 LAB — CBC WITH DIFFERENTIAL (CANCER CENTER ONLY)
Abs Immature Granulocytes: 0.02 10*3/uL (ref 0.00–0.07)
Basophils Absolute: 0 10*3/uL (ref 0.0–0.1)
Basophils Relative: 1 %
Eosinophils Absolute: 0.1 10*3/uL (ref 0.0–0.5)
Eosinophils Relative: 3 %
HCT: 36.2 % (ref 36.0–46.0)
Hemoglobin: 11.1 g/dL — ABNORMAL LOW (ref 12.0–15.0)
Immature Granulocytes: 1 %
Lymphocytes Relative: 12 %
Lymphs Abs: 0.4 10*3/uL — ABNORMAL LOW (ref 0.7–4.0)
MCH: 30.3 pg (ref 26.0–34.0)
MCHC: 30.7 g/dL (ref 30.0–36.0)
MCV: 98.9 fL (ref 80.0–100.0)
Monocytes Absolute: 0.4 10*3/uL (ref 0.1–1.0)
Monocytes Relative: 11 %
Neutro Abs: 2.7 10*3/uL (ref 1.7–7.7)
Neutrophils Relative %: 72 %
Platelet Count: 296 10*3/uL (ref 150–400)
RBC: 3.66 MIL/uL — ABNORMAL LOW (ref 3.87–5.11)
RDW: 17.2 % — ABNORMAL HIGH (ref 11.5–15.5)
WBC Count: 3.8 10*3/uL — ABNORMAL LOW (ref 4.0–10.5)
nRBC: 0 % (ref 0.0–0.2)

## 2023-02-12 LAB — MAGNESIUM: Magnesium: 1.9 mg/dL (ref 1.7–2.4)

## 2023-02-12 LAB — ECHOCARDIOGRAM COMPLETE: S' Lateral: 2.4 cm

## 2023-02-12 MED ORDER — PERFLUTREN LIPID MICROSPHERE
1.0000 mL | INTRAVENOUS | Status: AC | PRN
Start: 2023-02-12 — End: 2023-02-12
  Administered 2023-02-12: 5 mL via INTRAVENOUS

## 2023-02-13 ENCOUNTER — Encounter: Payer: Self-pay | Admitting: Oncology

## 2023-02-13 ENCOUNTER — Inpatient Hospital Stay: Payer: BC Managed Care – PPO | Admitting: Oncology

## 2023-02-13 VITALS — BP 103/74 | HR 98 | Temp 98.6°F | Resp 18 | Ht <= 58 in | Wt 134.2 lb

## 2023-02-13 DIAGNOSIS — Z170 Estrogen receptor positive status [ER+]: Secondary | ICD-10-CM | POA: Diagnosis not present

## 2023-02-13 DIAGNOSIS — C50412 Malignant neoplasm of upper-outer quadrant of left female breast: Secondary | ICD-10-CM

## 2023-02-13 DIAGNOSIS — C7951 Secondary malignant neoplasm of bone: Secondary | ICD-10-CM | POA: Diagnosis not present

## 2023-02-13 MED FILL — Dexamethasone Sodium Phosphate Inj 100 MG/10ML: INTRAMUSCULAR | Qty: 1 | Status: AC

## 2023-02-13 MED FILL — Fosaprepitant Dimeglumine For IV Infusion 150 MG (Base Eq): INTRAVENOUS | Qty: 5 | Status: AC

## 2023-02-13 NOTE — Progress Notes (Signed)
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 She was on adjuvant hormonal therapy with tamoxifen 20 mg daily from October 2015 to May 2020, but due to elevation of the liver transaminases, was switched to anastrazole.   Malignant neoplasm metastatic to bone [facility]) New bone metastases, November 2022, with multiple marrow replacing lesions within the proximal right femur within the ischium as well as the inferior rami. 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 Ki67 was 60%. PET imaging from December 19th revealed dominant finding of intensely hypermetabolic aggressive skeletal metastasis involving the calvarium, sternum, thoracic and lumbar spine, and pelvis, with potential pathologic fracture of the right femur with internal fixation. There is soft tissue extension into the anterior mediastinum associated with the manubrial metastasis. Multiple spinal vertebral body lesions have cortical destruction along the posterior margin. She started monthly Xgeva in December, and completed radiation therapy. She was receiving THP but cycle 4 was delayed and dose reduction made by 20% due to left leg skin infection. She continued with Herceptin/Perjeta until she had progression of disease. We then switched her to [facility] chemotherapy, but she has had  significant toxicities of nausea, vomiting, diarrhea, and hypokalemia. These have been slowly improving and we will re scan her next time to assess her response. She was also placed on hormonal therapy with Faslodex injections.    Increased Left Hip Pain In January 2024, she developed increased pain of the left hip. She was found to have innumerable metastatic lesions throughout the pelvis and proximal femur. There is a non displaced pathologic fracture through a large lesion in the superior left acetabulum. She was referred to an orthopedic oncologist at [facility] and they recommended radiation and no surgery. She has now received her palliative radiation. She followed-up with [facility] in April and they do not recommend surgery but will continue surveillance.    Hypokalemia This was severe due to intolerance to oral supplement and persistent nausea, vomiting, and diarrhea. Potassium is normal today at 4.1 on oral supplement twice daily.    Hypocalcemia Her calcium is better but this is with taking 6 tablets daily.  Her vitamin D level was adequate.  I recommended she increase her calcium supplement to 1 tablets TID.  I stressed the importance of her calcium supplement.   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  She was found to be iron deficient in September 2023 and was given IV iron supplement. Her hemoglobin had decreased from 12.1 to 10.8 and back to 12.1 and now down to 11.1.   Abnormal Mammogram She does have thickening of the skin and trabecular thickening of the left breast. I find no significant change on physical exam and feel that a lot of this can be post radiation change. In  view of her widespread bone metastases, I will not pursue further evaluation but will continue periodic breast exams.  On CT scan this nodule of the left breast appears to  be 12 mm, down from 13 mm in size   Large Cyst of the Right Kidney This arises from the lower pole and has been present for a long time, previously measuring 11 mm on last scan from September, 2023. During her CT simulation for her left hip radiation, the physicist noted that the cyst is enlarged and so we did re-evaluate.  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    Elevated left hemidiaphragm This is a new finding since the scan of 2020 and may be related to mediastinal involvement.  Is associated with atelectasis at the left base and explains why I heard decreased breath sounds in this area.   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  For now we will continue the Enhertu and Faslodex but keep this in mind for the future.   Nausea/Vomiting She was having this daily despite adding Emend and taking regular doses of Zofran, Compazine, and Ativan. I therefore added olanzapine and start at 5mg  at bedtime and she seems to be doing better. I will also added Dexamethazone 4mg  to take twice daily for 3 to 5 days after chemo. This is doing better now.   Recurrent leg ulcers She had severe problems with this last year but it had finally healed with the assistance of the [facility].  Now she has recurrent ulceration of the left lateral calf and an early lesion of the right lower leg as well. They are trying Medihoney but we will need referral to the Wound Center again.   Plan:   We will continue the Enhertu for now. She completed radiation at the left hip on 12/25/2022. Patient states that she feels feels good and says her chronic back pain has reduced in severity and is now a 3/10. Her hemoglobin is 11.1, WBC is 6.8 and platelet count is 296,000. Her calcium was low at 8.6, potassium 4.1, and total protein is 6.0. The liver tests are normal. Her  hypocalcemia could be due to her recent Xgeva injection. She will continue to receive her Faslodex and Xgeva injections monthly. She is currently taking Caltrate twice a day and I recommended her to increase it to 3 times daily. She is taking still Miralax occasionally for constipation and I advised her to take it daily. She had an Echocardiogram on July 2nd and her ejection fraction is 60-65%. Her day 1 cycle 7 of Enhertu is scheduled for 02/15/2023. I will see Her back in 3 weeks with CBC, CMP and CT scan of chest, abdomen and pelvis. The patient understands the plans discussed today and is in agreement with them.  She knows to contact our office if she develops concerns prior to her next appointment.   I provided 20 minutes of face-to-face time during this encounter and > 50% was spent counseling as documented under my assessment and plan.     Dellia Beckwith, MD  [facility] AT [facility] [address] [place] [address] Dept: [phone] Dept Fax: [phone]   No orders of the defined types were placed in this encounter.    CHIEF COMPLAINT:  CC: A 52-year  old female with history of breast cancer  Current Treatment:  THP; will proceed with HP  INTERVAL HISTORY:  Anna [name] is here today for repeat clinical assessment for her breast cancer metastatic to bone.  Her Guardant360 results came back and revealed her to have a PIK3CA mutation. I mentioned that there are now 2 types of medication that target this type of mutation that we can use. We will continue the Enhertu for now. She completed radiation at the left hip on 12/25/2022. Patient states that she feels feels good and says her chronic back pain has reduced in severity and is now a 3/10. Her hemoglobin is 11.1, WBC is 6.8 and platelet count is 296,000. Her calcium was low at 8.6, potassium 4.1, and total protein is 6.0. The liver tests are normal. Her hypocalcemia could be due to  her recent Xgeva injection. She will receive her Faslodex and Xgeva injections monthly. She is currently taking Caltrate twice a day and I recommended her to increase it to 3 times daily. She is taking still Miralax occasionally for constipation and I advised her to take it daily. She had an Echocardiogram on July 2nd and her ejection fraction is 60-65%. Her day 1 cycle 7 of Enhertu is scheduled for 02/15/2023. I will see Her back in 3 weeks with CBC, CMP and CT scan of chest, abdomen and pelvis. She  denies signs of infections such as sore throat, sinus drainage, cough or urinary symptoms. She  denies fever or recurrent chills. She  also deny nausea, vomiting, chest pain dyspnea or cough. Her  appetite is not bad and Her  weight has been stable    I have reviewed the past medical history, past surgical history, social history and family history with the patient and they are unchanged from previous note.  ALLERGIES:  is allergic to reglan [metoclopramide].  MEDICATIONS:  Current Outpatient Medications  Medication Sig Dispense Refill   albuterol (VENTOLIN HFA) 108 (90 Base) MCG/ACT inhaler Inhale 2 puffs into the lungs every 6 (six) hours as needed for shortness of breath.     ARIPiprazole (ABILIFY) 5 MG tablet Take 2.5 mg by mouth daily.     aspirin 81 MG EC tablet Take 81 mg by mouth daily. Swallow whole.     atorvastatin (LIPITOR) 20 MG tablet TAKE ONE TABLET BY MOUTH EVERY DAY 90 tablet 0   augmented betamethasone dipropionate (DIPROLENE-AF) 0.05 % cream      calcium carbonate (TUMS - DOSED IN MG ELEMENTAL CALCIUM) 500 MG chewable tablet Chew 1 tablet by mouth daily. 4 gummies daily/ not tablet form .     CLENPIQ 10-3.5-12 MG-GM -GM/160ML SOLN      dapagliflozin propanediol (FARXIGA) 10 MG TABS tablet TAKE ONE TABLET BY MOUTH EVERY DAY 30 tablet 0   desvenlafaxine (PRISTIQ) 50 MG 24 hr tablet Take 50 mg by mouth daily.     dexamethasone (DECADRON) 4 MG tablet Take 1 tablet (4 mg total) by mouth  2 (two) times daily. For 5 days after chemotherapy 20 tablet 2   diphenoxylate-atropine (LOMOTIL) 2.5-0.025 MG tablet TAKE TWO TABLETS BY MOUTH FOUR TIMES A DAY AS NEEDED FOR DIARRHEA OR LOOSE STOOLS 100 tablet 1   furosemide (LASIX) 20 MG tablet Take 1 tablet (20 mg total) by mouth daily. (Patient taking differently: Take 20 mg by mouth as needed.) 30 tablet 0   gabapentin (NEURONTIN) 300 MG capsule Take 1 capsule (300 mg total) by mouth at bedtime. 30 capsule 1   labetalol (  NORMODYNE) 200 MG tablet Take 1 tablet (200 mg total) by mouth 2 (two) times daily. 180 tablet 3   loratadine (CLARITIN) 10 MG tablet TAKE ONE TABLET BY MOUTH DAILY 30 tablet 2   LORazepam (ATIVAN) 1 MG tablet TAKE ONE TABLET BY MOUTH EVERY 8 HOURS 30 tablet 1   magic mouthwash (lidocaine, diphenhydrAMINE, alum & mag hydroxide) suspension Take 5 mLs by mouth every 3 (three) hours as needed. Swish and spit; for throat/mouth pain     naloxone (NARCAN) nasal spray 4 mg/0.1 mL One spray in nostril as needed, may repeat every 2 to 3 minutes until medical assistance becomes available 4 each 1   nitroGLYCERIN (NITROSTAT) 0.4 MG SL tablet Place 0.4 mg under the tongue every 5 (five) minutes as needed for chest pain.     nystatin (MYCOSTATIN/NYSTOP) powder Apply 1 Application topically 2 (two) times daily.     OLANZapine (ZYPREXA) 5 MG tablet Take 1 tablet (5 mg total) by mouth at bedtime. 30 tablet 5   ondansetron (ZOFRAN) 8 MG tablet Take 1 tablet (8 mg total) by mouth every 8 (eight) hours as needed for nausea or vomiting. 60 tablet 5   Oxycodone HCl 10 MG TABS TAKE ONE TABLET BY MOUTH EVERY 4 HOURS AS NEEDED FOR PAIN 180 tablet 0   OZEMPIC, 0.25 OR 0.5 MG/DOSE, 2 MG/3ML SOPN Inject into the skin.     potassium chloride SA (KLOR-CON M) 20 MEQ tablet Take 1 tablet (20 mEq total) by mouth 2 (two) times daily. 60 tablet 0   prochlorperazine (COMPAZINE) 10 MG tablet TAKE ONE TABLET BY MOUTH EVERY 6 HOURS AS NEEDED FOR NAUSEA/VOMITING 90  tablet 3   Current Facility-Administered Medications  Medication Dose Route Frequency Provider Last Rate Last Admin   0.9 %  sodium chloride infusion  500 mL Intravenous Once Lynann Bologna, MD       technetium sestamibi generic (CARDIOLITE) injection 10.5 millicurie  10.5 millicurie Intravenous Once PRN Revankar, Aundra Dubin, MD        HISTORY OF PRESENT ILLNESS:   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 (HCC)   07/23/2013 Cancer Staging   Staging form: Breast, AJCC 7th Edition - Clinical stage from 07/23/2013: Stage IIB (T2, N1, M0) - Signed by Dellia Beckwith, MD on 07/04/2020 Prognostic indicators: Pos LVI   09/22/2021 - 04/06/2022 Chemotherapy   Patient is on Treatment Plan : BREAST  Trastuzumab + Pertuzumab q21d      09/22/2021 - 09/21/2022 Chemotherapy   Patient is on Treatment Plan : BREAST Trastuzumab + Pertuzumab q21d     10/12/2022 -  Chemotherapy   Patient is on Treatment Plan : BREAST METASTATIC Fam-Trastuzumab Deruxtecan-nxki (Enhertu) (5.4) q21d     Malignant neoplasm metastatic to bone (HCC)  06/28/2021 Initial Diagnosis   Bone metastases (HCC)   09/22/2021 - 04/06/2022 Chemotherapy   Patient is on Treatment Plan : BREAST  Trastuzumab + Pertuzumab q21d      09/22/2021 - 09/21/2022 Chemotherapy   Patient is on Treatment Plan : BREAST Trastuzumab + Pertuzumab q21d     05/11/2022 Imaging   CT chest, abdomen and pelvis:  IMPRESSION:  1. Widespread bony metastatic disease shows increased sclerosis  since previous imaging. This is likely related to response to  therapy in the interval. There is a single lesion along the LEFT  iliac which shows continued lytic change  and warrants attention on  follow-up  2. Interval development of pathologic fractures at in the upper  thoracic spine and at the thoracolumbar junction with  associated  kyphosis.  3. No signs of solid organ or nodal metastatic disease.  4. Marked elevation of the LEFT hemidiaphragm as on the prior PET.  5. Aortic atherosclerosis.    10/12/2022 -  Chemotherapy   Patient is on Treatment Plan : BREAST METASTATIC Fam-Trastuzumab Deruxtecan-nxki (Enhertu) (5.4) q21d         REVIEW OF SYSTEMS:  Review of Systems  Constitutional: Negative.  Negative for appetite change, chills, diaphoresis, fatigue, fever and unexpected weight change.  HENT:  Negative.  Negative for hearing loss, lump/mass, mouth sores, nosebleeds, sore throat, tinnitus, trouble swallowing and voice change.   Eyes:  Positive for eye problems (blurry). Negative for icterus.  Respiratory: Negative.  Negative for chest tightness, cough, hemoptysis, shortness of breath (mild) and wheezing.   Cardiovascular: Negative.  Negative for chest pain, leg swelling and palpitations.  Gastrointestinal:  Positive for constipation and nausea (improved). Negative for abdominal distention, abdominal pain, blood in stool, diarrhea, rectal pain and vomiting.  Endocrine: Negative.   Genitourinary: Negative.  Negative for bladder incontinence, difficulty urinating, dyspareunia, dysuria, frequency, hematuria, menstrual problem, nocturia, pelvic pain, vaginal bleeding and vaginal discharge.   Musculoskeletal:  Positive for back pain (lower back pain, 4/10). Negative for arthralgias, flank pain, gait problem, myalgias, neck pain and neck stiffness.  Skin:  Negative for itching, rash and wound.  Neurological: Negative.  Negative for dizziness, extremity weakness, gait problem, headaches, light-headedness, numbness, seizures and speech difficulty.  Hematological: Negative.  Negative for adenopathy. Does not bruise/bleed easily.  Psychiatric/Behavioral: Negative.  Negative for confusion, decreased concentration, depression, sleep disturbance and suicidal ideas. The patient is not nervous/anxious.    VITALS:   Blood pressure 103/74, pulse 98, temperature 98.6 F (37 C), temperature source Oral, resp. rate 18, height 4\' 8"  (1.422 m), weight 134 lb 3.2 oz (60.9 kg), last menstrual period 08/18/2013, SpO2 98%.  Wt Readings from Last 3 Encounters:  02/15/23 134 lb (60.8 kg)  02/13/23 134 lb 3.2 oz (60.9 kg)  02/01/23 134 lb 0.6 oz (60.8 kg)    Body mass index is 30.09 kg/m.  Performance status (ECOG): 1 - Symptomatic but completely ambulatory  PHYSICAL EXAM:  Physical Exam Vitals and nursing note reviewed. Exam conducted with a chaperone present.  Constitutional:      General: She is not in acute distress.    Appearance: Normal appearance. She is normal weight. She is not ill-appearing, toxic-appearing or diaphoretic.  HENT:     Head: Normocephalic and atraumatic.     Right Ear: Tympanic membrane, ear canal and external ear normal. There is no impacted cerumen.     Left Ear: Tympanic membrane, ear canal and external ear normal. There is no impacted cerumen.     Nose: Nose normal. No congestion or rhinorrhea.     Mouth/Throat:     Mouth: Mucous membranes are moist.     Pharynx: Oropharynx is clear. No oropharyngeal exudate or posterior oropharyngeal erythema.  Eyes:     General: No scleral icterus.       Right eye: No discharge.        Left eye: No discharge.     Extraocular Movements: Extraocular movements intact.     Conjunctiva/sclera: Conjunctivae normal.     Pupils: Pupils are equal, round, and reactive to light.  Neck:     Vascular: No carotid  bruit.     Comments: Chronic flexion of the neck. Cardiovascular:     Rate and Rhythm: Normal rate and regular rhythm.     Pulses: Normal pulses.     Heart sounds: Normal heart sounds. No murmur heard.    No friction rub. No gallop.  Pulmonary:     Effort: Pulmonary effort is normal. No respiratory distress.     Breath sounds: Normal breath sounds. No stridor or decreased air movement. No wheezing, rhonchi or rales.  Chest:     Chest  wall: No tenderness.     Comments: Well healed scar in the upper outer of the left breast. Firmness diffusely in the left breast consistent with post radiation changes and the thickening of skin in both breast.  Abdominal:     General: Bowel sounds are normal. There is no distension.     Palpations: Abdomen is soft. There is no hepatomegaly, splenomegaly or mass.     Tenderness: There is no abdominal tenderness. There is no right CVA tenderness, left CVA tenderness, guarding or rebound.     Hernia: No hernia is present.  Musculoskeletal:        General: No swelling, tenderness, deformity or signs of injury. Normal range of motion.     Cervical back: Normal range of motion and neck supple. No rigidity or tenderness.     Right lower leg: No edema.     Left lower leg: No edema.     Comments: Her lower extremities are erythymatous and indurated with scaly dry skin. An ulcerastion is seen on the posterior right ankle wit slight purulence. She has a larger ulceration on the lateral left calf with some purulence.   Lymphadenopathy:     Cervical: No cervical adenopathy.     Right cervical: No superficial, deep or posterior cervical adenopathy.    Left cervical: No superficial, deep or posterior cervical adenopathy.     Upper Body:     Right upper body: No supraclavicular, axillary or pectoral adenopathy.     Left upper body: No supraclavicular, axillary or pectoral adenopathy.  Skin:    General: Skin is warm and dry.     Coloration: Skin is not jaundiced or pale.     Findings: No bruising, erythema, lesion or rash.     Comments: Bilateral lower extremities are red and swollen with induration, skin is dry and peeling but overall improved.   Neurological:     General: No focal deficit present.     Mental Status: She is alert and oriented to person, place, and time. Mental status is at baseline.     Cranial Nerves: No cranial nerve deficit.     Sensory: No sensory deficit.     Motor: No  weakness.     Coordination: Coordination normal.     Gait: Gait normal.     Deep Tendon Reflexes: Reflexes normal.  Psychiatric:        Mood and Affect: Mood normal.        Behavior: Behavior normal.        Thought Content: Thought content normal.        Judgment: Judgment normal.    LABORATORY DATA:  I have reviewed the data as listed  Component Ref Range & Units 2 wk ago (11/21/22) 2 mo ago (10/09/22) 3 mo ago (08/29/22) 5 mo ago (06/28/22) 1 yr ago (09/06/21) 1 yr ago (07/20/21)  CA 27.29 0.0 - 38.6 U/mL 46.2 High  53.8 High  CM 57.7 High  CM 50.9 High  CM 106.6 High  CM 75.6 High  C      Component Value Date/Time   NA 140 02/12/2023 1357   NA 137 02/01/2023 0000   K 4.1 02/12/2023 1357   CL 104 02/12/2023 1357   CO2 27 02/12/2023 1357   GLUCOSE 100 (H) 02/12/2023 1357   BUN 11 02/12/2023 1357   BUN 18 02/01/2023 0000   CREATININE 0.64 02/12/2023 1357   CALCIUM 8.6 (L) 02/12/2023 1357   PROT 6.0 (L) 02/12/2023 1357   ALBUMIN 3.3 (L) 02/12/2023 1357   AST 15 02/12/2023 1357   ALT 18 02/12/2023 1357   ALKPHOS 67 02/12/2023 1357   BILITOT 0.4 02/12/2023 1357   GFRNONAA >60 02/12/2023 1357   No results found for: "SPEP", "UPEP"  Lab Results  Component Value Date   WBC 3.8 (L) 02/12/2023   NEUTROABS 2.7 02/12/2023   HGB 11.1 (L) 02/12/2023   HCT 36.2 02/12/2023   MCV 98.9 02/12/2023   PLT 296 02/12/2023     Chemistry      Component Value Date/Time   NA 140 02/12/2023 1357   NA 137 02/01/2023 0000   K 4.1 02/12/2023 1357   CL 104 02/12/2023 1357   CO2 27 02/12/2023 1357   BUN 11 02/12/2023 1357   BUN 18 02/01/2023 0000   CREATININE 0.64 02/12/2023 1357   GLU 116 02/01/2023 0000      Component Value Date/Time   CALCIUM 8.6 (L) 02/12/2023 1357   ALKPHOS 67 02/12/2023 1357   AST 15 02/12/2023 1357   ALT 18 02/12/2023 1357   BILITOT 0.4 02/12/2023 1357     Component Ref Range & Units 1 d ago (02/12/23) 12 d ago (02/01/23) 3 wk ago (01/22/23) 1 mo  ago (01/14/23) 1 mo ago (01/09/23) 1 mo ago (01/03/23) 7 mo ago (07/18/22)  Magnesium 1.7 - 2.4 mg/dL 1.9 2.13 R 1.7 CM 2.0 CM 1.8 CM 1.6 Low  CM 2.0 CM    RADIOGRAPHIC STUDIES:   Echocardiogram: 10/18/2022 IMPRESSIONS:  1. TDS, decrease LVEF as compared to echo from 07/16/2022. Left  ventricular ejection fraction, by estimation, is 50 to 55%. The left  ventricle has low normal function. The left ventricle has no regional wall  motion abnormalities. Left ventricular  diastolic parameters are consistent with Grade I diastolic dysfunction  (impaired relaxation).   2. Right ventricular systolic function is normal. The right ventricular  size is normal. There is normal pulmonary artery systolic pressure.   3. The mitral valve is normal in structure. No evidence of mitral valve  regurgitation. No evidence of mitral stenosis.   4. The aortic valve is normal in structure. Aortic valve regurgitation is  not visualized. No aortic stenosis is present.   5. The inferior vena cava is normal in size with greater than 50%  respiratory variability, suggesting right atrial pressure of 3 mmHg.         I,Oluwatobi Asade,acting as a scribe for Dellia Beckwith, MD.,have documented all relevant documentation on the behalf of Dellia Beckwith, MD,as directed by  Dellia Beckwith, MD while in the presence of Dellia Beckwith, MD.

## 2023-02-15 ENCOUNTER — Inpatient Hospital Stay: Payer: BC Managed Care – PPO

## 2023-02-15 VITALS — BP 102/68 | HR 97 | Temp 99.0°F | Resp 22 | Wt 134.0 lb

## 2023-02-15 DIAGNOSIS — C7951 Secondary malignant neoplasm of bone: Secondary | ICD-10-CM

## 2023-02-15 DIAGNOSIS — C50412 Malignant neoplasm of upper-outer quadrant of left female breast: Secondary | ICD-10-CM

## 2023-02-15 DIAGNOSIS — Z5111 Encounter for antineoplastic chemotherapy: Secondary | ICD-10-CM | POA: Diagnosis not present

## 2023-02-15 MED ORDER — DEXTROSE 5 % IV SOLN: Freq: Once | INTRAVENOUS | Status: AC

## 2023-02-15 MED ORDER — SODIUM CHLORIDE 0.9% FLUSH
10.0000 mL | INTRAVENOUS | Status: DC | PRN
  Administered 2023-02-15: 10 mL

## 2023-02-15 MED ORDER — FAM-TRASTUZUMAB DERUXTECAN-NXKI CHEMO 100 MG IV SOLR
5.4000 mg/kg | Freq: Once | INTRAVENOUS | Status: AC
  Administered 2023-02-15: 340 mg via INTRAVENOUS
  Filled 2023-02-15: qty 17

## 2023-02-15 MED ORDER — DENOSUMAB 120 MG/1.7ML ~~LOC~~ SOLN
120.0000 mg | Freq: Once | SUBCUTANEOUS | Status: AC
  Administered 2023-02-15: 120 mg via SUBCUTANEOUS
  Filled 2023-02-15: qty 1.7

## 2023-02-15 MED ORDER — SODIUM CHLORIDE 0.9 % IV SOLN
150.0000 mg | Freq: Once | INTRAVENOUS | Status: AC
  Administered 2023-02-15: 150 mg via INTRAVENOUS
  Filled 2023-02-15: qty 150

## 2023-02-15 MED ORDER — PALONOSETRON HCL INJECTION 0.25 MG/5ML
0.2500 mg | Freq: Once | INTRAVENOUS | Status: AC
  Administered 2023-02-15: 0.25 mg via INTRAVENOUS
  Filled 2023-02-15: qty 5

## 2023-02-15 MED ORDER — ACETAMINOPHEN 325 MG PO TABS: 650.0000 mg | ORAL_TABLET | Freq: Once | ORAL | Status: DC

## 2023-02-15 MED ORDER — ALTEPLASE 2 MG IJ SOLR: 2.0000 mg | Freq: Once | INTRAMUSCULAR | Status: DC | PRN

## 2023-02-15 MED ORDER — DIPHENHYDRAMINE HCL 25 MG PO CAPS: 50.0000 mg | ORAL_CAPSULE | Freq: Once | ORAL | Status: DC

## 2023-02-15 MED ORDER — HEPARIN SOD (PORK) LOCK FLUSH 100 UNIT/ML IV SOLN
500.0000 [IU] | Freq: Once | INTRAVENOUS | Status: AC | PRN
  Administered 2023-02-15: 500 [IU]

## 2023-02-15 MED ORDER — SODIUM CHLORIDE 0.9 % IV SOLN
10.0000 mg | Freq: Once | INTRAVENOUS | Status: AC
  Administered 2023-02-15: 10 mg via INTRAVENOUS
  Filled 2023-02-15: qty 10

## 2023-02-15 NOTE — Patient Instructions (Signed)
Fam-Trastuzumab Deruxtecan Injection What is this medication? FAM-TRASTUZUMAB DERUXTECAN (fam-tras TOOZ eu mab DER ux TEE kan) treats some types of cancer. It works by blocking a protein that causes cancer cells to grow and multiply. This helps to slow or stop the spread of cancer cells. This medicine may be used for other purposes; ask your health care provider or pharmacist if you have questions. COMMON BRAND NAME(S): ENHERTU What should I tell my care team before I take this medication? They need to know if you have any of these conditions: Heart disease Heart failure Infection, especially a viral infection, such as chickenpox, cold sores, or herpes Liver disease Lung or breathing disease, such as asthma or COPD An unusual or allergic reaction to fam-trastuzumab deruxtecan, other medications, foods, dyes, or preservatives Pregnant or trying to get pregnant Breast-feeding How should I use this medication? This medication is injected into a vein. It is given by your care team in a hospital or clinic setting. A special MedGuide will be given to you before each treatment. Be sure to read this information carefully each time. Talk to your care team about the use of this medication in children. Special care may be needed. Overdosage: If you think you have taken too much of this medicine contact a poison control center or emergency room at once. NOTE: This medicine is only for you. Do not share this medicine with others. What if I miss a dose? It is important not to miss your dose. Call your care team if you are unable to keep an appointment. What may interact with this medication? Interactions are not expected. This list may not describe all possible interactions. Give your health care provider a list of all the medicines, herbs, non-prescription drugs, or dietary supplements you use. Also tell them if you smoke, drink alcohol, or use illegal drugs. Some items may interact with your  medicine. What should I watch for while using this medication? Visit your care team for regular checks on your progress. Tell your care team if your symptoms do not start to get better or if they get worse. Your condition will be monitored carefully while you are receiving this medication. Do not become pregnant while taking this medication or for 7 months after stopping it. Women should inform their care team if they wish to become pregnant or think they might be pregnant. Men should not father a child while taking this medication and for 4 months after stopping it. There is potential for serious side effects to an unborn child. Talk to your care team for more information. Do not breast-feed an infant while taking this medication or for 7 months after the last dose. This medication has caused decreased sperm counts in some men. This may make it more difficult to father a child. Talk to your care team if you are concerned about your fertility. This medication may increase your risk to bruise or bleed. Call your care team if you notice any unusual bleeding. Be careful brushing or flossing your teeth or using a toothpick because you may get an infection or bleed more easily. If you have any dental work done, tell your dentist you are receiving this medication. This medication may cause dry eyes and blurred vision. If you wear contact lenses, you may feel some discomfort. Lubricating eye drops may help. See your care team if the problem does not go away or is severe. This medication may increase your risk of getting an infection. Call your care team for   advice if you get a fever, chills, sore throat, or other symptoms of a cold or flu. Do not treat yourself. Try to avoid being around people who are sick. Avoid taking medications that contain aspirin, acetaminophen, ibuprofen, naproxen, or ketoprofen unless instructed by your care team. These medications may hide a fever. What side effects may I notice from  receiving this medication? Side effects that you should report to your care team as soon as possible: Allergic reactions--skin rash, itching, hives, swelling of the face, lips, tongue, or throat Dry cough, shortness of breath or trouble breathing Infection--fever, chills, cough, sore throat, wounds that don't heal, pain or trouble when passing urine, general feeling of discomfort or being unwell Heart failure--shortness of breath, swelling of the ankles, feet, or hands, sudden weight gain, unusual weakness or fatigue Unusual bruising or bleeding Side effects that usually do not require medical attention (report these to your care team if they continue or are bothersome): Constipation Diarrhea Hair loss Muscle pain Nausea Vomiting This list may not describe all possible side effects. Call your doctor for medical advice about side effects. You may report side effects to FDA at 1-800-FDA-1088. Where should I keep my medication? This medication is given in a hospital or clinic. It will not be stored at home. NOTE: This sheet is a summary. It may not cover all possible information. If you have questions about this medicine, talk to your doctor, pharmacist, or health care provider.  2024 [employer]/Gold Standard (2021-05-16 00:00:00)  

## 2023-02-20 ENCOUNTER — Telehealth: Payer: Self-pay

## 2023-02-20 NOTE — Telephone Encounter (Signed)
Pt's spouse called to ask if the referral to the wound care center could be made? He states, "the sores on her legs are worse than they were last week when Dr Gilman Buttner saw them".

## 2023-02-21 ENCOUNTER — Encounter: Payer: Self-pay | Admitting: Oncology

## 2023-02-22 ENCOUNTER — Telehealth: Payer: Self-pay

## 2023-02-22 NOTE — Telephone Encounter (Signed)
Patient called regarding wound care referral appt. Anna [name] voiced this was sent this morning. Wound center # [phone] given to patient to call Monday if she hasn't heard from them and to call us back with any further concerns.

## 2023-02-23 ENCOUNTER — Other Ambulatory Visit: Payer: Self-pay | Admitting: Cardiology

## 2023-03-01 ENCOUNTER — Inpatient Hospital Stay: Payer: BC Managed Care – PPO

## 2023-03-01 ENCOUNTER — Other Ambulatory Visit: Payer: Self-pay | Admitting: Hematology and Oncology

## 2023-03-01 VITALS — BP 97/69 | HR 105 | Temp 99.3°F | Resp 18 | Ht <= 58 in | Wt 132.2 lb

## 2023-03-01 DIAGNOSIS — C7951 Secondary malignant neoplasm of bone: Secondary | ICD-10-CM

## 2023-03-01 DIAGNOSIS — R0789 Other chest pain: Secondary | ICD-10-CM

## 2023-03-01 DIAGNOSIS — Z5111 Encounter for antineoplastic chemotherapy: Secondary | ICD-10-CM | POA: Diagnosis not present

## 2023-03-01 DIAGNOSIS — M545 Low back pain, unspecified: Secondary | ICD-10-CM

## 2023-03-01 LAB — CBC AND DIFFERENTIAL
HCT: 33 — AB (ref 36–46)
Hemoglobin: 11.2 — AB (ref 12.0–16.0)
Neutrophils Absolute: 5.33
Platelets: 368 10*3/uL (ref 150–400)
WBC: 6.5

## 2023-03-01 LAB — BASIC METABOLIC PANEL
BUN: 8 (ref 4–21)
CO2: 30 — AB (ref 13–22)
Chloride: 103 (ref 99–108)
Creatinine: 0.5 (ref 0.5–1.1)
Glucose: 165
Potassium: 2.9 mEq/L — AB (ref 3.5–5.1)
Sodium: 138 (ref 137–147)

## 2023-03-01 LAB — HEPATIC FUNCTION PANEL
ALT: 20 U/L (ref 7–35)
AST: 37 — AB (ref 13–35)
Alkaline Phosphatase: 78 (ref 25–125)
Bilirubin, Total: 0.4

## 2023-03-01 LAB — CBC: RBC: 3.64 — AB (ref 3.87–5.11)

## 2023-03-01 LAB — COMPREHENSIVE METABOLIC PANEL
Albumin: 3.2 — AB (ref 3.5–5.0)
Calcium: 8.6 — AB (ref 8.7–10.7)

## 2023-03-01 MED ORDER — FULVESTRANT 250 MG/5ML IM SOSY
500.0000 mg | PREFILLED_SYRINGE | Freq: Once | INTRAMUSCULAR | Status: AC
  Administered 2023-03-01: 500 mg via INTRAMUSCULAR

## 2023-03-01 MED ORDER — OXYCODONE HCL 10 MG PO TABS
10.0000 mg | ORAL_TABLET | ORAL | 0 refills | Status: DC | PRN
Start: 2023-03-01 — End: 2023-04-09

## 2023-03-01 NOTE — Patient Instructions (Signed)

## 2023-03-05 NOTE — Progress Notes (Signed)
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 She was on adjuvant hormonal therapy with tamoxifen 20 mg daily from October 2015 to May 2020, but due to elevation of the liver transaminases, was switched to anastrazole.   Malignant neoplasm metastatic to bone [facility]) New bone metastases, November 2022, with multiple marrow replacing lesions within the proximal right femur within the ischium as well as the inferior rami. 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 Ki67 was 60%. PET imaging from December 19th revealed dominant finding of intensely hypermetabolic aggressive skeletal metastasis involving the calvarium, sternum, thoracic and lumbar spine, and pelvis, with potential pathologic fracture of the right femur with internal fixation. There is soft tissue extension into the anterior mediastinum associated with the manubrial metastasis. Multiple spinal vertebral body lesions have cortical destruction along the posterior margin. She started monthly Xgeva in December, and completed radiation therapy. She was receiving THP but cycle 4 was delayed and dose reduction made by 20% due to left leg skin infection. She continued with Herceptin/Perjeta until she had progression of disease. We then switched her to [facility] chemotherapy, but she has had  significant toxicities of nausea, vomiting, diarrhea, and hypokalemia. These have been slowly improving and we will re scan her next time to assess her response. She was also placed on hormonal therapy with Faslodex injections. The current scans from July 22nd show improvement with stable lesions of the bone.   Increased Left Hip Pain In January 2024, she developed increased pain of the left hip. She was found to have innumerable metastatic lesions throughout the pelvis and proximal femur. There is a non displaced pathologic fracture through a large lesion in the superior left acetabulum. She was referred to an orthopedic oncologist at [facility] and they recommended radiation and no surgery. She has now received her palliative radiation. She followed-up with [facility] in April and they do not recommend surgery but will continue surveillance.    Hypokalemia This was severe due to intolerance to oral supplement and persistent nausea, vomiting, and diarrhea. She is supposed to be taking potassium at TID but is unable tolerate. Her potassium is down to 2.9 today. I prescribed IV potassium today and she will have another 30 meq IV potassium on Friday July 26th, when she has her chemotherapy.   Hypocalcemia Her calcium is better but this is with taking 6 tablets daily.  Her vitamin D level was adequate.  I recommended she increase her calcium supplement to 1 tablets TID.  I stressed the importance of her calcium supplement.   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  She was found to be iron deficient in September 2023 and was given IV iron supplement. Her hemoglobin had decreased from 12.1 to 10.8 and  back to 12.1 and now down to 11.1.   Abnormal Mammogram She does have thickening of the skin and trabecular thickening of the left breast. I find no  significant change on physical exam and feel that a lot of this can be post radiation change. In view of her widespread bone metastases, I will not pursue further evaluation but will continue periodic breast exams.  On CT scan this nodule of the left breast appears to be 12 mm, down from 13 mm in size   Large Cyst of the Right Kidney This arises from the lower pole and has been present for a long time, previously measuring 11 mm on last scan from September, 2023. During her CT simulation for her left hip radiation, the physicist noted that the cyst is enlarged and so we did re-evaluate.  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    Elevated left hemidiaphragm This is a new finding since the scan of 2020 and may be related to mediastinal involvement.  Is associated with atelectasis at the left base and explains why I heard decreased breath sounds in this area.   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  For now we will continue the Enhertu and Faslodex but keep this in mind for the future.   Nausea/Vomiting She was having this daily despite adding Emend and taking regular doses of Zofran, Compazine, and Ativan. I therefore added olanzapine and start at 5mg  at bedtime and she seems to be doing better. I have also added Dexamethazone 4mg  to take twice daily for 3 to 5 days after chemo. This is doing better now but I will increase the Olanzapine from 5 mg to 10 mg at bedtime.    Recurrent leg ulcers She had severe problems with this last year but it had finally healed with the assistance of the [facility].  Now she has recurrent ulceration of the left lateral calf and an early lesion of the right lower leg as well. They are trying Medihoney but she is going to the [facility] again.   Plan:   Her hemoglobin is 11.2, WBC is 6.5, and platelet  count is 368,000 today. Her CMP revealed a low potassium of 2.9 down from 4.1, and low total protein at 5.8 and I advised her to include more protein in her diet. She is supposed to be taking potassium at TID but is unable to tolerate. I prescribed IV potassium today and she will have another 30 meq IV potassium on Friday July 26th, when she has her chemotherapy..  She has had some episodes of vomiting and she says she takes some potassium gummies. . Her day 1 cycle 8 of Enhertu  is scheduled for  03/08/2023. She says her nausea started again when she started her antibiotics on Monday. She takes Olanzapine 5mg  at bedtime, and I increased it to 10 mg. She will receive her Faslodex and Xgeva injections monthly. She had some left leg sores the last time on the left leg and some on the right, and it got worse so we referred her to the wound center. Her legs are bandaged today. Her CT chest, abdomen and pelvis done on 03/01/2023 revealed unchanged mass in the lateral left breast measuring 1.3 x 1.1 cm and overlying skin thickening of the left breast, diminished soft tissue  thickening about the left subclavian structures and thoracic inlet, presumably reflecting treatment response of infiltrative soft tissue metastatic disease.There is unchanged, widespread mixed lytic and sclerotic osseous metastatic disease throughout, partially notable for pathologic wedge deformities of T12 and L1 status post vertebral cement augmentation, as well as untreated pathologic wedge deformities of T3 and T4, with multiple additional pathologic endplate deformities. No evidence of lymphadenopathy or organ metastatic disease in the chest, abdomen or pelvis. Unchanged, severe elevation of the left hemidiaphragm with associated scarring or atelectasis and coronary artery disease. I reviewed this result with her and her husband. I recommend that we continue with the present treatment. I will recheck her CBC and CMP in 1 week and  possible IV fluids if her potassium is still low and I will see her  back in 3 weeks with CBC and CMP for evaluation.  The patient understands the plans discussed today and is in agreement with them.  She knows to contact our office if she develops concerns prior to her next appointment.   I provided 30 minutes of face-to-face time during this encounter and > 50% was spent counseling as documented under my assessment and plan.     Dellia Beckwith, MD  [facility] [address] [place] [address] Dept: [phone] Dept Fax: [phone]   No orders of the defined types were placed in this encounter.    CHIEF COMPLAINT:  CC: A 52 year old female with breast cancer, metastatic to bone and nodes  Current Treatment:  Enhertu  INTERVAL HISTORY:  Trenae is here today for repeat clinical assessment for her breast cancer metastatic to bone.  Her [employer] results came back and revealed her to have a PIK3CA mutation. We will continue the Enhertu for now. She completed radiation at the left hip on 12/25/2022. Patient states that she feels well and says her chronic back pain is now a 4/10.  Her hemoglobin is 11.2, WBC is 6.5, and platelet count is 368,000 today. Her CMP revealed a low potassium of 2.9 down from 4.1, and low total protein at 5.8 and I advised her to include more protein in her diet. She is supposed to be taking potassium at TID but is unable to tolerate. I prescribed IV potassium today, 30 meq, and she will have another 30 meq IV potassium on Friday July 26th, when she has her chemotherapy..  She has had some episodes of vomiting and she says she takes some potassium gummies. . Her day 1 cycle 8 of Enhertu  is scheduled for  03/08/2023. She says her nausea started again when she started her antibiotics on Monday. She takes Olanzapine 5 mg at bedtime, and I increased it to 10 mg. She will receive her Faslodex  and Xgeva injections monthly. She had some left leg sores the last time on the left leg and some on the right, and it got worse, so we referred her to the wound center. Her legs are bandaged today. Her CT chest, abdomen and pelvis done on 03/01/2023 revealed an unchanged mass in the lateral left breast measuring 1.3 x 1.1 cm and overlying skin thickening of the left breast, diminished soft tissue thickening about the left subclavian structures and thoracic inlet, presumably reflecting treatment response of infiltrative soft tissue metastatic disease. There is unchanged, widespread mixed lytic and sclerotic osseous metastatic disease throughout, partially notable for pathologic wedge deformities of T12 and L1 status post vertebral cement augmentation, as  well as untreated pathologic wedge deformities of T3 and T4, with multiple additional pathologic endplate deformities. No evidence of lymphadenopathy or organ metastatic disease in the chest, abdomen or pelvis. Unchanged, severe elevation of the left hemidiaphragm with associated scarring or atelectasis and coronary artery disease. I reviewed this result with her and her husband. I recommend that we continue with the present treatment. I will recheck her CBC and CMP in 1 week with possible IV fluids if her potassium is still low, and I will see her  back in 3 weeks with CBC and CMP for evaluation. She  denies signs of infections such as sore throat, sinus drainage, cough or urinary symptoms. She  denies fever or recurrent chills. She  also deny nausea, vomiting, chest pain, or dyspnea. Her  appetite is good and Her  weight has decreased 1 pounds over last 3 weeks       I have reviewed the past medical history, past surgical history, social history and family history with the patient and they are unchanged from previous note.  ALLERGIES:  is allergic to reglan [metoclopramide].  MEDICATIONS:  Current Outpatient Medications  Medication Sig Dispense Refill    doxycycline (MONODOX) 100 MG capsule Take 100 mg by mouth 2 (two) times daily.     albuterol (VENTOLIN HFA) 108 (90 Base) MCG/ACT inhaler Inhale 2 puffs into the lungs every 6 (six) hours as needed for shortness of breath.     ARIPiprazole (ABILIFY) 5 MG tablet Take 2.5 mg by mouth daily.     aspirin 81 MG EC tablet Take 81 mg by mouth daily. Swallow whole.     atorvastatin (LIPITOR) 20 MG tablet TAKE ONE TABLET BY MOUTH EVERY DAY 90 tablet 0   augmented betamethasone dipropionate (DIPROLENE-AF) 0.05 % cream      calcium carbonate (TUMS - DOSED IN MG ELEMENTAL CALCIUM) 500 MG chewable tablet Chew 1 tablet by mouth daily. 4 gummies daily/ not tablet form .     CLENPIQ 10-3.5-12 MG-GM -GM/160ML SOLN      dapagliflozin propanediol (FARXIGA) 10 MG TABS tablet TAKE ONE TABLET BY MOUTH EVERY DAY 30 tablet 2   desvenlafaxine (PRISTIQ) 50 MG 24 hr tablet Take 50 mg by mouth daily.     dexamethasone (DECADRON) 4 MG tablet Take 1 tablet (4 mg total) by mouth 2 (two) times daily. For 5 days after chemotherapy 20 tablet 2   diphenoxylate-atropine (LOMOTIL) 2.5-0.025 MG tablet TAKE TWO TABLETS BY MOUTH FOUR TIMES A DAY AS NEEDED FOR DIARRHEA OR LOOSE STOOLS 100 tablet 1   furosemide (LASIX) 20 MG tablet Take 1 tablet (20 mg total) by mouth daily. (Patient taking differently: Take 20 mg by mouth as needed.) 30 tablet 0   gabapentin (NEURONTIN) 300 MG capsule Take 1 capsule (300 mg total) by mouth at bedtime. 30 capsule 1   labetalol (NORMODYNE) 200 MG tablet Take 1 tablet (200 mg total) by mouth 2 (two) times daily. 180 tablet 3   loratadine (CLARITIN) 10 MG tablet TAKE ONE TABLET BY MOUTH DAILY 30 tablet 2   LORazepam (ATIVAN) 1 MG tablet TAKE ONE TABLET BY MOUTH EVERY 8 HOURS 30 tablet 1   magic mouthwash (lidocaine, diphenhydrAMINE, alum & mag hydroxide) suspension Take 5 mLs by mouth every 3 (three) hours as needed. Swish and spit; for throat/mouth pain     naloxone (NARCAN) nasal spray 4 mg/0.1 mL One spray  in nostril as needed, may repeat every 2 to 3 minutes until medical assistance becomes available  4 each 1   nitroGLYCERIN (NITROSTAT) 0.4 MG SL tablet Place 0.4 mg under the tongue every 5 (five) minutes as needed for chest pain.     nystatin (MYCOSTATIN/NYSTOP) powder Apply 1 Application topically 2 (two) times daily.     OLANZapine (ZYPREXA) 10 MG tablet Take 1 tablet (10 mg total) by mouth at bedtime. 30 tablet 5   ondansetron (ZOFRAN) 8 MG tablet Take 1 tablet (8 mg total) by mouth every 8 (eight) hours as needed for nausea or vomiting. 60 tablet 5   Oxycodone HCl 10 MG TABS Take 1 tablet (10 mg total) by mouth every 4 (four) hours as needed. for pain 180 tablet 0   OZEMPIC, 0.25 OR 0.5 MG/DOSE, 2 MG/3ML SOPN Inject into the skin.     potassium chloride SA (KLOR-CON M) 20 MEQ tablet Take 1 tablet (20 mEq total) by mouth 2 (two) times daily. (Patient taking differently: Take 20 mEq by mouth 3 (three) times daily.) 60 tablet 0   prochlorperazine (COMPAZINE) 10 MG tablet TAKE ONE TABLET BY MOUTH EVERY 6 HOURS AS NEEDED FOR NAUSEA/VOMITING 90 tablet 3   Current Facility-Administered Medications  Medication Dose Route Frequency Provider Last Rate Last Admin   0.9 %  sodium chloride infusion  500 mL Intravenous Once Lynann Bologna, MD       technetium sestamibi generic (CARDIOLITE) injection 10.5 millicurie  10.5 millicurie Intravenous Once PRN Revankar, Aundra Dubin, MD        HISTORY OF PRESENT ILLNESS:   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 (HCC)   07/23/2013 Cancer Staging   Staging form: Breast, AJCC 7th Edition - Clinical stage from 07/23/2013: Stage IIB (T2, N1, M0) - Signed by Dellia Beckwith, MD on 07/04/2020 Prognostic indicators: Pos LVI   09/22/2021 - 04/06/2022 Chemotherapy   Patient is on Treatment Plan : BREAST  Trastuzumab +  Pertuzumab q21d      09/22/2021 - 09/21/2022 Chemotherapy   Patient is on Treatment Plan : BREAST Trastuzumab + Pertuzumab q21d     10/12/2022 -  Chemotherapy   Patient is on Treatment Plan : BREAST METASTATIC Fam-Trastuzumab Deruxtecan-nxki (Enhertu) (5.4) q21d     Malignant neoplasm metastatic to bone (HCC)  06/28/2021 Initial Diagnosis   Bone metastases (HCC)   09/22/2021 - 04/06/2022 Chemotherapy   Patient is on Treatment Plan : BREAST  Trastuzumab + Pertuzumab q21d      09/22/2021 - 09/21/2022 Chemotherapy   Patient is on Treatment Plan : BREAST Trastuzumab + Pertuzumab q21d     05/11/2022 Imaging   CT chest, abdomen and pelvis:  IMPRESSION:  1. Widespread bony metastatic disease shows increased sclerosis  since previous imaging. This is likely related to response to  therapy in the interval. There is a single lesion along the LEFT  iliac which shows continued lytic change and warrants attention on  follow-up  2. Interval development of pathologic fractures at in the upper  thoracic spine and at the thoracolumbar junction with associated  kyphosis.  3. No signs of solid organ or nodal metastatic disease.  4. Marked elevation of the LEFT hemidiaphragm as on the prior PET.  5. Aortic atherosclerosis.    10/12/2022 -  Chemotherapy   Patient is on Treatment Plan : BREAST METASTATIC Fam-Trastuzumab Deruxtecan-nxki (Enhertu) (5.4) q21d         REVIEW OF SYSTEMS:  Review of Systems  Constitutional: Negative.  Negative for appetite change, chills, diaphoresis, fatigue, fever and unexpected weight change.  HENT:  Negative.  Negative for hearing loss, lump/mass, mouth sores, nosebleeds, sore throat, tinnitus, trouble swallowing and voice change.   Eyes:  Positive for eye problems (blurry). Negative for icterus.  Respiratory: Negative.  Negative for chest tightness, cough, hemoptysis, shortness of breath and wheezing.   Cardiovascular: Negative.  Negative for chest pain, leg swelling and  palpitations.  Gastrointestinal:  Positive for constipation and nausea (improved). Negative for abdominal distention, abdominal pain, blood in stool, diarrhea, rectal pain and vomiting.  Endocrine: Negative.   Genitourinary: Negative.  Negative for bladder incontinence, difficulty urinating, dyspareunia, dysuria, frequency, hematuria, menstrual problem, nocturia, pelvic pain, vaginal bleeding and vaginal discharge.   Musculoskeletal:  Positive for back pain (lower back pain, 4/10). Negative for arthralgias, flank pain, gait problem, myalgias, neck pain and neck stiffness.  Skin:  Positive for wound (bilateral lower extremity wounds). Negative for itching and rash.  Neurological: Negative.  Negative for dizziness, extremity weakness, gait problem, headaches, light-headedness, numbness, seizures and speech difficulty.  Hematological: Negative.  Negative for adenopathy. Does not bruise/bleed easily.  Psychiatric/Behavioral: Negative.  Negative for confusion, decreased concentration, depression, sleep disturbance and suicidal ideas. The patient is not nervous/anxious.    VITALS:  Blood pressure 92/65, pulse 88, temperature 98.6 F (37 C), temperature source Oral, resp. rate 18, height 4\' 8"  (1.422 m), weight 133 lb 14.4 oz (60.7 kg), last menstrual period 08/18/2013, SpO2 97%.  Wt Readings from Last 3 Encounters:  03/15/23 129 lb 1.9 oz (58.6 kg)  03/08/23 130 lb (59 kg)  03/06/23 129 lb 0.6 oz (58.5 kg)    Body mass index is 30.02 kg/m.  Performance status (ECOG): 1 - Symptomatic but completely ambulatory  PHYSICAL EXAM:  Physical Exam Vitals and nursing note reviewed. Exam conducted with a chaperone present.  Constitutional:      General: She is not in acute distress.    Appearance: Normal appearance. She is normal weight. She is not ill-appearing, toxic-appearing or diaphoretic.  HENT:     Head: Normocephalic and atraumatic. Hair is abnormal (partial alopecia).     Right Ear: Tympanic  membrane, ear canal and external ear normal. There is no impacted cerumen.     Left Ear: Tympanic membrane, ear canal and external ear normal. There is no impacted cerumen.     Nose: Nose normal. No congestion or rhinorrhea.     Mouth/Throat:     Mouth: Mucous membranes are moist.     Pharynx: Oropharynx is clear. No oropharyngeal exudate or posterior oropharyngeal erythema.  Eyes:     General: No scleral icterus.       Right eye: No discharge.        Left eye: No discharge.     Extraocular Movements: Extraocular movements intact.     Conjunctiva/sclera: Conjunctivae normal.     Pupils: Pupils are equal, round, and reactive to light.  Neck:     Vascular: No carotid bruit.     Comments: Chronic flexion of the neck. Cardiovascular:     Rate and Rhythm: Normal rate and regular rhythm.     Pulses: Normal pulses.     Heart sounds: Normal heart sounds. No murmur heard.    No friction rub. No gallop.  Pulmonary:     Effort: Pulmonary effort is normal. No respiratory distress.     Breath sounds: Normal breath sounds. No stridor or decreased air movement. No wheezing, rhonchi or rales.  Chest:     Chest wall: No tenderness.     Comments: Well healed scar in the upper outer of the left breast. Firmness diffusely in the left breast consistent with post radiation changes and the thickening of skin in both breast.  Abdominal:     General: Bowel sounds are normal. There is no distension.     Palpations: Abdomen is soft. There is no hepatomegaly, splenomegaly or mass.     Tenderness: There is no abdominal tenderness. There is no right CVA tenderness, left CVA tenderness, guarding or rebound.     Hernia: No hernia is present.  Musculoskeletal:        General: No swelling, tenderness, deformity or signs of injury. Normal range of motion.     Cervical back: Normal range of motion and neck supple. No rigidity or tenderness.     Right lower leg: No edema.     Left lower leg: No edema.     Comments:  Her legs are bandaged today  Lymphadenopathy:     Cervical: No cervical adenopathy.     Right cervical: No superficial, deep or posterior cervical adenopathy.    Left cervical: No superficial, deep or posterior cervical adenopathy.     Upper Body:     Right upper body: No supraclavicular, axillary or pectoral adenopathy.     Left upper body: No supraclavicular, axillary or pectoral adenopathy.  Skin:    General: Skin is warm and dry.     Coloration: Skin is not jaundiced or pale.     Findings: No bruising, erythema, lesion or rash.  Neurological:     General: No focal deficit present.     Mental Status: She is alert and oriented to person, place, and time. Mental status is at baseline.     Cranial Nerves: No cranial nerve deficit.     Sensory: No sensory deficit.     Motor: No weakness.     Coordination: Coordination normal.     Gait: Gait normal.     Deep Tendon Reflexes: Reflexes normal.  Psychiatric:        Mood and Affect: Mood normal.        Behavior: Behavior normal.        Thought Content: Thought content normal.        Judgment: Judgment normal.    LABORATORY DATA:  I have reviewed the data as listed      Component Value Date/Time   NA 138 03/15/2023 0000   K 3.8 03/15/2023 0000   CL 102 03/15/2023 0000   CO2 24 (A) 03/15/2023 0000   GLUCOSE 100 (H) 02/12/2023 1357   BUN 13 03/15/2023 0000   CREATININE 0.5 03/15/2023 0000   CREATININE 0.64 02/12/2023 1357   CALCIUM 8.9 03/15/2023 0000   PROT 6.0 (L) 02/12/2023 1357   ALBUMIN 3.6 03/15/2023 0000   AST 24 03/15/2023 0000   AST 15 02/12/2023 1357   ALT 17 03/15/2023 0000   ALT 18 02/12/2023 1357   ALKPHOS 70 03/15/2023 0000   BILITOT 0.4 02/12/2023 1357   GFRNONAA >60 02/12/2023 1357   No results found for: "SPEP", "UPEP"  Lab Results  Component Value Date   WBC 8.6 03/15/2023   NEUTROABS 7.40 03/15/2023   HGB 11.6 (A) 03/15/2023   HCT 36 03/15/2023   MCV 98.9 02/12/2023   PLT 310 03/15/2023      Chemistry      Component Value Date/Time   NA 138 03/15/2023 0000   K  3.8 03/15/2023 0000   CL 102 03/15/2023 0000   CO2 24 (A) 03/15/2023 0000   BUN 13 03/15/2023 0000   CREATININE 0.5 03/15/2023 0000   CREATININE 0.64 02/12/2023 1357   GLU 180 03/15/2023 0000      Component Value Date/Time   CALCIUM 8.9 03/15/2023 0000   ALKPHOS 70 03/15/2023 0000   AST 24 03/15/2023 0000   AST 15 02/12/2023 1357   ALT 17 03/15/2023 0000   ALT 18 02/12/2023 1357   BILITOT 0.4 02/12/2023 1357     Component Ref Range & Units 2 wk ago (11/21/22) 2 mo ago (10/09/22) 3 mo ago (08/29/22) 5 mo ago (06/28/22) 1 yr ago (09/06/21) 1 yr ago (07/20/21)  CA 27.29 0.0 - 38.6 U/mL 46.2 High  53.8 High  CM 57.7 High  CM 50.9 High  CM 106.6 High  CM 75.6 High  C   Component Ref Range & Units 3 wk ago (02/12/23) 1 mo ago (02/01/23) 1 mo ago (01/22/23) 1 mo ago (01/14/23) 1 mo ago (01/09/23) 2 mo ago (01/03/23) 7 mo ago (07/18/22)  Magnesium 1.7 - 2.4 mg/dL 1.9 1.61 R 1.7 CM 2.0 CM 1.8 CM 1.6 Low  CM 2.0 CM    RADIOGRAPHIC STUDIES: EXAM: 03/01/2023 CT CHEST, ABDOMEN AND PELVIS IMPRESSION  Unchanged mass in the lateral left breast measuring 1.3 x 1.1 cm and overlying skin thickening of the left breast  Diminished soft tissue thickening about the left subclavian structures and thoracic inlet, presumably reflecting treatment response of infiltrative soft tissue metastatic disease.  Unchanged, widespread mixed lytic and sclerotic osseous metastatic disease throughout, partially notable for pathologic wedge deformities of T12 and l1 status post vertebral cement augmentation, as well as untreated pathologic wedge deformities of T3 and T4, with multiple additional pathologic endplate deformities.  No evidence of lymphadenopathy or organ metastatic disease in the chest, abdomen or pelvis.  Unchanged, severe elevation of the left hemidiaphragm with associated scarring or atelectasis  Coronary artery  disease.     Echocardiogram: 10/18/2022 IMPRESSIONS:  1. TDS, decrease LVEF as compared to echo from 07/16/2022. Left  ventricular ejection fraction, by estimation, is 50 to 55%. The left  ventricle has low normal function. The left ventricle has no regional wall  motion abnormalities. Left ventricular  diastolic parameters are consistent with Grade I diastolic dysfunction  (impaired relaxation).   2. Right ventricular systolic function is normal. The right ventricular  size is normal. There is normal pulmonary artery systolic pressure.   3. The mitral valve is normal in structure. No evidence of mitral valve  regurgitation. No evidence of mitral stenosis.   4. The aortic valve is normal in structure. Aortic valve regurgitation is  not visualized. No aortic stenosis is present.   5. The inferior vena cava is normal in size with greater than 50%  respiratory variability, suggesting right atrial pressure of 3 mmHg.         I,Oluwatobi Asade,acting as a scribe for Dellia Beckwith, MD.,have documented all relevant documentation on the behalf of Dellia Beckwith, MD,as directed by  Dellia Beckwith, MD while in the presence of Dellia Beckwith, MD.

## 2023-03-06 ENCOUNTER — Encounter: Payer: Self-pay | Admitting: Oncology

## 2023-03-06 ENCOUNTER — Inpatient Hospital Stay (INDEPENDENT_AMBULATORY_CARE_PROVIDER_SITE_OTHER): Payer: BC Managed Care – PPO | Admitting: Oncology

## 2023-03-06 ENCOUNTER — Other Ambulatory Visit: Payer: Self-pay | Admitting: Oncology

## 2023-03-06 ENCOUNTER — Inpatient Hospital Stay: Payer: BC Managed Care – PPO

## 2023-03-06 VITALS — BP 99/59 | HR 103 | Temp 98.6°F | Resp 18 | Ht <= 58 in | Wt 129.0 lb

## 2023-03-06 VITALS — BP 92/65 | HR 88 | Temp 98.6°F | Resp 18 | Ht <= 58 in | Wt 133.9 lb

## 2023-03-06 DIAGNOSIS — C7951 Secondary malignant neoplasm of bone: Secondary | ICD-10-CM | POA: Diagnosis not present

## 2023-03-06 DIAGNOSIS — Z5111 Encounter for antineoplastic chemotherapy: Secondary | ICD-10-CM | POA: Diagnosis not present

## 2023-03-06 DIAGNOSIS — Z170 Estrogen receptor positive status [ER+]: Secondary | ICD-10-CM

## 2023-03-06 DIAGNOSIS — C50412 Malignant neoplasm of upper-outer quadrant of left female breast: Secondary | ICD-10-CM | POA: Diagnosis not present

## 2023-03-06 MED ORDER — POTASSIUM CHLORIDE 10 MEQ/100ML IV SOLN
10.0000 meq | Freq: Once | INTRAVENOUS | Status: AC
  Administered 2023-03-06: 10 meq via INTRAVENOUS
  Filled 2023-03-06: qty 100

## 2023-03-06 MED ORDER — HEPARIN SOD (PORK) LOCK FLUSH 100 UNIT/ML IV SOLN
500.0000 [IU] | Freq: Once | INTRAVENOUS | Status: AC | PRN
  Administered 2023-03-06: 500 [IU]

## 2023-03-06 MED ORDER — POTASSIUM CHLORIDE 10 MEQ/100ML IV SOLN
10.0000 meq | INTRAVENOUS | Status: AC
  Administered 2023-03-06 (×3): 10 meq via INTRAVENOUS
  Filled 2023-03-06 (×3): qty 100

## 2023-03-06 MED ORDER — SODIUM CHLORIDE 0.9% FLUSH
10.0000 mL | INTRAVENOUS | Status: DC | PRN
  Administered 2023-03-06: 10 mL

## 2023-03-06 MED ORDER — SODIUM CHLORIDE 0.9 % IV SOLN: Freq: Once | INTRAVENOUS | Status: AC

## 2023-03-06 MED ORDER — OLANZAPINE 10 MG PO TABS
10.0000 mg | ORAL_TABLET | Freq: Every day | ORAL | 5 refills | Status: DC
Start: 2023-03-06 — End: 2023-04-16

## 2023-03-06 NOTE — Progress Notes (Signed)
[facility] to face contact with [facility] and [facility]. Pt reports that she is feeling "pretty good ' today. Pt reports that after a treatment she feels bad for around 7 days but recovers prior ro the next treatment.

## 2023-03-07 MED FILL — Fosaprepitant Dimeglumine For IV Infusion 150 MG (Base Eq): INTRAVENOUS | Qty: 5 | Status: AC

## 2023-03-07 MED FILL — Dexamethasone Sodium Phosphate Inj 100 MG/10ML: INTRAMUSCULAR | Qty: 1 | Status: AC

## 2023-03-08 ENCOUNTER — Inpatient Hospital Stay: Payer: BC Managed Care – PPO

## 2023-03-08 VITALS — BP 99/55 | HR 95 | Temp 98.2°F | Resp 18 | Ht <= 58 in | Wt 130.0 lb

## 2023-03-08 DIAGNOSIS — Z5111 Encounter for antineoplastic chemotherapy: Secondary | ICD-10-CM | POA: Diagnosis not present

## 2023-03-08 DIAGNOSIS — C50412 Malignant neoplasm of upper-outer quadrant of left female breast: Secondary | ICD-10-CM

## 2023-03-08 DIAGNOSIS — C7951 Secondary malignant neoplasm of bone: Secondary | ICD-10-CM

## 2023-03-08 MED ORDER — FAM-TRASTUZUMAB DERUXTECAN-NXKI CHEMO 100 MG IV SOLR
5.4000 mg/kg | Freq: Once | INTRAVENOUS | Status: AC
  Administered 2023-03-08: 340 mg via INTRAVENOUS
  Filled 2023-03-08: qty 17

## 2023-03-08 MED ORDER — ACETAMINOPHEN 325 MG PO TABS
650.0000 mg | ORAL_TABLET | Freq: Once | ORAL | Status: DC
  Filled 2023-03-08: qty 2

## 2023-03-08 MED ORDER — SODIUM CHLORIDE 0.9 % IV SOLN
10.0000 mg | Freq: Once | INTRAVENOUS | Status: AC
  Administered 2023-03-08: 10 mg via INTRAVENOUS
  Filled 2023-03-08: qty 10

## 2023-03-08 MED ORDER — DIPHENHYDRAMINE HCL 25 MG PO CAPS
50.0000 mg | ORAL_CAPSULE | Freq: Once | ORAL | Status: DC
  Filled 2023-03-08: qty 2

## 2023-03-08 MED ORDER — HEPARIN SOD (PORK) LOCK FLUSH 100 UNIT/ML IV SOLN
500.0000 [IU] | Freq: Once | INTRAVENOUS | Status: AC | PRN
  Administered 2023-03-08: 500 [IU]

## 2023-03-08 MED ORDER — DEXTROSE 5 % IV SOLN: Freq: Once | INTRAVENOUS | Status: AC

## 2023-03-08 MED ORDER — POTASSIUM CHLORIDE 10 MEQ/100ML IV SOLN
10.0000 meq | INTRAVENOUS | Status: AC
  Administered 2023-03-08 (×2): 10 meq via INTRAVENOUS
  Filled 2023-03-08 (×2): qty 100

## 2023-03-08 MED ORDER — PALONOSETRON HCL INJECTION 0.25 MG/5ML
0.2500 mg | Freq: Once | INTRAVENOUS | Status: AC
  Administered 2023-03-08: 0.25 mg via INTRAVENOUS
  Filled 2023-03-08: qty 5

## 2023-03-08 MED ORDER — SODIUM CHLORIDE 0.9 % IV SOLN
150.0000 mg | Freq: Once | INTRAVENOUS | Status: AC
  Administered 2023-03-08: 150 mg via INTRAVENOUS
  Filled 2023-03-08: qty 150

## 2023-03-08 MED ORDER — SODIUM CHLORIDE 0.9% FLUSH
10.0000 mL | INTRAVENOUS | Status: DC | PRN
  Administered 2023-03-08: 10 mL

## 2023-03-08 NOTE — Patient Instructions (Signed)
Fam-Trastuzumab Deruxtecan Injection What is this medication? FAM-TRASTUZUMAB DERUXTECAN (fam-tras TOOZ eu mab DER ux TEE kan) treats some types of cancer. It works by blocking a protein that causes cancer cells to grow and multiply. This helps to slow or stop the spread of cancer cells. This medicine may be used for other purposes; ask your health care provider or pharmacist if you have questions. COMMON BRAND NAME(S): ENHERTU What should I tell my care team before I take this medication? They need to know if you have any of these conditions: Heart disease Heart failure Infection, especially a viral infection, such as chickenpox, cold sores, or herpes Liver disease Lung or breathing disease, such as asthma or COPD An unusual or allergic reaction to fam-trastuzumab deruxtecan, other medications, foods, dyes, or preservatives Pregnant or trying to get pregnant Breast-feeding How should I use this medication? This medication is injected into a vein. It is given by your care team in a hospital or clinic setting. A special MedGuide will be given to you before each treatment. Be sure to read this information carefully each time. Talk to your care team about the use of this medication in children. Special care may be needed. Overdosage: If you think you have taken too much of this medicine contact a poison control center or emergency room at once. NOTE: This medicine is only for you. Do not share this medicine with others. What if I miss a dose? It is important not to miss your dose. Call your care team if you are unable to keep an appointment. What may interact with this medication? Interactions are not expected. This list may not describe all possible interactions. Give your health care provider a list of all the medicines, herbs, non-prescription drugs, or dietary supplements you use. Also tell them if you smoke, drink alcohol, or use illegal drugs. Some items may interact with your  medicine. What should I watch for while using this medication? Visit your care team for regular checks on your progress. Tell your care team if your symptoms do not start to get better or if they get worse. Your condition will be monitored carefully while you are receiving this medication. Do not become pregnant while taking this medication or for 7 months after stopping it. Women should inform their care team if they wish to become pregnant or think they might be pregnant. Men should not father a child while taking this medication and for 4 months after stopping it. There is potential for serious side effects to an unborn child. Talk to your care team for more information. Do not breast-feed an infant while taking this medication or for 7 months after the last dose. This medication has caused decreased sperm counts in some men. This may make it more difficult to father a child. Talk to your care team if you are concerned about your fertility. This medication may increase your risk to bruise or bleed. Call your care team if you notice any unusual bleeding. Be careful brushing or flossing your teeth or using a toothpick because you may get an infection or bleed more easily. If you have any dental work done, tell your dentist you are receiving this medication. This medication may cause dry eyes and blurred vision. If you wear contact lenses, you may feel some discomfort. Lubricating eye drops may help. See your care team if the problem does not go away or is severe. This medication may increase your risk of getting an infection. Call your care team for   advice if you get a fever, chills, sore throat, or other symptoms of a cold or flu. Do not treat yourself. Try to avoid being around people who are sick. Avoid taking medications that contain aspirin, acetaminophen, ibuprofen, naproxen, or ketoprofen unless instructed by your care team. These medications may hide a fever. What side effects may I notice from  receiving this medication? Side effects that you should report to your care team as soon as possible: Allergic reactions--skin rash, itching, hives, swelling of the face, lips, tongue, or throat Dry cough, shortness of breath or trouble breathing Infection--fever, chills, cough, sore throat, wounds that don't heal, pain or trouble when passing urine, general feeling of discomfort or being unwell Heart failure--shortness of breath, swelling of the ankles, feet, or hands, sudden weight gain, unusual weakness or fatigue Unusual bruising or bleeding Side effects that usually do not require medical attention (report these to your care team if they continue or are bothersome): Constipation Diarrhea Hair loss Muscle pain Nausea Vomiting This list may not describe all possible side effects. Call your doctor for medical advice about side effects. You may report side effects to FDA at 1-800-FDA-1088. Where should I keep my medication? This medication is given in a hospital or clinic. It will not be stored at home. NOTE: This sheet is a summary. It may not cover all possible information. If you have questions about this medicine, talk to your doctor, pharmacist, or health care provider.  2024 [employer]/Gold Standard (2021-05-16 00:00:00)  

## 2023-03-15 ENCOUNTER — Inpatient Hospital Stay: Payer: BC Managed Care – PPO

## 2023-03-15 ENCOUNTER — Inpatient Hospital Stay: Payer: BC Managed Care – PPO | Attending: Hematology and Oncology

## 2023-03-15 VITALS — BP 108/65 | HR 104 | Temp 98.8°F | Resp 12 | Ht <= 58 in | Wt 129.1 lb

## 2023-03-15 DIAGNOSIS — E876 Hypokalemia: Secondary | ICD-10-CM | POA: Diagnosis not present

## 2023-03-15 DIAGNOSIS — Z170 Estrogen receptor positive status [ER+]: Secondary | ICD-10-CM | POA: Insufficient documentation

## 2023-03-15 DIAGNOSIS — Z5111 Encounter for antineoplastic chemotherapy: Secondary | ICD-10-CM | POA: Diagnosis not present

## 2023-03-15 DIAGNOSIS — D630 Anemia in neoplastic disease: Secondary | ICD-10-CM | POA: Insufficient documentation

## 2023-03-15 DIAGNOSIS — Z5112 Encounter for antineoplastic immunotherapy: Secondary | ICD-10-CM | POA: Diagnosis not present

## 2023-03-15 DIAGNOSIS — C7951 Secondary malignant neoplasm of bone: Secondary | ICD-10-CM | POA: Insufficient documentation

## 2023-03-15 DIAGNOSIS — K5900 Constipation, unspecified: Secondary | ICD-10-CM | POA: Insufficient documentation

## 2023-03-15 DIAGNOSIS — R300 Dysuria: Secondary | ICD-10-CM | POA: Diagnosis not present

## 2023-03-15 DIAGNOSIS — C50412 Malignant neoplasm of upper-outer quadrant of left female breast: Secondary | ICD-10-CM | POA: Diagnosis not present

## 2023-03-15 DIAGNOSIS — R112 Nausea with vomiting, unspecified: Secondary | ICD-10-CM | POA: Diagnosis not present

## 2023-03-15 LAB — BASIC METABOLIC PANEL
BUN: 13 (ref 4–21)
CO2: 24 — AB (ref 13–22)
Chloride: 102 (ref 99–108)
Creatinine: 0.5 (ref 0.5–1.1)
Glucose: 180
Potassium: 3.8 mEq/L (ref 3.5–5.1)
Sodium: 138 (ref 137–147)

## 2023-03-15 LAB — CBC AND DIFFERENTIAL
HCT: 36 (ref 36–46)
Hemoglobin: 11.6 — AB (ref 12.0–16.0)
Neutrophils Absolute: 7.4
Platelets: 310 10*3/uL (ref 150–400)
WBC: 8.6

## 2023-03-15 LAB — HEPATIC FUNCTION PANEL
ALT: 17 U/L (ref 7–35)
AST: 24 (ref 13–35)
Alkaline Phosphatase: 70 (ref 25–125)
Bilirubin, Total: 0.6

## 2023-03-15 LAB — COMPREHENSIVE METABOLIC PANEL
Albumin: 3.6 (ref 3.5–5.0)
Calcium: 8.9 (ref 8.7–10.7)

## 2023-03-15 LAB — CBC: RBC: 3.91 (ref 3.87–5.11)

## 2023-03-15 MED ORDER — DENOSUMAB 120 MG/1.7ML ~~LOC~~ SOLN
120.0000 mg | Freq: Once | SUBCUTANEOUS | Status: AC
  Administered 2023-03-15: 120 mg via SUBCUTANEOUS
  Filled 2023-03-15: qty 1.7

## 2023-03-15 NOTE — Patient Instructions (Signed)
Denosumab Injection (Oncology) What is this medication? DENOSUMAB (den oh SUE mab) prevents weakened bones caused by cancer. It may also be used to treat noncancerous bone tumors that cannot be removed by surgery. It can also be used to treat high calcium levels in the blood caused by cancer. It works by blocking a protein that causes bones to break down quickly. This slows down the release of calcium from bones, which lowers calcium levels in your blood. It also makes your bones stronger and less likely to break (fracture). This medicine may be used for other purposes; ask your health care provider or pharmacist if you have questions. COMMON BRAND NAME(S): XGEVA What should I tell my care team before I take this medication? They need to know if you have any of these conditions: Dental disease Having surgery or tooth extraction Infection Kidney disease Low levels of calcium or vitamin D in the blood Malnutrition On hemodialysis Skin conditions or sensitivity Thyroid or parathyroid disease An unusual reaction to denosumab, other medications, foods, dyes, or preservatives Pregnant or trying to get pregnant Breast-feeding How should I use this medication? This medication is for injection under the skin. It is given by your care team in a hospital or clinic setting. A special MedGuide will be given to you before each treatment. Be sure to read this information carefully each time. Talk to your care team about the use of this medication in children. While it may be prescribed for children as young as 13 years for selected conditions, precautions do apply. Overdosage: If you think you have taken too much of this medicine contact a poison control center or emergency room at once. NOTE: This medicine is only for you. Do not share this medicine with others. What if I miss a dose? Keep appointments for follow-up doses. It is important not to miss your dose. Call your care team if you are unable to  keep an appointment. What may interact with this medication? Do not take this medication with any of the following: Other medications containing denosumab This medication may also interact with the following: Medications that lower your chance of fighting infection Steroid medications, such as prednisone or cortisone This list may not describe all possible interactions. Give your health care provider a list of all the medicines, herbs, non-prescription drugs, or dietary supplements you use. Also tell them if you smoke, drink alcohol, or use illegal drugs. Some items may interact with your medicine. What should I watch for while using this medication? Your condition will be monitored carefully while you are receiving this medication. You may need blood work while taking this medication. This medication may increase your risk of getting an infection. Call your care team for advice if you get a fever, chills, sore throat, or other symptoms of a cold or flu. Do not treat yourself. Try to avoid being around people who are sick. You should make sure you get enough calcium and vitamin D while you are taking this medication, unless your care team tells you not to. Discuss the foods you eat and the vitamins you take with your care team. Some people who take this medication have severe bone, joint, or muscle pain. This medication may also increase your risk for jaw problems or a broken thigh bone. Tell your care team right away if you have severe pain in your jaw, bones, joints, or muscles. Tell your care team if you have any pain that does not go away or that gets worse. Talk   to your care team if you may be pregnant. Serious birth defects can occur if you take this medication during pregnancy and for 5 months after the last dose. You will need a negative pregnancy test before starting this medication. Contraception is recommended while taking this medication and for 5 months after the last dose. Your care team  can help you find the option that works for you. What side effects may I notice from receiving this medication? Side effects that you should report to your care team as soon as possible: Allergic reactions--skin rash, itching, hives, swelling of the face, lips, tongue, or throat Bone, joint, or muscle pain Low calcium level--muscle pain or cramps, confusion, tingling, or numbness in the hands or feet Osteonecrosis of the jaw--pain, swelling, or redness in the mouth, numbness of the jaw, poor healing after dental work, unusual discharge from the mouth, visible bones in the mouth Side effects that usually do not require medical attention (report to your care team if they continue or are bothersome): Cough Diarrhea Fatigue Headache Nausea This list may not describe all possible side effects. Call your doctor for medical advice about side effects. You may report side effects to FDA at 1-800-FDA-1088. Where should I keep my medication? This medication is given in a hospital or clinic. It will not be stored at home. NOTE: This sheet is a summary. It may not cover all possible information. If you have questions about this medicine, talk to your doctor, pharmacist, or health care provider.  2024 Elsevier/Gold Standard (2021-12-20 00:00:00)  

## 2023-03-20 ENCOUNTER — Encounter: Payer: Self-pay | Admitting: Oncology

## 2023-03-25 ENCOUNTER — Inpatient Hospital Stay: Payer: BC Managed Care – PPO

## 2023-03-25 DIAGNOSIS — C7951 Secondary malignant neoplasm of bone: Secondary | ICD-10-CM

## 2023-03-25 DIAGNOSIS — Z5111 Encounter for antineoplastic chemotherapy: Secondary | ICD-10-CM | POA: Diagnosis not present

## 2023-03-25 DIAGNOSIS — Z170 Estrogen receptor positive status [ER+]: Secondary | ICD-10-CM

## 2023-03-25 LAB — CBC WITH DIFFERENTIAL (CANCER CENTER ONLY)
Abs Immature Granulocytes: 0.02 10*3/uL (ref 0.00–0.07)
Basophils Absolute: 0 10*3/uL (ref 0.0–0.1)
Basophils Relative: 1 %
Eosinophils Absolute: 0.2 10*3/uL (ref 0.0–0.5)
Eosinophils Relative: 4 %
HCT: 35.8 % — ABNORMAL LOW (ref 36.0–46.0)
Hemoglobin: 11.1 g/dL — ABNORMAL LOW (ref 12.0–15.0)
Immature Granulocytes: 0 %
Lymphocytes Relative: 8 %
Lymphs Abs: 0.5 10*3/uL — ABNORMAL LOW (ref 0.7–4.0)
MCH: 30.1 pg (ref 26.0–34.0)
MCHC: 31 g/dL (ref 30.0–36.0)
MCV: 97 fL (ref 80.0–100.0)
Monocytes Absolute: 0.4 10*3/uL (ref 0.1–1.0)
Monocytes Relative: 7 %
Neutro Abs: 4.6 10*3/uL (ref 1.7–7.7)
Neutrophils Relative %: 80 %
Platelet Count: 315 10*3/uL (ref 150–400)
RBC: 3.69 MIL/uL — ABNORMAL LOW (ref 3.87–5.11)
RDW: 16.6 % — ABNORMAL HIGH (ref 11.5–15.5)
WBC Count: 5.7 10*3/uL (ref 4.0–10.5)
nRBC: 0 % (ref 0.0–0.2)

## 2023-03-25 LAB — COMPREHENSIVE METABOLIC PANEL
ALT: 14 U/L (ref 0–44)
AST: 17 U/L (ref 15–41)
Albumin: 3.2 g/dL — ABNORMAL LOW (ref 3.5–5.0)
Alkaline Phosphatase: 55 U/L (ref 38–126)
Anion gap: 12 (ref 5–15)
BUN: 17 mg/dL (ref 6–20)
CO2: 30 mmol/L (ref 22–32)
Calcium: 9.5 mg/dL (ref 8.9–10.3)
Chloride: 101 mmol/L (ref 98–111)
Creatinine, Ser: 0.46 mg/dL (ref 0.44–1.00)
GFR, Estimated: >60 mL/min (ref >60)
Glucose, Bld: 133 mg/dL — ABNORMAL HIGH (ref 70–99)
Potassium: 2.7 mmol/L — CL (ref 3.5–5.1)
Sodium: 143 mmol/L (ref 135–145)
Total Bilirubin: 0.2 mg/dL — ABNORMAL LOW (ref 0.3–1.2)
Total Protein: 5.8 g/dL — ABNORMAL LOW (ref 6.5–8.1)

## 2023-03-25 LAB — MAGNESIUM: Magnesium: 1.6 mg/dL — ABNORMAL LOW (ref 1.7–2.4)

## 2023-03-26 ENCOUNTER — Other Ambulatory Visit: Payer: BC Managed Care – PPO

## 2023-03-26 ENCOUNTER — Inpatient Hospital Stay (INDEPENDENT_AMBULATORY_CARE_PROVIDER_SITE_OTHER): Payer: BC Managed Care – PPO | Admitting: Hematology and Oncology

## 2023-03-26 ENCOUNTER — Other Ambulatory Visit: Payer: Self-pay

## 2023-03-26 ENCOUNTER — Encounter: Payer: Self-pay | Admitting: Oncology

## 2023-03-26 ENCOUNTER — Inpatient Hospital Stay: Payer: BC Managed Care – PPO

## 2023-03-26 ENCOUNTER — Encounter: Payer: Self-pay | Admitting: Hematology and Oncology

## 2023-03-26 VITALS — BP 103/72 | HR 99 | Temp 98.9°F | Resp 18 | Ht <= 58 in | Wt 131.8 lb

## 2023-03-26 VITALS — BP 104/74 | HR 108 | Temp 99.3°F | Resp 20 | Ht <= 58 in | Wt 131.0 lb

## 2023-03-26 DIAGNOSIS — Z5111 Encounter for antineoplastic chemotherapy: Secondary | ICD-10-CM | POA: Diagnosis not present

## 2023-03-26 DIAGNOSIS — R112 Nausea with vomiting, unspecified: Secondary | ICD-10-CM | POA: Insufficient documentation

## 2023-03-26 DIAGNOSIS — K5903 Drug induced constipation: Secondary | ICD-10-CM

## 2023-03-26 DIAGNOSIS — R300 Dysuria: Secondary | ICD-10-CM | POA: Diagnosis not present

## 2023-03-26 DIAGNOSIS — E876 Hypokalemia: Secondary | ICD-10-CM | POA: Diagnosis not present

## 2023-03-26 DIAGNOSIS — C50412 Malignant neoplasm of upper-outer quadrant of left female breast: Secondary | ICD-10-CM

## 2023-03-26 DIAGNOSIS — C7951 Secondary malignant neoplasm of bone: Secondary | ICD-10-CM | POA: Diagnosis not present

## 2023-03-26 DIAGNOSIS — D630 Anemia in neoplastic disease: Secondary | ICD-10-CM | POA: Insufficient documentation

## 2023-03-26 DIAGNOSIS — Z170 Estrogen receptor positive status [ER+]: Secondary | ICD-10-CM

## 2023-03-26 DIAGNOSIS — K5900 Constipation, unspecified: Secondary | ICD-10-CM | POA: Insufficient documentation

## 2023-03-26 HISTORY — DX: Anemia in neoplastic disease: D63.0

## 2023-03-26 HISTORY — DX: Hypomagnesemia: E83.42

## 2023-03-26 LAB — URINALYSIS, COMPLETE (UACMP) WITH MICROSCOPIC
Bilirubin Urine: NEGATIVE
Glucose, UA: >=500 mg/dL — AB
Ketones, ur: NEGATIVE mg/dL
Nitrite: POSITIVE — AB
Protein, ur: NEGATIVE mg/dL
Specific Gravity, Urine: 1.032 — ABNORMAL HIGH (ref 1.005–1.030)
WBC, UA: >50 WBC/hpf (ref 0–5)
pH: 5 (ref 5.0–8.0)

## 2023-03-26 MED ORDER — SODIUM CHLORIDE 0.9 % IV SOLN: Freq: Once | INTRAVENOUS | Status: AC

## 2023-03-26 MED ORDER — HEPARIN SOD (PORK) LOCK FLUSH 100 UNIT/ML IV SOLN
500.0000 [IU] | Freq: Once | INTRAVENOUS | Status: AC | PRN
  Administered 2023-03-26: 500 [IU]

## 2023-03-26 MED ORDER — POTASSIUM CHLORIDE 10 % PO SOLN: 20.0000 meq | Freq: Two times a day (BID) | ORAL | 2 refills | Status: DC

## 2023-03-26 MED ORDER — POTASSIUM CHLORIDE 10 MEQ/100ML IV SOLN
10.0000 meq | INTRAVENOUS | Status: AC
  Administered 2023-03-26 (×3): 10 meq via INTRAVENOUS
  Filled 2023-03-26 (×3): qty 100

## 2023-03-26 MED ORDER — MAGNESIUM SULFATE 2 GM/50ML IV SOLN
2.0000 g | Freq: Once | INTRAVENOUS | Status: AC
  Administered 2023-03-26: 2 g via INTRAVENOUS
  Filled 2023-03-26: qty 50

## 2023-03-26 MED ORDER — POTASSIUM CHLORIDE 10 MEQ/100ML IV SOLN
10.0000 meq | Freq: Once | INTRAVENOUS | Status: AC
  Administered 2023-03-26: 10 meq via INTRAVENOUS
  Filled 2023-03-26: qty 100

## 2023-03-26 MED ORDER — SODIUM CHLORIDE 0.9% FLUSH
10.0000 mL | INTRAVENOUS | Status: DC | PRN
  Administered 2023-03-26: 10 mL

## 2023-03-26 NOTE — Assessment & Plan Note (Signed)
Mild hypomagnesemia. I will give her IV magnesium replacement today.  We will continue to follow this.

## 2023-03-26 NOTE — Assessment & Plan Note (Addendum)
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 Ki67 was 60%. PET imaging in December revealed dominant finding of intensely hypermetabolic aggressive skeletal metastasis involving the calvarium, sternum, thoracic and lumbar spine, and pelvis, with potential pathologic fracture of the right femur with internal fixation. There was soft tissue extension into the anterior mediastinum associated with the manubrial metastasis. Multiple spinal vertebral body lesions had cortical destruction along the posterior margin. She started monthly Xgeva in December, and completed radiation therapy. She was receiving THP, but cycle 4 was delayed and dose reduction made by 20% due to left leg skin infection.   She continued with Herceptin/Perjeta until she had progression of disease. In January 2024, she developed increased pain of the left hip. She was found to have innumerable metastatic lesions throughout the pelvis and proximal femur. There is a non displaced pathologic fracture through a large lesion in the superior left acetabulum. She was referred to an orthopedic oncologist at [facility] and they recommended radiation and no surgery. She received her palliative radiation. She followed-up with [facility] in April and they do not recommend surgery, but will continue surveillance.   We then switched her to [facility] chemotherapy, but she has had significant toxicities of nausea, vomiting, diarrhea, and hypokalemia.  She continues monthly Xgeva.  She was also placed on hormonal therapy with Faslodex injections.  Repeat CT imaging in July revealed decreased soft tissue thickening around the left subclavian structures and thoracic inlet.  Left lateral breast mass was stable.  Widespread  mixed lytic and sclerotic osseous metastatic lesions were stable.  There was unchanged severe elevation of the left hemidiaphragm. Her pain is fairly well-controlled with the current regimen.  She continues Enhertu every 3 weeks.  She will proceed with 9th cycle this week.  She will continue denosumab every 6 weeks.

## 2023-03-26 NOTE — Assessment & Plan Note (Signed)
Worsening hypokalemia due to inability to tolerate potassium chloride tablets.  I will start her on liquid potassium 20 mEq twice daily.  She will receive IV potassium today.

## 2023-03-26 NOTE — Patient Instructions (Signed)
Potassium Chloride Injection What is this medication? POTASSIUM CHLORIDE (poe TASS i um KLOOR ide) prevents and treats low levels of potassium in your body. Potassium plays an important role in maintaining the health of your kidneys, heart, muscles, and nervous system. This medicine may be used for other purposes; ask your health care provider or pharmacist if you have questions. COMMON BRAND NAME(S): PROAMP What should I tell my care team before I take this medication? They need to know if you have any of these conditions: Addison disease Dehydration Diabetes (high blood sugar) Heart disease High levels of potassium in the blood Irregular heartbeat or rhythm Kidney disease Large areas of burned skin An unusual or allergic reaction to potassium, other medications, foods, dyes, or preservatives Pregnant or trying to get pregnant Breast-feeding How should I use this medication? This medication is injected into a vein. It is given in a hospital or clinic setting. Talk to your care team about the use of this medication in children. Special care may be needed. Overdosage: If you think you have taken too much of this medicine contact a poison control center or emergency room at once. NOTE: This medicine is only for you. Do not share this medicine with others. What if I miss a dose? This does not apply. This medication is not for regular use. What may interact with this medication? Do not take this medication with any of the following: Certain diuretics, such as spironolactone, triamterene Eplerenone Sodium polystyrene sulfonate This medication may also interact with the following: Certain medications for blood pressure or heart disease, such as lisinopril, losartan, quinapril, valsartan Medications that lower your chance of fighting infection, such as cyclosporine, tacrolimus NSAIDs, medications for pain and inflammation, such as ibuprofen or naproxen Other potassium supplements Salt  substitutes This list may not describe all possible interactions. Give your health care provider a list of all the medicines, herbs, non-prescription drugs, or dietary supplements you use. Also tell them if you smoke, drink alcohol, or use illegal drugs. Some items may interact with your medicine. What should I watch for while using this medication? Visit your care team for regular checks on your progress. Tell your care team if your symptoms do not start to get better or if they get worse. You may need blood work while you are taking this medication. Avoid salt substitutes unless you are told otherwise by your care team. What side effects may I notice from receiving this medication? Side effects that you should report to your care team as soon as possible: Allergic reactions--skin rash, itching, hives, swelling of the face, lips, tongue, or throat High potassium level--muscle weakness, fast or irregular heartbeat Side effects that usually do not require medical attention (report to your care team if they continue or are bothersome): Diarrhea Nausea Stomach pain Vomiting This list may not describe all possible side effects. Call your doctor for medical advice about side effects. You may report side effects to FDA at 1-800-FDA-1088. Where should I keep my medication? This medication is given in a hospital or clinic. It will not be stored at home. NOTE: This sheet is a summary. It may not cover all possible information. If you have questions about this medicine, talk to your doctor, pharmacist, or health care provider.  2024 Elsevier/Gold Standard (2022-02-09 00:00:00)  Magnesium Sulfate Injection What is this medication? MAGNESIUM SULFATE (mag NEE zee um SUL fate) prevents and treats low levels of magnesium in your body. It may also be used to prevent and treat  seizures during pregnancy in people with high blood pressure disorders, such as preeclampsia or eclampsia. Magnesium plays an  important role in maintaining the health of your muscles and nervous system. This medicine may be used for other purposes; ask your health care provider or pharmacist if you have questions. What should I tell my care team before I take this medication? They need to know if you have any of these conditions: Heart disease History of irregular heart beat Kidney disease An unusual or allergic reaction to magnesium sulfate, medications, foods, dyes, or preservatives Pregnant or trying to get pregnant Breast-feeding How should I use this medication? This medication is for infusion into a vein. It is given in a hospital or clinic setting. Talk to your care team about the use of this medication in children. While this medication may be prescribed for selected conditions, precautions do apply. Overdosage: If you think you have taken too much of this medicine contact a poison control center or emergency room at once. NOTE: This medicine is only for you. Do not share this medicine with others. What if I miss a dose? This does not apply. What may interact with this medication? Certain medications for anxiety or sleep Certain medications for seizures, such phenobarbital Digoxin Medications that relax muscles for surgery Narcotic medications for pain This list may not describe all possible interactions. Give your health care provider a list of all the medicines, herbs, non-prescription drugs, or dietary supplements you use. Also tell them if you smoke, drink alcohol, or use illegal drugs. Some items may interact with your medicine. What should I watch for while using this medication? Your condition will be monitored carefully while you are receiving this medication. You may need blood work done while you are receiving this medication. What side effects may I notice from receiving this medication? Side effects that you should report to your care team as soon as possible: Allergic reactions--skin rash,  itching, hives, swelling of the face, lips, tongue, or throat High magnesium level--confusion, drowsiness, facial flushing, redness, sweating, muscle weakness, fast or irregular heartbeat, trouble breathing Low blood pressure--dizziness, feeling faint or lightheaded, blurry vision Side effects that usually do not require medical attention (report to your care team if they continue or are bothersome): Headache Nausea This list may not describe all possible side effects. Call your doctor for medical advice about side effects. You may report side effects to FDA at 1-800-FDA-1088. Where should I keep my medication? This medication is given in a hospital or clinic and will not be stored at home. NOTE: This sheet is a summary. It may not cover all possible information. If you have questions about this medicine, talk to your doctor, pharmacist, or health care provider.  2024 Elsevier/Gold Standard (2021-04-12 00:00:00)

## 2023-03-26 NOTE — Assessment & Plan Note (Signed)
Dysuria possibly due to infection, which improved with Azo.  I will obtain a urinalysis and urine culture today.

## 2023-03-26 NOTE — Assessment & Plan Note (Signed)
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  She was treated with lumpectomy followed by adjuvant chemotherapy and radiation therapy. She was on adjuvant hormonal therapy with tamoxifen 20 mg daily from October 2015 to May 2020, but due to elevation of the liver transaminases was switched to anastrazole.  She then developed bone metastasis in November 2022.

## 2023-03-26 NOTE — Progress Notes (Signed)
[facility]  [address] [place],  [place]  [address] [phone]  Clinic Day:  03/26/2023  Referring physician: Dellia Beckwith, MD  ASSESSMENT & PLAN:   Assessment & Plan: Malignant neoplasm of upper-outer quadrant of left female breast [facility]) History of stage IIB hormone receptor positive left breast cancer, diagnosed in December 2014.  She was treated with lumpectomy followed by adjuvant chemotherapy and radiation therapy. She was on adjuvant hormonal therapy with tamoxifen 20 mg daily from October 2015 to May 2020, but due to elevation of the liver transaminases was switched to anastrazole.  She then developed bone metastasis in November 2022.  Malignant neoplasm metastatic to bone [facility]) Bone metastases diagnosed in November 2022, with multiple marrow replacing lesions within the proximal right femur within the ischium as well as the inferior rami. 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 Ki67 was 60%. PET imaging in December revealed dominant finding of intensely hypermetabolic aggressive skeletal metastasis involving the calvarium, sternum, thoracic and lumbar spine, and pelvis, with potential pathologic fracture of the right femur with internal fixation. There was soft tissue extension into the anterior mediastinum associated with the manubrial metastasis. Multiple spinal vertebral body lesions had cortical destruction along the posterior margin. She started monthly Xgeva in December, and completed radiation therapy. She was receiving THP, but cycle 4 was delayed and dose reduction made by 20% due to left leg skin infection.   She continued with Herceptin/Perjeta until she had progression of disease. In January 2024, she developed increased pain of the left hip. She was found to have innumerable metastatic lesions throughout the pelvis and proximal femur.  There is a non displaced pathologic fracture through a large lesion in the superior left acetabulum. She was referred to an orthopedic oncologist at [facility] and they recommended radiation and no surgery. She received her palliative radiation. She followed-up with [facility] in April and they do not recommend surgery, but will continue surveillance.   We then switched her to [facility] chemotherapy, but she has had significant toxicities of nausea, vomiting, diarrhea, and hypokalemia.  She continues monthly Xgeva.  She was also placed on hormonal therapy with Faslodex injections.  Repeat CT imaging in July revealed decreased soft tissue thickening around the left subclavian structures and thoracic inlet.  Left lateral breast mass was stable.  Widespread mixed lytic and sclerotic osseous metastatic lesions were stable.  There was unchanged severe elevation of the left hemidiaphragm. Her pain is fairly well-controlled with the current regimen.  She continues Enhertu every 3 weeks.  She will proceed with 9th cycle this week.  She will continue denosumab every 6 weeks.  Hypokalemia Worsening hypokalemia due to inability to tolerate potassium chloride tablets.  I will start her on liquid potassium 20 mEq twice daily.  She will receive IV potassium today.  Hypomagnesemia Mild hypomagnesemia. I will give her IV magnesium replacement today.  We will continue to follow this.  Dysuria Dysuria possibly due to infection, which improved with Azo.  I will obtain a urinalysis and urine culture today.  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 She was found to be iron deficient in September 2023 and was given IV iron supplement. Her hemoglobin had decreased from 12.1 to 10.8, then normalized again.  The hemoglobin has since fluctuated up and down with the most recent reading being 11.1.  Nausea with vomiting Daily nausea and vomiting despite adding Emend  and taking regular doses of Zofran, Compazine, and Ativan. We therefore added olanzapine and start at 5mg  at bedtime and  dexamethazone 4mg  to take twice daily for 3 to 5 days after chemo. She had improvement in the nausea and vomiting, but we increased olanzapine to 10 mg at bedtime.  She still reports daily nausea, but only occasional vomiting.  Constipation Constipation, most likely secondary to narcotic use.  Her last bowel movement was 2 days ago.  She is taking a laxative with a stool softener once a day.  I recommended she take 2 tablets at bedtime and if no results she take 2 tablets twice a day.    The patient understands the plans discussed today and is in agreement with them.  She knows to contact our office if she develops concerns prior to her next appointment.   I provided 30 minutes of face-to-face time during this encounter and > 50% was spent counseling as documented under my assessment and plan.    Adah Perl, PA-C  [facility] AT [facility] [address] [place] [address] Dept: [phone] Dept Fax: [phone]   Orders Placed This Encounter  Procedures   Culture, Urine    Standing Status:   Future    Number of Occurrences:   1    Standing Expiration Date:   03/25/2024   Urinalysis, Complete w Microscopic    Standing Status:   Future    Number of Occurrences:   1    Standing Expiration Date:   03/25/2024      CHIEF COMPLAINT:  CC: Metastatic HER2 receptor breast cancer  Current Treatment: Enhertu every 3 weeks with denosumab every 6 weeks  HISTORY OF PRESENT ILLNESS:  Anna [name] is a 52 y.o. female originally diagnosed with stage IIB (T2 N1a M0) hormone receptor positive left breast cancer in December 2014.  She was treated with lumpectomy.  Pathology revealed a 2.1 cm, grade 3, invasive ductal carcinoma with 1 lymph node positive for metastasis.  Lymphovascular invasion was seen.  Estrogen  and progesterone receptors were positive and her 2 Neu negative.  Ki 67 was 76%.  She received adjuvant chemotherapy with 4 cycles of doxorubicin and cyclophosphamide, followed by 12 weeks of weekly paclitaxel.  She then received adjuvant radiation to the left breast.  She was placed on tamoxifen 20 mg daily in October 2015.  She underwent testing for hereditary cancer syndromes with [employer] myRisk Hereditary Cancer panel, which did not reveal any clinically significant mutation or variants of uncertain significance, so no change in management was recommended.  She was found to have fatty infiltration of the liver on a CTA chest in June 2015.  Bone density scan in November 2017 was normal.  Bilateral diagnostic mammogram in November 2018 did not reveal any evidence of malignancy.   When she was seen in May 2019, she had mild elevation of the AST, which was felt to likely be due to medication effects. She had a bilateral diagnostic mammogram in January 2020.  At that time, she reported a lump in the left breast, so an ultrasound was also done.  Bilateral mammogram and left breast ultrasound did not reveal any suspicious findings.  Postsurgical scarring was seen in the left breast.  The area of palpable abnormality in the left breast was overlying the postsurgical scarring.  She was rescheduled in our office in May 2020  and found to have significant increase in her liver transaminases.  Tamoxifen was discontinued.  She was instructed to avoid alcohol and Tylenol.  CT abdomen and pelvis revealed severe hepatic steatosis.  Bone density scan in June 2020 revealed mild osteopenia with a T-score -1.4 in the spine.  Her last menstruation was in January 2015 and labs confirmed her postmenopausal status.  She was placed on anastrozole 1 mg daily in June 2020.  She was also referred to hepatology and saw Annamarie Major, NP, who diagnosed the patient with nonalcoholic fatty liver disease.  She was unsure if this was secondary to  tamoxifen, but felt it was unlikely.  Liver biopsy in July 2020 confirmed severe steatosis of 75-80% with moderate ballooning degeneration.  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  The patient has had improvement in the liver transaminases. Abdominal ultrasound per Ms. Elsie Stain which revealed an enlarging exophytic cyst of the right kidney.  This has been appreciated for many years, on PET imaging from 2014 this measured 10.6 cm, CT in May 2020 this was 12.4 cm, and recent abdominal ultrasound from July measured this at 14.5 cm. MRI done in November confirmed a benign cyst of the right kidney.  She continues to see Dr. Elsie Stain every 6 months.  She has also been seen by dermatology at [facility], with a good report.    She began to have right hip pain and functional disability with rapid worsening. MRI right hip in November 2022 revealed multiple marrow replacing lesions within the proximal femur within the ischium as well as the inferior rami. These findings were most consistent with metastatic disease and impending fracture. The primary and largest focus nearly fills the entire medullary space at the femoral neck with endosteal thinning and irregularity, most severely over the anterior cortex. She underwent right hip arthroplasty in November with Dr. Durene Romans. Surgical pathology confirmed metastatic carcinoma, consistent with patient's clinical history of primary breast carcinoma.    Prognostic profile confirmed estrogen receptor was positive at 95%, and progesterone receptor was positive at 40%. HER2 was positive 3+, which was negative on her original cancer. PET in December 2022 revealed dominant finding of intensely hypermetabolic aggressive skeletal metastasis involving the calvarium, sternum, thoracic and lumbar spine, and pelvis. There is potential pathologic fracture of the right femur with internal fixation, and soft tissue extension into the  anterior mediastinum associated with the manubrial metastasis. Multiple spinal vertebral body lesions have cortical destruction along the posterior margin. Baseline ECHO revealed normal left ventricular size and function with an ejection fraction of 55-60%. Bone density from December revealed mild osteopenia of the AP spine with a T-score of -1.2.   She was initially treated with docetaxel/trastuzumab/pertuzumab 4 cycles.  Due to left leg staphylococcal infection, cycle 4 was delayed and docetaxel dose was reduced by 20%.  Docetaxel was then discontinued due to difficulty with wound healing.  She has continued trastuzumab/pertuzumab/denosumab.  Most recent echocardiogram in December was stable with an ejection fraction of 55 to 60%.  She had elevation of the transaminases during chemotherapy, so we did not restart therapy.  The transaminases normalized in December.  She was started on monthly fulvestrant injections in January.    Unfortunately CT imaging in February revealed progressive disease within the lymph nodes and bone.  We therefore switched the HER2 targeted therapy to Enhertu every 3 weeks.  We continued fulvestrant and denosumab monthly.  She has had some  difficulty tolerating this due to nausea, vomiting, diarrhea, and hypokalemia, which has improved with a dose reduction. Repeat CT imaging in July revealed decreased soft tissue thickening around the left subclavian structures and thoracic inlet.  Left lateral breast mass was stable.  Widespread mixed lytic and sclerotic osseous metastatic lesions were stable.  There was unchanged severe elevation of the left hemidiaphragm.    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 (HCC)   07/23/2013 Cancer Staging   Staging form: Breast, AJCC 7th Edition - Clinical stage from 07/23/2013: Stage IIB (T2, N1, M0) -  Signed by Anna Beckwith, MD on 07/04/2020 Prognostic indicators: Pos LVI   09/22/2021 - 04/06/2022 Chemotherapy   Patient is on Treatment Plan : BREAST  Trastuzumab + Pertuzumab q21d      09/22/2021 - 09/21/2022 Chemotherapy   Patient is on Treatment Plan : BREAST Trastuzumab + Pertuzumab q21d     10/12/2022 -  Chemotherapy   Patient is on Treatment Plan : BREAST METASTATIC Fam-Trastuzumab Deruxtecan-nxki (Enhertu) (5.4) q21d     Malignant neoplasm metastatic to bone (HCC)  06/28/2021 Initial Diagnosis   Bone metastases (HCC)   09/22/2021 - 04/06/2022 Chemotherapy   Patient is on Treatment Plan : BREAST  Trastuzumab + Pertuzumab q21d      09/22/2021 - 09/21/2022 Chemotherapy   Patient is on Treatment Plan : BREAST Trastuzumab + Pertuzumab q21d     05/11/2022 Imaging   CT chest, abdomen and pelvis:  IMPRESSION:  1. Widespread bony metastatic disease shows increased sclerosis  since previous imaging. This is likely related to response to  therapy in the interval. There is a single lesion along the LEFT  iliac which shows continued lytic change and warrants attention on  follow-up  2. Interval development of pathologic fractures at in the upper  thoracic spine and at the thoracolumbar junction with associated  kyphosis.  3. No signs of solid organ or nodal metastatic disease.  4. Marked elevation of the LEFT hemidiaphragm as on the prior PET.  5. Aortic atherosclerosis.    10/12/2022 -  Chemotherapy   Patient is on Treatment Plan : BREAST METASTATIC Fam-Trastuzumab Deruxtecan-nxki (Enhertu) (5.4) q21d         INTERVAL HISTORY:  Anna [name] is here today for repeat clinical assessment. She reports constipation, last bowel movement 2 days ago. She reports dysuria, improved with Azo. She denies fevers or chills. She denies pain. Her appetite is good. Her weight has been stable. She has not been able to take potassium tablets as they cause her to gag.   REVIEW OF SYSTEMS:  Review of  Systems  Constitutional:  Positive for fatigue. Negative for appetite change, chills, fever and unexpected weight change.  HENT:   Negative for lump/mass, mouth sores and sore throat.   Respiratory:  Negative for cough and shortness of breath.   Cardiovascular:  Negative for chest pain and leg swelling.  Gastrointestinal:  Positive for constipation, nausea and vomiting. Negative for abdominal pain and diarrhea.  Endocrine: Negative for hot flashes.  Genitourinary:  Positive for dysuria. Negative for difficulty urinating, frequency and hematuria.   Musculoskeletal:  Positive for back pain. Negative for arthralgias and myalgias.  Skin:  Negative for rash.  Neurological:  Negative for dizziness, headaches and light-headedness.  Hematological:  Negative for adenopathy. Does not bruise/bleed easily.  Psychiatric/Behavioral:  Positive for sleep disturbance. Negative for depression.  The patient is not nervous/anxious.      VITALS:  Blood pressure 104/74, pulse (!) 108, temperature 99.3 F (37.4 C), temperature source Oral, resp. rate 20, height 4\' 8"  (1.422 m), weight 131 lb (59.4 kg), last menstrual period 08/18/2013, SpO2 97%.  Wt Readings from Last 3 Encounters:  03/26/23 131 lb 12 oz (59.8 kg)  03/26/23 131 lb (59.4 kg)  03/15/23 129 lb 1.9 oz (58.6 kg)    Body mass index is 29.37 kg/m.  Performance status (ECOG): 2 - Symptomatic, <50% confined to bed  PHYSICAL EXAM:  Physical Exam Vitals and nursing note reviewed.  Constitutional:      General: She is not in acute distress.    Appearance: Normal appearance. She is ill-appearing (Chronically ill-appearing).  HENT:     Head: Normocephalic and atraumatic.     Mouth/Throat:     Mouth: Mucous membranes are moist.     Pharynx: Oropharynx is clear. No oropharyngeal exudate or posterior oropharyngeal erythema.  Eyes:     General: No scleral icterus.    Extraocular Movements: Extraocular movements intact.     Conjunctiva/sclera:  Conjunctivae normal.     Pupils: Pupils are equal, round, and reactive to light.  Cardiovascular:     Rate and Rhythm: Normal rate and regular rhythm.     Heart sounds: Normal heart sounds. No murmur heard.    No friction rub. No gallop.  Pulmonary:     Effort: Pulmonary effort is normal.     Breath sounds: Examination of the left-lower field reveals decreased breath sounds. Decreased breath sounds present. No wheezing, rhonchi or rales.  Abdominal:     General: There is no distension.     Palpations: Abdomen is soft. There is no hepatomegaly, splenomegaly or mass.     Tenderness: There is no abdominal tenderness.  Musculoskeletal:        General: Normal range of motion.     Cervical back: Normal range of motion and neck supple. No tenderness.     Right lower leg: No edema.     Left lower leg: No edema.  Lymphadenopathy:     Cervical: No cervical adenopathy.     Upper Body:     Right upper body: No supraclavicular or axillary adenopathy.     Left upper body: No supraclavicular or axillary adenopathy.  Skin:    General: Skin is warm and dry.     Coloration: Skin is not jaundiced.     Findings: No rash.  Neurological:     Mental Status: She is alert and oriented to person, place, and time.     Cranial Nerves: No cranial nerve deficit.  Psychiatric:        Mood and Affect: Mood normal.        Behavior: Behavior normal.        Thought Content: Thought content normal.     LABS:      Latest Ref Rng & Units 03/25/2023    3:19 PM 03/01/2023   12:00 AM 02/12/2023    1:57 PM  CBC  WBC 4.0 - 10.5 K/uL 5.7  6.5       8.6     3.8   Hemoglobin 12.0 - 15.0 g/dL 86.5  78.4       69.6     11.1   Hematocrit 36.0 - 46.0 % 35.8  33       36     36.2   Platelets 150 - 400 K/uL 315  368  310     296      This result is from an external source.   Multiple values from one day are sorted in reverse-chronological order      Latest Ref Rng & Units 03/25/2023    3:19 PM 03/15/2023    12:00 AM 03/01/2023   12:00 AM  CMP  Glucose 70 - 99 mg/dL 562     BUN 6 - 20 mg/dL 17  13     8       Creatinine 0.44 - 1.00 mg/dL 1.30  0.5     0.5      Sodium 135 - 145 mmol/L 143  138     138      Potassium 3.5 - 5.1 mmol/L 2.7  3.8     2.9      Chloride 98 - 111 mmol/L 101  102     103      CO2 22 - 32 mmol/L 30  24     30       Calcium 8.9 - 10.3 mg/dL 9.5  8.9     8.6      Total Protein 6.5 - 8.1 g/dL 5.8     Total Bilirubin 0.3 - 1.2 mg/dL 0.2     Alkaline Phos 38 - 126 U/L 55  70     78      AST 15 - 41 U/L 17  24     37      ALT 0 - 44 U/L 14  17     20          This result is from an external source.     Lab Results  Component Value Date   CEA1 2.2 07/20/2021   /  CEA  Date Value Ref Range Status  07/20/2021 2.2 0.0 - 4.7 ng/mL Final    Comment:    (NOTE)                             Nonsmokers          <3.9                             Smokers             <5.6 [employer] Electrochemiluminescence Immunoassay (ECLIA) Values obtained with different assay methods or kits cannot be used interchangeably.  Results cannot be interpreted as absolute evidence of the presence or absence of malignant disease. Performed At: [facility] [address] [place], [place] [id number] Jolene Schimke MD EX:[id number]    No results found for: "PSA1" No results found for: "CAN199" No results found for: "CAN125"  No results found for: "TOTALPROTELP", "ALBUMINELP", "A1GS", "A2GS", "BETS", "BETA2SER", "GAMS", "MSPIKE", "SPEI" Lab Results  Component Value Date   TIBC 256 01/14/2023   TIBC 287 04/24/2022   TIBC 279 10/13/2021   FERRITIN 333 (H) 01/14/2023   FERRITIN 119 04/24/2022   FERRITIN 206 10/13/2021   IRONPCTSAT 16 01/14/2023   IRONPCTSAT 9 (L) 04/24/2022   IRONPCTSAT 13 10/13/2021   No results found for: "LDH"  STUDIES:  No results found.    HISTORY:   Past Medical History:  Diagnosis Date   Achilles tendinitis of left lower extremity 04/22/2018    Acute peptic ulcer without hemorrhage and without perforation 11/29/2020   Acute sinusitis 11/29/2020   Anemia in neoplastic disease 03/26/2023   Bipolar  disorder (HCC) 11/29/2020   Breast cancer (HCC)    Cancer (HCC)    Cardiac murmur    Chest pain    Chronic fatigue syndrome 11/29/2020   Depression    Essential hypertension 11/29/2020   Hepatic steatosis determined by biopsy of liver 07/04/2020   HLD (hyperlipidemia)    Hypertension    Hypomagnesemia 03/26/2023   Hypothyroidism 11/29/2020   Insomnia 11/29/2020   Iron deficiency anemia 06/05/2022   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   Migraine 11/29/2020   Mild intermittent asthma 11/29/2020   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   Mixed hyperlipidemia 11/29/2020   Obesity due to excess calories    Osteopenia after menopause 07/04/2020   Other long term (current) drug therapy 11/29/2020   Other vitamin B12 deficiency anemias 11/29/2020   Personal history of malignant neoplasm of breast 11/29/2020   Pre-diabetes    Renal cyst, acquired, right 07/04/2020   14.5 cm in October 2021   Retrocalcaneal bursitis (back of heel), left 04/22/2018   Seasonal allergic rhinitis 11/29/2020   Tightness of heel cord, left 04/22/2018   Type 2 diabetes mellitus without complications (HCC) 11/29/2020   Vitamin D deficiency 11/29/2020    Past Surgical History:  Procedure Laterality Date   CESAREAN SECTION     IR BONE TUMOR(S)RF ABLATION  05/29/2022   IR BONE TUMOR(S)RF ABLATION  05/29/2022   IR KYPHO EA ADDL LEVEL THORACIC OR LUMBAR  05/29/2022   IR KYPHO THORACIC WITH BONE BIOPSY  05/29/2022   IR RADIOLOGIST EVAL & MGMT  05/22/2022   IR RADIOLOGIST EVAL & MGMT  06/12/2022   LIVER BIOPSY  01/2019   lumpectomy  2014   Left Breast   PORT-A-CATH REMOVAL     PORTACATH PLACEMENT  08/28/2013   STOMACH SURGERY     Tummy Tuck   TOTAL HIP ARTHROPLASTY Right 07/04/2021   Procedure:  TOTAL HIP ARTHROPLASTY;  Surgeon: Durene Romans, MD;  Location: [facility];  Service: Orthopedics;  Laterality: Right;  90   WISDOM TOOTH EXTRACTION      Family History  Problem Relation Age of Onset   Hyperlipidemia Mother    Diabetes Father    Stroke Father    Colon cancer Neg Hx    Colon polyps Neg Hx    Esophageal cancer Neg Hx    Rectal cancer Neg Hx    Stomach cancer Neg Hx     Social History:  reports that she quit smoking about 20 years ago. Her smoking use included cigarettes. She has never used smokeless tobacco. She reports current alcohol use. She reports that she does not use drugs.The patient is accompanied by her husband today.  Allergies:  Allergies  Allergen Reactions   Reglan [Metoclopramide]     Made patient feel really funny when taking this medication     Current Medications: Current Outpatient Medications  Medication Sig Dispense Refill   Potassium Chloride 10 % SOLN Take 20 mEq by mouth 2 (two) times daily. 900 mL 2   albuterol (VENTOLIN HFA) 108 (90 Base) MCG/ACT inhaler Inhale 2 puffs into the lungs every 6 (six) hours as needed for shortness of breath.     ARIPiprazole (ABILIFY) 5 MG tablet Take 2.5 mg by mouth daily.     aspirin 81 MG EC tablet Take 81 mg by mouth daily. Swallow whole.     atorvastatin (LIPITOR) 20 MG tablet TAKE ONE TABLET BY MOUTH EVERY DAY 90 tablet 0  augmented betamethasone dipropionate (DIPROLENE-AF) 0.05 % cream      calcium carbonate (TUMS - DOSED IN MG ELEMENTAL CALCIUM) 500 MG chewable tablet Chew 1 tablet by mouth daily. 4 gummies daily/ not tablet form .     dapagliflozin propanediol (FARXIGA) 10 MG TABS tablet TAKE ONE TABLET BY MOUTH EVERY DAY 30 tablet 2   desvenlafaxine (PRISTIQ) 50 MG 24 hr tablet Take 50 mg by mouth daily.     dexamethasone (DECADRON) 4 MG tablet Take 1 tablet (4 mg total) by mouth 2 (two) times daily. For 5 days after chemotherapy 20 tablet 2   diphenoxylate-atropine (LOMOTIL) 2.5-0.025 MG tablet TAKE  TWO TABLETS BY MOUTH FOUR TIMES A DAY AS NEEDED FOR DIARRHEA OR LOOSE STOOLS 100 tablet 1   labetalol (NORMODYNE) 200 MG tablet Take 1 tablet (200 mg total) by mouth 2 (two) times daily. 180 tablet 3   loratadine (CLARITIN) 10 MG tablet TAKE ONE TABLET BY MOUTH DAILY 30 tablet 2   LORazepam (ATIVAN) 1 MG tablet TAKE ONE TABLET BY MOUTH EVERY 8 HOURS 30 tablet 1   magic mouthwash (lidocaine, diphenhydrAMINE, alum & mag hydroxide) suspension Take 5 mLs by mouth every 3 (three) hours as needed. Swish and spit; for throat/mouth pain     naloxone (NARCAN) nasal spray 4 mg/0.1 mL One spray in nostril as needed, may repeat every 2 to 3 minutes until medical assistance becomes available 4 each 1   nitroGLYCERIN (NITROSTAT) 0.4 MG SL tablet Place 0.4 mg under the tongue every 5 (five) minutes as needed for chest pain.     nystatin (MYCOSTATIN/NYSTOP) powder Apply 1 Application topically 2 (two) times daily.     OLANZapine (ZYPREXA) 10 MG tablet Take 1 tablet (10 mg total) by mouth at bedtime. (Patient not taking: Reported on 03/26/2023) 30 tablet 5   ondansetron (ZOFRAN) 8 MG tablet Take 1 tablet (8 mg total) by mouth every 8 (eight) hours as needed for nausea or vomiting. 60 tablet 5   Oxycodone HCl 10 MG TABS Take 1 tablet (10 mg total) by mouth every 4 (four) hours as needed. for pain 180 tablet 0   OZEMPIC, 0.25 OR 0.5 MG/DOSE, 2 MG/3ML SOPN Inject into the skin.     prochlorperazine (COMPAZINE) 10 MG tablet TAKE ONE TABLET BY MOUTH EVERY 6 HOURS AS NEEDED FOR NAUSEA/VOMITING 90 tablet 3   Current Facility-Administered Medications  Medication Dose Route Frequency Provider Last Rate Last Admin   0.9 %  sodium chloride infusion  500 mL Intravenous Once Lynann Bologna, MD       technetium sestamibi generic (CARDIOLITE) injection 10.5 millicurie  10.5 millicurie Intravenous Once PRN Revankar, Aundra Dubin, MD       Facility-Administered Medications Ordered in Other Visits  Medication Dose Route Frequency  Provider Last Rate Last Admin   heparin lock flush 100 unit/mL  500 Units Intracatheter Once PRN Ilda Basset A, NP       potassium chloride 10 mEq in 100 mL IVPB  10 mEq Intravenous Q1 Hr x 3 ,  A, PA-C 100 mL/hr at 03/26/23 1258 10 mEq at 03/26/23 1258   sodium chloride flush (NS) 0.9 % injection 10 mL  10 mL Intracatheter PRN Adah Perl, PA-C

## 2023-03-26 NOTE — Assessment & Plan Note (Signed)
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 She was found to be iron deficient in September 2023 and was given IV iron supplement. Her hemoglobin had decreased from 12.1 to 10.8, then normalized again.  The hemoglobin has since fluctuated up and down with the most recent reading being 11.1.

## 2023-03-26 NOTE — Assessment & Plan Note (Signed)
Daily nausea and vomiting despite adding Emend and taking regular doses of Zofran, Compazine, and Ativan. We therefore added olanzapine and start at 5mg  at bedtime and  dexamethazone 4mg  to take twice daily for 3 to 5 days after chemo. She had improvement in the nausea and vomiting, but we increased olanzapine to 10 mg at bedtime.  She still reports daily nausea, but only occasional vomiting.

## 2023-03-26 NOTE — Addendum Note (Signed)
Addended by: Domenic Schwab on: 03/26/2023 10:36 AM   Modules accepted: Orders

## 2023-03-26 NOTE — Assessment & Plan Note (Signed)
Constipation, most likely secondary to narcotic use.  Her last bowel movement was 2 days ago.  She is taking a laxative with a stool softener once a day.  I recommended she take 2 tablets at bedtime and if no results she take 2 tablets twice a day.

## 2023-03-27 ENCOUNTER — Other Ambulatory Visit: Payer: BC Managed Care – PPO

## 2023-03-27 ENCOUNTER — Ambulatory Visit: Payer: BC Managed Care – PPO | Admitting: Oncology

## 2023-03-28 ENCOUNTER — Telehealth: Payer: Self-pay

## 2023-03-28 ENCOUNTER — Other Ambulatory Visit: Payer: Self-pay | Admitting: Hematology and Oncology

## 2023-03-28 MED ORDER — CIPROFLOXACIN HCL 500 MG PO TABS: 500.0000 mg | ORAL_TABLET | Freq: Two times a day (BID) | ORAL | Status: DC

## 2023-03-28 MED FILL — Fosaprepitant Dimeglumine For IV Infusion 150 MG (Base Eq): INTRAVENOUS | Qty: 5 | Status: AC

## 2023-03-28 MED FILL — Dexamethasone Sodium Phosphate Inj 100 MG/10ML: INTRAMUSCULAR | Qty: 1 | Status: AC

## 2023-03-28 NOTE — Telephone Encounter (Signed)
Patient notified and voiced understanding.

## 2023-03-28 NOTE — Telephone Encounter (Signed)
-----   Message from Adah Perl sent at 03/28/2023 10:56 AM EDT ----- Please let her know she has a uti, I sent in cipro for 10 days. Thanks

## 2023-03-29 ENCOUNTER — Inpatient Hospital Stay: Payer: BC Managed Care – PPO

## 2023-03-29 ENCOUNTER — Encounter: Payer: Self-pay | Admitting: Oncology

## 2023-03-29 VITALS — BP 105/60 | HR 90 | Temp 98.0°F | Resp 18 | Ht <= 58 in | Wt 130.0 lb

## 2023-03-29 DIAGNOSIS — C7951 Secondary malignant neoplasm of bone: Secondary | ICD-10-CM

## 2023-03-29 DIAGNOSIS — C50412 Malignant neoplasm of upper-outer quadrant of left female breast: Secondary | ICD-10-CM

## 2023-03-29 DIAGNOSIS — Z5111 Encounter for antineoplastic chemotherapy: Secondary | ICD-10-CM | POA: Diagnosis not present

## 2023-03-29 MED ORDER — PALONOSETRON HCL INJECTION 0.25 MG/5ML
0.2500 mg | Freq: Once | INTRAVENOUS | Status: AC
  Administered 2023-03-29: 0.25 mg via INTRAVENOUS
  Filled 2023-03-29: qty 5

## 2023-03-29 MED ORDER — SODIUM CHLORIDE 0.9 % IV SOLN
10.0000 mg | Freq: Once | INTRAVENOUS | Status: AC
  Administered 2023-03-29: 10 mg via INTRAVENOUS
  Filled 2023-03-29: qty 10

## 2023-03-29 MED ORDER — HEPARIN SOD (PORK) LOCK FLUSH 100 UNIT/ML IV SOLN
500.0000 [IU] | Freq: Once | INTRAVENOUS | Status: AC | PRN
  Administered 2023-03-29: 500 [IU]

## 2023-03-29 MED ORDER — SODIUM CHLORIDE 0.9% FLUSH
10.0000 mL | INTRAVENOUS | Status: DC | PRN
  Administered 2023-03-29: 10 mL

## 2023-03-29 MED ORDER — SODIUM CHLORIDE 0.9 % IV SOLN
150.0000 mg | Freq: Once | INTRAVENOUS | Status: AC
  Administered 2023-03-29: 150 mg via INTRAVENOUS
  Filled 2023-03-29: qty 150

## 2023-03-29 MED ORDER — DIPHENHYDRAMINE HCL 25 MG PO CAPS: 50.0000 mg | ORAL_CAPSULE | Freq: Once | ORAL | Status: DC

## 2023-03-29 MED ORDER — FULVESTRANT 250 MG/5ML IM SOSY
500.0000 mg | PREFILLED_SYRINGE | Freq: Once | INTRAMUSCULAR | Status: AC
  Administered 2023-03-29: 500 mg via INTRAMUSCULAR
  Filled 2023-03-29: qty 10

## 2023-03-29 MED ORDER — ACETAMINOPHEN 325 MG PO TABS: 650.0000 mg | ORAL_TABLET | Freq: Once | ORAL | Status: DC

## 2023-03-29 MED ORDER — DEXTROSE 5 % IV SOLN: Freq: Once | INTRAVENOUS | Status: AC

## 2023-03-29 MED ORDER — FAM-TRASTUZUMAB DERUXTECAN-NXKI CHEMO 100 MG IV SOLR
5.4000 mg/kg | Freq: Once | INTRAVENOUS | Status: AC
  Administered 2023-03-29: 340 mg via INTRAVENOUS
  Filled 2023-03-29: qty 17

## 2023-03-29 NOTE — Patient Instructions (Signed)
Fam-Trastuzumab Deruxtecan Injection What is this medication? FAM-TRASTUZUMAB DERUXTECAN (fam-tras TOOZ eu mab DER ux TEE kan) treats some types of cancer. It works by blocking a protein that causes cancer cells to grow and multiply. This helps to slow or stop the spread of cancer cells. This medicine may be used for other purposes; ask your health care provider or pharmacist if you have questions. COMMON BRAND NAME(S): ENHERTU What should I tell my care team before I take this medication? They need to know if you have any of these conditions: Heart disease Heart failure Infection, especially a viral infection, such as chickenpox, cold sores, or herpes Liver disease Lung or breathing disease, such as asthma or COPD An unusual or allergic reaction to fam-trastuzumab deruxtecan, other medications, foods, dyes, or preservatives Pregnant or trying to get pregnant Breast-feeding How should I use this medication? This medication is injected into a vein. It is given by your care team in a hospital or clinic setting. A special MedGuide will be given to you before each treatment. Be sure to read this information carefully each time. Talk to your care team about the use of this medication in children. Special care may be needed. Overdosage: If you think you have taken too much of this medicine contact a poison control center or emergency room at once. NOTE: This medicine is only for you. Do not share this medicine with others. What if I miss a dose? It is important not to miss your dose. Call your care team if you are unable to keep an appointment. What may interact with this medication? Interactions are not expected. This list may not describe all possible interactions. Give your health care provider a list of all the medicines, herbs, non-prescription drugs, or dietary supplements you use. Also tell them if you smoke, drink alcohol, or use illegal drugs. Some items may interact with your  medicine. What should I watch for while using this medication? Visit your care team for regular checks on your progress. Tell your care team if your symptoms do not start to get better or if they get worse. Your condition will be monitored carefully while you are receiving this medication. Do not become pregnant while taking this medication or for 7 months after stopping it. Women should inform their care team if they wish to become pregnant or think they might be pregnant. Men should not father a child while taking this medication and for 4 months after stopping it. There is potential for serious side effects to an unborn child. Talk to your care team for more information. Do not breast-feed an infant while taking this medication or for 7 months after the last dose. This medication has caused decreased sperm counts in some men. This may make it more difficult to father a child. Talk to your care team if you are concerned about your fertility. This medication may increase your risk to bruise or bleed. Call your care team if you notice any unusual bleeding. Be careful brushing or flossing your teeth or using a toothpick because you may get an infection or bleed more easily. If you have any dental work done, tell your dentist you are receiving this medication. This medication may cause dry eyes and blurred vision. If you wear contact lenses, you may feel some discomfort. Lubricating eye drops may help. See your care team if the problem does not go away or is severe. This medication may increase your risk of getting an infection. Call your care team for   advice if you get a fever, chills, sore throat, or other symptoms of a cold or flu. Do not treat yourself. Try to avoid being around people who are sick. Avoid taking medications that contain aspirin, acetaminophen, ibuprofen, naproxen, or ketoprofen unless instructed by your care team. These medications may hide a fever. What side effects may I notice from  receiving this medication? Side effects that you should report to your care team as soon as possible: Allergic reactions--skin rash, itching, hives, swelling of the face, lips, tongue, or throat Dry cough, shortness of breath or trouble breathing Infection--fever, chills, cough, sore throat, wounds that don't heal, pain or trouble when passing urine, general feeling of discomfort or being unwell Heart failure--shortness of breath, swelling of the ankles, feet, or hands, sudden weight gain, unusual weakness or fatigue Unusual bruising or bleeding Side effects that usually do not require medical attention (report these to your care team if they continue or are bothersome): Constipation Diarrhea Hair loss Muscle pain Nausea Vomiting This list may not describe all possible side effects. Call your doctor for medical advice about side effects. You may report side effects to FDA at 1-800-FDA-1088. Where should I keep my medication? This medication is given in a hospital or clinic. It will not be stored at home. NOTE: This sheet is a summary. It may not cover all possible information. If you have questions about this medicine, talk to your doctor, pharmacist, or health care provider.  2024 [employer]/Gold Standard (2021-05-16 00:00:00)  

## 2023-04-08 ENCOUNTER — Other Ambulatory Visit: Payer: Self-pay | Admitting: Hematology and Oncology

## 2023-04-08 DIAGNOSIS — S22078A Other fracture of T9-T10 vertebra, initial encounter for closed fracture: Secondary | ICD-10-CM

## 2023-04-08 DIAGNOSIS — C7951 Secondary malignant neoplasm of bone: Secondary | ICD-10-CM

## 2023-04-08 DIAGNOSIS — M545 Low back pain, unspecified: Secondary | ICD-10-CM

## 2023-04-08 DIAGNOSIS — R0789 Other chest pain: Secondary | ICD-10-CM

## 2023-04-09 ENCOUNTER — Encounter: Payer: Self-pay | Admitting: Oncology

## 2023-04-09 ENCOUNTER — Other Ambulatory Visit: Payer: Self-pay | Admitting: Hematology and Oncology

## 2023-04-11 NOTE — Progress Notes (Signed)
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 She was on adjuvant hormonal therapy with tamoxifen 20 mg daily from October 2015 to May 2020, but due to elevation of the liver transaminases, was switched to anastrazole.   Malignant neoplasm metastatic to bone [facility]) New bone metastases, November 2022, with multiple marrow replacing lesions within the proximal right femur within the ischium as well as the inferior rami. 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 Ki67 was 60%. PET imaging from December 19th revealed dominant finding of intensely hypermetabolic aggressive skeletal metastasis involving the calvarium, sternum, thoracic and lumbar spine, and pelvis, with potential pathologic fracture of the right femur with internal fixation. There is soft tissue extension into the anterior mediastinum associated with the manubrial metastasis. Multiple spinal vertebral body lesions have cortical destruction along the posterior margin. She started monthly Xgeva in December, and completed radiation therapy. She was receiving THP but cycle 4 was delayed and dose reduction made by 20% due to left leg skin infection. She continued with Herceptin/Perjeta until she had progression of disease. We then  switched her to [facility] chemotherapy, but she has had significant toxicities of nausea, vomiting, diarrhea, and hypokalemia. These have been slowly improving and we will re scan her next time to assess her response. She was also placed on hormonal therapy with Faslodex injections. The current scans from July 22nd show improvement with stable lesions of the bone.   Increased Left Hip Pain In January 2024, she developed increased pain of the left hip. She was found to have innumerable metastatic lesions throughout the pelvis and proximal femur. There is a non displaced pathologic fracture through a large lesion in the superior left acetabulum. She was referred to an orthopedic oncologist at [facility] and they recommended radiation and no surgery. She has now received her palliative radiation. She followed-up with [facility] in April and they do not recommend surgery but will continue surveillance.    Hypokalemia This was severe due to intolerance to oral supplement and persistent nausea, vomiting, and diarrhea. She is supposed to be taking potassium at TID but is unable tolerate. Her potassium is down to 2.9 today. I prescribed IV potassium today and she will have another 30 meq IV potassium on Thursday September, 5th, before she has her chemotherapy on September, 6th.   Hypocalcemia Her calcium is better but this is with taking 6 tablets daily.  Her vitamin D level was adequate.  I recommended she increase her calcium supplement to 1 tablets TID.  I stressed the importance of her calcium supplement.   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  She was found to be iron deficient in September 2023 and was given IV iron supplement. Her hemoglobin had decreased from 12.1 to  10.8 and back to 12.1 and now down to 11.1.   Abnormal Mammogram She does have  thickening of the skin and trabecular thickening of the left breast. I find no significant change on physical exam and feel that a lot of this can be post radiation change. In view of her widespread bone metastases, I will not pursue further evaluation but will continue periodic breast exams.  On CT scan this nodule of the left breast appears to be 12 mm, down from 13 mm in size   Large Cyst of the Right Kidney This arises from the lower pole and has been present for a long time, previously measuring 11 mm on last scan from September, 2023. During her CT simulation for her left hip radiation, the physicist noted that the cyst is enlarged and so we did re-evaluate.  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    Elevated left hemidiaphragm This is a new finding since the scan of 2020 and may be related to mediastinal involvement.  Is associated with atelectasis at the left base and explains why I heard decreased breath sounds in this area.   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  For now we will continue the Enhertu and Faslodex but keep this in mind for the future.   Nausea/Vomiting She was having this daily despite adding Emend and taking regular doses of Zofran, Compazine, and Ativan. I therefore added olanzapine and start at 5mg  at bedtime and she seems to be doing better. I have also added Dexamethazone 4mg  to take twice daily for 3 to 5 days after chemo. This is doing better now but I will increase the Olanzapine from 5 mg to 10 mg at bedtime.    Recurrent leg ulcers Currently resolved.   Plan:  I will prescribe Prilosec 40mg . Her day 1 cycle 10 of Enhertu is scheduled on 04/19/2023. Her WBC is 4.6, hemoglobin dropped from 11.1 to 10.2, and a platelet count is 343,000. She has a low potassium of 3.1, calcium of 8.7, albumin of 3.1, and  a stable low total protein of 5.8. She continues oral calcium supplement 4 times daily. She informed me that she stopped taking the liquid potassium as it does not agree with her and started oral potassium supplements. I will schedule for her to receive IV potassium on 04/18/2023. She will continue her Faslodex injections and now receive her Xgeva injection once every 6 weeks. Her CT chest, abdomen and pelvis done on 03/01/2023 shows some improvement. I will see her back in 3 weeks with CBC and CMP. She will be due for her next Enhertu. The patient understands the plans discussed today and is in agreement with them.  She knows to contact our office if she develops concerns prior to her next appointment.   I provided 16 minutes of face-to-face time during this encounter and > 50% was spent counseling as documented under my assessment and plan.     Dellia Beckwith, MD  [facility] [address] [place] [address] Dept: [phone] Dept Fax: [phone]   No orders of the defined types were placed in this encounter.    CHIEF COMPLAINT:  CC: A 52 year old female with breast cancer, metastatic to bone and nodes  Current Treatment:  Enhertu  and Rivka Barbara  INTERVAL HISTORY:  Anna [name] is here today for repeat clinical assessment for her breast cancer metastatic to bone.  Her Guardant360 results came back and revealed her to have a PIK3CA mutation. We will continue the Enhertu for now. She completed radiation at the left hip on 12/25/2022. Patient states that she doesn't feel the best. She complains of an "sour" feeling in her stomach that has persisted for the past week with occasional vomiting and chronic lower back pain rating 3/10. She has since finished a course of antibiotics for an UTI. I will prescribe Prilosec 40mg . Her day 1 cycle 10 of Enhertu is scheduled on 04/19/2023. Her WBC is 4.6, hemoglobin dropped from 11.1 to  10.2, and a platelet count is 343,000. She has a low potassium of 3.1, calcium of 8.7, albumin of 3.1, and a stable low total protein of 5.8. She continues oral calcium supplement 4 times daily. She informed me that she stopped taking the liquid potassium as it does not agree with her and started oral potassium supplements. I will schedule for her to receive IV potassium on 04/18/2023. She will continue her Faslodex injections and now receive her Xgeva injection once every 6 weeks. Her CT chest, abdomen and pelvis done on 03/01/2023 is stable. I will see her back in 3 weeks with CBC and CMP. She will be due for her next Enhertu. She denies signs of infection such as sore throat, sinus drainage, cough, or urinary symptoms.  She denies fevers or recurrent chills. She denies pain. She denies nausea, vomiting, chest pain, dyspnea or cough. Her appetite is not bad and her weight has been stable. She is accompanied today by her husband.      I have reviewed the past medical history, past surgical history, social history and family history with the patient and they are unchanged from previous note.  ALLERGIES:  is allergic to reglan [metoclopramide].  MEDICATIONS:  Current Outpatient Medications  Medication Sig Dispense Refill   albuterol (VENTOLIN HFA) 108 (90 Base) MCG/ACT inhaler Inhale 2 puffs into the lungs every 6 (six) hours as needed for shortness of breath.     ARIPiprazole (ABILIFY) 5 MG tablet Take 2.5 mg by mouth daily.     aspirin 81 MG EC tablet Take 81 mg by mouth daily. Swallow whole.     atorvastatin (LIPITOR) 20 MG tablet TAKE ONE TABLET BY MOUTH EVERY DAY 90 tablet 0   augmented betamethasone dipropionate (DIPROLENE-AF) 0.05 % cream      calcium carbonate (TUMS - DOSED IN MG ELEMENTAL CALCIUM) 500 MG chewable tablet Chew 1 tablet by mouth daily. 4 gummies daily/ not tablet form .     dapagliflozin propanediol (FARXIGA) 10 MG TABS tablet TAKE ONE TABLET BY MOUTH EVERY DAY 30 tablet 2    desvenlafaxine (PRISTIQ) 50 MG 24 hr tablet Take 50 mg by mouth daily.     dexamethasone (DECADRON) 4 MG tablet Take 1 tablet (4 mg total) by mouth 2 (two) times daily. For 5 days after chemotherapy 20 tablet 2   diphenoxylate-atropine (LOMOTIL) 2.5-0.025 MG tablet TAKE TWO TABLETS BY MOUTH FOUR TIMES A DAY AS NEEDED FOR DIARRHEA OR LOOSE STOOLS 100 tablet 1   labetalol (NORMODYNE) 200 MG tablet Take 1 tablet (200 mg total) by mouth 2 (two) times daily. 180 tablet 3   loratadine (CLARITIN) 10 MG tablet TAKE ONE TABLET BY MOUTH DAILY 30 tablet 2   LORazepam (ATIVAN) 1 MG tablet TAKE ONE TABLET BY MOUTH EVERY  8 HOURS 30 tablet 1   magic mouthwash (lidocaine, diphenhydrAMINE, alum & mag hydroxide) suspension Take 5 mLs by mouth every 3 (three) hours as needed. Swish and spit; for throat/mouth pain     naloxone (NARCAN) nasal spray 4 mg/0.1 mL One spray in nostril as needed, may repeat every 2 to 3 minutes until medical assistance becomes available 4 each 1   nitroGLYCERIN (NITROSTAT) 0.4 MG SL tablet Place 0.4 mg under the tongue every 5 (five) minutes as needed for chest pain.     nystatin (MYCOSTATIN/NYSTOP) powder Apply 1 Application topically 2 (two) times daily.     omeprazole (PRILOSEC) 40 MG capsule Take 1 capsule (40 mg total) by mouth at bedtime. 30 capsule 5   ondansetron (ZOFRAN) 8 MG tablet Take 1 tablet (8 mg total) by mouth every 8 (eight) hours as needed for nausea or vomiting. 60 tablet 5   Oxycodone HCl 10 MG TABS TAKE ONE TABLET BY MOUTH EVERY 4 HOURS AS NEEDED FOR PAIN 180 tablet 0   OZEMPIC, 0.25 OR 0.5 MG/DOSE, 2 MG/3ML SOPN Inject into the skin. (Patient not taking: Reported on 04/16/2023)     Potassium Chloride 10 % SOLN Take 20 mEq by mouth 2 (two) times daily. 900 mL 2   prochlorperazine (COMPAZINE) 10 MG tablet TAKE ONE TABLET BY MOUTH EVERY 6 HOURS AS NEEDED FOR NAUSEA/VOMITING 90 tablet 3   Current Facility-Administered Medications  Medication Dose Route Frequency Provider  Last Rate Last Admin   0.9 %  sodium chloride infusion  500 mL Intravenous Once Lynann Bologna, MD       technetium sestamibi generic (CARDIOLITE) injection 10.5 millicurie  10.5 millicurie Intravenous Once PRN Revankar, Aundra Dubin, MD        HISTORY OF PRESENT ILLNESS:   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 (HCC)   07/23/2013 Cancer Staging   Staging form: Breast, AJCC 7th Edition - Clinical stage from 07/23/2013: Stage IIB (T2, N1, M0) - Signed by Dellia Beckwith, MD on 07/04/2020 Prognostic indicators: Pos LVI   09/22/2021 - 04/06/2022 Chemotherapy   Patient is on Treatment Plan : BREAST  Trastuzumab + Pertuzumab q21d      09/22/2021 - 09/21/2022 Chemotherapy   Patient is on Treatment Plan : BREAST Trastuzumab + Pertuzumab q21d     10/12/2022 -  Chemotherapy   Patient is on Treatment Plan : BREAST METASTATIC Fam-Trastuzumab Deruxtecan-nxki (Enhertu) (5.4) q21d     Malignant neoplasm metastatic to bone (HCC)  06/28/2021 Initial Diagnosis   Bone metastases (HCC)   09/22/2021 - 04/06/2022 Chemotherapy   Patient is on Treatment Plan : BREAST  Trastuzumab + Pertuzumab q21d      09/22/2021 - 09/21/2022 Chemotherapy   Patient is on Treatment Plan : BREAST Trastuzumab + Pertuzumab q21d     05/11/2022 Imaging   CT chest, abdomen and pelvis:  IMPRESSION:  1. Widespread bony metastatic disease shows increased sclerosis  since previous imaging. This is likely related to response to  therapy in the interval. There is a single lesion along the LEFT  iliac which shows continued lytic change and warrants attention on  follow-up  2. Interval development of pathologic fractures at in the upper  thoracic spine and at the thoracolumbar junction with associated  kyphosis.  3. No signs of solid organ or nodal metastatic disease.  4. Marked elevation of  the LEFT  hemidiaphragm as on the prior PET.  5. Aortic atherosclerosis.    10/12/2022 -  Chemotherapy   Patient is on Treatment Plan : BREAST METASTATIC Fam-Trastuzumab Deruxtecan-nxki (Enhertu) (5.4) q21d         REVIEW OF SYSTEMS:  Review of Systems  Constitutional: Negative.  Negative for appetite change, chills, diaphoresis, fatigue, fever and unexpected weight change.  HENT:  Negative.  Negative for hearing loss, lump/mass, mouth sores, nosebleeds, sore throat, tinnitus, trouble swallowing and voice change.   Eyes: Negative.  Negative for eye problems and icterus.  Respiratory: Negative.  Negative for chest tightness, cough, hemoptysis, shortness of breath and wheezing.   Cardiovascular: Negative.  Negative for chest pain, leg swelling and palpitations.  Gastrointestinal:  Positive for constipation, nausea (improved) and vomiting (occasional). Negative for abdominal distention, abdominal pain, blood in stool, diarrhea and rectal pain.  Endocrine: Negative.   Genitourinary: Negative.  Negative for bladder incontinence, difficulty urinating, dyspareunia, dysuria, frequency, hematuria, menstrual problem, nocturia, pelvic pain, vaginal bleeding and vaginal discharge.   Musculoskeletal:  Positive for back pain (lower back pain, 3/10). Negative for arthralgias, flank pain, gait problem, myalgias, neck pain and neck stiffness.  Skin:  Negative for itching, rash and wound.  Neurological: Negative.  Negative for dizziness, extremity weakness, gait problem, headaches, light-headedness, numbness, seizures and speech difficulty.  Hematological: Negative.  Negative for adenopathy. Does not bruise/bleed easily.  Psychiatric/Behavioral: Negative.  Negative for confusion, decreased concentration, depression, sleep disturbance and suicidal ideas. The patient is not nervous/anxious.    VITALS:  Blood pressure (!) 110/59, pulse 93, temperature 98.3 F (36.8 C), temperature source Oral, resp. rate 18,  height 4\' 8"  (1.422 m), weight 131 lb 14.4 oz (59.8 kg), last menstrual period 08/18/2013, SpO2 97%.  Wt Readings from Last 3 Encounters:  04/26/23 132 lb 1.3 oz (59.9 kg)  04/19/23 133 lb 1.3 oz (60.4 kg)  04/18/23 132 lb 12 oz (60.2 kg)    Body mass index is 29.57 kg/m.  Performance status (ECOG): 1 - Symptomatic but completely ambulatory  PHYSICAL EXAM:  Physical Exam Vitals and nursing note reviewed. Exam conducted with a chaperone present.  Constitutional:      General: She is not in acute distress.    Appearance: Normal appearance. She is normal weight. She is not ill-appearing, toxic-appearing or diaphoretic.  HENT:     Head: Normocephalic and atraumatic. Hair is abnormal (partial alopecia).     Right Ear: Tympanic membrane, ear canal and external ear normal. There is no impacted cerumen.     Left Ear: Tympanic membrane, ear canal and external ear normal. There is no impacted cerumen.     Nose: Nose normal. No congestion or rhinorrhea.     Mouth/Throat:     Mouth: Mucous membranes are moist.     Pharynx: Oropharynx is clear. No oropharyngeal exudate or posterior oropharyngeal erythema.  Eyes:     General: No scleral icterus.       Right eye: No discharge.        Left eye: No discharge.     Extraocular Movements: Extraocular movements intact.     Conjunctiva/sclera: Conjunctivae normal.     Pupils: Pupils are equal, round, and reactive to light.  Neck:     Vascular: No carotid bruit.     Comments: Chronic flexion of the neck. Cardiovascular:     Rate and Rhythm: Normal rate and regular rhythm.     Pulses: Normal pulses.     Heart sounds: Normal heart sounds. No  murmur heard.    No friction rub. No gallop.  Pulmonary:     Effort: Pulmonary effort is normal. No respiratory distress.     Breath sounds: Normal breath sounds. No stridor or decreased air movement. No wheezing, rhonchi or rales.  Chest:     Chest wall: No tenderness.     Comments: Well healed scar in the  upper outer of the left breast. Firmness diffusely in the left breast consistent with post radiation changes and the thickening of skin in both breast.  Abdominal:     General: Bowel sounds are normal. There is no distension.     Palpations: Abdomen is soft. There is no hepatomegaly, splenomegaly or mass.     Tenderness: There is no abdominal tenderness. There is no right CVA tenderness, left CVA tenderness, guarding or rebound.     Hernia: No hernia is present.  Musculoskeletal:        General: No swelling, tenderness, deformity or signs of injury. Normal range of motion.     Cervical back: Normal range of motion and neck supple. No rigidity or tenderness.     Right lower leg: No edema.     Left lower leg: No edema.     Comments: She has compression hoes in place and the open wounds are now healed  Lymphadenopathy:     Cervical: No cervical adenopathy.     Right cervical: No superficial, deep or posterior cervical adenopathy.    Left cervical: No superficial, deep or posterior cervical adenopathy.     Upper Body:     Right upper body: No supraclavicular, axillary or pectoral adenopathy.     Left upper body: No supraclavicular, axillary or pectoral adenopathy.  Skin:    General: Skin is warm and dry.     Coloration: Skin is not jaundiced or pale.     Findings: No bruising, erythema, lesion or rash.  Neurological:     General: No focal deficit present.     Mental Status: She is alert and oriented to person, place, and time. Mental status is at baseline.     Cranial Nerves: No cranial nerve deficit.     Sensory: No sensory deficit.     Motor: No weakness.     Coordination: Coordination normal.     Gait: Gait normal.     Deep Tendon Reflexes: Reflexes normal.  Psychiatric:        Mood and Affect: Mood normal.        Behavior: Behavior normal.        Thought Content: Thought content normal.        Judgment: Judgment normal.    LABORATORY DATA:  I have reviewed the data as  listed Component Ref Range & Units 4 d ago (04/12/23) 3 wk ago (03/25/23)  WBC Count 4.0 - 10.5 K/uL 4.6 5.7  RBC 3.87 - 5.11 MIL/uL 3.51 Low  3.69 Low   Hemoglobin 12.0 - 15.0 g/dL 29.5 Low  62.1 Low   HCT 36.0 - 46.0 % 33.5 Low  35.8 Low   MCV 80.0 - 100.0 fL 95.4 97.0  MCH 26.0 - 34.0 pg 29.1 30.1  MCHC 30.0 - 36.0 g/dL 30.8 65.7  RDW 84.6 - 96.2 % 17.4 High  16.6 High   Platelet Count 150 - 400 K/uL 343 315    Component Ref Range & Units 4 d ago (04/12/23) 3 wk ago (03/25/23)  Sodium 135 - 145 mmol/L 141 143  Potassium 3.5 - 5.1 mmol/L 3.1  Low  2.7 Low Panic  CM  Chloride 98 - 111 mmol/L 103 101  CO2 22 - 32 mmol/L 30 30  Glucose, Bld 70 - 99 mg/dL 696 High  295 High  CM  BUN 6 - 20 mg/dL 17 17  Creatinine 2.84 - 1.00 mg/dL 1.32 4.40  Calcium 8.9 - 10.3 mg/dL 8.7 Low  9.5  Total Protein 6.5 - 8.1 g/dL 5.8 Low  5.8 Low   Albumin 3.5 - 5.0 g/dL 3.1 Low  3.2 Low   AST 15 - 41 U/L 15 17  ALT 0 - 44 U/L 18 14  Alkaline Phosphatase 38 - 126 U/L 60 55  Total Bilirubin 0.3 - 1.2 mg/dL 0.4 0.2 Low   GFR, Estimated >60 mL/min >60      Component Ref Range & Units 4 d ago (04/12/23) 3 wk ago (03/25/23) 2 mo ago (02/12/23) 2 mo ago (02/01/23) 2 mo ago (01/22/23) 3 mo ago (01/14/23) 3 mo ago (01/09/23)  Magnesium 1.7 - 2.4 mg/dL 1.9 1.6 Low  CM 1.9 CM 1.90 R 1.7 CM 2.0 CM 1.8 CM        Component Value Date/Time   NA 141 04/12/2023 1333   NA 138 03/15/2023 0000   K 3.1 (L) 04/12/2023 1333   CL 103 04/12/2023 1333   CO2 30 04/12/2023 1333   GLUCOSE 128 (H) 04/12/2023 1333   BUN 17 04/12/2023 1333   BUN 13 03/15/2023 0000   CREATININE 0.45 04/12/2023 1333   CALCIUM 8.7 (L) 04/12/2023 1333   PROT 5.8 (L) 04/12/2023 1333   ALBUMIN 3.1 (L) 04/12/2023 1333   AST 15 04/12/2023 1333   ALT 18 04/12/2023 1333   ALKPHOS 60 04/12/2023 1333   BILITOT 0.4 04/12/2023 1333   GFRNONAA >60 04/12/2023 1333   No results found for: "SPEP", "UPEP"  Lab Results   Component Value Date   WBC 4.6 04/12/2023   NEUTROABS 3.4 04/12/2023   HGB 10.2 (L) 04/12/2023   HCT 33.5 (L) 04/12/2023   MCV 95.4 04/12/2023   PLT 343 04/12/2023     Chemistry      Component Value Date/Time   NA 141 04/12/2023 1333   NA 138 03/15/2023 0000   K 3.1 (L) 04/12/2023 1333   CL 103 04/12/2023 1333   CO2 30 04/12/2023 1333   BUN 17 04/12/2023 1333   BUN 13 03/15/2023 0000   CREATININE 0.45 04/12/2023 1333   GLU 180 03/15/2023 0000      Component Value Date/Time   CALCIUM 8.7 (L) 04/12/2023 1333   ALKPHOS 60 04/12/2023 1333   AST 15 04/12/2023 1333   ALT 18 04/12/2023 1333   BILITOT 0.4 04/12/2023 1333     Component Ref Range & Units 2 wk ago (11/21/22) 2 mo ago (10/09/22) 3 mo ago (08/29/22) 5 mo ago (06/28/22) 1 yr ago (09/06/21) 1 yr ago (07/20/21)  CA 27.29 0.0 - 38.6 U/mL 46.2 High  53.8 High  CM 57.7 High  CM 50.9 High  CM 106.6 High  CM 75.6 High  C            Component Ref Range & Units 2 wk ago (03/25/23) 1 mo ago (02/12/23) 2 mo ago (02/01/23) 2 mo ago (01/22/23) 2 mo ago (01/14/23) 3 mo ago (01/09/23) 3 mo ago (01/03/23)  Magnesium 1.7 - 2.4 mg/dL 1.6 Low  1.9 CM 1.02 R 1.7 CM 2.0 CM 1.8 CM 1.6 Low  CM    RADIOGRAPHIC STUDIES: EXAM: 03/01/2023 CT CHEST,  ABDOMEN AND PELVIS IMPRESSION: Unchanged mass in the lateral left breast measuring 1.3 x 1.1 cm and overlying skin thickening of the left breast  Diminished soft tissue thickening about the left subclavian structures and thoracic inlet, presumably reflecting treatment response of infiltrative soft tissue metastatic disease.  Unchanged, widespread mixed lytic and sclerotic osseous metastatic disease throughout, partially notable for pathologic wedge deformities of T12 and l1 status post vertebral cement augmentation, as well as untreated pathologic wedge deformities of T3 and T4, with multiple additional pathologic endplate deformities.  No evidence of lymphadenopathy or organ metastatic disease in  the chest, abdomen or pelvis.  Unchanged, severe elevation of the left hemidiaphragm with associated scarring or atelectasis  Coronary artery disease.     Echocardiogram: 10/18/2022 IMPRESSIONS:  1. TDS, decrease LVEF as compared to echo from 07/16/2022. Left  ventricular ejection fraction, by estimation, is 50 to 55%. The left  ventricle has low normal function. The left ventricle has no regional wall  motion abnormalities. Left ventricular  diastolic parameters are consistent with Grade I diastolic dysfunction  (impaired relaxation).   2. Right ventricular systolic function is normal. The right ventricular  size is normal. There is normal pulmonary artery systolic pressure.   3. The mitral valve is normal in structure. No evidence of mitral valve  regurgitation. No evidence of mitral stenosis.   4. The aortic valve is normal in structure. Aortic valve regurgitation is  not visualized. No aortic stenosis is present.   5. The inferior vena cava is normal in size with greater than 50%  respiratory variability, suggesting right atrial pressure of 3 mmHg.         I,Jasmine M Lassiter,acting as a scribe for Dellia Beckwith, MD.,have documented all relevant documentation on the behalf of Dellia Beckwith, MD,as directed by  Dellia Beckwith, MD while in the presence of Dellia Beckwith, MD.

## 2023-04-12 ENCOUNTER — Inpatient Hospital Stay: Payer: BC Managed Care – PPO

## 2023-04-12 DIAGNOSIS — C7951 Secondary malignant neoplasm of bone: Secondary | ICD-10-CM

## 2023-04-12 DIAGNOSIS — C50412 Malignant neoplasm of upper-outer quadrant of left female breast: Secondary | ICD-10-CM

## 2023-04-12 DIAGNOSIS — Z5111 Encounter for antineoplastic chemotherapy: Secondary | ICD-10-CM | POA: Diagnosis not present

## 2023-04-12 LAB — CBC WITH DIFFERENTIAL (CANCER CENTER ONLY)
Abs Immature Granulocytes: 0.01 10*3/uL (ref 0.00–0.07)
Basophils Absolute: 0 10*3/uL (ref 0.0–0.1)
Basophils Relative: 1 %
Eosinophils Absolute: 0.1 10*3/uL (ref 0.0–0.5)
Eosinophils Relative: 3 %
HCT: 33.5 % — ABNORMAL LOW (ref 36.0–46.0)
Hemoglobin: 10.2 g/dL — ABNORMAL LOW (ref 12.0–15.0)
Immature Granulocytes: 0 %
Lymphocytes Relative: 12 %
Lymphs Abs: 0.5 10*3/uL — ABNORMAL LOW (ref 0.7–4.0)
MCH: 29.1 pg (ref 26.0–34.0)
MCHC: 30.4 g/dL (ref 30.0–36.0)
MCV: 95.4 fL (ref 80.0–100.0)
Monocytes Absolute: 0.5 10*3/uL (ref 0.1–1.0)
Monocytes Relative: 11 %
Neutro Abs: 3.4 10*3/uL (ref 1.7–7.7)
Neutrophils Relative %: 73 %
Platelet Count: 343 10*3/uL (ref 150–400)
RBC: 3.51 MIL/uL — ABNORMAL LOW (ref 3.87–5.11)
RDW: 17.4 % — ABNORMAL HIGH (ref 11.5–15.5)
WBC Count: 4.6 10*3/uL (ref 4.0–10.5)
nRBC: 0 % (ref 0.0–0.2)

## 2023-04-12 LAB — CMP (CANCER CENTER ONLY)
ALT: 18 U/L (ref 0–44)
AST: 15 U/L (ref 15–41)
Albumin: 3.1 g/dL — ABNORMAL LOW (ref 3.5–5.0)
Alkaline Phosphatase: 60 U/L (ref 38–126)
Anion gap: 8 (ref 5–15)
BUN: 17 mg/dL (ref 6–20)
CO2: 30 mmol/L (ref 22–32)
Calcium: 8.7 mg/dL — ABNORMAL LOW (ref 8.9–10.3)
Chloride: 103 mmol/L (ref 98–111)
Creatinine: 0.45 mg/dL (ref 0.44–1.00)
GFR, Estimated: >60 mL/min (ref >60)
Glucose, Bld: 128 mg/dL — ABNORMAL HIGH (ref 70–99)
Potassium: 3.1 mmol/L — ABNORMAL LOW (ref 3.5–5.1)
Sodium: 141 mmol/L (ref 135–145)
Total Bilirubin: 0.4 mg/dL (ref 0.3–1.2)
Total Protein: 5.8 g/dL — ABNORMAL LOW (ref 6.5–8.1)

## 2023-04-12 LAB — MAGNESIUM: Magnesium: 1.9 mg/dL (ref 1.7–2.4)

## 2023-04-16 ENCOUNTER — Encounter: Payer: Self-pay | Admitting: Oncology

## 2023-04-16 ENCOUNTER — Other Ambulatory Visit: Payer: Self-pay | Admitting: Oncology

## 2023-04-16 ENCOUNTER — Inpatient Hospital Stay: Payer: BC Managed Care – PPO | Attending: Hematology and Oncology | Admitting: Oncology

## 2023-04-16 ENCOUNTER — Other Ambulatory Visit: Payer: Self-pay

## 2023-04-16 VITALS — BP 110/59 | HR 93 | Temp 98.3°F | Resp 18 | Ht <= 58 in | Wt 131.9 lb

## 2023-04-16 DIAGNOSIS — Z5112 Encounter for antineoplastic immunotherapy: Secondary | ICD-10-CM | POA: Insufficient documentation

## 2023-04-16 DIAGNOSIS — C7951 Secondary malignant neoplasm of bone: Secondary | ICD-10-CM

## 2023-04-16 DIAGNOSIS — C50412 Malignant neoplasm of upper-outer quadrant of left female breast: Secondary | ICD-10-CM | POA: Insufficient documentation

## 2023-04-16 DIAGNOSIS — Z23 Encounter for immunization: Secondary | ICD-10-CM | POA: Insufficient documentation

## 2023-04-16 DIAGNOSIS — Z170 Estrogen receptor positive status [ER+]: Secondary | ICD-10-CM | POA: Diagnosis not present

## 2023-04-16 DIAGNOSIS — K219 Gastro-esophageal reflux disease without esophagitis: Secondary | ICD-10-CM

## 2023-04-16 DIAGNOSIS — Z5111 Encounter for antineoplastic chemotherapy: Secondary | ICD-10-CM | POA: Insufficient documentation

## 2023-04-16 DIAGNOSIS — E876 Hypokalemia: Secondary | ICD-10-CM | POA: Insufficient documentation

## 2023-04-16 MED ORDER — OMEPRAZOLE 40 MG PO CPDR: 40.0000 mg | DELAYED_RELEASE_CAPSULE | Freq: Every day | ORAL | 5 refills | Status: DC

## 2023-04-17 ENCOUNTER — Other Ambulatory Visit: Payer: Self-pay

## 2023-04-17 ENCOUNTER — Encounter: Payer: Self-pay | Admitting: Oncology

## 2023-04-17 NOTE — Addendum Note (Signed)
Addended by: Domenic Schwab on: 04/17/2023 05:26 PM   Modules accepted: Orders

## 2023-04-17 NOTE — Addendum Note (Signed)
Addended by: Domenic Schwab on: 04/17/2023 05:41 PM   Modules accepted: Orders

## 2023-04-18 ENCOUNTER — Inpatient Hospital Stay: Payer: BC Managed Care – PPO

## 2023-04-18 ENCOUNTER — Other Ambulatory Visit: Payer: Self-pay | Admitting: Oncology

## 2023-04-18 VITALS — BP 114/83 | HR 89 | Temp 98.6°F | Resp 18 | Ht <= 58 in | Wt 132.8 lb

## 2023-04-18 DIAGNOSIS — C7951 Secondary malignant neoplasm of bone: Secondary | ICD-10-CM | POA: Diagnosis not present

## 2023-04-18 DIAGNOSIS — Z006 Encounter for examination for normal comparison and control in clinical research program: Secondary | ICD-10-CM

## 2023-04-18 DIAGNOSIS — C50412 Malignant neoplasm of upper-outer quadrant of left female breast: Secondary | ICD-10-CM | POA: Diagnosis not present

## 2023-04-18 DIAGNOSIS — Z5112 Encounter for antineoplastic immunotherapy: Secondary | ICD-10-CM | POA: Diagnosis not present

## 2023-04-18 DIAGNOSIS — E876 Hypokalemia: Secondary | ICD-10-CM | POA: Diagnosis not present

## 2023-04-18 DIAGNOSIS — Z170 Estrogen receptor positive status [ER+]: Secondary | ICD-10-CM | POA: Diagnosis not present

## 2023-04-18 DIAGNOSIS — Z23 Encounter for immunization: Secondary | ICD-10-CM | POA: Diagnosis not present

## 2023-04-18 DIAGNOSIS — Z5111 Encounter for antineoplastic chemotherapy: Secondary | ICD-10-CM | POA: Diagnosis present

## 2023-04-18 MED ORDER — POTASSIUM CHLORIDE 10 MEQ/100ML IV SOLN
10.0000 meq | INTRAVENOUS | Status: AC
  Administered 2023-04-18 (×3): 10 meq via INTRAVENOUS
  Filled 2023-04-18 (×3): qty 100

## 2023-04-18 MED ORDER — HEPARIN SOD (PORK) LOCK FLUSH 100 UNIT/ML IV SOLN
500.0000 [IU] | Freq: Once | INTRAVENOUS | Status: AC | PRN
  Administered 2023-04-18: 500 [IU]

## 2023-04-18 MED ORDER — SODIUM CHLORIDE 0.9 % IV SOLN: Freq: Once | INTRAVENOUS | Status: AC

## 2023-04-18 MED ORDER — INFLUENZA VIRUS VACC SPLIT PF (FLUZONE) 0.5 ML IM SUSY
0.5000 mL | PREFILLED_SYRINGE | Freq: Once | INTRAMUSCULAR | Status: AC
  Administered 2023-04-18: 0.5 mL via INTRAMUSCULAR
  Filled 2023-04-18: qty 0.5

## 2023-04-18 MED ORDER — SODIUM CHLORIDE 0.9% FLUSH
10.0000 mL | Freq: Once | INTRAVENOUS | Status: AC | PRN
  Administered 2023-04-18: 10 mL

## 2023-04-18 MED FILL — Fosaprepitant Dimeglumine For IV Infusion 150 MG (Base Eq): INTRAVENOUS | Qty: 5 | Status: AC

## 2023-04-18 MED FILL — Dexamethasone Sodium Phosphate Inj 100 MG/10ML: INTRAMUSCULAR | Qty: 1 | Status: AC

## 2023-04-18 NOTE — Patient Instructions (Signed)
Hypokalemia Hypokalemia means that the amount of potassium in the blood is lower than normal. Potassium is a mineral (electrolyte) that helps regulate the amount of fluid in the body. It also stimulates muscle tightening (contraction) and helps nerves work properly. Normally, most of the body's potassium is inside cells, and only a very small amount is in the blood. Because the amount in the blood is so small, minor changes to potassium levels in the blood can be life-threatening. What are the causes? This condition may be caused by: Antibiotic medicine. Diarrhea or vomiting. Taking too much of a medicine that helps you have a bowel movement (laxative) can cause diarrhea and lead to hypokalemia. Chronic kidney disease (CKD). Medicines that help the body get rid of excess fluid (diuretics). Eating disorders, such as anorexia or bulimia. Low magnesium levels in the body. Sweating a lot. What are the signs or symptoms? Symptoms of this condition include: Weakness. Constipation. Fatigue. Muscle cramps. Mental confusion. Skipped heartbeats or irregular heartbeat (palpitations). Tingling or numbness. How is this diagnosed? This condition is diagnosed with a blood test. How is this treated? This condition may be treated by: Taking potassium supplements. Adjusting the medicines that you take. Eating more foods that contain a lot of potassium. If your potassium level is very low, you may need to get potassium through an IV and be monitored in the hospital. Follow these instructions at home: Eating and drinking  Eat a healthy diet. A healthy diet includes fresh fruits and vegetables, whole grains, healthy fats, and lean proteins. If told, eat more foods that contain a lot of potassium. These include: Nuts, such as peanuts and pistachios. Seeds, such as sunflower seeds and pumpkin seeds. Peas, lentils, and lima beans. Whole grain and bran cereals and breads. Fresh fruits and vegetables,  such as apricots, avocado, bananas, cantaloupe, kiwi, oranges, tomatoes, asparagus, and potatoes. Juices, such as orange, tomato, and prune. Lean meats, including fish. Milk and milk products, such as yogurt. General instructions Take over-the-counter and prescription medicines only as told by your health care provider. This includes vitamins, natural food products, and supplements. Keep all follow-up visits. This is important. Contact a health care provider if: You have weakness that gets worse. You feel your heart pounding or racing. You vomit. You have diarrhea. You have diabetes and you have trouble keeping your blood sugar in your target range. Get help right away if: You have chest pain. You have shortness of breath. You have vomiting or diarrhea that lasts for more than 2 days. You faint. These symptoms may be an emergency. Get help right away. Call 911. Do not wait to see if the symptoms will go away. Do not drive yourself to the hospital. Summary Hypokalemia means that the amount of potassium in the blood is lower than normal. This condition is diagnosed with a blood test. Hypokalemia may be treated by taking potassium supplements, adjusting the medicines that you take, or eating more foods that are high in potassium. If your potassium level is very low, you may need to get potassium through an IV and be monitored in the hospital. This information is not intended to replace advice given to you by your health care provider. Make sure you discuss any questions you have with your health care provider. Document Revised: 04/13/2021 Document Reviewed: 04/13/2021 Elsevier Patient Education  2024 Elsevier Inc.  

## 2023-04-19 ENCOUNTER — Inpatient Hospital Stay: Payer: BC Managed Care – PPO

## 2023-04-19 VITALS — BP 99/70 | HR 99 | Temp 99.0°F | Resp 18 | Ht <= 58 in | Wt 133.1 lb

## 2023-04-19 DIAGNOSIS — Z5111 Encounter for antineoplastic chemotherapy: Secondary | ICD-10-CM | POA: Diagnosis not present

## 2023-04-19 DIAGNOSIS — Z170 Estrogen receptor positive status [ER+]: Secondary | ICD-10-CM

## 2023-04-19 DIAGNOSIS — C7951 Secondary malignant neoplasm of bone: Secondary | ICD-10-CM

## 2023-04-19 MED ORDER — DEXTROSE 5 % IV SOLN: Freq: Once | INTRAVENOUS | Status: AC

## 2023-04-19 MED ORDER — ACETAMINOPHEN 325 MG PO TABS: 650.0000 mg | ORAL_TABLET | Freq: Once | ORAL | Status: DC

## 2023-04-19 MED ORDER — PALONOSETRON HCL INJECTION 0.25 MG/5ML
0.2500 mg | Freq: Once | INTRAVENOUS | Status: AC
  Administered 2023-04-19: 0.25 mg via INTRAVENOUS
  Filled 2023-04-19: qty 5

## 2023-04-19 MED ORDER — SODIUM CHLORIDE 0.9 % IV SOLN
10.0000 mg | Freq: Once | INTRAVENOUS | Status: AC
  Administered 2023-04-19: 10 mg via INTRAVENOUS
  Filled 2023-04-19: qty 10

## 2023-04-19 MED ORDER — SODIUM CHLORIDE 0.9% FLUSH
10.0000 mL | INTRAVENOUS | Status: DC | PRN
  Administered 2023-04-19: 10 mL

## 2023-04-19 MED ORDER — DIPHENHYDRAMINE HCL 25 MG PO CAPS: 50.0000 mg | ORAL_CAPSULE | Freq: Once | ORAL | Status: DC

## 2023-04-19 MED ORDER — SODIUM CHLORIDE 0.9 % IV SOLN
150.0000 mg | Freq: Once | INTRAVENOUS | Status: AC
  Administered 2023-04-19: 150 mg via INTRAVENOUS
  Filled 2023-04-19: qty 150

## 2023-04-19 MED ORDER — FAM-TRASTUZUMAB DERUXTECAN-NXKI CHEMO 100 MG IV SOLR
5.4000 mg/kg | Freq: Once | INTRAVENOUS | Status: AC
  Administered 2023-04-19: 340 mg via INTRAVENOUS
  Filled 2023-04-19: qty 17

## 2023-04-19 MED ORDER — DENOSUMAB 120 MG/1.7ML ~~LOC~~ SOLN
120.0000 mg | Freq: Once | SUBCUTANEOUS | Status: AC
  Administered 2023-04-19: 120 mg via SUBCUTANEOUS
  Filled 2023-04-19: qty 1.7

## 2023-04-19 MED ORDER — HEPARIN SOD (PORK) LOCK FLUSH 100 UNIT/ML IV SOLN
500.0000 [IU] | Freq: Once | INTRAVENOUS | Status: AC | PRN
  Administered 2023-04-19: 500 [IU]

## 2023-04-19 NOTE — Patient Instructions (Signed)
[facility] [facility]  Discharge Instructions: Thank you for choosing [facility] to provide your oncology and hematology care.  If you have a lab appointment with the Cancer Center, please go directly to the Cancer Center and check in at the registration area.   Wear comfortable clothing and clothing appropriate for easy access to any Portacath or PICC line.   We strive to give you quality time with your provider. You may need to reschedule your appointment if you arrive late (15 or more minutes).  Arriving late affects you and other patients whose appointments are after yours.  Also, if you miss three or more appointments without notifying the office, you may be dismissed from the clinic at the provider's discretion.      For prescription refill requests, have your pharmacy contact our office and allow 72 hours for refills to be completed.    Today you received the following chemotherapy and/or immunotherapy agents Enhertu & Rivka Barbara       To help prevent nausea and vomiting after your treatment, we encourage you to take your nausea medication as directed.  BELOW ARE SYMPTOMS THAT SHOULD BE REPORTED IMMEDIATELY: *FEVER GREATER THAN 100.4 F (38 C) OR HIGHER *CHILLS OR SWEATING *NAUSEA AND VOMITING THAT IS NOT CONTROLLED WITH YOUR NAUSEA MEDICATION *UNUSUAL SHORTNESS OF BREATH *UNUSUAL BRUISING OR BLEEDING *URINARY PROBLEMS (pain or burning when urinating, or frequent urination) *BOWEL PROBLEMS (unusual diarrhea, constipation, pain near the anus) TENDERNESS IN MOUTH AND THROAT WITH OR WITHOUT PRESENCE OF ULCERS (sore throat, sores in mouth, or a toothache) UNUSUAL RASH, SWELLING OR PAIN  UNUSUAL VAGINAL DISCHARGE OR ITCHING   Items with * indicate a potential emergency and should be followed up as soon as possible or go to the Emergency Department if any problems should occur.  Please show the CHEMOTHERAPY ALERT CARD or IMMUNOTHERAPY ALERT CARD at check-in to  the Emergency Department and triage nurse.  Should you have questions after your visit or need to cancel or reschedule your appointment, please contact [facility]  Dept: [phone]  and follow the prompts.  Office hours are 8:00 a.m. to 4:30 p.m. Monday - Friday. Please note that voicemails left after 4:00 p.m. may not be returned until the following business day.  We are closed weekends and major holidays. You have access to a nurse at all times for urgent questions. Please call the main number to the clinic Dept: [phone] and follow the prompts.  For any non-urgent questions, you may also contact your provider using MyChart. We now offer e-Visits for anyone 52 and older to request care online for non-urgent symptoms. For details visit [email or web address].   Also download the MyChart app! Go to the app store, search "MyChart", open the app, select [place], and log in with your MyChart username and password.

## 2023-04-22 ENCOUNTER — Other Ambulatory Visit: Payer: Self-pay | Admitting: Cardiology

## 2023-04-26 ENCOUNTER — Inpatient Hospital Stay: Payer: BC Managed Care – PPO

## 2023-04-26 VITALS — BP 103/74 | HR 102 | Temp 98.9°F | Resp 18 | Ht <= 58 in | Wt 132.1 lb

## 2023-04-26 DIAGNOSIS — Z5111 Encounter for antineoplastic chemotherapy: Secondary | ICD-10-CM | POA: Diagnosis not present

## 2023-04-26 DIAGNOSIS — C7951 Secondary malignant neoplasm of bone: Secondary | ICD-10-CM

## 2023-04-26 MED ORDER — FULVESTRANT 250 MG/5ML IM SOSY
500.0000 mg | PREFILLED_SYRINGE | Freq: Once | INTRAMUSCULAR | Status: AC
  Administered 2023-04-26: 500 mg via INTRAMUSCULAR

## 2023-04-26 NOTE — Patient Instructions (Signed)

## 2023-05-03 ENCOUNTER — Encounter: Payer: Self-pay | Admitting: Oncology

## 2023-05-06 ENCOUNTER — Encounter: Payer: Self-pay | Admitting: Cardiology

## 2023-05-06 ENCOUNTER — Ambulatory Visit: Payer: BC Managed Care – PPO | Attending: Cardiology | Admitting: Cardiology

## 2023-05-06 ENCOUNTER — Inpatient Hospital Stay: Payer: BC Managed Care – PPO

## 2023-05-06 VITALS — BP 104/78 | HR 95 | Ht <= 58 in | Wt 131.8 lb

## 2023-05-06 DIAGNOSIS — E669 Obesity, unspecified: Secondary | ICD-10-CM

## 2023-05-06 DIAGNOSIS — Z170 Estrogen receptor positive status [ER+]: Secondary | ICD-10-CM

## 2023-05-06 DIAGNOSIS — I10 Essential (primary) hypertension: Secondary | ICD-10-CM | POA: Diagnosis not present

## 2023-05-06 DIAGNOSIS — E119 Type 2 diabetes mellitus without complications: Secondary | ICD-10-CM

## 2023-05-06 DIAGNOSIS — C7951 Secondary malignant neoplasm of bone: Secondary | ICD-10-CM

## 2023-05-06 DIAGNOSIS — K7581 Nonalcoholic steatohepatitis (NASH): Secondary | ICD-10-CM

## 2023-05-06 DIAGNOSIS — Z5111 Encounter for antineoplastic chemotherapy: Secondary | ICD-10-CM | POA: Diagnosis not present

## 2023-05-06 LAB — CMP (CANCER CENTER ONLY)
ALT: 19 U/L (ref 0–44)
AST: 18 U/L (ref 15–41)
Albumin: 3.4 g/dL — ABNORMAL LOW (ref 3.5–5.0)
Alkaline Phosphatase: 63 U/L (ref 38–126)
Anion gap: 7 (ref 5–15)
BUN: 15 mg/dL (ref 6–20)
CO2: 29 mmol/L (ref 22–32)
Calcium: 8.7 mg/dL — ABNORMAL LOW (ref 8.9–10.3)
Chloride: 101 mmol/L (ref 98–111)
Creatinine: 0.62 mg/dL (ref 0.44–1.00)
GFR, Estimated: >60 mL/min (ref >60)
Glucose, Bld: 103 mg/dL — ABNORMAL HIGH (ref 70–99)
Potassium: 3.5 mmol/L (ref 3.5–5.1)
Sodium: 137 mmol/L (ref 135–145)
Total Bilirubin: 0.4 mg/dL (ref 0.3–1.2)
Total Protein: 6.1 g/dL — ABNORMAL LOW (ref 6.5–8.1)

## 2023-05-06 LAB — CBC WITH DIFFERENTIAL (CANCER CENTER ONLY)
Abs Immature Granulocytes: 0.01 10*3/uL (ref 0.00–0.07)
Basophils Absolute: 0 10*3/uL (ref 0.0–0.1)
Basophils Relative: 1 %
Eosinophils Absolute: 0.2 10*3/uL (ref 0.0–0.5)
Eosinophils Relative: 5 %
HCT: 36.9 % (ref 36.0–46.0)
Hemoglobin: 11.3 g/dL — ABNORMAL LOW (ref 12.0–15.0)
Immature Granulocytes: 0 %
Lymphocytes Relative: 15 %
Lymphs Abs: 0.7 10*3/uL (ref 0.7–4.0)
MCH: 29.6 pg (ref 26.0–34.0)
MCHC: 30.6 g/dL (ref 30.0–36.0)
MCV: 96.6 fL (ref 80.0–100.0)
Monocytes Absolute: 0.6 10*3/uL (ref 0.1–1.0)
Monocytes Relative: 14 %
Neutro Abs: 2.9 10*3/uL (ref 1.7–7.7)
Neutrophils Relative %: 65 %
Platelet Count: 312 10*3/uL (ref 150–400)
RBC: 3.82 MIL/uL — ABNORMAL LOW (ref 3.87–5.11)
RDW: 17.8 % — ABNORMAL HIGH (ref 11.5–15.5)
WBC Count: 4.4 10*3/uL (ref 4.0–10.5)
nRBC: 0 % (ref 0.0–0.2)

## 2023-05-06 LAB — MAGNESIUM: Magnesium: 2 mg/dL (ref 1.7–2.4)

## 2023-05-06 MED ORDER — LABETALOL HCL 100 MG PO TABS: 100.0000 mg | ORAL_TABLET | Freq: Two times a day (BID) | ORAL | 3 refills | Status: DC

## 2023-05-06 NOTE — Patient Instructions (Addendum)
Medication Instructions:  Your physician has recommended you make the following change in your medication:  DECREASE: Labetalol (Normodyne) 100 mg twice daily *If you need a refill on your cardiac medications before your next appointment, please call your pharmacy*   Lab Work: None   Testing/Procedures: None   Follow-Up: At [facility], you and your health needs are our priority.  As part of our continuing mission to provide you with exceptional heart care, we have created designated Provider Care Teams.  These Care Teams include your primary Cardiologist (physician) and Advanced Practice Providers (APPs -  Physician Assistants and Nurse Practitioners) who all work together to provide you with the care you need, when you need it.     Your next appointment:   6 month(s)  Provider:   Thomasene Ripple, DO    Other instructions: Please notify Dr. Servando Salina in 3 months about our blood pressure and heart rate.

## 2023-05-06 NOTE — Progress Notes (Signed)
Cardiology Office Note:    Date:  05/06/2023   ID:  Anna [name], DOB 1970/10/10, MRN [id number]  PCP:  Galvin Proffer, MD  Cardiologist:  Thomasene Ripple, DO  Electrophysiologist:  None   Referring MD: Galvin Proffer, MD   " I am ok"   History of Present Illness:    Anna [name] is a 52 y.o. 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   She has been undergoing chemotherapy for almost a year, with treatments every three weeks. She report feeling better since stopping valsartan due to low blood pressure, tiredness, and lightheadedness. She is currently on labetalol, which she has been taking for a long time, possibly since after having children. She is also is also on Lipitor.  The patient reports shortness of breath with activity but denies any other symptoms. She has not been doing any any exercise recently. She also mention that their potassium has been low for a while, requiring supplementation. She is due for blood work at the cancer center later in the day.  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/2023   Bipolar disorder (HCC) 11/29/2020   Breast cancer (HCC)    Cancer (HCC)    Cardiac murmur    Chest pain    Chronic fatigue syndrome 11/29/2020   Depression    Essential hypertension 11/29/2020   Hepatic steatosis determined by biopsy of liver 07/04/2020   HLD (hyperlipidemia)    Hypertension    Hypomagnesemia 03/26/2023   Hypothyroidism 11/29/2020   Insomnia 11/29/2020   Iron deficiency anemia 06/05/2022   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   Migraine 11/29/2020   Mild  intermittent asthma 11/29/2020   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   Mixed hyperlipidemia 11/29/2020   Obesity due to excess calories    Osteopenia after menopause 07/04/2020   Other long term (current) drug therapy 11/29/2020   Other vitamin B12 deficiency anemias 11/29/2020   Personal history of malignant neoplasm of breast 11/29/2020   Pre-diabetes    Renal cyst, acquired, right 07/04/2020   14.5 cm in October 2021   Retrocalcaneal bursitis (back of heel), left 04/22/2018   Seasonal allergic rhinitis 11/29/2020   Tightness of heel cord, left 04/22/2018   Type 2 diabetes mellitus without complications (HCC) 11/29/2020   Vitamin D deficiency 11/29/2020    Past Surgical History:  Procedure Laterality Date   CESAREAN SECTION     IR BONE TUMOR(S)RF ABLATION  05/29/2022   IR BONE TUMOR(S)RF ABLATION  05/29/2022   IR KYPHO EA ADDL LEVEL THORACIC OR LUMBAR  05/29/2022   IR KYPHO THORACIC WITH BONE BIOPSY  05/29/2022   IR RADIOLOGIST EVAL & MGMT  05/22/2022   IR RADIOLOGIST EVAL & MGMT  06/12/2022   LIVER BIOPSY  01/2019   lumpectomy  2014   Left Breast   PORT-A-CATH REMOVAL     PORTACATH PLACEMENT  08/28/2013   STOMACH SURGERY     Tummy Tuck   TOTAL HIP ARTHROPLASTY Right 07/04/2021   Procedure: TOTAL HIP ARTHROPLASTY;  Surgeon: Durene Romans, MD;  Location: [facility];  Service: Orthopedics;  Laterality: Right;  90   WISDOM TOOTH  EXTRACTION      Current Medications: Current Meds  Medication Sig   albuterol (VENTOLIN HFA) 108 (90 Base) MCG/ACT inhaler Inhale 2 puffs into the lungs every 6 (six) hours as needed for shortness of breath.   ARIPiprazole (ABILIFY) 5 MG tablet Take 2.5 mg by mouth daily.   aspirin 81 MG EC tablet Take 81 mg by mouth daily. Swallow whole.   atorvastatin (LIPITOR) 20 MG tablet TAKE ONE TABLET BY MOUTH EVERY DAY   augmented betamethasone dipropionate (DIPROLENE-AF) 0.05 % cream    dapagliflozin propanediol (FARXIGA) 10 MG TABS tablet TAKE  ONE TABLET BY MOUTH EVERY DAY   desvenlafaxine (PRISTIQ) 50 MG 24 hr tablet Take 50 mg by mouth daily.   dexamethasone (DECADRON) 4 MG tablet Take 1 tablet (4 mg total) by mouth 2 (two) times daily. For 5 days after chemotherapy   diphenoxylate-atropine (LOMOTIL) 2.5-0.025 MG tablet TAKE TWO TABLETS BY MOUTH FOUR TIMES A DAY AS NEEDED FOR DIARRHEA OR LOOSE STOOLS   labetalol (NORMODYNE) 100 MG tablet Take 1 tablet (100 mg total) by mouth 2 (two) times daily.   loratadine (CLARITIN) 10 MG tablet TAKE ONE TABLET BY MOUTH DAILY   LORazepam (ATIVAN) 1 MG tablet TAKE ONE TABLET BY MOUTH EVERY 8 HOURS   magic mouthwash (lidocaine, diphenhydrAMINE, alum & mag hydroxide) suspension Take 5 mLs by mouth every 3 (three) hours as needed. Swish and spit; for throat/mouth pain   naloxone (NARCAN) nasal spray 4 mg/0.1 mL One spray in nostril as needed, may repeat every 2 to 3 minutes until medical assistance becomes available   nitroGLYCERIN (NITROSTAT) 0.4 MG SL tablet Place 0.4 mg under the tongue every 5 (five) minutes as needed for chest pain.   nystatin (MYCOSTATIN/NYSTOP) powder Apply 1 Application topically 2 (two) times daily.   omeprazole (PRILOSEC) 40 MG capsule Take 1 capsule (40 mg total) by mouth at bedtime.   ondansetron (ZOFRAN) 8 MG tablet Take 1 tablet (8 mg total) by mouth every 8 (eight) hours as needed for nausea or vomiting.   Oxycodone HCl 10 MG TABS TAKE ONE TABLET BY MOUTH EVERY 4 HOURS AS NEEDED FOR PAIN   Potassium Chloride 10 % SOLN Take 20 mEq by mouth 2 (two) times daily.   prochlorperazine (COMPAZINE) 10 MG tablet TAKE ONE TABLET BY MOUTH EVERY 6 HOURS AS NEEDED FOR NAUSEA/VOMITING   [DISCONTINUED] labetalol (NORMODYNE) 200 MG tablet Take 1 tablet (200 mg total) by mouth 2 (two) times daily.   Current Facility-Administered Medications for the 05/06/23 encounter (Office Visit) with Thomasene Ripple, DO  Medication   0.9 %  sodium chloride infusion   technetium sestamibi generic  (CARDIOLITE) injection 10.5 millicurie     Allergies:   Reglan [metoclopramide]   Social History   Socioeconomic History   Marital status: Married    Spouse name: Not on file   Number of children: Not on file   Years of education: Not on file   Highest education level: Not on file  Occupational History   Not on file  Tobacco Use   Smoking status: Former    Current packs/day: 0.00    Types: Cigarettes    Quit date: 2004    Years since quitting: 20.7   Smokeless tobacco: Never  Vaping Use   Vaping status: Never Used  Substance and Sexual Activity   Alcohol use: Yes    Comment: occasionally   Drug use: Never   Sexual activity: Not on file  Other Topics Concern  Not on file  Social History Narrative   Not on file   Social Determinants of Health   Financial Resource Strain: Not on file  Food Insecurity: Not on file  Transportation Needs: Not on file  Physical Activity: Not on file  Stress: Not on file  Social Connections: Not on file     Family History: The patient's family history includes Diabetes in her father; Hyperlipidemia in her mother; Stroke in her father. There is no history of Colon cancer, Colon polyps, Esophageal cancer, Rectal cancer, or Stomach cancer.  ROS:   Review of Systems  Constitution: Negative for decreased appetite, fever and weight gain.  HENT: Negative for congestion, ear discharge, hoarse voice and sore throat.   Eyes: Negative for discharge, redness, vision loss in right eye and visual halos.  Cardiovascular: Negative for chest pain, dyspnea on exertion, leg swelling, orthopnea and palpitations.  Respiratory: Negative for cough, hemoptysis, shortness of breath and snoring.   Endocrine: Negative for heat intolerance and polyphagia.  Hematologic/Lymphatic: Negative for bleeding problem. Does not bruise/bleed easily.  Skin: Negative for flushing, nail changes, rash and suspicious lesions.  Musculoskeletal: Negative for arthritis, joint  pain, muscle cramps, myalgias, neck pain and stiffness.  Gastrointestinal: Negative for abdominal pain, bowel incontinence, diarrhea and excessive appetite.  Genitourinary: Negative for decreased libido, genital sores and incomplete emptying.  Neurological: Negative for brief paralysis, focal weakness, headaches and loss of balance.  Psychiatric/Behavioral: Negative for altered mental status, depression and suicidal ideas.  Allergic/Immunologic: Negative for HIV exposure and persistent infections.    EKGs/Labs/Other Studies Reviewed:    The following studies were reviewed today:   EKG:  None today   Recent Labs: 01/14/2023: TSH 0.954 05/06/2023: ALT 19; BUN 15; Creatinine 0.62; Hemoglobin 11.3; Magnesium 2.0; Platelet Count 312; Potassium 3.5; Sodium 137  Recent Lipid Panel    Component Value Date/Time   CHOL 130 01/08/2023 0000   TRIG 107 01/08/2023 0000   HDL 61 01/08/2023 0000   LDLCALC 48 01/08/2023 0000    Physical Exam:    VS:  BP 104/78   Pulse 95   Ht 4\' 10"  (1.473 m)   Wt 131 lb 12.8 oz (59.8 kg)   LMP 08/18/2013   SpO2 100%   BMI 27.55 kg/m     Wt Readings from Last 3 Encounters:  05/06/23 131 lb 12.8 oz (59.8 kg)  04/26/23 132 lb 1.3 oz (59.9 kg)  04/19/23 133 lb 1.3 oz (60.4 kg)     GEN: Well nourished, well developed in no acute distress HEENT: Normal NECK: No JVD; No carotid bruits LYMPHATICS: No lymphadenopathy CARDIAC: S1S2 noted,RRR, no murmurs, rubs, gallops RESPIRATORY:  Clear to auscultation without rales, wheezing or rhonchi  ABDOMEN: Soft, non-tender, non-distended, +bowel sounds, no guarding. EXTREMITIES: No edema, No cyanosis, no clubbing MUSCULOSKELETAL:  No deformity  SKIN: Warm and dry NEUROLOGIC:  Alert and oriented x 3, non-focal PSYCHIATRIC:  Normal affect, good insight  ASSESSMENT:    1. Essential hypertension   2. Nonalcoholic steatohepatitis (NASH)   3. Type 2 diabetes mellitus without complication, without long-term current  use of insulin (HCC)   4. Obesity (BMI 30-39.9)    PLAN:     Hypertension Blood pressure on the lower side while on Labetalol 200mg  twice daily. Plan to transition to a more appropriate medication. -Reduce Labetalol to 100mg  twice daily. -Plan to transition to Metoprolol in three months. Patient to call or send a MyChart message in three months for further instructions.  Hyperlipidemia Lipid  profile from May 2024 was satisfactory. Patient is currently on Lipitor. -Continue Lipitor.  Hypokalemia Patient reports low potassium levels and is receiving Biport. -Continue current treatment and monitor potassium levels.  General Health Maintenance / Followup Plans -Plan for a follow-up visit in six months. -Ensure echocardiogram is repeated for monitoring during chemotherapy. -Check blood work results from the cancer center. The patient is in agreement with the above plan. The patient left the office in stable condition.  The patient will follow up in 6 months.   Medication Adjustments/Labs and Tests Ordered: Current medicines are reviewed at length with the patient today.  Concerns regarding medicines are outlined above.  No orders of the defined types were placed in this encounter.  Meds ordered this encounter  Medications   labetalol (NORMODYNE) 100 MG tablet    Sig: Take 1 tablet (100 mg total) by mouth 2 (two) times daily.    Dispense:  180 tablet    Refill:  3    Patient Instructions  Medication Instructions:  Your physician has recommended you make the following change in your medication:  DECREASE: Labetalol (Normodyne) 100 mg twice daily *If you need a refill on your cardiac medications before your next appointment, please call your pharmacy*   Lab Work: None   Testing/Procedures: None   Follow-Up: At [facility], you and your health needs are our priority.  As part of our continuing mission to provide you with exceptional heart care, we have created  designated Provider Care Teams.  These Care Teams include your primary Cardiologist (physician) and Advanced Practice Providers (APPs -  Physician Assistants and Nurse Practitioners) who all work together to provide you with the care you need, when you need it.     Your next appointment:   6 month(s)  Provider:   Thomasene Ripple, DO    Other instructions: Please notify Dr. Servando Salina in 3 months about our blood pressure and heart rate.    Adopting a Healthy Lifestyle.  Know what a healthy weight is for you (roughly BMI <25) and aim to maintain this   Aim for 7+ servings of fruits and vegetables daily   65-80+ fluid ounces of water or unsweet tea for healthy kidneys   Limit to max 1 drink of alcohol per day; avoid smoking/tobacco   Limit animal fats in diet for cholesterol and heart health - choose grass fed whenever available   Avoid highly processed foods, and foods high in saturated/trans fats   Aim for low stress - take time to unwind and care for your mental health   Aim for 150 min of moderate intensity exercise weekly for heart health, and weights twice weekly for bone health   Aim for 7-9 hours of sleep daily   When it comes to diets, agreement about the perfect plan isnt easy to find, even among the experts. Experts at the [facility] developed an idea known as the Healthy Eating Plate. Just imagine a plate divided into logical, healthy portions.   The emphasis is on diet quality:   Load up on vegetables and fruits - one-half of your plate: Aim for color and variety, and remember that potatoes dont count.   Go for whole grains - one-quarter of your plate: Whole wheat, barley, wheat berries, quinoa, oats, brown rice, and foods made with them. If you want pasta, go with whole wheat pasta.   Protein power - one-quarter of your plate: Fish, chicken, beans, and nuts are all healthy,  versatile protein sources. Limit red meat.   The diet, however, does go  beyond the plate, offering a few other suggestions.   Use healthy plant oils, such as olive, canola, soy, corn, sunflower and peanut. Check the labels, and avoid partially hydrogenated oil, which have unhealthy trans fats.   If youre thirsty, drink water. Coffee and tea are good in moderation, but skip sugary drinks and limit milk and dairy products to one or two daily servings.   The type of carbohydrate in the diet is more important than the amount. Some sources of carbohydrates, such as vegetables, fruits, whole grains, and beans-are healthier than others.   Finally, stay active  Signed, Thomasene Ripple, DO  05/06/2023 9:08 PM    [facility]

## 2023-05-07 ENCOUNTER — Inpatient Hospital Stay (INDEPENDENT_AMBULATORY_CARE_PROVIDER_SITE_OTHER): Payer: BC Managed Care – PPO | Admitting: Hematology and Oncology

## 2023-05-07 ENCOUNTER — Encounter: Payer: Self-pay | Admitting: Hematology and Oncology

## 2023-05-07 ENCOUNTER — Other Ambulatory Visit: Payer: Self-pay

## 2023-05-07 ENCOUNTER — Other Ambulatory Visit: Payer: BC Managed Care – PPO

## 2023-05-07 VITALS — BP 111/83 | HR 87 | Temp 98.1°F | Resp 16 | Ht <= 58 in | Wt 131.0 lb

## 2023-05-07 DIAGNOSIS — C7951 Secondary malignant neoplasm of bone: Secondary | ICD-10-CM | POA: Diagnosis not present

## 2023-05-07 DIAGNOSIS — R1310 Dysphagia, unspecified: Secondary | ICD-10-CM

## 2023-05-07 DIAGNOSIS — C50412 Malignant neoplasm of upper-outer quadrant of left female breast: Secondary | ICD-10-CM | POA: Diagnosis not present

## 2023-05-07 DIAGNOSIS — R221 Localized swelling, mass and lump, neck: Secondary | ICD-10-CM | POA: Insufficient documentation

## 2023-05-07 DIAGNOSIS — Z170 Estrogen receptor positive status [ER+]: Secondary | ICD-10-CM

## 2023-05-07 NOTE — Assessment & Plan Note (Addendum)
Mild soft tissue swelling of the right neck without discrete mass.  This is associated with odynophagia.  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  Will obtain CT soft tissue neck for further evaluation

## 2023-05-07 NOTE — Assessment & Plan Note (Signed)
Pain with swallowing for the past few days. There is slight right neck swelling without discrete mass.   No fevers or chills.  Will obtain a CT neck soft tissue to further evaluate.

## 2023-05-07 NOTE — Progress Notes (Signed)
[facility]  [address] [place],  [place]  [address] [phone]  Clinic Day:  05/07/2023  Referring physician: Dellia Beckwith, MD  ASSESSMENT & PLAN:   Assessment & Plan: Malignant neoplasm metastatic to bone [facility]) Bone metastases diagnosed in November 2022, with multiple marrow replacing lesions within the proximal right femur within the ischium as well as the inferior rami. 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 Ki67 was 60%. PET imaging in December revealed dominant finding of intensely hypermetabolic aggressive skeletal metastasis involving the calvarium, sternum, thoracic and lumbar spine, and pelvis, with potential pathologic fracture of the right femur with internal fixation. There was soft tissue extension into the anterior mediastinum associated with the manubrial metastasis. Multiple spinal vertebral body lesions had cortical destruction along the posterior margin. She started monthly Xgeva in December, and completed radiation therapy. She was receiving THP, but cycle 4 was delayed and dose reduction made by 20% due to left leg skin infection.   She continued with Herceptin/Perjeta until she had progression of disease. In January 2024, she developed increased pain of the left hip. She was found to have innumerable metastatic lesions throughout the pelvis and proximal femur. There is a non displaced pathologic fracture through a large lesion in the superior left acetabulum. She was referred to an orthopedic oncologist at [facility] and they recommended radiation and no surgery. She received her palliative radiation. She followed-up with [facility] in April and they do not recommend surgery, but will continue surveillance.   We then switched her to [facility] chemotherapy, but she has had significant toxicities of nausea, vomiting, diarrhea, and hypokalemia.   She continues monthly Xgeva.  She was also placed on hormonal therapy with Faslodex injections.  Repeat CT imaging in July revealed decreased soft tissue thickening around the left subclavian structures and thoracic inlet.  Left lateral breast mass was stable.  Widespread mixed lytic and sclerotic osseous metastatic lesions were stable.  There was unchanged severe elevation of the left hemidiaphragm. Her pain is fairly well-controlled with the current regimen.  She continues Enhertu every 3 weeks.  She will proceed with 9th cycle this week.  She will continue denosumab every 6 weeks.  Odynophagia Pain with swallowing for the past few days. There is slight right neck swelling without discrete mass.   No fevers or chills.  Will obtain a CT neck soft tissue to further evaluate.  Localized swelling, mass and lump, neck Mild soft tissue swelling of the right neck without discrete mass.  This is associated with odynophagia.  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  Will obtain CT soft tissue neck for further evaluation     The patient understands the plans discussed today and is in agreement with them.  She knows to contact our office if she develops concerns prior to her next appointment.   I provided 30 minutes of face-to-face time during this encounter and > 50% was spent counseling as documented under my assessment and plan.    Adah Perl, PA-C  [facility] AT [facility] [address] [place] [address] Dept: [phone] Dept Fax: [phone]   Orders Placed This Encounter  Procedures   CT Soft Tissue Neck W Contrast    Standing Status:   Future    Standing Expiration Date:   05/06/2024    Scheduling  Instructions:     RH    Order Specific Question:   If indicated for the ordered procedure, I authorize the administration of contrast media per Radiology protocol    Answer:   Yes    Order Specific  Question:   Does the patient have a contrast media/X-ray dye allergy?    Answer:   No    Order Specific Question:   Is patient pregnant?    Answer:   No    Order Specific Question:   Preferred imaging location?    Answer:   External      CHIEF COMPLAINT:  CC: Metastatic HER2 receptor breast cancer  Current Treatment: Enhertu every 3 weeks with denosumab every 6 weeks  HISTORY OF PRESENT ILLNESS:  Anna [name] is a 52 y.o. female originally diagnosed with stage IIB (T2 N1a M0) hormone receptor positive left breast cancer in December 2014.  She was treated with lumpectomy.  Pathology revealed a 2.1 cm, grade 3, invasive ductal carcinoma with 1 lymph node positive for metastasis.  Lymphovascular invasion was seen.  Estrogen and progesterone receptors were positive and her 2 Neu negative.  Ki 67 was 76%.  She received adjuvant chemotherapy with 4 cycles of doxorubicin and cyclophosphamide, followed by 12 weeks of weekly paclitaxel.  She then received adjuvant radiation to the left breast.  She was placed on tamoxifen 20 mg daily in October 2015.  She underwent testing for hereditary cancer syndromes with [employer] myRisk Hereditary Cancer panel, which did not reveal any clinically significant mutation or variants of uncertain significance, so no change in management was recommended.  She was found to have fatty infiltration of the liver on a CTA chest in June 2015.  Bone density scan in November 2017 was normal.  Bilateral diagnostic mammogram in November 2018 did not reveal any evidence of malignancy.   When she was seen in May 2019, she had mild elevation of the AST, which was felt to likely be due to medication effects. She had a bilateral diagnostic mammogram in January 2020.  At that time, she reported a lump in the left breast, so an ultrasound was also done.  Bilateral mammogram and left breast ultrasound did not reveal any suspicious findings.  Postsurgical scarring was seen in the left  breast.  The area of palpable abnormality in the left breast was overlying the postsurgical scarring.  She was rescheduled in our office in May 2020 and found to have significant increase in her liver transaminases.  Tamoxifen was discontinued.  She was instructed to avoid alcohol and Tylenol.  CT abdomen and pelvis revealed severe hepatic steatosis.  Bone density scan in June 2020 revealed mild osteopenia with a T-score -1.4 in the spine.  Her last menstruation was in January 2015 and labs confirmed her postmenopausal status.  She was placed on anastrozole 1 mg daily in June 2020.  She was also referred to hepatology and saw Annamarie Major, NP, who diagnosed the patient with nonalcoholic fatty liver disease.  She was unsure if this was secondary to tamoxifen, but felt it was unlikely.  Liver biopsy in July 2020 confirmed severe steatosis of 75-80% with moderate ballooning degeneration.  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  The patient has had improvement in the liver transaminases. Abdominal ultrasound per Ms. Elsie Stain which revealed an enlarging exophytic cyst of the right kidney.  This has been appreciated for many years, on PET imaging from  2014 this measured 10.6 cm, CT in May 2020 this was 12.4 cm, and recent abdominal ultrasound from July measured this at 14.5 cm. MRI done in November confirmed a benign cyst of the right kidney.  She continues to see Dr. Elsie Stain every 6 months.  She has also been seen by dermatology at [facility], with a good report.    She began to have right hip pain and functional disability with rapid worsening. MRI right hip in November 2022 revealed multiple marrow replacing lesions within the proximal femur within the ischium as well as the inferior rami. These findings were most consistent with metastatic disease and impending fracture. The primary and largest focus nearly fills the entire medullary space at the femoral neck with  endosteal thinning and irregularity, most severely over the anterior cortex. She underwent right hip arthroplasty in November with Dr. Durene Romans. Surgical pathology confirmed metastatic carcinoma, consistent with patient's clinical history of primary breast carcinoma.    Prognostic profile confirmed estrogen receptor was positive at 95%, and progesterone receptor was positive at 40%. HER2 was positive 3+, which was negative on her original cancer. PET in December 2022 revealed dominant finding of intensely hypermetabolic aggressive skeletal metastasis involving the calvarium, sternum, thoracic and lumbar spine, and pelvis. There is potential pathologic fracture of the right femur with internal fixation, and soft tissue extension into the anterior mediastinum associated with the manubrial metastasis. Multiple spinal vertebral body lesions have cortical destruction along the posterior margin. Baseline ECHO revealed normal left ventricular size and function with an ejection fraction of 55-60%. Bone density from December revealed mild osteopenia of the AP spine with a T-score of -1.2.   She was initially treated with docetaxel/trastuzumab/pertuzumab 4 cycles.  Due to left leg staphylococcal infection, cycle 4 was delayed and docetaxel dose was reduced by 20%.  Docetaxel was then discontinued due to difficulty with wound healing.  She has continued trastuzumab/pertuzumab/denosumab.  Most recent echocardiogram in December was stable with an ejection fraction of 55 to 60%.  She had elevation of the transaminases during chemotherapy, so we did not restart therapy.  The transaminases normalized in December.  She was started on monthly fulvestrant injections in January.    Unfortunately CT imaging in February revealed progressive disease within the lymph nodes and bone.  We therefore switched the HER2 targeted therapy to Enhertu every 3 weeks.  We continued fulvestrant and denosumab monthly.  She has had some  difficulty tolerating this due to nausea, vomiting, diarrhea, and hypokalemia, which has improved with a dose reduction. Repeat CT imaging in July revealed decreased soft tissue thickening around the left subclavian structures and thoracic inlet.  Left lateral breast mass was stable.  Widespread mixed lytic and sclerotic osseous metastatic lesions were stable.  There was unchanged severe elevation of the left hemidiaphragm.    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 (HCC)   07/23/2013 Cancer Staging   Staging form: Breast, AJCC 7th Edition - Clinical stage from 07/23/2013: Stage IIB (T2, N1, M0) - Signed by Dellia Beckwith, MD on 07/04/2020 Prognostic indicators: Pos LVI   09/22/2021 - 04/06/2022 Chemotherapy   Patient is on Treatment Plan : BREAST  Trastuzumab + Pertuzumab q21d      09/22/2021 - 09/21/2022 Chemotherapy   Patient is on Treatment Plan : BREAST Trastuzumab + Pertuzumab q21d     10/12/2022 -  Chemotherapy  Patient is on Treatment Plan : BREAST METASTATIC Fam-Trastuzumab Deruxtecan-nxki (Enhertu) (5.4) q21d     Malignant neoplasm metastatic to bone (HCC)  06/28/2021 Initial Diagnosis   Bone metastases (HCC)   09/22/2021 - 04/06/2022 Chemotherapy   Patient is on Treatment Plan : BREAST  Trastuzumab + Pertuzumab q21d      09/22/2021 - 09/21/2022 Chemotherapy   Patient is on Treatment Plan : BREAST Trastuzumab + Pertuzumab q21d     05/11/2022 Imaging   CT chest, abdomen and pelvis:  IMPRESSION:  1. Widespread bony metastatic disease shows increased sclerosis  since previous imaging. This is likely related to response to  therapy in the interval. There is a single lesion along the LEFT  iliac which shows continued lytic change and warrants attention on  follow-up  2. Interval development of pathologic fractures at in the upper   thoracic spine and at the thoracolumbar junction with associated  kyphosis.  3. No signs of solid organ or nodal metastatic disease.  4. Marked elevation of the LEFT hemidiaphragm as on the prior PET.  5. Aortic atherosclerosis.    10/12/2022 -  Chemotherapy   Patient is on Treatment Plan : BREAST METASTATIC Fam-Trastuzumab Deruxtecan-nxki (Enhertu) (5.4) q21d         INTERVAL HISTORY:  Anna [name] is here today for repeat clinical assessment prior to an 11th cycle of Enhertu.  She reports new pain with swallowing with swelling of the right neck.  She has stable fatigue.  She otherwise denies new complaints.  She denies fevers or chills.  Her appetite is good. Her weight has been stable.   REVIEW OF SYSTEMS:  Review of Systems  Constitutional:  Positive for fatigue. Negative for appetite change, chills, fever and unexpected weight change.  HENT:   Positive for trouble swallowing. Negative for lump/mass, mouth sores, nosebleeds and sore throat.   Eyes:  Negative for eye problems.  Respiratory:  Negative for cough and shortness of breath.   Cardiovascular:  Negative for chest pain and leg swelling.  Gastrointestinal:  Negative for abdominal pain, constipation, diarrhea, nausea and vomiting.  Genitourinary:  Negative for difficulty urinating, dysuria, frequency and hematuria.   Musculoskeletal:  Negative for arthralgias, back pain and myalgias.  Skin:  Negative for rash.  Neurological:  Negative for dizziness and headaches.  Hematological:  Negative for adenopathy. Does not bruise/bleed easily.  Psychiatric/Behavioral:  Negative for depression and sleep disturbance. The patient is not nervous/anxious.      VITALS:  Blood pressure 111/83, pulse 87, temperature 98.1 F (36.7 C), temperature source Oral, resp. rate 16, height 4\' 10"  (1.473 m), weight 131 lb (59.4 kg), last menstrual period 08/18/2013, SpO2 98%.  Wt Readings from Last 3 Encounters:  05/07/23 131 lb (59.4 kg)  05/06/23 131 lb  12.8 oz (59.8 kg)  04/26/23 132 lb 1.3 oz (59.9 kg)    Body mass index is 27.38 kg/m.  Performance status (ECOG): 2 - Symptomatic, <50% confined to bed  PHYSICAL EXAM:  Physical Exam Vitals and nursing note reviewed.  Constitutional:      General: She is not in acute distress.    Appearance: Normal appearance. She is ill-appearing (Chronically ill-appearing).  HENT:     Head: Normocephalic and atraumatic.     Mouth/Throat:     Mouth: Mucous membranes are dry.     Comments: She is unable to relax her tongue enough to see her posterior oral cavity or pharynx Eyes:     General: No scleral icterus.  Extraocular Movements: Extraocular movements intact.     Conjunctiva/sclera: Conjunctivae normal.     Pupils: Pupils are equal, round, and reactive to light.  Cardiovascular:     Rate and Rhythm: Normal rate and regular rhythm.     Heart sounds: Normal heart sounds. No murmur heard.    No friction rub. No gallop.  Pulmonary:     Effort: Pulmonary effort is normal.     Breath sounds: Normal breath sounds. No wheezing, rhonchi or rales.  Chest:     Comments: Breast exam is deferred Abdominal:     General: There is no distension.     Palpations: Abdomen is soft. There is no mass.     Tenderness: There is no abdominal tenderness.  Musculoskeletal:     Cervical back: Edema (Mild soft tissue swelling of the right neck, no discrete mass) and rigidity (Chronic, stable) present. No erythema or tenderness. Decreased range of motion (Chronic, stable).     Right lower leg: No edema.     Left lower leg: No edema.  Lymphadenopathy:     Cervical: No cervical adenopathy.     Upper Body:     Right upper body: No supraclavicular or axillary adenopathy.     Left upper body: No supraclavicular or axillary adenopathy.  Skin:    General: Skin is warm and dry.     Coloration: Skin is not jaundiced.     Findings: No rash.  Neurological:     Mental Status: She is alert and oriented to person,  place, and time.     Cranial Nerves: No cranial nerve deficit.  Psychiatric:        Mood and Affect: Mood normal.        Behavior: Behavior normal.        Thought Content: Thought content normal.     LABS:      Latest Ref Rng & Units 05/06/2023    2:07 PM 04/12/2023    1:33 PM 03/25/2023    3:19 PM  CBC  WBC 4.0 - 10.5 K/uL 4.4  4.6  5.7   Hemoglobin 12.0 - 15.0 g/dL 40.9  81.1  91.4   Hematocrit 36.0 - 46.0 % 36.9  33.5  35.8   Platelets 150 - 400 K/uL 312  343  315       Latest Ref Rng & Units 05/06/2023    2:07 PM 04/12/2023    1:33 PM 03/25/2023    3:19 PM  CMP  Glucose 70 - 99 mg/dL 782  956  213   BUN 6 - 20 mg/dL 15  17  17    Creatinine 0.44 - 1.00 mg/dL 0.86  5.78  4.69   Sodium 135 - 145 mmol/L 137  141  143   Potassium 3.5 - 5.1 mmol/L 3.5  3.1  2.7   Chloride 98 - 111 mmol/L 101  103  101   CO2 22 - 32 mmol/L 29  30  30    Calcium 8.9 - 10.3 mg/dL 8.7  8.7  9.5   Total Protein 6.5 - 8.1 g/dL 6.1  5.8  5.8   Total Bilirubin 0.3 - 1.2 mg/dL 0.4  0.4  0.2   Alkaline Phos 38 - 126 U/L 63  60  55   AST 15 - 41 U/L 18  15  17    ALT 0 - 44 U/L 19  18  14       Lab Results  Component Value Date   CEA1 2.2 07/20/2021   /  CEA  Date Value Ref Range Status  07/20/2021 2.2 0.0 - 4.7 ng/mL Final    Comment:    (NOTE)                             Nonsmokers          <3.9                             Smokers             <5.6 [employer] Electrochemiluminescence Immunoassay (ECLIA) Values obtained with different assay methods or kits cannot be used interchangeably.  Results cannot be interpreted as absolute evidence of the presence or absence of malignant disease. Performed At: [facility] [address] [place], [place] [id number] Jolene Schimke MD NW:[id number]    No results found for: "PSA1" No results found for: "CAN199" No results found for: "CAN125"  No results found for: "TOTALPROTELP", "ALBUMINELP", "A1GS", "A2GS", "BETS", "BETA2SER",  "GAMS", "MSPIKE", "SPEI" Lab Results  Component Value Date   TIBC 256 01/14/2023   TIBC 287 04/24/2022   TIBC 279 10/13/2021   FERRITIN 333 (H) 01/14/2023   FERRITIN 119 04/24/2022   FERRITIN 206 10/13/2021   IRONPCTSAT 16 01/14/2023   IRONPCTSAT 9 (L) 04/24/2022   IRONPCTSAT 13 10/13/2021   No results found for: "LDH"  STUDIES:  No results found.    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   Bipolar disorder (HCC) 11/29/2020   Breast cancer (HCC)    Cancer (HCC)    Cardiac murmur    Chest pain    Chronic fatigue syndrome 11/29/2020   Depression    Essential hypertension 11/29/2020   Hepatic steatosis determined by biopsy of liver 07/04/2020   HLD (hyperlipidemia)    Hypertension    Hypomagnesemia 03/26/2023   Hypothyroidism 11/29/2020   Insomnia 11/29/2020   Iron deficiency anemia 06/05/2022   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   Migraine 11/29/2020   Mild intermittent asthma 11/29/2020   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   Mixed hyperlipidemia 11/29/2020   Obesity due to excess calories    Osteopenia after menopause 07/04/2020   Other long term (current) drug therapy 11/29/2020   Other vitamin B12 deficiency anemias 11/29/2020   Personal history of malignant neoplasm of breast 11/29/2020   Pre-diabetes    Renal cyst, acquired, right 07/04/2020   14.5 cm in October 2021   Retrocalcaneal bursitis (back of heel), left 04/22/2018   Seasonal allergic rhinitis 11/29/2020   Tightness of heel cord, left 04/22/2018   Type 2 diabetes mellitus without complications (HCC) 11/29/2020   Vitamin D deficiency 11/29/2020    Past Surgical History:  Procedure Laterality Date   CESAREAN SECTION     IR  BONE TUMOR(S)RF ABLATION  05/29/2022   IR BONE TUMOR(S)RF ABLATION  05/29/2022   IR KYPHO EA ADDL LEVEL THORACIC OR LUMBAR  05/29/2022   IR KYPHO THORACIC WITH BONE BIOPSY  05/29/2022   IR RADIOLOGIST EVAL & MGMT  05/22/2022   IR RADIOLOGIST EVAL & MGMT  06/12/2022   LIVER BIOPSY  01/2019   lumpectomy  2014   Left Breast   PORT-A-CATH REMOVAL  PORTACATH PLACEMENT  08/28/2013   STOMACH SURGERY     Tummy Tuck   TOTAL HIP ARTHROPLASTY Right 07/04/2021   Procedure: TOTAL HIP ARTHROPLASTY;  Surgeon: Durene Romans, MD;  Location: [facility];  Service: Orthopedics;  Laterality: Right;  90   WISDOM TOOTH EXTRACTION      Family History  Problem Relation Age of Onset   Hyperlipidemia Mother    Diabetes Father    Stroke Father    Colon cancer Neg Hx    Colon polyps Neg Hx    Esophageal cancer Neg Hx    Rectal cancer Neg Hx    Stomach cancer Neg Hx     Social History:  reports that she quit smoking about 20 years ago. Her smoking use included cigarettes. She has never used smokeless tobacco. She reports current alcohol use. She reports that she does not use drugs.The patient is accompanied by her husband today.  Allergies:  Allergies  Allergen Reactions   Reglan [Metoclopramide]     Made patient feel really funny when taking this medication     Current Medications: Current Outpatient Medications  Medication Sig Dispense Refill   albuterol (VENTOLIN HFA) 108 (90 Base) MCG/ACT inhaler Inhale 2 puffs into the lungs every 6 (six) hours as needed for shortness of breath.     ARIPiprazole (ABILIFY) 5 MG tablet Take 2.5 mg by mouth daily.     aspirin 81 MG EC tablet Take 81 mg by mouth daily. Swallow whole.     atorvastatin (LIPITOR) 20 MG tablet TAKE ONE TABLET BY MOUTH EVERY DAY 90 tablet 0   augmented betamethasone dipropionate (DIPROLENE-AF) 0.05 % cream      dapagliflozin propanediol (FARXIGA) 10 MG TABS tablet TAKE ONE TABLET BY MOUTH EVERY DAY 30 tablet 2   desvenlafaxine  (PRISTIQ) 50 MG 24 hr tablet Take 50 mg by mouth daily.     dexamethasone (DECADRON) 4 MG tablet Take 1 tablet (4 mg total) by mouth 2 (two) times daily. For 5 days after chemotherapy 20 tablet 2   diphenoxylate-atropine (LOMOTIL) 2.5-0.025 MG tablet TAKE TWO TABLETS BY MOUTH FOUR TIMES A DAY AS NEEDED FOR DIARRHEA OR LOOSE STOOLS 100 tablet 1   labetalol (NORMODYNE) 100 MG tablet Take 1 tablet (100 mg total) by mouth 2 (two) times daily. 180 tablet 3   loratadine (CLARITIN) 10 MG tablet TAKE ONE TABLET BY MOUTH DAILY 30 tablet 2   LORazepam (ATIVAN) 1 MG tablet TAKE ONE TABLET BY MOUTH EVERY 8 HOURS 30 tablet 1   magic mouthwash (lidocaine, diphenhydrAMINE, alum & mag hydroxide) suspension Take 5 mLs by mouth every 3 (three) hours as needed. Swish and spit; for throat/mouth pain     naloxone (NARCAN) nasal spray 4 mg/0.1 mL One spray in nostril as needed, may repeat every 2 to 3 minutes until medical assistance becomes available 4 each 1   nitroGLYCERIN (NITROSTAT) 0.4 MG SL tablet Place 0.4 mg under the tongue every 5 (five) minutes as needed for chest pain.     nystatin (MYCOSTATIN/NYSTOP) powder Apply 1 Application topically 2 (two) times daily.     omeprazole (PRILOSEC) 40 MG capsule Take 1 capsule (40 mg total) by mouth at bedtime. 30 capsule 5   ondansetron (ZOFRAN) 8 MG tablet Take 1 tablet (8 mg total) by mouth every 8 (eight) hours as needed for nausea or vomiting. 60 tablet 5   Oxycodone HCl 10 MG TABS TAKE ONE TABLET BY MOUTH EVERY 4 HOURS AS NEEDED FOR PAIN 180 tablet  0   Potassium Chloride 10 % SOLN Take 20 mEq by mouth 2 (two) times daily. 900 mL 2   prochlorperazine (COMPAZINE) 10 MG tablet TAKE ONE TABLET BY MOUTH EVERY 6 HOURS AS NEEDED FOR NAUSEA/VOMITING 90 tablet 3   Current Facility-Administered Medications  Medication Dose Route Frequency Provider Last Rate Last Admin   0.9 %  sodium chloride infusion  500 mL Intravenous Once Lynann Bologna, MD       technetium sestamibi  generic (CARDIOLITE) injection 10.5 millicurie  10.5 millicurie Intravenous Once PRN Revankar, Aundra Dubin, MD

## 2023-05-07 NOTE — Assessment & Plan Note (Signed)
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 Ki67 was 60%. PET imaging in December revealed dominant finding of intensely hypermetabolic aggressive skeletal metastasis involving the calvarium, sternum, thoracic and lumbar spine, and pelvis, with potential pathologic fracture of the right femur with internal fixation. There was soft tissue extension into the anterior mediastinum associated with the manubrial metastasis. Multiple spinal vertebral body lesions had cortical destruction along the posterior margin. She started monthly Xgeva in December, and completed radiation therapy. She was receiving THP, but cycle 4 was delayed and dose reduction made by 20% due to left leg skin infection.   She continued with Herceptin/Perjeta until she had progression of disease. In January 2024, she developed increased pain of the left hip. She was found to have innumerable metastatic lesions throughout the pelvis and proximal femur. There is a non displaced pathologic fracture through a large lesion in the superior left acetabulum. She was referred to an orthopedic oncologist at [facility] and they recommended radiation and no surgery. She received her palliative radiation. She followed-up with [facility] in April and they do not recommend surgery, but will continue surveillance.   We then switched her to [facility] chemotherapy, but she has had significant toxicities of nausea, vomiting, diarrhea, and hypokalemia.  She continues monthly Xgeva.  She was also placed on hormonal therapy with Faslodex injections.  Repeat CT imaging in July revealed decreased soft tissue thickening around the left subclavian structures and thoracic inlet.  Left lateral breast mass was stable.  Widespread  mixed lytic and sclerotic osseous metastatic lesions were stable.  There was unchanged severe elevation of the left hemidiaphragm. Her pain is fairly well-controlled with the current regimen.  She continues Enhertu every 3 weeks.  She will proceed with 9th cycle this week.  She will continue denosumab every 6 weeks.

## 2023-05-09 MED FILL — Dexamethasone Sodium Phosphate Inj 100 MG/10ML: INTRAMUSCULAR | Qty: 1 | Status: AC

## 2023-05-09 MED FILL — Fosaprepitant Dimeglumine For IV Infusion 150 MG (Base Eq): INTRAVENOUS | Qty: 5 | Status: AC

## 2023-05-10 ENCOUNTER — Other Ambulatory Visit: Payer: Self-pay | Admitting: Oncology

## 2023-05-10 ENCOUNTER — Inpatient Hospital Stay: Payer: BC Managed Care – PPO

## 2023-05-10 ENCOUNTER — Other Ambulatory Visit: Payer: Self-pay | Admitting: Hematology and Oncology

## 2023-05-10 VITALS — BP 116/67 | HR 88 | Temp 98.4°F | Resp 18 | Ht <= 58 in | Wt 131.0 lb

## 2023-05-10 DIAGNOSIS — Z5111 Encounter for antineoplastic chemotherapy: Secondary | ICD-10-CM | POA: Diagnosis not present

## 2023-05-10 DIAGNOSIS — M545 Low back pain, unspecified: Secondary | ICD-10-CM

## 2023-05-10 DIAGNOSIS — C7951 Secondary malignant neoplasm of bone: Secondary | ICD-10-CM

## 2023-05-10 DIAGNOSIS — C50412 Malignant neoplasm of upper-outer quadrant of left female breast: Secondary | ICD-10-CM

## 2023-05-10 DIAGNOSIS — R0789 Other chest pain: Secondary | ICD-10-CM

## 2023-05-10 DIAGNOSIS — S22078A Other fracture of T9-T10 vertebra, initial encounter for closed fracture: Secondary | ICD-10-CM

## 2023-05-10 MED ORDER — DIPHENHYDRAMINE HCL 25 MG PO CAPS
50.0000 mg | ORAL_CAPSULE | Freq: Once | ORAL | Status: DC
  Filled 2023-05-10: qty 2

## 2023-05-10 MED ORDER — OXYCODONE HCL 10 MG PO TABS: 10.0000 mg | ORAL_TABLET | ORAL | 0 refills | Status: DC | PRN

## 2023-05-10 MED ORDER — SODIUM CHLORIDE 0.9 % IV SOLN
10.0000 mg | Freq: Once | INTRAVENOUS | Status: AC
  Administered 2023-05-10: 10 mg via INTRAVENOUS
  Filled 2023-05-10: qty 10

## 2023-05-10 MED ORDER — HEPARIN SOD (PORK) LOCK FLUSH 100 UNIT/ML IV SOLN
500.0000 [IU] | Freq: Once | INTRAVENOUS | Status: AC | PRN
  Administered 2023-05-10: 500 [IU]

## 2023-05-10 MED ORDER — SODIUM CHLORIDE 0.9% FLUSH
10.0000 mL | INTRAVENOUS | Status: DC | PRN
  Administered 2023-05-10: 10 mL

## 2023-05-10 MED ORDER — FAM-TRASTUZUMAB DERUXTECAN-NXKI CHEMO 100 MG IV SOLR
5.4000 mg/kg | Freq: Once | INTRAVENOUS | Status: AC
  Administered 2023-05-10: 340 mg via INTRAVENOUS
  Filled 2023-05-10: qty 17

## 2023-05-10 MED ORDER — SODIUM CHLORIDE 0.9 % IV SOLN
150.0000 mg | Freq: Once | INTRAVENOUS | Status: AC
  Administered 2023-05-10: 150 mg via INTRAVENOUS
  Filled 2023-05-10: qty 150

## 2023-05-10 MED ORDER — DEXTROSE 5 % IV SOLN: Freq: Once | INTRAVENOUS | Status: AC

## 2023-05-10 MED ORDER — PALONOSETRON HCL INJECTION 0.25 MG/5ML
0.2500 mg | Freq: Once | INTRAVENOUS | Status: AC
  Administered 2023-05-10: 0.25 mg via INTRAVENOUS
  Filled 2023-05-10: qty 5

## 2023-05-10 MED ORDER — ACETAMINOPHEN 325 MG PO TABS
650.0000 mg | ORAL_TABLET | Freq: Once | ORAL | Status: DC
  Filled 2023-05-10: qty 2

## 2023-05-10 NOTE — Patient Instructions (Signed)
Fam-Trastuzumab Deruxtecan Injection What is this medication? FAM-TRASTUZUMAB DERUXTECAN (fam-tras TOOZ eu mab DER ux TEE kan) treats some types of cancer. It works by blocking a protein that causes cancer cells to grow and multiply. This helps to slow or stop the spread of cancer cells. This medicine may be used for other purposes; ask your health care provider or pharmacist if you have questions. COMMON BRAND NAME(S): ENHERTU What should I tell my care team before I take this medication? They need to know if you have any of these conditions: Heart disease Heart failure Infection, especially a viral infection, such as chickenpox, cold sores, or herpes Liver disease Lung or breathing disease, such as asthma or COPD An unusual or allergic reaction to fam-trastuzumab deruxtecan, other medications, foods, dyes, or preservatives Pregnant or trying to get pregnant Breast-feeding How should I use this medication? This medication is injected into a vein. It is given by your care team in a hospital or clinic setting. A special MedGuide will be given to you before each treatment. Be sure to read this information carefully each time. Talk to your care team about the use of this medication in children. Special care may be needed. Overdosage: If you think you have taken too much of this medicine contact a poison control center or emergency room at once. NOTE: This medicine is only for you. Do not share this medicine with others. What if I miss a dose? It is important not to miss your dose. Call your care team if you are unable to keep an appointment. What may interact with this medication? Interactions are not expected. This list may not describe all possible interactions. Give your health care provider a list of all the medicines, herbs, non-prescription drugs, or dietary supplements you use. Also tell them if you smoke, drink alcohol, or use illegal drugs. Some items may interact with your  medicine. What should I watch for while using this medication? Visit your care team for regular checks on your progress. Tell your care team if your symptoms do not start to get better or if they get worse. Your condition will be monitored carefully while you are receiving this medication. Do not become pregnant while taking this medication or for 7 months after stopping it. Women should inform their care team if they wish to become pregnant or think they might be pregnant. Men should not father a child while taking this medication and for 4 months after stopping it. There is potential for serious side effects to an unborn child. Talk to your care team for more information. Do not breast-feed an infant while taking this medication or for 7 months after the last dose. This medication has caused decreased sperm counts in some men. This may make it more difficult to father a child. Talk to your care team if you are concerned about your fertility. This medication may increase your risk to bruise or bleed. Call your care team if you notice any unusual bleeding. Be careful brushing or flossing your teeth or using a toothpick because you may get an infection or bleed more easily. If you have any dental work done, tell your dentist you are receiving this medication. This medication may cause dry eyes and blurred vision. If you wear contact lenses, you may feel some discomfort. Lubricating eye drops may help. See your care team if the problem does not go away or is severe. This medication may increase your risk of getting an infection. Call your care team for   advice if you get a fever, chills, sore throat, or other symptoms of a cold or flu. Do not treat yourself. Try to avoid being around people who are sick. Avoid taking medications that contain aspirin, acetaminophen, ibuprofen, naproxen, or ketoprofen unless instructed by your care team. These medications may hide a fever. What side effects may I notice from  receiving this medication? Side effects that you should report to your care team as soon as possible: Allergic reactions--skin rash, itching, hives, swelling of the face, lips, tongue, or throat Dry cough, shortness of breath or trouble breathing Infection--fever, chills, cough, sore throat, wounds that don't heal, pain or trouble when passing urine, general feeling of discomfort or being unwell Heart failure--shortness of breath, swelling of the ankles, feet, or hands, sudden weight gain, unusual weakness or fatigue Unusual bruising or bleeding Side effects that usually do not require medical attention (report these to your care team if they continue or are bothersome): Constipation Diarrhea Hair loss Muscle pain Nausea Vomiting This list may not describe all possible side effects. Call your doctor for medical advice about side effects. You may report side effects to FDA at 1-800-FDA-1088. Where should I keep my medication? This medication is given in a hospital or clinic. It will not be stored at home. NOTE: This sheet is a summary. It may not cover all possible information. If you have questions about this medicine, talk to your doctor, pharmacist, or health care provider.  2024 [employer]/Gold Standard (2021-05-16 00:00:00)  

## 2023-05-15 ENCOUNTER — Other Ambulatory Visit: Payer: BC Managed Care – PPO

## 2023-05-16 ENCOUNTER — Telehealth: Payer: Self-pay

## 2023-05-16 NOTE — Telephone Encounter (Signed)
CALLED PATIENT SHE WILL BE IN AT 0900 ON 10/14. MOVED FROM 10/11./RB

## 2023-05-24 ENCOUNTER — Encounter: Payer: Self-pay | Admitting: Oncology

## 2023-05-24 ENCOUNTER — Ambulatory Visit: Payer: BC Managed Care – PPO

## 2023-05-24 NOTE — Addendum Note (Signed)
Addended by: Domenic Schwab on: 05/24/2023 04:11 PM   Modules accepted: Orders

## 2023-05-27 ENCOUNTER — Other Ambulatory Visit: Payer: Self-pay | Admitting: Cardiology

## 2023-05-27 ENCOUNTER — Encounter: Payer: Self-pay | Admitting: Oncology

## 2023-05-27 ENCOUNTER — Inpatient Hospital Stay: Payer: BC Managed Care – PPO | Attending: Hematology and Oncology

## 2023-05-27 VITALS — BP 111/83 | HR 98 | Temp 98.8°F | Resp 20 | Ht <= 58 in | Wt 131.0 lb

## 2023-05-27 DIAGNOSIS — C50412 Malignant neoplasm of upper-outer quadrant of left female breast: Secondary | ICD-10-CM | POA: Insufficient documentation

## 2023-05-27 DIAGNOSIS — Z5112 Encounter for antineoplastic immunotherapy: Secondary | ICD-10-CM | POA: Insufficient documentation

## 2023-05-27 DIAGNOSIS — Z170 Estrogen receptor positive status [ER+]: Secondary | ICD-10-CM | POA: Diagnosis not present

## 2023-05-27 DIAGNOSIS — C7951 Secondary malignant neoplasm of bone: Secondary | ICD-10-CM | POA: Insufficient documentation

## 2023-05-27 DIAGNOSIS — Z5111 Encounter for antineoplastic chemotherapy: Secondary | ICD-10-CM | POA: Diagnosis present

## 2023-05-27 LAB — CBC AND DIFFERENTIAL
HCT: 33 — AB (ref 36–46)
Hemoglobin: 11 — AB (ref 12.0–16.0)
Neutrophils Absolute: 2.95
Platelets: 279 10*3/uL (ref 150–400)
WBC: 4.4

## 2023-05-27 LAB — CBC: RBC: 3.69 — AB (ref 3.87–5.11)

## 2023-05-27 LAB — HEPATIC FUNCTION PANEL
ALT: 18 U/L (ref 7–35)
AST: 22 (ref 13–35)
Alkaline Phosphatase: 62 (ref 25–125)
Bilirubin, Total: 0.3

## 2023-05-27 LAB — BASIC METABOLIC PANEL
BUN: 11 (ref 4–21)
CO2: 33 — AB (ref 13–22)
Chloride: 104 (ref 99–108)
Creatinine: 0.5 (ref 0.5–1.1)
Glucose: 92
Potassium: 3 meq/L — AB (ref 3.5–5.1)
Sodium: 139 (ref 137–147)

## 2023-05-27 LAB — COMPREHENSIVE METABOLIC PANEL
Albumin: 3.1 — AB (ref 3.5–5.0)
Calcium: 10.4 (ref 8.7–10.7)

## 2023-05-27 MED ORDER — FULVESTRANT 250 MG/5ML IM SOSY
500.0000 mg | PREFILLED_SYRINGE | Freq: Once | INTRAMUSCULAR | Status: AC
  Administered 2023-05-27: 500 mg via INTRAMUSCULAR
  Filled 2023-05-27: qty 10

## 2023-05-27 NOTE — Patient Instructions (Signed)

## 2023-05-29 ENCOUNTER — Inpatient Hospital Stay: Payer: BC Managed Care – PPO | Admitting: Oncology

## 2023-05-29 ENCOUNTER — Encounter: Payer: Self-pay | Admitting: Oncology

## 2023-05-29 VITALS — BP 111/80 | HR 103 | Temp 98.4°F | Resp 18 | Ht <= 58 in | Wt 129.8 lb

## 2023-05-29 DIAGNOSIS — C50412 Malignant neoplasm of upper-outer quadrant of left female breast: Secondary | ICD-10-CM

## 2023-05-29 DIAGNOSIS — C7951 Secondary malignant neoplasm of bone: Secondary | ICD-10-CM | POA: Diagnosis not present

## 2023-05-29 DIAGNOSIS — Z170 Estrogen receptor positive status [ER+]: Secondary | ICD-10-CM | POA: Diagnosis not present

## 2023-05-29 NOTE — Progress Notes (Signed)
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 She was on adjuvant hormonal therapy with tamoxifen 20 mg daily from October 2015 to May 2020, but due to elevation of the liver transaminases, was switched to anastrazole.   Malignant neoplasm metastatic to bone [facility]) New bone metastases, November 2022, with multiple marrow replacing lesions within the proximal right femur within the ischium as well as the inferior rami. 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 Ki67 was 60%. PET imaging from December 19th revealed dominant finding of intensely hypermetabolic aggressive skeletal metastasis involving the calvarium, sternum, thoracic and lumbar spine, and pelvis, with potential pathologic fracture of the right femur with internal fixation. There is soft tissue extension into the anterior mediastinum associated with the manubrial metastasis. Multiple spinal vertebral body lesions have cortical destruction along the posterior margin. She started monthly Xgeva in December, and completed radiation therapy. She was receiving THP but cycle 4 was delayed and dose reduction made by 20% due to left leg skin infection. She continued with Herceptin/Perjeta until she had progression of disease. We then  switched her to [facility] chemotherapy, but she has had significant toxicities of nausea, vomiting, diarrhea, and hypokalemia. These have been slowly improving and we will re scan her next time to assess her response. She was also placed on hormonal therapy with Faslodex injections. The scans from July 22nd show improvement with stable lesions of the bone. Repeat scans on 05/27/2023 remain stable with extensive mixed lytic and sclerotic skeletal metastases.    Increased Left Hip Pain In January 2024, she developed increased pain of the left hip. She was found to have innumerable metastatic lesions throughout the pelvis and proximal femur. There is a non displaced pathologic fracture through a large lesion in the superior left acetabulum. She was referred to an orthopedic oncologist at [facility] and they recommended radiation and no surgery. She has now received her palliative radiation. She followed-up with [facility] in April and they do not recommend surgery but will continue surveillance.    Hypokalemia This was severe due to intolerance to oral supplement and persistent nausea, vomiting, and diarrhea. She is supposed to be taking potassium at TID but is unable tolerate. Her potassium is down to 3.0 today. I prescribed IV potassium today 30 Meq and she will have another 30 meq IV potassium on 05/31/2023, before she has her chemotherapy. I have instructed her to increase her oral potassium to TID  as tolerated.    Hypocalcemia Her calcium is better but this was with taking 6 tablets daily.  Her vitamin D level was adequate.  I had recommended she change her calcium supplement to 1 tablet TID but now her level is high.  I will have her stop the 1 supplement daily.    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 She was found to be iron deficient in September 2023  and was given IV iron supplement. Her hemoglobin has fluctuated up and down but is currently stable at  11.0.    Abnormal Mammogram She does have thickening of the skin and trabecular thickening of the left breast. I find no significant change on physical exam and feel that a lot of this can be post radiation change. In view of her widespread bone metastases, I will not pursue further evaluation but will continue periodic breast exams.  On CT scan this nodule of the left breast appears to be 12 mm, down from 13 mm in size   Large Cyst of the Right Kidney This arises from the lower pole and has been present for a long time, previously measuring 11 mm on last scan from September, 2023. During her CT simulation for her left hip radiation, the physicist noted that the cyst is enlarged and so we did re-evaluate.  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  For now we will continue the Enhertu and Faslodex but keep this in mind for the future.  Recurrent leg ulcers Currently resolved but she has a new small blister she and her husband will monitor this closely.  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   Plan:  She had a CT neck done on 05/27/2023 that revealed no soft tissue metastatic disease identified in the neck and extensive mixed lytic and sclerotic skeletal metastases much more pronounced in the visible upper thorax than in the cervical spine and grossly stable from July, 2024. She will have her Xgeva injection done on 05/31/2023. Her day 1 cycle 13 of Enhertu is scheduled on 06/21/2023. Her WBC is 4.4, hemoglobin is stable but low at 11.0, and platelet count is 279,000 as of today. Her CMP is normal other than an elevated calcium of 10.4 corrected, low potassium of 3.0 and low total protein of 5.5. She informed me that she is  drinking more milk and takes 1 oral calcium supplement a day. I advised her to stop taking her calcium supplement. She also takes 1 oral potassium supplement daily. I will order of IV potassium. I informed her that she will need to take about 2-3 potassium supplements a day as tolerated, and increase her protein intake. She inquired whether she can get her teeth cleaned and I informed her that she is ok to have dental work and I have prescribed an antibiotic to take before the appointment. I will see her back in 3 weeks with CBC and CMP. The patient understands the plans discussed today and is in agreement with them.  She knows to contact our office if she develops concerns prior to her next appointment.    I provided 13 minutes of face-to-face time during this encounter and > 50% was spent counseling as documented under my assessment and plan.     Dellia Beckwith, MD  [facility] AT [facility] [address] [place] [address] Dept: [phone] Dept Fax: [phone]   No orders of the defined types were placed in this encounter.    CHIEF COMPLAINT:  CC: A 52 year old female with breast cancer, metastatic to bone and nodes  Current Treatment:  Enhertu and  Rivka Barbara  INTERVAL HISTORY:  Anna [name] is here today for repeat clinical assessment for her breast cancer metastatic to bone.  Her Guardant360 results came back and revealed her to have a PIK3CA mutation. We will continue the Enhertu for now. She completed radiation at the left hip on 12/25/2022. Patient states that she feels ok but complains of lower back pain rating a 4/10. She had a CT neck done on 05/27/2023 that revealed no soft tissue metastatic disease identified in the neck and extensive mixed lytic and sclerotic skeletal metastases much more pronounced in the visible upper thorax than in the cervical spine and grossly stable from July, 2024. She will have her Xgeva injection  done on 05/31/2023. Her day 1 cycle 13 of Enhertu is scheduled on 06/21/2023. Her WBC is 4.4, hemoglobin is stable but low at 11.0, and platelet count is 279,000 as of today. Her CMP is normal other than an elevated calcium of 10.4 corrected, low potassium of 3.0 and low total protein of 5.5. She informed me that she is drinking more milk and takes 1 oral calcium supplement a day. I advised her to stop taking her calcium supplement. She also takes 1 oral potassium supplement daily. I will order of IV potassium. I informed her that she will need to take about 2-3 potassium supplements a day as tolerated, and increase her protein intake. She inquired  whether she can get her teeth cleaned and I informed her that she is ok to have dental work and I have prescribed an antibiotic to take before the appointment. I will see her back in 3 weeks with CBC and CMP. She denies signs of infection such as sore throat, sinus drainage, cough, or urinary symptoms.  She denies fevers or recurrent chills. She denies pain. She denies nausea, vomiting, chest pain, dyspnea or cough. Her appetite is good and her weight has decreased 2 pounds over last 2.5 weeks .She is accompanied today by her husband.      I have reviewed the past medical history, past surgical history, social history and family history with the patient and they are unchanged from previous note.  ALLERGIES:  is allergic to reglan [metoclopramide].  MEDICATIONS:  Current Outpatient Medications  Medication Sig Dispense Refill   albuterol (VENTOLIN HFA) 108 (90 Base) MCG/ACT inhaler Inhale 2 puffs into the lungs every 6 (six) hours as needed for shortness of breath.     ARIPiprazole (ABILIFY) 5 MG tablet Take 2.5 mg by mouth daily.     aspirin 81 MG EC tablet Take 81 mg by mouth daily. Swallow whole.     atorvastatin (LIPITOR) 20 MG tablet TAKE ONE TABLET BY MOUTH EVERY DAY 90 tablet 0   augmented betamethasone dipropionate (DIPROLENE-AF) 0.05 % cream       dapagliflozin propanediol (FARXIGA) 10 MG TABS tablet TAKE ONE TABLET BY MOUTH EVERY DAY 30 tablet 10   desvenlafaxine (PRISTIQ) 50 MG 24 hr tablet Take 50 mg by mouth daily.     dexamethasone (DECADRON) 4 MG tablet Take 1 tablet (4 mg total) by mouth 2 (two) times daily. For 5 days after chemotherapy 20 tablet 2   diphenoxylate-atropine (LOMOTIL) 2.5-0.025 MG tablet TAKE TWO TABLETS BY MOUTH FOUR TIMES A DAY AS NEEDED FOR DIARRHEA OR LOOSE STOOLS 100 tablet 1   labetalol (NORMODYNE) 100 MG tablet Take 1 tablet (100 mg total) by mouth 2 (two) times daily. 180 tablet 3   loratadine (CLARITIN) 10 MG tablet TAKE ONE TABLET BY MOUTH DAILY  30 tablet 2   LORazepam (ATIVAN) 1 MG tablet TAKE ONE TABLET BY MOUTH EVERY 8 HOURS 30 tablet 1   magic mouthwash (lidocaine, diphenhydrAMINE, alum & mag hydroxide) suspension Take 5 mLs by mouth every 3 (three) hours as needed. Swish and spit; for throat/mouth pain     naloxone (NARCAN) nasal spray 4 mg/0.1 mL One spray in nostril as needed, may repeat every 2 to 3 minutes until medical assistance becomes available 4 each 1   nitroGLYCERIN (NITROSTAT) 0.4 MG SL tablet Place 0.4 mg under the tongue every 5 (five) minutes as needed for chest pain.     nystatin (MYCOSTATIN/NYSTOP) powder Apply 1 Application topically 2 (two) times daily.     omeprazole (PRILOSEC) 40 MG capsule Take 1 capsule (40 mg total) by mouth at bedtime. 30 capsule 5   ondansetron (ZOFRAN) 8 MG tablet Take 1 tablet (8 mg total) by mouth every 8 (eight) hours as needed for nausea or vomiting. 60 tablet 5   Oxycodone HCl 10 MG TABS Take 1 tablet (10 mg total) by mouth every 4 (four) hours as needed. for pain 180 tablet 0   Potassium Chloride 10 % SOLN Take 20 mEq by mouth 2 (two) times daily. 900 mL 2   prochlorperazine (COMPAZINE) 10 MG tablet TAKE ONE TABLET BY MOUTH EVERY 6 HOURS AS NEEDED FOR NAUSEA/VOMITING 90 tablet 3   Current Facility-Administered Medications  Medication Dose Route  Frequency Provider Last Rate Last Admin   sodium chloride flush (NS) 0.9 % injection 10 mL  10 mL Intravenous Q12H Dellia Beckwith, MD       technetium sestamibi generic (CARDIOLITE) injection 10.5 millicurie  10.5 millicurie Intravenous Once PRN Revankar, Aundra Dubin, MD        HISTORY OF PRESENT ILLNESS:   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 (HCC)   07/23/2013 Cancer Staging   Staging form: Breast, AJCC 7th Edition - Clinical stage from 07/23/2013: Stage IIB (T2, N1, M0) - Signed by Dellia Beckwith, MD on 07/04/2020 Prognostic indicators: Pos LVI   09/22/2021 - 04/06/2022 Chemotherapy   Patient is on Treatment Plan : BREAST  Trastuzumab + Pertuzumab q21d      09/22/2021 - 09/21/2022 Chemotherapy   Patient is on Treatment Plan : BREAST Trastuzumab + Pertuzumab q21d     10/12/2022 -  Chemotherapy   Patient is on Treatment Plan : BREAST METASTATIC Fam-Trastuzumab Deruxtecan-nxki (Enhertu) (5.4) q21d     Malignant neoplasm metastatic to bone (HCC)  06/28/2021 Initial Diagnosis   Bone metastases (HCC)   09/22/2021 - 04/06/2022 Chemotherapy   Patient is on Treatment Plan : BREAST  Trastuzumab + Pertuzumab q21d      09/22/2021 - 09/21/2022 Chemotherapy   Patient is on Treatment Plan : BREAST Trastuzumab + Pertuzumab q21d     05/11/2022 Imaging   CT chest, abdomen and pelvis:  IMPRESSION:  1. Widespread bony metastatic disease shows increased sclerosis  since previous imaging. This is likely related to response to  therapy in the interval. There is a single lesion along the LEFT  iliac which shows continued lytic change and warrants attention on  follow-up  2. Interval development of pathologic fractures at in the upper  thoracic spine and at the thoracolumbar junction with associated  kyphosis.  3. No signs of solid organ or nodal  metastatic disease.  4. Marked elevation of  the LEFT hemidiaphragm as on the prior PET.  5. Aortic atherosclerosis.    10/12/2022 -  Chemotherapy   Patient is on Treatment Plan : BREAST METASTATIC Fam-Trastuzumab Deruxtecan-nxki (Enhertu) (5.4) q21d         REVIEW OF SYSTEMS:  Review of Systems  Constitutional: Negative.  Negative for appetite change, chills, diaphoresis, fatigue, fever and unexpected weight change.  HENT:  Negative.  Negative for hearing loss, lump/mass, mouth sores, nosebleeds, sore throat, tinnitus, trouble swallowing and voice change.   Eyes: Negative.  Negative for eye problems and icterus.  Respiratory: Negative.  Negative for chest tightness, cough, hemoptysis, shortness of breath and wheezing.   Cardiovascular: Negative.  Negative for chest pain, leg swelling and palpitations.  Gastrointestinal:  Positive for constipation. Negative for abdominal distention, abdominal pain, blood in stool, diarrhea, nausea (improved), rectal pain and vomiting.  Endocrine: Negative.   Genitourinary: Negative.  Negative for bladder incontinence, difficulty urinating, dyspareunia, dysuria, frequency, hematuria, menstrual problem, nocturia, pelvic pain, vaginal bleeding and vaginal discharge.   Musculoskeletal:  Positive for back pain (lower back pain, 4/10). Negative for arthralgias, flank pain, gait problem, myalgias, neck pain and neck stiffness.  Skin:  Negative for itching, rash and wound.  Neurological: Negative.  Negative for dizziness, extremity weakness, gait problem, headaches, light-headedness, numbness, seizures and speech difficulty.  Hematological: Negative.  Negative for adenopathy. Does not bruise/bleed easily.  Psychiatric/Behavioral: Negative.  Negative for confusion, decreased concentration, depression, sleep disturbance and suicidal ideas. The patient is not nervous/anxious.    VITALS:  Blood pressure 111/80, pulse (!) 103, temperature 98.4 F (36.9 C), temperature  source Oral, resp. rate 18, height 4\' 10"  (1.473 m), weight 129 lb 12.8 oz (58.9 kg), last menstrual period 08/18/2013, SpO2 100%.  Wt Readings from Last 3 Encounters:  06/03/23 129 lb 0.6 oz (58.5 kg)  05/31/23 129 lb (58.5 kg)  05/29/23 129 lb 12.8 oz (58.9 kg)    Body mass index is 27.13 kg/m.  Performance status (ECOG): 1 - Symptomatic but completely ambulatory  PHYSICAL EXAM:  Physical Exam Vitals and nursing note reviewed. Exam conducted with a chaperone present.  Constitutional:      General: She is not in acute distress.    Appearance: Normal appearance. She is normal weight. She is not ill-appearing, toxic-appearing or diaphoretic.  HENT:     Head: Normocephalic and atraumatic. Hair is abnormal (partial alopecia).     Right Ear: Tympanic membrane, ear canal and external ear normal. There is no impacted cerumen.     Left Ear: Tympanic membrane, ear canal and external ear normal. There is no impacted cerumen.     Nose: Nose normal. No congestion or rhinorrhea.     Mouth/Throat:     Mouth: Mucous membranes are moist.     Pharynx: Oropharynx is clear. No oropharyngeal exudate or posterior oropharyngeal erythema.  Eyes:     General: No scleral icterus.       Right eye: No discharge.        Left eye: No discharge.     Extraocular Movements: Extraocular movements intact.     Conjunctiva/sclera: Conjunctivae normal.     Pupils: Pupils are equal, round, and reactive to light.  Neck:     Vascular: No carotid bruit.     Comments: Chronic flexion of the neck. Cardiovascular:     Rate and Rhythm: Normal rate and regular rhythm.     Pulses: Normal pulses.     Heart sounds: Normal heart sounds. No murmur heard.  No friction rub. No gallop.  Pulmonary:     Effort: Pulmonary effort is normal. No respiratory distress.     Breath sounds: Normal breath sounds. No stridor or decreased air movement. No wheezing, rhonchi or rales.  Chest:     Chest wall: No tenderness.  Abdominal:      General: Bowel sounds are normal. There is no distension.     Palpations: Abdomen is soft. There is no hepatomegaly, splenomegaly or mass.     Tenderness: There is no abdominal tenderness. There is no right CVA tenderness, left CVA tenderness, guarding or rebound.     Hernia: No hernia is present.  Musculoskeletal:        General: No swelling, tenderness, deformity or signs of injury. Normal range of motion.     Cervical back: Normal range of motion and neck supple. No rigidity or tenderness.     Right lower leg: No edema.     Left lower leg: No edema.     Comments: She has compression hose in place and she mentions a small blister in the left medial ankle  Lymphadenopathy:     Cervical: No cervical adenopathy.     Right cervical: No superficial, deep or posterior cervical adenopathy.    Left cervical: No superficial, deep or posterior cervical adenopathy.     Upper Body:     Right upper body: No supraclavicular, axillary or pectoral adenopathy.     Left upper body: No supraclavicular, axillary or pectoral adenopathy.  Skin:    General: Skin is warm and dry.     Coloration: Skin is not jaundiced or pale.     Findings: No bruising, erythema, lesion or rash.  Neurological:     General: No focal deficit present.     Mental Status: She is alert and oriented to person, place, and time. Mental status is at baseline.     Cranial Nerves: No cranial nerve deficit.     Sensory: No sensory deficit.     Motor: No weakness.     Coordination: Coordination normal.     Gait: Gait normal.     Deep Tendon Reflexes: Reflexes normal.  Psychiatric:        Mood and Affect: Mood normal.        Behavior: Behavior normal.        Thought Content: Thought content normal.        Judgment: Judgment normal.    LABORATORY DATA:  I have reviewed the data as listed    Component Value Date/Time   NA 139 05/27/2023 0000   K 3.0 (A) 05/27/2023 0000   CL 104 05/27/2023 0000   CO2 33 (A) 05/27/2023 0000    GLUCOSE 103 (H) 05/06/2023 1407   BUN 11 05/27/2023 0000   CREATININE 0.5 05/27/2023 0000   CREATININE 0.62 05/06/2023 1407   CALCIUM 10.4 05/27/2023 0000   PROT 6.1 (L) 05/06/2023 1407   ALBUMIN 3.1 (A) 05/27/2023 0000   AST 22 05/27/2023 0000   AST 18 05/06/2023 1407   ALT 18 05/27/2023 0000   ALT 19 05/06/2023 1407   ALKPHOS 62 05/27/2023 0000   BILITOT 0.4 05/06/2023 1407   GFRNONAA >60 05/06/2023 1407   No results found for: "SPEP", "UPEP"  Lab Results  Component Value Date   WBC 4.4 05/27/2023   NEUTROABS 2.95 05/27/2023   HGB 11.0 (A) 05/27/2023   HCT 33 (A) 05/27/2023   MCV 96.6 05/06/2023   PLT 279 05/27/2023  Chemistry      Component Value Date/Time   NA 139 05/27/2023 0000   K 3.0 (A) 05/27/2023 0000   CL 104 05/27/2023 0000   CO2 33 (A) 05/27/2023 0000   BUN 11 05/27/2023 0000   CREATININE 0.5 05/27/2023 0000   CREATININE 0.62 05/06/2023 1407   GLU 92 05/27/2023 0000      Component Value Date/Time   CALCIUM 10.4 05/27/2023 0000   ALKPHOS 62 05/27/2023 0000   AST 22 05/27/2023 0000   AST 18 05/06/2023 1407   ALT 18 05/27/2023 0000   ALT 19 05/06/2023 1407   BILITOT 0.4 05/06/2023 1407     Component Ref Range & Units 3 wk ago (05/06/23) 1 mo ago (04/12/23) 2 mo ago (03/25/23) 3 mo ago (02/12/23) 3 mo ago (02/01/23) 4 mo ago (01/22/23) 4 mo ago (01/14/23)  Magnesium 1.7 - 2.4 mg/dL 2.0 1.9 CM 1.6 Low  CM 1.9 CM 1.90 R 1.7 CM 2.0 CM   Component Ref Range & Units 2 wk ago (11/21/22) 2 mo ago (10/09/22) 3 mo ago (08/29/22) 5 mo ago (06/28/22) 1 yr ago (09/06/21) 1 yr ago (07/20/21)  CA 27.29 0.0 - 38.6 U/mL 46.2 High  53.8 High  CM 57.7 High  CM 50.9 High  CM 106.6 High  CM 75.6 High  C     RADIOGRAPHIC STUDIES: Exam: 05/27/2023 CT Neck with Contrast Impression: No soft tissue metastatic disease identified in the neck. Extensive mixed lytic and sclerotic skeletal metastases, much more pronounced in the visible upper thorax than in the cervical  spine and grossly stable from a July Chest CT.   EXAM: 03/01/2023 CT CHEST, ABDOMEN AND PELVIS IMPRESSION: Unchanged mass in the lateral left breast measuring 1.3 x 1.1 cm and overlying skin thickening of the left breast  Diminished soft tissue thickening about the left subclavian structures and thoracic inlet, presumably reflecting treatment response of infiltrative soft tissue metastatic disease.  Unchanged, widespread mixed lytic and sclerotic osseous metastatic disease throughout, partially notable for pathologic wedge deformities of T12 and l1 status post vertebral cement augmentation, as well as untreated pathologic wedge deformities of T3 and T4, with multiple additional pathologic endplate deformities.  No evidence of lymphadenopathy or organ metastatic disease in the chest, abdomen or pelvis.  Unchanged, severe elevation of the left hemidiaphragm with associated scarring or atelectasis  Coronary artery disease.     Echocardiogram: 10/18/2022 IMPRESSIONS:  1. TDS, decrease LVEF as compared to echo from 07/16/2022. Left  ventricular ejection fraction, by estimation, is 50 to 55%. The left  ventricle has low normal function. The left ventricle has no regional wall  motion abnormalities. Left ventricular  diastolic parameters are consistent with Grade I diastolic dysfunction  (impaired relaxation).   2. Right ventricular systolic function is normal. The right ventricular  size is normal. There is normal pulmonary artery systolic pressure.   3. The mitral valve is normal in structure. No evidence of mitral valve  regurgitation. No evidence of mitral stenosis.   4. The aortic valve is normal in structure. Aortic valve regurgitation is  not visualized. No aortic stenosis is present.   5. The inferior vena cava is normal in size with greater than 50%  respiratory variability, suggesting right atrial pressure of 3 mmHg.         I,Jasmine M Lassiter,acting as a scribe for  Dellia Beckwith, MD.,have documented all relevant documentation on the behalf of Dellia Beckwith, MD,as directed by  Dellia Beckwith, MD  while in the presence of Dellia Beckwith, MD.

## 2023-05-30 MED FILL — Dexamethasone Sodium Phosphate Inj 100 MG/10ML: INTRAMUSCULAR | Qty: 1 | Status: AC

## 2023-05-30 MED FILL — Fosaprepitant Dimeglumine For IV Infusion 150 MG (Base Eq): INTRAVENOUS | Qty: 5 | Status: AC

## 2023-05-31 ENCOUNTER — Other Ambulatory Visit: Payer: Self-pay | Admitting: Pharmacist

## 2023-05-31 ENCOUNTER — Inpatient Hospital Stay: Payer: BC Managed Care – PPO

## 2023-05-31 ENCOUNTER — Encounter: Payer: Self-pay | Admitting: Oncology

## 2023-05-31 VITALS — BP 100/69 | HR 93 | Temp 98.4°F | Resp 18 | Wt 129.0 lb

## 2023-05-31 DIAGNOSIS — C7951 Secondary malignant neoplasm of bone: Secondary | ICD-10-CM

## 2023-05-31 DIAGNOSIS — Z5111 Encounter for antineoplastic chemotherapy: Secondary | ICD-10-CM | POA: Diagnosis not present

## 2023-05-31 DIAGNOSIS — Z170 Estrogen receptor positive status [ER+]: Secondary | ICD-10-CM

## 2023-05-31 MED ORDER — HEPARIN SOD (PORK) LOCK FLUSH 100 UNIT/ML IV SOLN
500.0000 [IU] | Freq: Once | INTRAVENOUS | Status: AC | PRN
  Administered 2023-05-31: 500 [IU]

## 2023-05-31 MED ORDER — SODIUM CHLORIDE 0.9% FLUSH
10.0000 mL | INTRAVENOUS | Status: DC | PRN
  Administered 2023-05-31: 10 mL

## 2023-05-31 MED ORDER — POTASSIUM CHLORIDE 10 MEQ/100ML IV SOLN: 10.0000 meq | INTRAVENOUS | Status: DC

## 2023-05-31 MED ORDER — PALONOSETRON HCL INJECTION 0.25 MG/5ML
0.2500 mg | Freq: Once | INTRAVENOUS | Status: AC
  Administered 2023-05-31: 0.25 mg via INTRAVENOUS
  Filled 2023-05-31: qty 5

## 2023-05-31 MED ORDER — DEXTROSE 5 % IV SOLN: Freq: Once | INTRAVENOUS | Status: AC

## 2023-05-31 MED ORDER — DENOSUMAB 120 MG/1.7ML ~~LOC~~ SOLN
120.0000 mg | Freq: Once | SUBCUTANEOUS | Status: AC
  Administered 2023-05-31: 120 mg via SUBCUTANEOUS
  Filled 2023-05-31: qty 1.7

## 2023-05-31 MED ORDER — SODIUM CHLORIDE 0.9 % IV SOLN
150.0000 mg | Freq: Once | INTRAVENOUS | Status: AC
  Administered 2023-05-31: 150 mg via INTRAVENOUS
  Filled 2023-05-31: qty 150

## 2023-05-31 MED ORDER — FAM-TRASTUZUMAB DERUXTECAN-NXKI CHEMO 100 MG IV SOLR
5.4000 mg/kg | Freq: Once | INTRAVENOUS | Status: AC
  Administered 2023-05-31: 340 mg via INTRAVENOUS
  Filled 2023-05-31: qty 17

## 2023-05-31 MED ORDER — ACETAMINOPHEN 325 MG PO TABS: 650.0000 mg | ORAL_TABLET | Freq: Once | ORAL | Status: DC

## 2023-05-31 MED ORDER — DIPHENHYDRAMINE HCL 25 MG PO CAPS: 50.0000 mg | ORAL_CAPSULE | Freq: Once | ORAL | Status: DC

## 2023-05-31 MED ORDER — SODIUM CHLORIDE 0.9 % IV SOLN: Freq: Once | INTRAVENOUS | Status: DC

## 2023-05-31 MED ORDER — SODIUM CHLORIDE 0.9 % IV SOLN
10.0000 mg | Freq: Once | INTRAVENOUS | Status: AC
  Administered 2023-05-31: 10 mg via INTRAVENOUS
  Filled 2023-05-31: qty 10

## 2023-05-31 NOTE — Patient Instructions (Signed)
Fam-Trastuzumab Deruxtecan Injection What is this medication? FAM-TRASTUZUMAB DERUXTECAN (fam-tras TOOZ eu mab DER ux TEE kan) treats some types of cancer. It works by blocking a protein that causes cancer cells to grow and multiply. This helps to slow or stop the spread of cancer cells. This medicine may be used for other purposes; ask your health care provider or pharmacist if you have questions. COMMON BRAND NAME(S): ENHERTU What should I tell my care team before I take this medication? They need to know if you have any of these conditions: Heart disease Heart failure Infection, especially a viral infection, such as chickenpox, cold sores, or herpes Liver disease Lung or breathing disease, such as asthma or COPD An unusual or allergic reaction to fam-trastuzumab deruxtecan, other medications, foods, dyes, or preservatives Pregnant or trying to get pregnant Breast-feeding How should I use this medication? This medication is injected into a vein. It is given by your care team in a hospital or clinic setting. A special MedGuide will be given to you before each treatment. Be sure to read this information carefully each time. Talk to your care team about the use of this medication in children. Special care may be needed. Overdosage: If you think you have taken too much of this medicine contact a poison control center or emergency room at once. NOTE: This medicine is only for you. Do not share this medicine with others. What if I miss a dose? It is important not to miss your dose. Call your care team if you are unable to keep an appointment. What may interact with this medication? Interactions are not expected. This list may not describe all possible interactions. Give your health care provider a list of all the medicines, herbs, non-prescription drugs, or dietary supplements you use. Also tell them if you smoke, drink alcohol, or use illegal drugs. Some items may interact with your  medicine. What should I watch for while using this medication? Visit your care team for regular checks on your progress. Tell your care team if your symptoms do not start to get better or if they get worse. Your condition will be monitored carefully while you are receiving this medication. Do not become pregnant while taking this medication or for 7 months after stopping it. Women should inform their care team if they wish to become pregnant or think they might be pregnant. Men should not father a child while taking this medication and for 4 months after stopping it. There is potential for serious side effects to an unborn child. Talk to your care team for more information. Do not breast-feed an infant while taking this medication or for 7 months after the last dose. This medication has caused decreased sperm counts in some men. This may make it more difficult to father a child. Talk to your care team if you are concerned about your fertility. This medication may increase your risk to bruise or bleed. Call your care team if you notice any unusual bleeding. Be careful brushing or flossing your teeth or using a toothpick because you may get an infection or bleed more easily. If you have any dental work done, tell your dentist you are receiving this medication. This medication may cause dry eyes and blurred vision. If you wear contact lenses, you may feel some discomfort. Lubricating eye drops may help. See your care team if the problem does not go away or is severe. This medication may increase your risk of getting an infection. Call your care team for   advice if you get a fever, chills, sore throat, or other symptoms of a cold or flu. Do not treat yourself. Try to avoid being around people who are sick. Avoid taking medications that contain aspirin, acetaminophen, ibuprofen, naproxen, or ketoprofen unless instructed by your care team. These medications may hide a fever. What side effects may I notice from  receiving this medication? Side effects that you should report to your care team as soon as possible: Allergic reactions--skin rash, itching, hives, swelling of the face, lips, tongue, or throat Dry cough, shortness of breath or trouble breathing Infection--fever, chills, cough, sore throat, wounds that don't heal, pain or trouble when passing urine, general feeling of discomfort or being unwell Heart failure--shortness of breath, swelling of the ankles, feet, or hands, sudden weight gain, unusual weakness or fatigue Unusual bruising or bleeding Side effects that usually do not require medical attention (report these to your care team if they continue or are bothersome): Constipation Diarrhea Hair loss Muscle pain Nausea Vomiting This list may not describe all possible side effects. Call your doctor for medical advice about side effects. You may report side effects to FDA at 1-800-FDA-1088. Where should I keep my medication? This medication is given in a hospital or clinic. It will not be stored at home. NOTE: This sheet is a summary. It may not cover all possible information. If you have questions about this medicine, talk to your doctor, pharmacist, or health care provider.  2024 [employer]/Gold Standard (2021-05-16 00:00:00)  

## 2023-05-31 NOTE — Progress Notes (Signed)
Patient requests a separate appt for KCL runs- Scheduled for Monday am and patient aware

## 2023-06-03 ENCOUNTER — Inpatient Hospital Stay: Payer: BC Managed Care – PPO

## 2023-06-03 VITALS — BP 127/74 | HR 94 | Temp 98.0°F | Resp 18 | Ht <= 58 in | Wt 129.0 lb

## 2023-06-03 DIAGNOSIS — C7951 Secondary malignant neoplasm of bone: Secondary | ICD-10-CM

## 2023-06-03 DIAGNOSIS — Z5111 Encounter for antineoplastic chemotherapy: Secondary | ICD-10-CM | POA: Diagnosis not present

## 2023-06-03 MED ORDER — HEPARIN SOD (PORK) LOCK FLUSH 100 UNIT/ML IV SOLN
250.0000 [IU] | Freq: Once | INTRAVENOUS | Status: AC | PRN
  Administered 2023-06-03: 250 [IU]

## 2023-06-03 MED ORDER — POTASSIUM CHLORIDE 10 MEQ/100ML IV SOLN
10.0000 meq | INTRAVENOUS | Status: AC
  Administered 2023-06-03 (×3): 10 meq via INTRAVENOUS
  Filled 2023-06-03 (×3): qty 100

## 2023-06-03 MED ORDER — SODIUM CHLORIDE 0.9% FLUSH: 3.0000 mL | Freq: Once | INTRAVENOUS | Status: DC | PRN

## 2023-06-03 MED ORDER — SODIUM CHLORIDE 0.9 % IV SOLN: INTRAVENOUS | Status: DC

## 2023-06-03 MED ORDER — SODIUM CHLORIDE 0.9% FLUSH
10.0000 mL | INTRAVENOUS | Status: DC | PRN
  Administered 2023-06-03: 10 mL via INTRAVENOUS

## 2023-06-03 NOTE — Patient Instructions (Signed)
Potassium Chloride Injection What is this medication? POTASSIUM CHLORIDE (poe TASS i um KLOOR ide) prevents and treats low levels of potassium in your body. Potassium plays an important role in maintaining the health of your kidneys, heart, muscles, and nervous system. This medicine may be used for other purposes; ask your health care provider or pharmacist if you have questions. COMMON BRAND NAME(S): PROAMP What should I tell my care team before I take this medication? They need to know if you have any of these conditions: Addison disease Dehydration Diabetes (high blood sugar) Heart disease High levels of potassium in the blood Irregular heartbeat or rhythm Kidney disease Large areas of burned skin An unusual or allergic reaction to potassium, other medications, foods, dyes, or preservatives Pregnant or trying to get pregnant Breast-feeding How should I use this medication? This medication is injected into a vein. It is given in a hospital or clinic setting. Talk to your care team about the use of this medication in children. Special care may be needed. Overdosage: If you think you have taken too much of this medicine contact a poison control center or emergency room at once. NOTE: This medicine is only for you. Do not share this medicine with others. What if I miss a dose? This does not apply. This medication is not for regular use. What may interact with this medication? Do not take this medication with any of the following: Certain diuretics, such as spironolactone, triamterene Eplerenone Sodium polystyrene sulfonate This medication may also interact with the following: Certain medications for blood pressure or heart disease, such as lisinopril, losartan, quinapril, valsartan Medications that lower your chance of fighting infection, such as cyclosporine, tacrolimus NSAIDs, medications for pain and inflammation, such as ibuprofen or naproxen Other potassium supplements Salt  substitutes This list may not describe all possible interactions. Give your health care provider a list of all the medicines, herbs, non-prescription drugs, or dietary supplements you use. Also tell them if you smoke, drink alcohol, or use illegal drugs. Some items may interact with your medicine. What should I watch for while using this medication? Visit your care team for regular checks on your progress. Tell your care team if your symptoms do not start to get better or if they get worse. You may need blood work while you are taking this medication. Avoid salt substitutes unless you are told otherwise by your care team. What side effects may I notice from receiving this medication? Side effects that you should report to your care team as soon as possible: Allergic reactions--skin rash, itching, hives, swelling of the face, lips, tongue, or throat High potassium level--muscle weakness, fast or irregular heartbeat Side effects that usually do not require medical attention (report to your care team if they continue or are bothersome): Diarrhea Nausea Stomach pain Vomiting This list may not describe all possible side effects. Call your doctor for medical advice about side effects. You may report side effects to FDA at 1-800-FDA-1088. Where should I keep my medication? This medication is given in a hospital or clinic. It will not be stored at home. NOTE: This sheet is a summary. It may not cover all possible information. If you have questions about this medicine, talk to your doctor, pharmacist, or health care provider.  2024 [employer] (2022-02-09 00:00:00)

## 2023-06-07 ENCOUNTER — Ambulatory Visit: Payer: BC Managed Care – PPO | Attending: Oncology

## 2023-06-07 DIAGNOSIS — C7951 Secondary malignant neoplasm of bone: Secondary | ICD-10-CM | POA: Diagnosis not present

## 2023-06-07 DIAGNOSIS — Z0189 Encounter for other specified special examinations: Secondary | ICD-10-CM

## 2023-06-07 LAB — ECHOCARDIOGRAM COMPLETE
Calc EF: 53.9 %
Est EF: 55
S' Lateral: 2.5 cm
Single Plane A2C EF: 54.6 %
Single Plane A4C EF: 55.9 %

## 2023-06-11 ENCOUNTER — Encounter: Payer: Self-pay | Admitting: Oncology

## 2023-06-11 MED ORDER — SODIUM CHLORIDE 0.9% FLUSH: 10.0000 mL | Freq: Two times a day (BID) | INTRAVENOUS | Status: DC

## 2023-06-14 ENCOUNTER — Other Ambulatory Visit: Payer: Self-pay | Admitting: Hematology and Oncology

## 2023-06-14 DIAGNOSIS — S22078A Other fracture of T9-T10 vertebra, initial encounter for closed fracture: Secondary | ICD-10-CM

## 2023-06-14 DIAGNOSIS — C7951 Secondary malignant neoplasm of bone: Secondary | ICD-10-CM

## 2023-06-14 DIAGNOSIS — M545 Low back pain, unspecified: Secondary | ICD-10-CM

## 2023-06-14 DIAGNOSIS — R0789 Other chest pain: Secondary | ICD-10-CM

## 2023-06-18 NOTE — Progress Notes (Signed)
[facility]  [address] [place],  [place]  [address] [phone]  Clinic Day:  06/21/2023  Referring physician: Galvin Proffer, MD  ASSESSMENT & PLAN:   Assessment & Plan: Malignant neoplasm metastatic to bone [facility]) Bone metastases diagnosed in November 2022, with multiple marrow replacing lesions within the proximal right femur within the ischium as well as the inferior rami. 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 Ki67 was 60%. PET imaging in December revealed dominant finding of intensely hypermetabolic aggressive skeletal metastasis involving the calvarium, sternum, thoracic and lumbar spine, and pelvis, with potential pathologic fracture of the right femur with internal fixation. There was soft tissue extension into the anterior mediastinum associated with the manubrial metastasis. Multiple spinal vertebral body lesions had cortical destruction along the posterior margin. She started monthly Xgeva in December, and completed radiation therapy. She was receiving THP, but cycle 4 was delayed and dose reduction made by 20% due to left leg skin infection.   She continued with Herceptin/Perjeta until she had progression of disease. In January 2024, she developed increased pain of the left hip. She was found to have innumerable metastatic lesions throughout the pelvis and proximal femur. There is a non displaced pathologic fracture through a large lesion in the superior left acetabulum. She was referred to an orthopedic oncologist at [facility] and they recommended radiation and no surgery. She received her palliative radiation. She followed-up with [facility] in April and they do not recommend surgery, but will continue surveillance.   We then switched her to [facility] chemotherapy, but she has had significant toxicities of nausea, vomiting, diarrhea, and hypokalemia.  She continues  monthly Xgeva.  She was also placed on hormonal therapy with Faslodex injections.  Repeat CT imaging in July revealed decreased soft tissue thickening around the left subclavian structures and thoracic inlet.  Left lateral breast mass was stable.  Widespread mixed lytic and sclerotic osseous metastatic lesions were stable.  There was unchanged severe elevation of the left hemidiaphragm.   CT neck in October to evaluate for possible soft tissue mass and painful swallowing did not reveal any soft tissue masses.  The extensive mixed lytic and sclerotic skeletal metastases were much more pronounced in the visible upper thorax than in the cervical spine and were felt to be grossly stable from July 2024.   She continues Enhertu every 3 weeks.  Her pain remains fairly well-controlled on the current regimen.  She will proceed with 13th cycle today.  She will continue denosumab every 6 weeks, so will be due for that in 3 weeks. We will plan to see her back in 3 weeks, comprehensive metabolic panel and CT chest, abdomen and pelvis to reassess her disease baseline prior to Enhertu and denosumab.  Hypokalemia Persistent hypokalemia.  She states she is unable to tolerate liquid potassium due to the taste.  I recommended that she put it in some juice and hopefully this will be better tolerated.  She will receive IV potassium today.  Hypocalcemia Hypocalcemia resolved with oral calcium replacement.  She continues taking calcium twice daily.  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 She was found to be iron deficient in September 2023 and was given IV iron supplement. The hemoglobin has since fluctuated up and down with the most recent reading being down to 10.7. I will plan to repeat evaluation  at her next visit.     The patient understands the plans discussed today and is in agreement with them.  She knows to contact our office if she develops concerns  prior to her next appointment.   I provided 45 minutes of face-to-face time during this encounter and > 50% was spent counseling as documented under my assessment and plan.    Adah Perl, PA-C  [facility] - [facility] DEPT [facility] [address] [place] [address] Dept: [phone] Dept Fax: [phone]   Orders Placed This Encounter  Procedures   CT CHEST ABDOMEN PELVIS W CONTRAST    Standing Status:   Future    Standing Expiration Date:   06/20/2024    Scheduling Instructions:     RH    Order Specific Question:   If indicated for the ordered procedure, I authorize the administration of contrast media per Radiology protocol    Answer:   Yes    Order Specific Question:   Does the patient have a contrast media/X-ray dye allergy?    Answer:   No    Order Specific Question:   Is patient pregnant?    Answer:   No    Order Specific Question:   Preferred imaging location?    Answer:   External    Order Specific Question:   If indicated for the ordered procedure, I authorize the administration of oral contrast media per Radiology protocol    Answer:   Yes   Ferritin    Standing Status:   Future    Standing Expiration Date:   06/21/2024   Folate    Standing Status:   Future    Standing Expiration Date:   06/21/2024   Haptoglobin    Standing Status:   Future    Standing Expiration Date:   06/21/2024   Vitamin B12    Standing Status:   Future    Standing Expiration Date:   06/21/2024   Iron and TIBC    Standing Status:   Future    Standing Expiration Date:   06/21/2024   Lactate dehydrogenase    Standing Status:   Future    Standing Expiration Date:   06/21/2024   TSH    Standing Status:   Future    Standing Expiration Date:   06/21/2024      CHIEF COMPLAINT:  CC: Metastatic HER2 receptor positive breast cancer  Current Treatment: Enhertu every 3 weeks/denosumab every 6 weeks  HISTORY OF PRESENT ILLNESS:    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 (HCC)   07/23/2013 Cancer Staging   Staging form: Breast, AJCC 7th Edition - Clinical stage from 07/23/2013: Stage IIB (T2, N1, M0) - Signed by Dellia Beckwith, MD on 07/04/2020 Prognostic indicators: Pos LVI   09/22/2021 - 04/06/2022 Chemotherapy   Patient is on Treatment Plan : BREAST  Trastuzumab + Pertuzumab q21d      09/22/2021 - 09/21/2022 Chemotherapy   Patient is on Treatment Plan : BREAST Trastuzumab + Pertuzumab q21d     10/12/2022 -  Chemotherapy   Patient is on Treatment Plan : BREAST METASTATIC Fam-Trastuzumab Deruxtecan-nxki (Enhertu) (5.4) q21d     Malignant neoplasm metastatic to bone (HCC)  06/28/2021 Initial Diagnosis   Bone metastases (HCC)   09/22/2021 - 04/06/2022 Chemotherapy   Patient  is on Treatment Plan : BREAST  Trastuzumab + Pertuzumab q21d      09/22/2021 - 09/21/2022 Chemotherapy   Patient is on Treatment Plan : BREAST Trastuzumab + Pertuzumab q21d     05/11/2022 Imaging   CT chest, abdomen and pelvis:  IMPRESSION:  1. Widespread bony metastatic disease shows increased sclerosis  since previous imaging. This is likely related to response to  therapy in the interval. There is a single lesion along the LEFT  iliac which shows continued lytic change and warrants attention on  follow-up  2. Interval development of pathologic fractures at in the upper  thoracic spine and at the thoracolumbar junction with associated  kyphosis.  3. No signs of solid organ or nodal metastatic disease.  4. Marked elevation of the LEFT hemidiaphragm as on the prior PET.  5. Aortic atherosclerosis.    10/12/2022 -  Chemotherapy   Patient is on Treatment Plan : BREAST METASTATIC Fam-Trastuzumab Deruxtecan-nxki (Enhertu) (5.4) q21d         INTERVAL HISTORY:  Walesca is here today for  repeat clinical assessment.  She states she continues to do fairly well.  She reports fatigue and shortness of breath with exertion.  She reports mild constipation for which she is a laxative as needed.  She reports occasional nausea and vomiting for which medication is effective.  She denies abdominal pain.  She states her neck and back pain is fairly well-controlled with the current regimen.  She denies fevers or chills. Her appetite is fairly good. Her weight has decreased 4 pounds over last 3 weeks .  She is not extremely active due to pain and fatigue.  REVIEW OF SYSTEMS:  Review of Systems  Constitutional:  Positive for fatigue. Negative for appetite change, chills, fever and unexpected weight change.  HENT:   Negative for lump/mass, mouth sores and sore throat.   Respiratory:  Positive for shortness of breath (With exertion). Negative for cough.   Cardiovascular:  Negative for chest pain and leg swelling.  Gastrointestinal:  Positive for constipation, nausea and vomiting. Negative for abdominal pain and diarrhea.  Endocrine: Negative for hot flashes.  Genitourinary:  Negative for difficulty urinating, dysuria, frequency and hematuria.   Musculoskeletal:  Positive for back pain and neck pain. Negative for arthralgias and myalgias.  Skin:  Negative for rash.  Neurological:  Negative for dizziness and headaches.  Hematological:  Negative for adenopathy. Does not bruise/bleed easily.  Psychiatric/Behavioral:  Negative for depression and sleep disturbance. The patient is not nervous/anxious.      VITALS:  Blood pressure 96/69, pulse 92, temperature 98 F (36.7 C), temperature source Oral, resp. rate 18, height 4\' 10"  (1.473 m), weight 125 lb 11.2 oz (57 kg), last menstrual period 08/18/2013, SpO2 96%.  Wt Readings from Last 3 Encounters:  06/21/23 125 lb 11.2 oz (57 kg)  06/03/23 129 lb 0.6 oz (58.5 kg)  05/31/23 129 lb (58.5 kg)    Body mass index is 26.27 kg/m.  Performance status  (ECOG): 2 - Symptomatic, <50% confined to bed  PHYSICAL EXAM:  Physical Exam Vitals and nursing note reviewed.  Constitutional:      General: She is not in acute distress.    Appearance: She is ill-appearing (Chronically ill-appearing).  HENT:     Head: Normocephalic and atraumatic.     Mouth/Throat:     Mouth: Mucous membranes are moist.     Pharynx: Oropharynx is clear. No oropharyngeal exudate or posterior oropharyngeal erythema.  Eyes:  General: No scleral icterus.    Extraocular Movements: Extraocular movements intact.     Conjunctiva/sclera: Conjunctivae normal.     Pupils: Pupils are equal, round, and reactive to light.  Cardiovascular:     Rate and Rhythm: Normal rate and regular rhythm.     Heart sounds: Normal heart sounds. No murmur heard.    No friction rub. No gallop.  Pulmonary:     Effort: Pulmonary effort is normal.     Breath sounds: Examination of the left-lower field reveals decreased breath sounds. Decreased breath sounds present. No wheezing, rhonchi or rales.  Chest:     Comments: Breast exam is deferred Abdominal:     General: There is no distension.     Palpations: There is no fluid wave, hepatomegaly or splenomegaly.     Tenderness: There is no abdominal tenderness.     Comments: Firmness across lower abdomen with no discrete mass  Musculoskeletal:        General: Normal range of motion.     Cervical back: Normal range of motion and neck supple. No tenderness.     Right lower leg: No edema.     Left lower leg: No edema.  Lymphadenopathy:     Cervical: No cervical adenopathy.     Upper Body:     Right upper body: No supraclavicular or axillary adenopathy.     Left upper body: No supraclavicular or axillary adenopathy.     Lower Body: No right inguinal adenopathy. No left inguinal adenopathy.  Skin:    General: Skin is warm and dry.     Coloration: Skin is not jaundiced.     Findings: No rash.  Neurological:     Mental Status: She is alert and  oriented to person, place, and time.     Cranial Nerves: No cranial nerve deficit.  Psychiatric:        Mood and Affect: Mood normal.        Behavior: Behavior normal.        Thought Content: Thought content normal.     LABS:      Latest Ref Rng & Units 06/21/2023   10:19 AM 05/27/2023   12:00 AM 05/06/2023    2:07 PM  CBC  WBC 4.0 - 10.5 K/uL 5.4  4.4     4.4   Hemoglobin 12.0 - 15.0 g/dL 91.4  78.2     95.6   Hematocrit 36.0 - 46.0 % 33.3  33     36.9   Platelets 150 - 400 K/uL 343  279     312      This result is from an external source.      Latest Ref Rng & Units 06/21/2023   10:19 AM 05/27/2023   12:00 AM 05/06/2023    2:07 PM  CMP  Glucose 70 - 99 mg/dL 93   213   BUN 6 - 20 mg/dL 7  11     15    Creatinine 0.44 - 1.00 mg/dL 0.86  0.5     5.78   Sodium 135 - 145 mmol/L 144  139     137   Potassium 3.5 - 5.1 mmol/L 2.9  3.0     3.5   Chloride 98 - 111 mmol/L 104  104     101   CO2 22 - 32 mmol/L 30  33     29   Calcium 8.9 - 10.3 mg/dL 9.2  46.9     8.7  Total Protein 6.5 - 8.1 g/dL 5.7   6.1   Total Bilirubin <1.2 mg/dL 0.2   0.4   Alkaline Phos 38 - 126 U/L 71  62     63   AST 15 - 41 U/L 21  22     18    ALT 0 - 44 U/L 17  18     19       This result is from an external source.     Lab Results  Component Value Date   CEA1 2.2 07/20/2021   /  CEA  Date Value Ref Range Status  07/20/2021 2.2 0.0 - 4.7 ng/mL Final    Comment:    (NOTE)                             Nonsmokers          <3.9                             Smokers             <5.6 [employer] Electrochemiluminescence Immunoassay (ECLIA) Values obtained with different assay methods or kits cannot be used interchangeably.  Results cannot be interpreted as absolute evidence of the presence or absence of malignant disease. Performed At: [facility] [address] [place], [place] [id number] Jolene Schimke MD WU:[id number]    No results found for: "PSA1" No results found for:  "[id number]" No results found for: "CAN125"  No results found for: "TOTALPROTELP", "ALBUMINELP", "A1GS", "A2GS", "BETS", "BETA2SER", "GAMS", "MSPIKE", "SPEI" Lab Results  Component Value Date   TIBC 256 01/14/2023   TIBC 287 04/24/2022   TIBC 279 10/13/2021   FERRITIN 333 (H) 01/14/2023   FERRITIN 119 04/24/2022   FERRITIN 206 10/13/2021   IRONPCTSAT 16 01/14/2023   IRONPCTSAT 9 (L) 04/24/2022   IRONPCTSAT 13 10/13/2021   No results found for: "LDH"  STUDIES:  ECHOCARDIOGRAM COMPLETE  Result Date: 06/07/2023    ECHOCARDIOGRAM REPORT   Patient Name:   SANTASIA SCHOMBERG [facility] Date of Exam: 06/07/2023 Medical Rec #:  [id number]           Height:       58.0 in Accession #:    [id number]          Weight:       129.0 lb Date of Birth:  1971-02-16          BSA:          1.512 m Patient Age:    51 years            BP:           111/80 mmHg Patient Gender: F                   HR:           101 bpm. Exam Location:  [place] Procedure: 2D Echo, Cardiac Doppler and Color Doppler Indications:    Malignant neoplasm metastatic to bone C79.51.; R01.1 Murmur  History:        Patient has prior history of Echocardiogram examinations, most                 recent 02/12/2023. Signs/Symptoms:Murmur, anemia and Shortness of                 Breath; Risk Factors:Hypertension, Diabetes, Dyslipidemia and  Former Smoker. History of breast cancer s/p chemoradiation X 1                 year, metastatic to bone.  Sonographer:    Vella Kohler BS, RVT, RDCS Referring Phys: [id number] CHRISTINE H MCCARTY  Sonographer Comments: Technically difficult study due to poor echo windows. Heart off axis and high in the chest in kyphotic patient. Global longitudinal strain was attempted. IMPRESSIONS  1. GLS not able to be performed. Left ventricular ejection fraction, by estimation, is 55%%. Left ventricular ejection fraction by 2D MOD biplane is 53.9 %. The left ventricle has normal function. Left ventricular endocardial border not  optimally defined to evaluate regional wall motion. Left ventricular diastolic parameters are consistent with Grade I diastolic dysfunction (impaired relaxation).  2. Right ventricular systolic function is normal. The right ventricular size is normal. There is normal pulmonary artery systolic pressure.  3. The mitral valve is normal in structure. No evidence of mitral valve regurgitation. No evidence of mitral stenosis.  4. The aortic valve is tricuspid. Aortic valve regurgitation is not visualized. No aortic stenosis is present.  5. Aortic DTA is NWV.  6. The inferior vena cava is normal in size with greater than 50% respiratory variability, suggesting right atrial pressure of 3 mmHg. Comparison(s): Previous Echo was TDS. LV EF was 60-65%. FINDINGS  Left Ventricle: GLS not able to be performed. Left ventricular ejection fraction, by estimation, is 55%%. Left ventricular ejection fraction by 2D MOD biplane is 53.9 %. The left ventricle has normal function. Left ventricular endocardial border not optimally defined to evaluate regional wall motion. The left ventricular internal cavity size was normal in size. There is no left ventricular hypertrophy. Left ventricular diastolic parameters are consistent with Grade I diastolic dysfunction (impaired relaxation). Normal left ventricular filling pressure. Right Ventricle: The right ventricular size is normal. No increase in right ventricular wall thickness. Right ventricular systolic function is normal. There is normal pulmonary artery systolic pressure. The tricuspid regurgitant velocity is 2.64 m/s, and  with an assumed right atrial pressure of 3 mmHg, the estimated right ventricular systolic pressure is 30.9 mmHg. Left Atrium: Left atrial size was normal in size. Right Atrium: Right atrial size was normal in size. Pericardium: There is no evidence of pericardial effusion. Mitral Valve: The mitral valve is normal in structure. No evidence of mitral valve regurgitation.  No evidence of mitral valve stenosis. Tricuspid Valve: The tricuspid valve is normal in structure. Tricuspid valve regurgitation is mild . No evidence of tricuspid stenosis. Aortic Valve: The aortic valve is tricuspid. Aortic valve regurgitation is not visualized. No aortic stenosis is present. Pulmonic Valve: The pulmonic valve was normal in structure. Pulmonic valve regurgitation is not visualized. No evidence of pulmonic stenosis. Aorta: The aortic arch was not well visualized, DTA is NWV and the aortic root and ascending aorta are structurally normal, with no evidence of dilitation. Venous: The pulmonary veins were not well visualized. The inferior vena cava is normal in size with greater than 50% respiratory variability, suggesting right atrial pressure of 3 mmHg. IAS/Shunts: No atrial level shunt detected by color flow Doppler.  LEFT VENTRICLE PLAX 2D                        Biplane EF (MOD) LVIDd:         3.20 cm         LV Biplane EF:   Left LVIDs:  2.50 cm                          ventricular LV PW:         0.80 cm                          ejection LV IVS:        0.80 cm                          fraction by LVOT diam:     1.80 cm                          2D MOD LV SV:         43                               biplane is LV SV Index:   28                               53.9 %. LVOT Area:     2.54 cm                                Diastology                                LV e' medial:    10.09 cm/s LV Volumes (MOD)               LV E/e' medial:  7.5 LV vol d, MOD    34.8 ml       LV e' lateral:   9.80 cm/s A2C:                           LV E/e' lateral: 7.7 LV vol d, MOD    48.8 ml A4C: LV vol s, MOD    15.8 ml A2C: LV vol s, MOD    21.5 ml A4C: LV SV MOD A2C:   19.0 ml LV SV MOD A4C:   48.8 ml LV SV MOD BP:    22.5 ml RIGHT VENTRICLE RV S prime:     12.57 cm/s TAPSE (M-mode): 1.5 cm LEFT ATRIUM             Index        RIGHT ATRIUM           Index LA diam:        2.80 cm 1.85 cm/m   RA Area:     11.40  cm LA Vol (A2C):   30.2 ml 19.98 ml/m  RA Volume:   27.60 ml  18.26 ml/m LA Vol (A4C):   17.1 ml 11.31 ml/m LA Biplane Vol: 23.4 ml 15.48 ml/m  AORTIC VALVE LVOT Vmax:   99.80 cm/s LVOT Vmean:  64.200 cm/s LVOT VTI:    0.169 m  AORTA Ao Root diam: 2.80 cm MV E velocity: 75.40 cm/s  TRICUSPID VALVE MV A velocity: 83.40 cm/s  TR Peak grad:   27.9 mmHg MV E/A ratio:  0.90        TR  Vmax:        264.00 cm/s                             SHUNTS                            Systemic VTI:  0.17 m                            Systemic Diam: 1.80 cm Norman Herrlich MD Electronically signed by Norman Herrlich MD Signature Date/Time: 06/07/2023/1:41:13 PM    Final       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   Bipolar disorder (HCC) 11/29/2020   Breast cancer (HCC)    Cancer (HCC)    Cardiac murmur    Chest pain    Chronic fatigue syndrome 11/29/2020   Depression    Essential hypertension 11/29/2020   Hepatic steatosis determined by biopsy of liver 07/04/2020   HLD (hyperlipidemia)    Hypertension    Hypomagnesemia 03/26/2023   Hypothyroidism 11/29/2020   Insomnia 11/29/2020   Iron deficiency anemia 06/05/2022   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   Migraine 11/29/2020   Mild intermittent asthma 11/29/2020   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   Mixed hyperlipidemia 11/29/2020   Obesity due to excess calories    Osteopenia after menopause 07/04/2020   Other long term (current) drug therapy 11/29/2020   Other vitamin B12 deficiency anemias 11/29/2020   Personal history of malignant neoplasm of breast 11/29/2020   Pre-diabetes    Renal cyst, acquired, right 07/04/2020   14.5 cm in October 2021   Retrocalcaneal bursitis (back of  heel), left 04/22/2018   Seasonal allergic rhinitis 11/29/2020   Tightness of heel cord, left 04/22/2018   Type 2 diabetes mellitus without complications (HCC) 11/29/2020   Vitamin D deficiency 11/29/2020    Past Surgical History:  Procedure Laterality Date   CESAREAN SECTION     IR BONE TUMOR(S)RF ABLATION  05/29/2022   IR BONE TUMOR(S)RF ABLATION  05/29/2022   IR KYPHO EA ADDL LEVEL THORACIC OR LUMBAR  05/29/2022   IR KYPHO THORACIC WITH BONE BIOPSY  05/29/2022   IR RADIOLOGIST EVAL & MGMT  05/22/2022   IR RADIOLOGIST EVAL & MGMT  06/12/2022   LIVER BIOPSY  01/2019   lumpectomy  2014   Left Breast   PORT-A-CATH REMOVAL     PORTACATH PLACEMENT  08/28/2013   STOMACH SURGERY     Tummy Tuck   TOTAL HIP ARTHROPLASTY Right 07/04/2021   Procedure: TOTAL HIP ARTHROPLASTY;  Surgeon: Durene Romans, MD;  Location: [facility];  Service: Orthopedics;  Laterality: Right;  90   WISDOM TOOTH EXTRACTION      Family History  Problem Relation Age of Onset   Hyperlipidemia Mother    Diabetes Father    Stroke Father    Colon cancer Neg Hx    Colon polyps Neg Hx    Esophageal cancer Neg Hx    Rectal cancer Neg Hx    Stomach cancer Neg Hx     Social History:  reports that she quit smoking about 20  years ago. Her smoking use included cigarettes. She has never used smokeless tobacco. She reports current alcohol use. She reports that she does not use drugs.The patient is accompanied by her husband today.  Allergies:  Allergies  Allergen Reactions   Reglan [Metoclopramide]     Made patient feel really funny when taking this medication     Current Medications: Current Outpatient Medications  Medication Sig Dispense Refill   albuterol (VENTOLIN HFA) 108 (90 Base) MCG/ACT inhaler Inhale 2 puffs into the lungs every 6 (six) hours as needed for shortness of breath.     ARIPiprazole (ABILIFY) 5 MG tablet Take 2.5 mg by mouth daily.     aspirin 81 MG EC tablet Take 81 mg by mouth daily. Swallow  whole.     atorvastatin (LIPITOR) 20 MG tablet TAKE ONE TABLET BY MOUTH EVERY DAY 90 tablet 0   augmented betamethasone dipropionate (DIPROLENE-AF) 0.05 % cream      dapagliflozin propanediol (FARXIGA) 10 MG TABS tablet TAKE ONE TABLET BY MOUTH EVERY DAY 30 tablet 10   desvenlafaxine (PRISTIQ) 50 MG 24 hr tablet Take 50 mg by mouth daily.     dexamethasone (DECADRON) 4 MG tablet Take 1 tablet (4 mg total) by mouth 2 (two) times daily. For 5 days after chemotherapy 20 tablet 2   diphenoxylate-atropine (LOMOTIL) 2.5-0.025 MG tablet TAKE TWO TABLETS BY MOUTH FOUR TIMES A DAY AS NEEDED FOR DIARRHEA OR LOOSE STOOLS 100 tablet 1   labetalol (NORMODYNE) 100 MG tablet Take 1 tablet (100 mg total) by mouth 2 (two) times daily. 180 tablet 3   loratadine (CLARITIN) 10 MG tablet TAKE ONE TABLET BY MOUTH DAILY 30 tablet 2   LORazepam (ATIVAN) 1 MG tablet TAKE ONE TABLET BY MOUTH EVERY 8 HOURS 30 tablet 1   magic mouthwash (lidocaine, diphenhydrAMINE, alum & mag hydroxide) suspension Take 5 mLs by mouth every 3 (three) hours as needed. Swish and spit; for throat/mouth pain     naloxone (NARCAN) nasal spray 4 mg/0.1 mL One spray in nostril as needed, may repeat every 2 to 3 minutes until medical assistance becomes available 4 each 1   nitroGLYCERIN (NITROSTAT) 0.4 MG SL tablet Place 0.4 mg under the tongue every 5 (five) minutes as needed for chest pain.     nystatin (MYCOSTATIN/NYSTOP) powder Apply 1 Application topically 2 (two) times daily.     omeprazole (PRILOSEC) 40 MG capsule Take 1 capsule (40 mg total) by mouth at bedtime. 30 capsule 5   ondansetron (ZOFRAN) 8 MG tablet Take 1 tablet (8 mg total) by mouth every 8 (eight) hours as needed for nausea or vomiting. 60 tablet 5   Oxycodone HCl 10 MG TABS TAKE ONE TABLET BY MOUTH EVERY 4 HOURS AS NEEDED FOR PAIN 180 tablet 0   Potassium Chloride 10 % SOLN Take 20 mEq by mouth 2 (two) times daily. (Patient not taking: Reported on 06/21/2023) 900 mL 2    prochlorperazine (COMPAZINE) 10 MG tablet TAKE ONE TABLET BY MOUTH EVERY 6 HOURS AS NEEDED FOR NAUSEA/VOMITING 90 tablet 3   Current Facility-Administered Medications  Medication Dose Route Frequency Provider Last Rate Last Admin   technetium sestamibi generic (CARDIOLITE) injection 10.5 millicurie  10.5 millicurie Intravenous Once PRN Revankar, Aundra Dubin, MD

## 2023-06-20 ENCOUNTER — Encounter: Payer: Self-pay | Admitting: Oncology

## 2023-06-20 ENCOUNTER — Encounter: Payer: Self-pay | Admitting: Hematology and Oncology

## 2023-06-21 ENCOUNTER — Inpatient Hospital Stay: Payer: BC Managed Care – PPO

## 2023-06-21 ENCOUNTER — Ambulatory Visit: Payer: BC Managed Care – PPO

## 2023-06-21 ENCOUNTER — Encounter: Payer: Self-pay | Admitting: Oncology

## 2023-06-21 ENCOUNTER — Inpatient Hospital Stay: Payer: BC Managed Care – PPO | Attending: Hematology and Oncology | Admitting: Hematology and Oncology

## 2023-06-21 ENCOUNTER — Encounter: Payer: Self-pay | Admitting: Hematology and Oncology

## 2023-06-21 ENCOUNTER — Other Ambulatory Visit: Payer: Self-pay | Admitting: Pharmacist

## 2023-06-21 VITALS — BP 99/71 | HR 97

## 2023-06-21 VITALS — BP 96/69 | HR 92 | Temp 98.0°F | Resp 18 | Ht <= 58 in | Wt 125.7 lb

## 2023-06-21 DIAGNOSIS — C50412 Malignant neoplasm of upper-outer quadrant of left female breast: Secondary | ICD-10-CM | POA: Insufficient documentation

## 2023-06-21 DIAGNOSIS — Z5112 Encounter for antineoplastic immunotherapy: Secondary | ICD-10-CM | POA: Diagnosis not present

## 2023-06-21 DIAGNOSIS — Z170 Estrogen receptor positive status [ER+]: Secondary | ICD-10-CM

## 2023-06-21 DIAGNOSIS — Z79899 Other long term (current) drug therapy: Secondary | ICD-10-CM | POA: Insufficient documentation

## 2023-06-21 DIAGNOSIS — C7951 Secondary malignant neoplasm of bone: Secondary | ICD-10-CM

## 2023-06-21 DIAGNOSIS — D630 Anemia in neoplastic disease: Secondary | ICD-10-CM | POA: Diagnosis not present

## 2023-06-21 DIAGNOSIS — Z5111 Encounter for antineoplastic chemotherapy: Secondary | ICD-10-CM | POA: Diagnosis present

## 2023-06-21 DIAGNOSIS — E876 Hypokalemia: Secondary | ICD-10-CM

## 2023-06-21 DIAGNOSIS — Z87891 Personal history of nicotine dependence: Secondary | ICD-10-CM | POA: Insufficient documentation

## 2023-06-21 DIAGNOSIS — Z1731 Human epidermal growth factor receptor 2 positive status: Secondary | ICD-10-CM | POA: Diagnosis not present

## 2023-06-21 DIAGNOSIS — K660 Peritoneal adhesions (postprocedural) (postinfection): Secondary | ICD-10-CM

## 2023-06-21 LAB — CMP (CANCER CENTER ONLY)
ALT: 17 U/L (ref 0–44)
AST: 21 U/L (ref 15–41)
Albumin: 3.6 g/dL (ref 3.5–5.0)
Alkaline Phosphatase: 71 U/L (ref 38–126)
Anion gap: 10 (ref 5–15)
BUN: 7 mg/dL (ref 6–20)
CO2: 30 mmol/L (ref 22–32)
Calcium: 9.2 mg/dL (ref 8.9–10.3)
Chloride: 104 mmol/L (ref 98–111)
Creatinine: 0.56 mg/dL (ref 0.44–1.00)
GFR, Estimated: >60 mL/min (ref >60)
Glucose, Bld: 93 mg/dL (ref 70–99)
Potassium: 2.9 mmol/L — ABNORMAL LOW (ref 3.5–5.1)
Sodium: 144 mmol/L (ref 135–145)
Total Bilirubin: 0.2 mg/dL (ref <1.2)
Total Protein: 5.7 g/dL — ABNORMAL LOW (ref 6.5–8.1)

## 2023-06-21 LAB — CBC WITH DIFFERENTIAL (CANCER CENTER ONLY)
Abs Immature Granulocytes: 0.02 10*3/uL (ref 0.00–0.07)
Basophils Absolute: 0 10*3/uL (ref 0.0–0.1)
Basophils Relative: 1 %
Eosinophils Absolute: 0.2 10*3/uL (ref 0.0–0.5)
Eosinophils Relative: 4 %
HCT: 33.3 % — ABNORMAL LOW (ref 36.0–46.0)
Hemoglobin: 10.7 g/dL — ABNORMAL LOW (ref 12.0–15.0)
Immature Granulocytes: 0 %
Lymphocytes Relative: 9 %
Lymphs Abs: 0.5 10*3/uL — ABNORMAL LOW (ref 0.7–4.0)
MCH: 28.7 pg (ref 26.0–34.0)
MCHC: 32.1 g/dL (ref 30.0–36.0)
MCV: 89.3 fL (ref 80.0–100.0)
Monocytes Absolute: 0.7 10*3/uL (ref 0.1–1.0)
Monocytes Relative: 13 %
Neutro Abs: 4 10*3/uL (ref 1.7–7.7)
Neutrophils Relative %: 73 %
Platelet Count: 343 10*3/uL (ref 150–400)
RBC: 3.73 MIL/uL — ABNORMAL LOW (ref 3.87–5.11)
RDW: 16.6 % — ABNORMAL HIGH (ref 11.5–15.5)
WBC Count: 5.4 10*3/uL (ref 4.0–10.5)
nRBC: 0 % (ref 0.0–0.2)
nRBC: 0 /100{WBCs}

## 2023-06-21 LAB — MAGNESIUM: Magnesium: 1.8 mg/dL (ref 1.7–2.4)

## 2023-06-21 MED ORDER — SODIUM CHLORIDE 0.9% FLUSH
10.0000 mL | INTRAVENOUS | Status: DC | PRN
  Administered 2023-06-21: 10 mL

## 2023-06-21 MED ORDER — HEPARIN SOD (PORK) LOCK FLUSH 100 UNIT/ML IV SOLN
500.0000 [IU] | Freq: Once | INTRAVENOUS | Status: AC | PRN
  Administered 2023-06-21: 500 [IU]

## 2023-06-21 MED ORDER — ACETAMINOPHEN 325 MG PO TABS: 650.0000 mg | ORAL_TABLET | Freq: Once | ORAL | Status: DC

## 2023-06-21 MED ORDER — DIPHENHYDRAMINE HCL 25 MG PO CAPS: 50.0000 mg | ORAL_CAPSULE | Freq: Once | ORAL | Status: DC

## 2023-06-21 MED ORDER — SODIUM CHLORIDE 0.9 % IV SOLN
150.0000 mg | Freq: Once | INTRAVENOUS | Status: AC
  Administered 2023-06-21: 150 mg via INTRAVENOUS
  Filled 2023-06-21: qty 150

## 2023-06-21 MED ORDER — DEXTROSE 5 % IV SOLN: Freq: Once | INTRAVENOUS | Status: AC

## 2023-06-21 MED ORDER — FAM-TRASTUZUMAB DERUXTECAN-NXKI CHEMO 100 MG IV SOLR
5.3000 mg/kg | Freq: Once | INTRAVENOUS | Status: AC
  Administered 2023-06-21: 300 mg via INTRAVENOUS
  Filled 2023-06-21: qty 15

## 2023-06-21 MED ORDER — DEXAMETHASONE SODIUM PHOSPHATE 10 MG/ML IJ SOLN
10.0000 mg | Freq: Once | INTRAMUSCULAR | Status: AC
Start: 2023-06-21 — End: 2023-06-21
  Administered 2023-06-21: 10 mg via INTRAVENOUS
  Filled 2023-06-21: qty 1

## 2023-06-21 MED ORDER — POTASSIUM CHLORIDE 10 MEQ/100ML IV SOLN
10.0000 meq | INTRAVENOUS | Status: AC
  Administered 2023-06-21 (×2): 10 meq via INTRAVENOUS
  Filled 2023-06-21 (×2): qty 100

## 2023-06-21 MED ORDER — PALONOSETRON HCL INJECTION 0.25 MG/5ML
0.2500 mg | Freq: Once | INTRAVENOUS | Status: AC
Start: 2023-06-21 — End: 2023-06-21
  Administered 2023-06-21: 0.25 mg via INTRAVENOUS
  Filled 2023-06-21: qty 5

## 2023-06-21 NOTE — Assessment & Plan Note (Addendum)
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 Ki67 was 60%. PET imaging in December revealed dominant finding of intensely hypermetabolic aggressive skeletal metastasis involving the calvarium, sternum, thoracic and lumbar spine, and pelvis, with potential pathologic fracture of the right femur with internal fixation. There was soft tissue extension into the anterior mediastinum associated with the manubrial metastasis. Multiple spinal vertebral body lesions had cortical destruction along the posterior margin. She started monthly Xgeva in December, and completed radiation therapy. She was receiving THP, but cycle 4 was delayed and dose reduction made by 20% due to left leg skin infection.   She continued with Herceptin/Perjeta until she had progression of disease. In January 2024, she developed increased pain of the left hip. She was found to have innumerable metastatic lesions throughout the pelvis and proximal femur. There is a non displaced pathologic fracture through a large lesion in the superior left acetabulum. She was referred to an orthopedic oncologist at [facility] and they recommended radiation and no surgery. She received her palliative radiation. She followed-up with [facility] in April and they do not recommend surgery, but will continue surveillance.   We then switched her to [facility] chemotherapy, but she has had significant toxicities of nausea, vomiting, diarrhea, and hypokalemia.  She continues monthly Xgeva.  She was also placed on hormonal therapy with Faslodex injections.  Repeat CT imaging in July revealed decreased soft tissue thickening around the left subclavian structures and thoracic inlet.  Left lateral breast mass was stable.  Widespread  mixed lytic and sclerotic osseous metastatic lesions were stable.  There was unchanged severe elevation of the left hemidiaphragm.   CT neck in October to evaluate for possible soft tissue mass and painful swallowing did not reveal any soft tissue masses.  The extensive mixed lytic and sclerotic skeletal metastases were much more pronounced in the visible upper thorax than in the cervical spine and were felt to be grossly stable from July 2024.   She continues Enhertu every 3 weeks.  Her pain remains fairly well-controlled on the current regimen.  She will proceed with 13th cycle today.  She will continue denosumab every 6 weeks, so will be due for that in 3 weeks. We will plan to see her back in 3 weeks, comprehensive metabolic panel and CT chest, abdomen and pelvis to reassess her disease baseline prior to Enhertu and denosumab.

## 2023-06-21 NOTE — Patient Instructions (Signed)
Fam-Trastuzumab Deruxtecan Injection What is this medication? FAM-TRASTUZUMAB DERUXTECAN (fam-tras TOOZ eu mab DER ux TEE kan) treats some types of cancer. It works by blocking a protein that causes cancer cells to grow and multiply. This helps to slow or stop the spread of cancer cells. This medicine may be used for other purposes; ask your health care provider or pharmacist if you have questions. COMMON BRAND NAME(S): ENHERTU What should I tell my care team before I take this medication? They need to know if you have any of these conditions: Heart disease Heart failure Infection, especially a viral infection, such as chickenpox, cold sores, or herpes Liver disease Lung or breathing disease, such as asthma or COPD An unusual or allergic reaction to fam-trastuzumab deruxtecan, other medications, foods, dyes, or preservatives Pregnant or trying to get pregnant Breast-feeding How should I use this medication? This medication is injected into a vein. It is given by your care team in a hospital or clinic setting. A special MedGuide will be given to you before each treatment. Be sure to read this information carefully each time. Talk to your care team about the use of this medication in children. Special care may be needed. Overdosage: If you think you have taken too much of this medicine contact a poison control center or emergency room at once. NOTE: This medicine is only for you. Do not share this medicine with others. What if I miss a dose? It is important not to miss your dose. Call your care team if you are unable to keep an appointment. What may interact with this medication? Interactions are not expected. This list may not describe all possible interactions. Give your health care provider a list of all the medicines, herbs, non-prescription drugs, or dietary supplements you use. Also tell them if you smoke, drink alcohol, or use illegal drugs. Some items may interact with your  medicine. What should I watch for while using this medication? Visit your care team for regular checks on your progress. Tell your care team if your symptoms do not start to get better or if they get worse. Your condition will be monitored carefully while you are receiving this medication. Do not become pregnant while taking this medication or for 7 months after stopping it. Women should inform their care team if they wish to become pregnant or think they might be pregnant. Men should not father a child while taking this medication and for 4 months after stopping it. There is potential for serious side effects to an unborn child. Talk to your care team for more information. Do not breast-feed an infant while taking this medication or for 7 months after the last dose. This medication has caused decreased sperm counts in some men. This may make it more difficult to father a child. Talk to your care team if you are concerned about your fertility. This medication may increase your risk to bruise or bleed. Call your care team if you notice any unusual bleeding. Be careful brushing or flossing your teeth or using a toothpick because you may get an infection or bleed more easily. If you have any dental work done, tell your dentist you are receiving this medication. This medication may cause dry eyes and blurred vision. If you wear contact lenses, you may feel some discomfort. Lubricating eye drops may help. See your care team if the problem does not go away or is severe. This medication may increase your risk of getting an infection. Call your care team for   advice if you get a fever, chills, sore throat, or other symptoms of a cold or flu. Do not treat yourself. Try to avoid being around people who are sick. Avoid taking medications that contain aspirin, acetaminophen, ibuprofen, naproxen, or ketoprofen unless instructed by your care team. These medications may hide a fever. What side effects may I notice from  receiving this medication? Side effects that you should report to your care team as soon as possible: Allergic reactions--skin rash, itching, hives, swelling of the face, lips, tongue, or throat Dry cough, shortness of breath or trouble breathing Infection--fever, chills, cough, sore throat, wounds that don't heal, pain or trouble when passing urine, general feeling of discomfort or being unwell Heart failure--shortness of breath, swelling of the ankles, feet, or hands, sudden weight gain, unusual weakness or fatigue Unusual bruising or bleeding Side effects that usually do not require medical attention (report these to your care team if they continue or are bothersome): Constipation Diarrhea Hair loss Muscle pain Nausea Vomiting This list may not describe all possible side effects. Call your doctor for medical advice about side effects. You may report side effects to FDA at 1-800-FDA-1088. Where should I keep my medication? This medication is given in a hospital or clinic. It will not be stored at home. NOTE: This sheet is a summary. It may not cover all possible information. If you have questions about this medicine, talk to your doctor, pharmacist, or health care provider.  2024 [employer]/Gold Standard (2021-05-16 00:00:00)  

## 2023-06-21 NOTE — Addendum Note (Signed)
Addended by: Domenic Schwab on: 06/21/2023 04:51 PM   Modules accepted: Orders

## 2023-06-21 NOTE — Progress Notes (Signed)
Reduce dose of fam-trastuzumab to 300 mg today due to weight loss per Harvin Hazel.

## 2023-06-21 NOTE — Progress Notes (Signed)
Face to face contact with pt and her husband in clinic today. Pt is pleasant and smiling today. She reports that she is feeling okay. She reports that she sometimes the treatments make her feel bad for a while but not always. Encouraged pt to call with questions or concerns.

## 2023-06-22 ENCOUNTER — Encounter: Payer: Self-pay | Admitting: Oncology

## 2023-06-22 ENCOUNTER — Encounter: Payer: Self-pay | Admitting: Hematology and Oncology

## 2023-06-22 NOTE — Assessment & Plan Note (Addendum)
Persistent hypokalemia.  She states she is unable to tolerate liquid potassium due to the taste.  I recommended that she put it in some juice and hopefully this will be better tolerated.  She will receive IV potassium today.

## 2023-06-22 NOTE — Assessment & Plan Note (Signed)
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 She was found to be iron deficient in September 2023 and was given IV iron supplement. The hemoglobin has since fluctuated up and down with the most recent reading being down to 10.7. I will plan to repeat evaluation at her next visit.

## 2023-06-22 NOTE — Assessment & Plan Note (Signed)
Hypocalcemia resolved with oral calcium replacement.  She continues taking calcium twice daily.

## 2023-06-24 ENCOUNTER — Ambulatory Visit: Payer: BC Managed Care – PPO

## 2023-06-24 ENCOUNTER — Inpatient Hospital Stay: Payer: BC Managed Care – PPO

## 2023-06-24 ENCOUNTER — Telehealth: Payer: Self-pay

## 2023-06-24 VITALS — BP 123/69 | HR 88 | Temp 98.0°F | Resp 18 | Wt 126.0 lb

## 2023-06-24 DIAGNOSIS — C7951 Secondary malignant neoplasm of bone: Secondary | ICD-10-CM

## 2023-06-24 DIAGNOSIS — Z5111 Encounter for antineoplastic chemotherapy: Secondary | ICD-10-CM | POA: Diagnosis not present

## 2023-06-24 MED ORDER — FULVESTRANT 250 MG/5ML IM SOSY
500.0000 mg | PREFILLED_SYRINGE | Freq: Once | INTRAMUSCULAR | Status: AC
  Administered 2023-06-24: 500 mg via INTRAMUSCULAR
  Filled 2023-06-24: qty 10

## 2023-06-24 MED ORDER — SODIUM CHLORIDE 0.9% FLUSH
10.0000 mL | Freq: Two times a day (BID) | INTRAVENOUS | Status: DC
  Administered 2023-06-24: 10 mL via INTRAVENOUS

## 2023-06-24 MED ORDER — POTASSIUM CHLORIDE 10 MEQ/100ML IV SOLN
10.0000 meq | INTRAVENOUS | Status: AC
  Administered 2023-06-24 (×2): 10 meq via INTRAVENOUS
  Filled 2023-06-24 (×2): qty 100

## 2023-06-24 MED ORDER — SODIUM CHLORIDE 0.9% FLUSH: 10.0000 mL | Freq: Once | INTRAVENOUS | Status: DC | PRN

## 2023-06-24 MED ORDER — HEPARIN SOD (PORK) LOCK FLUSH 100 UNIT/ML IV SOLN
500.0000 [IU] | Freq: Once | INTRAVENOUS | Status: AC | PRN
  Administered 2023-06-24: 500 [IU]

## 2023-06-24 NOTE — Telephone Encounter (Signed)
Spoke with Kriste Basque she voiced Dr Gilman Buttner had stopped that because her calcium was to high , she no longer takes it.

## 2023-06-24 NOTE — Telephone Encounter (Signed)
-----   Message from Adah Perl sent at 06/22/2023  1:10 PM EST ----- It is my understanding that she is taking calcium/vit D twice daily. Please confirm and add to med list. Thanks

## 2023-06-26 ENCOUNTER — Other Ambulatory Visit: Payer: Self-pay

## 2023-07-10 ENCOUNTER — Inpatient Hospital Stay (HOSPITAL_BASED_OUTPATIENT_CLINIC_OR_DEPARTMENT_OTHER): Payer: BC Managed Care – PPO | Admitting: Oncology

## 2023-07-10 ENCOUNTER — Encounter: Payer: Self-pay | Admitting: Oncology

## 2023-07-10 ENCOUNTER — Inpatient Hospital Stay: Payer: BC Managed Care – PPO

## 2023-07-10 ENCOUNTER — Other Ambulatory Visit: Payer: Self-pay

## 2023-07-10 VITALS — BP 107/68 | HR 97 | Temp 98.9°F | Resp 12 | Ht <= 58 in | Wt 125.3 lb

## 2023-07-10 VITALS — Resp 18

## 2023-07-10 DIAGNOSIS — Z170 Estrogen receptor positive status [ER+]: Secondary | ICD-10-CM

## 2023-07-10 DIAGNOSIS — C7951 Secondary malignant neoplasm of bone: Secondary | ICD-10-CM

## 2023-07-10 DIAGNOSIS — C50412 Malignant neoplasm of upper-outer quadrant of left female breast: Secondary | ICD-10-CM

## 2023-07-10 DIAGNOSIS — L98491 Non-pressure chronic ulcer of skin of other sites limited to breakdown of skin: Secondary | ICD-10-CM

## 2023-07-10 DIAGNOSIS — D630 Anemia in neoplastic disease: Secondary | ICD-10-CM

## 2023-07-10 DIAGNOSIS — Z09 Encounter for follow-up examination after completed treatment for conditions other than malignant neoplasm: Secondary | ICD-10-CM | POA: Diagnosis not present

## 2023-07-10 DIAGNOSIS — Z5111 Encounter for antineoplastic chemotherapy: Secondary | ICD-10-CM | POA: Diagnosis not present

## 2023-07-10 DIAGNOSIS — L98499 Non-pressure chronic ulcer of skin of other sites with unspecified severity: Secondary | ICD-10-CM | POA: Insufficient documentation

## 2023-07-10 LAB — CBC WITH DIFFERENTIAL (CANCER CENTER ONLY)
Abs Immature Granulocytes: 0.02 10*3/uL (ref 0.00–0.07)
Basophils Absolute: 0 10*3/uL (ref 0.0–0.1)
Basophils Relative: 1 %
Eosinophils Absolute: 0.3 10*3/uL (ref 0.0–0.5)
Eosinophils Relative: 5 %
HCT: 30.9 % — ABNORMAL LOW (ref 36.0–46.0)
Hemoglobin: 9.7 g/dL — ABNORMAL LOW (ref 12.0–15.0)
Immature Granulocytes: 0 %
Lymphocytes Relative: 10 %
Lymphs Abs: 0.5 10*3/uL — ABNORMAL LOW (ref 0.7–4.0)
MCH: 28.4 pg (ref 26.0–34.0)
MCHC: 31.4 g/dL (ref 30.0–36.0)
MCV: 90.4 fL (ref 80.0–100.0)
Monocytes Absolute: 0.7 10*3/uL (ref 0.1–1.0)
Monocytes Relative: 15 %
Neutro Abs: 3.2 10*3/uL (ref 1.7–7.7)
Neutrophils Relative %: 69 %
Platelet Count: 360 10*3/uL (ref 150–400)
RBC: 3.42 MIL/uL — ABNORMAL LOW (ref 3.87–5.11)
RDW: 17.2 % — ABNORMAL HIGH (ref 11.5–15.5)
WBC Count: 4.6 10*3/uL (ref 4.0–10.5)
nRBC: 0 % (ref 0.0–0.2)
nRBC: 0 /100{WBCs}

## 2023-07-10 LAB — CMP (CANCER CENTER ONLY)
ALT: 13 U/L (ref 0–44)
AST: 15 U/L (ref 15–41)
Albumin: 3.5 g/dL (ref 3.5–5.0)
Alkaline Phosphatase: 77 U/L (ref 38–126)
Anion gap: 10 (ref 5–15)
BUN: 9 mg/dL (ref 6–20)
CO2: 29 mmol/L (ref 22–32)
Calcium: 9.2 mg/dL (ref 8.9–10.3)
Chloride: 104 mmol/L (ref 98–111)
Creatinine: 0.7 mg/dL (ref 0.44–1.00)
GFR, Estimated: >60 mL/min (ref >60)
Glucose, Bld: 127 mg/dL — ABNORMAL HIGH (ref 70–99)
Potassium: 3.3 mmol/L — ABNORMAL LOW (ref 3.5–5.1)
Sodium: 143 mmol/L (ref 135–145)
Total Bilirubin: 0.3 mg/dL (ref <1.2)
Total Protein: 5.5 g/dL — ABNORMAL LOW (ref 6.5–8.1)

## 2023-07-10 LAB — IRON AND TIBC
Iron: 24 ug/dL — ABNORMAL LOW (ref 28–170)
Saturation Ratios: 11 % (ref 10.4–31.8)
TIBC: 225 ug/dL — ABNORMAL LOW (ref 250–450)
UIBC: 201 ug/dL

## 2023-07-10 LAB — FOLATE: Folate: 9.2 ng/mL (ref >5.9)

## 2023-07-10 LAB — TSH: TSH: 3.062 u[IU]/mL (ref 0.350–4.500)

## 2023-07-10 LAB — FERRITIN: Ferritin: 305 ng/mL (ref 11–307)

## 2023-07-10 LAB — LACTATE DEHYDROGENASE: LDH: 171 U/L (ref 98–192)

## 2023-07-10 LAB — VITAMIN B12: Vitamin B-12: 224 pg/mL (ref 180–914)

## 2023-07-10 MED ORDER — DENOSUMAB 120 MG/1.7ML ~~LOC~~ SOLN
120.0000 mg | Freq: Once | SUBCUTANEOUS | Status: AC
Start: 2023-07-10 — End: 2023-07-10
  Administered 2023-07-10: 120 mg via SUBCUTANEOUS
  Filled 2023-07-10: qty 1.7

## 2023-07-10 MED ORDER — PALONOSETRON HCL INJECTION 0.25 MG/5ML
0.2500 mg | Freq: Once | INTRAVENOUS | Status: AC
  Administered 2023-07-10: 0.25 mg via INTRAVENOUS
  Filled 2023-07-10: qty 5

## 2023-07-10 MED ORDER — DEXAMETHASONE SODIUM PHOSPHATE 10 MG/ML IJ SOLN
10.0000 mg | Freq: Once | INTRAMUSCULAR | Status: AC
  Administered 2023-07-10: 10 mg via INTRAVENOUS
  Filled 2023-07-10: qty 1

## 2023-07-10 MED ORDER — DIPHENHYDRAMINE HCL 25 MG PO CAPS: 50.0000 mg | ORAL_CAPSULE | Freq: Once | ORAL | Status: DC

## 2023-07-10 MED ORDER — POTASSIUM CHLORIDE CRYS ER 20 MEQ PO TBCR: 20.0000 meq | EXTENDED_RELEASE_TABLET | Freq: Two times a day (BID) | ORAL | 1 refills | Status: DC

## 2023-07-10 MED ORDER — FAM-TRASTUZUMAB DERUXTECAN-NXKI CHEMO 100 MG IV SOLR
5.3000 mg/kg | Freq: Once | INTRAVENOUS | Status: AC
  Administered 2023-07-10: 300 mg via INTRAVENOUS
  Filled 2023-07-10: qty 15

## 2023-07-10 MED ORDER — ACETAMINOPHEN 325 MG PO TABS: 650.0000 mg | ORAL_TABLET | Freq: Once | ORAL | Status: DC

## 2023-07-10 MED ORDER — DEXTROSE 5 % IV SOLN: Freq: Once | INTRAVENOUS | Status: AC

## 2023-07-10 MED ORDER — SODIUM CHLORIDE 0.9 % IV SOLN
150.0000 mg | Freq: Once | INTRAVENOUS | Status: AC
  Administered 2023-07-10: 150 mg via INTRAVENOUS
  Filled 2023-07-10: qty 150

## 2023-07-10 NOTE — Progress Notes (Signed)
Spoke with Dr Angelene Giovanni regarding pt's recent echo on 06/07/23 w/an ef of 55% compared to 02/12/23 w/an ef of 60-65%. Ok to treat per Dr Angelene Giovanni.    Spoke with dr Angelene Giovanni about pt's k+ of 3.3 today.  Pt taking gummie potassium at home which he changed to po bid.  Pt notified. Marchelle Folks hunt, cma to call in prescription to [facility].

## 2023-07-10 NOTE — Progress Notes (Signed)
Anna [name]:  Patient Care Team: Hague, Myrene Galas, MD as PCP - General (Internal Medicine) Thomasene Ripple, DO as PCP - Cardiology (Cardiology) Dellia Beckwith, MD as Consulting Physician (Oncology) Lance Bosch, MD as Consulting Physician (Radiation Oncology) Charlyne Petrin, RN as Registered Nurse  CHIEF COMPLAINTS/PURPOSE OF CONSULTATION:  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 (HCC)   07/23/2013 Cancer Staging   Staging form: Breast, AJCC 7th Edition - Clinical stage from 07/23/2013: Stage IIB (T2, N1, M0) - Signed by Dellia Beckwith, MD on 07/04/2020 Prognostic indicators: Pos LVI   09/22/2021 - 04/06/2022 Chemotherapy   Patient is on Treatment Plan : BREAST  Trastuzumab + Pertuzumab q21d      09/22/2021 - 09/21/2022 Chemotherapy   Patient is on Treatment Plan : BREAST Trastuzumab + Pertuzumab q21d     10/12/2022 -  Chemotherapy   Patient is on Treatment Plan : BREAST METASTATIC Fam-Trastuzumab Deruxtecan-nxki (Enhertu) (5.4) q21d     Malignant neoplasm metastatic to bone (HCC)  06/28/2021 Initial Diagnosis   Bone metastases (HCC)   09/22/2021 - 04/06/2022 Chemotherapy   Patient is on Treatment Plan : BREAST  Trastuzumab + Pertuzumab q21d      09/22/2021 - 09/21/2022 Chemotherapy   Patient is on Treatment Plan : BREAST Trastuzumab + Pertuzumab q21d     05/11/2022 Imaging   CT chest, abdomen and pelvis:  IMPRESSION:  1. Widespread bony metastatic disease shows increased sclerosis  since previous imaging. This is likely related to response to  therapy in the interval. There is a single lesion along the LEFT  iliac which shows continued lytic change and warrants attention on  follow-up  2. Interval development of pathologic fractures at in the upper  thoracic spine and at the thoracolumbar  junction with associated  kyphosis.  3. No signs of solid organ or nodal metastatic disease.  4. Marked elevation of the LEFT hemidiaphragm as on the prior PET.  5. Aortic atherosclerosis.    10/12/2022 -  Chemotherapy   Patient is on Treatment Plan : BREAST METASTATIC Fam-Trastuzumab Deruxtecan-nxki (Enhertu) (5.4) q21d       HISTORY OF PRESENTING ILLNESS: Anna [name] 52 y.o. female is here because of  metastatic breast cancer  December 2014:  Diagnosed with stage IIB hormone receptor positive breast cancer.  Treated with surgery, chemotherapy and radiation therapy. She was on adjuvant hormonal therapy with tamoxifen 20 mg daily from October 2015 to May 2020, but due to elevation of the liver transaminases, was switched to anastrazole.   November 2022:  Found to have new bone metastases, with multiple marrow replacing lesions within the proximal right femur within the ischium as well as the inferior rami. 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 HER2 was at 3+ (was negative on her original cancer). Ki67 was 60%.   December:  PET/CT revealed intensely hypermetabolic aggressive skeletal metastasis involving the calvarium, sternum, thoracic and lumbar spine, and pelvis, with potential pathologic fracture of the right femur with internal fixation. There is soft tissue extension into the anterior mediastinum associated with the manubrial metastasis. Multiple spinal vertebral body lesions have cortical destruction along the posterior margin.   Began monthly Xgeva and completed radiation therapy.   September 22 2021 through Dec 22 2021- Received 5 cycles of THP.  Cycle 4 was delayed and dose reduction made  by 20% due to left leg skin infection. She continued with Herceptin/Perjeta   January 2024- Disease progression.  Presented with increased left hip pain and found to have innumerable metastatic lesions throughout the pelvis and proximal femur. There is a non displaced  pathologic fracture through a large lesion in the superior left acetabulum. [facility] recommended radiation and no surgery. She received palliative XRT.  Antiestrogen therapy changed to fulvestrant  October 12 2022:  Began Enhertu with Fulvestrant and Rivka Barbara continued.  Course of Enhertu has been complicated by nausea, emesis, diarrhea and hypkalemia.    July 2024- CT showed decreased soft tissue thickening around the left subclavian structures and thoracic inlet. Left lateral breast mass was stable. Widespread mixed lytic and sclerotic osseous metastatic lesions were stable. There was unchanged severe elevation of the left hemidiaphragm.   May 27 2023:  CT neck-  No soft tissue metastatic disease identified in the Neck.  Extensive mixed lytic and sclerotic skeletal metastases, much more pronounced in the visible upper thorax than in the cervical spine, and grossly stable from a July Chest CT.   June 07 2023:  Cardiac ECHO LVEF 55%  July 05 2023- CT CAP- Persistent elevation of the left hemidiaphragm with atelectasis within the left lower lobe and lingula. 13 mm lateral left breast nodule is unchanged. Extensive osseous metastases are again noted.  Exophytic cysts extending from the lower pole of the right kidney is again noted measuring up to 15.9 cm.   July 10 2023:  Scheduled follow up for metastatic breast cancer.  Reviewed results of imaging.  Has blisters on legs which began about 10 days ago.  This is related to chemotherapy which and responds to wrapping by wound care.  She will wound care today.       Cycle 14 Enhertu due today  Review of Systems  Constitutional:  Positive for fatigue. Negative for appetite change, chills, fever and unexpected weight change.  HENT:   Negative for mouth sores, nosebleeds and sore throat.   Eyes:  Negative for eye problems and icterus.       Vision changes:  None  Respiratory:  Negative for chest tightness, cough,  hemoptysis, shortness of breath and wheezing.        PND:  none Orthopnea:  none DOE:    Cardiovascular:  Negative for chest pain, leg swelling and palpitations.       PND:  none Orthopnea:  none  Gastrointestinal:  Positive for constipation. Negative for abdominal pain, blood in stool, diarrhea, nausea and vomiting.  Endocrine: Negative for hot flashes.       Cold intolerance:  none Heat intolerance:  none  Musculoskeletal:  Positive for gait problem. Negative for arthralgias, back pain, myalgias, neck pain and neck stiffness.  Skin:  Positive for wound. Negative for itching and rash.  Neurological:  Positive for extremity weakness and gait problem. Negative for dizziness, numbness, seizures and speech difficulty.  Hematological:  Negative for adenopathy. Does not bruise/bleed easily.    MEDICAL HISTORY: 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/2023   Bipolar disorder (HCC) 11/29/2020   Breast cancer (HCC)    Cancer (HCC)    Cardiac murmur    Chest pain    Chronic fatigue syndrome 11/29/2020   Depression    Essential hypertension  11/29/2020   Hepatic steatosis determined by biopsy of liver 07/04/2020   HLD (hyperlipidemia)    Hypertension    Hypomagnesemia 03/26/2023   Hypothyroidism 11/29/2020   Insomnia 11/29/2020   Iron deficiency anemia 06/05/2022   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   Migraine 11/29/2020   Mild intermittent asthma 11/29/2020   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   Mixed hyperlipidemia 11/29/2020   Obesity due to excess calories    Osteopenia after menopause 07/04/2020   Other long term (current) drug therapy 11/29/2020   Other vitamin B12 deficiency anemias 11/29/2020   Personal history of  malignant neoplasm of breast 11/29/2020   Pre-diabetes    Renal cyst, acquired, right 07/04/2020   14.5 cm in October 2021   Retrocalcaneal bursitis (back of heel), left 04/22/2018   Seasonal allergic rhinitis 11/29/2020   Tightness of heel cord, left 04/22/2018   Type 2 diabetes mellitus without complications (HCC) 11/29/2020   Vitamin D deficiency 11/29/2020    SURGICAL HISTORY: Past Surgical History:  Procedure Laterality Date   CESAREAN SECTION     IR BONE TUMOR(S)RF ABLATION  05/29/2022   IR BONE TUMOR(S)RF ABLATION  05/29/2022   IR KYPHO EA ADDL LEVEL THORACIC OR LUMBAR  05/29/2022   IR KYPHO THORACIC WITH BONE BIOPSY  05/29/2022   IR RADIOLOGIST EVAL & MGMT  05/22/2022   IR RADIOLOGIST EVAL & MGMT  06/12/2022   LIVER BIOPSY  01/2019   lumpectomy  2014   Left Breast   PORT-A-CATH REMOVAL     PORTACATH PLACEMENT  08/28/2013   STOMACH SURGERY     Tummy Tuck   TOTAL HIP ARTHROPLASTY Right 07/04/2021   Procedure: TOTAL HIP ARTHROPLASTY;  Surgeon: Durene Romans, MD;  Location: [facility];  Service: Orthopedics;  Laterality: Right;  90   WISDOM TOOTH EXTRACTION      SOCIAL HISTORY: Social History   Socioeconomic History   Marital status: Married    Spouse name: Not on file   Number of children: Not on file   Years of education: Not on file   Highest education level: Not on file  Occupational History   Not on file  Tobacco Use   Smoking status: Former    Current packs/day: 0.00    Types: Cigarettes    Quit date: 2004    Years since quitting: 20.9   Smokeless tobacco: Never  Vaping Use   Vaping status: Never Used  Substance and Sexual Activity   Alcohol use: Yes    Comment: occasionally   Drug use: Never   Sexual activity: Not on file  Other Topics Concern   Not on file  Social History Narrative   Not on file   Social Determinants of Health   Financial Resource Strain: Not on file  Food Insecurity: Not on file  Transportation Needs: Not on file  Physical  Activity: Not on file  Stress: Not on file  Social Connections: Not on file  Intimate Partner Violence: Not on file    FAMILY HISTORY Family History  Problem Relation Age of Onset   Hyperlipidemia Mother    Diabetes Father    Stroke Father    Colon cancer Neg Hx    Colon polyps Neg Hx    Esophageal cancer Neg Hx    Rectal cancer Neg Hx    Stomach cancer Neg Hx     ALLERGIES:  is allergic to reglan [metoclopramide].  MEDICATIONS:  Current Outpatient Medications  Medication  Sig Dispense Refill   albuterol (VENTOLIN HFA) 108 (90 Base) MCG/ACT inhaler Inhale 2 puffs into the lungs every 6 (six) hours as needed for shortness of breath.     ARIPiprazole (ABILIFY) 5 MG tablet Take 2.5 mg by mouth daily.     aspirin 81 MG EC tablet Take 81 mg by mouth daily. Swallow whole.     atorvastatin (LIPITOR) 20 MG tablet TAKE ONE TABLET BY MOUTH EVERY DAY 90 tablet 0   augmented betamethasone dipropionate (DIPROLENE-AF) 0.05 % cream      dapagliflozin propanediol (FARXIGA) 10 MG TABS tablet TAKE ONE TABLET BY MOUTH EVERY DAY 30 tablet 10   desvenlafaxine (PRISTIQ) 50 MG 24 hr tablet Take 50 mg by mouth daily.     dexamethasone (DECADRON) 4 MG tablet Take 1 tablet (4 mg total) by mouth 2 (two) times daily. For 5 days after chemotherapy 20 tablet 2   diphenoxylate-atropine (LOMOTIL) 2.5-0.025 MG tablet TAKE TWO TABLETS BY MOUTH FOUR TIMES A DAY AS NEEDED FOR DIARRHEA OR LOOSE STOOLS 100 tablet 1   labetalol (NORMODYNE) 100 MG tablet Take 1 tablet (100 mg total) by mouth 2 (two) times daily. 180 tablet 3   loratadine (CLARITIN) 10 MG tablet TAKE ONE TABLET BY MOUTH DAILY 30 tablet 2   LORazepam (ATIVAN) 1 MG tablet TAKE ONE TABLET BY MOUTH EVERY 8 HOURS 30 tablet 1   magic mouthwash (lidocaine, diphenhydrAMINE, alum & mag hydroxide) suspension Take 5 mLs by mouth every 3 (three) hours as needed. Swish and spit; for throat/mouth pain     naloxone (NARCAN) nasal spray 4 mg/0.1 mL One spray in nostril  as needed, may repeat every 2 to 3 minutes until medical assistance becomes available 4 each 1   nitroGLYCERIN (NITROSTAT) 0.4 MG SL tablet Place 0.4 mg under the tongue every 5 (five) minutes as needed for chest pain.     nystatin (MYCOSTATIN/NYSTOP) powder Apply 1 Application topically 2 (two) times daily.     omeprazole (PRILOSEC) 40 MG capsule Take 1 capsule (40 mg total) by mouth at bedtime. 30 capsule 5   ondansetron (ZOFRAN) 8 MG tablet Take 1 tablet (8 mg total) by mouth every 8 (eight) hours as needed for nausea or vomiting. 60 tablet 5   Oxycodone HCl 10 MG TABS TAKE ONE TABLET BY MOUTH EVERY 4 HOURS AS NEEDED FOR PAIN 180 tablet 0   Potassium Chloride 10 % SOLN Take 20 mEq by mouth 2 (two) times daily. (Patient taking differently: Take 20 mEq by mouth 2 (two) times daily.) 900 mL 2   prochlorperazine (COMPAZINE) 10 MG tablet TAKE ONE TABLET BY MOUTH EVERY 6 HOURS AS NEEDED FOR NAUSEA/VOMITING 90 tablet 3   potassium chloride SA (KLOR-CON M) 20 MEQ tablet Take 1 tablet (20 mEq total) by mouth 2 (two) times daily. 60 tablet 1   Current Facility-Administered Medications  Medication Dose Route Frequency Provider Last Rate Last Admin   technetium sestamibi generic (CARDIOLITE) injection 10.5 millicurie  10.5 millicurie Intravenous Once PRN Revankar, Aundra Dubin, MD       Facility-Administered Medications Ordered in Other Visits  Medication Dose Route Frequency Provider Last Rate Last Admin   acetaminophen (TYLENOL) tablet 650 mg  650 mg Oral Once Dellia Beckwith, MD       diphenhydrAMINE (BENADRYL) capsule 50 mg  50 mg Oral Once Dellia Beckwith, MD        PHYSICAL EXAMINATION:  ECOG PERFORMANCE STATUS: 1 - Symptomatic but completely ambulatory   Vitals:  07/10/23 0849  BP: 107/68  Pulse: 97  Resp: 12  Temp: 98.9 F (37.2 C)  SpO2: 95%    Filed Weights   07/10/23 0849  Weight: 125 lb 4.8 oz (56.8 kg)     Physical Exam Vitals and nursing note reviewed.   Constitutional:      Appearance: Normal appearance. She is normal weight. She is not toxic-appearing or diaphoretic.     Comments: Here with husband.    HENT:     Head: Normocephalic and atraumatic.     Right Ear: External ear normal.     Left Ear: External ear normal.     Nose: Nose normal. No congestion or rhinorrhea.  Eyes:     General: No scleral icterus.    Extraocular Movements: Extraocular movements intact.     Conjunctiva/sclera: Conjunctivae normal.     Pupils: Pupils are equal, round, and reactive to light.  Cardiovascular:     Rate and Rhythm: Normal rate.     Heart sounds: No murmur heard.    No friction rub. No gallop.  Abdominal:     General: Bowel sounds are normal.     Palpations: Abdomen is soft.     Tenderness: There is no abdominal tenderness. There is no guarding or rebound.  Musculoskeletal:        General: No swelling, tenderness or deformity.     Cervical back: Normal range of motion and neck supple. No rigidity or tenderness.  Lymphadenopathy:     Head:     Right side of head: No submental, submandibular, tonsillar, preauricular, posterior auricular or occipital adenopathy.     Left side of head: No submental, submandibular, tonsillar, preauricular, posterior auricular or occipital adenopathy.     Cervical: No cervical adenopathy.     Right cervical: No superficial, deep or posterior cervical adenopathy.    Left cervical: No superficial, deep or posterior cervical adenopathy.     Upper Body:     Right upper body: No supraclavicular, axillary, pectoral or epitrochlear adenopathy.     Left upper body: No supraclavicular, axillary, pectoral or epitrochlear adenopathy.  Skin:    General: Skin is warm.     Coloration: Skin is not jaundiced.     Comments: Shallow ulcers both pretibial regions  Neurological:     General: No focal deficit present.     Mental Status: She is alert and oriented to person, place, and time.     Cranial Nerves: No cranial nerve  deficit.     Comments: Stooped gait  Psychiatric:        Mood and Affect: Mood normal.        Behavior: Behavior normal.        Thought Content: Thought content normal.        Judgment: Judgment normal.      LABORATORY DATA: I have personally reviewed the data as listed:  Infusion on 07/10/2023  Component Date Value Ref Range Status   Ferritin 07/10/2023 305  11 - 307 ng/mL Final   Performed at [facility], [address], [place], [place] [address]   Folate 07/10/2023 9.2  >5.9 ng/mL Final   Performed at [facility], [address], [place], [place] [address]   Vitamin B-12 07/10/2023 224  180 - 914 pg/mL Final   Comment: (NOTE) This assay is not validated for testing neonatal or myeloproliferative syndrome specimens for Vitamin B12 levels. Performed at [facility], [address] [place], [place]  [address]    Iron 07/10/2023 24 (L)  28 - 170 ug/dL Final   TIBC 40/98/1191 225 (L)  250 - 450 ug/dL Final   Saturation Ratios 07/10/2023 11  10.4 - 31.8 % Final   UIBC 07/10/2023 201  ug/dL Final   Performed at [facility], [address], [place], [place] [address]   LDH 07/10/2023 171  98 - 192 U/L Final   Performed at [facility], [address], [place], [place], [address]   TSH 07/10/2023 3.062  0.350 - 4.500 uIU/mL Final   Comment: Performed by a 3rd Generation assay with a functional sensitivity of <=0.01 uIU/mL. Performed at [facility], [address], [place], [place] [address]    Sodium 07/10/2023 143  135 - 145 mmol/L Final   Potassium 07/10/2023 3.3 (L)  3.5 - 5.1 mmol/L Final   Chloride 07/10/2023 104  98 - 111 mmol/L Final   CO2 07/10/2023 29  22 - 32 mmol/L Final   Glucose, Bld 07/10/2023 127 (H)  70 - 99 mg/dL Final   Glucose reference range applies only to samples taken after fasting for at least 8 hours.   BUN 07/10/2023 9  6 - 20  mg/dL Final   Creatinine 84/69/6295 0.70  0.44 - 1.00 mg/dL Final   Calcium 28/41/3244 9.2  8.9 - 10.3 mg/dL Final   Total Protein 08/15/7251 5.5 (L)  6.5 - 8.1 g/dL Final   Albumin 66/44/0347 3.5  3.5 - 5.0 g/dL Final   AST 42/59/5638 15  15 - 41 U/L Final   ALT 07/10/2023 13  0 - 44 U/L Final   Alkaline Phosphatase 07/10/2023 77  38 - 126 U/L Final   Total Bilirubin 07/10/2023 0.3  <1.2 mg/dL Final   GFR, Estimated 07/10/2023 >60  >60 mL/min Final   Comment: (NOTE) Calculated using the CKD-EPI Creatinine Equation (2021)    Anion gap 07/10/2023 10  5 - 15 Final   Performed at [facility], [address] [place], [place], [address]   WBC Count 07/10/2023 4.6  4.0 - 10.5 K/uL Final   RBC 07/10/2023 3.42 (L)  3.87 - 5.11 MIL/uL Final   Hemoglobin 07/10/2023 9.7 (L)  12.0 - 15.0 g/dL Final   HCT 32/95/1884 30.9 (L)  36.0 - 46.0 % Final   MCV 07/10/2023 90.4  80.0 - 100.0 fL Final   MCH 07/10/2023 28.4  26.0 - 34.0 pg Final   MCHC 07/10/2023 31.4  30.0 - 36.0 g/dL Final   RDW 16/60/6301 17.2 (H)  11.5 - 15.5 % Final   Platelet Count 07/10/2023 360  150 - 400 K/uL Final   nRBC 07/10/2023 0.00  0.0 - 0.2 % Final   Neutrophils Relative % 07/10/2023 69  % Final   Neutro Abs 07/10/2023 3.2  1.7 - 7.7 K/uL Final   Lymphocytes Relative 07/10/2023 10  % Final   Lymphs Abs 07/10/2023 0.5 (L)  0.7 - 4.0 K/uL Final   Monocytes Relative 07/10/2023 15  % Final   Monocytes Absolute 07/10/2023 0.7  0.1 - 1.0 K/uL Final   Eosinophils Relative 07/10/2023 5  % Final   Eosinophils Absolute 07/10/2023 0.3  0.0 - 0.5 K/uL Final   Basophils Relative 07/10/2023 1  % Final   Basophils Absolute 07/10/2023 0.0  0.0 - 0.1 K/uL Final   Immature Granulocytes 07/10/2023 0  % Final   Abs Immature Granulocytes 07/10/2023 0.02  0.00 - 0.07  K/uL Final   nRBC 07/10/2023 0  0 /100 WBC Final   Performed at [facility] at [facility], [address] [place], [place], [address]   Appointment on 06/21/2023  Component Date Value Ref Range Status   WBC Count 06/21/2023 5.4  4.0 - 10.5 K/uL Final   RBC 06/21/2023 3.73 (L)  3.87 - 5.11 MIL/uL Final   Hemoglobin 06/21/2023 10.7 (L)  12.0 - 15.0 g/dL Final   HCT 60/45/4098 33.3 (L)  36.0 - 46.0 % Final   MCV 06/21/2023 89.3  80.0 - 100.0 fL Final   MCH 06/21/2023 28.7  26.0 - 34.0 pg Final   MCHC 06/21/2023 32.1  30.0 - 36.0 g/dL Final   RDW 11/91/4782 16.6 (H)  11.5 - 15.5 % Final   Platelet Count 06/21/2023 343  150 - 400 K/uL Final   nRBC 06/21/2023 0.00  0.0 - 0.2 % Final   Neutrophils Relative % 06/21/2023 73  % Final   Neutro Abs 06/21/2023 4.0  1.7 - 7.7 K/uL Final   Lymphocytes Relative 06/21/2023 9  % Final   Lymphs Abs 06/21/2023 0.5 (L)  0.7 - 4.0 K/uL Final   Monocytes Relative 06/21/2023 13  % Final   Monocytes Absolute 06/21/2023 0.7  0.1 - 1.0 K/uL Final   Eosinophils Relative 06/21/2023 4  % Final   Eosinophils Absolute 06/21/2023 0.2  0.0 - 0.5 K/uL Final   Basophils Relative 06/21/2023 1  % Final   Basophils Absolute 06/21/2023 0.0  0.0 - 0.1 K/uL Final   Immature Granulocytes 06/21/2023 0  % Final   Abs Immature Granulocytes 06/21/2023 0.02  0.00 - 0.07 K/uL Final   nRBC 06/21/2023 0  0 /100 WBC Final   Performed at [facility], [address] [place], [place], [address]   Sodium 06/21/2023 144  135 - 145 mmol/L Final   Potassium 06/21/2023 2.9 (L)  3.5 - 5.1 mmol/L Final   Chloride 06/21/2023 104  98 - 111 mmol/L Final   CO2 06/21/2023 30  22 - 32 mmol/L Final   Glucose, Bld 06/21/2023 93  70 - 99 mg/dL Final   Glucose reference range applies only to samples taken after fasting for at least 8 hours.   BUN 06/21/2023 7  6 - 20 mg/dL Final   Creatinine 30/86/5784 0.56  0.44 - 1.00 mg/dL Final   Calcium 69/62/9528 9.2  8.9 - 10.3 mg/dL Final   Total Protein 41/32/4401 5.7 (L)  6.5 - 8.1 g/dL Final   Albumin 02/72/5366 3.6  3.5 - 5.0 g/dL Final   AST 44/10/4740 21   15 - 41 U/L Final   ALT 06/21/2023 17  0 - 44 U/L Final   Alkaline Phosphatase 06/21/2023 71  38 - 126 U/L Final   Total Bilirubin 06/21/2023 0.2  <1.2 mg/dL Final   GFR, Estimated 06/21/2023 >60  >60 mL/min Final   Comment: (NOTE) Calculated using the CKD-EPI Creatinine Equation (2021)    Anion gap 06/21/2023 10  5 - 15 Final   Performed at [facility], [address] [place], [place], [address]   Magnesium 06/21/2023 1.8  1.7 - 2.4 mg/dL Final   Performed at [facility], [address] [place], [place], [address]    RADIOGRAPHIC STUDIES: I have personally reviewed the radiological images as listed and agree with the findings in the report  No results found.  ASSESSMENT/PLAN  52 y.o. female is  here because of  metastatic breast cancer  Stage IV, ER 95%/ PR 40%/ Her 2 neu 3+ breast cancer  Patient was diagnosed with stage IV breast cancer in November 2022.  She has Stage IV disease by virtue of extensive skeletal metastasis.    She is currently being treated with Enhertu/Fulvestrant/ Xgeva.    To proceed with Cycle 14 Enhertu today  Skin lesions July 10 2023- Has shallow ulcers on both pretibial regions which have been managed by wound care.  Will refer back  to them for management. Offered patient the option of holding Enhertu today due to these lesions.  She states that lesions have healed well with local care even in the setting of receiving chemotherapy therefore will proceed with Enhertu today.  Hypokalemia  July 10 2023- Serum potassium 3.3 today.  To start K Dur 20 mEq po daily.     Cancer Staging  Malignant neoplasm of upper-outer quadrant of left female breast [facility]) Staging form: Breast, AJCC 7th Edition - Clinical stage from 07/23/2013: Stage IIB (T2, N1, M0) - Signed by Dellia Beckwith, MD on 07/04/2020 Staged by: Managing physician Diagnostic confirmation: Positive histology Specimen type:  Excision Histopathologic type: Infiltrating duct carcinoma, NOS Stage prefix: Initial diagnosis Laterality: Left Tumor size (mm): 21 Method of lymph node assessment: Sentinel lymph node biopsy Paget's disease: Negative Tumor grade (Scarff-Bloom-Richardson system): G3 Estrogen receptor status: Positive Progesterone receptor status: Positive HER2 status: Negative Multi-gene signature risk of recurrence: Not assessed Prognostic indicators: Pos LVI Stage used in treatment planning: Yes National guidelines used in treatment planning: Yes Type of national guideline used in treatment planning: NCCN    No problem-specific Assessment & Plan notes found for this encounter.    Orders Placed This Encounter  Procedures   CA 27.29    Standing Status:   Future    Standing Expiration Date:   07/09/2024    60  minutes was spent in patient care.  This included time spent preparing to see the patient (e.g., review of tests), obtaining and/or reviewing separately obtained history, counseling and educating the patient/family/caregiver, ordering medications, tests, or procedures; documenting clinical information in the electronic or other health record, independently interpreting results and communicating results to the patient/family/caregiver as well as coordination of care.       All questions were answered. The patient knows to call the clinic with any problems, questions or concerns.  This note was electronically signed.    Loni Muse, MD  07/10/2023 4:07 PM

## 2023-07-10 NOTE — Patient Instructions (Signed)
Fam-Trastuzumab Deruxtecan Injection What is this medication? FAM-TRASTUZUMAB DERUXTECAN (fam-tras TOOZ eu mab DER ux TEE kan) treats some types of cancer. It works by blocking a protein that causes cancer cells to grow and multiply. This helps to slow or stop the spread of cancer cells. This medicine may be used for other purposes; ask your health care provider or pharmacist if you have questions. COMMON BRAND NAME(S): ENHERTU What should I tell my care team before I take this medication? They need to know if you have any of these conditions: Heart disease Heart failure Infection, especially a viral infection, such as chickenpox, cold sores, or herpes Liver disease Lung or breathing disease, such as asthma or COPD An unusual or allergic reaction to fam-trastuzumab deruxtecan, other medications, foods, dyes, or preservatives Pregnant or trying to get pregnant Breast-feeding How should I use this medication? This medication is injected into a vein. It is given by your care team in a hospital or clinic setting. A special MedGuide will be given to you before each treatment. Be sure to read this information carefully each time. Talk to your care team about the use of this medication in children. Special care may be needed. Overdosage: If you think you have taken too much of this medicine contact a poison control center or emergency room at once. NOTE: This medicine is only for you. Do not share this medicine with others. What if I miss a dose? It is important not to miss your dose. Call your care team if you are unable to keep an appointment. What may interact with this medication? Interactions are not expected. This list may not describe all possible interactions. Give your health care provider a list of all the medicines, herbs, non-prescription drugs, or dietary supplements you use. Also tell them if you smoke, drink alcohol, or use illegal drugs. Some items may interact with your  medicine. What should I watch for while using this medication? Visit your care team for regular checks on your progress. Tell your care team if your symptoms do not start to get better or if they get worse. Your condition will be monitored carefully while you are receiving this medication. Do not become pregnant while taking this medication or for 7 months after stopping it. Women should inform their care team if they wish to become pregnant or think they might be pregnant. Men should not father a child while taking this medication and for 4 months after stopping it. There is potential for serious side effects to an unborn child. Talk to your care team for more information. Do not breast-feed an infant while taking this medication or for 7 months after the last dose. This medication has caused decreased sperm counts in some men. This may make it more difficult to father a child. Talk to your care team if you are concerned about your fertility. This medication may increase your risk to bruise or bleed. Call your care team if you notice any unusual bleeding. Be careful brushing or flossing your teeth or using a toothpick because you may get an infection or bleed more easily. If you have any dental work done, tell your dentist you are receiving this medication. This medication may cause dry eyes and blurred vision. If you wear contact lenses, you may feel some discomfort. Lubricating eye drops may help. See your care team if the problem does not go away or is severe. This medication may increase your risk of getting an infection. Call your care team for  advice if you get a fever, chills, sore throat, or other symptoms of a cold or flu. Do not treat yourself. Try to avoid being around people who are sick. Avoid taking medications that contain aspirin, acetaminophen, ibuprofen, naproxen, or ketoprofen unless instructed by your care team. These medications may hide a fever. What side effects may I notice from  receiving this medication? Side effects that you should report to your care team as soon as possible: Allergic reactions--skin rash, itching, hives, swelling of the face, lips, tongue, or throat Dry cough, shortness of breath or trouble breathing Infection--fever, chills, cough, sore throat, wounds that don't heal, pain or trouble when passing urine, general feeling of discomfort or being unwell Heart failure--shortness of breath, swelling of the ankles, feet, or hands, sudden weight gain, unusual weakness or fatigue Unusual bruising or bleeding Side effects that usually do not require medical attention (report these to your care team if they continue or are bothersome): Constipation Diarrhea Hair loss Muscle pain Nausea Vomiting This list may not describe all possible side effects. Call your doctor for medical advice about side effects. You may report side effects to FDA at 1-800-FDA-1088. Where should I keep my medication? This medication is given in a hospital or clinic. It will not be stored at home. NOTE: This sheet is a summary. It may not cover all possible information. If you have questions about this medicine, talk to your doctor, pharmacist, or health care provider.  2024 Elsevier/Gold Standard (2021-05-16 00:00:00)

## 2023-07-12 LAB — HAPTOGLOBIN: Haptoglobin: 356 mg/dL — ABNORMAL HIGH (ref 33–346)

## 2023-07-20 ENCOUNTER — Other Ambulatory Visit: Payer: Self-pay | Admitting: Cardiology

## 2023-07-20 ENCOUNTER — Other Ambulatory Visit: Payer: Self-pay | Admitting: Hematology and Oncology

## 2023-07-20 DIAGNOSIS — C7951 Secondary malignant neoplasm of bone: Secondary | ICD-10-CM

## 2023-07-20 DIAGNOSIS — R0789 Other chest pain: Secondary | ICD-10-CM

## 2023-07-20 DIAGNOSIS — S22078A Other fracture of T9-T10 vertebra, initial encounter for closed fracture: Secondary | ICD-10-CM

## 2023-07-20 DIAGNOSIS — M545 Low back pain, unspecified: Secondary | ICD-10-CM

## 2023-07-22 ENCOUNTER — Encounter: Payer: Self-pay | Admitting: Oncology

## 2023-07-23 ENCOUNTER — Encounter: Payer: Self-pay | Admitting: Cardiology

## 2023-07-31 ENCOUNTER — Encounter: Payer: Self-pay | Admitting: Hematology and Oncology

## 2023-08-01 ENCOUNTER — Encounter: Payer: Self-pay | Admitting: Oncology

## 2023-08-02 ENCOUNTER — Inpatient Hospital Stay: Payer: BC Managed Care – PPO | Attending: Hematology and Oncology | Admitting: Hematology and Oncology

## 2023-08-02 ENCOUNTER — Inpatient Hospital Stay: Payer: BC Managed Care – PPO

## 2023-08-02 ENCOUNTER — Encounter: Payer: Self-pay | Admitting: Hematology and Oncology

## 2023-08-02 ENCOUNTER — Other Ambulatory Visit: Payer: Self-pay | Admitting: Pharmacist

## 2023-08-02 ENCOUNTER — Encounter: Payer: Self-pay | Admitting: Oncology

## 2023-08-02 VITALS — BP 98/69 | HR 98 | Temp 99.0°F | Resp 18 | Ht <= 58 in | Wt 121.4 lb

## 2023-08-02 DIAGNOSIS — Z170 Estrogen receptor positive status [ER+]: Secondary | ICD-10-CM

## 2023-08-02 DIAGNOSIS — E876 Hypokalemia: Secondary | ICD-10-CM | POA: Diagnosis not present

## 2023-08-02 DIAGNOSIS — F411 Generalized anxiety disorder: Secondary | ICD-10-CM

## 2023-08-02 DIAGNOSIS — Z5112 Encounter for antineoplastic immunotherapy: Secondary | ICD-10-CM | POA: Diagnosis not present

## 2023-08-02 DIAGNOSIS — G4700 Insomnia, unspecified: Secondary | ICD-10-CM

## 2023-08-02 DIAGNOSIS — D630 Anemia in neoplastic disease: Secondary | ICD-10-CM | POA: Insufficient documentation

## 2023-08-02 DIAGNOSIS — C801 Malignant (primary) neoplasm, unspecified: Secondary | ICD-10-CM

## 2023-08-02 DIAGNOSIS — Z5111 Encounter for antineoplastic chemotherapy: Secondary | ICD-10-CM | POA: Diagnosis present

## 2023-08-02 DIAGNOSIS — C7951 Secondary malignant neoplasm of bone: Secondary | ICD-10-CM

## 2023-08-02 DIAGNOSIS — R251 Tremor, unspecified: Secondary | ICD-10-CM | POA: Diagnosis not present

## 2023-08-02 DIAGNOSIS — C50412 Malignant neoplasm of upper-outer quadrant of left female breast: Secondary | ICD-10-CM

## 2023-08-02 DIAGNOSIS — H538 Other visual disturbances: Secondary | ICD-10-CM

## 2023-08-02 HISTORY — DX: Tremor, unspecified: R25.1

## 2023-08-02 LAB — CBC WITH DIFFERENTIAL (CANCER CENTER ONLY)
Abs Immature Granulocytes: 0.02 K/uL (ref 0.00–0.07)
Basophils Absolute: 0 K/uL (ref 0.0–0.1)
Basophils Relative: 1 %
Eosinophils Absolute: 0.2 K/uL (ref 0.0–0.5)
Eosinophils Relative: 4 %
HCT: 33.2 % — ABNORMAL LOW (ref 36.0–46.0)
Hemoglobin: 10.4 g/dL — ABNORMAL LOW (ref 12.0–15.0)
Immature Granulocytes: 0 %
Lymphocytes Relative: 10 %
Lymphs Abs: 0.6 K/uL — ABNORMAL LOW (ref 0.7–4.0)
MCH: 27.7 pg (ref 26.0–34.0)
MCHC: 31.3 g/dL (ref 30.0–36.0)
MCV: 88.5 fL (ref 80.0–100.0)
Monocytes Absolute: 0.7 K/uL (ref 0.1–1.0)
Monocytes Relative: 12 %
Neutro Abs: 4.1 K/uL (ref 1.7–7.7)
Neutrophils Relative %: 73 %
Platelet Count: 373 K/uL (ref 150–400)
RBC: 3.75 MIL/uL — ABNORMAL LOW (ref 3.87–5.11)
RDW: 17.7 % — ABNORMAL HIGH (ref 11.5–15.5)
WBC Count: 5.6 K/uL (ref 4.0–10.5)
nRBC: 0 % (ref 0.0–0.2)
nRBC: 0 /100{WBCs}

## 2023-08-02 LAB — CMP (CANCER CENTER ONLY)
ALT: 8 U/L (ref 0–44)
AST: 14 U/L — ABNORMAL LOW (ref 15–41)
Albumin: 3.6 g/dL (ref 3.5–5.0)
Alkaline Phosphatase: 83 U/L (ref 38–126)
Anion gap: 11 (ref 5–15)
BUN: 20 mg/dL (ref 6–20)
CO2: 28 mmol/L (ref 22–32)
Calcium: 9.3 mg/dL (ref 8.9–10.3)
Chloride: 100 mmol/L (ref 98–111)
Creatinine: 0.59 mg/dL (ref 0.44–1.00)
GFR, Estimated: >60 mL/min
Glucose, Bld: 162 mg/dL — ABNORMAL HIGH (ref 70–99)
Potassium: 4 mmol/L (ref 3.5–5.1)
Sodium: 139 mmol/L (ref 135–145)
Total Bilirubin: 0.3 mg/dL
Total Protein: 5.9 g/dL — ABNORMAL LOW (ref 6.5–8.1)

## 2023-08-02 MED ORDER — DEXAMETHASONE SODIUM PHOSPHATE 10 MG/ML IJ SOLN
10.0000 mg | Freq: Once | INTRAMUSCULAR | Status: AC
Start: 2023-08-02 — End: 2023-08-02
  Administered 2023-08-02: 10 mg via INTRAVENOUS
  Filled 2023-08-02: qty 1

## 2023-08-02 MED ORDER — LORAZEPAM 1 MG PO TABS: 1.0000 mg | ORAL_TABLET | Freq: Three times a day (TID) | ORAL | 1 refills | Status: DC

## 2023-08-02 MED ORDER — FOSAPREPITANT DIMEGLUMINE INJECTION 150 MG
150.0000 mg | Freq: Once | INTRAVENOUS | Status: AC
  Administered 2023-08-02: 150 mg via INTRAVENOUS
  Filled 2023-08-02: qty 150

## 2023-08-02 MED ORDER — DEXTROSE 5 % IV SOLN: Freq: Once | INTRAVENOUS | Status: AC

## 2023-08-02 MED ORDER — FULVESTRANT 250 MG/5ML IM SOSY
500.0000 mg | PREFILLED_SYRINGE | Freq: Once | INTRAMUSCULAR | Status: AC
  Administered 2023-08-02: 500 mg via INTRAMUSCULAR
  Filled 2023-08-02: qty 10

## 2023-08-02 MED ORDER — SODIUM CHLORIDE 0.9% FLUSH
10.0000 mL | INTRAVENOUS | Status: DC | PRN
Start: 2023-08-02 — End: 2023-08-02
  Administered 2023-08-02: 10 mL

## 2023-08-02 MED ORDER — PALONOSETRON HCL INJECTION 0.25 MG/5ML
0.2500 mg | Freq: Once | INTRAVENOUS | Status: AC
Start: 2023-08-02 — End: 2023-08-02
  Administered 2023-08-02: 0.25 mg via INTRAVENOUS
  Filled 2023-08-02: qty 5

## 2023-08-02 MED ORDER — DIPHENHYDRAMINE HCL 25 MG PO CAPS
50.0000 mg | ORAL_CAPSULE | Freq: Once | ORAL | Status: DC
Start: 2023-08-02 — End: 2023-08-02
  Filled 2023-08-02: qty 2

## 2023-08-02 MED ORDER — DEXTROSE 5 % IV SOLN
5.3000 mg/kg | Freq: Once | INTRAVENOUS | Status: AC
  Administered 2023-08-02: 300 mg via INTRAVENOUS
  Filled 2023-08-02: qty 15

## 2023-08-02 MED ORDER — HEPARIN SOD (PORK) LOCK FLUSH 100 UNIT/ML IV SOLN
500.0000 [IU] | Freq: Once | INTRAVENOUS | Status: AC | PRN
Start: 2023-08-02 — End: 2023-08-02
  Administered 2023-08-02: 500 [IU]

## 2023-08-02 MED ORDER — ACETAMINOPHEN 325 MG PO TABS
650.0000 mg | ORAL_TABLET | Freq: Once | ORAL | Status: DC
  Filled 2023-08-02: qty 2

## 2023-08-02 NOTE — Assessment & Plan Note (Signed)
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 Ki67 was 60%. PET imaging in December revealed dominant finding of intensely hypermetabolic aggressive skeletal metastasis involving the calvarium, sternum, thoracic and lumbar spine, and pelvis, with potential pathologic fracture of the right femur with internal fixation. There was soft tissue extension into the anterior mediastinum associated with the manubrial metastasis. Multiple spinal vertebral body lesions had cortical destruction along the posterior margin. She started monthly Xgeva in December, and completed radiation therapy. She was receiving THP, but cycle 4 was delayed and dose reduction made by 20% due to left leg skin infection.   She continued with Herceptin/Perjeta until she had progression of disease. In January 2024, she developed increased pain of the left hip. She was found to have innumerable metastatic lesions throughout the pelvis and proximal femur. There is a non displaced pathologic fracture through a large lesion in the superior left acetabulum. She was referred to an orthopedic oncologist at [facility] and they recommended radiation and no surgery. She received her palliative radiation. She followed-up with [facility] in April and they do not recommend surgery, but will continue surveillance.   We then switched her to [facility] chemotherapy, but she has had significant toxicities of nausea, vomiting, diarrhea, and hypokalemia.  She was also placed on hormonal therapy with Faslodex injections.  Repeat CT imaging in July revealed decreased soft tissue thickening around the left subclavian structures and thoracic inlet.  Left lateral breast mass was stable.  Widespread mixed lytic and sclerotic  osseous metastatic lesions were stable.  There was unchanged severe elevation of the left hemidiaphragm.  She continues Xgeva for her bone metastases, which is being given every 6 weeks to coincide with chemotherapy.  CT neck in October to evaluate for possible soft tissue mass and painful swallowing did not reveal any soft tissue masses.  The extensive mixed lytic and sclerotic skeletal metastases were much more pronounced in the visible upper thorax than in the cervical spine and were felt to be grossly stable from July 2024. CT chest, abdomen and pelvis in November revealed stable bony disease without evidence of new/progressive disease.  Her pain remains fairly well-controlled on the current regimen.  She will proceed with 15th cycle of Enhertu today.  She will continue denosumab every 6 weeks, so will be due for that in 3 weeks. We will plan to see her back in 3 weeks with a CBC and comprehensive metabolic pane prior to a 16th cycle of Enhertu.

## 2023-08-02 NOTE — Patient Instructions (Signed)
Fulvestrant Injection What is this medication? FULVESTRANT (ful VES trant) treats breast cancer. It works by blocking the hormone estrogen in breast tissue, which prevents breast cancer cells from spreading or growing. This medicine may be used for other purposes; ask your health care provider or pharmacist if you have questions. COMMON BRAND NAME(S): FASLODEX What should I tell my care team before I take this medication? They need to know if you have any of these conditions: Bleeding disorder Liver disease Low blood cell levels, such as low white cells, red cells, and platelets An unusual or allergic reaction to fulvestrant, other medications, foods, dyes, or preservatives Pregnant or trying to get pregnant Breast-feeding How should I use this medication? This medication is injected into a muscle. It is given by your care team in a hospital or clinic setting. Talk to your care team about the use of this medication in children. Special care may be needed. Overdosage: If you think you have taken too much of this medicine contact a poison control center or emergency room at once. NOTE: This medicine is only for you. Do not share this medicine with others. What if I miss a dose? Keep appointments for follow-up doses. It is important not to miss your dose. Call your care team if you are unable to keep an appointment. What may interact with this medication? Certain medications that prevent or treat blood clots, such as warfarin, enoxaparin, dalteparin, apixaban, dabigatran, rivaroxaban This list may not describe all possible interactions. Give your health care provider a list of all the medicines, herbs, non-prescription drugs, or dietary supplements you use. Also tell them if you smoke, drink alcohol, or use illegal drugs. Some items may interact with your medicine. What should I watch for while using this medication? Your condition will be monitored carefully while you are receiving this  medication. You may need blood work while taking this medication. Talk to your care team if you may be pregnant. Serious birth defects can occur if you take this medication during pregnancy and for 1 year after the last dose. You will need a negative pregnancy test before starting this medication. Contraception is recommended while taking this medication and for 1 year after the last dose. Your care team can help you find the option that works for you. Do not breastfeed while taking this medication and for 1 year after the last dose. This medication may cause infertility. Talk to your care team if you are concerned about your fertility. What side effects may I notice from receiving this medication? Side effects that you should report to your care team as soon as possible: Allergic reactions or angioedema--skin rash, itching or hives, swelling of the face, eyes, lips, tongue, arms, or legs, trouble swallowing or breathing Pain, tingling, or numbness in the hands or feet Side effects that usually do not require medical attention (report to your care team if they continue or are bothersome): Bone, joint, or muscle pain Constipation Headache Hot flashes Nausea Pain, redness, or irritation at injection site Unusual weakness or fatigue This list may not describe all possible side effects. Call your doctor for medical advice about side effects. You may report side effects to FDA at 1-800-FDA-1088. Where should I keep my medication? This medication is given in a hospital or clinic. It will not be stored at home. NOTE: This sheet is a summary. It may not cover all possible information. If you have questions about this medicine, talk to your doctor, pharmacist, or health care provider.  2024 Elsevier/Gold Standard (2021-12-12 00:00:00) Fam-Trastuzumab Deruxtecan Injection What is this medication? FAM-TRASTUZUMAB DERUXTECAN (fam-tras TOOZ eu mab DER ux TEE kan) treats some types of cancer. It works by  blocking a protein that causes cancer cells to grow and multiply. This helps to slow or stop the spread of cancer cells. This medicine may be used for other purposes; ask your health care provider or pharmacist if you have questions. COMMON BRAND NAME(S): ENHERTU What should I tell my care team before I take this medication? They need to know if you have any of these conditions: Heart disease Heart failure Infection, especially a viral infection, such as chickenpox, cold sores, or herpes Liver disease Lung or breathing disease, such as asthma or COPD An unusual or allergic reaction to fam-trastuzumab deruxtecan, other medications, foods, dyes, or preservatives Pregnant or trying to get pregnant Breast-feeding How should I use this medication? This medication is injected into a vein. It is given by your care team in a hospital or clinic setting. A special MedGuide will be given to you before each treatment. Be sure to read this information carefully each time. Talk to your care team about the use of this medication in children. Special care may be needed. Overdosage: If you think you have taken too much of this medicine contact a poison control center or emergency room at once. NOTE: This medicine is only for you. Do not share this medicine with others. What if I miss a dose? It is important not to miss your dose. Call your care team if you are unable to keep an appointment. What may interact with this medication? Interactions are not expected. This list may not describe all possible interactions. Give your health care provider a list of all the medicines, herbs, non-prescription drugs, or dietary supplements you use. Also tell them if you smoke, drink alcohol, or use illegal drugs. Some items may interact with your medicine. What should I watch for while using this medication? Visit your care team for regular checks on your progress. Tell your care team if your symptoms do not start to get  better or if they get worse. Your condition will be monitored carefully while you are receiving this medication. Do not become pregnant while taking this medication or for 7 months after stopping it. Women should inform their care team if they wish to become pregnant or think they might be pregnant. Men should not father a child while taking this medication and for 4 months after stopping it. There is potential for serious side effects to an unborn child. Talk to your care team for more information. Do not breast-feed an infant while taking this medication or for 7 months after the last dose. This medication has caused decreased sperm counts in some men. This may make it more difficult to father a child. Talk to your care team if you are concerned about your fertility. This medication may increase your risk to bruise or bleed. Call your care team if you notice any unusual bleeding. Be careful brushing or flossing your teeth or using a toothpick because you may get an infection or bleed more easily. If you have any dental work done, tell your dentist you are receiving this medication. This medication may cause dry eyes and blurred vision. If you wear contact lenses, you may feel some discomfort. Lubricating eye drops may help. See your care team if the problem does not go away or is severe. This medication may increase your risk of getting an  infection. Call your care team for advice if you get a fever, chills, sore throat, or other symptoms of a cold or flu. Do not treat yourself. Try to avoid being around people who are sick. Avoid taking medications that contain aspirin, acetaminophen, ibuprofen, naproxen, or ketoprofen unless instructed by your care team. These medications may hide a fever. What side effects may I notice from receiving this medication? Side effects that you should report to your care team as soon as possible: Allergic reactions--skin rash, itching, hives, swelling of the face, lips,  tongue, or throat Dry cough, shortness of breath or trouble breathing Infection--fever, chills, cough, sore throat, wounds that don't heal, pain or trouble when passing urine, general feeling of discomfort or being unwell Heart failure--shortness of breath, swelling of the ankles, feet, or hands, sudden weight gain, unusual weakness or fatigue Unusual bruising or bleeding Side effects that usually do not require medical attention (report these to your care team if they continue or are bothersome): Constipation Diarrhea Hair loss Muscle pain Nausea Vomiting This list may not describe all possible side effects. Call your doctor for medical advice about side effects. You may report side effects to FDA at 1-800-FDA-1088. Where should I keep my medication? This medication is given in a hospital or clinic. It will not be stored at home. NOTE: This sheet is a summary. It may not cover all possible information. If you have questions about this medicine, talk to your doctor, pharmacist, or health care provider.  2024 Elsevier/Gold Standard (2021-05-16 00:00:00)

## 2023-08-02 NOTE — Assessment & Plan Note (Signed)
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 She was found to be iron deficient in September 2023 and was given IV iron supplement. The hemoglobin continues to fluctuate up and down. Repeat evaluation in November did not reveal recurrent iron deficiency. B12 was low normal, so I will have her take B12 500 mcg daily. Folate was normal.

## 2023-08-02 NOTE — Assessment & Plan Note (Signed)
New right hand/arm tremor of uncertain etiology, with occasional blurry vision. Will discuss with Dr. Gilman Buttner

## 2023-08-02 NOTE — Assessment & Plan Note (Signed)
Persistent hypokalemia.  She states she is unable to tolerate liquid potassium due to the taste nor potassium tablets due to difficulty swallowing them. She is using OTC potassium gummies, 500 mg, 2 twice daily.  I recommended that she put it in some juice and hopefully this will be better tolerated.  She will receive IV potassium today.

## 2023-08-02 NOTE — Progress Notes (Cosign Needed)
[facility] ([facility]) [facility]  [address] [place],  [place]  [address] [phone]  Clinic Day:  08/02/2023  Referring physician: Galvin Proffer, MD  ASSESSMENT & PLAN:   Assessment & Plan: Malignant neoplasm metastatic to bone [facility]) Bone metastases diagnosed in November 2022, with multiple marrow replacing lesions within the proximal right femur within the ischium as well as the inferior rami. 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 Ki67 was 60%. PET imaging in December revealed dominant finding of intensely hypermetabolic aggressive skeletal metastasis involving the calvarium, sternum, thoracic and lumbar spine, and pelvis, with potential pathologic fracture of the right femur with internal fixation. There was soft tissue extension into the anterior mediastinum associated with the manubrial metastasis. Multiple spinal vertebral body lesions had cortical destruction along the posterior margin. She started monthly Xgeva in December, and completed radiation therapy. She was receiving THP, but cycle 4 was delayed and dose reduction made by 20% due to left leg skin infection.   She continued with Herceptin/Perjeta until she had progression of disease. In January 2024, she developed increased pain of the left hip. She was found to have innumerable metastatic lesions throughout the pelvis and proximal femur. There is a non displaced pathologic fracture through a large lesion in the superior left acetabulum. She was referred to an orthopedic oncologist at [facility] and they recommended radiation and no surgery. She received her palliative radiation. She followed-up with [facility] in April and they do not recommend surgery, but will continue surveillance.   We then switched her to [facility] chemotherapy, but she has had significant toxicities of nausea, vomiting, diarrhea, and hypokalemia.  She was also placed  on hormonal therapy with Faslodex injections.  Repeat CT imaging in July revealed decreased soft tissue thickening around the left subclavian structures and thoracic inlet.  Left lateral breast mass was stable.  Widespread mixed lytic and sclerotic osseous metastatic lesions were stable.  There was unchanged severe elevation of the left hemidiaphragm.  She continues Xgeva for her bone metastases, which is being given every 6 weeks to coincide with chemotherapy.  CT neck in October to evaluate for possible soft tissue mass and painful swallowing did not reveal any soft tissue masses.  The extensive mixed lytic and sclerotic skeletal metastases were much more pronounced in the visible upper thorax than in the cervical spine and were felt to be grossly stable from July 2024. CT chest, abdomen and pelvis in November revealed stable bony disease without evidence of new/progressive disease.  Her pain remains fairly well-controlled on the current regimen.  She will proceed with 15th cycle of Enhertu today.  She will continue denosumab every 6 weeks, so will be due for that in 3 weeks. We will plan to see her back in 3 weeks with a CBC and comprehensive metabolic pane prior to a 16th cycle of Enhertu.  Hypokalemia Persistent hypokalemia.  She states she is unable to tolerate liquid potassium due to the taste nor potassium tablets due to difficulty swallowing them. She is using OTC potassium gummies, 500 mg, 2 twice daily.  I recommended that she put it in some juice and hopefully this will be better tolerated.  She will receive IV potassium today.  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 She was found to be iron deficient in September 2023 and was given IV iron supplement. The hemoglobin  continues to fluctuate up and down. Repeat evaluation in November did not reveal recurrent iron deficiency. B12 was low normal, so I will have her take B12 500  mcg daily. Folate was normal.  Tremor New right hand/arm tremor of uncertain etiology, with occasional blurry vision. Will discuss with Dr. Gilman Buttner    The patient understands the plans discussed today and is in agreement with them.  She knows to contact our office if she develops concerns prior to her next appointment.   I provided 40 minutes of face-to-face time during this encounter and > 50% was spent counseling as documented under my assessment and plan.    Adah Perl, PA-C  [facility] [place] - [facility] [address] [place] [address] Dept: [phone] Dept Fax: [phone]   No orders of the defined types were placed in this encounter.     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  Current Treatment:  Enhertu every 3 weeks  HISTORY OF PRESENT ILLNESS:   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 (HCC)   07/23/2013 Cancer Staging   Staging form: Breast, AJCC 7th Edition - Clinical stage from 07/23/2013: Stage IIB (T2, N1, M0) - Signed by Dellia Beckwith, MD on 07/04/2020 Prognostic indicators: Pos LVI   09/22/2021 - 04/06/2022 Chemotherapy   Patient is on Treatment Plan : BREAST  Trastuzumab + Pertuzumab q21d      09/22/2021 - 09/21/2022 Chemotherapy   Patient is on Treatment Plan : BREAST Trastuzumab + Pertuzumab q21d     10/12/2022 -  Chemotherapy   Patient is on Treatment Plan : BREAST METASTATIC Fam-Trastuzumab Deruxtecan-nxki (Enhertu) (5.4) q21d     Malignant neoplasm metastatic to bone (HCC)  06/28/2021 Initial Diagnosis   Bone metastases (HCC)   09/22/2021 - 04/06/2022 Chemotherapy   Patient is on Treatment Plan : BREAST  Trastuzumab + Pertuzumab q21d      09/22/2021 - 09/21/2022 Chemotherapy   Patient is  on Treatment Plan : BREAST Trastuzumab + Pertuzumab q21d     05/11/2022 Imaging   CT chest, abdomen and pelvis:  IMPRESSION:  1. Widespread bony metastatic disease shows increased sclerosis  since previous imaging. This is likely related to response to  therapy in the interval. There is a single lesion along the LEFT  iliac which shows continued lytic change and warrants attention on  follow-up  2. Interval development of pathologic fractures at in the upper  thoracic spine and at the thoracolumbar junction with associated  kyphosis.  3. No signs of solid organ or nodal metastatic disease.  4. Marked elevation of the LEFT hemidiaphragm as on the prior PET.  5. Aortic atherosclerosis.    10/12/2022 -  Chemotherapy   Patient is on Treatment Plan : BREAST METASTATIC Fam-Trastuzumab Deruxtecan-nxki (Enhertu) (5.4) q21d         INTERVAL HISTORY:  Anna [name] is here today for repeat clinical assessment.  At her last visit, she had recurrent hypokalemia, so was given potassium chloride again.  She was unable to tolerate this, so she is taking over-the-counter potassium chewable, two 500 mg chews twice daily.  She has a new left hand/arm tremor which she states has been present for about 2 weeks.  She reports occasional blurry vision.  She reports feeling unsteady on her  feet.  She reports stable neuropathy.  She denies fevers or chills. She states her pain is fairly well-controlled with the current regimen. Her appetite is fairly good. Her weight has decreased 4 pounds over last 3 weeks .  She has persistent lower extremity ulcers, which are being managed by wound center.  Her legs are wrapped.  REVIEW OF SYSTEMS:  Review of Systems  Constitutional:  Negative for appetite change, chills, fatigue, fever and unexpected weight change.  HENT:   Positive for nosebleeds (occasional, not severe). Negative for lump/mass, mouth sores and sore throat.   Eyes:  Positive for eye problems (occasional blurry  vision).  Respiratory:  Negative for cough, hemoptysis and shortness of breath.   Cardiovascular:  Negative for chest pain, leg swelling and palpitations.  Gastrointestinal:  Positive for constipation. Negative for abdominal pain, blood in stool, diarrhea, nausea and vomiting.  Endocrine: Negative for hot flashes.  Genitourinary:  Negative for difficulty urinating, dysuria, frequency, hematuria, vaginal bleeding and vaginal discharge.   Musculoskeletal:  Positive for gait problem (unsteady gait). Negative for arthralgias, back pain and myalgias.  Skin:  Positive for wound (superficial ulcers managed by wound care). Negative for itching and rash.  Neurological:  Positive for gait problem (unsteady gait) and numbness (left fingertips). Negative for dizziness, extremity weakness, headaches, light-headedness, seizures and speech difficulty.  Hematological:  Negative for adenopathy. Does not bruise/bleed easily.  Psychiatric/Behavioral:  Negative for confusion, decreased concentration, depression and sleep disturbance. The patient is nervous/anxious.      VITALS:  Blood pressure 98/69, pulse 98, temperature 99 F (37.2 C), temperature source Oral, resp. rate 18, height 4\' 10"  (1.473 m), weight 121 lb 6.4 oz (55.1 kg), last menstrual period 08/18/2013, SpO2 99%.  Wt Readings from Last 3 Encounters:  08/02/23 121 lb 6.4 oz (55.1 kg)  07/10/23 125 lb 4.8 oz (56.8 kg)  06/24/23 126 lb (57.2 kg)    Body mass index is 25.37 kg/m.  Performance status (ECOG): 2 - Symptomatic, <50% confined to bed  PHYSICAL EXAM:  Physical Exam Vitals and nursing note reviewed.  Constitutional:      General: She is not in acute distress.    Appearance: Normal appearance.  HENT:     Head: Normocephalic and atraumatic.     Mouth/Throat:     Mouth: Mucous membranes are moist.     Pharynx: Oropharynx is clear. No oropharyngeal exudate or posterior oropharyngeal erythema.  Eyes:     General: No scleral icterus.     Extraocular Movements: Extraocular movements intact.     Conjunctiva/sclera: Conjunctivae normal.     Pupils: Pupils are equal, round, and reactive to light.  Cardiovascular:     Rate and Rhythm: Normal rate and regular rhythm.     Heart sounds: Normal heart sounds. No murmur heard.    No friction rub. No gallop.  Pulmonary:     Effort: Pulmonary effort is normal.     Breath sounds: Normal breath sounds. No wheezing, rhonchi or rales.  Chest:     Comments: Breast exam deferred Abdominal:     General: There is no distension.     Palpations: Abdomen is soft. There is no hepatomegaly, splenomegaly or mass.     Tenderness: There is no abdominal tenderness.  Musculoskeletal:        General: Normal range of motion.     Cervical back: Normal range of motion and neck supple. No tenderness.     Right lower leg: No edema.  Left lower leg: No edema.  Lymphadenopathy:     Cervical: No cervical adenopathy.     Upper Body:     Right upper body: No supraclavicular or axillary adenopathy.     Left upper body: No supraclavicular or axillary adenopathy.     Lower Body: No right inguinal adenopathy. No left inguinal adenopathy.  Skin:    General: Skin is warm and dry.     Coloration: Skin is not jaundiced.     Findings: No rash.  Neurological:     Mental Status: She is alert and oriented to person, place, and time.     Cranial Nerves: Cranial nerves 2-12 are intact.     Sensory: Sensation is intact.     Motor: Motor function is intact.     Coordination: Coordination is intact.     Gait: Gait is intact.     Comments: Left arm/hand tremor  Psychiatric:        Mood and Affect: Mood normal.        Behavior: Behavior normal.        Thought Content: Thought content normal.     LABS:      Latest Ref Rng & Units 08/02/2023    9:12 AM 07/10/2023    8:48 AM 06/21/2023   10:19 AM  CBC  WBC 4.0 - 10.5 K/uL 5.6  4.6  5.4   Hemoglobin 12.0 - 15.0 g/dL 56.2  9.7  13.0   Hematocrit 36.0 -  46.0 % 33.2  30.9  33.3   Platelets 150 - 400 K/uL 373  360  343       Latest Ref Rng & Units 08/02/2023    9:12 AM 07/10/2023    8:48 AM 06/21/2023   10:19 AM  CMP  Glucose 70 - 99 mg/dL 865  784  93   BUN 6 - 20 mg/dL 20  9  7    Creatinine 0.44 - 1.00 mg/dL 6.96  2.95  2.84   Sodium 135 - 145 mmol/L 139  143  144   Potassium 3.5 - 5.1 mmol/L 4.0  3.3  2.9   Chloride 98 - 111 mmol/L 100  104  104   CO2 22 - 32 mmol/L 28  29  30    Calcium 8.9 - 10.3 mg/dL 9.3  9.2  9.2   Total Protein 6.5 - 8.1 g/dL 5.9  5.5  5.7   Total Bilirubin <1.2 mg/dL 0.3  0.3  0.2   Alkaline Phos 38 - 126 U/L 83  77  71   AST 15 - 41 U/L 14  15  21    ALT 0 - 44 U/L 8  13  17       Lab Results  Component Value Date   CEA1 2.2 07/20/2021   /  CEA  Date Value Ref Range Status  07/20/2021 2.2 0.0 - 4.7 ng/mL Final    Comment:    (NOTE)                             Nonsmokers          <3.9                             Smokers             <5.6 [employer] Electrochemiluminescence Immunoassay (ECLIA) Values obtained with different assay methods or kits cannot be used  interchangeably.  Results cannot be interpreted as absolute evidence of the presence or absence of malignant disease. Performed At: [facility] [address] [place], [place] [id number] Jolene Schimke MD GE:[id number]     Lab Results  Component Value Date   TIBC 225 (L) 07/10/2023   TIBC 256 01/14/2023   TIBC 287 04/24/2022   FERRITIN 305 07/10/2023   FERRITIN 333 (H) 01/14/2023   FERRITIN 119 04/24/2022   IRONPCTSAT 11 07/10/2023   IRONPCTSAT 16 01/14/2023   IRONPCTSAT 9 (L) 04/24/2022   Lab Results  Component Value Date   LDH 171 07/10/2023    STUDIES:  No results found.    HISTORY:   Past Medical History:  Diagnosis Date   Achilles tendinitis of left lower extremity 04/22/2018   Acute peptic ulcer without hemorrhage and without perforation 11/29/2020   Acute sinusitis 11/29/2020   Anemia in  neoplastic disease 03/26/2023   Bipolar disorder (HCC) 11/29/2020   Breast cancer (HCC)    Cancer (HCC)    Cardiac murmur    Chest pain    Chronic fatigue syndrome 11/29/2020   Depression    Essential hypertension 11/29/2020   Hepatic steatosis determined by biopsy of liver 07/04/2020   HLD (hyperlipidemia)    Hypertension    Hypomagnesemia 03/26/2023   Hypothyroidism 11/29/2020   Insomnia 11/29/2020   Iron deficiency anemia 06/05/2022   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   Migraine 11/29/2020   Mild intermittent asthma 11/29/2020   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   Mixed hyperlipidemia 11/29/2020   Obesity due to excess calories    Osteopenia after menopause 07/04/2020   Other long term (current) drug therapy 11/29/2020   Other vitamin B12 deficiency anemias 11/29/2020   Personal history of malignant neoplasm of breast 11/29/2020   Pre-diabetes    Renal cyst, acquired, right 07/04/2020   14.5 cm in October 2021   Retrocalcaneal bursitis (back of heel), left 04/22/2018   Seasonal allergic rhinitis 11/29/2020   Tightness of heel cord, left 04/22/2018   Tremor 08/02/2023   Type 2 diabetes mellitus without complications (HCC) 11/29/2020   Vitamin D deficiency 11/29/2020    Past Surgical History:  Procedure Laterality Date   CESAREAN SECTION     IR BONE TUMOR(S)RF ABLATION  05/29/2022   IR BONE TUMOR(S)RF ABLATION  05/29/2022   IR KYPHO EA ADDL LEVEL THORACIC OR LUMBAR  05/29/2022   IR KYPHO THORACIC WITH BONE BIOPSY  05/29/2022   IR RADIOLOGIST EVAL & MGMT  05/22/2022   IR RADIOLOGIST EVAL & MGMT  06/12/2022   LIVER BIOPSY  01/2019   lumpectomy  2014   Left Breast   PORT-A-CATH REMOVAL     PORTACATH PLACEMENT  08/28/2013   STOMACH SURGERY     Tummy Tuck   TOTAL HIP ARTHROPLASTY Right 07/04/2021   Procedure: TOTAL HIP ARTHROPLASTY;  Surgeon: Durene Romans, MD;  Location: [facility];  Service: Orthopedics;   Laterality: Right;  90   WISDOM TOOTH EXTRACTION      Family History  Problem Relation Age of Onset   Hyperlipidemia Mother    Diabetes Father    Stroke Father    Colon cancer Neg Hx    Colon polyps Neg Hx    Esophageal cancer Neg Hx    Rectal cancer Neg Hx    Stomach cancer Neg Hx     Social History:  reports that she quit smoking about 20 years ago. Her smoking use included  cigarettes. She has never used smokeless tobacco. She reports current alcohol use. She reports that she does not use drugs.The patient is accompanied by her husband today.  Allergies:  Allergies  Allergen Reactions   Reglan [Metoclopramide]     Made patient feel really funny when taking this medication     Current Medications: Current Outpatient Medications  Medication Sig Dispense Refill   albuterol (VENTOLIN HFA) 108 (90 Base) MCG/ACT inhaler Inhale 2 puffs into the lungs every 6 (six) hours as needed for shortness of breath.     ARIPiprazole (ABILIFY) 5 MG tablet Take 2.5 mg by mouth daily.     aspirin 81 MG EC tablet Take 81 mg by mouth daily. Swallow whole.     atorvastatin (LIPITOR) 20 MG tablet TAKE ONE TABLET BY MOUTH EVERY DAY 90 tablet 1   augmented betamethasone dipropionate (DIPROLENE-AF) 0.05 % cream      dapagliflozin propanediol (FARXIGA) 10 MG TABS tablet TAKE ONE TABLET BY MOUTH EVERY DAY 30 tablet 10   desvenlafaxine (PRISTIQ) 50 MG 24 hr tablet Take 50 mg by mouth daily.     dexamethasone (DECADRON) 4 MG tablet Take 1 tablet (4 mg total) by mouth 2 (two) times daily. For 5 days after chemotherapy 20 tablet 2   diphenoxylate-atropine (LOMOTIL) 2.5-0.025 MG tablet TAKE TWO TABLETS BY MOUTH FOUR TIMES A DAY AS NEEDED FOR DIARRHEA OR LOOSE STOOLS 100 tablet 1   labetalol (NORMODYNE) 100 MG tablet Take 1 tablet (100 mg total) by mouth 2 (two) times daily. 180 tablet 3   loratadine (CLARITIN) 10 MG tablet TAKE ONE TABLET BY MOUTH DAILY 30 tablet 2   LORazepam (ATIVAN) 1 MG tablet Take 1  tablet (1 mg total) by mouth every 8 (eight) hours. 30 tablet 1   magic mouthwash (lidocaine, diphenhydrAMINE, alum & mag hydroxide) suspension Take 5 mLs by mouth every 3 (three) hours as needed. Swish and spit; for throat/mouth pain     naloxone (NARCAN) nasal spray 4 mg/0.1 mL One spray in nostril as needed, may repeat every 2 to 3 minutes until medical assistance becomes available 4 each 1   nitroGLYCERIN (NITROSTAT) 0.4 MG SL tablet Place 0.4 mg under the tongue every 5 (five) minutes as needed for chest pain.     nystatin (MYCOSTATIN/NYSTOP) powder Apply 1 Application topically 2 (two) times daily.     omeprazole (PRILOSEC) 40 MG capsule Take 1 capsule (40 mg total) by mouth at bedtime. 30 capsule 5   ondansetron (ZOFRAN) 8 MG tablet Take 1 tablet (8 mg total) by mouth every 8 (eight) hours as needed for nausea or vomiting. 60 tablet 5   Oxycodone HCl 10 MG TABS TAKE ONE TABLET BY MOUTH EVERY 4 HOURS AS NEEDED FOR PAIN 180 tablet 0   Potassium Chloride 10 % SOLN Take 20 mEq by mouth 2 (two) times daily. (Patient not taking: Reported on 08/02/2023) 900 mL 2   potassium chloride SA (KLOR-CON M) 20 MEQ tablet Take 1 tablet (20 mEq total) by mouth 2 (two) times daily. (Patient taking differently: Take 20 mEq by mouth 2 (two) times daily. Taking potasium citrate 1000 mg per two gummies) 60 tablet 1   prochlorperazine (COMPAZINE) 10 MG tablet TAKE ONE TABLET BY MOUTH EVERY 6 HOURS AS NEEDED FOR NAUSEA/VOMITING 90 tablet 3   Current Facility-Administered Medications  Medication Dose Route Frequency Provider Last Rate Last Admin   technetium sestamibi generic (CARDIOLITE) injection 10.5 millicurie  10.5 millicurie Intravenous Once PRN Revankar, Aundra Dubin, MD  Facility-Administered Medications Ordered in Other Visits  Medication Dose Route Frequency Provider Last Rate Last Admin   acetaminophen (TYLENOL) tablet 650 mg  650 mg Oral Once Dellia Beckwith, MD       diphenhydrAMINE (BENADRYL)  capsule 50 mg  50 mg Oral Once Dellia Beckwith, MD       sodium chloride flush (NS) 0.9 % injection 10 mL  10 mL Intracatheter PRN Dellia Beckwith, MD   10 mL at 08/02/23 1228

## 2023-08-03 ENCOUNTER — Other Ambulatory Visit: Payer: Self-pay | Admitting: Oncology

## 2023-08-03 DIAGNOSIS — C7951 Secondary malignant neoplasm of bone: Secondary | ICD-10-CM

## 2023-08-12 ENCOUNTER — Encounter: Payer: Self-pay | Admitting: Oncology

## 2023-08-16 ENCOUNTER — Other Ambulatory Visit: Payer: Self-pay | Admitting: Oncology

## 2023-08-16 ENCOUNTER — Encounter: Payer: Self-pay | Admitting: Oncology

## 2023-08-16 DIAGNOSIS — C50412 Malignant neoplasm of upper-outer quadrant of left female breast: Secondary | ICD-10-CM

## 2023-08-16 DIAGNOSIS — C7951 Secondary malignant neoplasm of bone: Secondary | ICD-10-CM

## 2023-08-22 NOTE — Assessment & Plan Note (Addendum)
 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 Ki67 was 60%. PET imaging in December revealed dominant finding of intensely hypermetabolic aggressive skeletal metastasis involving the calvarium, sternum, thoracic and lumbar spine, and pelvis, with potential pathologic fracture of the right femur with internal fixation. There was soft tissue extension into the anterior mediastinum associated with the manubrial metastasis. Multiple spinal vertebral body lesions had cortical destruction along the posterior margin. She started monthly Xgeva  in December, and completed radiation therapy. She was receiving THP, but cycle 4 was delayed and dose reduction made by 20% due to left leg skin infection.    She continued with Herceptin /Perjeta  until she had progression of disease. In January 2024, she developed increased pain of the left hip. She was found to have innumerable metastatic lesions throughout the pelvis and proximal femur. There is a non displaced pathologic fracture through a large lesion in the superior left acetabulum. She was referred to an orthopedic oncologist at [facility] and they recommended radiation and no surgery. She received her palliative radiation. She followed-up with [facility] in April and they do not recommend surgery, but will continue surveillance.    We then switched her to Enhertu  chemotherapy, but she has had significant toxicities of nausea, vomiting, diarrhea, and hypokalemia.  She was also placed on hormonal therapy with Faslodex  injections.  Repeat CT imaging in July revealed decreased soft tissue thickening around the left subclavian structures and thoracic inlet.  Left lateral breast mass was stable.  Widespread mixed lytic and sclerotic  osseous metastatic lesions were stable.  There was unchanged severe elevation of the left hemidiaphragm.  She continues Xgeva  for her bone metastases, which is being given every 6 weeks to coincide with chemotherapy.   CT neck in October to evaluate for possible soft tissue mass and painful swallowing did not reveal any soft tissue masses.  The extensive mixed lytic and sclerotic skeletal metastases were much more pronounced in the visible upper thorax than in the cervical spine and were felt to be grossly stable from July 2024. CT chest, abdomen and pelvis in November revealed stable bony disease without evidence of new/progressive disease.   Her pain remains fairly well-controlled on the current regimen.  She will proceed with 16th cycle of Enhertu  today.  She will continue denosumab  every 6 weeks.  She has new fullness in the right abdomen this may be palpable stool or possibly hepatomegaly. She knows to take medicine for constipation.  She will contact us  if she has new abdominal symptoms.  I gave her a prescription for doxycycline  in case she develops additional symptoms of infection.  I will plan to reassess her disease baseline prior to her next appointment.  We will plan to see her back in 3 weeks with a CBC, comprehensive metabolic panel and CT chest, abdomen and pelvis prior to a 17th cycle of Enhertu .

## 2023-08-22 NOTE — Progress Notes (Signed)
 [facility]  [address] [employer],  [place]  [address]279 [phone]  Clinic Day:  08/23/2023  Referring physician: Dawayne Kerney SQUIBB, MD  ASSESSMENT & PLAN:   Assessment & Plan: Malignant neoplasm metastatic to bone [facility]) Bone metastases diagnosed in November 2022, with multiple marrow replacing lesions within the proximal right femur within the ischium as well as the inferior rami. 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 Ki67 was 60%. PET imaging in December revealed dominant finding of intensely hypermetabolic aggressive skeletal metastasis involving the calvarium, sternum, thoracic and lumbar spine, and pelvis, with potential pathologic fracture of the right femur with internal fixation. There was soft tissue extension into the anterior mediastinum associated with the manubrial metastasis. Multiple spinal vertebral body lesions had cortical destruction along the posterior margin. She started monthly Xgeva  in December, and completed radiation therapy. She was receiving THP, but cycle 4 was delayed and dose reduction made by 20% due to left leg skin infection.    She continued with Herceptin /Perjeta  until she had progression of disease. In January 2024, she developed increased pain of the left hip. She was found to have innumerable metastatic lesions throughout the pelvis and proximal femur. There is a non displaced pathologic fracture through a large lesion in the superior left acetabulum. She was referred to an orthopedic oncologist at [facility] and they recommended radiation and no surgery. She received her palliative radiation. She followed-up with [facility] in April and they do not recommend surgery, but will continue surveillance.    We then switched her to Enhertu  chemotherapy, but she has had significant toxicities of nausea, vomiting, diarrhea, and hypokalemia.  She was also  placed on hormonal therapy with Faslodex  injections.  Repeat CT imaging in July revealed decreased soft tissue thickening around the left subclavian structures and thoracic inlet.  Left lateral breast mass was stable.  Widespread mixed lytic and sclerotic osseous metastatic lesions were stable.  There was unchanged severe elevation of the left hemidiaphragm.  She continues Xgeva  for her bone metastases, which is being given every 6 weeks to coincide with chemotherapy.   CT neck in October to evaluate for possible soft tissue mass and painful swallowing did not reveal any soft tissue masses.  The extensive mixed lytic and sclerotic skeletal metastases were much more pronounced in the visible upper thorax than in the cervical spine and were felt to be grossly stable from July 2024. CT chest, abdomen and pelvis in November revealed stable bony disease without evidence of new/progressive disease.   Her pain remains fairly well-controlled on the current regimen.  She will proceed with 16th cycle of Enhertu  today.  She will continue denosumab  every 6 weeks.  She has new fullness in the right abdomen this may be due to to constipation, or possibly represent hepatomegaly or palpable right renal cyst.  I will plan to reassess her disease baseline prior to her next appointment.  We will plan to see her back in 3 weeks with a CBC, comprehensive metabolic panel and CT chest, abdomen and pelvis prior to a 17th cycle of Enhertu .  Tremor New right hand/arm tremor of uncertain etiology, with occasional blurry vision. The tremor is less pronounced. She had an MRI brain yesterday and the report is pending. There is no glaringly obvious abnormality seen by myself or Dr. Cornelius on review of the films.  We will contact her when we have  the final report.  Skin ulcer (HCC) Skin ulcers of the bilateral lower extremities being managed at the wound center. The patient states these are slowly improving.   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 She was found to be iron  deficient in September 2023 and was given IV iron  supplement. Repeat evaluation in November did not reveal recurrent iron  deficiency. B12 was low normal, so I started her on B12 500 mcg daily. Folate was normal.  Hypokalemia Persistent hypokalemia.  She states she is unable to tolerate liquid potassium due to the taste nor potassium tablets due to difficulty swallowing them. She is using OTC potassium gummies, 500 mg, 2 twice daily. Her potassium is normal today, so she will continue her oral supplement.    The patient understands the plans discussed today and is in agreement with them.  She knows to contact our office if she develops concerns prior to her next appointment.   40 minutes was spent in patient care.  This included time spent preparing to see the patient (e.g., review of tests), obtaining and/or reviewing separately obtained history, counseling and educating the patient/family/caregiver, ordering medications, tests, or procedures; documenting clinical information in the electronic or other health record, independently interpreting results and communicating results to the patient/family/caregiver as well as coordination of care.     Andrez DELENA Foy, PA-C  [facility] [place] [facility] DEPT OF [place] [address] [place] [address] Dept: [phone] Dept Fax: [phone]   Orders Placed This Encounter  Procedures   CT CHEST ABDOMEN PELVIS W CONTRAST    Standing Status:   Future    Expected Date:   09/11/2023    Expiration Date:   08/22/2024    If indicated for the ordered procedure, I authorize the administration of contrast media per Radiology protocol:   Yes    Does the patient have a contrast media/X-ray dye allergy?:   No    Is patient pregnant?:   No    Preferred imaging location?:   [facility]    If  indicated for the ordered procedure, I authorize the administration of oral contrast media per Radiology protocol:   Yes      CHIEF COMPLAINT:  CC: Metastatic hormone receptor positive breast cancer  Current Treatment: Enhertu  every 3 weeks/denosumab  every 6 weeks  HISTORY OF PRESENT ILLNESS:   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 (HCC)   07/23/2013 Cancer Staging   Staging form: Breast, AJCC 7th Edition - Clinical stage from 07/23/2013: Stage IIB (T2, N1, M0) - Signed by Cornelius Wanda DEL, MD on 07/04/2020 Prognostic indicators: Pos LVI   09/22/2021 - 04/06/2022 Chemotherapy   Patient is on Treatment Plan : BREAST  Trastuzumab  + Pertuzumab  q21d      09/22/2021 - 09/21/2022 Chemotherapy   Patient is on Treatment Plan : BREAST Trastuzumab  + Pertuzumab  q21d     10/12/2022 -  Chemotherapy   Patient is on Treatment Plan : BREAST METASTATIC Fam-Trastuzumab Deruxtecan-nxki  (Enhertu ) (5.4) q21d     Malignant neoplasm metastatic to bone (HCC)  06/28/2021 Initial Diagnosis   Bone metastases (HCC)   09/22/2021 - 04/06/2022 Chemotherapy   Patient is on Treatment Plan : BREAST  Trastuzumab  + Pertuzumab  q21d      09/22/2021 - 09/21/2022 Chemotherapy  Patient is on Treatment Plan : BREAST Trastuzumab  + Pertuzumab  q21d     05/11/2022 Imaging   CT chest, abdomen and pelvis:  IMPRESSION:  1. Widespread bony metastatic disease shows increased sclerosis  since previous imaging. This is likely related to response to  therapy in the interval. There is a single lesion along the LEFT  iliac which shows continued lytic change and warrants attention on  follow-up  2. Interval development of pathologic fractures at in the upper  thoracic spine and at the thoracolumbar junction with associated  kyphosis.  3. No signs of solid organ or nodal metastatic  disease.  4. Marked elevation of the LEFT hemidiaphragm as on the prior PET.  5. Aortic atherosclerosis.    10/12/2022 -  Chemotherapy   Patient is on Treatment Plan : BREAST METASTATIC Fam-Trastuzumab Deruxtecan-nxki  (Enhertu ) (5.4) q21d         INTERVAL HISTORY:  Anna [name] is here today for repeat clinical assessment. She reports constipation. She denies vomiting or abdominal pain. She has persistent mild tremor and occasional blurry vision.  She denies diplopia, denies headaches, paresthesias or focal weakness.  She has had chronic difficulty swallowing, which is stable.  She reports stable neuropathy in the hands and feet.  She has chronic shortness of breath with exertion, which is stable. She has had a sore throat for about a week.  She denies cough.  She denies fevers or chills. She states her back and neck pain is controlled with oxycodone  immediate release. She did not tolerate long acting narcotics.. Her appetite is fairly good. Her weight has increased 4 pounds over last 3 weeks .  MRI brain done on January 9, report is pending.  REVIEW OF SYSTEMS:  Review of Systems  Constitutional:  Negative for appetite change, chills, fatigue, fever and unexpected weight change.  HENT:   Positive for sore throat and trouble swallowing. Negative for lump/mass, mouth sores and nosebleeds.   Eyes:  Positive for eye problems (blurry vision).  Respiratory:  Negative for cough, hemoptysis and shortness of breath.   Cardiovascular:  Negative for chest pain and leg swelling.  Gastrointestinal:  Negative for abdominal pain, blood in stool, constipation, diarrhea, nausea and vomiting.  Endocrine: Negative for hot flashes.  Genitourinary:  Negative for difficulty urinating, dysuria, frequency and hematuria.   Musculoskeletal:  Positive for back pain and neck pain. Negative for arthralgias, gait problem and myalgias.  Skin:  Positive for wound. Negative for itching and rash.  Neurological:  Positive for  numbness (neuropathy bilateral hands and feet). Negative for dizziness, extremity weakness, gait problem, headaches and light-headedness.  Hematological:  Negative for adenopathy. Does not bruise/bleed easily.  Psychiatric/Behavioral:  Negative for confusion, depression and sleep disturbance. The patient is not nervous/anxious.      VITALS:  Blood pressure 98/72, pulse 95, temperature 99.1 F (37.3 C), temperature source Oral, resp. rate 18, height 4' 10 (1.473 m), weight 125 lb 1.6 oz (56.7 kg), last menstrual period 08/18/2013, SpO2 98%.  Wt Readings from Last 3 Encounters:  08/23/23 125 lb 1.6 oz (56.7 kg)  08/02/23 121 lb 6.4 oz (55.1 kg)  07/10/23 125 lb 4.8 oz (56.8 kg)    Body mass index is 26.15 kg/m.  Performance status (ECOG): 2 - Symptomatic, <50% confined to bed  PHYSICAL EXAM:  Physical Exam Vitals and nursing note reviewed.  Constitutional:      General: She is not in acute distress.    Appearance: She is normal weight. She is ill-appearing (chronically  ill-appearing). She is not toxic-appearing or diaphoretic.  HENT:     Head: Normocephalic and atraumatic.     Comments: Cannot completely visualize the posterior pharynx due to inability to extend the neck    Mouth/Throat:     Mouth: Mucous membranes are moist.     Pharynx: Oropharynx is clear.  Eyes:     General: No scleral icterus.    Extraocular Movements: Extraocular movements intact.     Conjunctiva/sclera: Conjunctivae normal.     Pupils: Pupils are equal, round, and reactive to light.  Cardiovascular:     Rate and Rhythm: Normal rate and regular rhythm.     Heart sounds: Normal heart sounds. No murmur heard.    No friction rub. No gallop.  Pulmonary:     Effort: Pulmonary effort is normal.     Breath sounds: Decreased air movement present. Examination of the left-middle field reveals decreased breath sounds. Examination of the left-lower field reveals decreased breath sounds. Decreased breath sounds  (known elevation of left hemidiaphram) present. No wheezing, rhonchi or rales.  Chest:     Comments: Breast exam is deferred Abdominal:     General: There is no distension.     Palpations: There is no splenomegaly.     Tenderness: There is no abdominal tenderness.     Comments: Non specific firmness of the right abdomen,  Musculoskeletal:     Cervical back: Rigidity (chronic) present. No tenderness. No spinous process tenderness or muscular tenderness. Decreased range of motion.     Right lower leg: No edema.     Left lower leg: No edema.  Lymphadenopathy:     Cervical: No cervical adenopathy.     Upper Body:     Right upper body: No supraclavicular or axillary adenopathy.     Left upper body: No supraclavicular or axillary adenopathy.  Skin:    General: Skin is warm and dry.     Coloration: Skin is not jaundiced.     Findings: No rash.  Neurological:     Mental Status: She is alert and oriented to person, place, and time.     Cranial Nerves: No cranial nerve deficit.  Psychiatric:        Mood and Affect: Mood normal.        Behavior: Behavior normal.        Thought Content: Thought content normal.    LABS:      Latest Ref Rng & Units 08/23/2023    9:13 AM 08/02/2023    9:12 AM 07/10/2023    8:48 AM  CBC  WBC 4.0 - 10.5 K/uL 4.9  5.6  4.6   Hemoglobin 12.0 - 15.0 g/dL 89.6  89.5  9.7   Hematocrit 36.0 - 46.0 % 32.3  33.2  30.9   Platelets 150 - 400 K/uL 357  373  360       Latest Ref Rng & Units 08/23/2023    9:13 AM 08/02/2023    9:12 AM 07/10/2023    8:48 AM  CMP  Glucose 70 - 99 mg/dL 866  837  872   BUN 6 - 20 mg/dL 19  20  9    Creatinine 0.44 - 1.00 mg/dL 9.47  9.40  9.29   Sodium 135 - 145 mmol/L 141  139  143   Potassium 3.5 - 5.1 mmol/L 4.0  4.0  3.3   Chloride 98 - 111 mmol/L 102  100  104   CO2 22 - 32 mmol/L 30  28  29   Calcium  8.9 - 10.3 mg/dL 9.5  9.3  9.2   Total Protein 6.5 - 8.1 g/dL 5.9  5.9  5.5   Total Bilirubin 0.0 - 1.2 mg/dL <9.7  0.3   0.3   Alkaline Phos 38 - 126 U/L 101  83  77   AST 15 - 41 U/L 21  14  15    ALT 0 - 44 U/L 25  8  13       Lab Results  Component Value Date   CEA1 2.2 07/20/2021   /  CEA  Date Value Ref Range Status  07/20/2021 2.2 0.0 - 4.7 ng/mL Final    Comment:    (NOTE)                             Nonsmokers          <3.9                             Smokers             <5.6 [employer] Electrochemiluminescence Immunoassay (ECLIA) Values obtained with different assay methods or kits cannot be used interchangeably.  Results cannot be interpreted as absolute evidence of the presence or absence of malignant disease. Performed At: [facility] [address] [place], [place] [id number] Jennette Shorter MD Ey:[phone]    Lab Results  Component Value Date   TIBC 225 (L) 07/10/2023   TIBC 256 01/14/2023   TIBC 287 04/24/2022   FERRITIN 305 07/10/2023   FERRITIN 333 (H) 01/14/2023   FERRITIN 119 04/24/2022   IRONPCTSAT 11 07/10/2023   IRONPCTSAT 16 01/14/2023   IRONPCTSAT 9 (L) 04/24/2022   Lab Results  Component Value Date   LDH 171 07/10/2023    STUDIES:  No results found.    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   Bipolar disorder (HCC) 11/29/2020   Breast cancer (HCC)    Cancer (HCC)    Cardiac murmur    Chest pain    Chronic fatigue syndrome 11/29/2020   Depression    Essential hypertension 11/29/2020   Hepatic steatosis determined by biopsy of liver 07/04/2020   HLD (hyperlipidemia)    Hypertension    Hypomagnesemia 03/26/2023   Hypothyroidism 11/29/2020   Insomnia 11/29/2020   Iron  deficiency anemia 06/05/2022   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   Migraine 11/29/2020   Mild  intermittent asthma 11/29/2020   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   Mixed hyperlipidemia 11/29/2020   Obesity due to excess calories    Osteopenia after menopause 07/04/2020   Other long term (current) drug therapy 11/29/2020   Other vitamin B12 deficiency anemias 11/29/2020   Personal history of malignant neoplasm of breast 11/29/2020   Pre-diabetes    Renal cyst, acquired, right 07/04/2020   14.5 cm in October 2021   Retrocalcaneal bursitis (back of heel), left 04/22/2018   Seasonal allergic rhinitis 11/29/2020   Tightness of heel cord, left 04/22/2018   Tremor 08/02/2023   Type 2 diabetes mellitus without complications (HCC) 11/29/2020   Vitamin D  deficiency 11/29/2020    Past Surgical History:  Procedure Laterality Date  CESAREAN SECTION     IR BONE TUMOR(S)RF ABLATION  05/29/2022   IR BONE TUMOR(S)RF ABLATION  05/29/2022   IR KYPHO EA ADDL LEVEL THORACIC OR LUMBAR  05/29/2022   IR KYPHO THORACIC WITH BONE BIOPSY  05/29/2022   IR RADIOLOGIST EVAL & MGMT  05/22/2022   IR RADIOLOGIST EVAL & MGMT  06/12/2022   LIVER BIOPSY  01/2019   lumpectomy  2014   Left Breast   PORT-A-CATH REMOVAL     PORTACATH PLACEMENT  08/28/2013   STOMACH SURGERY     Tummy Tuck   TOTAL HIP ARTHROPLASTY Right 07/04/2021   Procedure: TOTAL HIP ARTHROPLASTY;  Surgeon: Ernie Cough, MD;  Location: [facility];  Service: Orthopedics;  Laterality: Right;  90   WISDOM TOOTH EXTRACTION      Family History  Problem Relation Age of Onset   Hyperlipidemia Mother    Diabetes Father    Stroke Father    Colon cancer Neg Hx    Colon polyps Neg Hx    Esophageal cancer Neg Hx    Rectal cancer Neg Hx    Stomach cancer Neg Hx     Social History:  reports that she quit smoking about 21 years ago. Her smoking use included cigarettes. She has never used smokeless tobacco. She reports current alcohol use. She reports that she does not use drugs.The patient is accompanied by her husband  today.  Allergies:  Allergies  Allergen Reactions   Reglan  [Metoclopramide ]     Made patient feel really funny when taking this medication     Current Medications: Current Outpatient Medications  Medication Sig Dispense Refill   doxycycline  (VIBRAMYCIN ) 100 MG capsule Take 1 capsule (100 mg total) by mouth 2 (two) times daily. 20 capsule 0   albuterol  (VENTOLIN  HFA) 108 (90 Base) MCG/ACT inhaler Inhale 2 puffs into the lungs every 6 (six) hours as needed for shortness of breath.     ARIPiprazole  (ABILIFY ) 5 MG tablet Take 2.5 mg by mouth daily.     aspirin 81 MG EC tablet Take 81 mg by mouth daily. Swallow whole.     atorvastatin  (LIPITOR) 20 MG tablet TAKE ONE TABLET BY MOUTH EVERY DAY 90 tablet 1   augmented betamethasone dipropionate (DIPROLENE-AF) 0.05 % cream      dapagliflozin  propanediol (FARXIGA ) 10 MG TABS tablet TAKE ONE TABLET BY MOUTH EVERY DAY 30 tablet 10   desvenlafaxine  (PRISTIQ ) 50 MG 24 hr tablet Take 50 mg by mouth daily.     dexamethasone  (DECADRON ) 4 MG tablet Take 1 tablet (4 mg total) by mouth 2 (two) times daily. For 5 days after chemotherapy 20 tablet 2   diphenoxylate -atropine  (LOMOTIL ) 2.5-0.025 MG tablet TAKE TWO TABLETS BY MOUTH FOUR TIMES A DAY AS NEEDED FOR DIARRHEA OR LOOSE STOOLS 100 tablet 1   labetalol  (NORMODYNE ) 100 MG tablet Take 1 tablet (100 mg total) by mouth 2 (two) times daily. 180 tablet 3   loratadine  (CLARITIN ) 10 MG tablet TAKE ONE TABLET BY MOUTH DAILY 30 tablet 2   LORazepam  (ATIVAN ) 1 MG tablet Take 1 tablet (1 mg total) by mouth every 8 (eight) hours. 30 tablet 1   magic mouthwash (lidocaine , diphenhydrAMINE , alum & mag hydroxide) suspension Take 5 mLs by mouth every 3 (three) hours as needed. Swish and spit; for throat/mouth pain     naloxone  (NARCAN ) nasal spray 4 mg/0.1 mL One spray in nostril as needed, may repeat every 2 to 3 minutes until medical assistance becomes available 4 each 1  nitroGLYCERIN  (NITROSTAT ) 0.4 MG SL tablet  Place 0.4 mg under the tongue every 5 (five) minutes as needed for chest pain.     nystatin  (MYCOSTATIN /NYSTOP ) powder Apply 1 Application topically 2 (two) times daily.     omeprazole  (PRILOSEC) 40 MG capsule Take 1 capsule (40 mg total) by mouth at bedtime. 30 capsule 5   ondansetron  (ZOFRAN ) 8 MG tablet Take 1 tablet (8 mg total) by mouth every 8 (eight) hours as needed for nausea or vomiting. 60 tablet 5   Oxycodone  HCl 10 MG TABS TAKE ONE TABLET BY MOUTH EVERY 4 HOURS AS NEEDED FOR PAIN 180 tablet 0   potassium chloride  SA (KLOR-CON  M) 20 MEQ tablet Take 1 tablet (20 mEq total) by mouth 2 (two) times daily. (Patient taking differently: Take 20 mEq by mouth 2 (two) times daily. Taking potasium citrate 1000 mg per two gummies) 60 tablet 1   prochlorperazine  (COMPAZINE ) 10 MG tablet TAKE ONE TABLET BY MOUTH EVERY 6 HOURS AS NEEDED FOR NAUSEA/VOMITING 90 tablet 3   Current Facility-Administered Medications  Medication Dose Route Frequency Provider Last Rate Last Admin   technetium sestamibi generic (CARDIOLITE ) injection 10.5 millicurie  10.5 millicurie Intravenous Once PRN Revankar, Rajan R, MD       Facility-Administered Medications Ordered in Other Visits  Medication Dose Route Frequency Provider Last Rate Last Admin   acetaminophen  (TYLENOL ) tablet 650 mg  650 mg Oral Once Cornelius Wanda DEL, MD       diphenhydrAMINE  (BENADRYL ) capsule 50 mg  50 mg Oral Once Cornelius Wanda DEL, MD       sodium chloride  flush (NS) 0.9 % injection 10 mL  10 mL Intracatheter PRN Cornelius Wanda DEL, MD   10 mL at 08/23/23 1233

## 2023-08-23 ENCOUNTER — Inpatient Hospital Stay: Payer: 59

## 2023-08-23 ENCOUNTER — Encounter: Payer: Self-pay | Admitting: Hematology and Oncology

## 2023-08-23 ENCOUNTER — Telehealth: Payer: Self-pay

## 2023-08-23 ENCOUNTER — Inpatient Hospital Stay: Payer: 59 | Attending: Hematology and Oncology | Admitting: Hematology and Oncology

## 2023-08-23 VITALS — BP 98/72 | HR 95 | Temp 99.1°F | Resp 18 | Ht <= 58 in | Wt 125.1 lb

## 2023-08-23 DIAGNOSIS — L98491 Non-pressure chronic ulcer of skin of other sites limited to breakdown of skin: Secondary | ICD-10-CM | POA: Diagnosis not present

## 2023-08-23 DIAGNOSIS — E876 Hypokalemia: Secondary | ICD-10-CM | POA: Insufficient documentation

## 2023-08-23 DIAGNOSIS — K760 Fatty (change of) liver, not elsewhere classified: Secondary | ICD-10-CM

## 2023-08-23 DIAGNOSIS — D630 Anemia in neoplastic disease: Secondary | ICD-10-CM | POA: Insufficient documentation

## 2023-08-23 DIAGNOSIS — L97929 Non-pressure chronic ulcer of unspecified part of left lower leg with unspecified severity: Secondary | ICD-10-CM | POA: Diagnosis not present

## 2023-08-23 DIAGNOSIS — L97919 Non-pressure chronic ulcer of unspecified part of right lower leg with unspecified severity: Secondary | ICD-10-CM | POA: Diagnosis not present

## 2023-08-23 DIAGNOSIS — Z5111 Encounter for antineoplastic chemotherapy: Secondary | ICD-10-CM | POA: Diagnosis present

## 2023-08-23 DIAGNOSIS — Z170 Estrogen receptor positive status [ER+]: Secondary | ICD-10-CM | POA: Diagnosis not present

## 2023-08-23 DIAGNOSIS — C50412 Malignant neoplasm of upper-outer quadrant of left female breast: Secondary | ICD-10-CM | POA: Insufficient documentation

## 2023-08-23 DIAGNOSIS — R251 Tremor, unspecified: Secondary | ICD-10-CM | POA: Diagnosis not present

## 2023-08-23 DIAGNOSIS — Z923 Personal history of irradiation: Secondary | ICD-10-CM | POA: Insufficient documentation

## 2023-08-23 DIAGNOSIS — Z87891 Personal history of nicotine dependence: Secondary | ICD-10-CM | POA: Insufficient documentation

## 2023-08-23 DIAGNOSIS — C7951 Secondary malignant neoplasm of bone: Secondary | ICD-10-CM

## 2023-08-23 DIAGNOSIS — Z5112 Encounter for antineoplastic immunotherapy: Secondary | ICD-10-CM | POA: Insufficient documentation

## 2023-08-23 LAB — CMP (CANCER CENTER ONLY)
ALT: 25 U/L (ref 0–44)
AST: 21 U/L (ref 15–41)
Albumin: 3.7 g/dL (ref 3.5–5.0)
Alkaline Phosphatase: 101 U/L (ref 38–126)
Anion gap: 9 (ref 5–15)
BUN: 19 mg/dL (ref 6–20)
CO2: 30 mmol/L (ref 22–32)
Calcium: 9.5 mg/dL (ref 8.9–10.3)
Chloride: 102 mmol/L (ref 98–111)
Creatinine: 0.52 mg/dL (ref 0.44–1.00)
GFR, Estimated: >60 mL/min (ref >60)
Glucose, Bld: 133 mg/dL — ABNORMAL HIGH (ref 70–99)
Potassium: 4 mmol/L (ref 3.5–5.1)
Sodium: 141 mmol/L (ref 135–145)
Total Bilirubin: <0.2 mg/dL (ref 0.0–1.2)
Total Protein: 5.9 g/dL — ABNORMAL LOW (ref 6.5–8.1)

## 2023-08-23 LAB — CBC WITH DIFFERENTIAL (CANCER CENTER ONLY)
Abs Immature Granulocytes: 0.03 10*3/uL (ref 0.00–0.07)
Basophils Absolute: 0 10*3/uL (ref 0.0–0.1)
Basophils Relative: 1 %
Eosinophils Absolute: 0.4 10*3/uL (ref 0.0–0.5)
Eosinophils Relative: 7 %
HCT: 32.3 % — ABNORMAL LOW (ref 36.0–46.0)
Hemoglobin: 10.3 g/dL — ABNORMAL LOW (ref 12.0–15.0)
Immature Granulocytes: 1 %
Lymphocytes Relative: 10 %
Lymphs Abs: 0.5 10*3/uL — ABNORMAL LOW (ref 0.7–4.0)
MCH: 27.9 pg (ref 26.0–34.0)
MCHC: 31.9 g/dL (ref 30.0–36.0)
MCV: 87.5 fL (ref 80.0–100.0)
Monocytes Absolute: 0.7 10*3/uL (ref 0.1–1.0)
Monocytes Relative: 13 %
Neutro Abs: 3.4 10*3/uL (ref 1.7–7.7)
Neutrophils Relative %: 68 %
Platelet Count: 357 10*3/uL (ref 150–400)
RBC: 3.69 MIL/uL — ABNORMAL LOW (ref 3.87–5.11)
RDW: 18.1 % — ABNORMAL HIGH (ref 11.5–15.5)
WBC Count: 4.9 10*3/uL (ref 4.0–10.5)
nRBC: 0 % (ref 0.0–0.2)
nRBC: 0 /100{WBCs}

## 2023-08-23 MED ORDER — SODIUM CHLORIDE 0.9% FLUSH
10.0000 mL | INTRAVENOUS | Status: DC | PRN
  Administered 2023-08-23: 10 mL

## 2023-08-23 MED ORDER — HEPARIN SOD (PORK) LOCK FLUSH 100 UNIT/ML IV SOLN
500.0000 [IU] | Freq: Once | INTRAVENOUS | Status: AC | PRN
Start: 2023-08-23 — End: 2023-08-23
  Administered 2023-08-23: 500 [IU]

## 2023-08-23 MED ORDER — DEXTROSE 5 % IV SOLN: Freq: Once | INTRAVENOUS | Status: AC

## 2023-08-23 MED ORDER — ACETAMINOPHEN 325 MG PO TABS
650.0000 mg | ORAL_TABLET | Freq: Once | ORAL | Status: DC
Start: 2023-08-23 — End: 2023-08-23

## 2023-08-23 MED ORDER — DOXYCYCLINE HYCLATE 100 MG PO CAPS: 100.0000 mg | ORAL_CAPSULE | Freq: Two times a day (BID) | ORAL | 0 refills | Status: DC

## 2023-08-23 MED ORDER — FAM-TRASTUZUMAB DERUXTECAN-NXKI CHEMO 100 MG IV SOLR
5.3000 mg/kg | Freq: Once | INTRAVENOUS | Status: AC
  Administered 2023-08-23: 300 mg via INTRAVENOUS
  Filled 2023-08-23: qty 15

## 2023-08-23 MED ORDER — DENOSUMAB 120 MG/1.7ML ~~LOC~~ SOLN
120.0000 mg | Freq: Once | SUBCUTANEOUS | Status: AC
Start: 2023-08-23 — End: 2023-08-23
  Administered 2023-08-23: 120 mg via SUBCUTANEOUS
  Filled 2023-08-23: qty 1.7

## 2023-08-23 MED ORDER — DEXAMETHASONE SODIUM PHOSPHATE 10 MG/ML IJ SOLN
10.0000 mg | Freq: Once | INTRAMUSCULAR | Status: AC
Start: 2023-08-23 — End: 2023-08-23
  Administered 2023-08-23: 10 mg via INTRAVENOUS
  Filled 2023-08-23: qty 1

## 2023-08-23 MED ORDER — SODIUM CHLORIDE 0.9 % IV SOLN
150.0000 mg | Freq: Once | INTRAVENOUS | Status: AC
  Administered 2023-08-23: 150 mg via INTRAVENOUS
  Filled 2023-08-23: qty 150

## 2023-08-23 MED ORDER — PALONOSETRON HCL INJECTION 0.25 MG/5ML
0.2500 mg | Freq: Once | INTRAVENOUS | Status: AC
  Administered 2023-08-23: 0.25 mg via INTRAVENOUS
  Filled 2023-08-23: qty 5

## 2023-08-23 MED ORDER — DIPHENHYDRAMINE HCL 25 MG PO CAPS: 50.0000 mg | ORAL_CAPSULE | Freq: Once | ORAL | Status: DC

## 2023-08-23 NOTE — Progress Notes (Signed)
 Face to face with pt  and husband in infusion. Pt reports that she is doing okay. She admits that she doesn't have any energy but reports that that is the norm for her. Told her about the breast cancer support group that begins on 08/29/23. Encouraged pt to attend if she would like.

## 2023-08-23 NOTE — Assessment & Plan Note (Signed)
 Skin ulcers of the bilateral lower extremities being managed at the wound center. The patient states these are slowly improving.

## 2023-08-23 NOTE — Assessment & Plan Note (Addendum)
 New right hand/arm tremor of uncertain etiology, with occasional blurry vision. The tremor is less pronounced. She had an MRI brain yesterday and the report is pending. There is no glaringly obvious abnormality seen by myself or Dr. Cornelius on review of the films.  We will contact her when we have the final report.

## 2023-08-23 NOTE — Assessment & Plan Note (Signed)
 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 She was found to be iron  deficient in September 2023 and was given IV iron  supplement. Repeat evaluation in November did not reveal recurrent iron  deficiency. B12 was low normal, so I started her on B12 500 mcg daily. Folate was normal.

## 2023-08-23 NOTE — Assessment & Plan Note (Signed)
 Non specific firmness in the right abdomen, which may be hepatomegaly. There was no evidence of metastatic disease on imaging in November. Her transaminases are normal.  I will plan to repeat imaging prior to her next cycle of chemotherapy.

## 2023-08-23 NOTE — Assessment & Plan Note (Signed)
 Persistent hypokalemia.  She states she is unable to tolerate liquid potassium due to the taste nor potassium tablets due to difficulty swallowing them. She is using OTC potassium gummies, 500 mg, 2 twice daily. Her potassium is normal today, so she will continue her oral supplement.

## 2023-08-23 NOTE — Patient Instructions (Signed)
 [facility] [place] - A DEPT OF [facility]  Discharge Instructions: Thank you for choosing [facility] to provide your oncology and hematology care.  If you have a lab appointment with the Cancer Center, please go directly to the Cancer Center and check in at the registration area.   Wear comfortable clothing and clothing appropriate for easy access to any Portacath or PICC line.   We strive to give you quality time with your provider. You may need to reschedule your appointment if you arrive late (15 or more minutes).  Arriving late affects you and other patients whose appointments are after yours.  Also, if you miss three or more appointments without notifying the office, you may be dismissed from the clinic at the provider's discretion.      For prescription refill requests, have your pharmacy contact our office and allow 72 hours for refills to be completed.    Today you received the following chemotherapy and/or immunotherapy agents Enhertu      To help prevent nausea and vomiting after your treatment, we encourage you to take your nausea medication as directed.  BELOW ARE SYMPTOMS THAT SHOULD BE REPORTED IMMEDIATELY: *FEVER GREATER THAN 100.4 F (38 C) OR HIGHER *CHILLS OR SWEATING *NAUSEA AND VOMITING THAT IS NOT CONTROLLED WITH YOUR NAUSEA MEDICATION *UNUSUAL SHORTNESS OF BREATH *UNUSUAL BRUISING OR BLEEDING *URINARY PROBLEMS (pain or burning when urinating, or frequent urination) *BOWEL PROBLEMS (unusual diarrhea, constipation, pain near the anus) TENDERNESS IN MOUTH AND THROAT WITH OR WITHOUT PRESENCE OF ULCERS (sore throat, sores in mouth, or a toothache) UNUSUAL RASH, SWELLING OR PAIN  UNUSUAL VAGINAL DISCHARGE OR ITCHING   Items with * indicate a potential emergency and should be followed up as soon as possible or go to the Emergency Department if any problems should occur.  Please show the CHEMOTHERAPY ALERT CARD or IMMUNOTHERAPY ALERT  CARD at check-in to the Emergency Department and triage nurse.  Should you have questions after your visit or need to cancel or reschedule your appointment, please contact [facility] [place] - A DEPT OF [facility]  Dept: [phone]  and follow the prompts.  Office hours are 8:00 a.m. to 4:30 p.m. Monday - Friday. Please note that voicemails left after 4:00 p.m. may not be returned until the following business day.  We are closed weekends and major holidays. You have access to a nurse at all times for urgent questions. Please call the main number to the clinic Dept: [phone] and follow the prompts.  For any non-urgent questions, you may also contact your provider using MyChart. We now offer e-Visits for anyone 23 and older to request care online for non-urgent symptoms. For details visit [email or web address].   Also download the MyChart app! Go to the app store, search "MyChart", open the app, select [employer], and log in with your MyChart username and password.

## 2023-08-26 ENCOUNTER — Other Ambulatory Visit: Payer: Self-pay | Admitting: Oncology

## 2023-08-26 ENCOUNTER — Other Ambulatory Visit: Payer: Self-pay | Admitting: Hematology and Oncology

## 2023-08-26 DIAGNOSIS — R0789 Other chest pain: Secondary | ICD-10-CM

## 2023-08-26 DIAGNOSIS — M545 Low back pain, unspecified: Secondary | ICD-10-CM

## 2023-08-26 DIAGNOSIS — S22078A Other fracture of T9-T10 vertebra, initial encounter for closed fracture: Secondary | ICD-10-CM

## 2023-08-26 DIAGNOSIS — C7951 Secondary malignant neoplasm of bone: Secondary | ICD-10-CM

## 2023-08-27 ENCOUNTER — Encounter: Payer: Self-pay | Admitting: Oncology

## 2023-08-27 ENCOUNTER — Other Ambulatory Visit: Payer: Self-pay

## 2023-08-27 DIAGNOSIS — R0789 Other chest pain: Secondary | ICD-10-CM

## 2023-08-27 DIAGNOSIS — C7951 Secondary malignant neoplasm of bone: Secondary | ICD-10-CM

## 2023-08-27 DIAGNOSIS — M545 Low back pain, unspecified: Secondary | ICD-10-CM

## 2023-08-27 DIAGNOSIS — S22078A Other fracture of T9-T10 vertebra, initial encounter for closed fracture: Secondary | ICD-10-CM

## 2023-08-28 ENCOUNTER — Other Ambulatory Visit: Payer: Self-pay | Admitting: Oncology

## 2023-08-28 DIAGNOSIS — S22078A Other fracture of T9-T10 vertebra, initial encounter for closed fracture: Secondary | ICD-10-CM

## 2023-08-28 DIAGNOSIS — M545 Low back pain, unspecified: Secondary | ICD-10-CM

## 2023-08-28 DIAGNOSIS — C7951 Secondary malignant neoplasm of bone: Secondary | ICD-10-CM

## 2023-08-28 DIAGNOSIS — R0789 Other chest pain: Secondary | ICD-10-CM

## 2023-08-28 MED ORDER — OXYCODONE HCL 10 MG PO TABS: 10.0000 mg | ORAL_TABLET | ORAL | 0 refills | Status: DC | PRN

## 2023-08-29 ENCOUNTER — Encounter: Payer: Self-pay | Admitting: Oncology

## 2023-09-03 ENCOUNTER — Encounter: Payer: Self-pay | Admitting: Oncology

## 2023-09-05 ENCOUNTER — Ambulatory Visit (HOSPITAL_BASED_OUTPATIENT_CLINIC_OR_DEPARTMENT_OTHER)
Admission: RE | Admit: 2023-09-05 | Discharge: 2023-09-05 | Disposition: A | Discharge status: Home / Self Care | Payer: 59 | Source: Ambulatory Visit | Attending: Hematology and Oncology | Admitting: Hematology and Oncology

## 2023-09-05 DIAGNOSIS — C7951 Secondary malignant neoplasm of bone: Secondary | ICD-10-CM | POA: Diagnosis not present

## 2023-09-05 MED ORDER — IOHEXOL 300 MG/ML  SOLN
100.0000 mL | Freq: Once | INTRAMUSCULAR | Status: AC | PRN
  Administered 2023-09-05: 80 mL via INTRAVENOUS

## 2023-09-09 ENCOUNTER — Other Ambulatory Visit (HOSPITAL_BASED_OUTPATIENT_CLINIC_OR_DEPARTMENT_OTHER): Payer: 59 | Admitting: Radiology

## 2023-09-11 NOTE — Progress Notes (Shared)
[facility]  [address] [place],  [place]  [address] [phone]  Clinic Day:  09/13/2023  Referring physician: Galvin Proffer, MD  ASSESSMENT & PLAN:  Assessment: Malignant neoplasm metastatic to bone [facility]) Bone metastases diagnosed in November 2022, with multiple marrow replacing lesions within the proximal right femur within the ischium as well as the inferior rami. 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 Ki67 was 60%. PET imaging in December revealed dominant finding of intensely hypermetabolic aggressive skeletal metastasis involving the calvarium, sternum, thoracic and lumbar spine, and pelvis, with potential pathologic fracture of the right femur with internal fixation. There was soft tissue extension into the anterior mediastinum associated with the manubrial metastasis. Multiple spinal vertebral body lesions had cortical destruction along the posterior margin. She started monthly Xgeva in December, and completed radiation therapy. She was receiving THP, but cycle 4 was delayed and dose reduction made by 20% due to left leg skin infection. She continued with Herceptin/Perjeta until she had progression of disease. In January 2024, she developed increased pain of the left hip. She was found to have innumerable metastatic lesions throughout the pelvis and proximal femur. There is a non displaced pathologic fracture through a large lesion in the superior left acetabulum. She was referred to an orthopedic oncologist at [facility] and they recommended radiation and no surgery. She received her palliative radiation. She followed-up with [facility] in April and they do not recommend surgery, but will continue surveillance.   We then switched her to [facility] chemotherapy, but she has had significant toxicities of nausea, vomiting, diarrhea, and hypokalemia.  She was also placed on hormonal  therapy with Faslodex injections.  Repeat CT imaging in July revealed decreased soft tissue thickening around the left subclavian structures and thoracic inlet.  Left lateral breast mass was stable.  Widespread mixed lytic and sclerotic osseous metastatic lesions were stable.  There was unchanged severe elevation of the left hemidiaphragm.  She continues Xgeva for her bone metastases, which is being given every 6 weeks to coincide with chemotherapy. CT neck in October to evaluate for possible soft tissue mass and painful swallowing did not reveal any soft tissue masses.  The extensive mixed lytic and sclerotic skeletal metastases were much more pronounced in the visible upper thorax than in the cervical spine and were felt to be grossly stable from July 2024. CT chest, abdomen and pelvis in November, 2024 revealed stable bony disease without evidence of new/progressive disease. Her pain remains fairly well-controlled on the current regimen.  She will proceed with 16th cycle of Enhertu today.  She will continue denosumab every 6 weeks.     Tremor New right hand/arm tremor of uncertain etiology, with occasional blurry vision. The tremor is less pronounced. She had an MRI brain in January, 2025 revealed multiple sclerotic bone lesions but no brain abnormalities.   Skin ulcer (HCC) Skin ulcers of the bilateral lower extremities being managed at the wound center. The patient states these are slowly improving.    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 She was found to be iron deficient in September 2023 and was given IV iron supplement. Repeat evaluation in November did not reveal recurrent iron deficiency. B12 was low normal, so I started her on B12 500 mcg daily. Folate was normal.   Hypokalemia Persistent hypokalemia.  She states she is unable  to tolerate liquid potassium due to the taste nor potassium tablets due to difficulty  swallowing them. She is using OTC potassium gummies, 500 mg, 2 twice daily. Her potassium is normal today, so she will continue her oral supplement.   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 We will stop the drug and request panorex X-rays. For now we will plan just observation.   Plan: I informed her that a rare side effect of Xgeva is osteonecrosis of the jaw and recommended we stop her injections. I will contact her dentist office and request a panorex x-ray to be done for further evaluation. She has a appointment with her cardiologist in March for a 6 month follow-up. She had a MRI head done on 08/20/2023 that revealed multiple sclerotic foci within the calvarium that is likely treated osseous metastasis. She also had a CT chest, abdomen, and pelvis done on 09/05/2023 that reveals unchanged size of the mass in the left breast with overlying skin thickening, no significant interval change in the widespread osseous metastatic disease involving the axial and proximal appendicular skeleton, similar soft tissue thickening about the left subclavian structures and thoracic inlet, possibly reflecting posttreatment change. No evidence of new or progressive disease within the chest, abdomen, or pelvis was also noted. Her day 1 cycle 17 of Enhertu is scheduled on 09/13/2023. She has a WBC of 5.3, a low hemoglobin of 10.6 up from 10.3, and platelet count of 347,000. Her CMP and magnesium are pending. I will add a CA 27.29 to her labs today. I will see her back in 3 weeks with CBC and CMP. The patient understands the plans discussed today and is in agreement with them.  She knows to contact our office if she develops concerns prior to her next appointment.   I provided 23 minutes of face-to-face time during this encounter and > 50% was spent counseling as documented under my assessment and plan.   Dellia Beckwith, MD  [facility] - A DEPT OF [facility] [address] [place] [address] Dept: [phone] Dept Fax: [phone]   No orders of the defined types were placed in this encounter.  CHIEF COMPLAINT:  CC: Metastatic hormone receptor positive breast cancer  Current Treatment: Enhertu every 3 weeks/denosumab every 6 weeks  HISTORY OF PRESENT ILLNESS:   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 (HCC)   07/23/2013 Cancer Staging   Staging form: Breast, AJCC 7th Edition - Clinical stage from 07/23/2013: Stage IIB (T2, N1, M0) - Signed by Dellia Beckwith, MD on 07/04/2020 Prognostic indicators: Pos LVI   09/22/2021 - 04/06/2022 Chemotherapy   Patient is on Treatment Plan : BREAST  Trastuzumab + Pertuzumab q21d      09/22/2021 - 09/21/2022 Chemotherapy   Patient is on Treatment Plan : BREAST Trastuzumab + Pertuzumab q21d     10/12/2022 -  Chemotherapy   Patient is on Treatment Plan : BREAST METASTATIC Fam-Trastuzumab Deruxtecan-nxki (Enhertu) (5.4) q21d     Malignant neoplasm metastatic to bone (HCC)  06/28/2021 Initial Diagnosis   Bone metastases (HCC)   09/22/2021 - 04/06/2022 Chemotherapy   Patient is on Treatment Plan : BREAST  Trastuzumab + Pertuzumab q21d      09/22/2021 - 09/21/2022 Chemotherapy  Patient is on Treatment Plan : BREAST Trastuzumab + Pertuzumab q21d     05/11/2022 Imaging   CT chest, abdomen and pelvis:  IMPRESSION:  1. Widespread bony metastatic disease shows increased sclerosis  since previous imaging. This is likely related to response to  therapy in the interval. There is a single lesion along the LEFT  iliac which shows continued lytic change and warrants attention on  follow-up  2. Interval development of pathologic fractures at in the upper  thoracic spine and at the  thoracolumbar junction with associated  kyphosis.  3. No signs of solid organ or nodal metastatic disease.  4. Marked elevation of the LEFT hemidiaphragm as on the prior PET.  5. Aortic atherosclerosis.    10/12/2022 -  Chemotherapy   Patient is on Treatment Plan : BREAST METASTATIC Fam-Trastuzumab Deruxtecan-nxki (Enhertu) (5.4) q21d         INTERVAL HISTORY:  Anna [name] is here today for repeat clinical assessment for her metastatic hormone receptor positive breast cancer. Patient states that she feels ok but complains of constipation, lower back pain rating 4/10, right jaw pain rating 7/10  and teeth pain with irritated gums that has persisted for the last 3 weeks. During her last office visit with [facility] she was prescribed antibiotics for this pain and additional throat soreness, her throat soreness has since then resolved. 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 I will contact her dentist office and request a panorex x-ray to be done for further evaluation. She has a appointment with her cardiologist in March for a 6 month follow-up. She had a MRI head done on 08/20/2023 that revealed multiple sclerotic foci within the calvarium that is likely treated osseous metastasis. She also had a CT chest, abdomen, and pelvis done on 09/05/2023 that reveals unchanged size of the mass in the left breast with overlying skin thickening, no significant interval change in the widespread osseous metastatic disease involving the axial and proximal appendicular skeleton, similar soft tissue thickening about the left subclavian structures and thoracic inlet, possibly reflecting posttreatment change. No evidence of new or progressive disease within the chest, abdomen, or pelvis was also noted. Her day 1 cycle 17 of Enhertu is scheduled on 09/13/2023. She has a WBC of 5.3, a low hemoglobin of 10.6 up from 10.3, and platelet count of 347,000. Her CMP  and magnesium are pending. I will add a CA 27.29 to her labs today. I will see her back in 3 weeks with CBC and CMP.   She denies signs of infection such as sore throat, sinus drainage, cough, or urinary symptoms.  She denies fevers or recurrent chills. She denies nausea, vomiting, chest pain, dyspnea or cough. Her appetite is good and improved and her weight has increased 3 pounds over last 3 weeks .   REVIEW OF SYSTEMS:  Review of Systems  Constitutional:  Negative for appetite change, chills, fatigue, fever and unexpected weight change.  HENT:   Positive for sore throat and trouble swallowing. Negative for lump/mass, mouth sores, nosebleeds, tinnitus and voice change.        Right jaw and teeth pain for the last 3 weeks, 7/10  Eyes:  Positive for eye problems (blurry vision). Negative for icterus.  Respiratory:  Negative for cough, hemoptysis and shortness of breath.   Cardiovascular:  Negative for chest pain and leg swelling.  Gastrointestinal:  Positive for constipation. Negative for abdominal pain, blood  in stool, diarrhea, nausea and vomiting.  Endocrine: Negative for hot flashes.  Genitourinary:  Negative for difficulty urinating, dysuria, frequency and hematuria.   Musculoskeletal:  Positive for back pain (4/10) and neck pain. Negative for arthralgias, gait problem and myalgias.  Skin:  Negative for itching, rash and wound.  Neurological:  Positive for numbness (neuropathy bilateral hands and feet). Negative for dizziness, extremity weakness, gait problem, headaches and light-headedness.  Hematological:  Negative for adenopathy. Does not bruise/bleed easily.  Psychiatric/Behavioral:  Negative for confusion, depression and sleep disturbance. The patient is not nervous/anxious.      VITALS:  Blood pressure 106/78, pulse (!) 109, temperature 98.3 F (36.8 C), temperature source Oral, resp. rate 16, height 4\' 10"  (1.473 m), weight 128 lb 3.2 oz (58.2 kg), last menstrual period  08/18/2013, SpO2 96%.  Wt Readings from Last 3 Encounters:  09/13/23 128 lb 3.2 oz (58.2 kg)  08/23/23 125 lb 1.6 oz (56.7 kg)  08/02/23 121 lb 6.4 oz (55.1 kg)    Body mass index is 26.79 kg/m.  Performance status (ECOG): 2 - Symptomatic, <50% confined to bed  PHYSICAL EXAM:  Physical Exam Vitals and nursing note reviewed.  Constitutional:      General: She is not in acute distress.    Appearance: Normal appearance. She is normal weight. She is not ill-appearing, toxic-appearing or diaphoretic.  HENT:     Head: Normocephalic and atraumatic.     Mouth/Throat:     Mouth: Mucous membranes are moist.     Pharynx: Oropharynx is clear.  Eyes:     General: No scleral icterus.    Extraocular Movements: Extraocular movements intact.     Conjunctiva/sclera: Conjunctivae normal.     Pupils: Pupils are equal, round, and reactive to light.  Neck:     Comments: Limited ability to extend the neck Cardiovascular:     Rate and Rhythm: Normal rate and regular rhythm.     Pulses: Normal pulses.     Heart sounds: Normal heart sounds. No murmur heard.    No friction rub. No gallop.  Pulmonary:     Effort: Pulmonary effort is normal. No respiratory distress.     Breath sounds: No decreased air movement. No decreased breath sounds, wheezing, rhonchi or rales.  Chest:     Comments: Breast exam is deferred Abdominal:     General: Bowel sounds are normal. There is no distension.     Palpations: Abdomen is soft. There is no hepatomegaly, splenomegaly or mass.     Tenderness: There is no abdominal tenderness.     Comments: Non specific firmness of the right abdomen,  Musculoskeletal:     Cervical back: Rigidity (chronic) present. No tenderness. No spinous process tenderness or muscular tenderness. Decreased range of motion.     Right lower leg: No edema.     Left lower leg: No edema.  Lymphadenopathy:     Cervical: No cervical adenopathy.     Right cervical: No superficial, deep or posterior  cervical adenopathy.    Left cervical: No superficial, deep or posterior cervical adenopathy.     Upper Body:     Right upper body: No supraclavicular, axillary or pectoral adenopathy.     Left upper body: No supraclavicular, axillary or pectoral adenopathy.  Skin:    General: Skin is warm and dry.     Coloration: Skin is not jaundiced.     Findings: No rash.  Neurological:     General: No focal deficit present.  Mental Status: She is alert and oriented to person, place, and time. Mental status is at baseline.     Cranial Nerves: No cranial nerve deficit.  Psychiatric:        Mood and Affect: Mood normal.        Behavior: Behavior normal.        Thought Content: Thought content normal.        Judgment: Judgment normal.     LABS:      Latest Ref Rng & Units 09/13/2023    8:19 AM 08/23/2023    9:13 AM 08/02/2023    9:12 AM  CBC  WBC 4.0 - 10.5 K/uL 5.3  4.9  5.6   Hemoglobin 12.0 - 15.0 g/dL 62.8  31.5  17.6   Hematocrit 36.0 - 46.0 % 32.6  32.3  33.2   Platelets 150 - 400 K/uL 347  357  373       Latest Ref Rng & Units 09/13/2023    8:19 AM 08/23/2023    9:13 AM 08/02/2023    9:12 AM  CMP  Glucose 70 - 99 mg/dL 160  737  106   BUN 6 - 20 mg/dL 23  19  20    Creatinine 0.44 - 1.00 mg/dL 2.69  4.85  4.62   Sodium 135 - 145 mmol/L 139  141  139   Potassium 3.5 - 5.1 mmol/L 4.2  4.0  4.0   Chloride 98 - 111 mmol/L 101  102  100   CO2 22 - 32 mmol/L 30  30  28    Calcium 8.9 - 10.3 mg/dL 9.4  9.5  9.3   Total Protein 6.5 - 8.1 g/dL 5.7  5.9  5.9   Total Bilirubin 0.0 - 1.2 mg/dL <7.0  <3.5  0.3   Alkaline Phos 38 - 126 U/L 105  101  83   AST 15 - 41 U/L 20  21  14    ALT 0 - 44 U/L 19  25  8       Lab Results  Component Value Date   CEA1 2.2 07/20/2021   /  CEA  Date Value Ref Range Status  07/20/2021 2.2 0.0 - 4.7 ng/mL Final    Comment:    (NOTE)                             Nonsmokers          <3.9                             Smokers             <5.6 Roche  [employer] Electrochemiluminescence Immunoassay (ECLIA) Values obtained with different assay methods or kits cannot be used interchangeably.  Results cannot be interpreted as absolute evidence of the presence or absence of malignant disease. Performed At: [facility] [address] [place], [place] [id number] Jolene Schimke MD HB:[id number]    Lab Results  Component Value Date   TIBC 225 (L) 07/10/2023   TIBC 256 01/14/2023   TIBC 287 04/24/2022   FERRITIN 305 07/10/2023   FERRITIN 333 (H) 01/14/2023   FERRITIN 119 04/24/2022   IRONPCTSAT 11 07/10/2023   IRONPCTSAT 16 01/14/2023   IRONPCTSAT 9 (L) 04/24/2022   Lab Results  Component Value Date   LDH 171 07/10/2023    STUDIES:  CT CHEST ABDOMEN PELVIS W CONTRAST Result Date:  09/05/2023 CLINICAL DATA:  History of stage IV breast cancer, follow-up. * Tracking Code: BO * EXAM: CT CHEST, ABDOMEN, AND PELVIS WITH CONTRAST TECHNIQUE: Multidetector CT imaging of the chest, abdomen and pelvis was performed following the standard protocol during bolus administration of intravenous contrast. RADIATION DOSE REDUCTION: This exam was performed according to the departmental dose-optimization program which includes automated exposure control, adjustment of the mA and/or kV according to patient size and/or use of iterative reconstruction technique. CONTRAST:  80mL OMNIPAQUE IOHEXOL 300 MG/ML  SOLN COMPARISON:  Multiple priors including most recent CT July 05, 2023 FINDINGS: CT CHEST FINDINGS Cardiovascular: Right chest Port-A-Cath with tip in the right atrium. Aortic atherosclerosis. No central pulmonary embolus on this nondedicated study. Normal size heart. No significant pericardial effusion/thickening. Mediastinum/Nodes: Similar soft tissue thickening about the left subclavian structures and thoracic inlet for instance on image 116/301. No discretely enlarged mediastinal, hilar or axillary lymph nodes. No suspicious thyroid nodule. The  esophagus is grossly unremarkable. Lungs/Pleura: Similar elevation of the left hemidiaphragm with associated relaxation atelectasis. No suspicious pulmonary nodules or masses. No pleural effusion. No pneumothorax. Musculoskeletal: Mass in the lateral left breast measures 13 x 11 mm on image 37/301, unchanged. Similar overlying skin thickening of the left breast. Osseous metastatic disease involving the axial and proximal appendicular skeleton is similar prior examination. CT ABDOMEN PELVIS FINDINGS Hepatobiliary: No suspicious hepatic lesion. Gallbladder is unremarkable. No biliary ductal dilation. Pancreas: No pancreatic ductal dilation or evidence of acute inflammation. Spleen: No splenomegaly or focal splenic lesion. Adrenals/Urinary Tract: Bilateral adrenal glands appear normal. No hydronephrosis. Right lower pole renal cyst measuring 15 cm. Kidneys demonstrate symmetric enhancement. Urinary bladder is unremarkable for degree of distension. Stomach/Bowel: No radiopaque enteric contrast material was administered. No pathologic dilation of small or large bowel. No evidence of acute bowel inflammation. Vascular/Lymphatic: Aortic atherosclerosis. Normal caliber abdominal aorta. Smooth IVC contours. The portal, splenic and superior mesenteric veins are patent. No pathologically enlarged abdominal or pelvic lymph nodes. Reproductive: No suspicious mass or acute finding. Other: No significant abdominopelvic free fluid. Musculoskeletal: No significant interval change in the widespread osseous metastatic disease involving the axial and proximal appendicular skeleton. Pathologic wedging deformities at T12 and L1 status post vertebral augmentation appears similar prior. Additional untreated endplate deformities at multiple levels appear unchanged from prior. Right total hip arthroplasty. IMPRESSION: 1. Unchanged size of the mass in the left breast with overlying skin thickening. 2. No significant interval change in the  widespread osseous metastatic disease involving the axial and proximal appendicular skeleton. 3. Similar soft tissue thickening about the left subclavian structures and thoracic inlet, possibly reflecting posttreatment change. Suggest continued attention on follow-up imaging. 4. No evidence of new or progressive disease within the chest, abdomen or pelvis. 5.  Aortic Atherosclerosis (ICD10-I70.0). Electronically Signed   By: Maudry Mayhew M.D.   On: 09/05/2023 14:54     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   Bipolar disorder (HCC) 11/29/2020   Breast cancer (HCC)    Cancer (HCC)    Cardiac murmur    Chest pain    Chronic fatigue syndrome 11/29/2020   Depression    Essential hypertension 11/29/2020   Hepatic steatosis determined by biopsy of liver 07/04/2020   HLD (hyperlipidemia)    Hypertension    Hypomagnesemia 03/26/2023  Hypothyroidism 11/29/2020   Insomnia 11/29/2020   Iron deficiency anemia 06/05/2022   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   Migraine 11/29/2020   Mild intermittent asthma 11/29/2020   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   Mixed hyperlipidemia 11/29/2020   Obesity due to excess calories    Osteopenia after menopause 07/04/2020   Other long term (current) drug therapy 11/29/2020   Other vitamin B12 deficiency anemias 11/29/2020   Personal history of malignant neoplasm of breast 11/29/2020   Pre-diabetes    Renal cyst, acquired, right 07/04/2020   14.5 cm in October 2021   Retrocalcaneal bursitis (back of heel), left 04/22/2018   Seasonal allergic rhinitis 11/29/2020   Tightness of heel cord, left 04/22/2018   Tremor 08/02/2023   Type 2 diabetes mellitus without complications (HCC)  11/29/2020   Vitamin D deficiency 11/29/2020    Past Surgical History:  Procedure Laterality Date   CESAREAN SECTION     IR BONE TUMOR(S)RF ABLATION  05/29/2022   IR BONE TUMOR(S)RF ABLATION  05/29/2022   IR KYPHO EA ADDL LEVEL THORACIC OR LUMBAR  05/29/2022   IR KYPHO THORACIC WITH BONE BIOPSY  05/29/2022   IR RADIOLOGIST EVAL & MGMT  05/22/2022   IR RADIOLOGIST EVAL & MGMT  06/12/2022   LIVER BIOPSY  01/2019   lumpectomy  2014   Left Breast   PORT-A-CATH REMOVAL     PORTACATH PLACEMENT  08/28/2013   STOMACH SURGERY     Tummy Tuck   TOTAL HIP ARTHROPLASTY Right 07/04/2021   Procedure: TOTAL HIP ARTHROPLASTY;  Surgeon: Durene Romans, MD;  Location: [facility];  Service: Orthopedics;  Laterality: Right;  90   WISDOM TOOTH EXTRACTION      Family History  Problem Relation Age of Onset   Hyperlipidemia Mother    Diabetes Father    Stroke Father    Colon cancer Neg Hx    Colon polyps Neg Hx    Esophageal cancer Neg Hx    Rectal cancer Neg Hx    Stomach cancer Neg Hx     Social History:  reports that she quit smoking about 21 years ago. Her smoking use included cigarettes. She has never used smokeless tobacco. She reports current alcohol use. She reports that she does not use drugs.The patient is accompanied by her husband today.  Allergies:  Allergies  Allergen Reactions   Reglan [Metoclopramide]     Made patient feel really funny when taking this medication    Xgeva [Denosumab] Other (See Comments)    Possible ONJ (09/13/23)    Current Medications: Current Outpatient Medications  Medication Sig Dispense Refill   albuterol (VENTOLIN HFA) 108 (90 Base) MCG/ACT inhaler Inhale 2 puffs into the lungs every 6 (six) hours as needed for shortness of breath.     ARIPiprazole (ABILIFY) 5 MG tablet Take 2.5 mg by mouth daily.     aspirin 81 MG EC tablet Take 81 mg by mouth daily. Swallow whole.     atorvastatin (LIPITOR) 20 MG tablet TAKE ONE TABLET BY MOUTH EVERY DAY 90 tablet 1    augmented betamethasone dipropionate (DIPROLENE-AF) 0.05 % cream      dapagliflozin propanediol (FARXIGA) 10 MG TABS tablet TAKE ONE TABLET BY MOUTH EVERY DAY 30 tablet 10   desvenlafaxine (PRISTIQ) 50 MG 24 hr tablet Take 50 mg by mouth daily.     dexamethasone (DECADRON) 4 MG tablet TAKE ONE TABLET BY MOUTH 2 TIMES A DAY FOR 5 DAYS  AFTER CHEMOTHERAPY 20 tablet 2   diphenoxylate-atropine (LOMOTIL) 2.5-0.025 MG tablet TAKE TWO TABLETS BY MOUTH FOUR TIMES A DAY AS NEEDED FOR DIARRHEA OR LOOSE STOOLS 100 tablet 1   doxycycline (VIBRAMYCIN) 100 MG capsule Take 1 capsule (100 mg total) by mouth 2 (two) times daily. 20 capsule 0   labetalol (NORMODYNE) 100 MG tablet Take 1 tablet (100 mg total) by mouth 2 (two) times daily. 180 tablet 3   loratadine (CLARITIN) 10 MG tablet TAKE ONE TABLET BY MOUTH DAILY 30 tablet 2   LORazepam (ATIVAN) 1 MG tablet Take 1 tablet (1 mg total) by mouth every 8 (eight) hours. 30 tablet 1   magic mouthwash (lidocaine, diphenhydrAMINE, alum & mag hydroxide) suspension Take 5 mLs by mouth every 3 (three) hours as needed. Swish and spit; for throat/mouth pain     naloxone (NARCAN) nasal spray 4 mg/0.1 mL One spray in nostril as needed, may repeat every 2 to 3 minutes until medical assistance becomes available 4 each 1   nitroGLYCERIN (NITROSTAT) 0.4 MG SL tablet Place 0.4 mg under the tongue every 5 (five) minutes as needed for chest pain.     nystatin (MYCOSTATIN/NYSTOP) powder Apply 1 Application topically 2 (two) times daily.     omeprazole (PRILOSEC) 40 MG capsule Take 1 capsule (40 mg total) by mouth at bedtime. 30 capsule 5   ondansetron (ZOFRAN) 8 MG tablet Take 1 tablet (8 mg total) by mouth every 8 (eight) hours as needed for nausea or vomiting. 60 tablet 5   Oxycodone HCl 10 MG TABS Take 1 tablet (10 mg total) by mouth every 4 (four) hours as needed. for pain 180 tablet 0   potassium chloride SA (KLOR-CON M) 20 MEQ tablet Take 1 tablet (20 mEq total) by mouth 2 (two)  times daily. (Patient taking differently: Take 20 mEq by mouth 2 (two) times daily. Taking potasium citrate 1000 mg per two gummies) 60 tablet 1   prochlorperazine (COMPAZINE) 10 MG tablet TAKE ONE TABLET BY MOUTH EVERY 6 HOURS AS NEEDED FOR NAUSEA/VOMITING 90 tablet 3   Current Facility-Administered Medications  Medication Dose Route Frequency Provider Last Rate Last Admin   technetium sestamibi generic (CARDIOLITE) injection 10.5 millicurie  10.5 millicurie Intravenous Once PRN Revankar, Aundra Dubin, MD       Facility-Administered Medications Ordered in Other Visits  Medication Dose Route Frequency Provider Last Rate Last Admin   acetaminophen (TYLENOL) tablet 650 mg  650 mg Oral Once Dellia Beckwith, MD       diphenhydrAMINE (BENADRYL) capsule 50 mg  50 mg Oral Once Dellia Beckwith, MD       sodium chloride flush (NS) 0.9 % injection 10 mL  10 mL Intracatheter PRN Dellia Beckwith, MD   10 mL at 09/13/23 1129    I,Jasmine M Lassiter,acting as a scribe for Dellia Beckwith, MD.,have documented all relevant documentation on the behalf of Dellia Beckwith, MD,as directed by  Dellia Beckwith, MD while in the presence of Dellia Beckwith, MD.

## 2023-09-13 ENCOUNTER — Inpatient Hospital Stay: Payer: 59

## 2023-09-13 ENCOUNTER — Inpatient Hospital Stay: Payer: 59 | Admitting: Oncology

## 2023-09-13 ENCOUNTER — Other Ambulatory Visit: Payer: Self-pay | Admitting: Oncology

## 2023-09-13 ENCOUNTER — Other Ambulatory Visit: Payer: Self-pay

## 2023-09-13 ENCOUNTER — Encounter: Payer: Self-pay | Admitting: Oncology

## 2023-09-13 VITALS — HR 96

## 2023-09-13 VITALS — BP 106/78 | HR 109 | Temp 98.3°F | Resp 16 | Ht <= 58 in | Wt 128.2 lb

## 2023-09-13 DIAGNOSIS — C50412 Malignant neoplasm of upper-outer quadrant of left female breast: Secondary | ICD-10-CM | POA: Diagnosis not present

## 2023-09-13 DIAGNOSIS — C7951 Secondary malignant neoplasm of bone: Secondary | ICD-10-CM | POA: Diagnosis not present

## 2023-09-13 DIAGNOSIS — Z170 Estrogen receptor positive status [ER+]: Secondary | ICD-10-CM | POA: Diagnosis not present

## 2023-09-13 DIAGNOSIS — Z5111 Encounter for antineoplastic chemotherapy: Secondary | ICD-10-CM | POA: Diagnosis not present

## 2023-09-13 DIAGNOSIS — M87180 Osteonecrosis due to drugs, jaw: Secondary | ICD-10-CM

## 2023-09-13 LAB — CBC WITH DIFFERENTIAL (CANCER CENTER ONLY)
Abs Immature Granulocytes: 0.03 10*3/uL (ref 0.00–0.07)
Basophils Absolute: 0 10*3/uL (ref 0.0–0.1)
Basophils Relative: 1 %
Eosinophils Absolute: 0.4 10*3/uL (ref 0.0–0.5)
Eosinophils Relative: 8 %
HCT: 32.6 % — ABNORMAL LOW (ref 36.0–46.0)
Hemoglobin: 10.6 g/dL — ABNORMAL LOW (ref 12.0–15.0)
Immature Granulocytes: 1 %
Lymphocytes Relative: 11 %
Lymphs Abs: 0.6 10*3/uL — ABNORMAL LOW (ref 0.7–4.0)
MCH: 28.4 pg (ref 26.0–34.0)
MCHC: 32.5 g/dL (ref 30.0–36.0)
MCV: 87.4 fL (ref 80.0–100.0)
Monocytes Absolute: 0.7 10*3/uL (ref 0.1–1.0)
Monocytes Relative: 14 %
Neutro Abs: 3.5 10*3/uL (ref 1.7–7.7)
Neutrophils Relative %: 65 %
Platelet Count: 347 10*3/uL (ref 150–400)
RBC: 3.73 MIL/uL — ABNORMAL LOW (ref 3.87–5.11)
RDW: 18 % — ABNORMAL HIGH (ref 11.5–15.5)
WBC Count: 5.3 10*3/uL (ref 4.0–10.5)
nRBC: 0 % (ref 0.0–0.2)
nRBC: 0 /100{WBCs}

## 2023-09-13 LAB — COMPREHENSIVE METABOLIC PANEL
ALT: 19 U/L (ref 0–44)
AST: 20 U/L (ref 15–41)
Albumin: 3.6 g/dL (ref 3.5–5.0)
Alkaline Phosphatase: 105 U/L (ref 38–126)
Anion gap: 8 (ref 5–15)
BUN: 23 mg/dL — ABNORMAL HIGH (ref 6–20)
CO2: 30 mmol/L (ref 22–32)
Calcium: 9.4 mg/dL (ref 8.9–10.3)
Chloride: 101 mmol/L (ref 98–111)
Creatinine, Ser: 0.51 mg/dL (ref 0.44–1.00)
GFR, Estimated: >60 mL/min (ref >60)
Glucose, Bld: 118 mg/dL — ABNORMAL HIGH (ref 70–99)
Potassium: 4.2 mmol/L (ref 3.5–5.1)
Sodium: 139 mmol/L (ref 135–145)
Total Bilirubin: <0.2 mg/dL (ref 0.0–1.2)
Total Protein: 5.7 g/dL — ABNORMAL LOW (ref 6.5–8.1)

## 2023-09-13 LAB — MAGNESIUM: Magnesium: 2 mg/dL (ref 1.7–2.4)

## 2023-09-13 MED ORDER — DIPHENHYDRAMINE HCL 25 MG PO CAPS: 50.0000 mg | ORAL_CAPSULE | Freq: Once | ORAL | Status: DC

## 2023-09-13 MED ORDER — SODIUM CHLORIDE 0.9 % IV SOLN
150.0000 mg | Freq: Once | INTRAVENOUS | Status: AC
  Administered 2023-09-13: 150 mg via INTRAVENOUS
  Filled 2023-09-13: qty 150

## 2023-09-13 MED ORDER — DEXTROSE 5 % IV SOLN: Freq: Once | INTRAVENOUS | Status: AC

## 2023-09-13 MED ORDER — FULVESTRANT 250 MG/5ML IM SOSY
500.0000 mg | PREFILLED_SYRINGE | Freq: Once | INTRAMUSCULAR | Status: AC
  Administered 2023-09-13: 500 mg via INTRAMUSCULAR
  Filled 2023-09-13: qty 10

## 2023-09-13 MED ORDER — PALONOSETRON HCL INJECTION 0.25 MG/5ML
0.2500 mg | Freq: Once | INTRAVENOUS | Status: AC
  Administered 2023-09-13: 0.25 mg via INTRAVENOUS
  Filled 2023-09-13: qty 5

## 2023-09-13 MED ORDER — FAM-TRASTUZUMAB DERUXTECAN-NXKI CHEMO 100 MG IV SOLR
5.4000 mg/kg | Freq: Once | INTRAVENOUS | Status: AC
  Administered 2023-09-13: 340 mg via INTRAVENOUS
  Filled 2023-09-13: qty 17

## 2023-09-13 MED ORDER — DEXAMETHASONE SODIUM PHOSPHATE 10 MG/ML IJ SOLN
10.0000 mg | Freq: Once | INTRAMUSCULAR | Status: AC
  Administered 2023-09-13: 10 mg via INTRAVENOUS
  Filled 2023-09-13: qty 1

## 2023-09-13 MED ORDER — ACETAMINOPHEN 325 MG PO TABS: 650.0000 mg | ORAL_TABLET | Freq: Once | ORAL | Status: DC

## 2023-09-13 MED ORDER — HEPARIN SOD (PORK) LOCK FLUSH 100 UNIT/ML IV SOLN
500.0000 [IU] | Freq: Once | INTRAVENOUS | Status: AC | PRN
  Administered 2023-09-13: 500 [IU]

## 2023-09-13 MED ORDER — SODIUM CHLORIDE 0.9% FLUSH
10.0000 mL | INTRAVENOUS | Status: DC | PRN
  Administered 2023-09-13: 10 mL

## 2023-09-13 NOTE — Patient Instructions (Signed)
 Fam-Trastuzumab Deruxtecan Injection What is this medication? FAM-TRASTUZUMAB DERUXTECAN (fam-tras TOOZ eu mab DER ux TEE kan) treats some types of cancer. It works by blocking a protein that causes cancer cells to grow and multiply. This helps to slow or stop the spread of cancer cells. This medicine may be used for other purposes; ask your health care provider or pharmacist if you have questions. COMMON BRAND NAME(S): ENHERTU What should I tell my care team before I take this medication? They need to know if you have any of these conditions: Heart disease Heart failure Infection, especially a viral infection, such as chickenpox, cold sores, or herpes Liver disease Lung or breathing disease, such as asthma or COPD An unusual or allergic reaction to fam-trastuzumab deruxtecan, other medications, foods, dyes, or preservatives Pregnant or trying to get pregnant Breast-feeding How should I use this medication? This medication is injected into a vein. It is given by your care team in a hospital or clinic setting. A special MedGuide will be given to you before each treatment. Be sure to read this information carefully each time. Talk to your care team about the use of this medication in children. Special care may be needed. Overdosage: If you think you have taken too much of this medicine contact a poison control center or emergency room at once. NOTE: This medicine is only for you. Do not share this medicine with others. What if I miss a dose? It is important not to miss your dose. Call your care team if you are unable to keep an appointment. What may interact with this medication? Interactions are not expected. This list may not describe all possible interactions. Give your health care provider a list of all the medicines, herbs, non-prescription drugs, or dietary supplements you use. Also tell them if you smoke, drink alcohol, or use illegal drugs. Some items may interact with your  medicine. What should I watch for while using this medication? Visit your care team for regular checks on your progress. Tell your care team if your symptoms do not start to get better or if they get worse. This medication may increase your risk of getting an infection. Call your care team for advice if you get a fever, chills, sore throat, or other symptoms of a cold or flu. Do not treat yourself. Try to avoid being around people who are sick. Avoid taking medications that contain aspirin, acetaminophen, ibuprofen, naproxen, or ketoprofen unless instructed by your care team. These medications may hide a fever. Be careful brushing or flossing your teeth or using a toothpick because you may get an infection or bleed more easily. If you have any dental work done, tell your dentist you are receiving this medication. This medication may cause dry eyes and blurred vision. If you wear contact lenses, you may feel some discomfort. Lubricating eye drops may help. See your care team if the problem does not go away or is severe. Talk to your care team if you may be pregnant. Serious birth defects can occur if you take this medication during pregnancy and for 7 months after the last dose. If your partner can get pregnant, use a condom during sex while taking this medication and for 4 months after the last dose. Do not breastfeed while taking this medication and for 7 months after the last dose. This medication may cause infertility. Talk to your care team if you are concerned about your fertility. What side effects may I notice from receiving this medication? Side effects  that you should report to your care team as soon as possible: Allergic reactions--skin rash, itching, hives, swelling of the face, lips, tongue, or throat Dry cough, shortness of breath or trouble breathing Infection--fever, chills, cough, sore throat, wounds that don't heal, pain or trouble when passing urine, general feeling of discomfort or  being unwell Heart failure--shortness of breath, swelling of the ankles, feet, or hands, sudden weight gain, unusual weakness or fatigue Unusual bruising or bleeding Side effects that usually do not require medical attention (report these to your care team if they continue or are bothersome): Constipation Diarrhea Hair loss Muscle pain Nausea Vomiting This list may not describe all possible side effects. Call your doctor for medical advice about side effects. You may report side effects to FDA at 1-800-FDA-1088. Where should I keep my medication? This medication is given in a hospital or clinic. It will not be stored at home. NOTE: This sheet is a summary. It may not cover all possible information. If you have questions about this medicine, talk to your doctor, pharmacist, or health care provider.  2024 [employer] (2023-03-29 00:00:00)

## 2023-09-14 LAB — CANCER ANTIGEN 27.29: CA 27.29: 28.8 U/mL (ref 0.0–38.6)

## 2023-09-16 ENCOUNTER — Other Ambulatory Visit: Payer: Self-pay

## 2023-09-16 ENCOUNTER — Other Ambulatory Visit: Payer: Self-pay | Admitting: Hematology and Oncology

## 2023-09-20 ENCOUNTER — Encounter: Payer: Self-pay | Admitting: Oncology

## 2023-09-20 ENCOUNTER — Other Ambulatory Visit: Payer: Self-pay

## 2023-09-20 DIAGNOSIS — M87180 Osteonecrosis due to drugs, jaw: Secondary | ICD-10-CM | POA: Insufficient documentation

## 2023-09-20 NOTE — Telephone Encounter (Signed)
 Contacted patient and notified her of the message. Will be having some dental work done on 09/23/23 and will need Amoxicillin  refilled (sent to you electronically).

## 2023-09-20 NOTE — Telephone Encounter (Signed)
-----   Message from Wanda VEAR Cornish sent at 09/20/2023  8:49 AM EST ----- Regarding: call Tell her cancer marker now down in normal range at 28.8.  Not cured but good sign.  When I review the Panorex from her dentist, I think I do see a thinned area of bone where she is having pain, we will stay off the Xgeva .

## 2023-09-22 ENCOUNTER — Encounter: Payer: Self-pay | Admitting: Oncology

## 2023-09-23 ENCOUNTER — Other Ambulatory Visit: Payer: Self-pay | Admitting: Hematology and Oncology

## 2023-09-30 ENCOUNTER — Other Ambulatory Visit: Payer: Self-pay | Admitting: Oncology

## 2023-09-30 DIAGNOSIS — S22078A Other fracture of T9-T10 vertebra, initial encounter for closed fracture: Secondary | ICD-10-CM

## 2023-09-30 DIAGNOSIS — M545 Low back pain, unspecified: Secondary | ICD-10-CM

## 2023-09-30 DIAGNOSIS — R0789 Other chest pain: Secondary | ICD-10-CM

## 2023-09-30 DIAGNOSIS — C7951 Secondary malignant neoplasm of bone: Secondary | ICD-10-CM

## 2023-10-02 ENCOUNTER — Other Ambulatory Visit: Payer: Self-pay

## 2023-10-04 ENCOUNTER — Inpatient Hospital Stay: Payer: 59 | Admitting: Oncology

## 2023-10-04 ENCOUNTER — Inpatient Hospital Stay: Payer: 59 | Attending: Hematology and Oncology

## 2023-10-04 ENCOUNTER — Inpatient Hospital Stay: Payer: 59

## 2023-10-04 ENCOUNTER — Other Ambulatory Visit: Payer: Self-pay | Admitting: Oncology

## 2023-10-04 ENCOUNTER — Telehealth: Payer: Self-pay | Admitting: Oncology

## 2023-10-04 ENCOUNTER — Other Ambulatory Visit: Payer: Self-pay | Admitting: Pharmacist

## 2023-10-04 VITALS — BP 92/67 | HR 108 | Temp 98.7°F | Resp 16 | Ht <= 58 in | Wt 125.6 lb

## 2023-10-04 VITALS — HR 97 | Resp 18

## 2023-10-04 DIAGNOSIS — Z170 Estrogen receptor positive status [ER+]: Secondary | ICD-10-CM

## 2023-10-04 DIAGNOSIS — Z5112 Encounter for antineoplastic immunotherapy: Secondary | ICD-10-CM | POA: Diagnosis present

## 2023-10-04 DIAGNOSIS — C50412 Malignant neoplasm of upper-outer quadrant of left female breast: Secondary | ICD-10-CM

## 2023-10-04 DIAGNOSIS — C7951 Secondary malignant neoplasm of bone: Secondary | ICD-10-CM

## 2023-10-04 DIAGNOSIS — M87180 Osteonecrosis due to drugs, jaw: Secondary | ICD-10-CM

## 2023-10-04 LAB — MAGNESIUM: Magnesium: 1.7 mg/dL (ref 1.7–2.4)

## 2023-10-04 LAB — CBC WITH DIFFERENTIAL (CANCER CENTER ONLY)
Abs Immature Granulocytes: 0.05 10*3/uL (ref 0.00–0.07)
Basophils Absolute: 0 10*3/uL (ref 0.0–0.1)
Basophils Relative: 1 %
Eosinophils Absolute: 0.2 10*3/uL (ref 0.0–0.5)
Eosinophils Relative: 4 %
HCT: 31.6 % — ABNORMAL LOW (ref 36.0–46.0)
Hemoglobin: 10.1 g/dL — ABNORMAL LOW (ref 12.0–15.0)
Immature Granulocytes: 1 %
Lymphocytes Relative: 12 %
Lymphs Abs: 0.6 10*3/uL — ABNORMAL LOW (ref 0.7–4.0)
MCH: 27.4 pg (ref 26.0–34.0)
MCHC: 32 g/dL (ref 30.0–36.0)
MCV: 85.6 fL (ref 80.0–100.0)
Monocytes Absolute: 1 10*3/uL (ref 0.1–1.0)
Monocytes Relative: 20 %
Neutro Abs: 3.1 10*3/uL (ref 1.7–7.7)
Neutrophils Relative %: 62 %
Platelet Count: 382 10*3/uL (ref 150–400)
RBC: 3.69 MIL/uL — ABNORMAL LOW (ref 3.87–5.11)
RDW: 18 % — ABNORMAL HIGH (ref 11.5–15.5)
WBC Count: 5 10*3/uL (ref 4.0–10.5)
nRBC: 0 % (ref 0.0–0.2)
nRBC: 0 /100{WBCs}

## 2023-10-04 LAB — CMP (CANCER CENTER ONLY)
ALT: 41 U/L (ref 0–44)
AST: 37 U/L (ref 15–41)
Albumin: 3.4 g/dL — ABNORMAL LOW (ref 3.5–5.0)
Alkaline Phosphatase: 188 U/L — ABNORMAL HIGH (ref 38–126)
Anion gap: 11 (ref 5–15)
BUN: 20 mg/dL (ref 6–20)
CO2: 28 mmol/L (ref 22–32)
Calcium: 9.6 mg/dL (ref 8.9–10.3)
Chloride: 99 mmol/L (ref 98–111)
Creatinine: 0.55 mg/dL (ref 0.44–1.00)
GFR, Estimated: >60 mL/min (ref >60)
Glucose, Bld: 120 mg/dL — ABNORMAL HIGH (ref 70–99)
Potassium: 4 mmol/L (ref 3.5–5.1)
Sodium: 138 mmol/L (ref 135–145)
Total Bilirubin: <0.2 mg/dL (ref 0.0–1.2)
Total Protein: 5.8 g/dL — ABNORMAL LOW (ref 6.5–8.1)

## 2023-10-04 MED ORDER — DEXTROSE 5 % IV SOLN: Freq: Once | INTRAVENOUS | Status: AC

## 2023-10-04 MED ORDER — AMOXICILLIN-POT CLAVULANATE 875-125 MG PO TABS
1.0000 | ORAL_TABLET | Freq: Two times a day (BID) | ORAL | 0 refills | Status: AC
Start: 2023-10-04 — End: 2023-10-18

## 2023-10-04 MED ORDER — SODIUM CHLORIDE 0.9 % IV SOLN
150.0000 mg | Freq: Once | INTRAVENOUS | Status: AC
  Administered 2023-10-04: 150 mg via INTRAVENOUS
  Filled 2023-10-04: qty 150

## 2023-10-04 MED ORDER — PALONOSETRON HCL INJECTION 0.25 MG/5ML
0.2500 mg | Freq: Once | INTRAVENOUS | Status: AC
  Administered 2023-10-04: 0.25 mg via INTRAVENOUS
  Filled 2023-10-04: qty 5

## 2023-10-04 MED ORDER — DIPHENHYDRAMINE HCL 25 MG PO CAPS: 50.0000 mg | ORAL_CAPSULE | Freq: Once | ORAL | Status: DC

## 2023-10-04 MED ORDER — ACETAMINOPHEN 325 MG PO TABS: 650.0000 mg | ORAL_TABLET | Freq: Once | ORAL | Status: DC

## 2023-10-04 MED ORDER — SODIUM CHLORIDE 0.9% FLUSH
10.0000 mL | INTRAVENOUS | Status: DC | PRN
Start: 2023-10-04 — End: 2023-10-04
  Administered 2023-10-04: 10 mL

## 2023-10-04 MED ORDER — FAM-TRASTUZUMAB DERUXTECAN-NXKI CHEMO 100 MG IV SOLR
5.3000 mg/kg | Freq: Once | INTRAVENOUS | Status: AC
  Administered 2023-10-04: 300 mg via INTRAVENOUS
  Filled 2023-10-04: qty 15

## 2023-10-04 MED ORDER — HEPARIN SOD (PORK) LOCK FLUSH 100 UNIT/ML IV SOLN
500.0000 [IU] | Freq: Once | INTRAVENOUS | Status: AC | PRN
  Administered 2023-10-04: 500 [IU]

## 2023-10-04 MED ORDER — DEXAMETHASONE SODIUM PHOSPHATE 10 MG/ML IJ SOLN
10.0000 mg | Freq: Once | INTRAMUSCULAR | Status: AC
  Administered 2023-10-04: 10 mg via INTRAVENOUS
  Filled 2023-10-04: qty 1

## 2023-10-04 NOTE — Progress Notes (Signed)
 [facility]  [address] [place],  [place]  [address] [phone]  Clinic Day:  10/04/23  Referring physician: Galvin Proffer, MD  ASSESSMENT & PLAN:  Assessment: Malignant neoplasm metastatic to bone [facility]) Bone metastases diagnosed in November 2022, with multiple marrow replacing lesions within the proximal right femur within the ischium as well as the inferior rami. 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 Ki67 was 60%. PET imaging in December revealed dominant finding of intensely hypermetabolic aggressive skeletal metastasis involving the calvarium, sternum, thoracic and lumbar spine, and pelvis, with potential pathologic fracture of the right femur with internal fixation. There was soft tissue extension into the anterior mediastinum associated with the manubrial metastasis. Multiple spinal vertebral body lesions had cortical destruction along the posterior margin. She started monthly Xgeva in December, and completed radiation therapy. She was receiving THP, but cycle 4 was delayed and dose reduction made by 20% due to left leg skin infection. She continued with Herceptin/Perjeta until she had progression of disease. In January 2024, she developed increased pain of the left hip. She was found to have innumerable metastatic lesions throughout the pelvis and proximal femur. There is a non displaced pathologic fracture through a large lesion in the superior left acetabulum. She was referred to an orthopedic oncologist at [facility] and they recommended radiation and no surgery. She received her palliative radiation. She followed-up with [facility] in April and they do not recommend surgery, but will continue surveillance.   We then switched her to Enhertu chemotherapy, but she has had significant toxicities of nausea, vomiting, diarrhea, and hypokalemia, but these are being addressed and  corrected,  She was also placed on hormonal therapy with Faslodex injections. CT chest, abdomen, and pelvis done on 09/05/2023 that reveals unchanged size of the mass in the left breast with overlying skin thickening, no significant interval change in the widespread osseous metastatic disease involving the axial and proximal appendicular skeleton, similar soft tissue thickening about the left subclavian structures and thoracic inlet, possibly reflecting posttreatment change. No evidence of new or progressive disease within the chest, abdomen, or pelvis was also noted.   Tremor New right hand/arm tremor of uncertain etiology, with occasional blurry vision. The tremor is less pronounced. She had an MRI brain in January, 2025 revealed multiple sclerotic bone lesions but no brain abnormalities.   Skin ulcer (HCC) Skin ulcers of the bilateral lower extremities being managed at the wound center. The patient states these are now healed.    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 She was found to be iron deficient in September 2023 and was given IV iron supplement. Repeat evaluation in November did not reveal recurrent iron deficiency. B12 was low normal, so I started her on oral B12 500 mcg daily. Folate was normal.   Hypokalemia Persistent hypokalemia.  She states she is unable to tolerate liquid potassium due to the taste nor potassium tablets due to difficulty swallowing them. She is using OTC potassium gummies, 500 mg, 2 twice daily. Her potassium is normal today, so she will continue her oral supplement.   Osteonecrosis of the Right Jaw She developed pain of the right jaw in February, 2025 and panorex x-rays confirm an abnormal area in the right lower jaw. Her dentist is working on stabilizing her dental situation and she has had 3 fillings. She has follow-up schedule  de in 2 weeks. At present she has inflammation of the chin and  right lower face so I will treat her with Augmentin for 2 weeks and monitor the situation closely. Her Rivka Barbara has been discontinued and I explained the diagnosis to her and her husband.   Plan: MRI head done on 08/20/2023 that revealed multiple sclerotic foci within the calvarium that is likely treated osseous metastasis. She also had a CT chest, abdomen, and pelvis done on 09/05/2023 that revealed no significant interval change in the widespread osseous metastatic disease. She takes about 6 tablets of Senekot to alleviate constipation and I instructed her to inform me if this stops working for her. She was discontinued off of Xgeva due to severe jaw pain and received a panorex X-ray at the dentist. I informed her that I did not receive a final report of her panorex X-ray but after reviewing film I noticed some early signs of osteonecrosis of the right lower jaw. Patient states that the jaw pain is more tolerable but this area is swollen and tender. She states that this started after receiving 3 fillings a week and a half ago and the pain has persisted. I will prescribe Augmentin 875mg  BID for 14 days. She will have additional dental work done on March 6th and this will cover her through that. Her day 1 cycle 18 of Enhertu is scheduled on 10/04/2023. She has a WBC of 5.0, low hemoglobin of 10.1 down from 10.6, and platelet count of 382,000. Her magnesium is normal at 1.7 and her CMP is normal other than a low total protein of 5.8, albumin of 3.4, and a elevated alkaline phosphatase of 188 which is a recurrent finding after being normal for many months. Her CA 27.29 done on 09/13/2023 improved significantly from 46.2 to 28.8. She is due for an echocardiogram since it has been over 3 months and I will get that scheduled. I will see her back in 3 weeks with CBC, CMP, and magnesium.  The patient understands the plans discussed today and is in agreement with them.  She knows to contact our office if she develops  concerns prior to her next appointment.  I provided 20 minutes of face-to-face time during this encounter and > 50% was spent counseling as documented under my assessment and plan.   Dellia Beckwith, MD  [facility] - A DEPT OF [facility] [address] [place] [address] Dept: [phone] Dept Fax: [phone]   No orders of the defined types were placed in this encounter.  CHIEF COMPLAINT:  CC: Metastatic hormone receptor positive breast cancer  Current Treatment: Enhertu every 3 weeks  HISTORY OF PRESENT ILLNESS:   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 (HCC)   07/23/2013 Cancer Staging   Staging form: Breast, AJCC 7th Edition - Clinical stage from 07/23/2013: Stage IIB (T2, N1, M0) - Signed by Dellia Beckwith, MD on 07/04/2020 Prognostic indicators: Pos LVI   09/22/2021 - 04/06/2022 Chemotherapy   Patient is on Treatment Plan : BREAST  Trastuzumab + Pertuzumab q21d      09/22/2021 - 09/21/2022 Chemotherapy   Patient is on Treatment Plan : BREAST Trastuzumab + Pertuzumab q21d     10/12/2022 -  Chemotherapy   Patient is on Treatment Plan : BREAST METASTATIC Fam-Trastuzumab Deruxtecan-nxki (Enhertu) (5.4) q21d  Malignant neoplasm metastatic to bone (HCC)  06/28/2021 Initial Diagnosis   Bone metastases (HCC)   09/22/2021 - 04/06/2022 Chemotherapy   Patient is on Treatment Plan : BREAST  Trastuzumab + Pertuzumab q21d      09/22/2021 - 09/21/2022 Chemotherapy   Patient is on Treatment Plan : BREAST Trastuzumab + Pertuzumab q21d     05/11/2022 Imaging   CT chest, abdomen and pelvis:  IMPRESSION:  1. Widespread bony metastatic disease shows increased sclerosis  since previous imaging. This is likely related to response to  therapy in the interval. There is a  single lesion along the LEFT  iliac which shows continued lytic change and warrants attention on  follow-up  2. Interval development of pathologic fractures at in the upper  thoracic spine and at the thoracolumbar junction with associated  kyphosis.  3. No signs of solid organ or nodal metastatic disease.  4. Marked elevation of the LEFT hemidiaphragm as on the prior PET.  5. Aortic atherosclerosis.    10/12/2022 -  Chemotherapy   Patient is on Treatment Plan : BREAST METASTATIC Fam-Trastuzumab Deruxtecan-nxki (Enhertu) (5.4) q21d       INTERVAL HISTORY:  Nailyn is here today for repeat clinical assessment for her metastatic hormone receptor positive breast cancer. MRI head done on 08/20/2023 that revealed multiple sclerotic foci within the calvarium that is likely treated osseous metastasis. She also had a CT chest, abdomen, and pelvis done on 09/05/2023 that revealed no significant interval change in the widespread osseous metastatic disease. Patient states that she feels ok but complains of constipation, nausea but improved, fatigue, and right lower jaw pain rating a 4/10. She takes about 6 tablets of Senekot to alleviate constipation and I instructed her to inform me if this stops working for her. She was discontinued off of Xgeva due to severe jaw pain and received a panorex X-ray at the dentist. I informed her that I did not receive a final report of her panorex X-ray but after reviewing film I noticed some early signs of osteonecrosis of the right lower jaw. Patient states that the jaw pain is more tolerable but this area is swollen and tender. She states that this started after receiving 3 fillings a week and a half ago and the pain has persisted. I will prescribe Augmentin 875mg  BID for 14 days. She will have additional dental work done on March 6th and this will cover her through that. Her day 1 cycle 18 of Enhertu is scheduled on 10/04/2023. She has a WBC of 5.0, low hemoglobin of 10.1 down  from 10.6, and platelet count of 382,000. Her magnesium is normal at 1.7 and her CMP is normal other than a low total protein of 5.8, albumin of 3.4, and an elevated alkaline phosphatase of 188 which is a recurrent finding after being normal for many months. Her CA 27.29 done on 09/13/2023 improved significantly from 46.2 to 28.8. She is due for an echocardiogram since it has been over 3 months and I will get that scheduled. I will see her back in 3 weeks with CBC, CMP, and magnesium.   She denies signs of infection such as sore throat, sinus drainage, cough, or urinary symptoms.  She denies fevers or recurrent chills. She denies vomiting, chest pain, dyspnea or cough. Her appetite is ok as it hurts for her to chew and her weight has decreased 3 pounds over last 3 weeks . This patient is accompanied in the office by her husband.  REVIEW OF SYSTEMS:  Review of Systems  Constitutional:  Positive for appetite change (due to jaw pain) and fatigue. Negative for chills, diaphoresis, fever and unexpected weight change.  HENT:   Negative for hearing loss, lump/mass, mouth sores, nosebleeds, sore throat, tinnitus, trouble swallowing and voice change.        Lower right jaw pain rating 4/10  Eyes:  Positive for eye problems (blurry vision). Negative for icterus.  Respiratory: Negative.  Negative for chest tightness, cough, hemoptysis, shortness of breath and wheezing.   Cardiovascular: Negative.  Negative for chest pain, leg swelling and palpitations.  Gastrointestinal:  Positive for constipation and nausea. Negative for abdominal distention, abdominal pain, blood in stool, diarrhea, rectal pain and vomiting.  Endocrine: Negative.  Negative for hot flashes.  Genitourinary: Negative.  Negative for bladder incontinence, difficulty urinating, dysuria, frequency, hematuria, menstrual problem, nocturia, pelvic pain, vaginal bleeding and vaginal discharge.   Musculoskeletal:  Positive for back pain. Negative for  arthralgias, flank pain, gait problem, myalgias, neck pain and neck stiffness.  Skin: Negative.  Negative for itching, rash and wound.  Neurological:  Positive for numbness (neuropathy bilateral hands and feet). Negative for dizziness, extremity weakness, gait problem, headaches, light-headedness, seizures and speech difficulty.  Hematological: Negative.  Negative for adenopathy. Does not bruise/bleed easily.  Psychiatric/Behavioral: Negative.  Negative for confusion, depression and sleep disturbance. The patient is not nervous/anxious.     VITALS:  Blood pressure 92/67, pulse (!) 108, temperature 98.7 F (37.1 C), temperature source Oral, resp. rate 16, height 4\' 10"  (1.473 m), weight 125 lb 9.6 oz (57 kg), last menstrual period 08/18/2013, SpO2 99%.  Wt Readings from Last 3 Encounters:  10/04/23 125 lb 9.6 oz (57 kg)  09/13/23 128 lb 3.2 oz (58.2 kg)  08/23/23 125 lb 1.6 oz (56.7 kg)    Body mass index is 26.25 kg/m.  Performance status (ECOG): 2 - Symptomatic, <50% confined to bed  PHYSICAL EXAM:  Physical Exam Vitals and nursing note reviewed.  Constitutional:      General: She is not in acute distress.    Appearance: Normal appearance. She is normal weight. She is not ill-appearing, toxic-appearing or diaphoretic.  HENT:     Head: Normocephalic and atraumatic.     Jaw: Swelling and pain on movement present.     Comments: Erythema of the chin associated with swelling and mild tenderness, also involving the right maxillary area.    Right Ear: Tympanic membrane, ear canal and external ear normal. There is no impacted cerumen.     Left Ear: Tympanic membrane, ear canal and external ear normal. There is no impacted cerumen.     Nose: Nose normal. No congestion or rhinorrhea.     Mouth/Throat:     Mouth: Mucous membranes are moist.     Pharynx: Oropharynx is clear. No oropharyngeal exudate or posterior oropharyngeal erythema.  Eyes:     General: No scleral icterus.       Right  eye: No discharge.        Left eye: No discharge.     Extraocular Movements: Extraocular movements intact.     Conjunctiva/sclera: Conjunctivae normal.     Pupils: Pupils are equal, round, and reactive to light.  Neck:     Vascular: No carotid bruit.     Comments: Limited ability to extend the neck Cardiovascular:     Rate and Rhythm: Normal rate and regular rhythm.     Pulses: Normal pulses.     Heart sounds: Normal heart sounds. No murmur  heard.    No friction rub. No gallop.  Pulmonary:     Effort: Pulmonary effort is normal. No respiratory distress.     Breath sounds: Normal breath sounds. No stridor or decreased air movement. No decreased breath sounds, wheezing, rhonchi or rales.  Chest:     Chest wall: No tenderness.     Comments: Breast exam is deferred Abdominal:     General: Bowel sounds are normal. There is no distension.     Palpations: Abdomen is soft. There is no hepatomegaly, splenomegaly or mass.     Tenderness: There is no abdominal tenderness. There is no right CVA tenderness, left CVA tenderness, guarding or rebound.     Hernia: No hernia is present.  Musculoskeletal:        General: No swelling, tenderness, deformity or signs of injury.     Cervical back: Rigidity (chronic) present. No tenderness. No spinous process tenderness or muscular tenderness. Decreased range of motion.     Right lower leg: No edema.     Left lower leg: No edema.  Lymphadenopathy:     Cervical: No cervical adenopathy.     Right cervical: No superficial, deep or posterior cervical adenopathy.    Left cervical: No superficial, deep or posterior cervical adenopathy.     Upper Body:     Right upper body: No supraclavicular, axillary or pectoral adenopathy.     Left upper body: No supraclavicular, axillary or pectoral adenopathy.  Skin:    General: Skin is warm and dry.     Coloration: Skin is not jaundiced or pale.     Findings: No bruising, erythema, lesion or rash.  Neurological:      General: No focal deficit present.     Mental Status: She is alert and oriented to person, place, and time. Mental status is at baseline.     Cranial Nerves: No cranial nerve deficit.     Sensory: No sensory deficit.     Motor: No weakness.     Coordination: Coordination normal.     Gait: Gait normal.     Deep Tendon Reflexes: Reflexes normal.  Psychiatric:        Mood and Affect: Mood normal.        Behavior: Behavior normal.        Thought Content: Thought content normal.        Judgment: Judgment normal.    LABS:      Latest Ref Rng & Units 10/04/2023    8:31 AM 09/13/2023    8:19 AM 08/23/2023    9:13 AM  CBC  WBC 4.0 - 10.5 K/uL 5.0  5.3  4.9   Hemoglobin 12.0 - 15.0 g/dL 16.1  09.6  04.5   Hematocrit 36.0 - 46.0 % 31.6  32.6  32.3   Platelets 150 - 400 K/uL 382  347  357       Latest Ref Rng & Units 10/04/2023    8:31 AM 09/13/2023    8:19 AM 08/23/2023    9:13 AM  CMP  Glucose 70 - 99 mg/dL 409  811  914   BUN 6 - 20 mg/dL 20  23  19    Creatinine 0.44 - 1.00 mg/dL 7.82  9.56  2.13   Sodium 135 - 145 mmol/L 138  139  141   Potassium 3.5 - 5.1 mmol/L 4.0  4.2  4.0   Chloride 98 - 111 mmol/L 99  101  102   CO2 22 - 32 mmol/L  28  30  30    Calcium 8.9 - 10.3 mg/dL 9.6  9.4  9.5   Total Protein 6.5 - 8.1 g/dL 5.8  5.7  5.9   Total Bilirubin 0.0 - 1.2 mg/dL <7.5  <6.4  <3.3   Alkaline Phos 38 - 126 U/L 188  105  101   AST 15 - 41 U/L 37  20  21   ALT 0 - 44 U/L 41  19  25    Lab Results  Component Value Date   CEA1 2.2 07/20/2021   /  CEA  Date Value Ref Range Status  07/20/2021 2.2 0.0 - 4.7 ng/mL Final    Comment:    (NOTE)                             Nonsmokers          <3.9                             Smokers             <5.6 [employer] Electrochemiluminescence Immunoassay (ECLIA) Values obtained with different assay methods or kits cannot be used interchangeably.  Results cannot be interpreted as absolute evidence of the presence or absence of  malignant disease. Performed At: [facility] [address] [place], [place] [id number] Jolene Schimke MD SA:[id number]    Lab Results  Component Value Date   TIBC 225 (L) 07/10/2023   TIBC 256 01/14/2023   TIBC 287 04/24/2022   FERRITIN 305 07/10/2023   FERRITIN 333 (H) 01/14/2023   FERRITIN 119 04/24/2022   IRONPCTSAT 11 07/10/2023   IRONPCTSAT 16 01/14/2023   IRONPCTSAT 9 (L) 04/24/2022   Lab Results  Component Value Date   LDH 171 07/10/2023   Component Ref Range & Units (hover) 3 wk ago (09/13/23) 3 mo ago (06/21/23) 5 mo ago (05/06/23) 5 mo ago (04/12/23) 6 mo ago (03/25/23) 7 mo ago (02/12/23) 8 mo ago (02/01/23)  Magnesium 2.0 1.8 CM 2.0 CM 1.9 CM 1.6 Low  CM 1.9 CM 1.90 R   STUDIES:  No results found.    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   Bipolar disorder (HCC) 11/29/2020   Breast cancer (HCC)    Cancer (HCC)    Cardiac murmur    Chest pain    Chronic fatigue syndrome 11/29/2020   Depression    Essential hypertension 11/29/2020   Hepatic steatosis determined by biopsy of liver 07/04/2020   HLD (hyperlipidemia)    Hypertension    Hypomagnesemia 03/26/2023   Hypothyroidism 11/29/2020   Insomnia 11/29/2020   Iron deficiency anemia 06/05/2022   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   Migraine 11/29/2020   Mild intermittent asthma 11/29/2020   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   Mixed hyperlipidemia 11/29/2020   Obesity due to excess calories    Osteopenia after menopause 07/04/2020   Other long term (current) drug therapy 11/29/2020   Other vitamin B12 deficiency anemias 11/29/2020   Personal history of malignant neoplasm of breast 11/29/2020   Pre-diabetes    Renal  cyst, acquired, right 07/04/2020   14.5 cm in October 2021  Retrocalcaneal bursitis (back of heel), left 04/22/2018   Seasonal allergic rhinitis 11/29/2020   Tightness of heel cord, left 04/22/2018   Tremor 08/02/2023   Type 2 diabetes mellitus without complications (HCC) 11/29/2020   Vitamin D deficiency 11/29/2020    Past Surgical History:  Procedure Laterality Date   CESAREAN SECTION     IR BONE TUMOR(S)RF ABLATION  05/29/2022   IR BONE TUMOR(S)RF ABLATION  05/29/2022   IR KYPHO EA ADDL LEVEL THORACIC OR LUMBAR  05/29/2022   IR KYPHO THORACIC WITH BONE BIOPSY  05/29/2022   IR RADIOLOGIST EVAL & MGMT  05/22/2022   IR RADIOLOGIST EVAL & MGMT  06/12/2022   LIVER BIOPSY  01/2019   lumpectomy  2014   Left Breast   PORT-A-CATH REMOVAL     PORTACATH PLACEMENT  08/28/2013   STOMACH SURGERY     Tummy Tuck   TOTAL HIP ARTHROPLASTY Right 07/04/2021   Procedure: TOTAL HIP ARTHROPLASTY;  Surgeon: Durene Romans, MD;  Location: [facility];  Service: Orthopedics;  Laterality: Right;  90   WISDOM TOOTH EXTRACTION      Family History  Problem Relation Age of Onset   Hyperlipidemia Mother    Diabetes Father    Stroke Father    Colon cancer Neg Hx    Colon polyps Neg Hx    Esophageal cancer Neg Hx    Rectal cancer Neg Hx    Stomach cancer Neg Hx     Social History:  reports that she quit smoking about 21 years ago. Her smoking use included cigarettes. She has never used smokeless tobacco. She reports current alcohol use. She reports that she does not use drugs.The patient is accompanied by her husband today.  Allergies:  Allergies  Allergen Reactions   Reglan [Metoclopramide]     Made patient feel really funny when taking this medication    Xgeva [Denosumab] Other (See Comments)    Possible ONJ (09/13/23)    Current Medications: Current Outpatient Medications  Medication Sig Dispense Refill   albuterol (VENTOLIN HFA) 108 (90 Base) MCG/ACT inhaler Inhale 2 puffs into the lungs  every 6 (six) hours as needed for shortness of breath.     amoxicillin (AMOXIL) 500 MG capsule TAKE 4 CAPSULES BY MOUTH ONCE FOR ONE DOSE ONE HOUR PRIOR TO DENTAL PROCEDURE 4 capsule 0   amoxicillin-clavulanate (AUGMENTIN) 875-125 MG tablet Take 1 tablet by mouth 2 (two) times daily for 14 days. 28 tablet 0   ARIPiprazole (ABILIFY) 5 MG tablet Take 2.5 mg by mouth daily.     aspirin 81 MG EC tablet Take 81 mg by mouth daily. Swallow whole.     atorvastatin (LIPITOR) 20 MG tablet TAKE ONE TABLET BY MOUTH EVERY DAY 90 tablet 1   augmented betamethasone dipropionate (DIPROLENE-AF) 0.05 % cream      dapagliflozin propanediol (FARXIGA) 10 MG TABS tablet TAKE ONE TABLET BY MOUTH EVERY DAY 30 tablet 10   desvenlafaxine (PRISTIQ) 50 MG 24 hr tablet Take 50 mg by mouth daily.     dexamethasone (DECADRON) 4 MG tablet TAKE ONE TABLET BY MOUTH 2 TIMES A DAY FOR 5 DAYS AFTER CHEMOTHERAPY 20 tablet 2   diphenoxylate-atropine (LOMOTIL) 2.5-0.025 MG tablet TAKE TWO TABLETS BY MOUTH FOUR TIMES A DAY AS NEEDED FOR DIARRHEA OR LOOSE STOOLS 100 tablet 1   labetalol (NORMODYNE) 100 MG tablet Take 1 tablet (100 mg total) by mouth 2 (two) times daily. 180 tablet 3   loratadine (CLARITIN) 10 MG tablet TAKE ONE TABLET  BY MOUTH DAILY 30 tablet 2   LORazepam (ATIVAN) 1 MG tablet Take 1 tablet (1 mg total) by mouth every 8 (eight) hours. 30 tablet 1   magic mouthwash (lidocaine, diphenhydrAMINE, alum & mag hydroxide) suspension Take 5 mLs by mouth every 3 (three) hours as needed. Swish and spit; for throat/mouth pain     naloxone (NARCAN) nasal spray 4 mg/0.1 mL One spray in nostril as needed, may repeat every 2 to 3 minutes until medical assistance becomes available 4 each 1   nitroGLYCERIN (NITROSTAT) 0.4 MG SL tablet Place 0.4 mg under the tongue every 5 (five) minutes as needed for chest pain.     nystatin (MYCOSTATIN/NYSTOP) powder Apply 1 Application topically 2 (two) times daily.     omeprazole (PRILOSEC) 40 MG capsule  Take 1 capsule (40 mg total) by mouth at bedtime. 30 capsule 5   ondansetron (ZOFRAN) 8 MG tablet Take 1 tablet (8 mg total) by mouth every 8 (eight) hours as needed for nausea or vomiting. 60 tablet 5   Oxycodone HCl 10 MG TABS TAKE ONE TABLET BY MOUTH EVERY 4 HOURS AS NEEDED FOR PAIN 180 tablet 0   potassium chloride SA (KLOR-CON M) 20 MEQ tablet Take 1 tablet (20 mEq total) by mouth 2 (two) times daily. (Patient taking differently: Take 20 mEq by mouth 2 (two) times daily. Taking potasium citrate 1000 mg per two gummies) 60 tablet 1   prochlorperazine (COMPAZINE) 10 MG tablet TAKE ONE TABLET BY MOUTH EVERY 6 HOURS AS NEEDED FOR NAUSEA/VOMITING 90 tablet 3   Current Facility-Administered Medications  Medication Dose Route Frequency Provider Last Rate Last Admin   technetium sestamibi generic (CARDIOLITE) injection 10.5 millicurie  10.5 millicurie Intravenous Once PRN Revankar, Aundra Dubin, MD        I,Jasmine M Lassiter,acting as a scribe for Dellia Beckwith, MD.,have documented all relevant documentation on the behalf of Dellia Beckwith, MD,as directed by  Dellia Beckwith, MD while in the presence of Dellia Beckwith, MD.

## 2023-10-04 NOTE — Progress Notes (Signed)
Round dose of fam-trastuzumab down to 300 mg for this cycle due to weight loss per Dr. Gilman Buttner.

## 2023-10-04 NOTE — Telephone Encounter (Signed)
Patient has been scheduled for follow-up visit per 10/04/23 LOS.  Pt given an appt calendar with date and time.

## 2023-10-04 NOTE — Patient Instructions (Signed)
 Fam-Trastuzumab Deruxtecan Injection What is this medication? FAM-TRASTUZUMAB DERUXTECAN (fam-tras TOOZ eu mab DER ux TEE kan) treats some types of cancer. It works by blocking a protein that causes cancer cells to grow and multiply. This helps to slow or stop the spread of cancer cells. This medicine may be used for other purposes; ask your health care provider or pharmacist if you have questions. COMMON BRAND NAME(S): ENHERTU What should I tell my care team before I take this medication? They need to know if you have any of these conditions: Heart disease Heart failure Infection, especially a viral infection, such as chickenpox, cold sores, or herpes Liver disease Lung or breathing disease, such as asthma or COPD An unusual or allergic reaction to fam-trastuzumab deruxtecan, other medications, foods, dyes, or preservatives Pregnant or trying to get pregnant Breast-feeding How should I use this medication? This medication is injected into a vein. It is given by your care team in a hospital or clinic setting. A special MedGuide will be given to you before each treatment. Be sure to read this information carefully each time. Talk to your care team about the use of this medication in children. Special care may be needed. Overdosage: If you think you have taken too much of this medicine contact a poison control center or emergency room at once. NOTE: This medicine is only for you. Do not share this medicine with others. What if I miss a dose? It is important not to miss your dose. Call your care team if you are unable to keep an appointment. What may interact with this medication? Interactions are not expected. This list may not describe all possible interactions. Give your health care provider a list of all the medicines, herbs, non-prescription drugs, or dietary supplements you use. Also tell them if you smoke, drink alcohol, or use illegal drugs. Some items may interact with your  medicine. What should I watch for while using this medication? Visit your care team for regular checks on your progress. Tell your care team if your symptoms do not start to get better or if they get worse. This medication may increase your risk of getting an infection. Call your care team for advice if you get a fever, chills, sore throat, or other symptoms of a cold or flu. Do not treat yourself. Try to avoid being around people who are sick. Avoid taking medications that contain aspirin, acetaminophen, ibuprofen, naproxen, or ketoprofen unless instructed by your care team. These medications may hide a fever. Be careful brushing or flossing your teeth or using a toothpick because you may get an infection or bleed more easily. If you have any dental work done, tell your dentist you are receiving this medication. This medication may cause dry eyes and blurred vision. If you wear contact lenses, you may feel some discomfort. Lubricating eye drops may help. See your care team if the problem does not go away or is severe. Talk to your care team if you may be pregnant. Serious birth defects can occur if you take this medication during pregnancy and for 7 months after the last dose. If your partner can get pregnant, use a condom during sex while taking this medication and for 4 months after the last dose. Do not breastfeed while taking this medication and for 7 months after the last dose. This medication may cause infertility. Talk to your care team if you are concerned about your fertility. What side effects may I notice from receiving this medication? Side effects  that you should report to your care team as soon as possible: Allergic reactions--skin rash, itching, hives, swelling of the face, lips, tongue, or throat Dry cough, shortness of breath or trouble breathing Infection--fever, chills, cough, sore throat, wounds that don't heal, pain or trouble when passing urine, general feeling of discomfort or  being unwell Heart failure--shortness of breath, swelling of the ankles, feet, or hands, sudden weight gain, unusual weakness or fatigue Unusual bruising or bleeding Side effects that usually do not require medical attention (report these to your care team if they continue or are bothersome): Constipation Diarrhea Hair loss Muscle pain Nausea Vomiting This list may not describe all possible side effects. Call your doctor for medical advice about side effects. You may report side effects to FDA at 1-800-FDA-1088. Where should I keep my medication? This medication is given in a hospital or clinic. It will not be stored at home. NOTE: This sheet is a summary. It may not cover all possible information. If you have questions about this medicine, talk to your doctor, pharmacist, or health care provider.  2024 [employer] (2023-03-29 00:00:00)

## 2023-10-09 ENCOUNTER — Ambulatory Visit: Payer: 59

## 2023-10-11 ENCOUNTER — Encounter: Payer: Self-pay | Admitting: Oncology

## 2023-10-19 ENCOUNTER — Other Ambulatory Visit: Payer: Self-pay | Admitting: Hematology and Oncology

## 2023-10-24 NOTE — Progress Notes (Signed)
 [facility]  [address] [place],  [place]  [address] [phone]  Clinic Day:  10/25/23  Referring physician: Galvin Proffer, MD  ASSESSMENT & PLAN:  Assessment: Malignant neoplasm metastatic to bone [facility]) Bone metastases diagnosed in November 2022, with multiple marrow replacing lesions within the proximal right femur within the ischium as well as the inferior rami. 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 Ki67 was 60%. PET imaging in December revealed dominant finding of intensely hypermetabolic aggressive skeletal metastasis involving the calvarium, sternum, thoracic and lumbar spine, and pelvis, with potential pathologic fracture of the right femur with internal fixation. There was soft tissue extension into the anterior mediastinum associated with the manubrial metastasis. Multiple spinal vertebral body lesions had cortical destruction along the posterior margin. She started monthly Xgeva in December, and completed radiation therapy. She was receiving THP, but cycle 4 was delayed and dose reduction made by 20% due to left leg skin infection. She continued with Herceptin/Perjeta until she had progression of disease. In January 2024, she developed increased pain of the left hip. She was found to have innumerable metastatic lesions throughout the pelvis and proximal femur. There is a non displaced pathologic fracture through a large lesion in the superior left acetabulum. She was referred to an orthopedic oncologist at [facility] and they recommended radiation and no surgery. She received her palliative radiation. She followed-up with [facility] in April and they do not recommend surgery, but will continue surveillance.   We then switched her to Enhertu chemotherapy, but she has had significant toxicities of nausea, vomiting, diarrhea, and hypokalemia, but these are being addressed and  corrected,  She was also placed on hormonal therapy with Faslodex injections. CT chest, abdomen, and pelvis done on 09/05/2023 that reveals unchanged size of the mass in the left breast with overlying skin thickening, no significant interval change in the widespread osseous metastatic disease involving the axial and proximal appendicular skeleton, similar soft tissue thickening about the left subclavian structures and thoracic inlet, reflecting posttreatment change. No evidence of new or progressive disease within the chest, abdomen, or pelvis was also noted. Her CA 27.29 has been steadily decreasing, from 106.6 originally to 53, to 28.8 on 09/13/2023.  Tremor New right hand/arm tremor of uncertain etiology, with occasional blurry vision. The tremor is less pronounced. She had an MRI brain in January, 2025 which revealed multiple sclerotic bone lesions but no brain abnormalities.   Skin ulcer (HCC) Skin ulcers of the bilateral lower extremities being managed at the wound center. The patient states these are now healed.    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 She was found to be iron deficient in September 2023 and was given IV iron supplement. Repeat evaluation in November 2024 did not reveal recurrent iron deficiency. B12 was low normal, so I started her on oral B12 500 mcg daily. Folate was normal. Now she has a severe acute drop in her hemoglobin from 10 to 6 over the last 3 weeks so I am evaluating for iron deficiency or hemolysis. We will arrange for transfusion of 2 units of PRBC's. She is once again iron deficient so she will receive IV iron. We will keep Enhertu on hold until this is evaluated.    Osteonecrosis of the Right Jaw She developed pain of the right jaw in February,  2025 and panorex x-rays confirm an abnormal area in the right lower jaw. Her dentist is working on stabilizing her dental situation and  she has had 3 fillings. She has follow-up schedule de in 2 weeks. At present she has inflammation of the chin and right lower face so I will treat her with Augmentin for 2 weeks and monitor the situation closely. Her Rivka Barbara has been discontinued and I explained the diagnosis to her and her husband.   Plan: I wrote down a bowel regimen for her constipation such as milk of magnesia, dulcolax suppository, magnesium citrate, or an enema. If all else fails I can prescribe lactulose. She has an echocardiogram scheduled on 11/05/2023. She has a WBC of 5.4, low hemoglobin of 6.4 down from 10.1 3 weeks ago, and a platelet count of 340,000. This is a significant drop in her hemoglobin as this normally ranges in the 10's. I will order 2 units of PRBC's and arrange for transfusion today. I repeated the CBC and confirmed the hemoglobin to be 6.0. I also drew haptoglobin, reticulocyte count, LDH, to rule out hemolysis and B-12, folate, and iron studies. Other possible etiologies would be blood loss but she has no overt bleeding. It could be myelophthisic or marrow aplasia, but I would not expect the white count and platelet count to be normal. She has never been transfused before, and I explained to her the process and she showed understanding. She endorses having clay colored stools instead of black or melena. Her CMP is normal other than a low total protein of 5.2 and albumin of 3.4. Her day 1 cycle 19 of Enhertu was scheduled for today but we will hold this cycle until her counts improve and we may consider stopping it all together, but her last scan was stable. She informed me that she had the 2nd portion of her dental work done in the last 3 weeks and was found to have 13 cavities. She had a couple of fillings done and will need more in the future, as it has been some years since she has been to the dentist. I will see her back next week with CBC, CMP, CA 27.29, and type and cross. We will plan weekly follow-up and labs  until this situation has stabilized. The patient understands the plans discussed today and is in agreement with them.  She knows to contact our office if she develops concerns prior to her next appointment.  I provided 45 minutes of face-to-face time during this encounter and > 50% was spent counseling as documented under my assessment and plan.   Dellia Beckwith, MD  [facility] - [facility] DEPT [facility] [address] [place] [address] Dept: [phone] Dept Fax: [phone]   Orders Placed This Encounter  Procedures   Type and screen         Standing Status:   Future    Number of Occurrences:   1    Expiration Date:   10/24/2024   Prepare RBC (crossmatch)    Standing Status:   Standing    Number of Occurrences:   1    # of Units:   2 units    Transfusion Indications:   Hemoglobin < 7 gm/dL and symptomatic    Number of Units to Keep Ahead:   NO units ahead    If emergent release call blood bank:   [facility] [phone]   CHIEF COMPLAINT:  CC:  Metastatic hormone receptor positive breast cancer  Current Treatment: Enhertu every 3 weeks  HISTORY OF PRESENT ILLNESS:   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 (HCC)   07/23/2013 Cancer Staging   Staging form: Breast, AJCC 7th Edition - Clinical stage from 07/23/2013: Stage IIB (T2, N1, M0) - Signed by Dellia Beckwith, MD on 07/04/2020 Prognostic indicators: Pos LVI   09/22/2021 - 04/06/2022 Chemotherapy   Patient is on Treatment Plan : BREAST  Trastuzumab + Pertuzumab q21d      09/22/2021 - 09/21/2022 Chemotherapy   Patient is on Treatment Plan : BREAST Trastuzumab + Pertuzumab q21d     10/12/2022 -  Chemotherapy   Patient is on Treatment Plan : BREAST METASTATIC Fam-Trastuzumab Deruxtecan-nxki (Enhertu) (5.4) q21d      Malignant neoplasm metastatic to bone (HCC)  06/28/2021 Initial Diagnosis   Bone metastases (HCC)   09/22/2021 - 04/06/2022 Chemotherapy   Patient is on Treatment Plan : BREAST  Trastuzumab + Pertuzumab q21d      09/22/2021 - 09/21/2022 Chemotherapy   Patient is on Treatment Plan : BREAST Trastuzumab + Pertuzumab q21d     05/11/2022 Imaging   CT chest, abdomen and pelvis:  IMPRESSION:  1. Widespread bony metastatic disease shows increased sclerosis  since previous imaging. This is likely related to response to  therapy in the interval. There is a single lesion along the LEFT  iliac which shows continued lytic change and warrants attention on  follow-up  2. Interval development of pathologic fractures at in the upper  thoracic spine and at the thoracolumbar junction with associated  kyphosis.  3. No signs of solid organ or nodal metastatic disease.  4. Marked elevation of the LEFT hemidiaphragm as on the prior PET.  5. Aortic atherosclerosis.    10/12/2022 -  Chemotherapy   Patient is on Treatment Plan : BREAST METASTATIC Fam-Trastuzumab Deruxtecan-nxki (Enhertu) (5.4) q21d       INTERVAL HISTORY:  Esta is here today for repeat clinical assessment for her metastatic hormone receptor positive breast cancer. MRI head done on 08/20/2023 that revealed multiple sclerotic foci within the calvarium that is likely treated osseous metastasis. She also had a CT chest, abdomen, and pelvis done on 09/05/2023 that revealed no significant interval change in the widespread osseous metastatic disease. Patient states that she feels severely fatigued and complains of buttock pain rating 5/10, abdominal pain, and constipation. She has been taking 6 Senokot S tablets daily with no relief, with her longest time without a bowel movement being 5 days. I wrote down a bowel regimen for her and her husband such as milk of magnesia, dulcolax suppository, magnesium citrate, or an enema. If all else fails I can  prescribe lactulose. She has an echocardiogram scheduled on 11/05/2023. She has a WBC of 5.4, low hemoglobin of 6.4 down from 10.1 3 weeks ago, and a platelet count of 340,000. This is a significant drop in her hemoglobin as this normally ranges in the 10's.  I will order 2 units of PRBC's and arrange for transfusion today. I repeated the CBC and confirmed the hemoglobin to be 6.0. I also drew haptoglobin, reticulocyte count, LDH, to rule out hemolysis and B-12, folate, and iron studies. Other possible etiologies would be blood loss but she has no overt bleeding. It could be myelophthisic or marrow aplasia, but I would not expect the white count  and platelet count to be normal. She has never been transfused before, and I explained to her the process and she showed understanding. She endorses having clay colored stools instead of black or melena. Her CMP is normal other than a low total protein of 5.2 and albumin of 3.4. Her day 1 cycle 19 of Enhertu was scheduled for today but we will hold this cycle until her counts improve and we may consider stopping it all together, but her last scan was stable. She informed me that she had the 2nd portion of her dental work done in the last 3 weeks and was found to have 13 cavities. She had a couple of fillings done and will need more in the future, as it has been some years since she has been to the dentist. I will see her back next week with CBC, CMP, CA 27.29, and type and cross. We will plan weekly follow-up and labs until this situation has stabilized.   She denies signs of infection such as sore throat, sinus drainage, cough, or urinary symptoms.  She denies fevers or recurrent chills. She denies nausea, vomiting, chest pain, dyspnea or cough. Her appetite is 50% and her weight has decreased 3 pounds over last 3 weeks . This patient is accompanied in the office by her husband  REVIEW OF SYSTEMS:  Review of Systems  Constitutional:  Positive for appetite change (due  to jaw pain) and fatigue. Negative for chills, diaphoresis, fever and unexpected weight change.  HENT:   Negative for hearing loss, lump/mass, mouth sores, nosebleeds, sore throat, tinnitus, trouble swallowing and voice change.        Lower right jaw pain rating 4/10  Eyes:  Positive for eye problems (blurry vision). Negative for icterus.  Respiratory: Negative.  Negative for chest tightness, cough, hemoptysis, shortness of breath and wheezing.   Cardiovascular: Negative.  Negative for chest pain, leg swelling and palpitations.  Gastrointestinal:  Positive for abdominal pain, constipation and nausea. Negative for abdominal distention, blood in stool, diarrhea, rectal pain and vomiting.  Endocrine: Negative.  Negative for hot flashes.  Genitourinary: Negative.  Negative for bladder incontinence, difficulty urinating, dyspareunia, dysuria, frequency, hematuria, menstrual problem, nocturia, pelvic pain, vaginal bleeding and vaginal discharge.   Musculoskeletal:  Positive for back pain. Negative for arthralgias, flank pain, gait problem, myalgias, neck pain and neck stiffness.       Buttock pain, 5/10  Skin: Negative.  Negative for itching, rash and wound.  Neurological:  Positive for numbness (neuropathy bilateral hands and feet). Negative for dizziness, extremity weakness, gait problem, headaches, light-headedness, seizures and speech difficulty.  Hematological: Negative.  Negative for adenopathy. Does not bruise/bleed easily.  Psychiatric/Behavioral: Negative.  Negative for confusion, decreased concentration, depression, sleep disturbance and suicidal ideas. The patient is not nervous/anxious.     VITALS:  Blood pressure (!) 78/50, pulse 97, temperature 98.1 F (36.7 C), temperature source Oral, resp. rate 18, height 4\' 10"  (1.473 m), weight 122 lb 9.6 oz (55.6 kg), last menstrual period 08/18/2013, SpO2 100%.  Wt Readings from Last 3 Encounters:  11/01/23 120 lb 4 oz (54.5 kg)  10/29/23 120 lb  6.4 oz (54.6 kg)  10/25/23 122 lb 9.6 oz (55.6 kg)    Body mass index is 25.62 kg/m.  Performance status (ECOG): 2 - Symptomatic, <50% confined to bed  PHYSICAL EXAM:  Physical Exam Vitals and nursing note reviewed. Exam conducted with a chaperone present.  Constitutional:      General: She is not in  acute distress.    Appearance: Normal appearance. She is normal weight. She is not ill-appearing, toxic-appearing or diaphoretic.  HENT:     Head: Normocephalic and atraumatic.     Jaw: No swelling or pain on movement.     Right Ear: Tympanic membrane, ear canal and external ear normal. There is no impacted cerumen.     Left Ear: Tympanic membrane, ear canal and external ear normal. There is no impacted cerumen.     Nose: Nose normal. No congestion or rhinorrhea.     Mouth/Throat:     Mouth: Mucous membranes are moist.     Pharynx: Oropharynx is clear. No oropharyngeal exudate or posterior oropharyngeal erythema.  Eyes:     General: No scleral icterus.       Right eye: No discharge.        Left eye: No discharge.     Extraocular Movements: Extraocular movements intact.     Conjunctiva/sclera: Conjunctivae normal.     Pupils: Pupils are equal, round, and reactive to light.  Neck:     Vascular: No carotid bruit.     Comments: Limited ability to extend the neck Cardiovascular:     Rate and Rhythm: Normal rate and regular rhythm.     Pulses: Normal pulses.     Heart sounds: Normal heart sounds. No murmur heard.    No friction rub. No gallop.  Pulmonary:     Effort: Pulmonary effort is normal. No respiratory distress.     Breath sounds: Normal breath sounds. No stridor or decreased air movement. No decreased breath sounds, wheezing, rhonchi or rales.  Chest:     Chest wall: No tenderness.     Comments: Breast exam is deferred Abdominal:     General: Bowel sounds are normal. There is no distension.     Palpations: Abdomen is soft. There is no hepatomegaly, splenomegaly or mass.      Tenderness: There is no abdominal tenderness. There is no right CVA tenderness, left CVA tenderness, guarding or rebound.     Hernia: No hernia is present.  Musculoskeletal:        General: No swelling, tenderness, deformity or signs of injury.     Cervical back: Rigidity (chronic) present. No tenderness. No spinous process tenderness or muscular tenderness. Decreased range of motion.     Right lower leg: No edema.     Left lower leg: No edema.  Lymphadenopathy:     Cervical: No cervical adenopathy.     Right cervical: No superficial, deep or posterior cervical adenopathy.    Left cervical: No superficial, deep or posterior cervical adenopathy.     Upper Body:     Right upper body: No supraclavicular, axillary or pectoral adenopathy.     Left upper body: No supraclavicular, axillary or pectoral adenopathy.  Skin:    General: Skin is warm and dry.     Coloration: Skin is not jaundiced or pale.     Findings: No bruising, erythema, lesion or rash.  Neurological:     General: No focal deficit present.     Mental Status: She is alert and oriented to person, place, and time. Mental status is at baseline.     Cranial Nerves: No cranial nerve deficit.     Sensory: No sensory deficit.     Motor: No weakness.     Coordination: Coordination normal.     Gait: Gait normal.     Deep Tendon Reflexes: Reflexes normal.  Psychiatric:  Mood and Affect: Mood normal.        Behavior: Behavior normal.        Thought Content: Thought content normal.        Judgment: Judgment normal.    LABS:      Latest Ref Rng & Units 10/29/2023    2:04 PM 10/25/2023    9:03 AM 10/25/2023    8:12 AM  CBC  WBC 4.0 - 10.5 K/uL 6.9  5.2  5.4   Hemoglobin 12.0 - 15.0 g/dL 09.8  6.0  6.4   Hematocrit 36.0 - 46.0 % 34.1  20.7  21.5   Platelets 150 - 400 K/uL 403  377  340       Latest Ref Rng & Units 10/29/2023    2:04 PM 10/25/2023    8:12 AM 10/04/2023    8:31 AM  CMP  Glucose 70 - 99 mg/dL 119  147  829    BUN 6 - 20 mg/dL 11  15  20    Creatinine 0.44 - 1.00 mg/dL 5.62  1.30  8.65   Sodium 135 - 145 mmol/L 140  140  138   Potassium 3.5 - 5.1 mmol/L 3.9  4.0  4.0   Chloride 98 - 111 mmol/L 103  101  99   CO2 22 - 32 mmol/L 26  31  28    Calcium 8.9 - 10.3 mg/dL 8.9  9.1  9.6   Total Protein 6.5 - 8.1 g/dL 5.6  5.2  5.8   Total Bilirubin 0.0 - 1.2 mg/dL 0.3  <7.8  <4.6   Alkaline Phos 38 - 126 U/L 96  75  188   AST 15 - 41 U/L 20  19  37   ALT 0 - 44 U/L 14  13  41    Lab Results  Component Value Date   CEA1 2.2 07/20/2021   /  CEA  Date Value Ref Range Status  07/20/2021 2.2 0.0 - 4.7 ng/mL Final    Comment:    (NOTE)                             Nonsmokers          <3.9                             Smokers             <5.6 [employer] Electrochemiluminescence Immunoassay (ECLIA) Values obtained with different assay methods or kits cannot be used interchangeably.  Results cannot be interpreted as absolute evidence of the presence or absence of malignant disease. Performed At: [facility] [address] [place], [place] [id number] Jolene Schimke MD LK:[id number]    Lab Results  Component Value Date   TIBC 251 10/25/2023   TIBC 225 (L) 07/10/2023   TIBC 256 01/14/2023   FERRITIN 129 10/25/2023   FERRITIN 305 07/10/2023   FERRITIN 333 (H) 01/14/2023   IRONPCTSAT 8 (L) 10/25/2023   IRONPCTSAT 11 07/10/2023   IRONPCTSAT 16 01/14/2023   Lab Results  Component Value Date   LDH 143 10/25/2023   LDH 171 07/10/2023   omponent Ref Range & Units (hover) 1 mo ago (09/13/23) 11 mo ago (11/21/22) 1 yr ago (10/09/22) 1 yr ago (08/29/22) 1 yr ago (06/28/22) 2 yr ago (09/06/21) 2 yr ago (07/20/21)  CA 27.29 28.8 46.2 High  CM 53.8 High  CM 57.7 High  CM 50.9 High  CM 106.6 High  CM 75.6 High     Component Ref Range & Units (hover) 3 wk ago (09/13/23) 3 mo ago (06/21/23) 5 mo ago (05/06/23) 5 mo ago (04/12/23) 6 mo ago (03/25/23) 7 mo ago (02/12/23) 8 mo  ago (02/01/23)  Magnesium 2.0 1.8 CM 2.0 CM 1.9 CM 1.6 Low  CM 1.9 CM 1.90 R   STUDIES:  EXAM: 09/05/2023 CT CHEST, ABDOMEN, AND PELVIS WITH CONTRAST IMPRESSION: 1. Unchanged size of the mass in the left breast with overlying skin thickening. 2. No significant interval change in the widespread osseous metastatic disease involving the axial and proximal appendicular skeleton. 3. Similar soft tissue thickening about the left subclavian structures and thoracic inlet, possibly reflecting posttreatment change. Suggest continued attention on follow-up imaging. 4. No evidence of new or progressive disease within the chest, abdomen or pelvis. 5.  Aortic Atherosclerosis (ICD10-I70.0).     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   Bipolar disorder (HCC) 11/29/2020   Breast cancer (HCC)    Cancer (HCC)    Cardiac murmur    Chest pain    Chronic fatigue syndrome 11/29/2020   Depression    Essential hypertension 11/29/2020   Hepatic steatosis determined by biopsy of liver 07/04/2020   HLD (hyperlipidemia)    Hypertension    Hypomagnesemia 03/26/2023   Hypothyroidism 11/29/2020   Insomnia 11/29/2020   Iron deficiency anemia 06/05/2022   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   Migraine 11/29/2020   Mild intermittent asthma 11/29/2020   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   Mixed hyperlipidemia 11/29/2020   Obesity due to excess calories    Osteopenia after menopause 07/04/2020   Other long term (current) drug therapy 11/29/2020   Other vitamin B12 deficiency anemias 11/29/2020   Personal history of malignant neoplasm of breast 11/29/2020   Pre-diabetes    Renal cyst, acquired, right 07/04/2020   14.5 cm in  October 2021   Retrocalcaneal bursitis (back of heel), left 04/22/2018   Seasonal allergic rhinitis 11/29/2020   Tightness of heel cord, left 04/22/2018   Tremor 08/02/2023   Type 2 diabetes mellitus without complications (HCC) 11/29/2020   Vitamin D deficiency 11/29/2020    Past Surgical History:  Procedure Laterality Date   CESAREAN SECTION     IR BONE TUMOR(S)RF ABLATION  05/29/2022   IR BONE TUMOR(S)RF ABLATION  05/29/2022   IR KYPHO EA ADDL LEVEL THORACIC OR LUMBAR  05/29/2022   IR KYPHO THORACIC WITH BONE BIOPSY  05/29/2022   IR RADIOLOGIST EVAL & MGMT  05/22/2022   IR RADIOLOGIST EVAL & MGMT  06/12/2022   LIVER BIOPSY  01/2019   lumpectomy  2014   Left Breast   PORT-A-CATH REMOVAL     PORTACATH PLACEMENT  08/28/2013   STOMACH SURGERY     Tummy Tuck   TOTAL HIP ARTHROPLASTY Right 07/04/2021   Procedure: TOTAL HIP ARTHROPLASTY;  Surgeon: Durene Romans, MD;  Location: [facility];  Service: Orthopedics;  Laterality: Right;  90   WISDOM TOOTH EXTRACTION      Family History  Problem Relation Age of Onset   Hyperlipidemia Mother    Diabetes Father    Stroke Father    Colon cancer Neg Hx  Colon polyps Neg Hx    Esophageal cancer Neg Hx    Rectal cancer Neg Hx    Stomach cancer Neg Hx     Social History:  reports that she quit smoking about 21 years ago. Her smoking use included cigarettes. She has never used smokeless tobacco. She reports current alcohol use. She reports that she does not use drugs.The patient is accompanied by her husband today.  Allergies:  Allergies  Allergen Reactions   Reglan [Metoclopramide]     Made patient feel really funny when taking this medication    Xgeva [Denosumab] Other (See Comments)    Possible ONJ (09/13/23)    Current Medications: Current Outpatient Medications  Medication Sig Dispense Refill   albuterol (VENTOLIN HFA) 108 (90 Base) MCG/ACT inhaler Inhale 2 puffs into the lungs every 6 (six) hours as needed for shortness of  breath.     amoxicillin (AMOXIL) 500 MG capsule Take 1 capsule (500 mg total) by mouth in the morning, at noon, in the evening, and at bedtime. 40 capsule 0   ARIPiprazole (ABILIFY) 5 MG tablet Take 2.5 mg by mouth daily.     aspirin 81 MG EC tablet Take 81 mg by mouth daily. Swallow whole.     atorvastatin (LIPITOR) 20 MG tablet TAKE ONE TABLET BY MOUTH EVERY DAY 90 tablet 1   augmented betamethasone dipropionate (DIPROLENE-AF) 0.05 % cream      dapagliflozin propanediol (FARXIGA) 10 MG TABS tablet TAKE ONE TABLET BY MOUTH EVERY DAY 30 tablet 10   desvenlafaxine (PRISTIQ) 50 MG 24 hr tablet Take 50 mg by mouth daily.     dexamethasone (DECADRON) 4 MG tablet TAKE ONE TABLET BY MOUTH 2 TIMES A DAY FOR 5 DAYS AFTER CHEMOTHERAPY 20 tablet 2   diphenoxylate-atropine (LOMOTIL) 2.5-0.025 MG tablet TAKE TWO TABLETS BY MOUTH FOUR TIMES A DAY AS NEEDED FOR DIARRHEA OR LOOSE STOOLS 100 tablet 1   folic acid (FOLVITE) 1 MG tablet Take 1 tablet (1 mg total) by mouth daily. 30 tablet 5   labetalol (NORMODYNE) 100 MG tablet Take 1 tablet (100 mg total) by mouth 2 (two) times daily. 180 tablet 3   loratadine (CLARITIN) 10 MG tablet TAKE ONE TABLET BY MOUTH EVERY DAY 30 tablet 2   LORazepam (ATIVAN) 1 MG tablet Take 1 tablet (1 mg total) by mouth every 8 (eight) hours. 30 tablet 1   magic mouthwash (lidocaine, diphenhydrAMINE, alum & mag hydroxide) suspension Take 5 mLs by mouth every 3 (three) hours as needed. Swish and spit; for throat/mouth pain     naloxone (NARCAN) nasal spray 4 mg/0.1 mL One spray in nostril as needed, may repeat every 2 to 3 minutes until medical assistance becomes available 4 each 1   nitroGLYCERIN (NITROSTAT) 0.4 MG SL tablet Place 0.4 mg under the tongue every 5 (five) minutes as needed for chest pain.     nystatin (MYCOSTATIN/NYSTOP) powder Apply 1 Application topically 2 (two) times daily.     omeprazole (PRILOSEC) 40 MG capsule Take 1 capsule (40 mg total) by mouth at bedtime. 30  capsule 5   ondansetron (ZOFRAN) 8 MG tablet Take 1 tablet (8 mg total) by mouth every 8 (eight) hours as needed for nausea or vomiting. 60 tablet 5   Oxycodone HCl 10 MG TABS TAKE ONE TABLET BY MOUTH EVERY 4 HOURS AS NEEDED FOR PAIN 180 tablet 0   potassium chloride SA (KLOR-CON M) 20 MEQ tablet Take 1 tablet (20 mEq total) by mouth 2 (two)  times daily. (Patient taking differently: Take 20 mEq by mouth 2 (two) times daily. Taking potasium citrate 1000 mg per two gummies) 60 tablet 1   prochlorperazine (COMPAZINE) 10 MG tablet TAKE ONE TABLET BY MOUTH EVERY 6 HOURS AS NEEDED FOR NAUSEA/VOMITING 90 tablet 3   Current Facility-Administered Medications  Medication Dose Route Frequency Provider Last Rate Last Admin   technetium sestamibi generic (CARDIOLITE) injection 10.5 millicurie  10.5 millicurie Intravenous Once PRN Revankar, Aundra Dubin, MD        I,Jasmine M Lassiter,acting as a scribe for Dellia Beckwith, MD.,have documented all relevant documentation on the behalf of Dellia Beckwith, MD,as directed by  Dellia Beckwith, MD while in the presence of Dellia Beckwith, MD.

## 2023-10-25 ENCOUNTER — Other Ambulatory Visit: Payer: Self-pay | Admitting: Oncology

## 2023-10-25 ENCOUNTER — Other Ambulatory Visit: Payer: Self-pay | Admitting: Pharmacist

## 2023-10-25 ENCOUNTER — Encounter: Payer: Self-pay | Admitting: Oncology

## 2023-10-25 ENCOUNTER — Inpatient Hospital Stay: Payer: 59 | Admitting: Oncology

## 2023-10-25 ENCOUNTER — Other Ambulatory Visit: Payer: Self-pay

## 2023-10-25 ENCOUNTER — Telehealth: Payer: Self-pay

## 2023-10-25 ENCOUNTER — Inpatient Hospital Stay: Payer: 59

## 2023-10-25 ENCOUNTER — Inpatient Hospital Stay: Payer: 59 | Attending: Hematology and Oncology

## 2023-10-25 ENCOUNTER — Ambulatory Visit

## 2023-10-25 ENCOUNTER — Inpatient Hospital Stay

## 2023-10-25 VITALS — BP 78/50 | HR 97 | Temp 98.1°F | Resp 18 | Ht <= 58 in | Wt 122.6 lb

## 2023-10-25 DIAGNOSIS — D509 Iron deficiency anemia, unspecified: Secondary | ICD-10-CM | POA: Diagnosis not present

## 2023-10-25 DIAGNOSIS — C50412 Malignant neoplasm of upper-outer quadrant of left female breast: Secondary | ICD-10-CM

## 2023-10-25 DIAGNOSIS — Z170 Estrogen receptor positive status [ER+]: Secondary | ICD-10-CM

## 2023-10-25 DIAGNOSIS — D630 Anemia in neoplastic disease: Secondary | ICD-10-CM | POA: Diagnosis not present

## 2023-10-25 DIAGNOSIS — R251 Tremor, unspecified: Secondary | ICD-10-CM | POA: Insufficient documentation

## 2023-10-25 DIAGNOSIS — Z1731 Human epidermal growth factor receptor 2 positive status: Secondary | ICD-10-CM | POA: Diagnosis not present

## 2023-10-25 DIAGNOSIS — D539 Nutritional anemia, unspecified: Secondary | ICD-10-CM

## 2023-10-25 DIAGNOSIS — D529 Folate deficiency anemia, unspecified: Secondary | ICD-10-CM | POA: Insufficient documentation

## 2023-10-25 DIAGNOSIS — K5900 Constipation, unspecified: Secondary | ICD-10-CM | POA: Insufficient documentation

## 2023-10-25 DIAGNOSIS — Z87891 Personal history of nicotine dependence: Secondary | ICD-10-CM | POA: Insufficient documentation

## 2023-10-25 DIAGNOSIS — C7951 Secondary malignant neoplasm of bone: Secondary | ICD-10-CM

## 2023-10-25 DIAGNOSIS — Z79810 Long term (current) use of selective estrogen receptor modulators (SERMs): Secondary | ICD-10-CM | POA: Insufficient documentation

## 2023-10-25 DIAGNOSIS — M87180 Osteonecrosis due to drugs, jaw: Secondary | ICD-10-CM | POA: Insufficient documentation

## 2023-10-25 DIAGNOSIS — Z923 Personal history of irradiation: Secondary | ICD-10-CM | POA: Insufficient documentation

## 2023-10-25 DIAGNOSIS — E876 Hypokalemia: Secondary | ICD-10-CM | POA: Insufficient documentation

## 2023-10-25 LAB — CBC WITH DIFFERENTIAL (CANCER CENTER ONLY)
Abs Immature Granulocytes: 0.03 10*3/uL (ref 0.00–0.07)
Abs Immature Granulocytes: 0.02 10*3/uL (ref 0.00–0.07)
Basophils Absolute: 0 10*3/uL (ref 0.0–0.1)
Basophils Absolute: 0 10*3/uL (ref 0.0–0.1)
Basophils Relative: 1 %
Basophils Relative: 0 %
Eosinophils Absolute: 0.2 10*3/uL (ref 0.0–0.5)
Eosinophils Absolute: 0.2 10*3/uL (ref 0.0–0.5)
Eosinophils Relative: 4 %
Eosinophils Relative: 3 %
HCT: 21.5 % — ABNORMAL LOW (ref 36.0–46.0)
HCT: 20.7 % — ABNORMAL LOW (ref 36.0–46.0)
Hemoglobin: 6.4 g/dL — CL (ref 12.0–15.0)
Hemoglobin: 6 g/dL — CL (ref 12.0–15.0)
Immature Granulocytes: 1 %
Immature Granulocytes: 0 %
Immature Platelet Fraction: 1.3 % (ref 1.2–8.6)
Lymphocytes Relative: 9 %
Lymphocytes Relative: 9 %
Lymphs Abs: 0.5 10*3/uL — ABNORMAL LOW (ref 0.7–4.0)
Lymphs Abs: 0.5 10*3/uL — ABNORMAL LOW (ref 0.7–4.0)
MCH: 28.6 pg (ref 26.0–34.0)
MCH: 28 pg (ref 26.0–34.0)
MCHC: 29.8 g/dL — ABNORMAL LOW (ref 30.0–36.0)
MCHC: 29 g/dL — ABNORMAL LOW (ref 30.0–36.0)
MCV: 96 fL (ref 80.0–100.0)
MCV: 96.7 fL (ref 80.0–100.0)
Monocytes Absolute: 0.6 10*3/uL (ref 0.1–1.0)
Monocytes Absolute: 0.6 10*3/uL (ref 0.1–1.0)
Monocytes Relative: 10 %
Monocytes Relative: 12 %
Neutro Abs: 4.2 10*3/uL (ref 1.7–7.7)
Neutro Abs: 3.9 10*3/uL (ref 1.7–7.7)
Neutrophils Relative %: 75 %
Neutrophils Relative %: 76 %
Platelet Count: 340 10*3/uL (ref 150–400)
Platelet Count: 377 10*3/uL (ref 150–400)
RBC: 2.24 MIL/uL — ABNORMAL LOW (ref 3.87–5.11)
RBC: 2.14 MIL/uL — ABNORMAL LOW (ref 3.87–5.11)
RDW: 22.6 % — ABNORMAL HIGH (ref 11.5–15.5)
RDW: 22.6 % — ABNORMAL HIGH (ref 11.5–15.5)
WBC Count: 5.4 10*3/uL (ref 4.0–10.5)
WBC Count: 5.2 10*3/uL (ref 4.0–10.5)
nRBC: 0 /100{WBCs}
nRBC: 0.02 % (ref 0.0–0.2)
nRBC: 0 /100{WBCs}
nRBC: 0.02 % (ref 0.0–0.2)

## 2023-10-25 LAB — CMP (CANCER CENTER ONLY)
ALT: 13 U/L (ref 0–44)
AST: 19 U/L (ref 15–41)
Albumin: 3.4 g/dL — ABNORMAL LOW (ref 3.5–5.0)
Alkaline Phosphatase: 75 U/L (ref 38–126)
Anion gap: 8 (ref 5–15)
BUN: 15 mg/dL (ref 6–20)
CO2: 31 mmol/L (ref 22–32)
Calcium: 9.1 mg/dL (ref 8.9–10.3)
Chloride: 101 mmol/L (ref 98–111)
Creatinine: 0.55 mg/dL (ref 0.44–1.00)
GFR, Estimated: >60 mL/min (ref >60)
Glucose, Bld: 138 mg/dL — ABNORMAL HIGH (ref 70–99)
Potassium: 4 mmol/L (ref 3.5–5.1)
Sodium: 140 mmol/L (ref 135–145)
Total Bilirubin: <0.2 mg/dL (ref 0.0–1.2)
Total Protein: 5.2 g/dL — ABNORMAL LOW (ref 6.5–8.1)

## 2023-10-25 LAB — RETICULOCYTES
Immature Retic Fract: 26.6 % — ABNORMAL HIGH (ref 2.3–15.9)
RBC.: 2.12 MIL/uL — ABNORMAL LOW (ref 3.87–5.11)
Retic Count, Absolute: 196.9 10*3/uL — ABNORMAL HIGH (ref 19.0–186.0)
Retic Ct Pct: 9.3 % — ABNORMAL HIGH (ref 0.4–3.1)

## 2023-10-25 LAB — IRON AND TIBC
Iron: 21 ug/dL — ABNORMAL LOW (ref 28–170)
Saturation Ratios: 8 % — ABNORMAL LOW (ref 10.4–31.8)
TIBC: 251 ug/dL (ref 250–450)
UIBC: 230 ug/dL

## 2023-10-25 LAB — FOLATE: Folate: 5.6 ng/mL — ABNORMAL LOW (ref >5.9)

## 2023-10-25 LAB — SAMPLE TO BLOOD BANK

## 2023-10-25 LAB — LACTATE DEHYDROGENASE: LDH: 143 U/L (ref 98–192)

## 2023-10-25 LAB — FERRITIN: Ferritin: 129 ng/mL (ref 11–307)

## 2023-10-25 LAB — PREPARE RBC (CROSSMATCH)

## 2023-10-25 LAB — VITAMIN B12: Vitamin B-12: 317 pg/mL (ref 180–914)

## 2023-10-25 MED ORDER — SODIUM CHLORIDE 0.9% FLUSH: 10.0000 mL | INTRAVENOUS | Status: DC | PRN

## 2023-10-25 MED ORDER — HEPARIN SOD (PORK) LOCK FLUSH 100 UNIT/ML IV SOLN
500.0000 [IU] | Freq: Every day | INTRAVENOUS | Status: DC | PRN
Start: 2023-10-25 — End: 2023-10-25

## 2023-10-25 MED ORDER — DIPHENHYDRAMINE HCL 25 MG PO CAPS: 25.0000 mg | ORAL_CAPSULE | Freq: Once | ORAL | Status: DC

## 2023-10-25 MED ORDER — SODIUM CHLORIDE 0.9% IV SOLUTION: 250.0000 mL | INTRAVENOUS | Status: DC

## 2023-10-25 MED ORDER — ACETAMINOPHEN 325 MG PO TABS: 650.0000 mg | ORAL_TABLET | Freq: Once | ORAL | Status: DC

## 2023-10-25 MED ORDER — FOLIC ACID 1 MG PO TABS: 1.0000 mg | ORAL_TABLET | Freq: Every day | ORAL | 5 refills | Status: DC

## 2023-10-25 NOTE — Progress Notes (Signed)
 Face to face contact with pt and her husband in lobby. Pt reports that she still has some sickness with her treatments but she has accepted that its just part of it. Pt is here today for Med Onc follow up and infusion. Encouraged pt to call with any questions or concerns.

## 2023-10-25 NOTE — Patient Instructions (Signed)

## 2023-10-25 NOTE — Progress Notes (Signed)
 CRITICAL VALUE STICKER  CRITICAL VALUE:  Hgb 6.0  RECEIVER (on-site recipient of call):  Dyane Dustman RN  DATE & TIME NOTIFIED:   10/25/2023 @ 0919  MESSENGER (representative from lab):  [facility]  MD NOTIFIED:   Dr. Gilman Buttner  TIME OF NOTIFICATION:  0920  RESPONSE:   Transfuse 2 units today.  Dr. Gilman Buttner seeing patient.

## 2023-10-25 NOTE — Progress Notes (Signed)
 Per verbal order Dr Gilman Buttner, may run blood at 300 ml/hr max

## 2023-10-25 NOTE — Telephone Encounter (Signed)
 CRITICAL VALUE STICKER  CRITICAL VALUE:HGB 6.4  RECEIVER (on-site recipient of call): Marylene Land, LPN   DATE & TIME NOTIFIED: 10/25/23 @ 0830  MESSENGER (representative from lab): Vonna Kotyk in [facility] lab  MD NOTIFIED: Dr. Gilman Buttner  TIME OF NOTIFICATION: [phone]  RESPONSE:  Arrange for blood transfusion.

## 2023-10-25 NOTE — Progress Notes (Deleted)
 CRITICAL VALUE STICKER  CRITICAL VALUE:  ANC .03 Plt 36 Hgb 9  RECEIVER (on-site recipient of call):  Dyane Dustman RN  DATE & TIME NOTIFIED:   10/25/2023 @ 1025  MESSENGER (representative from lab):  [facility]  MD NOTIFIED:   Dr. Melvyn Neth   TIME OF NOTIFICATION:  1055  RESPONSE:  Dr. Melvyn Neth scheduled to see patient.

## 2023-10-26 LAB — HAPTOGLOBIN: Haptoglobin: 248 mg/dL (ref 33–346)

## 2023-10-28 ENCOUNTER — Inpatient Hospital Stay

## 2023-10-28 VITALS — BP 97/75 | HR 100 | Temp 98.4°F | Resp 16

## 2023-10-28 DIAGNOSIS — C7951 Secondary malignant neoplasm of bone: Secondary | ICD-10-CM

## 2023-10-28 DIAGNOSIS — C50412 Malignant neoplasm of upper-outer quadrant of left female breast: Secondary | ICD-10-CM | POA: Diagnosis not present

## 2023-10-28 LAB — TYPE AND SCREEN
ABO/RH(D): O POS
Antibody Screen: NEGATIVE
Unit division: 0
Unit division: 0

## 2023-10-28 LAB — BPAM RBC
Blood Product Expiration Date: 202504102359
Blood Product Expiration Date: 202504102359
ISSUE DATE / TIME: 202503141236
ISSUE DATE / TIME: 202503141236
Unit Type and Rh: 5100
Unit Type and Rh: 5100

## 2023-10-28 MED ORDER — SODIUM CHLORIDE 0.9% FLUSH
10.0000 mL | Freq: Once | INTRAVENOUS | Status: AC | PRN
  Administered 2023-10-28: 10 mL

## 2023-10-28 MED ORDER — SODIUM CHLORIDE 0.9 % IV SOLN: INTRAVENOUS | Status: DC

## 2023-10-28 MED ORDER — IRON SUCROSE 20 MG/ML IV SOLN
200.0000 mg | Freq: Once | INTRAVENOUS | Status: AC
  Administered 2023-10-28: 200 mg via INTRAVENOUS
  Filled 2023-10-28: qty 10

## 2023-10-28 MED ORDER — AMOXICILLIN 500 MG PO CAPS: 500.0000 mg | ORAL_CAPSULE | Freq: Four times a day (QID) | ORAL | 0 refills | Status: DC

## 2023-10-28 MED ORDER — HEPARIN SOD (PORK) LOCK FLUSH 100 UNIT/ML IV SOLN
500.0000 [IU] | Freq: Once | INTRAVENOUS | Status: AC | PRN
Start: 2023-10-28 — End: 2023-10-28
  Administered 2023-10-28: 500 [IU]

## 2023-10-28 NOTE — Progress Notes (Signed)
 Patient noted to have left sided jaw swelling that is painful and warm to the touch. Patient states this has happened in the past and was prescribed ABX with relief. Tracey Harries, PA made aware. Harvin Hazel,, PA at chairside to assess patient and order for ABX placed.

## 2023-10-28 NOTE — Patient Instructions (Signed)

## 2023-10-29 ENCOUNTER — Encounter: Payer: Self-pay | Admitting: Oncology

## 2023-10-29 ENCOUNTER — Inpatient Hospital Stay: Admitting: Oncology

## 2023-10-29 ENCOUNTER — Inpatient Hospital Stay

## 2023-10-29 VITALS — BP 94/65 | HR 106 | Temp 98.2°F | Resp 16 | Ht <= 58 in | Wt 120.4 lb

## 2023-10-29 VITALS — BP 94/65 | HR 106 | Resp 16

## 2023-10-29 DIAGNOSIS — Z170 Estrogen receptor positive status [ER+]: Secondary | ICD-10-CM

## 2023-10-29 DIAGNOSIS — C50412 Malignant neoplasm of upper-outer quadrant of left female breast: Secondary | ICD-10-CM

## 2023-10-29 DIAGNOSIS — D509 Iron deficiency anemia, unspecified: Secondary | ICD-10-CM

## 2023-10-29 DIAGNOSIS — C7951 Secondary malignant neoplasm of bone: Secondary | ICD-10-CM | POA: Diagnosis not present

## 2023-10-29 LAB — CBC WITH DIFFERENTIAL (CANCER CENTER ONLY)
Abs Immature Granulocytes: 0.03 10*3/uL (ref 0.00–0.07)
Basophils Absolute: 0 10*3/uL (ref 0.0–0.1)
Basophils Relative: 0 %
Eosinophils Absolute: 0.3 10*3/uL (ref 0.0–0.5)
Eosinophils Relative: 4 %
HCT: 34.1 % — ABNORMAL LOW (ref 36.0–46.0)
Hemoglobin: 10.4 g/dL — ABNORMAL LOW (ref 12.0–15.0)
Immature Granulocytes: 0 %
Lymphocytes Relative: 7 %
Lymphs Abs: 0.5 10*3/uL — ABNORMAL LOW (ref 0.7–4.0)
MCH: 28.1 pg (ref 26.0–34.0)
MCHC: 30.5 g/dL (ref 30.0–36.0)
MCV: 92.2 fL (ref 80.0–100.0)
Monocytes Absolute: 0.9 10*3/uL (ref 0.1–1.0)
Monocytes Relative: 13 %
Neutro Abs: 5.2 10*3/uL (ref 1.7–7.7)
Neutrophils Relative %: 76 %
Platelet Count: 403 10*3/uL — ABNORMAL HIGH (ref 150–400)
RBC: 3.7 MIL/uL — ABNORMAL LOW (ref 3.87–5.11)
RDW: 18.6 % — ABNORMAL HIGH (ref 11.5–15.5)
WBC Count: 6.9 10*3/uL (ref 4.0–10.5)
nRBC: 0 % (ref 0.0–0.2)
nRBC: 0 /100{WBCs}

## 2023-10-29 LAB — CMP (CANCER CENTER ONLY)
ALT: 14 U/L (ref 0–44)
AST: 20 U/L (ref 15–41)
Albumin: 3.4 g/dL — ABNORMAL LOW (ref 3.5–5.0)
Alkaline Phosphatase: 96 U/L (ref 38–126)
Anion gap: 11 (ref 5–15)
BUN: 11 mg/dL (ref 6–20)
CO2: 26 mmol/L (ref 22–32)
Calcium: 8.9 mg/dL (ref 8.9–10.3)
Chloride: 103 mmol/L (ref 98–111)
Creatinine: 0.51 mg/dL (ref 0.44–1.00)
GFR, Estimated: >60 mL/min (ref >60)
Glucose, Bld: 145 mg/dL — ABNORMAL HIGH (ref 70–99)
Potassium: 3.9 mmol/L (ref 3.5–5.1)
Sodium: 140 mmol/L (ref 135–145)
Total Bilirubin: 0.3 mg/dL (ref 0.0–1.2)
Total Protein: 5.6 g/dL — ABNORMAL LOW (ref 6.5–8.1)

## 2023-10-29 LAB — SAMPLE TO BLOOD BANK

## 2023-10-29 MED ORDER — SODIUM CHLORIDE 0.9% FLUSH
10.0000 mL | Freq: Once | INTRAVENOUS | Status: AC | PRN
  Administered 2023-10-29: 10 mL

## 2023-10-29 MED ORDER — SODIUM CHLORIDE 0.9 % IV SOLN: INTRAVENOUS | Status: DC

## 2023-10-29 MED ORDER — IRON SUCROSE 20 MG/ML IV SOLN
200.0000 mg | Freq: Once | INTRAVENOUS | Status: AC
  Administered 2023-10-29: 200 mg via INTRAVENOUS
  Filled 2023-10-29: qty 10

## 2023-10-29 MED ORDER — HEPARIN SOD (PORK) LOCK FLUSH 100 UNIT/ML IV SOLN
500.0000 [IU] | Freq: Once | INTRAVENOUS | Status: AC | PRN
  Administered 2023-10-29: 500 [IU]

## 2023-10-29 NOTE — Progress Notes (Signed)
 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 She was on adjuvant hormonal therapy with tamoxifen 20 mg daily from October 2015 to May 2020, but due to elevation of the liver transaminases, was switched to anastrazole.  Malignant neoplasm metastatic to bone [facility] Bone metastases diagnosed in November 2022, with multiple marrow replacing lesions within the proximal right femur within the ischium as well as the inferior rami. 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 Ki67 was 60%. PET imaging in December revealed dominant finding of intensely hypermetabolic aggressive skeletal metastasis involving the calvarium, sternum, thoracic and lumbar spine, and pelvis, with potential pathologic fracture of the right femur with internal fixation. There was soft tissue extension into the anterior mediastinum associated with the manubrial metastasis. Multiple spinal vertebral body lesions had cortical destruction along the posterior margin. She started monthly Xgeva in December, and completed radiation therapy. She was receiving THP, but cycle 4 was delayed and dose reduction made by 20% due to left leg skin infection. She continued with Herceptin/Perjeta until she had progression of disease. In January 2024, she developed increased pain of the left hip. She was found to have innumerable metastatic lesions throughout the pelvis and proximal femur. There is a non displaced pathologic fracture through a large lesion in the superior left acetabulum. She was referred to an orthopedic  oncologist at [facility] and they recommended radiation and no surgery. She received her palliative radiation. She followed-up with [facility] in April and they do not recommend surgery, but will continue surveillance.    We then switched her to Enhertu chemotherapy, but she has had significant toxicities of nausea, vomiting, diarrhea, and hypokalemia, but these are being addressed and corrected,  She was also placed on hormonal therapy with Faslodex injections. CT chest, abdomen, and pelvis done on 09/05/2023 that reveals unchanged size of the mass in the left breast with overlying skin thickening, no significant interval change in the widespread osseous metastatic disease involving the axial and proximal appendicular skeleton, similar soft tissue thickening about the left subclavian structures and thoracic inlet, possibly reflecting posttreatment change. No evidence of new or progressive disease within the chest, abdomen, or pelvis was also noted. Her CA 27.29 has been steadily decreasing, from 53 to 46 to 28.8 on 09/13/2023. It had originally been up to 106.   Abnormal Mammogram She does have thickening of the skin and trabecular thickening of the left breast. I find no significant change on physical exam and feel that a lot of this can be post radiation change. In view of her widespread bone metastases, I will not pursue further evaluation but will continue periodic breast exams.  On CT scan this nodule of the left breast appears to be 12 mm, down from 13 mm in size  Tremor New right hand/arm tremor of uncertain etiology, with occasional blurry vision. The tremor is less pronounced. She had an MRI brain in January, 2025 revealed multiple sclerotic bone lesions but no brain abnormalities.   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 She was  found to be iron deficient in September 2023 and was given IV iron supplement.  Repeat evaluation in November did not reveal recurrent iron deficiency. B12 was low normal, so I started her on oral B12 500 mcg daily. Folate was normal. Now she has a severe acute drop in her hemoglobin from 10 to 6 over the last 3 weeks so I am evaluating for iron deficiency or hemolysis. We will arrange for transfusion of 2 units of PRBC's. She is once again iron deficient so she will receive IV iron. We will keep Enhertu on hold until this is evaluated.    Osteonecrosis of the Right Jaw She developed pain of the right jaw in February, 2025 and panorex x-rays confirm an abnormal area in the right lower jaw. Her dentist is working on stabilizing her dental situation and she has had 3 fillings. She has follow-up schedule de in 2 weeks. At present she has inflammation of the chin and right lower face so I will treat her with Augmentin for 2 weeks and monitor the situation closely. Her Rivka Barbara has been discontinued and I explained the diagnosis to her and her husband.    Plan: She informed me that she was seen at wound care this morning and her legs are doing well. Patient was placed on Amoxicillin 500mg  for her jaw swelling and erythema. She has had 3 units of packed RBC's. She was then found to be iron deficient and was placed on IV iron infusions. She has a WBC of 6.9, a low hemoglobin of 10.4 up from 6.0, and elevated platelet count of 403,000. Patient notes having melena once but this has since resolved. I will refer her to Dr. Chales Abrahams for further evaluation and probable EGD. Her CMP is normal other than a low total protein of 5.6 up from 5.2 and albumin of 3.4. Her CA 27.29 today Is pending. On 10/25/2023 she had labs done. LDH, B-12, haptoglobin, ferritin, were normal, however, she had a low folate of 5.6, low iron of 21 with a saturation ratio of 8, but did have a elevated reticulocyte count of 9.3. I will see her back in 1 week with CBC and CMP. The patient understands the plans discussed today and is in  agreement with them.  She knows to contact our office if she develops concerns prior to her next appointment.   I provided 15 minutes of face-to-face time during this encounter and > 50% was spent counseling as documented under my assessment and plan.   Dellia Beckwith, MD  [facility] - A DEPT OF [facility] [address] [place] [address] Dept: [phone] Dept Fax: [phone]   Orders Placed This Encounter  Procedures   Ambulatory referral to Gastroenterology    Referral Priority:   Routine    Referral Type:   Consultation    Referral Reason:   Specialty Services Required    Referred to Provider:   Lynann Bologna, MD    Number of Visits Requested:   1   Blood transfusion report - scanned   CHIEF COMPLAINT:  CC: Metastatic hormone receptor positive breast cancer  Current Treatment: Enhertu every 3 weeks  HISTORY OF PRESENT ILLNESS:   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 (HCC)   07/23/2013 Cancer Staging   Staging form: Breast, AJCC 7th Edition -  Clinical stage from 07/23/2013: Stage IIB (T2, N1, M0) - Signed by Dellia Beckwith, MD on 07/04/2020 Prognostic indicators: Pos LVI   09/22/2021 - 04/06/2022 Chemotherapy   Patient is on Treatment Plan : BREAST  Trastuzumab + Pertuzumab q21d      09/22/2021 - 09/21/2022 Chemotherapy   Patient is on Treatment Plan : BREAST Trastuzumab + Pertuzumab q21d     10/12/2022 -  Chemotherapy   Patient is on Treatment Plan : BREAST METASTATIC Fam-Trastuzumab Deruxtecan-nxki (Enhertu) (5.4) q21d     Malignant neoplasm metastatic to bone (HCC)  06/28/2021 Initial Diagnosis   Bone metastases (HCC)   09/22/2021 - 04/06/2022 Chemotherapy   Patient is on Treatment Plan : BREAST  Trastuzumab + Pertuzumab q21d      09/22/2021 -  09/21/2022 Chemotherapy   Patient is on Treatment Plan : BREAST Trastuzumab + Pertuzumab q21d     05/11/2022 Imaging   CT chest, abdomen and pelvis:  IMPRESSION:  1. Widespread bony metastatic disease shows increased sclerosis  since previous imaging. This is likely related to response to  therapy in the interval. There is a single lesion along the LEFT  iliac which shows continued lytic change and warrants attention on  follow-up  2. Interval development of pathologic fractures at in the upper  thoracic spine and at the thoracolumbar junction with associated  kyphosis.  3. No signs of solid organ or nodal metastatic disease.  4. Marked elevation of the LEFT hemidiaphragm as on the prior PET.  5. Aortic atherosclerosis.    10/12/2022 -  Chemotherapy   Patient is on Treatment Plan : BREAST METASTATIC Fam-Trastuzumab Deruxtecan-nxki (Enhertu) (5.4) q21d       INTERVAL HISTORY:  Anna [name] is here today for repeat clinical assessment for her metastatic hormone receptor positive left breast cancer. She had a suspicious area of the left breast last year and on CT this nodule remains stable. MRI head done on 08/20/2023 that revealed multiple sclerotic foci within the calvarium that is likely treated osseous metastasis. She also had a CT chest, abdomen, and pelvis done on 09/05/2023 that revealed no significant interval change in the widespread osseous metastatic disease. Patient states that she feels ok but complains of back pain rating 8/10, jaw/teeth/facial pain with swelling and redness of the chin. She informed me that she was seen at wound care this morning and her legs are doing well. Patient was placed on Amoxicillin 500 mg for her jaw swelling and erythema. She has had 3 units of packed RBC's. She was then found to be iron deficient and was placed on IV iron infusions. She has a WBC of 6.9, a low hemoglobin of 10.4 up from 6.0, and elevated platelet count of 403,000. Patient notes having melena  once but this has since resolved. I will refer her to Dr. Chales Abrahams for further evaluation and probable EGD. Her CMP is normal other than a low total protein of 5.6 up from 5.2 and albumin of 3.4. Her CA 27.29 today Is pending. On 10/25/2023 she had labs done. LDH, B-12, haptoglobin, ferritin, were normal, however, she had a low folate of 5.6, low iron of 21 with a saturation ratio of 8, but did have a elevated reticulocyte count of 9.3. I will see her back in 1 week with CBC and CMP. She denies signs of infection such as sore throat, sinus drainage, cough, or urinary symptoms.  She denies fevers or recurrent chills. She denies nausea, vomiting, chest pain, or cough. Her appetite is poor  and her weight has decreased 2 pounds over last 4 days . This patient is accompanied in the office by her husband.   REVIEW OF SYSTEMS:  Review of Systems  Constitutional:  Positive for appetite change (due to jaw pain) and fatigue. Negative for chills, diaphoresis, fever and unexpected weight change.  HENT:   Negative for hearing loss, lump/mass, mouth sores, nosebleeds, sore throat, tinnitus, trouble swallowing and voice change.        Lower right jaw pain, teeth and facial pain.  Jaw/facial swelling with erythema  Eyes:  Positive for eye problems (blurry vision). Negative for icterus.  Respiratory: Negative.  Negative for chest tightness, cough, hemoptysis, shortness of breath and wheezing.   Cardiovascular: Negative.  Negative for chest pain, leg swelling and palpitations.  Gastrointestinal:  Positive for abdominal pain, blood in stool (transient black stools), constipation and nausea. Negative for abdominal distention, diarrhea, rectal pain and vomiting.  Endocrine: Negative.  Negative for hot flashes.  Genitourinary: Negative.  Negative for bladder incontinence, difficulty urinating, dysuria, frequency, hematuria, menstrual problem, nocturia, pelvic pain, vaginal bleeding and vaginal discharge.   Musculoskeletal:   Positive for back pain (8/10). Negative for arthralgias, flank pain, gait problem, myalgias, neck pain and neck stiffness.       Buttock pain 5/10  Skin: Negative.  Negative for itching, rash and wound.  Neurological:  Positive for numbness (neuropathy bilateral hands and feet). Negative for dizziness, extremity weakness, gait problem, headaches, light-headedness, seizures and speech difficulty.  Hematological: Negative.  Negative for adenopathy. Does not bruise/bleed easily.  Psychiatric/Behavioral: Negative.  Negative for confusion, depression and sleep disturbance. The patient is not nervous/anxious.     VITALS:  Blood pressure 94/65, pulse (!) 106, temperature 98.2 F (36.8 C), temperature source Oral, resp. rate 16, height 4\' 10"  (1.473 m), weight 120 lb 6.4 oz (54.6 kg), last menstrual period 08/18/2013, SpO2 96%.  Wt Readings from Last 3 Encounters:  11/05/23 121 lb 4.8 oz (55 kg)  11/01/23 120 lb 4 oz (54.5 kg)  10/29/23 120 lb 6.4 oz (54.6 kg)    Body mass index is 25.16 kg/m.  Performance status (ECOG): 2 - Symptomatic, <50% confined to bed  PHYSICAL EXAM:  Physical Exam Vitals and nursing note reviewed.  Constitutional:      General: She is not in acute distress.    Appearance: Normal appearance. She is normal weight. She is not ill-appearing, toxic-appearing or diaphoretic.  HENT:     Head: Normocephalic and atraumatic.     Jaw: No swelling or pain on movement.     Right Ear: Tympanic membrane, ear canal and external ear normal. There is no impacted cerumen.     Left Ear: Tympanic membrane, ear canal and external ear normal. There is no impacted cerumen.     Nose: Nose normal. No congestion or rhinorrhea.     Mouth/Throat:     Mouth: Mucous membranes are moist.     Pharynx: Oropharynx is clear. No oropharyngeal exudate or posterior oropharyngeal erythema.  Eyes:     General: No scleral icterus.       Right eye: No discharge.        Left eye: No discharge.      Extraocular Movements: Extraocular movements intact.     Conjunctiva/sclera: Conjunctivae normal.     Pupils: Pupils are equal, round, and reactive to light.  Neck:     Vascular: No carotid bruit.     Comments: Limited ability to extend the neck Cardiovascular:  Rate and Rhythm: Normal rate and regular rhythm.     Pulses: Normal pulses.     Heart sounds: Normal heart sounds. No murmur heard.    No friction rub. No gallop.  Pulmonary:     Effort: Pulmonary effort is normal. No respiratory distress.     Breath sounds: Normal breath sounds. No stridor or decreased air movement. No decreased breath sounds, wheezing, rhonchi or rales.  Chest:     Chest wall: No tenderness.     Comments: Breast exam is deferred Abdominal:     General: Bowel sounds are normal. There is no distension.     Palpations: Abdomen is soft. There is no hepatomegaly, splenomegaly or mass.     Tenderness: There is no abdominal tenderness. There is no right CVA tenderness, left CVA tenderness, guarding or rebound.     Hernia: No hernia is present.  Musculoskeletal:        General: No swelling, tenderness, deformity or signs of injury.     Cervical back: Rigidity (chronic) present. No tenderness. No spinous process tenderness or muscular tenderness. Decreased range of motion.     Right lower leg: No edema.     Left lower leg: No edema.  Lymphadenopathy:     Cervical: No cervical adenopathy.     Right cervical: No superficial, deep or posterior cervical adenopathy.    Left cervical: No superficial, deep or posterior cervical adenopathy.     Upper Body:     Right upper body: No supraclavicular, axillary or pectoral adenopathy.     Left upper body: No supraclavicular, axillary or pectoral adenopathy.  Skin:    General: Skin is warm and dry.     Coloration: Skin is not jaundiced or pale.     Findings: No bruising, erythema, lesion or rash.  Neurological:     General: No focal deficit present.     Mental Status:  She is alert and oriented to person, place, and time. Mental status is at baseline.     Cranial Nerves: No cranial nerve deficit.     Sensory: No sensory deficit.     Motor: No weakness.     Coordination: Coordination normal.     Gait: Gait normal.     Deep Tendon Reflexes: Reflexes normal.  Psychiatric:        Mood and Affect: Mood normal.        Behavior: Behavior normal.        Thought Content: Thought content normal.        Judgment: Judgment normal.    LABS:      Latest Ref Rng & Units 11/05/2023    8:00 AM 10/29/2023    2:04 PM 10/25/2023    9:03 AM  CBC  WBC 4.0 - 10.5 K/uL 7.8  6.9  5.2   Hemoglobin 12.0 - 15.0 g/dL 16.1  09.6  6.0   Hematocrit 36.0 - 46.0 % 36.5  34.1  20.7   Platelets 150 - 400 K/uL 392  403  377       Latest Ref Rng & Units 11/05/2023    8:00 AM 10/29/2023    2:04 PM 10/25/2023    8:12 AM  CMP  Glucose 70 - 99 mg/dL 045  409  811   BUN 6 - 20 mg/dL 18  11  15    Creatinine 0.44 - 1.00 mg/dL 9.14  7.82  9.56   Sodium 135 - 145 mmol/L 138  140  140   Potassium 3.5 -  5.1 mmol/L 4.1  3.9  4.0   Chloride 98 - 111 mmol/L 101  103  101   CO2 22 - 32 mmol/L 29  26  31    Calcium 8.9 - 10.3 mg/dL 9.0  8.9  9.1   Total Protein 6.5 - 8.1 g/dL 5.6  5.6  5.2   Total Bilirubin 0.0 - 1.2 mg/dL <4.0  0.3  <9.8   Alkaline Phos 38 - 126 U/L 90  96  75   AST 15 - 41 U/L 26  20  19    ALT 0 - 44 U/L 19  14  13     Lab Results  Component Value Date   CEA1 2.2 07/20/2021   /  CEA  Date Value Ref Range Status  07/20/2021 2.2 0.0 - 4.7 ng/mL Final    Comment:    (NOTE)                             Nonsmokers          <3.9                             Smokers             <5.6 [employer] Electrochemiluminescence Immunoassay (ECLIA) Values obtained with different assay methods or kits cannot be used interchangeably.  Results cannot be interpreted as absolute evidence of the presence or absence of malignant disease. Performed At: [facility] [address] [place], [place] [id number] Jolene Schimke MD FA:[id number]    Lab Results  Component Value Date   TIBC 251 10/25/2023   TIBC 225 (L) 07/10/2023   TIBC 256 01/14/2023   FERRITIN 129 10/25/2023   FERRITIN 305 07/10/2023   FERRITIN 333 (H) 01/14/2023   IRONPCTSAT 8 (L) 10/25/2023   IRONPCTSAT 11 07/10/2023   IRONPCTSAT 16 01/14/2023   Lab Results  Component Value Date   LDH 143 10/25/2023   LDH 171 07/10/2023   Lab Results  Component Value Date   FOLATE 5.6 (L) 10/25/2023   Component Ref Range & Units (hover) 10/25/2023  Retic Ct Pct 9.3 High   RBC. 2.12 Low   Retic Count, Absolute 196.9 High   Immature Retic Fract 26.6 High      Component Ref Range & Units (hover) 10/25/2023   3 mo ago  Haptoglobin 248 356 High  CM     Lab Results  Component Value Date   VITAMINB12 317 10/25/2023    STUDIES:  EXAM: 09/05/2023 CT CHEST, ABDOMEN, AND PELVIS WITH CONTRAST IMPRESSION: 1. Unchanged size of the mass in the left breast with overlying skin thickening. 2. No significant interval change in the widespread osseous metastatic disease involving the axial and proximal appendicular skeleton. 3. Similar soft tissue thickening about the left subclavian structures and thoracic inlet, possibly reflecting posttreatment change. Suggest continued attention on follow-up imaging. 4. No evidence of new or progressive disease within the chest, abdomen or pelvis. 5.  Aortic Atherosclerosis (ICD10-I70.0).     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   Bipolar disorder (HCC) 11/29/2020   Breast cancer (HCC)    Cancer (HCC)    Cardiac murmur    Chest pain  Chronic fatigue syndrome 11/29/2020   Depression    Essential hypertension 11/29/2020   Hepatic  steatosis determined by biopsy of liver 07/04/2020   HLD (hyperlipidemia)    Hypertension    Hypomagnesemia 03/26/2023   Hypothyroidism 11/29/2020   Insomnia 11/29/2020   Iron deficiency anemia 06/05/2022   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   Migraine 11/29/2020   Mild intermittent asthma 11/29/2020   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   Mixed hyperlipidemia 11/29/2020   Obesity due to excess calories    Osteopenia after menopause 07/04/2020   Other long term (current) drug therapy 11/29/2020   Other vitamin B12 deficiency anemias 11/29/2020   Personal history of malignant neoplasm of breast 11/29/2020   Pre-diabetes    Renal cyst, acquired, right 07/04/2020   14.5 cm in October 2021   Retrocalcaneal bursitis (back of heel), left 04/22/2018   Seasonal allergic rhinitis 11/29/2020   Tightness of heel cord, left 04/22/2018   Tremor 08/02/2023   Type 2 diabetes mellitus without complications (HCC) 11/29/2020   Vitamin D deficiency 11/29/2020    Past Surgical History:  Procedure Laterality Date   CESAREAN SECTION     IR BONE TUMOR(S)RF ABLATION  05/29/2022   IR BONE TUMOR(S)RF ABLATION  05/29/2022   IR KYPHO EA ADDL LEVEL THORACIC OR LUMBAR  05/29/2022   IR KYPHO THORACIC WITH BONE BIOPSY  05/29/2022   IR RADIOLOGIST EVAL & MGMT  05/22/2022   IR RADIOLOGIST EVAL & MGMT  06/12/2022   LIVER BIOPSY  01/2019   lumpectomy  2014   Left Breast   PORT-A-CATH REMOVAL     PORTACATH PLACEMENT  08/28/2013   STOMACH SURGERY     Tummy Tuck   TOTAL HIP ARTHROPLASTY Right 07/04/2021   Procedure: TOTAL HIP ARTHROPLASTY;  Surgeon: Durene Romans, MD;  Location: [facility];  Service: Orthopedics;  Laterality: Right;  90   WISDOM TOOTH EXTRACTION      Family History  Problem Relation Age of Onset   Hyperlipidemia Mother    Diabetes Father    Stroke Father    Colon cancer Neg Hx    Colon polyps Neg Hx    Esophageal cancer Neg Hx     Rectal cancer Neg Hx    Stomach cancer Neg Hx     Social History:  reports that she quit smoking about 21 years ago. Her smoking use included cigarettes. She has never used smokeless tobacco. She reports current alcohol use. She reports that she does not use drugs.The patient is accompanied by her husband today.  Allergies:  Allergies  Allergen Reactions   Reglan [Metoclopramide]     Made patient feel really funny when taking this medication    Xgeva [Denosumab] Other (See Comments)    Possible ONJ (09/13/23)    Current Medications: Current Outpatient Medications  Medication Sig Dispense Refill   albuterol (VENTOLIN HFA) 108 (90 Base) MCG/ACT inhaler Inhale 2 puffs into the lungs every 6 (six) hours as needed for shortness of breath.     amoxicillin (AMOXIL) 500 MG capsule Take 1 capsule (500 mg total) by mouth in the morning, at noon, in the evening, and at bedtime. 40 capsule 0   ARIPiprazole (ABILIFY) 5 MG tablet Take 2.5 mg by mouth daily.     aspirin 81 MG EC tablet Take 81 mg by mouth daily. Swallow whole.     atorvastatin (LIPITOR) 20 MG tablet TAKE ONE TABLET BY MOUTH EVERY DAY 90 tablet 1  augmented betamethasone dipropionate (DIPROLENE-AF) 0.05 % cream      dapagliflozin propanediol (FARXIGA) 10 MG TABS tablet TAKE ONE TABLET BY MOUTH EVERY DAY 30 tablet 10   desvenlafaxine (PRISTIQ) 50 MG 24 hr tablet Take 50 mg by mouth daily.     dexamethasone (DECADRON) 4 MG tablet TAKE ONE TABLET BY MOUTH 2 TIMES A DAY FOR 5 DAYS AFTER CHEMOTHERAPY 20 tablet 2   diphenoxylate-atropine (LOMOTIL) 2.5-0.025 MG tablet TAKE TWO TABLETS BY MOUTH FOUR TIMES A DAY AS NEEDED FOR DIARRHEA OR LOOSE STOOLS 100 tablet 1   folic acid (FOLVITE) 1 MG tablet Take 1 tablet (1 mg total) by mouth daily. 30 tablet 5   labetalol (NORMODYNE) 100 MG tablet Take 1 tablet (100 mg total) by mouth 2 (two) times daily. 180 tablet 3   loratadine (CLARITIN) 10 MG tablet TAKE ONE TABLET BY MOUTH EVERY DAY 30 tablet  2   LORazepam (ATIVAN) 1 MG tablet Take 1 tablet (1 mg total) by mouth every 8 (eight) hours. 30 tablet 1   magic mouthwash (lidocaine, diphenhydrAMINE, alum & mag hydroxide) suspension Take 5 mLs by mouth every 3 (three) hours as needed. Swish and spit; for throat/mouth pain     naloxone (NARCAN) nasal spray 4 mg/0.1 mL One spray in nostril as needed, may repeat every 2 to 3 minutes until medical assistance becomes available 4 each 1   nitroGLYCERIN (NITROSTAT) 0.4 MG SL tablet Place 0.4 mg under the tongue every 5 (five) minutes as needed for chest pain.     nystatin (MYCOSTATIN/NYSTOP) powder Apply 1 Application topically 2 (two) times daily.     omeprazole (PRILOSEC) 40 MG capsule Take 1 capsule (40 mg total) by mouth at bedtime. 30 capsule 5   ondansetron (ZOFRAN) 8 MG tablet Take 1 tablet (8 mg total) by mouth every 8 (eight) hours as needed for nausea or vomiting. 60 tablet 5   Oxycodone HCl 10 MG TABS Take 1 tablet (10 mg total) by mouth every 4 (four) hours as needed. for pain 180 tablet 0   potassium chloride SA (KLOR-CON M) 20 MEQ tablet Take 1 tablet (20 mEq total) by mouth 2 (two) times daily. (Patient taking differently: Take 20 mEq by mouth 2 (two) times daily. Taking potasium citrate 1000 mg per two gummies) 60 tablet 1   prochlorperazine (COMPAZINE) 10 MG tablet TAKE ONE TABLET BY MOUTH EVERY 6 HOURS AS NEEDED FOR NAUSEA/VOMITING 90 tablet 3   Current Facility-Administered Medications  Medication Dose Route Frequency Provider Last Rate Last Admin   technetium sestamibi generic (CARDIOLITE) injection 10.5 millicurie  10.5 millicurie Intravenous Once PRN Revankar, Aundra Dubin, MD       Facility-Administered Medications Ordered in Other Visits  Medication Dose Route Frequency Provider Last Rate Last Admin   sodium chloride flush 0.9 % injection 10 mL  10 mL Intravenous PRN Dellia Beckwith, MD   10 mL at 11/05/23 1610    I,Jasmine M Lassiter,acting as a scribe for Dellia Beckwith, MD.,have documented all relevant documentation on the behalf of Dellia Beckwith, MD,as directed by  Dellia Beckwith, MD while in the presence of Dellia Beckwith, MD.

## 2023-10-29 NOTE — Patient Instructions (Signed)

## 2023-10-30 ENCOUNTER — Telehealth: Payer: Self-pay

## 2023-10-30 ENCOUNTER — Ambulatory Visit: Payer: BC Managed Care – PPO | Admitting: Cardiology

## 2023-10-30 ENCOUNTER — Inpatient Hospital Stay

## 2023-10-30 ENCOUNTER — Encounter

## 2023-10-30 VITALS — BP 98/65 | HR 99 | Temp 98.2°F | Resp 18

## 2023-10-30 DIAGNOSIS — C50412 Malignant neoplasm of upper-outer quadrant of left female breast: Secondary | ICD-10-CM | POA: Diagnosis not present

## 2023-10-30 DIAGNOSIS — C7951 Secondary malignant neoplasm of bone: Secondary | ICD-10-CM

## 2023-10-30 LAB — CANCER ANTIGEN 27.29: CA 27.29: 27.8 U/mL (ref 0.0–38.6)

## 2023-10-30 MED ORDER — SODIUM CHLORIDE 0.9% FLUSH
10.0000 mL | Freq: Once | INTRAVENOUS | Status: AC | PRN
  Administered 2023-10-30: 10 mL

## 2023-10-30 MED ORDER — SODIUM CHLORIDE 0.9 % IV SOLN: INTRAVENOUS | Status: DC

## 2023-10-30 MED ORDER — HEPARIN SOD (PORK) LOCK FLUSH 100 UNIT/ML IV SOLN
500.0000 [IU] | Freq: Once | INTRAVENOUS | Status: AC | PRN
  Administered 2023-10-30: 500 [IU]

## 2023-10-30 MED ORDER — IRON SUCROSE 20 MG/ML IV SOLN
200.0000 mg | Freq: Once | INTRAVENOUS | Status: AC
Start: 2023-10-30 — End: 2023-10-30
  Administered 2023-10-30: 200 mg via INTRAVENOUS
  Filled 2023-10-30: qty 10

## 2023-10-30 NOTE — Patient Instructions (Signed)

## 2023-10-30 NOTE — Telephone Encounter (Signed)
-----   Message from Dellia Beckwith sent at 10/29/2023  3:32 PM EDT ----- Regarding: refer Refer Dr. Chales Abrahams for iron def, consider EGD.  He has seen before

## 2023-10-30 NOTE — Telephone Encounter (Signed)
 Referral sent via EPIC on 10/30/23.

## 2023-10-31 ENCOUNTER — Inpatient Hospital Stay

## 2023-10-31 ENCOUNTER — Ambulatory Visit: Payer: BC Managed Care – PPO | Admitting: Cardiology

## 2023-10-31 ENCOUNTER — Ambulatory Visit: Payer: 59

## 2023-10-31 VITALS — BP 106/74 | HR 100 | Temp 98.7°F | Resp 16

## 2023-10-31 DIAGNOSIS — C50412 Malignant neoplasm of upper-outer quadrant of left female breast: Secondary | ICD-10-CM | POA: Diagnosis not present

## 2023-10-31 DIAGNOSIS — C7951 Secondary malignant neoplasm of bone: Secondary | ICD-10-CM

## 2023-10-31 MED ORDER — HEPARIN SOD (PORK) LOCK FLUSH 100 UNIT/ML IV SOLN
500.0000 [IU] | Freq: Once | INTRAVENOUS | Status: AC | PRN
Start: 2023-10-31 — End: 2023-10-31
  Administered 2023-10-31: 500 [IU]

## 2023-10-31 MED ORDER — ONDANSETRON HCL 8 MG PO TABS
8.0000 mg | ORAL_TABLET | Freq: Once | ORAL | Status: AC
  Administered 2023-10-31: 8 mg via ORAL
  Filled 2023-10-31: qty 1

## 2023-10-31 MED ORDER — SODIUM CHLORIDE 0.9% FLUSH
10.0000 mL | Freq: Once | INTRAVENOUS | Status: AC | PRN
  Administered 2023-10-31: 10 mL

## 2023-10-31 MED ORDER — SODIUM CHLORIDE 0.9 % IV SOLN: INTRAVENOUS | Status: DC

## 2023-10-31 MED ORDER — IRON SUCROSE 20 MG/ML IV SOLN
200.0000 mg | Freq: Once | INTRAVENOUS | Status: AC
  Administered 2023-10-31: 200 mg via INTRAVENOUS
  Filled 2023-10-31: qty 10

## 2023-10-31 NOTE — Patient Instructions (Signed)

## 2023-11-01 ENCOUNTER — Inpatient Hospital Stay

## 2023-11-01 VITALS — BP 105/74 | HR 100 | Temp 98.1°F | Resp 16 | Ht 62.0 in | Wt 120.2 lb

## 2023-11-01 DIAGNOSIS — C50412 Malignant neoplasm of upper-outer quadrant of left female breast: Secondary | ICD-10-CM | POA: Diagnosis not present

## 2023-11-01 DIAGNOSIS — C7951 Secondary malignant neoplasm of bone: Secondary | ICD-10-CM

## 2023-11-01 MED ORDER — HEPARIN SOD (PORK) LOCK FLUSH 100 UNIT/ML IV SOLN
500.0000 [IU] | Freq: Once | INTRAVENOUS | Status: AC | PRN
  Administered 2023-11-01: 500 [IU]

## 2023-11-01 MED ORDER — IRON SUCROSE 20 MG/ML IV SOLN
200.0000 mg | Freq: Once | INTRAVENOUS | Status: AC
  Administered 2023-11-01: 200 mg via INTRAVENOUS
  Filled 2023-11-01: qty 10

## 2023-11-01 MED ORDER — SODIUM CHLORIDE 0.9 % IV SOLN: INTRAVENOUS | Status: DC

## 2023-11-01 MED ORDER — SODIUM CHLORIDE 0.9% FLUSH
10.0000 mL | Freq: Once | INTRAVENOUS | Status: AC | PRN
  Administered 2023-11-01: 10 mL

## 2023-11-01 NOTE — Patient Instructions (Signed)

## 2023-11-02 ENCOUNTER — Encounter: Payer: Self-pay | Admitting: Oncology

## 2023-11-04 ENCOUNTER — Other Ambulatory Visit: Payer: Self-pay | Admitting: Hematology and Oncology

## 2023-11-04 ENCOUNTER — Other Ambulatory Visit: Payer: Self-pay | Admitting: Nurse Practitioner

## 2023-11-04 DIAGNOSIS — S22078A Other fracture of T9-T10 vertebra, initial encounter for closed fracture: Secondary | ICD-10-CM

## 2023-11-04 DIAGNOSIS — R0789 Other chest pain: Secondary | ICD-10-CM

## 2023-11-04 DIAGNOSIS — M545 Low back pain, unspecified: Secondary | ICD-10-CM

## 2023-11-04 DIAGNOSIS — C50412 Malignant neoplasm of upper-outer quadrant of left female breast: Secondary | ICD-10-CM

## 2023-11-04 DIAGNOSIS — C7951 Secondary malignant neoplasm of bone: Secondary | ICD-10-CM

## 2023-11-04 DIAGNOSIS — D539 Nutritional anemia, unspecified: Secondary | ICD-10-CM

## 2023-11-04 DIAGNOSIS — G893 Neoplasm related pain (acute) (chronic): Secondary | ICD-10-CM

## 2023-11-04 MED ORDER — OXYCODONE HCL 10 MG PO TABS: 10.0000 mg | ORAL_TABLET | ORAL | 0 refills | Status: DC | PRN

## 2023-11-04 NOTE — Assessment & Plan Note (Addendum)
 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  She was treated with lumpectomy followed by adjuvant chemotherapy and radiation therapy. She was on adjuvant hormonal therapy with tamoxifen 20 mg daily from October 2015 to May 2020, but due to elevation of the liver transaminases was switched to anastrazole.  She then developed bone metastasis in November 2022 so has been receiving chemotherapy. She had skin thickening on her last mammogram, but a nodule in the left breast was stable on CT imaging. The skin thickening is felt to possibly due to radiation, as her exam remained stable. Continued mammograms are not recommended.

## 2023-11-04 NOTE — Assessment & Plan Note (Signed)
 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 Ki67 was 60%. PET imaging in December revealed dominant finding of intensely hypermetabolic aggressive skeletal metastasis involving the calvarium, sternum, thoracic and lumbar spine, and pelvis, with potential pathologic fracture of the right femur with internal fixation. There was soft tissue extension into the anterior mediastinum associated with the manubrial metastasis. Multiple spinal vertebral body lesions had cortical destruction along the posterior margin. She started monthly Xgeva in December, and completed radiation therapy. She was receiving THP, but cycle 4 was delayed and dose reduction made by 20% due to left leg skin infection.    She continued with Herceptin/Perjeta until she had progression of disease. In January 2024, she developed increased pain of the left hip. She was found to have innumerable metastatic lesions throughout the pelvis and proximal femur. There is a non displaced pathologic fracture through a large lesion in the superior left acetabulum. She was referred to an orthopedic oncologist at [facility] and they recommended radiation and no surgery. She received her palliative radiation. She followed-up with [facility] in April and they do not recommend surgery, but will continue surveillance.    We then switched her to [facility] chemotherapy, but she has had significant toxicities of nausea, vomiting, diarrhea, and hypokalemia.  She was also placed on hormonal therapy with Faslodex injections.  Repeat CT imaging in July revealed decreased soft tissue thickening around the left subclavian structures and thoracic inlet.  Left lateral breast mass was stable.  Widespread mixed lytic and sclerotic  osseous metastatic lesions were stable.  There was unchanged severe elevation of the left hemidiaphragm.  She continues Xgeva for her bone metastases, which is being given every 6 weeks to coincide with chemotherapy.   CT neck in October to evaluate for possible soft tissue mass and painful swallowing did not reveal any soft tissue masses.  The extensive mixed lytic and sclerotic skeletal metastases were much more pronounced in the visible upper thorax than in the cervical spine and were felt to be grossly stable from July 2024. CT chest, abdomen and pelvis in November revealed stable bony disease without evidence of new/progressive disease.   She completed 18 cycles of Enhertu, which was then placed on hold due to severe anemia.  She required multiple units of blood. She was found to be iron deficient and received IV iron in the form of Venofer for 5 doses. She was referred to Dr. Chales Abrahams to consider evaluation of the iron deficiency.  The patient states she has not heard from his office, so we will contact them about an appointment.  Fortunately, her hemoglobin continues to improve.  She will return next week with a CBC and comprehensive metabolic panel as scheduled for further treatment planning.

## 2023-11-04 NOTE — Assessment & Plan Note (Signed)
 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 She developed pain of the right jaw in February 2025.  Panorex x-rays revealed an abnormal area in the right lower jaw. She was initially treated with Augmentin.  She has been working closely with her dentist.  She had swelling of the left jaw earlier this month and was placed on amoxicillin 4 times daily. Anna [name] has been discontinued.

## 2023-11-04 NOTE — Assessment & Plan Note (Signed)
 New iron deficiency anemia diagnosed in earlier this month, when she presented with acute worsening of her anemia. She required transfusion of 3 units of PRBC's. She then received IV iron in the form of Venofer for 5 doses. She is taking oral iron now.  She was referred to Dr. Chales Abrahams for consideration of evaluation of the iron deficiency.

## 2023-11-04 NOTE — Progress Notes (Signed)
 [facility]  [address] [place],  [place]  [phone]  Clinic Day:  11/05/2023  Referring physician: Deedra Farr, MD  ASSESSMENT & PLAN:   Assessment & Plan: Malignant neoplasm metastatic to bone [facility]) Bone metastases diagnosed in November 2022, with multiple marrow replacing lesions within the proximal right femur within the ischium as well as the inferior rami. 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 Ki67 was 60%. PET imaging in December revealed dominant finding of intensely hypermetabolic aggressive skeletal metastasis involving the calvarium, sternum, thoracic and lumbar spine, and pelvis, with potential pathologic fracture of the right femur with internal fixation. There was soft tissue extension into the anterior mediastinum associated with the manubrial metastasis. Multiple spinal vertebral body lesions had cortical destruction along the posterior margin. She started monthly Xgeva in December, and completed radiation therapy. She was receiving THP, but cycle 4 was delayed and dose reduction made by 20% due to left leg skin infection.    She continued with Herceptin/Perjeta until she had progression of disease. In January 2024, she developed increased pain of the left hip. She was found to have innumerable metastatic lesions throughout the pelvis and proximal femur. There is a non displaced pathologic fracture through a large lesion in the superior left acetabulum. She was referred to an orthopedic oncologist at [facility] and they recommended radiation and no surgery. She received her palliative radiation. She followed-up with [facility] in April and they do not recommend surgery, but will continue surveillance.    We then switched her to [facility] chemotherapy, but she has had significant toxicities of nausea, vomiting, diarrhea, and hypokalemia.  She was also  placed on hormonal therapy with Faslodex injections.  Repeat CT imaging in July revealed decreased soft tissue thickening around the left subclavian structures and thoracic inlet.  Left lateral breast mass was stable.  Widespread mixed lytic and sclerotic osseous metastatic lesions were stable.  There was unchanged severe elevation of the left hemidiaphragm.  She continues Xgeva for her bone metastases, which is being given every 6 weeks to coincide with chemotherapy.   CT neck in October to evaluate for possible soft tissue mass and painful swallowing did not reveal any soft tissue masses.  The extensive mixed lytic and sclerotic skeletal metastases were much more pronounced in the visible upper thorax than in the cervical spine and were felt to be grossly stable from July 2024. CT chest, abdomen and pelvis in November revealed stable bony disease without evidence of new/progressive disease.   She completed 18 cycles of Enhertu, which was then placed on hold due to severe anemia.  She required multiple units of blood. She was found to be iron deficient and received IV iron in the form of Venofer for 5 doses. She was referred to Dr. [place] Gillis to consider evaluation of the iron deficiency.  The patient states she has not heard from his office, so we will contact them about an appointment.  Fortunately, her hemoglobin continues to improve.  She will return next week with a CBC and comprehensive metabolic panel as scheduled for further treatment planning.  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  She was treated with lumpectomy followed by adjuvant chemotherapy and radiation therapy. She was on adjuvant hormonal therapy with tamoxifen 20 mg daily from  October 2015 to May 2020, but due to elevation of the liver transaminases was switched to anastrazole.  She then developed bone metastasis in  November 2022 so has been receiving chemotherapy. She had skin thickening on her last mammogram, but a nodule in the left breast was stable on CT imaging. The skin thickening is felt to possibly due to radiation, as her exam remained stable. Continued mammograms are not recommended.  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 She developed pain of the right jaw in February 2025.  Panorex x-rays revealed an abnormal area in the right lower jaw consistent with osteonecrosis.  Xgeva was discontinued.  She was treated with Augmentin in February.  She has been working closely with her dentist. She has had 2 fillings done, but also may need root canals.  She sees him again on April 8.  She had swelling and erythema of the left jaw earlier this month, so was placed on amoxicillin 4 times daily, with resolution of the swelling and erythema.  The redness and swelling have resolved, but she states she still has some mild tenderness of the anterior left jaw.  She will contact us  if she has recurrent swelling and erythema.  Iron deficiency anemia New iron deficiency anemia diagnosed in earlier this month, when she presented with acute worsening of her anemia. She required transfusion of 3 units of PRBC's. She then received IV iron in the form of Venofer for 5 doses. She is taking oral iron now.  She was referred to Dr. [place] Gillis for consideration of evaluation of the iron deficiency.  Her hemoglobin continues to improve.  Tremor Mild right hand/arm tremor of uncertain etiology, with occasional blurry vision. The tremor is less pronounced. MRI brain in 09/15/2023 revealed multiple sclerotic bone lesions, but no intracranial abnormalities.     The patient understands the plans discussed today and is in agreement with them.  She knows to contact our office if she develops concerns prior to her next appointment.   I provided 15 minutes of face-to-face time during this encounter and >  50% was spent counseling as documented under my assessment and plan.    Olando Willems A Jamus Loving, PA-C  [facility] [place] [facility]. [facility] [address] [place] [address] Dept: [phone] Dept Fax: [phone]   No orders of the defined types were placed in this encounter.     CHIEF COMPLAINT:  CC: Recurrent HER2 receptor positive breast cancer with bone metastasis  Current Treatment: On hold  HISTORY OF PRESENT ILLNESS:   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 (HCC)   07/23/2013 Cancer Staging   Staging form: Breast, AJCC 7th Edition - Clinical stage from 07/23/2013: Stage IIB (T2, N1, M0) - Signed by Nolia Baumgartner, MD on 07/04/2020 Prognostic indicators: Pos LVI   09/22/2021 - 04/06/2022 Chemotherapy   Patient is on Treatment Plan : BREAST  Trastuzumab + Pertuzumab q21d      09/22/2021 - 09/21/2022 Chemotherapy   Patient is on Treatment Plan : BREAST Trastuzumab + Pertuzumab q21d     10/12/2022 -  Chemotherapy   Patient is on Treatment Plan : BREAST METASTATIC Fam-Trastuzumab Deruxtecan-nxki (Enhertu) (5.4) q21d     Malignant neoplasm metastatic to bone (HCC)  06/28/2021 Initial Diagnosis   Bone  metastases (HCC)   09/22/2021 - 04/06/2022 Chemotherapy   Patient is on Treatment Plan : BREAST  Trastuzumab + Pertuzumab q21d      09/22/2021 - 09/21/2022 Chemotherapy   Patient is on Treatment Plan : BREAST Trastuzumab + Pertuzumab q21d     05/11/2022 Imaging   CT chest, abdomen and pelvis:  IMPRESSION:  1. Widespread bony metastatic disease shows increased sclerosis  since previous imaging. This is likely related to response to  therapy in the interval. There is a single lesion along the LEFT  iliac which shows continued lytic change and warrants attention on  follow-up   2. Interval development of pathologic fractures at in the upper  thoracic spine and at the thoracolumbar junction with associated  kyphosis.  3. No signs of solid organ or nodal metastatic disease.  4. Marked elevation of the LEFT hemidiaphragm as on the prior PET.  5. Aortic atherosclerosis.    10/12/2022 -  Chemotherapy   Patient is on Treatment Plan : BREAST METASTATIC Fam-Trastuzumab Deruxtecan-nxki (Enhertu) (5.4) q21d         INTERVAL HISTORY:  Anna [name] is here today for repeat clinical assessment.  Enhertu has been on hold due to severe anemia.  This is felt to be potentially due to acute GI blood loss.  She was referred to Dr. [place] Gillis just last week, but has not heard from his office.  She states she has been feeling better since her transfusions and IV iron.  She states her bowel movements have been regular with taking milk of magnesia nightly.  She denies continued melena.  She denies hematochezia.  She denies other overt bleeding.  She denies any changes in her breasts.  She denies fevers or chills. She states her pain is fairly well-controlled on the current regimen. Her appetite is good. Her weight has been stable.  REVIEW OF SYSTEMS:  Review of Systems  Constitutional:  Negative for appetite change, chills, fatigue, fever and unexpected weight change.  HENT:   Negative for lump/mass, mouth sores and sore throat.   Respiratory:  Negative for cough and shortness of breath.   Cardiovascular:  Negative for chest pain and leg swelling.  Gastrointestinal:  Positive for nausea. Negative for abdominal pain, constipation, diarrhea and vomiting.  Endocrine: Negative for hot flashes.  Genitourinary:  Negative for difficulty urinating, dysuria, frequency and hematuria.   Musculoskeletal:  Positive for back pain and neck pain. Negative for arthralgias, gait problem and myalgias.  Skin:  Negative for rash.  Neurological:  Negative for dizziness, gait problem, headaches and light-headedness.   Hematological:  Negative for adenopathy. Does not bruise/bleed easily.  Psychiatric/Behavioral:  Negative for depression and sleep disturbance. The patient is not nervous/anxious.      VITALS:  Blood pressure 91/71, pulse 90, temperature 98.7 F (37.1 C), temperature source Oral, resp. rate 18, height 5\' 2"  (1.575 m), weight 121 lb 4.8 oz (55 kg), last menstrual period 08/18/2013, SpO2 97%.  Wt Readings from Last 3 Encounters:  11/05/23 121 lb 4.8 oz (55 kg)  11/01/23 120 lb 4 oz (54.5 kg)  10/29/23 120 lb 6.4 oz (54.6 kg)    Body mass index is 22.19 kg/m.  Performance status (ECOG): 2 - Symptomatic, <50% confined to bed  PHYSICAL EXAM:  Physical Exam Vitals and nursing note reviewed.  Constitutional:      General: She is not in acute distress.    Appearance: Normal appearance.  HENT:     Head: Normocephalic and atraumatic.  Mouth/Throat:     Mouth: Mucous membranes are moist.     Pharynx: Oropharynx is clear. No oropharyngeal exudate or posterior oropharyngeal erythema.  Eyes:     General: No scleral icterus.    Extraocular Movements: Extraocular movements intact.     Conjunctiva/sclera: Conjunctivae normal.     Pupils: Pupils are equal, round, and reactive to light.  Cardiovascular:     Rate and Rhythm: Normal rate and regular rhythm.     Heart sounds: Normal heart sounds. No murmur heard.    No friction rub. No gallop.  Pulmonary:     Effort: Pulmonary effort is normal.     Breath sounds: Examination of the left-middle field reveals decreased breath sounds. Examination of the left-lower field reveals decreased breath sounds. Decreased breath sounds (Due to elevated left hemidiaphragm) present. No wheezing, rhonchi or rales.  Chest:     Comments: Breast exam is deferred. Abdominal:     General: There is no distension.     Palpations: Abdomen is soft. There is no hepatomegaly, splenomegaly or mass.     Tenderness: There is no abdominal tenderness.  Musculoskeletal:         General: Normal range of motion.     Cervical back: Normal range of motion and neck supple. No tenderness.     Right lower leg: No edema.     Left lower leg: No edema.  Lymphadenopathy:     Cervical: No cervical adenopathy.     Upper Body:     Right upper body: No supraclavicular or axillary adenopathy.     Left upper body: No supraclavicular or axillary adenopathy.     Lower Body: No right inguinal adenopathy. No left inguinal adenopathy.  Skin:    General: Skin is warm and dry.     Coloration: Skin is not jaundiced.     Findings: No rash.  Neurological:     Mental Status: She is alert and oriented to person, place, and time.     Cranial Nerves: No cranial nerve deficit.  Psychiatric:        Mood and Affect: Mood normal.        Behavior: Behavior normal.        Thought Content: Thought content normal.     LABS:      Latest Ref Rng & Units 11/05/2023    8:00 AM 10/29/2023    2:04 PM 10/25/2023    9:03 AM  CBC  WBC 4.0 - 10.5 K/uL 7.8  6.9  5.2   Hemoglobin 12.0 - 15.0 g/dL 16.1  09.6  6.0   Hematocrit 36.0 - 46.0 % 36.5  34.1  20.7   Platelets 150 - 400 K/uL 392  403  377       Latest Ref Rng & Units 11/05/2023    8:00 AM 10/29/2023    2:04 PM 10/25/2023    8:12 AM  CMP  Glucose 70 - 99 mg/dL 045  409  811   BUN 6 - 20 mg/dL 18  11  15    Creatinine 0.44 - 1.00 mg/dL 9.14  7.82  9.56   Sodium 135 - 145 mmol/L 138  140  140   Potassium 3.5 - 5.1 mmol/L 4.1  3.9  4.0   Chloride 98 - 111 mmol/L 101  103  101   CO2 22 - 32 mmol/L 29  26  31    Calcium 8.9 - 10.3 mg/dL 9.0  8.9  9.1   Total Protein 6.5 - 8.1  g/dL 5.6  5.6  5.2   Total Bilirubin 0.0 - 1.2 mg/dL <4.4  0.3  <0.1   Alkaline Phos 38 - 126 U/L 90  96  75   AST 15 - 41 U/L 26  20  19    ALT 0 - 44 U/L 19  14  13       Lab Results  Component Value Date   CEA1 2.2 07/20/2021   /  CEA  Date Value Ref Range Status  07/20/2021 2.2 0.0 - 4.7 ng/mL Final    Comment:    (NOTE)                              Nonsmokers          <3.9                             Smokers             <5.6 [employer] Electrochemiluminescence Immunoassay (ECLIA) Values obtained with different assay methods or kits cannot be used interchangeably.  Results cannot be interpreted as absolute evidence of the presence or absence of malignant disease. Performed At: [facility] [address] [place], [place] [id number] Pearlean Botts MD QI:[id number]     Lab Results  Component Value Date   TIBC 251 10/25/2023   TIBC 225 (L) 07/10/2023   TIBC 256 01/14/2023   FERRITIN 129 10/25/2023   FERRITIN 305 07/10/2023   FERRITIN 333 (H) 01/14/2023   IRONPCTSAT 8 (L) 10/25/2023   IRONPCTSAT 11 07/10/2023   IRONPCTSAT 16 01/14/2023   Lab Results  Component Value Date   LDH 143 10/25/2023   LDH 171 07/10/2023    STUDIES:  No results found.    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   Bipolar disorder (HCC) 11/29/2020   Breast cancer (HCC)    Cancer (HCC)    Cardiac murmur    Chest pain    Chronic fatigue syndrome 11/29/2020   Depression    Essential hypertension 11/29/2020   Hepatic steatosis determined by biopsy of liver 07/04/2020   HLD (hyperlipidemia)    Hypertension    Hypomagnesemia 03/26/2023   Hypothyroidism 11/29/2020   Insomnia 11/29/2020   Iron deficiency anemia 06/05/2022   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   Migraine 11/29/2020   Mild intermittent asthma 11/29/2020   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   Mixed hyperlipidemia 11/29/2020   Obesity due to excess calories    Osteopenia after menopause 07/04/2020   Other long term (current) drug therapy 11/29/2020   Other vitamin B12 deficiency anemias  11/29/2020   Personal history of malignant neoplasm of breast 11/29/2020   Pre-diabetes    Renal cyst, acquired, right 07/04/2020   14.5 cm in October 2021   Retrocalcaneal bursitis (back of heel), left 04/22/2018   Seasonal allergic rhinitis 11/29/2020   Tightness of heel cord, left 04/22/2018   Tremor 08/02/2023   Type 2 diabetes mellitus without complications (HCC) 11/29/2020   Vitamin D deficiency 11/29/2020    Past Surgical History:  Procedure Laterality Date   CESAREAN SECTION     IR BONE TUMOR(S)RF ABLATION  05/29/2022  IR BONE TUMOR(S)RF ABLATION  05/29/2022   IR KYPHO EA ADDL LEVEL THORACIC OR LUMBAR  05/29/2022   IR KYPHO THORACIC WITH BONE BIOPSY  05/29/2022   IR RADIOLOGIST EVAL & MGMT  05/22/2022   IR RADIOLOGIST EVAL & MGMT  06/12/2022   LIVER BIOPSY  01/2019   lumpectomy  2014   Left Breast   PORT-A-CATH REMOVAL     PORTACATH PLACEMENT  08/28/2013   STOMACH SURGERY     Tummy Tuck   TOTAL HIP ARTHROPLASTY Right 07/04/2021   Procedure: TOTAL HIP ARTHROPLASTY;  Surgeon: Claiborne Crew, MD;  Location: [facility];  Service: Orthopedics;  Laterality: Right;  89   WISDOM TOOTH EXTRACTION      Family History  Problem Relation Age of Onset   Hyperlipidemia Mother    Diabetes Father    Stroke Father    Colon cancer Neg Hx    Colon polyps Neg Hx    Esophageal cancer Neg Hx    Rectal cancer Neg Hx    Stomach cancer Neg Hx     Social History:  reports that she quit smoking about 21 years ago. Her smoking use included cigarettes. She has never used smokeless tobacco. She reports current alcohol use. She reports that she does not use drugs.The patient is accompanied by her husband today.  Allergies:  Allergies  Allergen Reactions   Reglan [Metoclopramide]     Made patient feel really funny when taking this medication    Xgeva [Denosumab] Other (See Comments)    Possible ONJ (09/13/23)    Current Medications: Current Outpatient Medications  Medication Sig  Dispense Refill   albuterol (VENTOLIN HFA) 108 (90 Base) MCG/ACT inhaler Inhale 2 puffs into the lungs every 6 (six) hours as needed for shortness of breath.     amoxicillin (AMOXIL) 500 MG capsule Take 1 capsule (500 mg total) by mouth in the morning, at noon, in the evening, and at bedtime. 40 capsule 0   ARIPiprazole (ABILIFY) 5 MG tablet Take 2.5 mg by mouth daily.     aspirin 81 MG EC tablet Take 81 mg by mouth daily. Swallow whole.     atorvastatin (LIPITOR) 20 MG tablet TAKE ONE TABLET BY MOUTH EVERY DAY 90 tablet 1   augmented betamethasone dipropionate (DIPROLENE-AF) 0.05 % cream      dapagliflozin propanediol (FARXIGA) 10 MG TABS tablet TAKE ONE TABLET BY MOUTH EVERY DAY 30 tablet 10   desvenlafaxine (PRISTIQ) 50 MG 24 hr tablet Take 50 mg by mouth daily.     dexamethasone (DECADRON) 4 MG tablet TAKE ONE TABLET BY MOUTH 2 TIMES A DAY FOR 5 DAYS AFTER CHEMOTHERAPY 20 tablet 2   diphenoxylate-atropine (LOMOTIL) 2.5-0.025 MG tablet TAKE TWO TABLETS BY MOUTH FOUR TIMES A DAY AS NEEDED FOR DIARRHEA OR LOOSE STOOLS 100 tablet 1   folic acid (FOLVITE) 1 MG tablet Take 1 tablet (1 mg total) by mouth daily. 30 tablet 5   labetalol (NORMODYNE) 100 MG tablet Take 1 tablet (100 mg total) by mouth 2 (two) times daily. 180 tablet 3   loratadine (CLARITIN) 10 MG tablet TAKE ONE TABLET BY MOUTH EVERY DAY 30 tablet 2   LORazepam (ATIVAN) 1 MG tablet Take 1 tablet (1 mg total) by mouth every 8 (eight) hours. 30 tablet 1   magic mouthwash (lidocaine, diphenhydrAMINE, alum & mag hydroxide) suspension Take 5 mLs by mouth every 3 (three) hours as needed. Swish and spit; for throat/mouth pain     naloxone (NARCAN) nasal spray  4 mg/0.1 mL One spray in nostril as needed, may repeat every 2 to 3 minutes until medical assistance becomes available 4 each 1   nitroGLYCERIN (NITROSTAT) 0.4 MG SL tablet Place 0.4 mg under the tongue every 5 (five) minutes as needed for chest pain.     nystatin (MYCOSTATIN/NYSTOP)  powder Apply 1 Application topically 2 (two) times daily.     omeprazole (PRILOSEC) 40 MG capsule Take 1 capsule (40 mg total) by mouth at bedtime. 30 capsule 5   ondansetron (ZOFRAN) 8 MG tablet Take 1 tablet (8 mg total) by mouth every 8 (eight) hours as needed for nausea or vomiting. 60 tablet 5   Oxycodone HCl 10 MG TABS Take 1 tablet (10 mg total) by mouth every 4 (four) hours as needed. for pain 180 tablet 0   potassium chloride SA (KLOR-CON M) 20 MEQ tablet Take 1 tablet (20 mEq total) by mouth 2 (two) times daily. (Patient taking differently: Take 20 mEq by mouth 2 (two) times daily. Taking potasium citrate 1000 mg per two gummies) 60 tablet 1   prochlorperazine (COMPAZINE) 10 MG tablet TAKE ONE TABLET BY MOUTH EVERY 6 HOURS AS NEEDED FOR NAUSEA/VOMITING 90 tablet 3   Current Facility-Administered Medications  Medication Dose Route Frequency Provider Last Rate Last Admin   technetium sestamibi generic (CARDIOLITE) injection 10.5 millicurie  10.5 millicurie Intravenous Once PRN Revankar, Rajan R, MD       Facility-Administered Medications Ordered in Other Visits  Medication Dose Route Frequency Provider Last Rate Last Admin   sodium chloride flush 0.9 % injection 10 mL  10 mL Intravenous PRN Nolia Baumgartner, MD   10 mL at 11/05/23 [phone]

## 2023-11-05 ENCOUNTER — Inpatient Hospital Stay (HOSPITAL_BASED_OUTPATIENT_CLINIC_OR_DEPARTMENT_OTHER): Admitting: Hematology and Oncology

## 2023-11-05 ENCOUNTER — Inpatient Hospital Stay

## 2023-11-05 ENCOUNTER — Encounter: Payer: Self-pay | Admitting: Hematology and Oncology

## 2023-11-05 ENCOUNTER — Encounter: Payer: Self-pay | Admitting: Oncology

## 2023-11-05 VITALS — BP 91/71 | HR 90 | Temp 98.7°F | Resp 18 | Ht 62.0 in | Wt 121.3 lb

## 2023-11-05 DIAGNOSIS — D509 Iron deficiency anemia, unspecified: Secondary | ICD-10-CM

## 2023-11-05 DIAGNOSIS — M87180 Osteonecrosis due to drugs, jaw: Secondary | ICD-10-CM | POA: Diagnosis not present

## 2023-11-05 DIAGNOSIS — C7951 Secondary malignant neoplasm of bone: Secondary | ICD-10-CM | POA: Diagnosis not present

## 2023-11-05 DIAGNOSIS — C50412 Malignant neoplasm of upper-outer quadrant of left female breast: Secondary | ICD-10-CM

## 2023-11-05 DIAGNOSIS — C50919 Malignant neoplasm of unspecified site of unspecified female breast: Secondary | ICD-10-CM

## 2023-11-05 DIAGNOSIS — R251 Tremor, unspecified: Secondary | ICD-10-CM

## 2023-11-05 DIAGNOSIS — Z170 Estrogen receptor positive status [ER+]: Secondary | ICD-10-CM

## 2023-11-05 LAB — CBC WITH DIFFERENTIAL (CANCER CENTER ONLY)
Abs Immature Granulocytes: 0.05 10*3/uL (ref 0.00–0.07)
Basophils Absolute: 0.1 10*3/uL (ref 0.0–0.1)
Basophils Relative: 1 %
Eosinophils Absolute: 0.2 10*3/uL (ref 0.0–0.5)
Eosinophils Relative: 3 %
HCT: 36.5 % (ref 36.0–46.0)
Hemoglobin: 11.3 g/dL — ABNORMAL LOW (ref 12.0–15.0)
Immature Granulocytes: 1 %
Lymphocytes Relative: 9 %
Lymphs Abs: 0.7 10*3/uL (ref 0.7–4.0)
MCH: 28.6 pg (ref 26.0–34.0)
MCHC: 31 g/dL (ref 30.0–36.0)
MCV: 92.4 fL (ref 80.0–100.0)
Monocytes Absolute: 0.7 10*3/uL (ref 0.1–1.0)
Monocytes Relative: 9 %
Neutro Abs: 6.1 10*3/uL (ref 1.7–7.7)
Neutrophils Relative %: 77 %
Platelet Count: 392 10*3/uL (ref 150–400)
RBC: 3.95 MIL/uL (ref 3.87–5.11)
RDW: 17.6 % — ABNORMAL HIGH (ref 11.5–15.5)
WBC Count: 7.8 10*3/uL (ref 4.0–10.5)
nRBC: 0 % (ref 0.0–0.2)
nRBC: 0 /100{WBCs}

## 2023-11-05 LAB — CMP (CANCER CENTER ONLY)
ALT: 19 U/L (ref 0–44)
AST: 26 U/L (ref 15–41)
Albumin: 3.6 g/dL (ref 3.5–5.0)
Alkaline Phosphatase: 90 U/L (ref 38–126)
Anion gap: 8 (ref 5–15)
BUN: 18 mg/dL (ref 6–20)
CO2: 29 mmol/L (ref 22–32)
Calcium: 9 mg/dL (ref 8.9–10.3)
Chloride: 101 mmol/L (ref 98–111)
Creatinine: 0.52 mg/dL (ref 0.44–1.00)
GFR, Estimated: >60 mL/min (ref >60)
Glucose, Bld: 150 mg/dL — ABNORMAL HIGH (ref 70–99)
Potassium: 4.1 mmol/L (ref 3.5–5.1)
Sodium: 138 mmol/L (ref 135–145)
Total Bilirubin: <0.2 mg/dL (ref 0.0–1.2)
Total Protein: 5.6 g/dL — ABNORMAL LOW (ref 6.5–8.1)

## 2023-11-05 LAB — SAMPLE TO BLOOD BANK

## 2023-11-05 MED ORDER — HEPARIN SOD (PORK) LOCK FLUSH 100 UNIT/ML IV SOLN
500.0000 [IU] | Freq: Once | INTRAVENOUS | Status: AC
Start: 2023-11-05 — End: 2023-11-05
  Administered 2023-11-05: 500 [IU] via INTRAVENOUS

## 2023-11-05 MED ORDER — SODIUM CHLORIDE FLUSH 0.9 % IV SOLN
10.0000 mL | INTRAVENOUS | Status: AC | PRN
Start: 2023-11-05 — End: ?
  Administered 2023-11-05: 10 mL via INTRAVENOUS

## 2023-11-05 NOTE — Addendum Note (Signed)
 Addended by: Pervis Hocking T on: 11/05/2023 09:46 AM   Modules accepted: Orders

## 2023-11-05 NOTE — Assessment & Plan Note (Signed)
 Mild right hand/arm tremor of uncertain etiology, with occasional blurry vision. The tremor is less pronounced. MRI brain in 09-18-2023 revealed multiple sclerotic bone lesions, but no intracranial abnormalities.

## 2023-11-08 ENCOUNTER — Encounter: Payer: Self-pay | Admitting: Oncology

## 2023-11-12 ENCOUNTER — Encounter: Payer: Self-pay | Admitting: Oncology

## 2023-11-15 ENCOUNTER — Inpatient Hospital Stay: Payer: 59

## 2023-11-15 ENCOUNTER — Inpatient Hospital Stay: Payer: 59 | Attending: Hematology and Oncology

## 2023-11-15 ENCOUNTER — Other Ambulatory Visit: Payer: Self-pay | Admitting: Oncology

## 2023-11-15 ENCOUNTER — Encounter: Payer: Self-pay | Admitting: Oncology

## 2023-11-15 ENCOUNTER — Inpatient Hospital Stay (HOSPITAL_BASED_OUTPATIENT_CLINIC_OR_DEPARTMENT_OTHER): Payer: 59 | Admitting: Oncology

## 2023-11-15 VITALS — BP 100/73 | HR 92 | Temp 98.4°F | Resp 18 | Ht 62.0 in | Wt 124.4 lb

## 2023-11-15 DIAGNOSIS — H538 Other visual disturbances: Secondary | ICD-10-CM | POA: Diagnosis not present

## 2023-11-15 DIAGNOSIS — Z5111 Encounter for antineoplastic chemotherapy: Secondary | ICD-10-CM | POA: Insufficient documentation

## 2023-11-15 DIAGNOSIS — D509 Iron deficiency anemia, unspecified: Secondary | ICD-10-CM | POA: Diagnosis not present

## 2023-11-15 DIAGNOSIS — M87180 Osteonecrosis due to drugs, jaw: Secondary | ICD-10-CM | POA: Diagnosis not present

## 2023-11-15 DIAGNOSIS — Z170 Estrogen receptor positive status [ER+]: Secondary | ICD-10-CM | POA: Insufficient documentation

## 2023-11-15 DIAGNOSIS — C50412 Malignant neoplasm of upper-outer quadrant of left female breast: Secondary | ICD-10-CM

## 2023-11-15 DIAGNOSIS — C7951 Secondary malignant neoplasm of bone: Secondary | ICD-10-CM

## 2023-11-15 DIAGNOSIS — R944 Abnormal results of kidney function studies: Secondary | ICD-10-CM | POA: Insufficient documentation

## 2023-11-15 DIAGNOSIS — Z5112 Encounter for antineoplastic immunotherapy: Secondary | ICD-10-CM | POA: Diagnosis not present

## 2023-11-15 DIAGNOSIS — R7989 Other specified abnormal findings of blood chemistry: Secondary | ICD-10-CM | POA: Insufficient documentation

## 2023-11-15 DIAGNOSIS — R251 Tremor, unspecified: Secondary | ICD-10-CM | POA: Diagnosis not present

## 2023-11-15 DIAGNOSIS — Z923 Personal history of irradiation: Secondary | ICD-10-CM | POA: Insufficient documentation

## 2023-11-15 DIAGNOSIS — Z9221 Personal history of antineoplastic chemotherapy: Secondary | ICD-10-CM | POA: Diagnosis not present

## 2023-11-15 LAB — CBC WITH DIFFERENTIAL (CANCER CENTER ONLY)
Abs Immature Granulocytes: 0.03 10*3/uL (ref 0.00–0.07)
Basophils Absolute: 0 10*3/uL (ref 0.0–0.1)
Basophils Relative: 1 %
Eosinophils Absolute: 0.3 10*3/uL (ref 0.0–0.5)
Eosinophils Relative: 5 %
HCT: 35.1 % — ABNORMAL LOW (ref 36.0–46.0)
Hemoglobin: 11 g/dL — ABNORMAL LOW (ref 12.0–15.0)
Immature Granulocytes: 1 %
Lymphocytes Relative: 11 %
Lymphs Abs: 0.7 10*3/uL (ref 0.7–4.0)
MCH: 28.4 pg (ref 26.0–34.0)
MCHC: 31.3 g/dL (ref 30.0–36.0)
MCV: 90.5 fL (ref 80.0–100.0)
Monocytes Absolute: 0.6 10*3/uL (ref 0.1–1.0)
Monocytes Relative: 9 %
Neutro Abs: 4.6 10*3/uL (ref 1.7–7.7)
Neutrophils Relative %: 73 %
Platelet Count: 301 10*3/uL (ref 150–400)
RBC: 3.88 MIL/uL (ref 3.87–5.11)
RDW: 16.9 % — ABNORMAL HIGH (ref 11.5–15.5)
WBC Count: 6.3 10*3/uL (ref 4.0–10.5)
nRBC: 0 % (ref 0.0–0.2)
nRBC: 0 /100{WBCs}

## 2023-11-15 LAB — CMP (CANCER CENTER ONLY)
ALT: 18 U/L (ref 0–44)
AST: 23 U/L (ref 15–41)
Albumin: 3.6 g/dL (ref 3.5–5.0)
Alkaline Phosphatase: 92 U/L (ref 38–126)
Anion gap: 9 (ref 5–15)
BUN: 21 mg/dL — ABNORMAL HIGH (ref 6–20)
CO2: 28 mmol/L (ref 22–32)
Calcium: 9.1 mg/dL (ref 8.9–10.3)
Chloride: 103 mmol/L (ref 98–111)
Creatinine: 0.49 mg/dL (ref 0.44–1.00)
GFR, Estimated: >60 mL/min (ref >60)
Glucose, Bld: 141 mg/dL — ABNORMAL HIGH (ref 70–99)
Potassium: 4.2 mmol/L (ref 3.5–5.1)
Sodium: 140 mmol/L (ref 135–145)
Total Bilirubin: <0.2 mg/dL (ref 0.0–1.2)
Total Protein: 5.7 g/dL — ABNORMAL LOW (ref 6.5–8.1)

## 2023-11-15 MED ORDER — AMOXICILLIN 500 MG PO CAPS: 500.0000 mg | ORAL_CAPSULE | Freq: Four times a day (QID) | ORAL | 0 refills | Status: DC

## 2023-11-15 MED ORDER — HEPARIN SOD (PORK) LOCK FLUSH 100 UNIT/ML IV SOLN
500.0000 [IU] | Freq: Once | INTRAVENOUS | Status: AC | PRN
  Administered 2023-11-15: 500 [IU]

## 2023-11-15 MED ORDER — SODIUM CHLORIDE 0.9% FLUSH
10.0000 mL | INTRAVENOUS | Status: AC | PRN
  Administered 2023-11-15: 10 mL

## 2023-11-15 NOTE — Progress Notes (Signed)
 [facility]  [address] [place],  [place]  [address] [phone]  Clinic Day:  11/15/23  Referring physician: Georgean Kindle, MD  ASSESSMENT & PLAN:  Assessment: Malignant neoplasm metastatic to bone [facility]) Bone metastases diagnosed in November 2022, with multiple marrow replacing lesions within the proximal right femur within the ischium as well as the inferior rami. 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 Ki67 was 60%. PET imaging in December revealed dominant finding of intensely hypermetabolic aggressive skeletal metastasis involving the calvarium, sternum, thoracic and lumbar spine, and pelvis, with potential pathologic fracture of the right femur with internal fixation. There was soft tissue extension into the anterior mediastinum associated with the manubrial metastasis. Multiple spinal vertebral body lesions had cortical destruction along the posterior margin. She started monthly Xgeva in December, and completed radiation therapy. She was receiving THP, but cycle 4 was delayed and dose reduction made by 20% due to left leg skin infection.    She continued with Herceptin/Perjeta until she had progression of disease. In January 2024, she developed increased pain of the left hip. She was found to have innumerable metastatic lesions throughout the pelvis and proximal femur. There is a non displaced pathologic fracture through a large lesion in the superior left acetabulum. She was referred to an orthopedic oncologist at [facility] and they recommended radiation and no surgery. She received her palliative radiation. She followed-up with [facility] in April and they do not recommend surgery, but will continue surveillance.    We then switched her to [facility] chemotherapy, but she has had significant toxicities of nausea, vomiting, diarrhea, and hypokalemia.  She was also placed on hormonal  therapy with Faslodex injections.  Repeat CT imaging in July revealed decreased soft tissue thickening around the left subclavian structures and thoracic inlet.  Left lateral breast mass was stable.  Widespread mixed lytic and sclerotic osseous metastatic lesions were stable.  There was unchanged severe elevation of the left hemidiaphragm.  She continues Xgeva for her bone metastases, which is being given every 6 weeks to coincide with chemotherapy.   CT neck in October to evaluate for possible soft tissue mass and painful swallowing did not reveal any soft tissue masses.  The extensive mixed lytic and sclerotic skeletal metastases were much more pronounced in the visible upper thorax than in the cervical spine and were felt to be grossly stable from July 2024. CT chest, abdomen and pelvis in November 2024 revealed stable bony disease without evidence of new/progressive disease.   She completed 18 cycles of Enhertu, which was then placed on hold due to severe anemia, with a precipitous drop in March of 2025.  She required multiple units of blood. She was found to be iron deficient and received IV iron in the form of Venofer for 5 doses. She was referred to Dr. Venice Gillis to consider evaluation of the iron deficiency and is scheduled to see him on June 19th.  Fortunately, her hemoglobin has stabilized at 11.0.   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  She was treated with lumpectomy followed by adjuvant chemotherapy and radiation therapy. She was on adjuvant hormonal therapy with tamoxifen 20 mg daily from October 2015 to May 2020, but due to elevation of the liver transaminases was switched to anastrazole.  She then developed bone  metastasis in November 2022 so has been receiving chemotherapy. She had skin thickening on her last mammogram, but a nodule in the left breast was stable on CT  imaging. The skin thickening is felt to possibly due to radiation, as her exam remained stable. Continued mammograms are not recommended.   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 She developed pain of the right jaw in February 2025.  Panorex x-rays revealed an abnormal area in the right lower jaw consistent with osteonecrosis.  Xgeva was discontinued.  She was treated with Augmentin in February.  She has been working closely with her dentist. She has had 2 fillings done, but also may need root canals.  She sees him again on April 8.  She had swelling and erythema of the left jaw earlier this month, so was placed on amoxicillin 4 times daily, with resolution of the swelling and erythema.  The redness and swelling have resolved, but she states she still has some mild tenderness of the anterior left jaw.  She will contact us  if she has recurrent swelling and erythema.   Iron deficiency anemia New iron deficiency anemia diagnosed in earlier this month, when she presented with acute worsening of her anemia. She required transfusion of 3 units of PRBC's. She then received IV iron in the form of Venofer for 5 doses. She is taking oral iron now.  She was referred to Dr. Venice Gillis for consideration of evaluation of the iron deficiency and will see him June 19th.  Her hemoglobin has stabilized at 11.0.   Tremor Mild right hand/arm tremor of uncertain etiology, with occasional blurry vision. The tremor is less pronounced. MRI brain in 2023/09/23 revealed multiple sclerotic bone lesions, but no intracranial abnormalities.    Plan:  Her Enhertu has been on hold for the last month due to severe anemia requiring transfusions and IV iron. She has another round of dental work coming up and I will refill her antibiotics for this. Patient has a echocardiogram scheduled for next week and informed me that she cannot see Dr. Venice Gillis until June 19th. She has a WBC of 6.3, stable low  hemoglobin of 11.0, and platelet count of 301,000. She has now completed her IV iron doses. Her CMP is normal other than a elevated BUN of 21 and low total protein of 5.7. We will continue to hold off on starting the Enhertu until her performance status improves. We will plan repeat CT scans and CA 27.29 later this Spring prior to resuming therapy. We discussed risks and benefits of resuming Enhertu versus going onto an alternative treatment. I will see her back in 2 weeks with CBC, CMP, and magnesium. The patient understands the plans discussed today and is in agreement with them.  She knows to contact our office if she develops concerns prior to her next appointment.  I provided 11 minutes of face-to-face time during this encounter and > 50% was spent counseling as documented under my assessment and plan.   Anna Baumgartner, MD [facility] DE[facility] OF [facility] [address] [place] [address] Dept: [phone] Dept Fax: [phone]   No orders of the defined types were placed in this encounter.   CHIEF COMPLAINT:  CC: Recurrent HER2 receptor positive breast cancer with bone metastasis  Current Treatment: On hold  HISTORY OF PRESENT ILLNESS:   Oncology History  Malignant neoplasm of upper-outer quadrant  of left female breast (HCC)  07/23/2013 Initial Diagnosis   Malignant neoplasm of upper-outer quadrant of left female breast (HCC)   07/23/2013 Cancer Staging   Staging form: Breast, AJCC 7th Edition - Clinical stage from 07/23/2013: Stage IIB (T2, N1, M0) - Signed by Anna Baumgartner, MD on 07/04/2020 Prognostic indicators: Pos LVI   09/22/2021 - 04/06/2022 Chemotherapy   Patient is on Treatment Plan : BREAST  Trastuzumab + Pertuzumab q21d      09/22/2021 - 09/21/2022 Chemotherapy   Patient is on Treatment Plan : BREAST Trastuzumab + Pertuzumab q21d     10/12/2022 -  Chemotherapy   Patient is on Treatment Plan :  BREAST METASTATIC Fam-Trastuzumab Deruxtecan-nxki (Enhertu) (5.4) q21d     Malignant neoplasm metastatic to bone (HCC)  06/28/2021 Initial Diagnosis   Bone metastases (HCC)   09/22/2021 - 04/06/2022 Chemotherapy   Patient is on Treatment Plan : BREAST  Trastuzumab + Pertuzumab q21d      09/22/2021 - 09/21/2022 Chemotherapy   Patient is on Treatment Plan : BREAST Trastuzumab + Pertuzumab q21d     05/11/2022 Imaging   CT chest, abdomen and pelvis:  IMPRESSION:  1. Widespread bony metastatic disease shows increased sclerosis  since previous imaging. This is likely related to response to  therapy in the interval. There is a single lesion along the LEFT  iliac which shows continued lytic change and warrants attention on  follow-up  2. Interval development of pathologic fractures at in the upper  thoracic spine and at the thoracolumbar junction with associated  kyphosis.  3. No signs of solid organ or nodal metastatic disease.  4. Marked elevation of the LEFT hemidiaphragm as on the prior PET.  5. Aortic atherosclerosis.    10/12/2022 -  Chemotherapy   Patient is on Treatment Plan : BREAST METASTATIC Fam-Trastuzumab Deruxtecan-nxki (Enhertu) (5.4) q21d       INTERVAL HISTORY:  Louiza is here today for repeat clinical assessment for her recurrent HER2 receptor positive breast cancer with bone metastasis. Her Enhertu has been on hold for the last month due to severe anemia requiring transfusions and IV iron. Patient states that she feels well but complains of lower back pain rating 4/10. She has another round of dental work coming up and I will refill her antibiotics for this. Patient has a echocardiogram scheduled for next week and informed me that she cannot see Dr. Venice Gillis until June 19th. She has a WBC of 6.3, stable low hemoglobin of 11.0, and platelet count of 301,000. She has now completed her IV iron doses. Her CMP is normal other than a elevated BUN of 21 and low total protein of 5.7. We  will continue to hold off on starting the Enhertu until her performance status improves. We will plan repeat CT scans and CA 27.29 later this Spring prior to resuming therapy. We discussed risks and benefits of resuming Enhertu versus going onto an alternative treatment. I will see her back in 2 weeks with CBC, CMP, and magnesium.   She denies signs of infection such as sore throat, sinus drainage, cough, or urinary symptoms.  She denies fevers or recurrent chills. She denies nausea, vomiting, chest pain, dyspnea or cough. Her appetite is ok and her weight has increased 3 pounds over last week . This patient is accompanied in the office by her husband.    REVIEW OF SYSTEMS:  Review of Systems  Constitutional: Negative.  Negative for appetite change, chills, diaphoresis, fatigue, fever and unexpected weight change.  HENT:  Negative.  Negative for hearing loss, lump/mass, mouth sores, nosebleeds, sore throat, tinnitus, trouble swallowing and voice change.   Eyes: Negative.  Negative for eye problems and icterus.  Respiratory: Negative.  Negative for chest tightness, cough, hemoptysis, shortness of breath and wheezing.   Cardiovascular: Negative.  Negative for chest pain, leg swelling and palpitations.  Gastrointestinal:  Positive for nausea. Negative for abdominal distention, abdominal pain, blood in stool, constipation, diarrhea, rectal pain and vomiting.  Endocrine: Negative.  Negative for hot flashes.  Genitourinary: Negative.  Negative for bladder incontinence, difficulty urinating, dyspareunia, dysuria, frequency, hematuria, menstrual problem, nocturia, pelvic pain, vaginal bleeding and vaginal discharge.   Musculoskeletal:  Positive for back pain (4/10). Negative for arthralgias, flank pain, gait problem, myalgias, neck pain and neck stiffness.  Skin: Negative.  Negative for itching, rash and wound.  Neurological:  Negative for dizziness, extremity weakness, gait problem, headaches,  light-headedness, numbness, seizures and speech difficulty.  Hematological: Negative.  Negative for adenopathy. Does not bruise/bleed easily.  Psychiatric/Behavioral: Negative.  Negative for confusion, decreased concentration, depression, sleep disturbance and suicidal ideas. The patient is not nervous/anxious.    \ VITALS:  Blood pressure 100/73, pulse 92, temperature 98.4 F (36.9 C), temperature source Oral, resp. rate 18, height 5\' 2"  (1.575 m), weight 124 lb 6.4 oz (56.4 kg), last menstrual period 08/18/2013, SpO2 98%.  Wt Readings from Last 3 Encounters:  11/15/23 124 lb 6.4 oz (56.4 kg)  11/05/23 121 lb 4.8 oz (55 kg)  11/01/23 120 lb 4 oz (54.5 kg)    Body mass index is 22.75 kg/m.  Performance status (ECOG): 2 - Symptomatic, <50% confined to bed  PHYSICAL EXAM:  Physical Exam Vitals and nursing note reviewed. Exam conducted with a chaperone present.  Constitutional:      General: She is not in acute distress.    Appearance: Normal appearance. She is normal weight. She is not ill-appearing, toxic-appearing or diaphoretic.  HENT:     Head: Normocephalic and atraumatic.     Right Ear: Tympanic membrane, ear canal and external ear normal. There is no impacted cerumen.     Left Ear: Tympanic membrane, ear canal and external ear normal.     Nose: Nose normal. No congestion or rhinorrhea.     Mouth/Throat:     Mouth: Mucous membranes are moist.     Pharynx: Oropharynx is clear. No oropharyngeal exudate or posterior oropharyngeal erythema.  Eyes:     General: No scleral icterus.       Right eye: No discharge.        Left eye: No discharge.     Extraocular Movements: Extraocular movements intact.     Conjunctiva/sclera: Conjunctivae normal.     Pupils: Pupils are equal, round, and reactive to light.  Neck:     Vascular: No carotid bruit.     Comments: Her neck is in chronic flexure with some degree of kyphosis Cardiovascular:     Rate and Rhythm: Normal rate and regular  rhythm.     Pulses: Normal pulses.     Heart sounds: Normal heart sounds. No murmur heard.    No friction rub. No gallop.  Pulmonary:     Effort: Pulmonary effort is normal. No respiratory distress.     Breath sounds: No stridor. Decreased breath sounds present. No wheezing, rhonchi or rales.  Chest:     Chest wall: No tenderness.     Comments: Breast exam is deferred. Abdominal:     General: Bowel sounds  are normal. There is no distension.     Palpations: Abdomen is soft. There is no hepatomegaly, splenomegaly or mass.     Tenderness: There is no abdominal tenderness. There is no right CVA tenderness, left CVA tenderness, guarding or rebound.     Hernia: No hernia is present.  Musculoskeletal:        General: No swelling, tenderness, deformity or signs of injury. Normal range of motion.     Cervical back: Normal range of motion and neck supple. No rigidity or tenderness.     Right lower leg: No edema.     Left lower leg: No edema.  Lymphadenopathy:     Cervical: No cervical adenopathy.     Upper Body:     Right upper body: No supraclavicular or axillary adenopathy.     Left upper body: No supraclavicular or axillary adenopathy.     Lower Body: No right inguinal adenopathy. No left inguinal adenopathy.  Skin:    General: Skin is warm and dry.     Coloration: Skin is not jaundiced or pale.     Findings: No bruising, erythema, lesion or rash.  Neurological:     General: No focal deficit present.     Mental Status: She is alert and oriented to person, place, and time. Mental status is at baseline.     Cranial Nerves: No cranial nerve deficit.     Sensory: No sensory deficit.     Motor: No weakness.     Coordination: Coordination normal.     Gait: Gait normal.     Deep Tendon Reflexes: Reflexes normal.  Psychiatric:        Mood and Affect: Mood normal.        Behavior: Behavior normal.        Thought Content: Thought content normal.        Judgment: Judgment normal.      LABS:      Latest Ref Rng & Units 11/15/2023    9:31 AM 11/05/2023    8:00 AM 10/29/2023    2:04 PM  CBC  WBC 4.0 - 10.5 K/uL 6.3  7.8  6.9   Hemoglobin 12.0 - 15.0 g/dL 40.9  81.1  91.4   Hematocrit 36.0 - 46.0 % 35.1  36.5  34.1   Platelets 150 - 400 K/uL 301  392  403       Latest Ref Rng & Units 11/15/2023    9:31 AM 11/05/2023    8:00 AM 10/29/2023    2:04 PM  CMP  Glucose 70 - 99 mg/dL 782  956  213   BUN 6 - 20 mg/dL 21  18  11    Creatinine 0.44 - 1.00 mg/dL 0.86  5.78  4.69   Sodium 135 - 145 mmol/L 140  138  140   Potassium 3.5 - 5.1 mmol/L 4.2  4.1  3.9   Chloride 98 - 111 mmol/L 103  101  103   CO2 22 - 32 mmol/L 28  29  26    Calcium 8.9 - 10.3 mg/dL 9.1  9.0  8.9   Total Protein 6.5 - 8.1 g/dL 5.7  5.6  5.6   Total Bilirubin 0.0 - 1.2 mg/dL <6.2  <9.5  0.3   Alkaline Phos 38 - 126 U/L 92  90  96   AST 15 - 41 U/L 23  26  20    ALT 0 - 44 U/L 18  19  14     Lab Results  Component Value Date   CEA1 2.2 07/20/2021   /  CEA  Date Value Ref Range Status  07/20/2021 2.2 0.0 - 4.7 ng/mL Final    Comment:    (NOTE)                             Nonsmokers          <3.9                             Smokers             <5.6 [employer] Electrochemiluminescence Immunoassay (ECLIA) Values obtained with different assay methods or kits cannot be used interchangeably.  Results cannot be interpreted as absolute evidence of the presence or absence of malignant disease. Performed At: [facility] [address] [place], [place] [id number] Pearlean Botts MD WU:[id number]    Lab Results  Component Value Date   TIBC 251 10/25/2023   TIBC 225 (L) 07/10/2023   TIBC 256 01/14/2023   FERRITIN 129 10/25/2023   FERRITIN 305 07/10/2023   FERRITIN 333 (H) 01/14/2023   IRONPCTSAT 8 (L) 10/25/2023   IRONPCTSAT 11 07/10/2023   IRONPCTSAT 16 01/14/2023   Lab Results  Component Value Date   FOLATE 5.6 (L) 10/25/2023   Lab Results  Component Value Date    VITAMINB12 317 10/25/2023   Lab Results  Component Value Date   LDH 143 10/25/2023   LDH 171 07/10/2023   STUDIES:  EXAM: 09/05/2023 CT CHEST, ABDOMEN, AND PELVIS WITH CONTRAST IMPRESSION: 1. Unchanged size of the mass in the left breast with overlying skin thickening. 2. No significant interval change in the widespread osseous metastatic disease involving the axial and proximal appendicular skeleton. 3. Similar soft tissue thickening about the left subclavian structures and thoracic inlet, possibly reflecting posttreatment change. Suggest continued attention on follow-up imaging. 4. No evidence of new or progressive disease within the chest, abdomen or pelvis. 5.  Aortic Atherosclerosis (ICD10-I70.0).   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   Bipolar disorder (HCC) 11/29/2020   Breast cancer (HCC)    Cancer (HCC)    Cardiac murmur    Chest pain    Chronic fatigue syndrome 11/29/2020   Depression    Essential hypertension 11/29/2020   Hepatic steatosis determined by biopsy of liver 07/04/2020   HLD (hyperlipidemia)    Hypertension    Hypomagnesemia 03/26/2023   Hypothyroidism 11/29/2020   Insomnia 11/29/2020   Iron deficiency anemia 06/05/2022   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   Migraine 11/29/2020   Mild intermittent asthma 11/29/2020   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   Mixed hyperlipidemia 11/29/2020   Obesity due to excess calories    Osteopenia after menopause 07/04/2020   Other long term (current) drug therapy 11/29/2020   Other vitamin B12 deficiency anemias 11/29/2020   Personal history of malignant neoplasm of breast 11/29/2020   Pre-diabetes    Renal cyst, acquired, right  07/04/2020   14.5 cm in October 2021   Retrocalcaneal bursitis (back of heel), left 04/22/2018   Seasonal allergic rhinitis 11/29/2020   Tightness of heel cord, left 04/22/2018   Tremor 08/02/2023  Type 2 diabetes mellitus without complications (HCC) 11/29/2020   Vitamin D deficiency 11/29/2020    Past Surgical History:  Procedure Laterality Date   CESAREAN SECTION     IR BONE TUMOR(S)RF ABLATION  05/29/2022   IR BONE TUMOR(S)RF ABLATION  05/29/2022   IR KYPHO EA ADDL LEVEL THORACIC OR LUMBAR  05/29/2022   IR KYPHO THORACIC WITH BONE BIOPSY  05/29/2022   IR RADIOLOGIST EVAL & MGMT  05/22/2022   IR RADIOLOGIST EVAL & MGMT  06/12/2022   LIVER BIOPSY  01/2019   lumpectomy  2014   Left Breast   PORT-A-CATH REMOVAL     PORTACATH PLACEMENT  08/28/2013   STOMACH SURGERY     Tummy Tuck   TOTAL HIP ARTHROPLASTY Right 07/04/2021   Procedure: TOTAL HIP ARTHROPLASTY;  Surgeon: Claiborne Crew, MD;  Location: [facility];  Service: Orthopedics;  Laterality: Right;  90   WISDOM TOOTH EXTRACTION      Family History  Problem Relation Age of Onset   Hyperlipidemia Mother    Diabetes Father    Stroke Father    Colon cancer Neg Hx    Colon polyps Neg Hx    Esophageal cancer Neg Hx    Rectal cancer Neg Hx    Stomach cancer Neg Hx     Social History:  reports that she quit smoking about 21 years ago. Her smoking use included cigarettes. She has never used smokeless tobacco. She reports current alcohol use. She reports that she does not use drugs.The patient is accompanied by her husband today.  Allergies:  Allergies  Allergen Reactions   Reglan [Metoclopramide]     Made patient feel really funny when taking this medication    Xgeva [Denosumab] Other (See Comments)    Possible ONJ (09/13/23)    Current Medications: Current Outpatient Medications  Medication Sig Dispense Refill   albuterol (VENTOLIN HFA) 108 (90 Base) MCG/ACT inhaler Inhale 2 puffs into the lungs every 6 (six) hours as  needed for shortness of breath.     amoxicillin (AMOXIL) 500 MG capsule Take 1 capsule (500 mg total) by mouth in the morning, at noon, in the evening, and at bedtime. 40 capsule 0   ARIPiprazole (ABILIFY) 5 MG tablet Take 2.5 mg by mouth daily.     aspirin 81 MG EC tablet Take 81 mg by mouth daily. Swallow whole.     atorvastatin (LIPITOR) 20 MG tablet TAKE ONE TABLET BY MOUTH EVERY DAY 90 tablet 1   augmented betamethasone dipropionate (DIPROLENE-AF) 0.05 % cream      dapagliflozin propanediol (FARXIGA) 10 MG TABS tablet TAKE ONE TABLET BY MOUTH EVERY DAY 30 tablet 10   desvenlafaxine (PRISTIQ) 50 MG 24 hr tablet Take 50 mg by mouth daily.     dexamethasone (DECADRON) 4 MG tablet TAKE ONE TABLET BY MOUTH 2 TIMES A DAY FOR 5 DAYS AFTER CHEMOTHERAPY 20 tablet 2   diphenoxylate-atropine (LOMOTIL) 2.5-0.025 MG tablet TAKE TWO TABLETS BY MOUTH FOUR TIMES A DAY AS NEEDED FOR DIARRHEA OR LOOSE STOOLS 100 tablet 1   folic acid (FOLVITE) 1 MG tablet Take 1 tablet (1 mg total) by mouth daily. 30 tablet 5   labetalol (NORMODYNE) 100 MG tablet Take 1 tablet (100 mg total) by mouth 2 (two) times daily. 180 tablet 3   loratadine (CLARITIN) 10 MG tablet TAKE ONE TABLET BY MOUTH EVERY DAY 30 tablet 2   LORazepam (ATIVAN) 1 MG tablet TAKE ONE TABLET BY MOUTH EVERY 8 HOURS AS NEEDED FOR  ANXIETY   NAUSEA  OR VOMITING 30 tablet 0   magic mouthwash (lidocaine, diphenhydrAMINE, alum & mag hydroxide) suspension Take 5 mLs by mouth every 3 (three) hours as needed. Swish and spit; for throat/mouth pain     naloxone (NARCAN) nasal spray 4 mg/0.1 mL One spray in nostril as needed, may repeat every 2 to 3 minutes until medical assistance becomes available 4 each 1   nitroGLYCERIN (NITROSTAT) 0.4 MG SL tablet Place 0.4 mg under the tongue every 5 (five) minutes as needed for chest pain.     nystatin (MYCOSTATIN/NYSTOP) powder Apply 1 Application topically 2 (two) times daily.     omeprazole (PRILOSEC) 40 MG capsule Take 1  capsule (40 mg total) by mouth at bedtime. 30 capsule 5   ondansetron (ZOFRAN) 8 MG tablet Take 1 tablet (8 mg total) by mouth every 8 (eight) hours as needed for nausea or vomiting. 60 tablet 5   Oxycodone HCl 10 MG TABS Take 1 tablet (10 mg total) by mouth every 4 (four) hours as needed. for pain 180 tablet 0   potassium chloride SA (KLOR-CON M) 20 MEQ tablet Take 1 tablet (20 mEq total) by mouth 2 (two) times daily. (Patient taking differently: Take 20 mEq by mouth 2 (two) times daily. Taking potasium citrate 1000 mg per two gummies) 60 tablet 1   prochlorperazine (COMPAZINE) 10 MG tablet TAKE ONE TABLET BY MOUTH EVERY 6 HOURS AS NEEDED FOR NAUSEA/VOMITING 90 tablet 3   Current Facility-Administered Medications  Medication Dose Route Frequency Provider Last Rate Last Admin   technetium sestamibi generic (CARDIOLITE) injection 10.5 millicurie  10.5 millicurie Intravenous Once PRN Revankar, Rajan R, MD       Facility-Administered Medications Ordered in Other Visits  Medication Dose Route Frequency Provider Last Rate Last Admin   sodium chloride flush (NS) 0.9 % injection 10 mL  10 mL Intracatheter PRN Mosher, Kelli A, PA-C   10 mL at 11/15/23 1111   sodium chloride flush 0.9 % injection 10 mL  10 mL Intravenous PRN Anna Baumgartner, MD   10 mL at 11/05/23 2956    I,Jasmine M Lassiter,acting as a scribe for Anna Baumgartner, MD.,have documented all relevant documentation on the behalf of Anna Baumgartner, MD,as directed by  Anna Baumgartner, MD while in the presence of Anna Baumgartner, MD.

## 2023-11-15 NOTE — Addendum Note (Signed)
 Addended by: Benjaman Lobe E on: 11/15/2023 11:19 AM   Modules accepted: Orders

## 2023-11-18 ENCOUNTER — Other Ambulatory Visit: Payer: Self-pay | Admitting: Hematology and Oncology

## 2023-11-18 DIAGNOSIS — F411 Generalized anxiety disorder: Secondary | ICD-10-CM

## 2023-11-18 DIAGNOSIS — G4700 Insomnia, unspecified: Secondary | ICD-10-CM

## 2023-11-19 ENCOUNTER — Ambulatory Visit

## 2023-11-19 ENCOUNTER — Other Ambulatory Visit: Payer: Self-pay

## 2023-11-25 ENCOUNTER — Encounter: Payer: Self-pay | Admitting: Oncology

## 2023-11-26 NOTE — Progress Notes (Signed)
 [facility]  [address] [place],  [place]  [address] [phone]  Clinic Day:  11/27/23  Referring physician: Georgean Kindle, MD  ASSESSMENT & PLAN:  Assessment: Malignant neoplasm metastatic to bone [facility]) Bone metastases diagnosed in November 2022, with multiple marrow replacing lesions within the proximal right femur within the ischium as well as the inferior rami. 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 Ki67 was 60%. PET imaging in December revealed dominant finding of intensely hypermetabolic aggressive skeletal metastasis involving the calvarium, sternum, thoracic and lumbar spine, and pelvis, with potential pathologic fracture of the right femur with internal fixation. There was soft tissue extension into the anterior mediastinum associated with the manubrial metastasis. Multiple spinal vertebral body lesions had cortical destruction along the posterior margin. She started monthly Xgeva  in December, and completed radiation therapy. She was receiving THP, but cycle 4 was delayed and dose reduction made by 20% due to left leg skin infection.    She continued with Herceptin /Perjeta  until she had progression of disease. In January 2024, she developed increased pain of the left hip. She was found to have innumerable metastatic lesions throughout the pelvis and proximal femur. There is a non displaced pathologic fracture through a large lesion in the superior left acetabulum. She was referred to an orthopedic oncologist at [facility] and they recommended radiation and no surgery. She received her palliative radiation. She followed-up with [facility] in April and they do not recommend surgery, but will continue surveillance.    We then switched her to Enhertu  chemotherapy, but she has had significant toxicities of nausea, vomiting, diarrhea, and hypokalemia.  She was also placed on hormonal  therapy with Faslodex  injections.  Repeat CT imaging in July revealed decreased soft tissue thickening around the left subclavian structures and thoracic inlet.  Left lateral breast mass was stable.  Widespread mixed lytic and sclerotic osseous metastatic lesions were stable.  There was unchanged severe elevation of the left hemidiaphragm.    CT neck in October did not reveal any soft tissue masses.  The extensive mixed lytic and sclerotic skeletal metastases were much more pronounced in the visible upper thorax than in the cervical spine and were felt to be grossly stable from July 2024. CT chest, abdomen and pelvis in November 2024 revealed stable bony disease without evidence of new/progressive disease. She completed 18 cycles of Enhertu , which was then placed on hold due to severe anemia, with a precipitous drop in March of 2025.  She required multiple units of blood. She was found to be iron  deficient and received IV iron  in the form of Venofer  for 5 doses. She was referred to Dr. Venice Gillis to consider evaluation of the iron  deficiency and is scheduled to see him on June 19th.  Fortunately, her hemoglobin has improved to 12.0 and so we will resume the Enhertu  to see how she tolerates this. We will plan repeat CT scans in May or June.    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  She was treated with lumpectomy followed by adjuvant chemotherapy and radiation therapy. She was on adjuvant hormonal therapy with tamoxifen 20 mg daily from October 2015 to May 2020, but due to elevation of the liver transaminases was switched to anastrazole.  She then developed bone metastasis in November 2022 so has  been receiving chemotherapy. She had skin thickening on her last mammogram, but a nodule in the left breast was stable on CT imaging. The skin thickening is felt to possibly due to radiation, as her  exam remained stable. Continued mammograms are not recommended.   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 She developed pain of the right jaw in February 2025.  Panorex x-rays revealed an abnormal area in the right lower jaw consistent with osteonecrosis.  Xgeva  was discontinued.  She was treated with Augmentin  in February.  She has been working closely with her dentist. She has had 2 fillings done, but also may need root canals.  She sees him again on April 8.  She had swelling and erythema of the left jaw earlier this month, so was placed on amoxicillin  4 times daily, with resolution of the swelling and erythema.  The redness and swelling have resolved, but she states she still has some mild tenderness of the anterior left jaw.  She will contact us  if she has recurrent swelling and erythema.   Iron  deficiency anemia New iron  deficiency anemia diagnosed in March, when she presented with acute worsening of her anemia. She required transfusion of 3 units of PRBC's. She then received IV iron  in the form of Venofer  for 5 doses. She is taking oral iron  now.  She was referred to Dr. Venice Gillis for consideration of evaluation of the iron  deficiency and will see him June 19th.  Her hemoglobin has improved at 12.0.    Tremor Mild right hand/arm tremor of uncertain etiology, with occasional blurry vision. The tremor is less pronounced. MRI brain in 12-Sep-2023 revealed multiple sclerotic bone lesions, but no intracranial abnormalities.    Plan:  She has one more session of dental work on May 8th where they will address 6 cavities. She continues amoxicillin . The Xgeva  has been stopped due to osteonecrosis of the jaw. She has an echocardiogram scheduled on that same day. She continues to use Milk of Magnesia to keep her bowels regular.  She has a WBC of 6.5, hemoglobin of 12.0 improved from 11.0, and a platelet count of 310,000. Her CMP is normal other than a elevated BUN of 24  and low total protein of 6.1 but improved from 5.7. Her magnesium  is normal at 2.0. I will order 1L of normal saline IV for today. We will resume her Enhertu  today to see how she tolerates this and will recheck her blood work when she returns. She missed her Faslodex  injections in March and is due for her faslodex  injection today. I will see her back in 10 days with CBC and CMP. She will then get her next Enhertu  in 4 weeks because we will post-pone it due to the dental work, she will be due for her Faslodex  injection again also. I will repeat a CA 27.29 in 4 weeks and plan repeat CT scans later this Spring. The patient understands the plans discussed today and is in agreement with them.  She knows to contact our office if she develops concerns prior to her next appointment.  I provided 33 minutes of face-to-face time during this encounter and > 50% was spent counseling as documented under my assessment and plan.   Nolia Baumgartner, MD [facility] - A DEPT OF [facility] [address] [place] [address] Dept: [phone] Dept Fax: [phone]   No orders of the defined  types were placed in this encounter.   CHIEF COMPLAINT:  CC: Recurrent HER2 receptor positive breast cancer with bone metastasis  Current Treatment: On hold  HISTORY OF PRESENT ILLNESS:   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 (HCC)   07/23/2013 Cancer Staging   Staging form: Breast, AJCC 7th Edition - Clinical stage from 07/23/2013: Stage IIB (T2, N1, M0) - Signed by Nolia Baumgartner, MD on 07/04/2020 Prognostic indicators: Pos LVI   09/22/2021 - 04/06/2022 Chemotherapy   Patient is on Treatment Plan : BREAST  Trastuzumab  + Pertuzumab  q21d      09/22/2021 - 09/21/2022 Chemotherapy   Patient is on Treatment Plan  : BREAST Trastuzumab  + Pertuzumab  q21d     10/12/2022 -  Chemotherapy   Patient is on Treatment Plan : BREAST METASTATIC Fam-Trastuzumab Deruxtecan-nxki  (Enhertu ) (5.4) q21d     Malignant neoplasm metastatic to bone (HCC)  06/28/2021 Initial Diagnosis   Bone metastases (HCC)   09/22/2021 - 04/06/2022 Chemotherapy   Patient is on Treatment Plan : BREAST  Trastuzumab  + Pertuzumab  q21d      09/22/2021 - 09/21/2022 Chemotherapy   Patient is on Treatment Plan : BREAST Trastuzumab  + Pertuzumab  q21d     05/11/2022 Imaging   CT chest, abdomen and pelvis:  IMPRESSION:  1. Widespread bony metastatic disease shows increased sclerosis  since previous imaging. This is likely related to response to  therapy in the interval. There is a single lesion along the LEFT  iliac which shows continued lytic change and warrants attention on  follow-up  2. Interval development of pathologic fractures at in the upper  thoracic spine and at the thoracolumbar junction with associated  kyphosis.  3. No signs of solid organ or nodal metastatic disease.  4. Marked elevation of the LEFT hemidiaphragm as on the prior PET.  5. Aortic atherosclerosis.    10/12/2022 -  Chemotherapy   Patient is on Treatment Plan : BREAST METASTATIC Fam-Trastuzumab Deruxtecan-nxki  (Enhertu ) (5.4) q21d       INTERVAL HISTORY:  Anna [name] is here today for repeat clinical assessment for her recurrent HER2 receptor positive breast cancer with bone metastasis. Her Enhertu  has been on hold for the last month due to severe anemia requiring transfusions and IV iron . Patient states that she feels ok but complains of lower back pain rating a 5/10 and teeth pain from recent dental work. She has one more session of dental work on May 8th where they will address 6 cavities. She continues amoxicillin . The Xgeva  has been stopped due to osteonecrosis of the jaw. She has an echocardiogram scheduled on 12/19/23.  She continues to use Milk of Magnesia to keep her  bowels regular.  She has a WBC of 6.5, hemoglobin of 12.0 improved from 11.0, and a platelet count of 310,000. Her CMP is normal other than a elevated BUN of 24 and low total protein of 6.1 but improved from 5.7. Her magnesium  is normal at 2.0. I will order 1L of normal saline IV for today. We will resume her Enhertu  today to see how she tolerates this and will recheck her blood work when she returns. She missed her Faslodex  injections in March and is due for her faslodex  injection today. I will see her back in 10 days with CBC and CMP. She will then get her next Enhertu  in 4 weeks because we will post-pone it due  to the dental work, she will be due for her Faslodex  injection again also. I will repeat a CA 27.29 in 4 weeks and plan repeat CT scans in May or June. She denies signs of infection such as sore throat, sinus drainage, cough, or urinary symptoms.  She denies fevers or recurrent chills.  She denies nausea, vomiting, chest pain, dyspnea or cough. Her appetite is ok and her weight has been stable. This patient is accompanied in the office by her husband.    REVIEW OF SYSTEMS:  Review of Systems  Constitutional:  Positive for appetite change (improved). Negative for chills, diaphoresis, fatigue, fever and unexpected weight change.  HENT:  Negative.  Negative for hearing loss, lump/mass, mouth sores, nosebleeds, sore throat, tinnitus, trouble swallowing and voice change.        Teeth pain from dental work.  Eyes: Negative.  Negative for eye problems and icterus.  Respiratory: Negative.  Negative for chest tightness, cough, hemoptysis, shortness of breath and wheezing.   Cardiovascular: Negative.  Negative for chest pain, leg swelling and palpitations.  Gastrointestinal: Negative.  Negative for abdominal distention, abdominal pain, blood in stool, constipation, diarrhea, nausea, rectal pain and vomiting.  Endocrine: Negative.  Negative for hot flashes.  Genitourinary: Negative.  Negative for bladder  incontinence, difficulty urinating, dyspareunia, dysuria, frequency, hematuria, menstrual problem, nocturia, pelvic pain, vaginal bleeding and vaginal discharge.   Musculoskeletal:  Positive for back pain (lower, 5/10). Negative for arthralgias, flank pain, gait problem, myalgias, neck pain and neck stiffness.  Skin: Negative.  Negative for itching, rash and wound.  Neurological: Negative.  Negative for dizziness, extremity weakness, gait problem, headaches, light-headedness, numbness, seizures and speech difficulty.  Hematological: Negative.  Negative for adenopathy. Does not bruise/bleed easily.  Psychiatric/Behavioral: Negative.  Negative for confusion, decreased concentration, depression, sleep disturbance and suicidal ideas. The patient is not nervous/anxious.    \ VITALS:  Blood pressure 91/60, pulse 95, temperature 98.2 F (36.8 C), temperature source Oral, resp. rate 18, height 5\' 2"  (1.575 m), weight 124 lb 12.8 oz (56.6 kg), last menstrual period 08/18/2013, SpO2 96%.  Wt Readings from Last 3 Encounters:  12/06/23 123 lb 14.4 oz (56.2 kg)  11/27/23 124 lb 12.8 oz (56.6 kg)  11/15/23 124 lb 6.4 oz (56.4 kg)    Body mass index is 22.83 kg/m.  Performance status (ECOG): 1 - Symptomatic but completely ambulatory  PHYSICAL EXAM:  Physical Exam Vitals and nursing note reviewed. Exam conducted with a chaperone present.  Constitutional:      General: She is not in acute distress.    Appearance: Normal appearance. She is normal weight. She is not ill-appearing, toxic-appearing or diaphoretic.  HENT:     Head: Normocephalic and atraumatic.     Right Ear: Tympanic membrane, ear canal and external ear normal. There is no impacted cerumen.     Left Ear: Tympanic membrane, ear canal and external ear normal.     Nose: Nose normal. No congestion or rhinorrhea.     Mouth/Throat:     Mouth: Mucous membranes are moist.     Pharynx: Oropharynx is clear. No oropharyngeal exudate or posterior  oropharyngeal erythema.  Eyes:     General: No scleral icterus.       Right eye: No discharge.        Left eye: No discharge.     Extraocular Movements: Extraocular movements intact.     Conjunctiva/sclera: Conjunctivae normal.     Pupils: Pupils are equal, round, and reactive to light.  Neck:     Vascular: No carotid bruit.     Comments: Her neck is in chronic flexure with some degree of kyphosis Cardiovascular:     Rate and Rhythm: Normal rate and regular rhythm.     Pulses: Normal pulses.     Heart sounds: Normal heart sounds. No murmur heard.    No friction rub. No gallop.  Pulmonary:     Effort: Pulmonary effort is normal. No respiratory distress.     Breath sounds: No stridor. No decreased breath sounds, wheezing, rhonchi or rales.  Chest:     Chest wall: No tenderness.     Comments: Breast exam is deferred. Abdominal:     General: Bowel sounds are normal. There is no distension.     Palpations: Abdomen is soft. There is no hepatomegaly, splenomegaly or mass.     Tenderness: There is no abdominal tenderness. There is no right CVA tenderness, left CVA tenderness, guarding or rebound.     Hernia: No hernia is present.  Musculoskeletal:        General: No swelling, tenderness, deformity or signs of injury. Normal range of motion.     Cervical back: Normal range of motion and neck supple. No rigidity or tenderness.     Right lower leg: No edema.     Left lower leg: No edema.  Lymphadenopathy:     Cervical: No cervical adenopathy.     Upper Body:     Right upper body: No supraclavicular or axillary adenopathy.     Left upper body: No supraclavicular or axillary adenopathy.     Lower Body: No right inguinal adenopathy. No left inguinal adenopathy.  Skin:    General: Skin is warm and dry.     Coloration: Skin is not jaundiced or pale.     Findings: No bruising, erythema, lesion or rash.  Neurological:     General: No focal deficit present.     Mental Status: She is alert  and oriented to person, place, and time. Mental status is at baseline.     Cranial Nerves: No cranial nerve deficit.     Sensory: No sensory deficit.     Motor: No weakness.     Coordination: Coordination normal.     Gait: Gait normal.     Deep Tendon Reflexes: Reflexes normal.  Psychiatric:        Mood and Affect: Mood normal.        Behavior: Behavior normal.        Thought Content: Thought content normal.        Judgment: Judgment normal.     LABS:      Latest Ref Rng & Units 12/06/2023    8:45 AM 11/27/2023    8:14 AM 11/15/2023    9:31 AM  CBC  WBC 4.0 - 10.5 K/uL 6.6  6.5  6.3   Hemoglobin 12.0 - 15.0 g/dL 95.2  84.1  32.4   Hematocrit 36.0 - 46.0 % 38.2  38.1  35.1   Platelets 150 - 400 K/uL 288  310  301       Latest Ref Rng & Units 12/06/2023    8:45 AM 11/27/2023    8:14 AM 11/15/2023    9:31 AM  CMP  Glucose 70 - 99 mg/dL 401  027  253   BUN 6 - 20 mg/dL 26  24  21    Creatinine 0.44 - 1.00 mg/dL 6.64  4.03  4.74   Sodium 135 - 145 mmol/L  138  141  140   Potassium 3.5 - 5.1 mmol/L 3.9  4.4  4.2   Chloride 98 - 111 mmol/L 101  103  103   CO2 22 - 32 mmol/L 29  29  28    Calcium  8.9 - 10.3 mg/dL 9.2  9.7  9.1   Total Protein 6.5 - 8.1 g/dL 6.1  6.1  5.7   Total Bilirubin 0.0 - 1.2 mg/dL 0.2  <6.5  <7.8   Alkaline Phos 38 - 126 U/L 91  91  92   AST 15 - 41 U/L 19  20  23    ALT 0 - 44 U/L 19  15  18     Lab Results  Component Value Date   CEA1 2.2 07/20/2021   /  CEA  Date Value Ref Range Status  07/20/2021 2.2 0.0 - 4.7 ng/mL Final    Comment:    (NOTE)                             Nonsmokers          <3.9                             Smokers             <5.6 [employer] Electrochemiluminescence Immunoassay (ECLIA) Values obtained with different assay methods or kits cannot be used interchangeably.  Results cannot be interpreted as absolute evidence of the presence or absence of malignant disease. Performed At: [facility] [address]  [place], [place] [id number] Pearlean Botts MD UX:[id number]    Lab Results  Component Value Date   TIBC 251 10/25/2023   TIBC 225 (L) 07/10/2023   TIBC 256 01/14/2023   FERRITIN 129 10/25/2023   FERRITIN 305 07/10/2023   FERRITIN 333 (H) 01/14/2023   IRONPCTSAT 8 (L) 10/25/2023   IRONPCTSAT 11 07/10/2023   IRONPCTSAT 16 01/14/2023   Lab Results  Component Value Date   FOLATE 5.6 (L) 10/25/2023   Lab Results  Component Value Date   VITAMINB12 317 10/25/2023   Lab Results  Component Value Date   LDH 143 10/25/2023   LDH 171 07/10/2023   STUDIES:  EXAM: 09/05/2023 CT CHEST, ABDOMEN, AND PELVIS WITH CONTRAST IMPRESSION: 1. Unchanged size of the mass in the left breast with overlying skin thickening. 2. No significant interval change in the widespread osseous metastatic disease involving the axial and proximal appendicular skeleton. 3. Similar soft tissue thickening about the left subclavian structures and thoracic inlet, possibly reflecting posttreatment change. Suggest continued attention on follow-up imaging. 4. No evidence of new or progressive disease within the chest, abdomen or pelvis. 5.  Aortic Atherosclerosis (ICD10-I70.0).   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   Bipolar disorder (HCC) 11/29/2020   Breast cancer (HCC)    Cancer (HCC)    Cardiac murmur    Chest pain    Chronic fatigue syndrome 11/29/2020   Depression    Essential hypertension 11/29/2020   Hepatic steatosis determined by biopsy of liver 07/04/2020   HLD (hyperlipidemia)    Hypertension    Hypomagnesemia 03/26/2023   Hypothyroidism 11/29/2020   Insomnia 11/29/2020   Iron  deficiency anemia 06/05/2022  Malignant neoplasm of upper-outer quadrant of left female breast (HCC)  07/23/2013   Migraine 11/29/2020   Mild intermittent asthma 11/29/2020   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   Mixed hyperlipidemia 11/29/2020   Obesity due to excess calories    Osteopenia after menopause 07/04/2020   Other long term (current) drug therapy 11/29/2020   Other vitamin B12 deficiency anemias 11/29/2020   Personal history of malignant neoplasm of breast 11/29/2020   Pre-diabetes    Renal cyst, acquired, right 07/04/2020   14.5 cm in October 2021   Retrocalcaneal bursitis (back of heel), left 04/22/2018   Seasonal allergic rhinitis 11/29/2020   Tightness of heel cord, left 04/22/2018   Tremor 08/02/2023   Type 2 diabetes mellitus without complications (HCC) 11/29/2020   Vitamin D  deficiency 11/29/2020    Past Surgical History:  Procedure Laterality Date   CESAREAN SECTION     IR BONE TUMOR(S)RF ABLATION  05/29/2022   IR BONE TUMOR(S)RF ABLATION  05/29/2022   IR KYPHO EA ADDL LEVEL THORACIC OR LUMBAR  05/29/2022   IR KYPHO THORACIC WITH BONE BIOPSY  05/29/2022   IR RADIOLOGIST EVAL & MGMT  05/22/2022   IR RADIOLOGIST EVAL & MGMT  06/12/2022   LIVER BIOPSY  01/2019   lumpectomy  2014   Left Breast   PORT-A-CATH REMOVAL     PORTACATH PLACEMENT  08/28/2013   STOMACH SURGERY     Tummy Tuck   TOTAL HIP ARTHROPLASTY Right 07/04/2021   Procedure: TOTAL HIP ARTHROPLASTY;  Surgeon: Claiborne Crew, MD;  Location: [facility];  Service: Orthopedics;  Laterality: Right;  90   WISDOM TOOTH EXTRACTION      Family History  Problem Relation Age of Onset   Hyperlipidemia Mother    Diabetes Father    Stroke Father    Colon cancer Neg Hx    Colon polyps Neg Hx    Esophageal cancer Neg Hx    Rectal cancer Neg Hx    Stomach cancer Neg Hx     Social History:  reports that she quit smoking about 21 years ago. Her smoking use included cigarettes. She has never used smokeless tobacco. She reports current alcohol use. She reports that she does not use drugs.The patient  is accompanied by her husband today.  Allergies:  Allergies  Allergen Reactions   Reglan  [Metoclopramide ]     Made patient feel really funny when taking this medication    Xgeva  [Denosumab ] Other (See Comments)    Possible ONJ (09/13/23)    Current Medications: Current Outpatient Medications  Medication Sig Dispense Refill   albuterol  (VENTOLIN  HFA) 108 (90 Base) MCG/ACT inhaler Inhale 2 puffs into the lungs every 6 (six) hours as needed for shortness of breath.     amoxicillin  (AMOXIL ) 500 MG capsule Take 1 capsule (500 mg total) by mouth in the morning, at noon, in the evening, and at bedtime. 40 capsule 0   ARIPiprazole  (ABILIFY ) 5 MG tablet Take 2.5 mg by mouth daily.     aspirin 81 MG EC tablet Take 81 mg by mouth daily. Swallow whole.     atorvastatin  (LIPITOR) 20 MG tablet TAKE ONE TABLET BY MOUTH EVERY DAY 90 tablet 1   augmented betamethasone dipropionate (DIPROLENE-AF) 0.05 % cream      dapagliflozin  propanediol (FARXIGA ) 10 MG TABS tablet TAKE ONE TABLET BY MOUTH EVERY DAY 30 tablet 10   desvenlafaxine  (PRISTIQ ) 50 MG 24 hr tablet Take 50 mg by mouth daily.     dexamethasone  (DECADRON )  4 MG tablet TAKE ONE TABLET BY MOUTH 2 TIMES A DAY FOR 5 DAYS AFTER CHEMOTHERAPY 20 tablet 2   diphenoxylate -atropine  (LOMOTIL ) 2.5-0.025 MG tablet TAKE TWO TABLETS BY MOUTH FOUR TIMES A DAY AS NEEDED FOR DIARRHEA OR LOOSE STOOLS 100 tablet 1   folic acid  (FOLVITE ) 1 MG tablet Take 1 tablet (1 mg total) by mouth daily. 30 tablet 5   labetalol  (NORMODYNE ) 100 MG tablet Take 1 tablet (100 mg total) by mouth 2 (two) times daily. 180 tablet 3   loratadine  (CLARITIN ) 10 MG tablet TAKE ONE TABLET BY MOUTH EVERY DAY 30 tablet 2   LORazepam  (ATIVAN ) 1 MG tablet TAKE ONE TABLET BY MOUTH EVERY 8 HOURS AS NEEDED FOR ANXIETY   NAUSEA  OR VOMITING 30 tablet 0   magic mouthwash (lidocaine , diphenhydrAMINE , alum & mag hydroxide) suspension Take 5 mLs by mouth every 3 (three) hours as needed. Swish and spit; for  throat/mouth pain     naloxone  (NARCAN ) nasal spray 4 mg/0.1 mL One spray in nostril as needed, may repeat every 2 to 3 minutes until medical assistance becomes available 4 each 1   nitroGLYCERIN  (NITROSTAT ) 0.4 MG SL tablet Place 0.4 mg under the tongue every 5 (five) minutes as needed for chest pain.     nystatin  (MYCOSTATIN /NYSTOP ) powder Apply 1 Application topically 2 (two) times daily.     omeprazole  (PRILOSEC) 40 MG capsule Take 1 capsule (40 mg total) by mouth at bedtime. 30 capsule 5   ondansetron  (ZOFRAN ) 8 MG tablet Take 1 tablet (8 mg total) by mouth every 8 (eight) hours as needed for nausea or vomiting. 60 tablet 5   Oxycodone  HCl 10 MG TABS Take 1 tablet (10 mg total) by mouth every 4 (four) hours as needed. for pain 180 tablet 0   potassium chloride  SA (KLOR-CON  M) 20 MEQ tablet Take 1 tablet (20 mEq total) by mouth 2 (two) times daily. (Patient taking differently: Take 20 mEq by mouth 2 (two) times daily. Taking potasium citrate 1000 mg per two gummies) 60 tablet 1   prochlorperazine  (COMPAZINE ) 10 MG tablet TAKE ONE TABLET BY MOUTH EVERY 6 HOURS AS NEEDED FOR NAUSEA/VOMITING 90 tablet 3   Current Facility-Administered Medications  Medication Dose Route Frequency Provider Last Rate Last Admin   technetium sestamibi generic (CARDIOLITE ) injection 10.5 millicurie  10.5 millicurie Intravenous Once PRN Revankar, Rajan R, MD       Facility-Administered Medications Ordered in Other Visits  Medication Dose Route Frequency Provider Last Rate Last Admin   heparin  lock flush 100 unit/mL  500 Units Intracatheter Once PRN Baldomero Bone A, NP       sodium chloride  flush (NS) 0.9 % injection 10 mL  10 mL Intracatheter PRN Mosher, Kelli A, PA-C   10 mL at 11/15/23 1111   sodium chloride  flush (NS) 0.9 % injection 10 mL  10 mL Intracatheter PRN Mosher, Kelli A, PA-C       sodium chloride  flush (NS) 0.9 % injection 10 mL  10 mL Intravenous PRN Nolia Baumgartner, MD   10 mL at 12/06/23 1109    sodium chloride  flush 0.9 % injection 10 mL  10 mL Intravenous PRN Nolia Baumgartner, MD   10 mL at 11/05/23 1610    I,Jasmine M Lassiter,acting as a scribe for Nolia Baumgartner, MD.,have documented all relevant documentation on the behalf of Nolia Baumgartner, MD,as directed by  Nolia Baumgartner, MD while in the presence of Nolia Baumgartner, MD.

## 2023-11-27 ENCOUNTER — Encounter: Payer: Self-pay | Admitting: Oncology

## 2023-11-27 ENCOUNTER — Other Ambulatory Visit: Payer: Self-pay | Admitting: Pharmacist

## 2023-11-27 ENCOUNTER — Other Ambulatory Visit: Payer: Self-pay | Admitting: Oncology

## 2023-11-27 ENCOUNTER — Inpatient Hospital Stay

## 2023-11-27 ENCOUNTER — Inpatient Hospital Stay (HOSPITAL_BASED_OUTPATIENT_CLINIC_OR_DEPARTMENT_OTHER): Admitting: Oncology

## 2023-11-27 VITALS — BP 91/60 | HR 95 | Temp 98.2°F | Resp 18 | Ht 62.0 in | Wt 124.8 lb

## 2023-11-27 VITALS — BP 109/57 | HR 88

## 2023-11-27 DIAGNOSIS — C7951 Secondary malignant neoplasm of bone: Secondary | ICD-10-CM

## 2023-11-27 DIAGNOSIS — C50412 Malignant neoplasm of upper-outer quadrant of left female breast: Secondary | ICD-10-CM | POA: Diagnosis not present

## 2023-11-27 DIAGNOSIS — Z5111 Encounter for antineoplastic chemotherapy: Secondary | ICD-10-CM | POA: Diagnosis not present

## 2023-11-27 DIAGNOSIS — Z170 Estrogen receptor positive status [ER+]: Secondary | ICD-10-CM | POA: Diagnosis not present

## 2023-11-27 LAB — CMP (CANCER CENTER ONLY)
ALT: 15 U/L (ref 0–44)
AST: 20 U/L (ref 15–41)
Albumin: 3.8 g/dL (ref 3.5–5.0)
Alkaline Phosphatase: 91 U/L (ref 38–126)
Anion gap: 9 (ref 5–15)
BUN: 24 mg/dL — ABNORMAL HIGH (ref 6–20)
CO2: 29 mmol/L (ref 22–32)
Calcium: 9.7 mg/dL (ref 8.9–10.3)
Chloride: 103 mmol/L (ref 98–111)
Creatinine: 0.49 mg/dL (ref 0.44–1.00)
GFR, Estimated: >60 mL/min (ref >60)
Glucose, Bld: 110 mg/dL — ABNORMAL HIGH (ref 70–99)
Potassium: 4.4 mmol/L (ref 3.5–5.1)
Sodium: 141 mmol/L (ref 135–145)
Total Bilirubin: <0.2 mg/dL (ref 0.0–1.2)
Total Protein: 6.1 g/dL — ABNORMAL LOW (ref 6.5–8.1)

## 2023-11-27 LAB — CBC WITH DIFFERENTIAL (CANCER CENTER ONLY)
Abs Immature Granulocytes: 0.02 10*3/uL (ref 0.00–0.07)
Basophils Absolute: 0 10*3/uL (ref 0.0–0.1)
Basophils Relative: 1 %
Eosinophils Absolute: 0.3 10*3/uL (ref 0.0–0.5)
Eosinophils Relative: 5 %
HCT: 38.1 % (ref 36.0–46.0)
Hemoglobin: 12 g/dL (ref 12.0–15.0)
Immature Granulocytes: 0 %
Lymphocytes Relative: 13 %
Lymphs Abs: 0.9 10*3/uL (ref 0.7–4.0)
MCH: 27.6 pg (ref 26.0–34.0)
MCHC: 31.5 g/dL (ref 30.0–36.0)
MCV: 87.8 fL (ref 80.0–100.0)
Monocytes Absolute: 0.7 10*3/uL (ref 0.1–1.0)
Monocytes Relative: 10 %
Neutro Abs: 4.6 10*3/uL (ref 1.7–7.7)
Neutrophils Relative %: 71 %
Platelet Count: 310 10*3/uL (ref 150–400)
RBC: 4.34 MIL/uL (ref 3.87–5.11)
RDW: 16 % — ABNORMAL HIGH (ref 11.5–15.5)
WBC Count: 6.5 10*3/uL (ref 4.0–10.5)
nRBC: 0 % (ref 0.0–0.2)
nRBC: 0 /100{WBCs}

## 2023-11-27 LAB — MAGNESIUM: Magnesium: 2 mg/dL (ref 1.7–2.4)

## 2023-11-27 MED ORDER — FULVESTRANT 250 MG/5ML IM SOSY
500.0000 mg | PREFILLED_SYRINGE | Freq: Once | INTRAMUSCULAR | Status: AC
  Administered 2023-11-27: 500 mg via INTRAMUSCULAR
  Filled 2023-11-27: qty 10

## 2023-11-27 MED ORDER — SODIUM CHLORIDE 0.9% FLUSH
10.0000 mL | INTRAVENOUS | Status: DC | PRN
  Administered 2023-11-27: 10 mL

## 2023-11-27 MED ORDER — DEXTROSE 5 % IV SOLN: Freq: Once | INTRAVENOUS | Status: AC

## 2023-11-27 MED ORDER — SODIUM CHLORIDE 0.9 % IV SOLN: Freq: Once | INTRAVENOUS | Status: AC

## 2023-11-27 MED ORDER — FAM-TRASTUZUMAB DERUXTECAN-NXKI CHEMO 100 MG IV SOLR
5.3000 mg/kg | Freq: Once | INTRAVENOUS | Status: AC
  Administered 2023-11-27: 300 mg via INTRAVENOUS
  Filled 2023-11-27: qty 15

## 2023-11-27 MED ORDER — DEXAMETHASONE SODIUM PHOSPHATE 10 MG/ML IJ SOLN
10.0000 mg | Freq: Once | INTRAMUSCULAR | Status: AC
  Administered 2023-11-27: 10 mg via INTRAVENOUS
  Filled 2023-11-27: qty 1

## 2023-11-27 MED ORDER — HEPARIN SOD (PORK) LOCK FLUSH 100 UNIT/ML IV SOLN
500.0000 [IU] | Freq: Once | INTRAVENOUS | Status: AC | PRN
  Administered 2023-11-27: 500 [IU]

## 2023-11-27 MED ORDER — PALONOSETRON HCL INJECTION 0.25 MG/5ML
0.2500 mg | Freq: Once | INTRAVENOUS | Status: AC
  Administered 2023-11-27: 0.25 mg via INTRAVENOUS
  Filled 2023-11-27: qty 5

## 2023-11-27 MED ORDER — DIPHENHYDRAMINE HCL 25 MG PO CAPS
50.0000 mg | ORAL_CAPSULE | Freq: Once | ORAL | Status: DC
Start: 2023-11-27 — End: 2023-11-27

## 2023-11-27 MED ORDER — ACETAMINOPHEN 325 MG PO TABS: 650.0000 mg | ORAL_TABLET | Freq: Once | ORAL | Status: DC

## 2023-11-27 MED ORDER — FOSAPREPITANT DIMEGLUMINE INJECTION 150 MG
150.0000 mg | Freq: Once | INTRAVENOUS | Status: AC
  Administered 2023-11-27: 150 mg via INTRAVENOUS
  Filled 2023-11-27: qty 150

## 2023-11-27 NOTE — Patient Instructions (Signed)
 Fam-Trastuzumab Deruxtecan Injection What is this medication? FAM-TRASTUZUMAB DERUXTECAN (fam-tras TOOZ eu mab DER ux TEE kan) treats some types of cancer. It works by blocking a protein that causes cancer cells to grow and multiply. This helps to slow or stop the spread of cancer cells. This medicine may be used for other purposes; ask your health care provider or pharmacist if you have questions. COMMON BRAND NAME(S): ENHERTU What should I tell my care team before I take this medication? They need to know if you have any of these conditions: Heart disease Heart failure Infection, especially a viral infection, such as chickenpox, cold sores, or herpes Liver disease Lung or breathing disease, such as asthma or COPD An unusual or allergic reaction to fam-trastuzumab deruxtecan, other medications, foods, dyes, or preservatives Pregnant or trying to get pregnant Breast-feeding How should I use this medication? This medication is injected into a vein. It is given by your care team in a hospital or clinic setting. A special MedGuide will be given to you before each treatment. Be sure to read this information carefully each time. Talk to your care team about the use of this medication in children. Special care may be needed. Overdosage: If you think you have taken too much of this medicine contact a poison control center or emergency room at once. NOTE: This medicine is only for you. Do not share this medicine with others. What if I miss a dose? It is important not to miss your dose. Call your care team if you are unable to keep an appointment. What may interact with this medication? Interactions are not expected. This list may not describe all possible interactions. Give your health care provider a list of all the medicines, herbs, non-prescription drugs, or dietary supplements you use. Also tell them if you smoke, drink alcohol, or use illegal drugs. Some items may interact with your  medicine. What should I watch for while using this medication? Visit your care team for regular checks on your progress. Tell your care team if your symptoms do not start to get better or if they get worse. This medication may increase your risk of getting an infection. Call your care team for advice if you get a fever, chills, sore throat, or other symptoms of a cold or flu. Do not treat yourself. Try to avoid being around people who are sick. Avoid taking medications that contain aspirin, acetaminophen, ibuprofen, naproxen, or ketoprofen unless instructed by your care team. These medications may hide a fever. Be careful brushing or flossing your teeth or using a toothpick because you may get an infection or bleed more easily. If you have any dental work done, tell your dentist you are receiving this medication. This medication may cause dry eyes and blurred vision. If you wear contact lenses, you may feel some discomfort. Lubricating eye drops may help. See your care team if the problem does not go away or is severe. Talk to your care team if you may be pregnant. Serious birth defects can occur if you take this medication during pregnancy and for 7 months after the last dose. If your partner can get pregnant, use a condom during sex while taking this medication and for 4 months after the last dose. Do not breastfeed while taking this medication and for 7 months after the last dose. This medication may cause infertility. Talk to your care team if you are concerned about your fertility. What side effects may I notice from receiving this medication? Side effects  that you should report to your care team as soon as possible: Allergic reactions--skin rash, itching, hives, swelling of the face, lips, tongue, or throat Dry cough, shortness of breath or trouble breathing Infection--fever, chills, cough, sore throat, wounds that don't heal, pain or trouble when passing urine, general feeling of discomfort or  being unwell Heart failure--shortness of breath, swelling of the ankles, feet, or hands, sudden weight gain, unusual weakness or fatigue Unusual bruising or bleeding Side effects that usually do not require medical attention (report these to your care team if they continue or are bothersome): Constipation Diarrhea Hair loss Muscle pain Nausea Vomiting This list may not describe all possible side effects. Call your doctor for medical advice about side effects. You may report side effects to FDA at 1-800-FDA-1088. Where should I keep my medication? This medication is given in a hospital or clinic. It will not be stored at home. NOTE: This sheet is a summary. It may not cover all possible information. If you have questions about this medicine, talk to your doctor, pharmacist, or health care provider.  2024 [employer] (2023-03-29 00:00:00)

## 2023-11-27 NOTE — Progress Notes (Signed)
 Face to face [facility]. Pt reports that she is feeling better. She has skipped her last tow treatments to get blood and it is making her feel much better, Encouraged pt to attend the [facility].

## 2023-12-05 ENCOUNTER — Encounter: Payer: Self-pay | Admitting: Oncology

## 2023-12-06 ENCOUNTER — Encounter: Payer: Self-pay | Admitting: Oncology

## 2023-12-06 ENCOUNTER — Other Ambulatory Visit: Payer: Self-pay | Admitting: Oncology

## 2023-12-06 ENCOUNTER — Inpatient Hospital Stay (HOSPITAL_BASED_OUTPATIENT_CLINIC_OR_DEPARTMENT_OTHER): Admitting: Oncology

## 2023-12-06 ENCOUNTER — Inpatient Hospital Stay

## 2023-12-06 VITALS — BP 109/83 | HR 97 | Temp 98.3°F | Resp 18 | Ht 62.0 in | Wt 123.9 lb

## 2023-12-06 DIAGNOSIS — Z170 Estrogen receptor positive status [ER+]: Secondary | ICD-10-CM | POA: Diagnosis not present

## 2023-12-06 DIAGNOSIS — C50412 Malignant neoplasm of upper-outer quadrant of left female breast: Secondary | ICD-10-CM | POA: Diagnosis not present

## 2023-12-06 DIAGNOSIS — Z5111 Encounter for antineoplastic chemotherapy: Secondary | ICD-10-CM | POA: Diagnosis not present

## 2023-12-06 DIAGNOSIS — C7951 Secondary malignant neoplasm of bone: Secondary | ICD-10-CM

## 2023-12-06 LAB — CMP (CANCER CENTER ONLY)
ALT: 19 U/L (ref 0–44)
AST: 19 U/L (ref 15–41)
Albumin: 3.8 g/dL (ref 3.5–5.0)
Alkaline Phosphatase: 91 U/L (ref 38–126)
Anion gap: 9 (ref 5–15)
BUN: 26 mg/dL — ABNORMAL HIGH (ref 6–20)
CO2: 29 mmol/L (ref 22–32)
Calcium: 9.2 mg/dL (ref 8.9–10.3)
Chloride: 101 mmol/L (ref 98–111)
Creatinine: 0.55 mg/dL (ref 0.44–1.00)
GFR, Estimated: >60 mL/min
Glucose, Bld: 127 mg/dL — ABNORMAL HIGH (ref 70–99)
Potassium: 3.9 mmol/L (ref 3.5–5.1)
Sodium: 138 mmol/L (ref 135–145)
Total Bilirubin: 0.2 mg/dL (ref 0.0–1.2)
Total Protein: 6.1 g/dL — ABNORMAL LOW (ref 6.5–8.1)

## 2023-12-06 LAB — CBC WITH DIFFERENTIAL (CANCER CENTER ONLY)
Abs Immature Granulocytes: 0.03 10*3/uL (ref 0.00–0.07)
Basophils Absolute: 0 10*3/uL (ref 0.0–0.1)
Basophils Relative: 0 %
Eosinophils Absolute: 0.2 10*3/uL (ref 0.0–0.5)
Eosinophils Relative: 2 %
HCT: 38.2 % (ref 36.0–46.0)
Hemoglobin: 12.1 g/dL (ref 12.0–15.0)
Immature Granulocytes: 1 %
Lymphocytes Relative: 10 %
Lymphs Abs: 0.7 10*3/uL (ref 0.7–4.0)
MCH: 27.1 pg (ref 26.0–34.0)
MCHC: 31.7 g/dL (ref 30.0–36.0)
MCV: 85.7 fL (ref 80.0–100.0)
Monocytes Absolute: 0.6 10*3/uL (ref 0.1–1.0)
Monocytes Relative: 9 %
Neutro Abs: 5.2 10*3/uL (ref 1.7–7.7)
Neutrophils Relative %: 78 %
Platelet Count: 288 10*3/uL (ref 150–400)
RBC: 4.46 MIL/uL (ref 3.87–5.11)
RDW: 16.1 % — ABNORMAL HIGH (ref 11.5–15.5)
WBC Count: 6.6 10*3/uL (ref 4.0–10.5)
nRBC: 0 % (ref 0.0–0.2)
nRBC: 0 /100{WBCs}

## 2023-12-06 LAB — MAGNESIUM: Magnesium: 2 mg/dL (ref 1.7–2.4)

## 2023-12-06 MED ORDER — SODIUM CHLORIDE 0.9% FLUSH
10.0000 mL | INTRAVENOUS | Status: AC | PRN
  Administered 2023-12-06: 10 mL via INTRAVENOUS

## 2023-12-06 MED ORDER — SODIUM CHLORIDE 0.9% FLUSH: 10.0000 mL | INTRAVENOUS | Status: AC | PRN

## 2023-12-06 MED ORDER — HEPARIN SOD (PORK) LOCK FLUSH 100 UNIT/ML IV SOLN
500.0000 [IU] | Freq: Once | INTRAVENOUS | Status: AC | PRN
Start: 2023-12-06 — End: ?

## 2023-12-06 NOTE — Addendum Note (Signed)
 Addended by: Winda Hastings on: 12/06/2023 11:10 AM   Modules accepted: Orders

## 2023-12-06 NOTE — Progress Notes (Signed)
 Anna [name]  [address] [place],  [place]  [address] [phone]  Clinic Day: 12/06/2023  Referring physician: Georgean Kindle, MD  ASSESSMENT & PLAN:  Assessment: Malignant neoplasm metastatic to bone [name]) Bone metastases diagnosed in November 2022, with multiple marrow replacing lesions within the proximal right femur within the ischium as well as the inferior rami. 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 Ki67 was 60%. PET imaging in December revealed dominant finding of intensely hypermetabolic aggressive skeletal metastasis involving the calvarium, sternum, thoracic and lumbar spine, and pelvis, with potential pathologic fracture of the right femur with internal fixation. There was soft tissue extension into the anterior mediastinum associated with the manubrial metastasis. Multiple spinal vertebral body lesions had cortical destruction along the posterior margin. She started monthly Xgeva  in December, and completed radiation therapy. She was receiving THP, but cycle 4 was delayed and dose reduction made by 20% due to left leg skin infection.    She continued with Herceptin /Perjeta  until she had progression of disease. In January 2024, she developed increased pain of the left hip. She was found to have innumerable metastatic lesions throughout the pelvis and proximal femur. There is a non displaced pathologic fracture through a large lesion in the superior left acetabulum. She was referred to an orthopedic oncologist at [facility] and they recommended radiation and no surgery. She received palliative radiation. She followed-up with [facility] in April and they do not recommend surgery, just continued surveillance.    We then switched her to Enhertu  chemotherapy, but she has had significant toxicities of nausea, vomiting, diarrhea, and hypokalemia.  She was also placed on hormonal therapy  with Faslodex  injections.  Repeat CT imaging in July revealed decreased soft tissue thickening around the left subclavian structures and thoracic inlet.  Left lateral breast mass was stable.  Widespread mixed lytic and sclerotic osseous metastatic lesions were stable.  There was unchanged severe elevation of the left hemidiaphragm. CT neck in October did not reveal any soft tissue masses.  The extensive mixed lytic and sclerotic skeletal metastases were much more pronounced in the visible upper thorax than in the cervical spine and were felt to be grossly stable from July 2024. CT chest, abdomen and pelvis in November 2024 revealed stable bony disease without evidence of new/progressive disease. She completed 18 cycles of Enhertu , which was then placed on hold due to severe anemia, with a precipitous drop in March of 2025.  She required multiple units of blood. She was found to be iron  deficient and received IV iron  in the form of Venofer  for 5 doses. She was referred to Dr. Venice Gillis to consider evaluation of the iron  deficiency and is scheduled to see him on June 19th.  Fortunately, her hemoglobin has improved to 12.0 and so we will resume the Enhertu  to see how she tolerates this. We will plan repeat CT scans in May or June.    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  She was treated with lumpectomy followed by adjuvant chemotherapy and radiation therapy. She was on adjuvant hormonal therapy with tamoxifen 20 mg daily from October 2015 to May 2020, but due to elevation of the liver transaminases was switched to anastrazole.  She then developed bone metastasis in November 2022 so has been receiving chemotherapy. She had skin  thickening on her last mammogram, but a nodule in the left breast was stable on CT imaging. The skin thickening is felt to possibly due to radiation, as her exam  remained stable. Continued mammograms are not recommended.   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 She developed pain of the right jaw in February 2025.  Panorex x-rays revealed an abnormal area in the right lower jaw consistent with osteonecrosis.  Xgeva  was discontinued.  She was treated with Augmentin  in February.  She has been working closely with her dentist. She has had 2 fillings done, but also may need root canals.  She sees him again on April 8.  She had swelling and erythema of the left jaw earlier this month, so was placed on amoxicillin  4 times daily, with resolution of the swelling and erythema.  The redness and swelling have resolved, but she states she still has some mild tenderness of the anterior left jaw.  She will contact us  if she has recurrent swelling and erythema.   Iron  deficiency anemia New iron  deficiency anemia diagnosed in earlier this month, when she presented with acute worsening of her anemia. She required transfusion of 3 units of PRBC's. She then received IV iron  in the form of Venofer  for 5 doses. She is taking oral iron  now.  She was referred to Dr. Venice Gillis for consideration of evaluation of the iron  deficiency and will see him June 19th.  Her hemoglobin has improved at 12.1.    Tremor Mild right hand/arm tremor of uncertain etiology, with occasional blurry vision. The tremor is less pronounced. MRI brain in Sep 08, 2023 revealed multiple sclerotic bone lesions, but no intracranial abnormalities.    Plan:  She is scheduled for an echocardiogram on 12/10/2023. Her day 1 cycle 20 of Enhertu  is scheduled on 12/25/2023. She has a WBC of 6.6, hemoglobin of 12.1, and platelet count of 288,000. Her magnesium  is normal at 2.0 and her CMP is normal other than a elevated BUN of 26 and stable but low total protein of 6.1. I encouraged her to increase oral fluids but I don't think she needs IV fluids today.  I will see her in 3 weeks with CBC,  CMP, CA 27.29, and magnesium  prior to her next dose of Enhertu . I will plan repeat CT scans in 2-4 months. The patient understands the plans discussed today and is in agreement with them.  She knows to contact our office if she develops concerns prior to her next appointment.  I provided 10 minutes of face-to-face time during this encounter and > 50% was spent counseling as documented under my assessment and plan.   Anna Baumgartner, MD [facility] DEPT OF [facility] [address] [place] [address] Dept: [phone] Dept Fax: [phone]   No orders of the defined types were placed in this encounter.   CHIEF COMPLAINT:  CC: Recurrent HER2 receptor positive breast cancer with bone metastasis  Current Treatment: On hold  HISTORY OF PRESENT ILLNESS:   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 (HCC)   07/23/2013 Cancer Staging   Staging form: Breast, AJCC 7th Edition - Clinical stage from 07/23/2013: Stage IIB (T2, N1, M0) - Signed by Anna Baumgartner, MD on 07/04/2020 Prognostic indicators: Pos LVI   09/22/2021 -  04/06/2022 Chemotherapy   Patient is on Treatment Plan : BREAST  Trastuzumab  + Pertuzumab  q21d      09/22/2021 - 09/21/2022 Chemotherapy   Patient is on Treatment Plan : BREAST Trastuzumab  + Pertuzumab  q21d     10/12/2022 -  Chemotherapy   Patient is on Treatment Plan : BREAST METASTATIC Fam-Trastuzumab Deruxtecan-nxki  (Enhertu ) (5.4) q21d     Malignant neoplasm metastatic to bone (HCC)  06/28/2021 Initial Diagnosis   Bone metastases (HCC)   09/22/2021 - 04/06/2022 Chemotherapy   Patient is on Treatment Plan : BREAST  Trastuzumab  + Pertuzumab  q21d      09/22/2021 - 09/21/2022 Chemotherapy   Patient is on Treatment Plan : BREAST Trastuzumab  + Pertuzumab  q21d      05/11/2022 Imaging   CT chest, abdomen and pelvis:  IMPRESSION:  1. Widespread bony metastatic disease shows increased sclerosis  since previous imaging. This is likely related to response to  therapy in the interval. There is a single lesion along the LEFT  iliac which shows continued lytic change and warrants attention on  follow-up  2. Interval development of pathologic fractures at in the upper  thoracic spine and at the thoracolumbar junction with associated  kyphosis.  3. No signs of solid organ or nodal metastatic disease.  4. Marked elevation of the LEFT hemidiaphragm as on the prior PET.  5. Aortic atherosclerosis.    10/12/2022 -  Chemotherapy   Patient is on Treatment Plan : BREAST METASTATIC Fam-Trastuzumab Deruxtecan-nxki  (Enhertu ) (5.4) q21d       INTERVAL HISTORY:  Seanne is here today for repeat clinical assessment for her recurrent HER2 receptor positive breast cancer with bone metastasis. Her Enhertu  has been on hold for the last month due to severe anemia requiring transfusions and IV iron . We resumed Enhertu  chemotherapy last week and she had only mild nausea. Patient states that she feels ok but complains of lower back pain rating 5/10 and fatigue as she didn't sleep well last night. She is scheduled for an echocardiogram on 12/10/2023. Her day 1 cycle 20 of Enhertu  is scheduled on 12/25/2023. She has a WBC of 6.6, hemoglobin of 12.1, and platelet count of 288,000. Her magnesium  is normal at 2.0 and her CMP is normal other than a elevated BUN of 26 and stable but low total protein of 6.1. I encouraged her to increase oral fluids but I don't think she needs IV fluids today.  I will see her in 3 weeks with CBC, CMP, CA 27.29, and magnesium  prior to her next dose of Enhertu . I will plan repeat CT scans in 2-4 months. She denies fever, chills, night sweats, or other signs of infection. She denies cardiorespiratory and gastrointestinal issues. Her appetite is ok and her weight  has decreased 1 pounds over last week. This patient is accompanied in the office by her husband.   REVIEW OF SYSTEMS:  Review of Systems  Constitutional:  Positive for fatigue. Negative for appetite change, chills, diaphoresis, fever and unexpected weight change.  HENT:  Negative.  Negative for hearing loss, lump/mass, mouth sores, nosebleeds, sore throat, tinnitus, trouble swallowing and voice change.   Eyes: Negative.  Negative for eye problems and icterus.  Respiratory: Negative.  Negative for chest tightness, cough, hemoptysis, shortness of breath and wheezing.   Cardiovascular: Negative.  Negative for chest pain, leg swelling and palpitations.  Gastrointestinal:  Positive for nausea (mild, after treatment). Negative for abdominal distention, abdominal pain, blood in stool, constipation, diarrhea, rectal pain and vomiting.  Endocrine: Negative.  Negative for hot flashes.  Genitourinary: Negative.  Negative for bladder incontinence, difficulty urinating, dyspareunia, dysuria, frequency, hematuria, menstrual problem, nocturia, pelvic pain, vaginal bleeding and vaginal discharge.   Musculoskeletal:  Positive for back pain (lower, 5/10). Negative for arthralgias, flank pain, gait problem, myalgias, neck pain and neck stiffness.  Skin: Negative.  Negative for itching, rash and wound.  Neurological:  Negative for dizziness, extremity weakness, gait problem, headaches, light-headedness, numbness, seizures and speech difficulty.  Hematological: Negative.  Negative for adenopathy. Does not bruise/bleed easily.  Psychiatric/Behavioral: Negative.  Negative for confusion, decreased concentration, depression, sleep disturbance and suicidal ideas. The patient is not nervous/anxious.     VITALS:  Blood pressure 109/83, pulse 97, temperature 98.3 F (36.8 C), temperature source Oral, resp. rate 18, height 5\' 2"  (1.575 m), weight 123 lb 14.4 oz (56.2 kg), last menstrual period 08/18/2013, SpO2 97%.  Wt  Readings from Last 3 Encounters:  12/06/23 123 lb 14.4 oz (56.2 kg)  11/27/23 124 lb 12.8 oz (56.6 kg)  11/15/23 124 lb 6.4 oz (56.4 kg)    Body mass index is 22.66 kg/m.  Performance status (ECOG): 1 - Symptomatic but completely ambulatory  PHYSICAL EXAM:  Physical Exam Vitals and nursing note reviewed. Exam conducted with a chaperone present.  Constitutional:      General: She is not in acute distress.    Appearance: Normal appearance. She is normal weight. She is not ill-appearing, toxic-appearing or diaphoretic.  HENT:     Head: Normocephalic and atraumatic.     Right Ear: Tympanic membrane, ear canal and external ear normal. There is no impacted cerumen.     Left Ear: Tympanic membrane, ear canal and external ear normal.     Nose: Nose normal. No congestion or rhinorrhea.     Mouth/Throat:     Mouth: Mucous membranes are moist.     Pharynx: Oropharynx is clear. No oropharyngeal exudate or posterior oropharyngeal erythema.  Eyes:     General: No scleral icterus.       Right eye: No discharge.        Left eye: No discharge.     Extraocular Movements: Extraocular movements intact.     Conjunctiva/sclera: Conjunctivae normal.     Pupils: Pupils are equal, round, and reactive to light.  Neck:     Vascular: No carotid bruit.     Comments: Her neck is in chronic flexure with some degree of kyphosis Cardiovascular:     Rate and Rhythm: Normal rate and regular rhythm.     Pulses: Normal pulses.     Heart sounds: Normal heart sounds. No murmur heard.    No friction rub. No gallop.  Pulmonary:     Effort: Pulmonary effort is normal. No respiratory distress.     Breath sounds: No stridor. No decreased breath sounds, wheezing, rhonchi or rales.  Chest:     Chest wall: No tenderness.     Comments: Breast exam is deferred. Abdominal:     General: Bowel sounds are normal. There is no distension.     Palpations: Abdomen is soft. There is no hepatomegaly, splenomegaly or mass.      Tenderness: There is no abdominal tenderness. There is no right CVA tenderness, left CVA tenderness, guarding or rebound.     Hernia: No hernia is present.  Musculoskeletal:        General: No swelling, tenderness, deformity or signs of injury. Normal range of motion.     Cervical back: Normal range of motion and  neck supple. No rigidity or tenderness.     Right lower leg: No edema.     Left lower leg: No edema.  Lymphadenopathy:     Cervical: No cervical adenopathy.     Upper Body:     Right upper body: No supraclavicular or axillary adenopathy.     Left upper body: No supraclavicular or axillary adenopathy.     Lower Body: No right inguinal adenopathy. No left inguinal adenopathy.  Skin:    General: Skin is warm and dry.     Coloration: Skin is not jaundiced or pale.     Findings: No bruising, erythema, lesion or rash.  Neurological:     General: No focal deficit present.     Mental Status: She is alert and oriented to person, place, and time. Mental status is at baseline.     Cranial Nerves: No cranial nerve deficit.     Sensory: No sensory deficit.     Motor: No weakness.     Coordination: Coordination normal.     Gait: Gait normal.     Deep Tendon Reflexes: Reflexes normal.  Psychiatric:        Mood and Affect: Mood normal.        Behavior: Behavior normal.        Thought Content: Thought content normal.        Judgment: Judgment normal.     LABS:      Latest Ref Rng & Units 12/06/2023    8:45 AM 11/27/2023    8:14 AM 11/15/2023    9:31 AM  CBC  WBC 4.0 - 10.5 K/uL 6.6  6.5  6.3   Hemoglobin 12.0 - 15.0 g/dL 16.1  09.6  04.5   Hematocrit 36.0 - 46.0 % 38.2  38.1  35.1   Platelets 150 - 400 K/uL 288  310  301       Latest Ref Rng & Units 12/06/2023    8:45 AM 11/27/2023    8:14 AM 11/15/2023    9:31 AM  CMP  Glucose 70 - 99 mg/dL 409  811  914   BUN 6 - 20 mg/dL 26  24  21    Creatinine 0.44 - 1.00 mg/dL 7.82  9.56  2.13   Sodium 135 - 145 mmol/L 138  141  140    Potassium 3.5 - 5.1 mmol/L 3.9  4.4  4.2   Chloride 98 - 111 mmol/L 101  103  103   CO2 22 - 32 mmol/L 29  29  28    Calcium  8.9 - 10.3 mg/dL 9.2  9.7  9.1   Total Protein 6.5 - 8.1 g/dL 6.1  6.1  5.7   Total Bilirubin 0.0 - 1.2 mg/dL 0.2  <0.8  <6.5   Alkaline Phos 38 - 126 U/L 91  91  92   AST 15 - 41 U/L 19  20  23    ALT 0 - 44 U/L 19  15  18     Lab Results  Component Value Date   CEA1 2.2 07/20/2021   /  CEA  Date Value Ref Range Status  07/20/2021 2.2 0.0 - 4.7 ng/mL Final    Comment:    (NOTE)                             Nonsmokers          <3.9  Smokers             <5.6 [employer] Electrochemiluminescence Immunoassay (ECLIA) Values obtained with different assay methods or kits cannot be used interchangeably.  Results cannot be interpreted as absolute evidence of the presence or absence of malignant disease. Performed At: [facility] [address] [place], [place] [id number] Pearlean Botts MD HY:[id number]    Lab Results  Component Value Date   TIBC 251 10/25/2023   TIBC 225 (L) 07/10/2023   TIBC 256 01/14/2023   FERRITIN 129 10/25/2023   FERRITIN 305 07/10/2023   FERRITIN 333 (H) 01/14/2023   IRONPCTSAT 8 (L) 10/25/2023   IRONPCTSAT 11 07/10/2023   IRONPCTSAT 16 01/14/2023   Lab Results  Component Value Date   FOLATE 5.6 (L) 10/25/2023   Lab Results  Component Value Date   VITAMINB12 317 10/25/2023   Lab Results  Component Value Date   LDH 143 10/25/2023   LDH 171 07/10/2023   STUDIES:  EXAM: 09/05/2023 CT CHEST, ABDOMEN, AND PELVIS WITH CONTRAST IMPRESSION: 1. Unchanged size of the mass in the left breast with overlying skin thickening. 2. No significant interval change in the widespread osseous metastatic disease involving the axial and proximal appendicular skeleton. 3. Similar soft tissue thickening about the left subclavian structures and thoracic inlet, possibly reflecting posttreatment change.  Suggest continued attention on follow-up imaging. 4. No evidence of new or progressive disease within the chest, abdomen or pelvis. 5.  Aortic Atherosclerosis (ICD10-I70.0).   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   Bipolar disorder (HCC) 11/29/2020   Breast cancer (HCC)    Cancer (HCC)    Cardiac murmur    Chest pain    Chronic fatigue syndrome 11/29/2020   Depression    Essential hypertension 11/29/2020   Hepatic steatosis determined by biopsy of liver 07/04/2020   HLD (hyperlipidemia)    Hypertension    Hypomagnesemia 03/26/2023   Hypothyroidism 11/29/2020   Insomnia 11/29/2020   Iron  deficiency anemia 06/05/2022   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   Migraine 11/29/2020   Mild intermittent asthma 11/29/2020   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   Mixed hyperlipidemia 11/29/2020   Obesity due to excess calories    Osteopenia after menopause 07/04/2020   Other long term (current) drug therapy 11/29/2020   Other vitamin B12 deficiency anemias 11/29/2020   Personal history of malignant neoplasm of breast 11/29/2020   Pre-diabetes    Renal cyst, acquired, right 07/04/2020   14.5 cm in October 2021   Retrocalcaneal bursitis (back of heel), left 04/22/2018   Seasonal allergic rhinitis 11/29/2020   Tightness of heel cord, left 04/22/2018   Tremor 08/02/2023   Type 2 diabetes mellitus without complications (HCC) 11/29/2020   Vitamin D  deficiency 11/29/2020    Past Surgical History:  Procedure Laterality Date   CESAREAN SECTION     IR BONE TUMOR(S)RF ABLATION  05/29/2022   IR BONE TUMOR(S)RF ABLATION  05/29/2022   IR KYPHO EA ADDL LEVEL THORACIC OR LUMBAR  05/29/2022   IR KYPHO THORACIC WITH BONE  BIOPSY  05/29/2022   IR RADIOLOGIST EVAL & MGMT  05/22/2022   IR RADIOLOGIST EVAL & MGMT  06/12/2022   LIVER BIOPSY  01/2019   lumpectomy  2014   Left  Breast   PORT-A-CATH REMOVAL     PORTACATH PLACEMENT  08/28/2013   STOMACH SURGERY     Tummy Tuck   TOTAL HIP ARTHROPLASTY Right 07/04/2021   Procedure: TOTAL HIP ARTHROPLASTY;  Surgeon: Claiborne Crew, MD;  Location: [facility];  Service: Orthopedics;  Laterality: Right;  90   WISDOM TOOTH EXTRACTION      Family History  Problem Relation Age of Onset   Hyperlipidemia Mother    Diabetes Father    Stroke Father    Colon cancer Neg Hx    Colon polyps Neg Hx    Esophageal cancer Neg Hx    Rectal cancer Neg Hx    Stomach cancer Neg Hx     Social History:  reports that she quit smoking about 21 years ago. Her smoking use included cigarettes. She has never used smokeless tobacco. She reports current alcohol use. She reports that she does not use drugs.The patient is accompanied by her husband today.  Allergies:  Allergies  Allergen Reactions   Reglan  [Metoclopramide ]     Made patient feel really funny when taking this medication    Xgeva  [Denosumab ] Other (See Comments)    Possible ONJ (09/13/23)    Current Medications: Current Outpatient Medications  Medication Sig Dispense Refill   albuterol  (VENTOLIN  HFA) 108 (90 Base) MCG/ACT inhaler Inhale 2 puffs into the lungs every 6 (six) hours as needed for shortness of breath.     amoxicillin  (AMOXIL ) 500 MG capsule TAKE ONE CAPSULE BY MOUTH FOUR TIMES A DAY 40 capsule 0   ARIPiprazole  (ABILIFY ) 5 MG tablet Take 2.5 mg by mouth daily.     aspirin 81 MG EC tablet Take 81 mg by mouth daily. Swallow whole.     atorvastatin  (LIPITOR) 20 MG tablet TAKE ONE TABLET BY MOUTH EVERY DAY 90 tablet 1   augmented betamethasone dipropionate (DIPROLENE-AF) 0.05 % cream      dapagliflozin  propanediol (FARXIGA ) 10 MG TABS tablet TAKE ONE TABLET BY MOUTH EVERY DAY 30 tablet 10   desvenlafaxine  (PRISTIQ )  50 MG 24 hr tablet Take 50 mg by mouth daily.     dexamethasone  (DECADRON ) 4 MG tablet TAKE ONE TABLET BY MOUTH 2 TIMES A DAY FOR 5 DAYS AFTER CHEMOTHERAPY 20 tablet 2   diphenoxylate -atropine  (LOMOTIL ) 2.5-0.025 MG tablet TAKE TWO TABLETS BY MOUTH FOUR TIMES A DAY AS NEEDED FOR DIARRHEA OR LOOSE STOOLS 100 tablet 1   folic acid  (FOLVITE ) 1 MG tablet Take 1 tablet (1 mg total) by mouth daily. 30 tablet 5   labetalol  (NORMODYNE ) 100 MG tablet Take 1 tablet (100 mg total) by mouth 2 (two) times daily. 180 tablet 3   loratadine  (CLARITIN ) 10 MG tablet TAKE ONE TABLET BY MOUTH EVERY DAY 30 tablet 2   LORazepam  (ATIVAN ) 1 MG tablet TAKE ONE TABLET BY MOUTH EVERY 8 HOURS AS NEEDED FOR ANXIETY   NAUSEA  OR VOMITING 30 tablet 0   magic mouthwash (lidocaine , diphenhydrAMINE , alum & mag hydroxide) suspension Take 5 mLs by mouth every 3 (three) hours as needed. Swish and spit; for throat/mouth pain     naloxone  (NARCAN ) nasal spray 4 mg/0.1 mL One spray in nostril as needed, may repeat every 2 to 3 minutes until medical assistance becomes available 4 each 1   nitroGLYCERIN  (NITROSTAT ) 0.4 MG SL tablet Place 0.4 mg under the tongue every 5 (five) minutes as needed for chest pain.     nystatin  (MYCOSTATIN /NYSTOP ) powder Apply 1 Application topically 2 (two) times daily.  omeprazole  (PRILOSEC) 40 MG capsule Take 1 capsule (40 mg total) by mouth at bedtime. 30 capsule 5   ondansetron  (ZOFRAN ) 8 MG tablet Take 1 tablet (8 mg total) by mouth every 8 (eight) hours as needed for nausea or vomiting. 60 tablet 5   Oxycodone  HCl 10 MG TABS Take 1 tablet (10 mg total) by mouth every 4 (four) hours as needed. for pain 180 tablet 0   potassium chloride  SA (KLOR-CON  M) 20 MEQ tablet Take 1 tablet (20 mEq total) by mouth 2 (two) times daily. (Patient taking differently: Take 20 mEq by mouth 2 (two) times daily. Taking potasium citrate 1000 mg per two gummies) 60 tablet 1   prochlorperazine  (COMPAZINE ) 10 MG tablet TAKE ONE  TABLET BY MOUTH EVERY 6 HOURS AS NEEDED FOR NAUSEA/VOMITING 90 tablet 3   Current Facility-Administered Medications  Medication Dose Route Frequency Provider Last Rate Last Admin   technetium sestamibi generic (CARDIOLITE ) injection 10.5 millicurie  10.5 millicurie Intravenous Once PRN Revankar, Rajan R, MD       Facility-Administered Medications Ordered in Other Visits  Medication Dose Route Frequency Provider Last Rate Last Admin   heparin  lock flush 100 unit/mL  500 Units Intracatheter Once PRN Baldomero Bone A, NP       sodium chloride  flush (NS) 0.9 % injection 10 mL  10 mL Intracatheter PRN Mosher, Kelli A, PA-C   10 mL at 11/15/23 1111   sodium chloride  flush (NS) 0.9 % injection 10 mL  10 mL Intracatheter PRN Mosher, Kelli A, PA-C       sodium chloride  flush (NS) 0.9 % injection 10 mL  10 mL Intravenous PRN Anna Baumgartner, MD   10 mL at 12/06/23 1109   sodium chloride  flush 0.9 % injection 10 mL  10 mL Intravenous PRN Anna Baumgartner, MD   10 mL at 11/05/23 2956    I,Jasmine M Lassiter,acting as a scribe for Anna Baumgartner, MD.,have documented all relevant documentation on the behalf of Anna Baumgartner, MD,as directed by  Anna Baumgartner, MD while in the presence of Anna Baumgartner, MD.

## 2023-12-09 ENCOUNTER — Encounter: Payer: Self-pay | Admitting: Oncology

## 2023-12-10 ENCOUNTER — Ambulatory Visit: Attending: Oncology

## 2023-12-10 DIAGNOSIS — Z0189 Encounter for other specified special examinations: Secondary | ICD-10-CM | POA: Diagnosis not present

## 2023-12-10 DIAGNOSIS — C7951 Secondary malignant neoplasm of bone: Secondary | ICD-10-CM

## 2023-12-12 LAB — ECHOCARDIOGRAM COMPLETE
Area-P 1/2: 5.84 cm2
S' Lateral: 2 cm

## 2023-12-13 ENCOUNTER — Other Ambulatory Visit: Payer: Self-pay | Admitting: Oncology

## 2023-12-13 DIAGNOSIS — M87180 Osteonecrosis due to drugs, jaw: Secondary | ICD-10-CM

## 2023-12-19 ENCOUNTER — Encounter: Payer: Self-pay | Admitting: Oncology

## 2023-12-24 NOTE — Progress Notes (Signed)
 [facility]  [address] [place],  [place]  [address] [phone]  Clinic Day: 12/25/23  Referring physician: Georgean Kindle, MD  ASSESSMENT & PLAN:  Assessment: Malignant neoplasm metastatic to bone [facility]) Bone metastases diagnosed in November 2022, with multiple marrow replacing lesions within the proximal right femur within the ischium as well as the inferior rami. 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 Ki67 was 60%. PET imaging in December revealed dominant finding of intensely hypermetabolic aggressive skeletal metastasis involving the calvarium, sternum, thoracic and lumbar spine, and pelvis, with potential pathologic fracture of the right femur with internal fixation. There was soft tissue extension into the anterior mediastinum associated with the manubrial metastasis. Multiple spinal vertebral body lesions had cortical destruction along the posterior margin. She started monthly Xgeva  in December, and completed radiation therapy. She was receiving THP, but cycle 4 was delayed and dose reduction made by 20% due to left leg skin infection. She continued with Herceptin /Perjeta  until she had progression of disease. In January 2024, she developed increased pain of the left hip. She was found to have innumerable metastatic lesions throughout the pelvis and proximal femur. There is a non displaced pathologic fracture through a large lesion in the superior left acetabulum. She was referred to an orthopedic oncologist at [facility] and they recommended radiation and no surgery. She received palliative radiation. She followed-up with [facility] in April and they do not recommend surgery, just continued surveillance.    We then switched her to Enhertu  chemotherapy, but she has had significant toxicities of nausea, vomiting, diarrhea, and hypokalemia.  She was also placed on hormonal therapy with  Faslodex  injections.  Repeat CT imaging in July revealed decreased soft tissue thickening around the left subclavian structures and thoracic inlet.  Left lateral breast mass was stable.  Widespread mixed lytic and sclerotic osseous metastatic lesions were stable.  There was unchanged severe elevation of the left hemidiaphragm. CT neck in October did not reveal any soft tissue masses.  The extensive mixed lytic and sclerotic skeletal metastases were much more pronounced in the visible upper thorax than in the cervical spine and were felt to be grossly stable from July 2024. CT chest, abdomen and pelvis in November 2024 revealed stable bony disease without evidence of new/progressive disease. She completed 18 cycles of Enhertu , which was then placed on hold due to severe anemia, with a precipitous drop in March of 2025.  She required multiple units of blood. She was found to be iron  deficient and received IV iron  in the form of Venofer  for 5 doses. She was referred to Dr. Venice Gillis to consider evaluation of the iron  deficiency and is scheduled to see him on June 19th.  Fortunately, her hemoglobin improved to 12.0 and we resumed the Enhertu  last month and she has tolerated it well. The hemoglobin did drop to 10.7. She and her husband really wish to change her treatments to every 4 weeks and so we will do so. We will plan repeat CT scans in early July.     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  She was treated with lumpectomy followed by adjuvant chemotherapy and radiation therapy. She was on adjuvant hormonal therapy with tamoxifen 20 mg daily from October 2015 to May 2020, but due to elevation of the liver  transaminases was switched to anastrazole.  She then developed bone metastasis in November 2022 so has been receiving chemotherapy. She had skin thickening on her last mammogram, but a  nodule in the left breast was stable on CT imaging. The skin thickening is felt to possibly due to radiation, as her exam remained stable. Continued mammograms are not recommended.   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 She developed pain of the right jaw in February 2025.  Panorex x-rays revealed an abnormal area in the right lower jaw consistent with osteonecrosis.  Xgeva  was discontinued.  She was treated with Augmentin  in February.  She has been working closely with her dentist. She has had 2 fillings done, but also may need root canals.  She sees him again on April 8.  She had swelling and erythema of the left jaw earlier this month, so was placed on amoxicillin  4 times daily, with resolution of the swelling and erythema.  The redness and swelling have resolved, but she states she still has some mild tenderness of the anterior left jaw.  She will contact us  if she has recurrent swelling and erythema.   Iron  deficiency anemia New iron  deficiency anemia diagnosed last month, when she presented with acute worsening of her anemia. She required transfusion of 3 units of PRBC's. She then received IV iron  in the form of Venofer  for 5 doses. She is taking oral iron  now.  She was referred to Dr. Venice Gillis for evaluation of the iron  deficiency and will see him June 19th.  Her hemoglobin improved at 12.1 but now dropped to 10.7 so I will recheck her iron  studies next time.    Tremor Mild right hand/arm tremor of uncertain etiology, with occasional blurry vision. The tremor is less pronounced. MRI brain in August 28, 2023 revealed multiple sclerotic bone lesions, but no intracranial abnormalities.    Plan:  The patient informed me that she was placed back on doxycycline  due to an leg infection, since starting this, it has improved and is currently wrapped. She has an appointment with Dr. Venice Gillis in June. Her day 1 cycle 20 of Enhertu  is scheduled on 12/25/2023 and she states she  tolerated the last treatment fairly well. She has a WBC of 7.4, low hemoglobin of 10.7 down from 12.1, and platelet count of 270,000. Her CMP is fairly normal other than a low total protein of 5.8 and albumin of 3.3. Her potassium is low normal as she currently takes 1 oral supplement daily. I instructed her to increase this to BID. Her magnesium  is normal at 1.9 but her CA 27.29 is still pending. She will have her Faslodex  injection today and I will order IV potassium as she has difficulty tolerating the oral supplement. I will see her back in 4 weeks with CBC, CMP, magnesium , ferritin, and iron /TIBC. We will plan repeat scan in early July. The patient understands the plans discussed today and is in agreement with them.  She knows to contact our office if she develops concerns prior to her next visit.    I provided 17 minutes of face-to-face time during this encounter and > 50% was spent counseling as documented under my assessment and plan.   Nolia Baumgartner, MD [facility] OF [facility] [address] [place] [address] Dept: [phone] Dept Fax: [phone]   No orders of the defined types were placed in this  encounter.   CHIEF COMPLAINT:  CC: Recurrent HER2 receptor positive breast cancer with bone metastasis  Current Treatment: Enhertu   HISTORY OF PRESENT ILLNESS:   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 (HCC)   07/23/2013 Cancer Staging   Staging form: Breast, AJCC 7th Edition - Clinical stage from 07/23/2013: Stage IIB (T2, N1, M0) - Signed by Nolia Baumgartner, MD on 07/04/2020 Prognostic indicators: Pos LVI   09/22/2021 - 04/06/2022 Chemotherapy   Patient is on Treatment Plan : BREAST  Trastuzumab  + Pertuzumab  q21d      09/22/2021 - 09/21/2022 Chemotherapy    Patient is on Treatment Plan : BREAST Trastuzumab  + Pertuzumab  q21d     10/12/2022 -  Chemotherapy   Patient is on Treatment Plan : BREAST METASTATIC Fam-Trastuzumab Deruxtecan-nxki  (Enhertu ) (5.4) q28d (changed from q21d on 12/25/23)     Malignant neoplasm metastatic to bone (HCC)  06/28/2021 Initial Diagnosis   Bone metastases (HCC)   09/22/2021 - 04/06/2022 Chemotherapy   Patient is on Treatment Plan : BREAST  Trastuzumab  + Pertuzumab  q21d      09/22/2021 - 09/21/2022 Chemotherapy   Patient is on Treatment Plan : BREAST Trastuzumab  + Pertuzumab  q21d     05/11/2022 Imaging   CT chest, abdomen and pelvis:  IMPRESSION:  1. Widespread bony metastatic disease shows increased sclerosis  since previous imaging. This is likely related to response to  therapy in the interval. There is a single lesion along the LEFT  iliac which shows continued lytic change and warrants attention on  follow-up  2. Interval development of pathologic fractures at in the upper  thoracic spine and at the thoracolumbar junction with associated  kyphosis.  3. No signs of solid organ or nodal metastatic disease.  4. Marked elevation of the LEFT hemidiaphragm as on the prior PET.  5. Aortic atherosclerosis.    10/12/2022 -  Chemotherapy   Patient is on Treatment Plan : BREAST METASTATIC Fam-Trastuzumab Deruxtecan-nxki  (Enhertu ) (5.4) q28d (changed from q21d on 12/25/23)       INTERVAL HISTORY:  Alyza is here today for repeat clinical assessment for her recurrent HER2 receptor positive breast cancer with bone metastasis. Her Enhertu  has been on hold for the last month due to severe anemia requiring transfusions and IV iron . We resumed Enhertu  chemotherapy last time and she had only mild nausea. Patient states that she feels ok but complains of lower back pain rating 4/10. She informed me that she was placed back on doxycycline  due to an leg infection, since starting this it has improved and is currently wrapped. She has  an appointment with Dr. Venice Gillis in June. Her day 1 cycle 20 of Enhertu  is scheduled on 12/25/2023 and she states she tolerated the last treatment fairly well. She has a WBC of 7.4, low hemoglobin of 10.7 down from 12.1, and platelet count of 270,000. Her CMP is fairly normal other than a low total protein of 5.8 and albumin of 3.3. Her potassium is low normal as she currently takes 1 oral supplement daily. I instructed her to increase this to BID. Her magnesium  is normal at 1.9 but her CA 27.29 is still pending. She will have her Faslodex  injection today and I will order IV potassium as she has difficulty tolerating the oral supplement. I will see her back in 4 weeks with CBC, CMP, magnesium , ferritin, and iron /TIBC. We will plan  repeat scans in early July.   She denies fever, chills, night sweats, or other signs of infection. She denies cardiorespiratory and gastrointestinal issues. Her appetite is good and her weight has increased 4 pounds over last 3.5 weeks. This patient is accompanied in the office by her husband.   REVIEW OF SYSTEMS:  Review of Systems  Constitutional:  Positive for fatigue. Negative for appetite change, chills, diaphoresis, fever and unexpected weight change.  HENT:  Negative.  Negative for hearing loss, lump/mass, mouth sores, nosebleeds, sore throat, tinnitus, trouble swallowing and voice change.   Eyes: Negative.  Negative for eye problems and icterus.  Respiratory: Negative.  Negative for chest tightness, cough, hemoptysis, shortness of breath and wheezing.   Cardiovascular: Negative.  Negative for chest pain, leg swelling and palpitations.  Gastrointestinal:  Positive for nausea (mild, after treatment). Negative for abdominal distention, abdominal pain, blood in stool, constipation, diarrhea, rectal pain and vomiting.  Endocrine: Negative.  Negative for hot flashes.  Genitourinary: Negative.  Negative for bladder incontinence, difficulty urinating, dyspareunia, dysuria,  frequency, hematuria, menstrual problem, nocturia, pelvic pain, vaginal bleeding and vaginal discharge.   Musculoskeletal:  Positive for back pain (lower, 4/10). Negative for arthralgias, flank pain, gait problem, myalgias, neck pain and neck stiffness.  Skin: Negative.  Negative for itching, rash and wound.  Neurological:  Negative for dizziness, extremity weakness, gait problem, headaches, light-headedness, numbness, seizures and speech difficulty.  Hematological: Negative.  Negative for adenopathy. Does not bruise/bleed easily.  Psychiatric/Behavioral: Negative.  Negative for confusion, decreased concentration, depression, sleep disturbance and suicidal ideas. The patient is not nervous/anxious.     VITALS:  Blood pressure 91/67, pulse 94, temperature 98.3 F (36.8 C), temperature source Oral, resp. rate 18, height 5\' 2"  (1.575 m), weight 127 lb 9.6 oz (57.9 kg), last menstrual period 08/18/2013, SpO2 94%.  Wt Readings from Last 3 Encounters:  12/25/23 127 lb 9.6 oz (57.9 kg)  12/06/23 123 lb 14.4 oz (56.2 kg)  11/27/23 124 lb 12.8 oz (56.6 kg)    Body mass index is 23.34 kg/m.  Performance status (ECOG): 1 - Symptomatic but completely ambulatory  PHYSICAL EXAM:  Physical Exam Vitals and nursing note reviewed. Exam conducted with a chaperone present.  Constitutional:      General: She is not in acute distress.    Appearance: Normal appearance. She is normal weight. She is not ill-appearing, toxic-appearing or diaphoretic.  HENT:     Head: Normocephalic and atraumatic.     Right Ear: Tympanic membrane, ear canal and external ear normal. There is no impacted cerumen.     Left Ear: Tympanic membrane, ear canal and external ear normal.     Nose: Nose normal. No congestion or rhinorrhea.     Mouth/Throat:     Mouth: Mucous membranes are moist.     Pharynx: Oropharynx is clear. No oropharyngeal exudate or posterior oropharyngeal erythema.  Eyes:     General: No scleral icterus.        Right eye: No discharge.        Left eye: No discharge.     Extraocular Movements: Extraocular movements intact.     Conjunctiva/sclera: Conjunctivae normal.     Pupils: Pupils are equal, round, and reactive to light.  Neck:     Vascular: No carotid bruit.     Comments: Her neck is in chronic flexure with some degree of kyphosis Cardiovascular:     Rate and Rhythm: Normal rate and regular rhythm.     Pulses: Normal  pulses.     Heart sounds: Normal heart sounds. No murmur heard.    No friction rub. No gallop.  Pulmonary:     Effort: Pulmonary effort is normal. No respiratory distress.     Breath sounds: No stridor. No decreased breath sounds, wheezing, rhonchi or rales.  Chest:     Chest wall: No tenderness.     Comments: Breast exam is deferred. Abdominal:     General: Bowel sounds are normal. There is no distension.     Palpations: Abdomen is soft. There is no hepatomegaly, splenomegaly or mass.     Tenderness: There is no abdominal tenderness. There is no right CVA tenderness, left CVA tenderness, guarding or rebound.     Hernia: No hernia is present.  Musculoskeletal:        General: No swelling, tenderness, deformity or signs of injury. Normal range of motion.     Cervical back: Normal range of motion and neck supple. No rigidity or tenderness.     Right lower leg: No edema.     Left lower leg: No edema.  Lymphadenopathy:     Cervical: No cervical adenopathy.     Upper Body:     Right upper body: No supraclavicular or axillary adenopathy.     Left upper body: No supraclavicular or axillary adenopathy.     Lower Body: No right inguinal adenopathy. No left inguinal adenopathy.  Skin:    General: Skin is warm and dry.     Coloration: Skin is not jaundiced or pale.     Findings: No bruising, erythema, lesion or rash.  Neurological:     General: No focal deficit present.     Mental Status: She is alert and oriented to person, place, and time. Mental status is at baseline.      Cranial Nerves: No cranial nerve deficit.     Sensory: No sensory deficit.     Motor: No weakness.     Coordination: Coordination normal.     Gait: Gait normal.     Deep Tendon Reflexes: Reflexes normal.  Psychiatric:        Mood and Affect: Mood normal.        Behavior: Behavior normal.        Thought Content: Thought content normal.        Judgment: Judgment normal.     LABS:      Latest Ref Rng & Units 12/25/2023    8:37 AM 12/06/2023    8:45 AM 11/27/2023    8:14 AM  CBC  WBC 4.0 - 10.5 K/uL 7.4  6.6  6.5   Hemoglobin 12.0 - 15.0 g/dL 95.6  21.3  08.6   Hematocrit 36.0 - 46.0 % 35.2  38.2  38.1   Platelets 150 - 400 K/uL 270  288  310       Latest Ref Rng & Units 12/25/2023    8:37 AM 12/06/2023    8:45 AM 11/27/2023    8:14 AM  CMP  Glucose 70 - 99 mg/dL 578  469  629   BUN 6 - 20 mg/dL 12  26  24    Creatinine 0.44 - 1.00 mg/dL 5.28  4.13  2.44   Sodium 135 - 145 mmol/L 140  138  141   Potassium 3.5 - 5.1 mmol/L 3.5  3.9  4.4   Chloride 98 - 111 mmol/L 102  101  103   CO2 22 - 32 mmol/L 27  29  29    Calcium   8.9 - 10.3 mg/dL 9.1  9.2  9.7   Total Protein 6.5 - 8.1 g/dL 5.8  6.1  6.1   Total Bilirubin 0.0 - 1.2 mg/dL <1.4  0.2  <7.8   Alkaline Phos 38 - 126 U/L 93  91  91   AST 15 - 41 U/L 13  19  20    ALT 0 - 44 U/L 11  19  15     Lab Results  Component Value Date   CEA1 2.2 07/20/2021   /  CEA  Date Value Ref Range Status  07/20/2021 2.2 0.0 - 4.7 ng/mL Final    Comment:    (NOTE)                             Nonsmokers          <3.9                             Smokers             <5.6 [employer] Electrochemiluminescence Immunoassay (ECLIA) Values obtained with different assay methods or kits cannot be used interchangeably.  Results cannot be interpreted as absolute evidence of the presence or absence of malignant disease. Performed At: [facility] [address] [place], [place] [id number] Pearlean Botts MD MV:[id number]    Lab  Results  Component Value Date   TIBC 251 10/25/2023   TIBC 225 (L) 07/10/2023   TIBC 256 01/14/2023   FERRITIN 129 10/25/2023   FERRITIN 305 07/10/2023   FERRITIN 333 (H) 01/14/2023   IRONPCTSAT 8 (L) 10/25/2023   IRONPCTSAT 11 07/10/2023   IRONPCTSAT 16 01/14/2023   Lab Results  Component Value Date   FOLATE 5.6 (L) 10/25/2023   Lab Results  Component Value Date   VITAMINB12 317 10/25/2023   Lab Results  Component Value Date   LDH 143 10/25/2023   LDH 171 07/10/2023   STUDIES:  EXAM: 09/05/2023 CT CHEST, ABDOMEN, AND PELVIS WITH CONTRAST IMPRESSION: 1. Unchanged size of the mass in the left breast with overlying skin thickening. 2. No significant interval change in the widespread osseous metastatic disease involving the axial and proximal appendicular skeleton. 3. Similar soft tissue thickening about the left subclavian structures and thoracic inlet, possibly reflecting posttreatment change. Suggest continued attention on follow-up imaging. 4. No evidence of new or progressive disease within the chest, abdomen or pelvis. 5.  Aortic Atherosclerosis (ICD10-I70.0).   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   Bipolar disorder (HCC) 11/29/2020   Breast cancer (HCC)    Cancer (HCC)    Cardiac murmur    Chest pain    Chronic fatigue syndrome 11/29/2020   Depression    Essential hypertension 11/29/2020   Hepatic steatosis determined by biopsy of liver 07/04/2020   HLD (hyperlipidemia)    Hypertension    Hypomagnesemia 03/26/2023   Hypothyroidism 11/29/2020   Insomnia 11/29/2020   Iron  deficiency anemia 06/05/2022   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   Migraine 11/29/2020   Mild intermittent asthma 11/29/2020    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   Mixed hyperlipidemia 11/29/2020   Obesity due to excess calories  Osteopenia after menopause 07/04/2020   Other long term (current) drug therapy 11/29/2020   Other vitamin B12 deficiency anemias 11/29/2020   Personal history of malignant neoplasm of breast 11/29/2020   Pre-diabetes    Renal cyst, acquired, right 07/04/2020   14.5 cm in October 2021   Retrocalcaneal bursitis (back of heel), left 04/22/2018   Seasonal allergic rhinitis 11/29/2020   Tightness of heel cord, left 04/22/2018   Tremor 08/02/2023   Type 2 diabetes mellitus without complications (HCC) 11/29/2020   Vitamin D  deficiency 11/29/2020    Past Surgical History:  Procedure Laterality Date   CESAREAN SECTION     IR BONE TUMOR(S)RF ABLATION  05/29/2022   IR BONE TUMOR(S)RF ABLATION  05/29/2022   IR KYPHO EA ADDL LEVEL THORACIC OR LUMBAR  05/29/2022   IR KYPHO THORACIC WITH BONE BIOPSY  05/29/2022   IR RADIOLOGIST EVAL & MGMT  05/22/2022   IR RADIOLOGIST EVAL & MGMT  06/12/2022   LIVER BIOPSY  01/2019   lumpectomy  2014   Left Breast   PORT-A-CATH REMOVAL     PORTACATH PLACEMENT  08/28/2013   STOMACH SURGERY     Tummy Tuck   TOTAL HIP ARTHROPLASTY Right 07/04/2021   Procedure: TOTAL HIP ARTHROPLASTY;  Surgeon: Claiborne Crew, MD;  Location: [facility]S;  Service: Orthopedics;  Laterality: Right;  90   WISDOM TOOTH EXTRACTION      Family History  Problem Relation Age of Onset   Hyperlipidemia Mother    Diabetes Father    Stroke Father    Colon cancer Neg Hx    Colon polyps Neg Hx    Esophageal cancer Neg Hx    Rectal cancer Neg Hx    Stomach cancer Neg Hx     Social History:  reports that she quit smoking about 21 years ago. Her smoking use included cigarettes. She has never used smokeless tobacco. She reports current alcohol use. She reports that she does not use drugs.The patient is accompanied by her husband today.  Allergies:  Allergies  Allergen  Reactions   Reglan  [Metoclopramide ]     Made patient feel really funny when taking this medication    Xgeva  [Denosumab ] Other (See Comments)    Possible ONJ (09/13/23)    Current Medications: Current Outpatient Medications  Medication Sig Dispense Refill   doxycycline  (MONODOX ) 100 MG capsule Take 100 mg by mouth 2 (two) times daily.     albuterol  (VENTOLIN  HFA) 108 (90 Base) MCG/ACT inhaler Inhale 2 puffs into the lungs every 6 (six) hours as needed for shortness of breath.     ARIPiprazole  (ABILIFY ) 5 MG tablet Take 2.5 mg by mouth daily.     aspirin 81 MG EC tablet Take 81 mg by mouth daily. Swallow whole.     atorvastatin  (LIPITOR) 20 MG tablet TAKE ONE TABLET BY MOUTH EVERY DAY 90 tablet 1   augmented betamethasone dipropionate (DIPROLENE-AF) 0.05 % cream      dapagliflozin  propanediol (FARXIGA ) 10 MG TABS tablet TAKE ONE TABLET BY MOUTH EVERY DAY 30 tablet 10   desvenlafaxine  (PRISTIQ ) 50 MG 24 hr tablet Take 50 mg by mouth daily.     dexamethasone  (DECADRON ) 4 MG tablet TAKE ONE TABLET BY MOUTH 2 TIMES A DAY FOR 5 DAYS AFTER CHEMOTHERAPY 20 tablet 2   diphenoxylate -atropine  (LOMOTIL ) 2.5-0.025 MG tablet TAKE TWO TABLETS BY MOUTH FOUR TIMES A DAY AS NEEDED FOR DIARRHEA OR LOOSE STOOLS 100 tablet 1   folic acid  (FOLVITE ) 1 MG tablet Take 1  tablet (1 mg total) by mouth daily. 30 tablet 5   labetalol  (NORMODYNE ) 100 MG tablet Take 1 tablet (100 mg total) by mouth 2 (two) times daily. 180 tablet 3   loratadine  (CLARITIN ) 10 MG tablet TAKE ONE TABLET BY MOUTH EVERY DAY 30 tablet 2   LORazepam  (ATIVAN ) 1 MG tablet TAKE ONE TABLET BY MOUTH EVERY 8 HOURS AS NEEDED FOR ANXIETY   NAUSEA  OR VOMITING 30 tablet 0   magic mouthwash (lidocaine , diphenhydrAMINE , alum & mag hydroxide) suspension Take 5 mLs by mouth every 3 (three) hours as needed. Swish and spit; for throat/mouth pain     naloxone  (NARCAN ) nasal spray 4 mg/0.1 mL One spray in nostril as needed, may repeat every 2 to 3 minutes until  medical assistance becomes available 4 each 1   nitroGLYCERIN  (NITROSTAT ) 0.4 MG SL tablet Place 0.4 mg under the tongue every 5 (five) minutes as needed for chest pain.     nystatin  (MYCOSTATIN /NYSTOP ) powder Apply 1 Application topically 2 (two) times daily.     omeprazole  (PRILOSEC) 40 MG capsule Take 1 capsule (40 mg total) by mouth at bedtime. 30 capsule 5   ondansetron  (ZOFRAN ) 8 MG tablet Take 1 tablet (8 mg total) by mouth every 8 (eight) hours as needed for nausea or vomiting. 60 tablet 5   Oxycodone  HCl 10 MG TABS Take 1 tablet (10 mg total) by mouth every 4 (four) hours as needed. for pain 180 tablet 0   potassium chloride  SA (KLOR-CON  M) 20 MEQ tablet Take 1 tablet (20 mEq total) by mouth 2 (two) times daily. (Patient taking differently: Take 20 mEq by mouth 2 (two) times daily. Taking potasium citrate 1000 mg per two gummies) 60 tablet 1   prochlorperazine  (COMPAZINE ) 10 MG tablet TAKE ONE TABLET BY MOUTH EVERY 6 HOURS AS NEEDED FOR NAUSEA/VOMITING 90 tablet 3   Current Facility-Administered Medications  Medication Dose Route Frequency Provider Last Rate Last Admin   technetium sestamibi generic (CARDIOLITE ) injection 10.5 millicurie  10.5 millicurie Intravenous Once PRN Revankar, Rajan R, MD       Facility-Administered Medications Ordered in Other Visits  Medication Dose Route Frequency Provider Last Rate Last Admin   heparin  lock flush 100 unit/mL  500 Units Intracatheter Once PRN Baldomero Bone A, NP       sodium chloride  flush (NS) 0.9 % injection 10 mL  10 mL Intracatheter PRN Mosher, Kelli A, PA-C   10 mL at 11/15/23 1111   sodium chloride  flush (NS) 0.9 % injection 10 mL  10 mL Intracatheter PRN Mosher, Kelli A, PA-C       sodium chloride  flush (NS) 0.9 % injection 10 mL  10 mL Intravenous PRN Nolia Baumgartner, MD   10 mL at 12/06/23 1109   sodium chloride  flush 0.9 % injection 10 mL  10 mL Intravenous PRN Nolia Baumgartner, MD   10 mL at 11/05/23 1610    I,Jasmine  M Lassiter,acting as a scribe for Nolia Baumgartner, MD.,have documented all relevant documentation on the behalf of Nolia Baumgartner, MD,as directed by  Nolia Baumgartner, MD while in the presence of Nolia Baumgartner, MD.

## 2023-12-25 ENCOUNTER — Other Ambulatory Visit: Payer: Self-pay

## 2023-12-25 ENCOUNTER — Other Ambulatory Visit: Payer: Self-pay | Admitting: Oncology

## 2023-12-25 ENCOUNTER — Inpatient Hospital Stay: Attending: Hematology and Oncology

## 2023-12-25 ENCOUNTER — Encounter: Payer: Self-pay | Admitting: Oncology

## 2023-12-25 ENCOUNTER — Inpatient Hospital Stay

## 2023-12-25 ENCOUNTER — Inpatient Hospital Stay (HOSPITAL_BASED_OUTPATIENT_CLINIC_OR_DEPARTMENT_OTHER): Admitting: Oncology

## 2023-12-25 VITALS — BP 91/67 | HR 94 | Temp 98.3°F | Resp 18 | Ht 62.0 in | Wt 127.6 lb

## 2023-12-25 DIAGNOSIS — M87180 Osteonecrosis due to drugs, jaw: Secondary | ICD-10-CM | POA: Diagnosis not present

## 2023-12-25 DIAGNOSIS — C7951 Secondary malignant neoplasm of bone: Secondary | ICD-10-CM | POA: Insufficient documentation

## 2023-12-25 DIAGNOSIS — D509 Iron deficiency anemia, unspecified: Secondary | ICD-10-CM | POA: Insufficient documentation

## 2023-12-25 DIAGNOSIS — Z170 Estrogen receptor positive status [ER+]: Secondary | ICD-10-CM | POA: Diagnosis not present

## 2023-12-25 DIAGNOSIS — C50412 Malignant neoplasm of upper-outer quadrant of left female breast: Secondary | ICD-10-CM | POA: Insufficient documentation

## 2023-12-25 DIAGNOSIS — Z5112 Encounter for antineoplastic immunotherapy: Secondary | ICD-10-CM | POA: Insufficient documentation

## 2023-12-25 DIAGNOSIS — Z5111 Encounter for antineoplastic chemotherapy: Secondary | ICD-10-CM | POA: Insufficient documentation

## 2023-12-25 DIAGNOSIS — Z923 Personal history of irradiation: Secondary | ICD-10-CM | POA: Diagnosis not present

## 2023-12-25 DIAGNOSIS — D539 Nutritional anemia, unspecified: Secondary | ICD-10-CM

## 2023-12-25 DIAGNOSIS — R251 Tremor, unspecified: Secondary | ICD-10-CM | POA: Insufficient documentation

## 2023-12-25 LAB — CBC WITH DIFFERENTIAL (CANCER CENTER ONLY)
Abs Immature Granulocytes: 0.04 10*3/uL (ref 0.00–0.07)
Basophils Absolute: 0 10*3/uL (ref 0.0–0.1)
Basophils Relative: 0 %
Eosinophils Absolute: 0.2 10*3/uL (ref 0.0–0.5)
Eosinophils Relative: 3 %
HCT: 35.2 % — ABNORMAL LOW (ref 36.0–46.0)
Hemoglobin: 10.7 g/dL — ABNORMAL LOW (ref 12.0–15.0)
Immature Granulocytes: 1 %
Lymphocytes Relative: 8 %
Lymphs Abs: 0.6 10*3/uL — ABNORMAL LOW (ref 0.7–4.0)
MCH: 26.2 pg (ref 26.0–34.0)
MCHC: 30.4 g/dL (ref 30.0–36.0)
MCV: 86.1 fL (ref 80.0–100.0)
Monocytes Absolute: 0.7 10*3/uL (ref 0.1–1.0)
Monocytes Relative: 9 %
Neutro Abs: 5.9 10*3/uL (ref 1.7–7.7)
Neutrophils Relative %: 79 %
Platelet Count: 270 10*3/uL (ref 150–400)
RBC: 4.09 MIL/uL (ref 3.87–5.11)
RDW: 15.8 % — ABNORMAL HIGH (ref 11.5–15.5)
WBC Count: 7.4 10*3/uL (ref 4.0–10.5)
nRBC: 0 % (ref 0.0–0.2)

## 2023-12-25 LAB — CMP (CANCER CENTER ONLY)
ALT: 11 U/L (ref 0–44)
AST: 13 U/L — ABNORMAL LOW (ref 15–41)
Albumin: 3.3 g/dL — ABNORMAL LOW (ref 3.5–5.0)
Alkaline Phosphatase: 93 U/L (ref 38–126)
Anion gap: 11 (ref 5–15)
BUN: 12 mg/dL (ref 6–20)
CO2: 27 mmol/L (ref 22–32)
Calcium: 9.1 mg/dL (ref 8.9–10.3)
Chloride: 102 mmol/L (ref 98–111)
Creatinine: 0.62 mg/dL (ref 0.44–1.00)
GFR, Estimated: >60 mL/min (ref >60)
Glucose, Bld: 151 mg/dL — ABNORMAL HIGH (ref 70–99)
Potassium: 3.5 mmol/L (ref 3.5–5.1)
Sodium: 140 mmol/L (ref 135–145)
Total Bilirubin: <0.2 mg/dL (ref 0.0–1.2)
Total Protein: 5.8 g/dL — ABNORMAL LOW (ref 6.5–8.1)

## 2023-12-25 LAB — MAGNESIUM: Magnesium: 1.9 mg/dL (ref 1.7–2.4)

## 2023-12-25 MED ORDER — HEPARIN SOD (PORK) LOCK FLUSH 100 UNIT/ML IV SOLN
500.0000 [IU] | Freq: Once | INTRAVENOUS | Status: AC | PRN
  Administered 2023-12-25: 500 [IU]

## 2023-12-25 MED ORDER — DIPHENHYDRAMINE HCL 25 MG PO CAPS: 50.0000 mg | ORAL_CAPSULE | Freq: Once | ORAL | Status: DC

## 2023-12-25 MED ORDER — FAM-TRASTUZUMAB DERUXTECAN-NXKI CHEMO 100 MG IV SOLR
5.3000 mg/kg | Freq: Once | INTRAVENOUS | Status: AC
  Administered 2023-12-25: 300 mg via INTRAVENOUS
  Filled 2023-12-25: qty 15

## 2023-12-25 MED ORDER — ACETAMINOPHEN 325 MG PO TABS
650.0000 mg | ORAL_TABLET | Freq: Once | ORAL | Status: DC
Start: 2023-12-25 — End: 2023-12-25

## 2023-12-25 MED ORDER — DEXAMETHASONE SODIUM PHOSPHATE 10 MG/ML IJ SOLN
10.0000 mg | Freq: Once | INTRAMUSCULAR | Status: AC
  Administered 2023-12-25: 10 mg via INTRAVENOUS
  Filled 2023-12-25: qty 1

## 2023-12-25 MED ORDER — SODIUM CHLORIDE 0.9 % IV SOLN
150.0000 mg | Freq: Once | INTRAVENOUS | Status: AC
  Administered 2023-12-25: 150 mg via INTRAVENOUS
  Filled 2023-12-25: qty 150

## 2023-12-25 MED ORDER — DEXTROSE 5 % IV SOLN: Freq: Once | INTRAVENOUS | Status: AC

## 2023-12-25 MED ORDER — PALONOSETRON HCL INJECTION 0.25 MG/5ML
0.2500 mg | Freq: Once | INTRAVENOUS | Status: AC
  Administered 2023-12-25: 0.25 mg via INTRAVENOUS
  Filled 2023-12-25: qty 5

## 2023-12-25 MED ORDER — FULVESTRANT 250 MG/5ML IM SOSY
500.0000 mg | PREFILLED_SYRINGE | Freq: Once | INTRAMUSCULAR | Status: AC
  Administered 2023-12-25: 500 mg via INTRAMUSCULAR
  Filled 2023-12-25: qty 10

## 2023-12-25 MED ORDER — SODIUM CHLORIDE 0.9% FLUSH
10.0000 mL | INTRAVENOUS | Status: DC | PRN
  Administered 2023-12-25: 10 mL

## 2023-12-25 MED ORDER — POTASSIUM CHLORIDE 10 MEQ/100ML IV SOLN
10.0000 meq | INTRAVENOUS | Status: AC
  Administered 2023-12-25 (×2): 10 meq via INTRAVENOUS
  Filled 2023-12-25 (×2): qty 100

## 2023-12-25 NOTE — Progress Notes (Signed)
 Face to face contact with pt in infusion room. Pt reports that she is doing"okay" but has had a rough morning. Pt is accompanied by her husband. Discussed upcoming support group meeting.

## 2023-12-25 NOTE — Patient Instructions (Signed)
 Fulvestrant  Injection What is this medication? FULVESTRANT  (ful VES trant) treats breast cancer. It works by blocking the hormone estrogen in breast tissue, which prevents breast cancer cells from spreading or growing. This medicine may be used for other purposes; ask your health care provider or pharmacist if you have questions. COMMON BRAND NAME(S): FASLODEX  What should I tell my care team before I take this medication? They need to know if you have any of these conditions: Bleeding disorder Liver disease Low blood cell levels (white cells, red cells, and platelets) An unusual or allergic reaction to fulvestrant , other medications, foods, dyes, or preservatives Pregnant or trying to get pregnant Breastfeeding How should I use this medication? This medication is injected into a muscle. It is given by your care team in a hospital or clinic setting. Talk to your care team about the use of this medication in children. Special care may be needed. Overdosage: If you think you have taken too much of this medicine contact a poison control center or emergency room at once. NOTE: This medicine is only for you. Do not share this medicine with others. What if I miss a dose? Keep appointments for follow-up doses. It is important not to miss your dose. Call your care team if you are unable to keep an appointment. What may interact with this medication? Fluoroestradiol F18 This list may not describe all possible interactions. Give your health care provider a list of all the medicines, herbs, non-prescription drugs, or dietary supplements you use. Also tell them if you smoke, drink alcohol, or use illegal drugs. Some items may interact with your medicine. What should I watch for while using this medication? Your condition will be monitored carefully while you are receiving this medication. You may need blood work done while you are taking this medication. This medication is injected into a muscle. Talk  to your care team if you also take medications that prevent or treat blood clots, such as warfarin. Blood thinners may increase the risk of bleeding or bruising in the muscle where this medication is injected. The benefits of this medication may outweigh the risks. Your care team can help you find the option that works for you. They can also help limit the risk of bleeding. Talk to your care team if you may be pregnant. Serious birth defects can occur if you take this medication during pregnancy and for 1 year after the last dose. You will need a negative pregnancy test before starting this medication. Contraception is recommended while taking this medication and for 1 year after the last dose. Your care team can help you find the option that works for you. Do not breastfeed while taking this medication and for 1 year after the last dose. This medication may cause infertility. Talk to your care team if you are concerned about your fertility. What side effects may I notice from receiving this medication? Side effects that you should report to your care team as soon as possible: Allergic reactions or angioedema--skin rash, itching or hives, swelling of the face, eyes, lips, tongue, arms, or legs, trouble swallowing or breathing Pain, tingling, or numbness in the hands or feet Side effects that usually do not require medical attention (report to your care team if they continue or are bothersome): Bone, joint, or muscle pain Constipation Headache Hot flashes Nausea Pain, redness, or irritation at injection site Unusual weakness or fatigue This list may not describe all possible side effects. Call your doctor for medical advice about side  effects. You may report side effects to FDA at 1-800-FDA-1088. Where should I keep my medication? This medication is given in a hospital or clinic. It will not be stored at home. NOTE: This sheet is a summary. It may not cover all possible information. If you have  questions about this medicine, talk to your doctor, pharmacist, or health care provider.  2024 Elsevier/Gold Standard (2023-04-05 00:00:00)Fam-Trastuzumab  Deruxtecan Injection What is this medication? FAM-TRASTUZUMAB  DERUXTECAN (fam-tras TOOZ eu mab DER ux TEE kan) treats some types of cancer. It works by blocking a protein that causes cancer cells to grow and multiply. This helps to slow or stop the spread of cancer cells. This medicine may be used for other purposes; ask your health care provider or pharmacist if you have questions. COMMON BRAND NAME(S): ENHERTU  What should I tell my care team before I take this medication? They need to know if you have any of these conditions: Heart disease Heart failure Infection, especially a viral infection, such as chickenpox, cold sores, or herpes Liver disease Lung or breathing disease, such as asthma or COPD An unusual or allergic reaction to fam-trastuzumab  deruxtecan, other medications, foods, dyes, or preservatives Pregnant or trying to get pregnant Breast-feeding How should I use this medication? This medication is injected into a vein. It is given by your care team in a hospital or clinic setting. A special MedGuide will be given to you before each treatment. Be sure to read this information carefully each time. Talk to your care team about the use of this medication in children. Special care may be needed. Overdosage: If you think you have taken too much of this medicine contact a poison control center or emergency room at once. NOTE: This medicine is only for you. Do not share this medicine with others. What if I miss a dose? It is important not to miss your dose. Call your care team if you are unable to keep an appointment. What may interact with this medication? Interactions are not expected. This list may not describe all possible interactions. Give your health care provider a list of all the medicines, herbs, non-prescription drugs, or  dietary supplements you use. Also tell them if you smoke, drink alcohol, or use illegal drugs. Some items may interact with your medicine. What should I watch for while using this medication? Visit your care team for regular checks on your progress. Tell your care team if your symptoms do not start to get better or if they get worse. This medication may increase your risk of getting an infection. Call your care team for advice if you get a fever, chills, sore throat, or other symptoms of a cold or flu. Do not treat yourself. Try to avoid being around people who are sick. Avoid taking medications that contain aspirin, acetaminophen , ibuprofen, naproxen, or ketoprofen unless instructed by your care team. These medications may hide a fever. Be careful brushing or flossing your teeth or using a toothpick because you may get an infection or bleed more easily. If you have any dental work done, tell your dentist you are receiving this medication. This medication may cause dry eyes and blurred vision. If you wear contact lenses, you may feel some discomfort. Lubricating eye drops may help. See your care team if the problem does not go away or is severe. Talk to your care team if you may be pregnant. Serious birth defects can occur if you take this medication during pregnancy and for 7 months after the last dose.  If your partner can get pregnant, use a condom during sex while taking this medication and for 4 months after the last dose. Do not breastfeed while taking this medication and for 7 months after the last dose. This medication may cause infertility. Talk to your care team if you are concerned about your fertility. What side effects may I notice from receiving this medication? Side effects that you should report to your care team as soon as possible: Allergic reactions--skin rash, itching, hives, swelling of the face, lips, tongue, or throat Dry cough, shortness of breath or trouble  breathing Infection--fever, chills, cough, sore throat, wounds that don't heal, pain or trouble when passing urine, general feeling of discomfort or being unwell Heart failure--shortness of breath, swelling of the ankles, feet, or hands, sudden weight gain, unusual weakness or fatigue Unusual bruising or bleeding Side effects that usually do not require medical attention (report these to your care team if they continue or are bothersome): Constipation Diarrhea Hair loss Muscle pain Nausea Vomiting This list may not describe all possible side effects. Call your doctor for medical advice about side effects. You may report side effects to FDA at 1-800-FDA-1088. Where should I keep my medication? This medication is given in a hospital or clinic. It will not be stored at home. NOTE: This sheet is a summary. It may not cover all possible information. If you have questions about this medicine, talk to your doctor, pharmacist, or health care provider.  2024 Elsevier/Gold Standard (2023-03-29 00:00:00)

## 2023-12-26 LAB — CANCER ANTIGEN 27.29: CA 27.29: 22 U/mL (ref 0.0–38.6)

## 2023-12-31 ENCOUNTER — Ambulatory Visit: Payer: Self-pay | Admitting: Cardiology

## 2024-01-01 ENCOUNTER — Encounter: Payer: Self-pay | Admitting: Oncology

## 2024-01-09 ENCOUNTER — Other Ambulatory Visit: Payer: Self-pay | Admitting: Nurse Practitioner

## 2024-01-10 ENCOUNTER — Encounter: Payer: Self-pay | Admitting: Oncology

## 2024-01-17 ENCOUNTER — Other Ambulatory Visit: Payer: Self-pay | Admitting: Cardiology

## 2024-01-17 ENCOUNTER — Other Ambulatory Visit: Payer: Self-pay | Admitting: Oncology

## 2024-01-19 ENCOUNTER — Encounter: Payer: Self-pay | Admitting: Oncology

## 2024-01-20 ENCOUNTER — Encounter: Payer: Self-pay | Admitting: Cardiology

## 2024-01-20 NOTE — Assessment & Plan Note (Addendum)
 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 Ki67 was 60%. PET imaging in December revealed dominant finding of intensely hypermetabolic aggressive skeletal metastasis involving the calvarium, sternum, thoracic and lumbar spine, and pelvis, with potential pathologic fracture of the right femur with internal fixation. There was soft tissue extension into the anterior mediastinum associated with the manubrial metastasis. Multiple spinal vertebral body lesions had cortical destruction along the posterior margin. She started monthly Xgeva  in December, and completed radiation therapy. She was receiving THP, but cycle 4 was delayed and dose reduction made by 20% due to left leg skin infection.    She continued with Herceptin /Perjeta  until she had progression of disease. In January 2024, she developed increased pain of the left hip. She was found to have innumerable metastatic lesions throughout the pelvis and proximal femur. There is a non displaced pathologic fracture through a large lesion in the superior left acetabulum. She was referred to an orthopedic oncologist at [facility] and they recommended radiation and no surgery. She received her palliative radiation. She followed-up with [facility] in April and they do not recommend surgery, but will continue surveillance.    We then switched her to Enhertu  chemotherapy, but she has had significant toxicities of nausea, vomiting, diarrhea, and hypokalemia.  She was also placed on hormonal therapy with Faslodex  injections.  Repeat CT imaging in July revealed decreased soft tissue thickening around the left subclavian structures and thoracic inlet.  Left lateral breast mass was stable.  Widespread mixed lytic and sclerotic  osseous metastatic lesions were stable.  There was unchanged severe elevation of the left hemidiaphragm.  She continues denosumab  for her bone metastases, which was being given every 6 weeks to coincide with chemotherapy.   CT neck in October to evaluate for possible soft tissue mass and painful swallowing did not reveal any soft tissue masses.  The extensive mixed lytic and sclerotic skeletal metastases were much more pronounced in the visible upper thorax than in the cervical spine and were felt to be grossly stable from July 2024. CT chest, abdomen and pelvis in November revealed stable bony disease without evidence of new/progressive disease.  Due to the development of osteonecrosis of the jaw, denosumab  was discontinued 2025   She completed 18 cycles of Enhertu , which was then placed on hold due to severe anemia.  She required multiple units of blood. She was found to be iron  deficient and received IV iron  in the form of Venofer  for 5 doses. She was referred to Dr. Venice Gillis to consider evaluation of the iron  deficiency.  She is scheduled to see him June 19.  We resumed Enhertu  in April.  She has continued fulvestrant  monthly.  In May, patient requested she receive Enhertu  every 4 weeks.  She will proceed with a 21st cycle of Enhertu , as well as fulvestrant , today.  We will plan to see her back in 4 weeks with a CBC, comprehensive metabolic panel and CT chest, abdomen and pelvis prior to a 22nd cycle of Enhertu .

## 2024-01-20 NOTE — Assessment & Plan Note (Signed)
 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  She was treated with lumpectomy followed by adjuvant chemotherapy and radiation therapy. She was on adjuvant hormonal therapy with tamoxifen 20 mg daily from October 2015 to May 2020, but due to elevation of the liver transaminases was switched to anastrazole.  She then developed bone metastasis in November 2022 so has been receiving chemotherapy. She had skin thickening on her last mammogram, but a nodule in the left breast was stable on CT imaging. The skin thickening is felt to possibly due to radiation, as her exam remained stable. Continued mammograms are not recommended.

## 2024-01-20 NOTE — Progress Notes (Addendum)
 [facility]  [address] [place],  [place]  [address] [phone]    Addendum: Correction to physical examination: Oral mucosa is dry.  Skin turgor is decreased.  There is mild bilateral lower extremity edema.  The legs are wrapped.  Clinic Day:  01/24/2024  Referring physician: Georgean Kindle, MD  ASSESSMENT & PLAN:   Assessment & Plan: Malignant neoplasm metastatic to bone [facility]) Bone metastases diagnosed in November 2022, with multiple marrow replacing lesions within the proximal right femur within the ischium as well as the inferior rami. 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 Ki67 was 60%. PET imaging in December revealed dominant finding of intensely hypermetabolic aggressive skeletal metastasis involving the calvarium, sternum, thoracic and lumbar spine, and pelvis, with potential pathologic fracture of the right femur with internal fixation. There was soft tissue extension into the anterior mediastinum associated with the manubrial metastasis. Multiple spinal vertebral body lesions had cortical destruction along the posterior margin. She started monthly Xgeva  in December, and completed radiation therapy. She was receiving THP, but cycle 4 was delayed and dose reduction made by 20% due to left leg skin infection.    She continued with Herceptin /Perjeta  until she had progression of disease. In January 2024, she developed increased pain of the left hip. She was found to have innumerable metastatic lesions throughout the pelvis and proximal femur. There is a non displaced pathologic fracture through a large lesion in the superior left acetabulum. She was referred to an orthopedic oncologist at [facility] and they recommended radiation and no surgery. She received her palliative radiation. She followed-up with [facility] in April and they do not recommend surgery, but will continue  surveillance.    We then switched her to Enhertu  chemotherapy, but she has had significant toxicities of nausea, vomiting, diarrhea, and hypokalemia.  She was also placed on hormonal therapy with Faslodex  injections.  Repeat CT imaging in July revealed decreased soft tissue thickening around the left subclavian structures and thoracic inlet.  Left lateral breast mass was stable.  Widespread mixed lytic and sclerotic osseous metastatic lesions were stable.  There was unchanged severe elevation of the left hemidiaphragm.  She continues denosumab  for her bone metastases, which was being given every 6 weeks to coincide with chemotherapy.   CT neck in October to evaluate for possible soft tissue mass and painful swallowing did not reveal any soft tissue masses.  The extensive mixed lytic and sclerotic skeletal metastases were much more pronounced in the visible upper thorax than in the cervical spine and were felt to be grossly stable from July 2024. CT chest, abdomen and pelvis in November revealed stable bony disease without evidence of new/progressive disease.  Due to the development of osteonecrosis of the jaw, denosumab  was discontinued 2025   She completed 18 cycles of Enhertu , which was then placed on hold due to severe anemia.  She required multiple units of blood. She was found to be iron  deficient and received IV iron  in the form of Venofer  for 5 doses. She was referred to Dr. Venice Gillis to consider evaluation of the iron  deficiency.  She is scheduled to see him June 19.  We resumed Enhertu  in April.  She has continued fulvestrant  monthly.  In May, patient requested she receive Enhertu  every 4 weeks.  She will proceed with a 21st cycle of Enhertu , as well as fulvestrant , today.  We will plan  to see her back in 4 weeks with a CBC, comprehensive metabolic panel and CT chest, abdomen and pelvis prior to a 22nd cycle of Enhertu .  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  She was treated with lumpectomy followed by adjuvant chemotherapy and radiation therapy. She was on adjuvant hormonal therapy with tamoxifen 20 mg daily from October 2015 to May 2020, but due to elevation of the liver transaminases was switched to anastrazole.  She then developed bone metastasis in November 2022 so has been receiving chemotherapy. She had skin thickening on her last mammogram, but a nodule in the left breast was stable on CT imaging. The skin thickening is felt to possibly due to radiation, as her exam remained stable. Continued mammograms are not recommended.  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 She developed pain of the right jaw in February 2025.  Panorex x-rays revealed an abnormal area in the right lower jaw consistent with osteonecrosis.  Xgeva  was discontinued.  She was treated with Augmentin  in February.  She has been working closely with her dentist. She has had 2 fillings done, but also may need root canals.  She sees him again on April 8.  She had swelling and erythema of the left jaw earlier this month, so was placed on amoxicillin  4 times daily, with resolution of the swelling and erythema.  The redness and swelling have resolved, but she states she still has some mild tenderness of the anterior left jaw.  She will contact us  if she has recurrent swelling and erythema.  Iron  deficiency anemia New iron  deficiency anemia diagnosed in March, when she presented with acute worsening of her anemia. She required transfusion of 3 units of PRBC's. She then received IV iron  in the form of Venofer  for 5 doses. She is taking oral iron  now.  She was referred to Dr. Venice Gillis for consideration of evaluation of the iron  deficiency.  Her hemoglobin has slightly improved, which may be due to hemoconcentration.  Repeat iron  studies are pending.  Dehydration She is clinically  dehydrated today.  I will give her additional IV fluids.  She noticed push clear fluids at home.    The patient understands the plans discussed today and is in agreement with them.  She knows to contact our office if she develops concerns prior to her next appointment.   I provided 30 minutes of face-to-face time during this encounter and > 50% was spent counseling as documented under my assessment and plan.    Bintou Lafata A Deyonna Fitzsimmons, PA-C  [facility] [place] - A DEPT OF [facility]. [facility] [address] [place] [address] Dept: [phone] Dept Fax: [phone]   Orders Placed This Encounter  Procedures   Urine Culture    Standing Status:   Future    Expected Date:   01/24/2024    Expiration Date:   01/23/2025   CT CHEST ABDOMEN PELVIS W CONTRAST    Standing Status:   Future    Expected Date:   02/13/2024    Expiration Date:   01/23/2025    If indicated for the ordered procedure, I authorize the administration of contrast media per Radiology protocol:   Yes    Does the patient have a contrast media/X-ray dye allergy?:   No    Is patient pregnant?:  No    Preferred imaging location?:   [facility]    If indicated for the ordered procedure, I authorize the administration of oral contrast media per Radiology protocol:   Yes   Urinalysis, Complete w Microscopic    Standing Status:   Future    Expected Date:   01/24/2024    Expiration Date:   01/23/2025      CHIEF COMPLAINT:  CC: Metastatic hormone and HER2 receptor positive breast cancer  Current Treatment: Enhertu  every 4 weeks  HISTORY OF PRESENT ILLNESS:   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 (HCC)   07/23/2013 Cancer Staging   Staging form: Breast, AJCC 7th Edition - Clinical stage from 07/23/2013: Stage IIB (T2, N1, M0) - Signed  by Nolia Baumgartner, MD on 07/04/2020 Prognostic indicators: Pos LVI   09/22/2021 - 04/06/2022 Chemotherapy   Patient is on Treatment Plan : BREAST  Trastuzumab  + Pertuzumab  q21d      09/22/2021 - 09/21/2022 Chemotherapy   Patient is on Treatment Plan : BREAST Trastuzumab  + Pertuzumab  q21d     10/12/2022 -  Chemotherapy   Patient is on Treatment Plan : BREAST METASTATIC Fam-Trastuzumab Deruxtecan-nxki  (Enhertu ) (5.4) q28d (changed from q21d on 12/25/23)     Malignant neoplasm metastatic to bone (HCC)  06/28/2021 Initial Diagnosis   Bone metastases (HCC)   09/22/2021 - 04/06/2022 Chemotherapy   Patient is on Treatment Plan : BREAST  Trastuzumab  + Pertuzumab  q21d      09/22/2021 - 09/21/2022 Chemotherapy   Patient is on Treatment Plan : BREAST Trastuzumab  + Pertuzumab  q21d     05/11/2022 Imaging   CT chest, abdomen and pelvis:  IMPRESSION:  1. Widespread bony metastatic disease shows increased sclerosis  since previous imaging. This is likely related to response to  therapy in the interval. There is a single lesion along the LEFT  iliac which shows continued lytic change and warrants attention on  follow-up  2. Interval development of pathologic fractures at in the upper  thoracic spine and at the thoracolumbar junction with associated  kyphosis.  3. No signs of solid organ or nodal metastatic disease.  4. Marked elevation of the LEFT hemidiaphragm as on the prior PET.  5. Aortic atherosclerosis.    10/12/2022 -  Chemotherapy   Patient is on Treatment Plan : BREAST METASTATIC Fam-Trastuzumab Deruxtecan-nxki  (Enhertu ) (5.4) q28d (changed from q21d on 12/25/23)         INTERVAL HISTORY:   Anna [name] is here today for repeat clinical assessment prior to a 21st cycle of Enhertu , which she is now receiving every 4 weeks.  She continues fulvestrant  monthly as well.  She reports dysuria and urinary incontinence.  I will obtain a urine for urinalysis and urine culture.  She is scheduled to see  the dentist next week but reports swelling under her chin.  She has trouble chewing due to the her teeth.  She was referred to Dr. Venice Gillis for evaluation of iron  deficiency and she sees him on June 16.  She denies any overt blood loss.  She continues to follow at the wound clinic for bilateral lower extremity ulcers.  She denies fevers or chills. She states her pain is fairly well-controlled with oxycodone .  She has not tolerated long-acting narcotics. Her appetite is good. Her weight has been stable.  REVIEW OF SYSTEMS:   Review of Systems  Constitutional:  Negative for appetite change, chills, fatigue, fever and unexpected weight change.  HENT:   Negative for lump/mass, mouth sores, sore throat and trouble swallowing.   Respiratory:  Negative for cough and shortness of breath.   Cardiovascular:  Negative for chest pain and leg swelling.  Gastrointestinal:  Positive for nausea (Intermittent, medication effective). Negative for abdominal pain, constipation, diarrhea and vomiting.  Genitourinary:  Positive for bladder incontinence and dysuria. Negative for difficulty urinating, frequency and hematuria.   Musculoskeletal:  Positive for back pain (Lower back) and neck pain. Negative for arthralgias, gait problem and myalgias.  Skin:  Positive for wound (Bilateral leg ulcers). Negative for rash.  Neurological:  Negative for dizziness, extremity weakness, gait problem, headaches, light-headedness and numbness.  Hematological:  Negative for adenopathy. Does not bruise/bleed easily.  Psychiatric/Behavioral:  Negative for depression and sleep disturbance. The patient is not nervous/anxious.      VITALS:   Blood pressure 99/73, pulse 94, temperature 98.6 F (37 C), temperature source Oral, resp. rate 18, height 5' 2 (1.575 m), weight 126 lb 1.6 oz (57.2 kg), last menstrual period 08/18/2013, SpO2 97%.  Wt Readings from Last 3 Encounters:  01/24/24 126 lb 1.6 oz (57.2 kg)  12/25/23 127 lb 9.6 oz (57.9  kg)  12/06/23 123 lb 14.4 oz (56.2 kg)    Body mass index is 23.06 kg/m.  Performance status (ECOG): 1 - Symptomatic but completely ambulatory    PHYSICAL EXAM:   Physical Exam Vitals and nursing note reviewed.  Constitutional:      General: She is not in acute distress.    Appearance: She is normal weight. She is ill-appearing (Chronically ill-appearing).  HENT:     Head: Normocephalic and atraumatic.     Comments: Swelling and erythema in the submental area    Mouth/Throat:     Mouth: Mucous membranes are dry.     Pharynx: Oropharynx is clear. No oropharyngeal exudate or posterior oropharyngeal erythema.   Eyes:     General: No scleral icterus.    Extraocular Movements: Extraocular movements intact.     Conjunctiva/sclera: Conjunctivae normal.     Pupils: Pupils are equal, round, and reactive to light.    Cardiovascular:     Rate and Rhythm: Normal rate and regular rhythm.     Heart sounds: Normal heart sounds. No murmur heard.    No friction rub. No gallop.  Pulmonary:     Effort: Pulmonary effort is normal.     Breath sounds: Normal breath sounds. No wheezing, rhonchi or rales.  Chest:     Comments: Breast exam is deferred Abdominal:     General: There is no distension.     Palpations: Abdomen is soft. There is no hepatomegaly, splenomegaly or mass.     Tenderness: There is no abdominal tenderness.   Musculoskeletal:        General: Normal range of motion.     Cervical back: Normal range of motion and neck supple. No tenderness.     Right lower leg: Edema present.     Left lower leg: Edema present.  Lymphadenopathy:     Cervical: No cervical adenopathy.     Upper Body:     Right upper body: No supraclavicular or axillary adenopathy.     Left upper body: No supraclavicular or axillary adenopathy.     Lower Body: No right inguinal adenopathy. No left inguinal adenopathy.   Skin:    General: Skin is warm and dry.     Coloration: Skin  is not jaundiced.      Findings: No rash.   Neurological:     Mental Status: She is alert and oriented to person, place, and time.     Cranial Nerves: No cranial nerve deficit.   Psychiatric:        Mood and Affect: Mood normal.        Behavior: Behavior normal.        Thought Content: Thought content normal.     LABS:      Latest Ref Rng & Units 01/24/2024    9:39 AM 12/25/2023    8:37 AM 12/06/2023    8:45 AM  CBC  WBC 4.0 - 10.5 K/uL 7.0  7.4  6.6   Hemoglobin 12.0 - 15.0 g/dL 16.1  09.6  04.5   Hematocrit 36.0 - 46.0 % 36.9  35.2  38.2   Platelets 150 - 400 K/uL 321  270  288       Latest Ref Rng & Units 01/24/2024    9:39 AM 12/25/2023    8:37 AM 12/06/2023    8:45 AM  CMP  Glucose 70 - 99 mg/dL 409  811  914   BUN 6 - 20 mg/dL 24  12  26    Creatinine 0.44 - 1.00 mg/dL 7.82  9.56  2.13   Sodium 135 - 145 mmol/L 140  140  138   Potassium 3.5 - 5.1 mmol/L 4.1  3.5  3.9   Chloride 98 - 111 mmol/L 104  102  101   CO2 22 - 32 mmol/L 26  27  29    Calcium  8.9 - 10.3 mg/dL 9.7  9.1  9.2   Total Protein 6.5 - 8.1 g/dL 6.1  5.8  6.1   Total Bilirubin 0.0 - 1.2 mg/dL <0.8  <6.5  0.2   Alkaline Phos 38 - 126 U/L 95  93  91   AST 15 - 41 U/L 14  13  19    ALT 0 - 44 U/L 10  11  19       Lab Results  Component Value Date   CEA1 2.2 07/20/2021   /  CEA  Date Value Ref Range Status  07/20/2021 2.2 0.0 - 4.7 ng/mL Final    Comment:    (NOTE)                             Nonsmokers          <3.9                             Smokers             <5.6 [employer] Electrochemiluminescence Immunoassay (ECLIA) Values obtained with different assay methods or kits cannot be used interchangeably.  Results cannot be interpreted as absolute evidence of the presence or absence of malignant disease. Performed At: [facility] [address] [place], [place] [id number] Pearlean Botts MD MW:[id number]     Lab Results  Component Value Date   TIBC 251 10/25/2023   TIBC 225 (L) 07/10/2023    TIBC 256 01/14/2023   FERRITIN 129 10/25/2023   FERRITIN 305 07/10/2023   FERRITIN 333 (H) 01/14/2023   IRONPCTSAT 8 (L) 10/25/2023   IRONPCTSAT 11 07/10/2023   IRONPCTSAT 16 01/14/2023   Lab Results  Component Value Date   LDH 143 10/25/2023   LDH 171 07/10/2023  STUDIES:   No results found.    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   Bipolar disorder (HCC) 11/29/2020   Breast cancer (HCC)    Cancer (HCC)    Cardiac murmur    Chest pain    Chronic fatigue syndrome 11/29/2020   Depression    Essential hypertension 11/29/2020   Hepatic steatosis determined by biopsy of liver 07/04/2020   HLD (hyperlipidemia)    Hypertension    Hypomagnesemia 03/26/2023   Hypothyroidism 11/29/2020   Insomnia 11/29/2020   Iron  deficiency anemia 06/05/2022   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   Migraine 11/29/2020   Mild intermittent asthma 11/29/2020   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   Mixed hyperlipidemia 11/29/2020   Obesity due to excess calories    Osteopenia after menopause 07/04/2020   Other long term (current) drug therapy 11/29/2020   Other vitamin B12 deficiency anemias 11/29/2020   Personal history of malignant neoplasm of breast 11/29/2020   Pre-diabetes    Renal cyst, acquired, right 07/04/2020   14.5 cm in October 2021   Retrocalcaneal bursitis (back of heel), left 04/22/2018   Seasonal allergic rhinitis 11/29/2020   Tightness of heel cord, left 04/22/2018   Tremor 08/02/2023   Type 2 diabetes mellitus without complications (HCC) 11/29/2020   Vitamin D  deficiency 11/29/2020    Past Surgical History:  Procedure Laterality Date   CESAREAN SECTION     IR BONE TUMOR(S)RF ABLATION   05/29/2022   IR BONE TUMOR(S)RF ABLATION  05/29/2022   IR KYPHO EA ADDL LEVEL THORACIC OR LUMBAR  05/29/2022   IR KYPHO THORACIC WITH BONE BIOPSY  05/29/2022   IR RADIOLOGIST EVAL & MGMT  05/22/2022   IR RADIOLOGIST EVAL & MGMT  06/12/2022   LIVER BIOPSY  01/2019   lumpectomy  2014   Left Breast   PORT-A-CATH REMOVAL     PORTACATH PLACEMENT  08/28/2013   STOMACH SURGERY     Tummy Tuck   TOTAL HIP ARTHROPLASTY Right 07/04/2021   Procedure: TOTAL HIP ARTHROPLASTY;  Surgeon: Claiborne Crew, MD;  Location: [facility];  Service: Orthopedics;  Laterality: Right;  90   WISDOM TOOTH EXTRACTION      Family History  Problem Relation Age of Onset   Hyperlipidemia Mother    Diabetes Father    Stroke Father    Colon cancer Neg Hx    Colon polyps Neg Hx    Esophageal cancer Neg Hx    Rectal cancer Neg Hx    Stomach cancer Neg Hx     Social History:  reports that she quit smoking about 21 years ago. Her smoking use included cigarettes. She has never used smokeless tobacco. She reports current alcohol use. She reports that she does not use drugs.The patient is accompanied by her husband today.  Allergies:  Allergies  Allergen Reactions   Reglan  [Metoclopramide ]     Made patient feel really funny when taking this medication    Xgeva  [Denosumab ] Other (See Comments)    Possible ONJ (09/13/23)    Current Medications: Current Outpatient Medications  Medication Sig Dispense Refill   albuterol  (VENTOLIN  HFA) 108 (90 Base) MCG/ACT inhaler Inhale 2 puffs into the lungs every 6 (six) hours as needed for shortness of breath.  ARIPiprazole  (ABILIFY ) 5 MG tablet Take 2.5 mg by mouth daily.     aspirin 81 MG EC tablet Take 81 mg by mouth daily. Swallow whole.     atorvastatin  (LIPITOR) 20 MG tablet Take 1 tablet (20 mg total) by mouth daily. 90 tablet 3   dapagliflozin  propanediol (FARXIGA ) 10 MG TABS tablet TAKE ONE TABLET BY MOUTH EVERY DAY 30 tablet 10   desvenlafaxine  (PRISTIQ ) 50 MG 24 hr  tablet Take 50 mg by mouth daily.     dexamethasone  (DECADRON ) 4 MG tablet TAKE ONE TABLET BY MOUTH 2 TIMES A DAY FOR 5 DAYS AFTER CHEMOTHERAPY 20 tablet 2   diphenoxylate -atropine  (LOMOTIL ) 2.5-0.025 MG tablet TAKE TWO TABLETS BY MOUTH FOUR TIMES A DAY AS NEEDED FOR DIARRHEA OR LOOSE STOOLS 100 tablet 1   folic acid  (FOLVITE ) 1 MG tablet Take 1 tablet (1 mg total) by mouth daily. 30 tablet 5   labetalol  (NORMODYNE ) 100 MG tablet Take 1 tablet (100 mg total) by mouth 2 (two) times daily. 180 tablet 3   loratadine  (CLARITIN ) 10 MG tablet TAKE ONE TABLET BY MOUTH EVERY DAY 30 tablet 5   LORazepam  (ATIVAN ) 1 MG tablet TAKE ONE TABLET BY MOUTH EVERY 8 HOURS AS NEEDED FOR ANXIETY   NAUSEA  OR VOMITING 30 tablet 0   magic mouthwash (lidocaine , diphenhydrAMINE , alum & mag hydroxide) suspension Take 5 mLs by mouth every 3 (three) hours as needed. Swish and spit; for throat/mouth pain     naloxone  (NARCAN ) nasal spray 4 mg/0.1 mL One spray in nostril as needed, may repeat every 2 to 3 minutes until medical assistance becomes available 4 each 1   nitroGLYCERIN  (NITROSTAT ) 0.4 MG SL tablet Place 0.4 mg under the tongue every 5 (five) minutes as needed for chest pain.     nystatin  (MYCOSTATIN /NYSTOP ) powder Apply 1 Application topically 2 (two) times daily.     omeprazole  (PRILOSEC) 40 MG capsule Take 1 capsule (40 mg total) by mouth at bedtime. 30 capsule 5   ondansetron  (ZOFRAN ) 8 MG tablet Take 1 tablet (8 mg total) by mouth every 8 (eight) hours as needed for nausea or vomiting. 60 tablet 5   Oxycodone  HCl 10 MG TABS TAKE ONE TABLET BY MOUTH EVERY 4 HOURS AS NEEDED FOR PAIN 180 tablet 0   potassium chloride  SA (KLOR-CON  M) 20 MEQ tablet Take 1 tablet (20 mEq total) by mouth 2 (two) times daily. (Patient taking differently: Take 20 mEq by mouth 2 (two) times daily. Taking potasium citrate 1000 mg per two gummies) 60 tablet 1   prochlorperazine  (COMPAZINE ) 10 MG tablet TAKE ONE TABLET BY MOUTH EVERY 6 HOURS AS  NEEDED FOR NAUSEA/VOMITING 90 tablet 3   amoxicillin  (AMOXIL ) 500 MG capsule TAKE ONE CAPSULE BY MOUTH FOUR TIMES A DAY 40 capsule 0   augmented betamethasone dipropionate (DIPROLENE-AF) 0.05 % cream  (Patient not taking: Reported on 01/24/2024)     doxycycline  (MONODOX ) 100 MG capsule Take 100 mg by mouth 2 (two) times daily. (Patient not taking: Reported on 01/24/2024)     Vibegron (GEMTESA) 75 MG TABS Take 1 tablet by mouth daily. (Patient not taking: Reported on 01/24/2024)     Current Facility-Administered Medications  Medication Dose Route Frequency Provider Last Rate Last Admin   technetium sestamibi generic (CARDIOLITE ) injection 10.5 millicurie  10.5 millicurie Intravenous Once PRN Revankar, Rajan R, MD       Facility-Administered Medications Ordered in Other Visits  Medication Dose Route Frequency Provider Last Rate Last Admin  0.9 %  sodium chloride  infusion   Intravenous Once Nylah Butkus A, PA-C 500 mL/hr at 01/24/24 1043 New Bag at 01/24/24 1043   acetaminophen  (TYLENOL ) tablet 650 mg  650 mg Oral Once Nolia Baumgartner, MD       dextrose  5 % solution   Intravenous Once Nolia Baumgartner, MD       diphenhydrAMINE  (BENADRYL ) capsule 50 mg  50 mg Oral Once Nolia Baumgartner, MD       fam-trastuzumab deruxtecan-nxki  (ENHERTU ) 300 mg in dextrose  5 % 100 mL chemo infusion  5.3 mg/kg (Treatment Plan Recorded) Intravenous Once Nolia Baumgartner, MD       fulvestrant  (FASLODEX ) injection 500 mg  500 mg Intramuscular Once Nolia Baumgartner, MD       heparin  lock flush 100 unit/mL  500 Units Intracatheter Once PRN Baldomero Bone A, NP       heparin  lock flush 100 unit/mL  500 Units Intracatheter Once PRN Nolia Baumgartner, MD       sodium chloride  flush (NS) 0.9 % injection 10 mL  10 mL Intracatheter PRN Danajah Birdsell A, PA-C   10 mL at 11/15/23 1111   sodium chloride  flush (NS) 0.9 % injection 10 mL  10 mL Intracatheter PRN Jachob Mcclean A, PA-C       sodium chloride   flush (NS) 0.9 % injection 10 mL  10 mL Intravenous PRN Nolia Baumgartner, MD   10 mL at 12/06/23 1109   sodium chloride  flush (NS) 0.9 % injection 10 mL  10 mL Intracatheter PRN Nolia Baumgartner, MD       sodium chloride  flush 0.9 % injection 10 mL  10 mL Intravenous PRN Nolia Baumgartner, MD   10 mL at 11/05/23 [phone]

## 2024-01-20 NOTE — Assessment & Plan Note (Addendum)
 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 She developed pain of the right jaw in February 2025.  [facility]orex x-rays revealed an abnormal area in the right lower jaw consistent with osteonecrosis.  Xgeva  was discontinued.  She has intermittently required antibiotics for infection.  There is swelling and erythema in the submental area.  I will place her on amoxicillin  500 mL 4 times for 10 days again at this time.  She is scheduled to see her dentist again next week.

## 2024-01-20 NOTE — Assessment & Plan Note (Addendum)
 New iron  deficiency anemia diagnosed in March, when she presented with acute worsening of her anemia. She required transfusion of 3 units of PRBC's. She then received IV iron  in the form of Venofer  for 5 doses. She is taking oral iron  now.  She was referred to Dr. Venice Gillis for consideration of evaluation of the iron  deficiency.  Her hemoglobin has slightly improved, which may be due to hemoconcentration.  Repeat iron  studies are pending.

## 2024-01-21 ENCOUNTER — Other Ambulatory Visit: Payer: Self-pay | Admitting: Oncology

## 2024-01-21 DIAGNOSIS — F411 Generalized anxiety disorder: Secondary | ICD-10-CM

## 2024-01-21 DIAGNOSIS — G4700 Insomnia, unspecified: Secondary | ICD-10-CM

## 2024-01-21 MED ORDER — ATORVASTATIN CALCIUM 20 MG PO TABS: 20.0000 mg | ORAL_TABLET | Freq: Every day | ORAL | 3 refills | Status: DC

## 2024-01-22 ENCOUNTER — Ambulatory Visit (INDEPENDENT_AMBULATORY_CARE_PROVIDER_SITE_OTHER): Payer: Self-pay | Admitting: Podiatry

## 2024-01-22 DIAGNOSIS — L603 Nail dystrophy: Secondary | ICD-10-CM

## 2024-01-22 DIAGNOSIS — M79675 Pain in left toe(s): Secondary | ICD-10-CM

## 2024-01-22 DIAGNOSIS — M79674 Pain in right toe(s): Secondary | ICD-10-CM | POA: Diagnosis not present

## 2024-01-22 DIAGNOSIS — B351 Tinea unguium: Secondary | ICD-10-CM

## 2024-01-22 NOTE — Progress Notes (Signed)
 Subjective:  Patient ID: Anna [name], female    DOB: 1970/10/18,  MRN: [id number]  Anna [name] presents to clinic today for concern of possible fungal nails.  She is undergoing chemotherapy at this time.  She states that pretty soon after beginning chemotherapy, the nails had gotten very yellow and thick.  She also notes they are very brittle.  The left 1st and 5th toenails are most involved.  She is wearing compressive wraps on both legs from the forefoot to the knees due to lymphedema and blistering on the legs.  States this was caused by the chemotherapy treatments.  Her mother is with her today.  PCP is Georgean Kindle, MD.  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/2023   Bipolar disorder (HCC) 11/29/2020   Breast cancer (HCC)    Cancer (HCC)    Cardiac murmur    Chest pain    Chronic fatigue syndrome 11/29/2020   Depression    Essential hypertension 11/29/2020   Hepatic steatosis determined by biopsy of liver 07/04/2020   HLD (hyperlipidemia)    Hypertension    Hypomagnesemia 03/26/2023   Hypothyroidism 11/29/2020   Insomnia 11/29/2020   Iron  deficiency anemia 06/05/2022   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   Migraine 11/29/2020   Mild intermittent asthma 11/29/2020   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   Mixed hyperlipidemia 11/29/2020   Obesity due to excess calories    Osteopenia after menopause 07/04/2020   Other long term (current) drug therapy 11/29/2020   Other vitamin B12 deficiency anemias 11/29/2020   Personal history of malignant neoplasm of breast 11/29/2020   Pre-diabetes    Renal cyst, acquired, right 07/04/2020   14.5 cm in October 2021   Retrocalcaneal bursitis (back of heel), left  04/22/2018   Seasonal allergic rhinitis 11/29/2020   Tightness of heel cord, left 04/22/2018   Tremor 08/02/2023   Type 2 diabetes mellitus without complications (HCC) 11/29/2020   Vitamin D  deficiency 11/29/2020   Past Surgical History:  Procedure Laterality Date   CESAREAN SECTION     IR BONE TUMOR(S)RF ABLATION  05/29/2022   IR BONE TUMOR(S)RF ABLATION  05/29/2022   IR KYPHO EA ADDL LEVEL THORACIC OR LUMBAR  05/29/2022   IR KYPHO THORACIC WITH BONE BIOPSY  05/29/2022   IR RADIOLOGIST EVAL & MGMT  05/22/2022   IR RADIOLOGIST EVAL & MGMT  06/12/2022   LIVER BIOPSY  01/2019   lumpectomy  2014   Left Breast   PORT-A-CATH REMOVAL     PORTACATH PLACEMENT  08/28/2013   STOMACH SURGERY     Tummy Tuck   TOTAL HIP ARTHROPLASTY Right 07/04/2021   Procedure: TOTAL HIP ARTHROPLASTY;  Surgeon: Claiborne Crew, MD;  Location: [facility];  Service: Orthopedics;  Laterality: Right;  90   WISDOM TOOTH EXTRACTION     Allergies  Allergen Reactions   Reglan  [Metoclopramide ]     Made patient feel really funny when taking this medication    Xgeva  [Denosumab ] Other (See Comments)    Possible ONJ (09/13/23)    Review of Systems: Negative except as noted in the HPI.  Objective:  Vascular Examination: Capillary refill time is 3-5 seconds to toes bilateral. Palpable pedal pulses b/l LE. Digital hair present b/l.  Dermatological Examination: Pedal skin with normal turgor, texture and tone b/l. No open wounds. No interdigital macerations b/l.  The left 1st and 5th toenails are 3mm thick, yellow discolored, dystrophic with subungual debris. There is pain with compression of the nail plates.  The remainder of the nails have milder involvement.  Assessment/Plan: 1. Dermatophytosis of nail   2. Nail dystrophy   3. Pain in left toe(s)   4. Pain in right toe(s)    The mycotic toenails were sharply debrided x 2 with sterile nail nippers and a power debriding burr to decrease bulk/thickness and length.   The nail clippings were sent to [facility] labs for fungal nail culture.  Will contact the patient to the reviewed the results, but I do not recommend treatment until she has finished chemotherapy.  This typically does not improve the nails while patient is undergoing immunosuppressive therapy.  She may use over-the-counter Optinail solution once daily on the toenails to improve the overall appearance of the nails in the meantime.   Joe Murders, DPM, FACFAS [facility]     [address] [place], [place] [address]                Office [phone]  Fax [phone]

## 2024-01-23 ENCOUNTER — Other Ambulatory Visit: Payer: Self-pay | Admitting: Oncology

## 2024-01-24 ENCOUNTER — Inpatient Hospital Stay: Attending: Hematology and Oncology | Admitting: Hematology and Oncology

## 2024-01-24 ENCOUNTER — Other Ambulatory Visit: Payer: Self-pay | Admitting: Pharmacist

## 2024-01-24 ENCOUNTER — Other Ambulatory Visit: Payer: Self-pay

## 2024-01-24 ENCOUNTER — Encounter: Payer: Self-pay | Admitting: Oncology

## 2024-01-24 ENCOUNTER — Inpatient Hospital Stay

## 2024-01-24 ENCOUNTER — Telehealth: Payer: Self-pay | Admitting: Hematology and Oncology

## 2024-01-24 ENCOUNTER — Encounter: Payer: Self-pay | Admitting: Hematology and Oncology

## 2024-01-24 VITALS — BP 99/73 | HR 94 | Temp 98.6°F | Resp 18 | Ht 62.0 in | Wt 126.1 lb

## 2024-01-24 DIAGNOSIS — C7951 Secondary malignant neoplasm of bone: Secondary | ICD-10-CM

## 2024-01-24 DIAGNOSIS — Z5111 Encounter for antineoplastic chemotherapy: Secondary | ICD-10-CM | POA: Insufficient documentation

## 2024-01-24 DIAGNOSIS — Z923 Personal history of irradiation: Secondary | ICD-10-CM | POA: Diagnosis not present

## 2024-01-24 DIAGNOSIS — Z96641 Presence of right artificial hip joint: Secondary | ICD-10-CM | POA: Insufficient documentation

## 2024-01-24 DIAGNOSIS — E860 Dehydration: Secondary | ICD-10-CM | POA: Diagnosis not present

## 2024-01-24 DIAGNOSIS — D509 Iron deficiency anemia, unspecified: Secondary | ICD-10-CM | POA: Diagnosis not present

## 2024-01-24 DIAGNOSIS — Z170 Estrogen receptor positive status [ER+]: Secondary | ICD-10-CM | POA: Insufficient documentation

## 2024-01-24 DIAGNOSIS — R300 Dysuria: Secondary | ICD-10-CM | POA: Insufficient documentation

## 2024-01-24 DIAGNOSIS — C50412 Malignant neoplasm of upper-outer quadrant of left female breast: Secondary | ICD-10-CM | POA: Insufficient documentation

## 2024-01-24 DIAGNOSIS — Z5112 Encounter for antineoplastic immunotherapy: Secondary | ICD-10-CM | POA: Insufficient documentation

## 2024-01-24 DIAGNOSIS — M87180 Osteonecrosis due to drugs, jaw: Secondary | ICD-10-CM | POA: Diagnosis not present

## 2024-01-24 DIAGNOSIS — D539 Nutritional anemia, unspecified: Secondary | ICD-10-CM

## 2024-01-24 DIAGNOSIS — R32 Unspecified urinary incontinence: Secondary | ICD-10-CM

## 2024-01-24 DIAGNOSIS — R600 Localized edema: Secondary | ICD-10-CM | POA: Diagnosis not present

## 2024-01-24 LAB — CBC WITH DIFFERENTIAL (CANCER CENTER ONLY)
Abs Immature Granulocytes: 0.07 10*3/uL (ref 0.00–0.07)
Basophils Absolute: 0 10*3/uL (ref 0.0–0.1)
Basophils Relative: 0 %
Eosinophils Absolute: 0.2 10*3/uL (ref 0.0–0.5)
Eosinophils Relative: 3 %
HCT: 36.9 % (ref 36.0–46.0)
Hemoglobin: 11.4 g/dL — ABNORMAL LOW (ref 12.0–15.0)
Immature Granulocytes: 1 %
Lymphocytes Relative: 10 %
Lymphs Abs: 0.7 10*3/uL (ref 0.7–4.0)
MCH: 26.1 pg (ref 26.0–34.0)
MCHC: 30.9 g/dL (ref 30.0–36.0)
MCV: 84.4 fL (ref 80.0–100.0)
Monocytes Absolute: 0.6 10*3/uL (ref 0.1–1.0)
Monocytes Relative: 8 %
Neutro Abs: 5.4 10*3/uL (ref 1.7–7.7)
Neutrophils Relative %: 78 %
Platelet Count: 321 10*3/uL (ref 150–400)
RBC: 4.37 MIL/uL (ref 3.87–5.11)
RDW: 17.1 % — ABNORMAL HIGH (ref 11.5–15.5)
WBC Count: 7 10*3/uL (ref 4.0–10.5)
nRBC: 0 % (ref 0.0–0.2)

## 2024-01-24 LAB — CMP (CANCER CENTER ONLY)
ALT: 10 U/L (ref 0–44)
AST: 14 U/L — ABNORMAL LOW (ref 15–41)
Albumin: 3.9 g/dL (ref 3.5–5.0)
Alkaline Phosphatase: 95 U/L (ref 38–126)
Anion gap: 11 (ref 5–15)
BUN: 24 mg/dL — ABNORMAL HIGH (ref 6–20)
CO2: 26 mmol/L (ref 22–32)
Calcium: 9.7 mg/dL (ref 8.9–10.3)
Chloride: 104 mmol/L (ref 98–111)
Creatinine: 0.56 mg/dL (ref 0.44–1.00)
GFR, Estimated: >60 mL/min (ref >60)
Glucose, Bld: 135 mg/dL — ABNORMAL HIGH (ref 70–99)
Potassium: 4.1 mmol/L (ref 3.5–5.1)
Sodium: 140 mmol/L (ref 135–145)
Total Bilirubin: <0.2 mg/dL (ref 0.0–1.2)
Total Protein: 6.1 g/dL — ABNORMAL LOW (ref 6.5–8.1)

## 2024-01-24 LAB — IRON AND TIBC
Iron: 35 ug/dL (ref 28–170)
Saturation Ratios: 13 % (ref 10.4–31.8)
TIBC: 269 ug/dL (ref 250–450)
UIBC: 234 ug/dL

## 2024-01-24 LAB — FERRITIN: Ferritin: 326 ng/mL — ABNORMAL HIGH (ref 11–307)

## 2024-01-24 MED ORDER — DEXAMETHASONE SODIUM PHOSPHATE 10 MG/ML IJ SOLN
10.0000 mg | Freq: Once | INTRAMUSCULAR | Status: AC
  Administered 2024-01-24: 10 mg via INTRAVENOUS
  Filled 2024-01-24: qty 1

## 2024-01-24 MED ORDER — DEXTROSE 5 % IV SOLN: Freq: Once | INTRAVENOUS | Status: AC

## 2024-01-24 MED ORDER — DIPHENHYDRAMINE HCL 25 MG PO CAPS
50.0000 mg | ORAL_CAPSULE | Freq: Once | ORAL | Status: DC
  Filled 2024-01-24: qty 2

## 2024-01-24 MED ORDER — HEPARIN SOD (PORK) LOCK FLUSH 100 UNIT/ML IV SOLN
500.0000 [IU] | Freq: Once | INTRAVENOUS | Status: AC | PRN
  Administered 2024-01-24: 500 [IU]

## 2024-01-24 MED ORDER — SODIUM CHLORIDE 0.9% FLUSH
10.0000 mL | INTRAVENOUS | Status: DC | PRN
  Administered 2024-01-24: 10 mL

## 2024-01-24 MED ORDER — ACETAMINOPHEN 325 MG PO TABS
650.0000 mg | ORAL_TABLET | Freq: Once | ORAL | Status: DC
  Filled 2024-01-24: qty 2

## 2024-01-24 MED ORDER — FAM-TRASTUZUMAB DERUXTECAN-NXKI CHEMO 100 MG IV SOLR
5.3000 mg/kg | Freq: Once | INTRAVENOUS | Status: AC
  Administered 2024-01-24: 300 mg via INTRAVENOUS
  Filled 2024-01-24: qty 15

## 2024-01-24 MED ORDER — PALONOSETRON HCL INJECTION 0.25 MG/5ML
0.2500 mg | Freq: Once | INTRAVENOUS | Status: AC
  Administered 2024-01-24: 0.25 mg via INTRAVENOUS
  Filled 2024-01-24: qty 5

## 2024-01-24 MED ORDER — FULVESTRANT 250 MG/5ML IM SOSY
500.0000 mg | PREFILLED_SYRINGE | Freq: Once | INTRAMUSCULAR | Status: AC
  Administered 2024-01-24: 500 mg via INTRAMUSCULAR
  Filled 2024-01-24: qty 10

## 2024-01-24 MED ORDER — SODIUM CHLORIDE 0.9 % IV SOLN
150.0000 mg | Freq: Once | INTRAVENOUS | Status: AC
  Administered 2024-01-24: 150 mg via INTRAVENOUS
  Filled 2024-01-24: qty 150

## 2024-01-24 MED ORDER — SODIUM CHLORIDE 0.9 % IV SOLN: Freq: Once | INTRAVENOUS | Status: AC

## 2024-01-24 NOTE — Patient Instructions (Signed)
 Fam-Trastuzumab Deruxtecan Injection What is this medication? FAM-TRASTUZUMAB DERUXTECAN (fam-tras TOOZ eu mab DER ux TEE kan) treats some types of cancer. It works by blocking a protein that causes cancer cells to grow and multiply. This helps to slow or stop the spread of cancer cells. This medicine may be used for other purposes; ask your health care provider or pharmacist if you have questions. COMMON BRAND NAME(S): ENHERTU What should I tell my care team before I take this medication? They need to know if you have any of these conditions: Heart disease Heart failure Infection, especially a viral infection, such as chickenpox, cold sores, or herpes Liver disease Lung or breathing disease, such as asthma or COPD An unusual or allergic reaction to fam-trastuzumab deruxtecan, other medications, foods, dyes, or preservatives Pregnant or trying to get pregnant Breast-feeding How should I use this medication? This medication is injected into a vein. It is given by your care team in a hospital or clinic setting. A special MedGuide will be given to you before each treatment. Be sure to read this information carefully each time. Talk to your care team about the use of this medication in children. Special care may be needed. Overdosage: If you think you have taken too much of this medicine contact a poison control center or emergency room at once. NOTE: This medicine is only for you. Do not share this medicine with others. What if I miss a dose? It is important not to miss your dose. Call your care team if you are unable to keep an appointment. What may interact with this medication? Interactions are not expected. This list may not describe all possible interactions. Give your health care provider a list of all the medicines, herbs, non-prescription drugs, or dietary supplements you use. Also tell them if you smoke, drink alcohol, or use illegal drugs. Some items may interact with your  medicine. What should I watch for while using this medication? Visit your care team for regular checks on your progress. Tell your care team if your symptoms do not start to get better or if they get worse. This medication may increase your risk of getting an infection. Call your care team for advice if you get a fever, chills, sore throat, or other symptoms of a cold or flu. Do not treat yourself. Try to avoid being around people who are sick. Avoid taking medications that contain aspirin, acetaminophen, ibuprofen, naproxen, or ketoprofen unless instructed by your care team. These medications may hide a fever. Be careful brushing or flossing your teeth or using a toothpick because you may get an infection or bleed more easily. If you have any dental work done, tell your dentist you are receiving this medication. This medication may cause dry eyes and blurred vision. If you wear contact lenses, you may feel some discomfort. Lubricating eye drops may help. See your care team if the problem does not go away or is severe. Talk to your care team if you may be pregnant. Serious birth defects can occur if you take this medication during pregnancy and for 7 months after the last dose. If your partner can get pregnant, use a condom during sex while taking this medication and for 4 months after the last dose. Do not breastfeed while taking this medication and for 7 months after the last dose. This medication may cause infertility. Talk to your care team if you are concerned about your fertility. What side effects may I notice from receiving this medication? Side effects  that you should report to your care team as soon as possible: Allergic reactions--skin rash, itching, hives, swelling of the face, lips, tongue, or throat Dry cough, shortness of breath or trouble breathing Infection--fever, chills, cough, sore throat, wounds that don't heal, pain or trouble when passing urine, general feeling of discomfort or  being unwell Heart failure--shortness of breath, swelling of the ankles, feet, or hands, sudden weight gain, unusual weakness or fatigue Unusual bruising or bleeding Side effects that usually do not require medical attention (report these to your care team if they continue or are bothersome): Constipation Diarrhea Hair loss Muscle pain Nausea Vomiting This list may not describe all possible side effects. Call your doctor for medical advice about side effects. You may report side effects to FDA at 1-800-FDA-1088. Where should I keep my medication? This medication is given in a hospital or clinic. It will not be stored at home. NOTE: This sheet is a summary. It may not cover all possible information. If you have questions about this medicine, talk to your doctor, pharmacist, or health care provider.  2024 [employer] (2023-03-29 00:00:00)

## 2024-01-24 NOTE — Telephone Encounter (Signed)
 Patient has been scheduled for follow-up visit per 01/24/24 LOS.  Pt given an appt calendar with date and time.

## 2024-01-24 NOTE — Assessment & Plan Note (Signed)
 She is clinically dehydrated today.  I will give her additional IV fluids.  She noticed push clear fluids at home.

## 2024-01-27 ENCOUNTER — Encounter: Payer: Self-pay | Admitting: Gastroenterology

## 2024-01-27 ENCOUNTER — Ambulatory Visit: Admitting: Gastroenterology

## 2024-01-27 VITALS — BP 120/82 | HR 72 | Ht <= 58 in | Wt 129.0 lb

## 2024-01-27 DIAGNOSIS — R112 Nausea with vomiting, unspecified: Secondary | ICD-10-CM

## 2024-01-27 DIAGNOSIS — C50919 Malignant neoplasm of unspecified site of unspecified female breast: Secondary | ICD-10-CM | POA: Diagnosis not present

## 2024-01-27 DIAGNOSIS — C7951 Secondary malignant neoplasm of bone: Secondary | ICD-10-CM | POA: Diagnosis not present

## 2024-01-27 DIAGNOSIS — D509 Iron deficiency anemia, unspecified: Secondary | ICD-10-CM | POA: Diagnosis not present

## 2024-01-27 NOTE — Progress Notes (Signed)
 Chief Complaint:   Referring Provider:  Nolia Baumgartner, MD      ASSESSMENT AND PLAN;   #1. N/V likely d/t chemo. H/O 1 episode of melena  #2. IDA. Neg CT AP Jan 2025  #3. Stage IV Breast Ca with bone mets  Plan: -Continue omeprazole  40mg  po every day -EGD -She wants to hold off on colon. I agree. -Continue compazine /zofran      HPI:    Anna [name] is a 53 y.o. female  History of Present Illness With Stage IV Breast cancer with extensive bone metastasis on THP/XRT who presents with gastrointestinal symptoms. She was referred by Dr. Almer Jacobson for evaluation of IDA. Enhertu  was stopped due to GI toxicity.  Hb 6.4 (10/2023) s/p 3U to Hb 11.4  IDA thought to be d/t extensive marrow replacing mets or recent Ortho surgery However, she had 1 episode of melanotic appearing stools which has resolved.  Has been having occasional nausea/vomiting/heartburn With omeprazole /Compazine /Zofran   Had negative CT Abdo/pelvis for any metastatic lesions  She also had negative colonoscopy except for a 10 mm colonic polyp 02/2021  Denies use of nonsteroidals.  Has more constipation than diarrhea.  No rectal bleeding.  At this time she denies having any odynophagia or dysphagia.  Does not want repeat colonoscopy at this time.    Past GI workup: Colon 02/2021 - One 10 mm polyp at the recto- sigmoid colon, removed with a hot snare. Resected and retrieved. - Mild sigmoid diverticulosis. - Non- bleeding internal hemorrhoids. - The examination was otherwise normal - Bx- TV adenoma - Recommended to repeat in 3 years.  She would like to hold off currently  CT Abdo/pelvis with contrast Jan 2025-negative for any intra-abdominal mets. 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/2023    Bipolar disorder (HCC) 11/29/2020   Breast cancer (HCC)    Cancer (HCC)    Cardiac murmur    Chest pain    Chronic fatigue syndrome 11/29/2020   Depression    Essential hypertension 11/29/2020   Hepatic steatosis determined by biopsy of liver 07/04/2020   HLD (hyperlipidemia)    Hypertension    Hypomagnesemia 03/26/2023   Hypothyroidism 11/29/2020   Insomnia 11/29/2020   Iron  deficiency anemia 06/05/2022   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   Migraine 11/29/2020   Mild intermittent asthma 11/29/2020   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   Mixed hyperlipidemia 11/29/2020   Obesity due to excess calories    Osteopenia after menopause 07/04/2020   Other long term (current) drug therapy 11/29/2020   Other vitamin B12 deficiency anemias 11/29/2020   Personal history of malignant neoplasm of breast 11/29/2020   Pre-diabetes    Renal cyst, acquired, right 07/04/2020   14.5 cm in October 2021   Retrocalcaneal bursitis (back of heel), left 04/22/2018   Seasonal allergic rhinitis 11/29/2020   Tightness of heel cord, left 04/22/2018   Tremor 08/02/2023   Type 2 diabetes mellitus without complications (HCC) 11/29/2020   Vitamin D  deficiency 11/29/2020    Past Surgical History:  Procedure Laterality Date   CESAREAN SECTION     IR BONE TUMOR(S)RF ABLATION  05/29/2022   IR BONE TUMOR(S)RF ABLATION  05/29/2022   IR KYPHO EA ADDL LEVEL THORACIC OR LUMBAR  05/29/2022   IR KYPHO  THORACIC WITH BONE BIOPSY  05/29/2022   IR RADIOLOGIST EVAL & MGMT  05/22/2022   IR RADIOLOGIST EVAL & MGMT  06/12/2022   LIVER BIOPSY  01/2019   lumpectomy  2014   Left Breast   PORT-A-CATH REMOVAL     PORTACATH PLACEMENT  08/28/2013   STOMACH SURGERY     Tummy Tuck   TOTAL HIP ARTHROPLASTY Right 07/04/2021   Procedure: TOTAL HIP ARTHROPLASTY;  Surgeon: Claiborne Crew, MD;  Location: [facility];  Service: Orthopedics;  Laterality: Right;  90   WISDOM  TOOTH EXTRACTION      Family History  Problem Relation Age of Onset   Hyperlipidemia Mother    Diabetes Father    Stroke Father    Colon cancer Neg Hx    Colon polyps Neg Hx    Esophageal cancer Neg Hx    Rectal cancer Neg Hx    Stomach cancer Neg Hx     Social History   Tobacco Use   Smoking status: Former    Current packs/day: 0.00    Types: Cigarettes    Quit date: 2004    Years since quitting: 21.4   Smokeless tobacco: Never  Vaping Use   Vaping status: Never Used  Substance Use Topics   Alcohol use: Yes    Comment: occasionally   Drug use: Never    Current Outpatient Medications  Medication Sig Dispense Refill   albuterol  (VENTOLIN  HFA) 108 (90 Base) MCG/ACT inhaler Inhale 2 puffs into the lungs every 6 (six) hours as needed for shortness of breath.     amoxicillin  (AMOXIL ) 500 MG capsule TAKE ONE CAPSULE BY MOUTH FOUR TIMES A DAY 40 capsule 0   ARIPiprazole  (ABILIFY ) 5 MG tablet Take 2.5 mg by mouth daily.     aspirin 81 MG EC tablet Take 81 mg by mouth daily. Swallow whole.     atorvastatin  (LIPITOR) 20 MG tablet Take 1 tablet (20 mg total) by mouth daily. 90 tablet 3   augmented betamethasone dipropionate (DIPROLENE-AF) 0.05 % cream  (Patient taking differently: 1 Application as needed.)     dapagliflozin  propanediol (FARXIGA ) 10 MG TABS tablet TAKE ONE TABLET BY MOUTH EVERY DAY 30 tablet 10   desvenlafaxine  (PRISTIQ ) 50 MG 24 hr tablet Take 50 mg by mouth daily.     dexamethasone  (DECADRON ) 4 MG tablet TAKE ONE TABLET BY MOUTH 2 TIMES A DAY FOR 5 DAYS AFTER CHEMOTHERAPY 20 tablet 2   folic acid  (FOLVITE ) 1 MG tablet Take 1 tablet (1 mg total) by mouth daily. 30 tablet 5   labetalol  (NORMODYNE ) 100 MG tablet Take 1 tablet (100 mg total) by mouth 2 (two) times daily. 180 tablet 3   loratadine  (CLARITIN ) 10 MG tablet TAKE ONE TABLET BY MOUTH EVERY DAY 30 tablet 5   LORazepam  (ATIVAN ) 1 MG tablet TAKE ONE TABLET BY MOUTH EVERY 8 HOURS AS NEEDED FOR ANXIETY   NAUSEA   OR VOMITING 30 tablet 0   magic mouthwash (lidocaine , diphenhydrAMINE , alum & mag hydroxide) suspension Take 5 mLs by mouth every 3 (three) hours as needed. Swish and spit; for throat/mouth pain     nitroGLYCERIN  (NITROSTAT ) 0.4 MG SL tablet Place 0.4 mg under the tongue every 5 (five) minutes as needed for chest pain.     nystatin  (MYCOSTATIN /NYSTOP ) powder Apply 1 Application topically 2 (two) times daily.     omeprazole  (PRILOSEC) 40 MG capsule Take 1 capsule (40 mg total) by mouth at bedtime. 30 capsule 5   ondansetron  (  ZOFRAN ) 8 MG tablet Take 1 tablet (8 mg total) by mouth every 8 (eight) hours as needed for nausea or vomiting. 60 tablet 5   Oxycodone  HCl 10 MG TABS TAKE ONE TABLET BY MOUTH EVERY 4 HOURS AS NEEDED FOR PAIN 180 tablet 0   Potassium 99 MG TABS Take 2 tablets by mouth 2 (two) times daily. GUMMIES     prochlorperazine  (COMPAZINE ) 10 MG tablet TAKE ONE TABLET BY MOUTH EVERY 6 HOURS AS NEEDED FOR NAUSEA/VOMITING 90 tablet 3   Vibegron (GEMTESA) 75 MG TABS Take 1 tablet by mouth daily.     diphenoxylate -atropine  (LOMOTIL ) 2.5-0.025 MG tablet TAKE TWO TABLETS BY MOUTH FOUR TIMES A DAY AS NEEDED FOR DIARRHEA OR LOOSE STOOLS (Patient not taking: Reported on 01/27/2024) 100 tablet 1   naloxone  (NARCAN ) nasal spray 4 mg/0.1 mL One spray in nostril as needed, may repeat every 2 to 3 minutes until medical assistance becomes available (Patient not taking: Reported on 01/27/2024) 4 each 1   Current Facility-Administered Medications  Medication Dose Route Frequency Provider Last Rate Last Admin   technetium sestamibi generic (CARDIOLITE ) injection 10.5 millicurie  10.5 millicurie Intravenous Once PRN Revankar, Rajan R, MD       Facility-Administered Medications Ordered in Other Visits  Medication Dose Route Frequency Provider Last Rate Last Admin   heparin  lock flush 100 unit/mL  500 Units Intracatheter Once PRN Baldomero Bone A, NP       sodium chloride  flush (NS) 0.9 % injection 10 mL  10  mL Intracatheter PRN Mosher, Kelli A, PA-C   10 mL at 11/15/23 1111   sodium chloride  flush (NS) 0.9 % injection 10 mL  10 mL Intracatheter PRN Vera Gip A, PA-C       sodium chloride  flush (NS) 0.9 % injection 10 mL  10 mL Intravenous PRN Nolia Baumgartner, MD   10 mL at 12/06/23 1109   sodium chloride  flush 0.9 % injection 10 mL  10 mL Intravenous PRN Nolia Baumgartner, MD   10 mL at 11/05/23 1610    Allergies  Allergen Reactions   Reglan  [Metoclopramide ]     Made patient feel really funny when taking this medication    Xgeva  [Denosumab ] Other (See Comments)    Possible ONJ (09/13/23)    Review of Systems:  neg     Physical Exam:    BP 120/82 (BP Location: Right Arm, Patient Position: Sitting, Cuff Size: Normal)   Pulse 72   Ht 4' 8.5 (1.435 m) Comment: height measured without shoes  Wt 129 lb (58.5 kg)   LMP 08/18/2013   BMI 28.41 kg/m  Wt Readings from Last 3 Encounters:  01/27/24 129 lb (58.5 kg)  01/24/24 126 lb 1.6 oz (57.2 kg)  12/25/23 127 lb 9.6 oz (57.9 kg)   Constitutional:  Well-developed, in no acute distress. Psychiatric: Normal mood and affect. Behavior is normal. HEENT: Pupils normal.  Conjunctivae are normal. No scleral icterus. Cardiovascular: Normal rate, regular rhythm. No edema Pulmonary/chest: Effort normal and breath sounds normal. No wheezing, rales or rhonchi. Abdominal: Soft, nondistended. Nontender. Bowel sounds active throughout. There are no masses palpable. No hepatomegaly. Rectal: Deferred Neurological: Alert and oriented to person place and time. Skin: Skin is warm and dry. No rashes noted.  Data Reviewed: I have personally reviewed following labs and imaging studies  CBC:    Latest Ref Rng & Units 01/24/2024    9:39 AM 12/25/2023    8:37 AM 12/06/2023    8:45 AM  CBC  WBC 4.0 - 10.5 K/uL 7.0  7.4  6.6   Hemoglobin 12.0 - 15.0 g/dL 16.1  09.6  04.5   Hematocrit 36.0 - 46.0 % 36.9  35.2  38.2   Platelets 150 - 400 K/uL 321   270  288     CMP:    Latest Ref Rng & Units 01/24/2024    9:39 AM 12/25/2023    8:37 AM 12/06/2023    8:45 AM  CMP  Glucose 70 - 99 mg/dL 409  811  914   BUN 6 - 20 mg/dL 24  12  26    Creatinine 0.44 - 1.00 mg/dL 7.82  9.56  2.13   Sodium 135 - 145 mmol/L 140  140  138   Potassium 3.5 - 5.1 mmol/L 4.1  3.5  3.9   Chloride 98 - 111 mmol/L 104  102  101   CO2 22 - 32 mmol/L 26  27  29    Calcium  8.9 - 10.3 mg/dL 9.7  9.1  9.2   Total Protein 6.5 - 8.1 g/dL 6.1  5.8  6.1   Total Bilirubin 0.0 - 1.2 mg/dL <0.8  <6.5  0.2   Alkaline Phos 38 - 126 U/L 95  93  91   AST 15 - 41 U/L 14  13  19    ALT 0 - 44 U/L 10  11  19      GFR: Estimated Creatinine Clearance: 59.6 mL/min (by C-G formula based on SCr of 0.56 mg/dL). Liver Function Tests: Recent Labs  Lab 01/24/24 0939  AST 14*  ALT 10  ALKPHOS 95  BILITOT <0.2  PROT 6.1*  ALBUMIN 3.9      Magnus Schuller, MD 01/27/2024, 3:34 PM  Cc: Nolia Baumgartner, MD

## 2024-01-27 NOTE — Patient Instructions (Addendum)
 _______________________________________________________  If your blood pressure at your visit was 140/90 or greater, please contact your primary care physician to follow up on this.  _______________________________________________________  If you are age 53 or older, your body mass index should be between 23-30. Your Body mass index is 28.41 kg/m. If this is out of the aforementioned range listed, please consider follow up with your Primary Care Provider.  If you are age 54 or younger, your body mass index should be between 19-25. Your Body mass index is 28.41 kg/m. If this is out of the aformentioned range listed, please consider follow up with your Primary Care Provider.   ________________________________________________________  The [facility] providers would like to encourage you to use MYCHART to communicate with providers for non-urgent requests or questions.  Due to long hold times on the telephone, sending your provider a message by [facility] may be a faster and more efficient way to get a response.  Please allow 48 business hours for a response.  Please remember that this is for non-urgent requests.  _______________________________________________________  Continue omeprazole  40mg  daily  You have been scheduled for an endoscopy. Please follow written instructions given to you at your visit today.  If you use inhalers (even only as needed), please bring them with you on the day of your procedure.  If you take any of the following medications, they will need to be adjusted prior to your procedure:   DO NOT TAKE 7 DAYS PRIOR TO TEST- Trulicity (dulaglutide) Ozempic, Wegovy (semaglutide) Mounjaro (tirzepatide) Bydureon Bcise (exanatide extended release)  DO NOT TAKE 1 DAY PRIOR TO YOUR TEST Rybelsus (semaglutide) Adlyxin (lixisenatide) Victoza (liraglutide) Byetta (exanatide) ___________________________________________________________________________  Thank you,  Dr. Lajuan Pila

## 2024-01-29 ENCOUNTER — Other Ambulatory Visit: Payer: Self-pay | Admitting: Oncology

## 2024-01-29 ENCOUNTER — Inpatient Hospital Stay

## 2024-01-29 ENCOUNTER — Encounter: Payer: Self-pay | Admitting: Oncology

## 2024-01-29 ENCOUNTER — Ambulatory Visit (HOSPITAL_BASED_OUTPATIENT_CLINIC_OR_DEPARTMENT_OTHER)
Admission: RE | Admit: 2024-01-29 | Discharge: 2024-01-29 | Disposition: A | Discharge status: Home / Self Care | Source: Ambulatory Visit | Attending: Hematology and Oncology | Admitting: Hematology and Oncology

## 2024-01-29 DIAGNOSIS — C50412 Malignant neoplasm of upper-outer quadrant of left female breast: Secondary | ICD-10-CM

## 2024-01-29 DIAGNOSIS — G4700 Insomnia, unspecified: Secondary | ICD-10-CM

## 2024-01-29 DIAGNOSIS — C7951 Secondary malignant neoplasm of bone: Secondary | ICD-10-CM

## 2024-01-29 DIAGNOSIS — Z170 Estrogen receptor positive status [ER+]: Secondary | ICD-10-CM | POA: Diagnosis not present

## 2024-01-29 DIAGNOSIS — C801 Malignant (primary) neoplasm, unspecified: Secondary | ICD-10-CM

## 2024-01-29 DIAGNOSIS — K521 Toxic gastroenteritis and colitis: Secondary | ICD-10-CM

## 2024-01-29 MED ORDER — HEPARIN SOD (PORK) LOCK FLUSH 100 UNIT/ML IV SOLN: 250.0000 [IU] | INTRAVENOUS | Status: DC | PRN

## 2024-01-29 MED ORDER — IOHEXOL 300 MG/ML  SOLN
100.0000 mL | Freq: Once | INTRAMUSCULAR | Status: AC | PRN
  Administered 2024-01-29: 100 mL via INTRAVENOUS

## 2024-01-29 MED ORDER — LORAZEPAM 1 MG PO TABS: 1.0000 mg | ORAL_TABLET | Freq: Four times a day (QID) | ORAL | 0 refills | Status: DC | PRN

## 2024-01-29 MED ORDER — HEPARIN SOD (PORK) LOCK FLUSH 100 UNIT/ML IV SOLN
500.0000 [IU] | Freq: Once | INTRAVENOUS | Status: AC
  Administered 2024-01-29: 500 [IU] via INTRAVENOUS

## 2024-01-31 ENCOUNTER — Telehealth: Payer: Self-pay

## 2024-01-31 NOTE — Telephone Encounter (Signed)
 Refilled called into [facility]

## 2024-01-31 NOTE — Telephone Encounter (Signed)
-----   Message from Nolia Baumgartner sent at 01/31/2024  1:19 PM EDT ----- Regarding: refill? I have a message 'refill error on the [facility]W.  Would you call it in?

## 2024-02-07 ENCOUNTER — Encounter: Payer: Self-pay | Admitting: Oncology

## 2024-02-07 ENCOUNTER — Other Ambulatory Visit: Payer: Self-pay | Admitting: Podiatry

## 2024-02-11 ENCOUNTER — Telehealth: Payer: Self-pay

## 2024-02-11 ENCOUNTER — Other Ambulatory Visit: Payer: Self-pay | Admitting: Hematology and Oncology

## 2024-02-11 DIAGNOSIS — M545 Low back pain, unspecified: Secondary | ICD-10-CM

## 2024-02-11 DIAGNOSIS — M272 Inflammatory conditions of jaws: Secondary | ICD-10-CM

## 2024-02-11 DIAGNOSIS — C50919 Malignant neoplasm of unspecified site of unspecified female breast: Secondary | ICD-10-CM

## 2024-02-11 DIAGNOSIS — R0789 Other chest pain: Secondary | ICD-10-CM

## 2024-02-11 DIAGNOSIS — G893 Neoplasm related pain (acute) (chronic): Secondary | ICD-10-CM

## 2024-02-11 NOTE — Telephone Encounter (Addendum)
 Pt states the knot on her left jaw/chin is back. It's tender, feels warm, but not really red. Afebrile.  Kelli,PA: Hey, I see there is a refill request for amoxil  I've been giving her for tooth infection and she has just had it a month. Would you check on that please? Has she seen the dentist? I wonder what he would recommend we do. Thanks

## 2024-02-12 ENCOUNTER — Ambulatory Visit: Payer: Self-pay | Admitting: Podiatry

## 2024-02-19 NOTE — Progress Notes (Signed)
 [facility]  [address] [place],  [place]  [address] [phone] Clinic Day: 02/21/24  Referring physician: Pia Kerney SQUIBB, MD  [name] & PLAN:  [name]: Malignant neoplasm metastatic to bone [facility]) Bone metastases diagnosed in November 2022, with multiple marrow replacing lesions within the proximal right femur within the ischium as well as the inferior rami. 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 Ki67 was 60%. PET imaging in December revealed dominant finding of intensely hypermetabolic aggressive skeletal metastasis involving the calvarium, sternum, thoracic and lumbar spine, and pelvis, with potential pathologic fracture of the right femur with internal fixation. There was soft tissue extension into the anterior mediastinum associated with the manubrial metastasis. Multiple spinal vertebral body lesions had cortical destruction along the posterior margin. She started monthly Xgeva  in December, and completed radiation therapy. She was receiving THP, but cycle 4 was delayed and dose reduction made by 20% due to left leg skin infection.    She continued with Herceptin /Perjeta  until she had progression of disease. In January 2024, she developed increased pain of the left hip. She was found to have innumerable metastatic lesions throughout the pelvis and proximal femur. There is a non displaced pathologic fracture through a large lesion in the superior left acetabulum. She was referred to an orthopedic oncologist at [facility] and they recommended radiation and no surgery. She received her palliative radiation. She followed-up with [facility] in April and they do not recommend surgery, but will continue surveillance.    We then switched her to Enhertu  chemotherapy in March of 2024, but she has had significant toxicities of nausea, vomiting, diarrhea, and hypokalemia.  She was also placed on  hormonal therapy with Faslodex  injections.  Repeat CT imaging in July revealed decreased soft tissue thickening around the left subclavian structures and thoracic inlet.  Left lateral breast mass was stable.  Widespread mixed lytic and sclerotic osseous metastatic lesions were stable.  There was unchanged severe elevation of the left hemidiaphragm. CT neck in October to evaluate for possible soft tissue mass and painful swallowing did not reveal any soft tissue masses.  The extensive mixed lytic and sclerotic skeletal metastases were much more pronounced in the visible upper thorax than in the cervical spine and were felt to be grossly stable from July 2024. CT chest, abdomen and pelvis in November revealed stable bony disease without evidence of new/progressive disease.  Due to the development of osteonecrosis of the jaw, denosumab  was discontinued 2025   She completed 18 cycles of Enhertu , which was then placed on hold due to severe anemia.  She required multiple units of blood. She was found to be iron  deficient and received IV iron  in the form of Venofer  for 5 doses.  We resumed Enhertu  in April.  She has continued fulvestrant  monthly.  In May, patient requested she receive Enhertu  every 4 weeks.   She was referred to Dr. Charlanne to consider evaluation of the iron  deficiency.  He saw her in June, 2025 and plans EGD and recommended she continue the omeprazole  at 40 mg daily but will hold off on a colonoscopy.   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  She was treated with lumpectomy followed by adjuvant chemotherapy and radiation therapy. She was on adjuvant hormonal therapy with tamoxifen 20 mg daily from October  2015 to May 2020, but due to elevation of the liver transaminases was switched to anastrazole.  She then developed bone metastasis in November 2022 so has been receiving  chemotherapy. She had skin thickening on her last mammogram, but a nodule in the left breast was stable on CT imaging. The skin thickening is felt to possibly due to radiation, as her exam remained stable. Continued mammograms are not recommended.   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 She developed pain of the right jaw in February 2025.  Panorex x-rays revealed an abnormal area in the right lower jaw consistent with osteonecrosis.  Xgeva  was discontinued.  She was treated with Augmentin  in February.  She has been working closely with her dentist. She has had 2 fillings done, but also may need root canals.  She sees him again on April 8.  She had swelling and erythema of the left jaw earlier this month, so was placed on amoxicillin  4 times daily, with resolution of the swelling and erythema.  The redness and swelling have resolved, but she states she still has some mild tenderness of the anterior left jaw.  She has had recurrent swelling and erythema and requires intermittent courses of Amoxicillin .   Iron  deficiency anemia New iron  deficiency anemia diagnosed in March, when she presented with acute worsening of her anemia. She required transfusion of 3 units of PRBC's. She then received IV iron  in the form of Venofer  for 5 doses. She is taking oral iron  now.   Her hemoglobin has slightly improved, which may be due to hemoconcentration.  She was referred to Dr. Charlanne to consider evaluation of the iron  deficiency.  He saw her in June, 2025 and plans EGD and recommended she continue the omeprazole  at 40 mg daily but will hold off on a colonoscopy.  Plan: She continues Enhertu  and Faslodex  injections every 4 weeks. Her usual chronic pain has been controlled with oxycodone  10 mg Q4hrs prn but she complains of constipation, and dysuria. I will order a urine culture and urinalysis. The blisters on her right leg are now developing on the left, she informed me that [facility]. She did meet with Dr. Charlanne in June who decided not to do a colonoscopy, but did schedule a endoscopy and advised her to continue her omeprazole  40 mg daily. Her day 1 cycle 22 of Enhertu  is scheduled on 02/21/2024. She has a WBC of 6.2, low hemoglobin of 11.0 down from 11.4, and platelet count of 290,0000. Her CMP is normal other than an elevated BUN of 25 and low total protein of 5.9. Her BP was also low today at 95/73 and considering her BUN level I will add 1L of normal saline. I recommended she increase protein in her diet. I will see her back in 4 weeks with CBC and CMP. The patient understands the plans discussed today and is in agreement with them.  She knows to contact our office if she develops concerns prior to her next appointment.   I provided 30 minutes of face-to-face time during this encounter and > 50% was spent counseling as documented under my [name] and plan.   Wanda VEAR Cornish, MD  [facility] A DEPT OF [facility] [address] [place] [address] Dept: [phone] Dept Fax: [phone]   No orders of the defined types were placed in  this encounter.   CHIEF COMPLAINT:  CC: Metastatic hormone and HER2 receptor positive breast cancer  Current Treatment: Enhertu  every 4 weeks  HISTORY OF PRESENT ILLNESS:   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 (HCC)   07/23/2013 Cancer Staging   Staging form: Breast, AJCC 7th Edition - Clinical stage from 07/23/2013: Stage IIB (T2, N1, M0) - Signed by Anna Wanda DEL, MD on 07/04/2020 Prognostic indicators: Pos LVI   09/22/2021 - 04/06/2022 Chemotherapy   Patient is on Treatment Plan : BREAST  Trastuzumab  + Pertuzumab  q21d      09/22/2021 - 09/21/2022 Chemotherapy    Patient is on Treatment Plan : BREAST Trastuzumab  + Pertuzumab  q21d     10/12/2022 -  Chemotherapy   Patient is on Treatment Plan : BREAST METASTATIC Fam-Trastuzumab Deruxtecan-nxki  (Enhertu ) (5.4) q28d (changed from q21d on 12/25/23)     Malignant neoplasm metastatic to bone (HCC)  06/28/2021 Initial Diagnosis   Bone metastases (HCC)   09/22/2021 - 04/06/2022 Chemotherapy   Patient is on Treatment Plan : BREAST  Trastuzumab  + Pertuzumab  q21d      09/22/2021 - 09/21/2022 Chemotherapy   Patient is on Treatment Plan : BREAST Trastuzumab  + Pertuzumab  q21d     05/11/2022 Imaging   CT chest, abdomen and pelvis:  IMPRESSION:  1. Widespread bony metastatic disease shows increased sclerosis  since previous imaging. This is likely related to response to  therapy in the interval. There is a single lesion along the LEFT  iliac which shows continued lytic change and warrants attention on  follow-up  2. Interval development of pathologic fractures at in the upper  thoracic spine and at the thoracolumbar junction with associated  kyphosis.  3. No signs of solid organ or nodal metastatic disease.  4. Marked elevation of the LEFT hemidiaphragm as on the prior PET.  5. Aortic atherosclerosis.    10/12/2022 -  Chemotherapy   Patient is on Treatment Plan : BREAST METASTATIC Fam-Trastuzumab Deruxtecan-nxki  (Enhertu ) (5.4) q28d (changed from q21d on 12/25/23)       INTERVAL HISTORY:  Anna [name] for her metastatic hormone and HER2 receptor positive breast cancer. Patient states that she feels ok but complains of usual chronic pain which has been controlled with oxycodone  10mg  Q4hrs prn, constipation, and dysuria. I will order a urine culture and urinalysis. The blisters on her right leg are now developing on the left, she informed me that [facility]. She did meet with Dr. Charlanne in June who decided not to do a colonoscopy, but did  schedule a endoscopy and advised her to continue her omeprazole  40 mg daily. Her day 1 cycle 22 of Enhertu  is scheduled on 02/21/2024. She has a WBC of 6.2, low hemoglobin of 11.0 down from 11.4, and platelet count of 290,0000. Her CMP is normal other than an elevated BUN of 25 and low total protein of 5.9. Her BP was also low today at 95/73 and considering her BUN level I will add 1L of normal saline. I recommended she increase protein in her diet. I will see her back in 4 weeks with CBC and CMP.   She denies fever, chills, night sweats, or other signs of infection. She denies cardiorespiratory and gastrointestinal issues. Her appetite is ok and Her weight has increased 3 pounds over last  month.   REVIEW OF SYSTEMS:   Review of Systems  Constitutional:  Negative for appetite change, chills, fatigue, fever and unexpected weight change.  HENT:   Negative for lump/mass, mouth sores, sore throat and trouble swallowing.   Respiratory:  Negative for cough and shortness of breath.   Cardiovascular:  Negative for chest pain and leg swelling.  Gastrointestinal:  Positive for constipation. Negative for abdominal pain, diarrhea and vomiting. Nausea: some. Genitourinary:  Positive for dysuria. Negative for bladder incontinence, difficulty urinating, frequency and hematuria.   Musculoskeletal:  Positive for back pain (Lower back) and neck pain. Negative for arthralgias, gait problem and myalgias.  Skin:  Positive for wound (Bilateral leg ulcers, newly on the left). Negative for rash.  Neurological:  Negative for dizziness, extremity weakness, gait problem, headaches, light-headedness and numbness.  Hematological:  Negative for adenopathy. Does not bruise/bleed easily.  Psychiatric/Behavioral:  Negative for depression and sleep disturbance. The patient is not nervous/anxious.     VITALS:   Blood pressure 95/73, pulse 98, temperature 98.2 F (36.8 C), resp. rate 18, height 4' 8.5 (1.435 m), weight 129 lb 8  oz (58.7 kg), last menstrual period 08/18/2013, SpO2 98%.  Wt Readings from Last 3 Encounters:  02/21/24 129 lb 8 oz (58.7 kg)  01/27/24 129 lb (58.5 kg)  01/24/24 126 lb 1.6 oz (57.2 kg)    Body mass index is 28.52 kg/m.  Performance status (ECOG): 1 - Symptomatic but completely ambulatory    PHYSICAL EXAM:   Physical Exam Vitals and nursing note reviewed.  Constitutional:      General: She is not in acute distress.    Appearance: She is normal weight. She is ill-appearing (Chronically ill-appearing).  HENT:     Head: Normocephalic and atraumatic.     Comments: She has some firm swelling in the submental area    Right Ear: Tympanic membrane, ear canal and external ear normal.     Left Ear: Tympanic membrane, ear canal and external ear normal.     Nose: Nose normal.     Mouth/Throat:     Mouth: Mucous membranes are moist.     Pharynx: Oropharynx is clear. No oropharyngeal exudate or posterior oropharyngeal erythema.  Eyes:     General: No scleral icterus.    Extraocular Movements: Extraocular movements intact.     Conjunctiva/sclera: Conjunctivae normal.     Pupils: Pupils are equal, round, and reactive to light.  Cardiovascular:     Rate and Rhythm: Normal rate and regular rhythm.     Pulses: Normal pulses.     Heart sounds: Normal heart sounds. No murmur heard.    No friction rub. No gallop.  Pulmonary:     Effort: Pulmonary effort is normal.     Breath sounds: Normal breath sounds. No wheezing, rhonchi or rales.  Chest:     Comments: Breast exam is deferred Abdominal:     General: Bowel sounds are normal. There is no distension.     Palpations: Abdomen is soft. There is no hepatomegaly, splenomegaly or mass.     Tenderness: There is no abdominal tenderness.     Comments: Felt slightly firm, no other new things on physical exam  Musculoskeletal:        General: Normal range of motion.     Cervical back: Normal range of motion and neck supple. No tenderness.      Right lower leg: Edema (mild) present.     Left lower leg: Edema (mild) present.  Lymphadenopathy:  Cervical: No cervical adenopathy.     Upper Body:     Right upper body: No supraclavicular or axillary adenopathy.     Left upper body: No supraclavicular or axillary adenopathy.     Lower Body: No right inguinal adenopathy. No left inguinal adenopathy.  Skin:    General: Skin is warm and dry.     Coloration: Skin is not jaundiced.     Findings: No rash.  Neurological:     General: No focal deficit present.     Mental Status: She is alert and oriented to person, place, and time. Mental status is at baseline.     Cranial Nerves: No cranial nerve deficit.  Psychiatric:        Mood and Affect: Mood normal.        Behavior: Behavior normal.        Thought Content: Thought content normal.        Judgment: Judgment normal.     LABS:      Latest Ref Rng & Units 02/21/2024    8:08 AM 01/24/2024    9:39 AM 12/25/2023    8:37 AM  CBC  WBC 4.0 - 10.5 K/uL 6.2  7.0  7.4   Hemoglobin 12.0 - 15.0 g/dL 88.9  88.5  89.2   Hematocrit 36.0 - 46.0 % 35.3  36.9  35.2   Platelets 150 - 400 K/uL 290  321  270       Latest Ref Rng & Units 02/21/2024    8:08 AM 01/24/2024    9:39 AM 12/25/2023    8:37 AM  CMP  Glucose 70 - 99 mg/dL 895  864  848   BUN 6 - 20 mg/dL 25  24  12    Creatinine 0.44 - 1.00 mg/dL 9.45  9.43  9.37   Sodium 135 - 145 mmol/L 139  140  140   Potassium 3.5 - 5.1 mmol/L 4.1  4.1  3.5   Chloride 98 - 111 mmol/L 103  104  102   CO2 22 - 32 mmol/L 28  26  27    Calcium  8.9 - 10.3 mg/dL 9.5  9.7  9.1   Total Protein 6.5 - 8.1 g/dL 5.9  6.1  5.8   Total Bilirubin 0.0 - 1.2 mg/dL 0.2  <9.7  <9.7   Alkaline Phos 38 - 126 U/L 94  95  93   AST 15 - 41 U/L 19  14  13    ALT 0 - 44 U/L 16  10  11     Lab Results  Component Value Date   CEA1 2.2 07/20/2021   /  CEA  Date Value Ref Range Status  07/20/2021 2.2 0.0 - 4.7 ng/mL Final    Comment:    (NOTE)                              Nonsmokers          <3.9                             Smokers             <5.6 [employer] Electrochemiluminescence Immunoassay (ECLIA) Values obtained with different assay methods or kits cannot be used interchangeably.  Results cannot be interpreted as absolute evidence of the presence or absence of malignant disease. Performed At: [facility] [address] [place], [place]  [phone] Jennette Shorter MD Ey:[phone]     Lab Results  Component Value Date   TIBC 269 01/24/2024   TIBC 251 10/25/2023   TIBC 225 (L) 07/10/2023   FERRITIN 326 (H) 01/24/2024   FERRITIN 129 10/25/2023   FERRITIN 305 07/10/2023   IRONPCTSAT 13 01/24/2024   IRONPCTSAT 8 (L) 10/25/2023   IRONPCTSAT 11 07/10/2023   Lab Results  Component Value Date   LDH 143 10/25/2023   LDH 171 07/10/2023    STUDIES:   No results found.   EXAM: 09/05/2023 CT CHEST, ABDOMEN, AND PELVIS WITH CONTRAST IMPRESSION: 1. Unchanged size of the mass in the left breast with overlying skin thickening. 2. No significant interval change in the widespread osseous metastatic disease involving the axial and proximal appendicular skeleton. 3. Similar soft tissue thickening about the left subclavian structures and thoracic inlet, possibly reflecting posttreatment change. Suggest continued attention on follow-up imaging. 4. No evidence of new or progressive disease within the chest, abdomen or pelvis. 5.  Aortic Atherosclerosis (ICD10-I70.0).  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   Bipolar disorder (HCC) 11/29/2020   Breast cancer (HCC)    Cancer (HCC)    Cardiac murmur    Chest pain    Chronic fatigue syndrome 11/29/2020   Depression    Essential hypertension 11/29/2020   Hepatic  steatosis determined by biopsy of liver 07/04/2020   HLD (hyperlipidemia)    Hypertension    Hypomagnesemia 03/26/2023   Hypothyroidism 11/29/2020   Insomnia 11/29/2020   Iron  deficiency anemia 06/05/2022   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   Migraine 11/29/2020   Mild intermittent asthma 11/29/2020   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   Mixed hyperlipidemia 11/29/2020   Obesity due to excess calories    Osteopenia after menopause 07/04/2020   Other long term (current) drug therapy 11/29/2020   Other vitamin B12 deficiency anemias 11/29/2020   Personal history of malignant neoplasm of breast 11/29/2020   Pre-diabetes    Renal cyst, acquired, right 07/04/2020   14.5 cm in October 2021   Retrocalcaneal bursitis (back of heel), left 04/22/2018   Seasonal allergic rhinitis 11/29/2020   Tightness of heel cord, left 04/22/2018   Tremor 08/02/2023   Type 2 diabetes mellitus without complications (HCC) 11/29/2020   Vitamin D  deficiency 11/29/2020    Past Surgical History:  Procedure Laterality Date   CESAREAN SECTION     IR BONE TUMOR(S)RF ABLATION  05/29/2022   IR BONE TUMOR(S)RF ABLATION  05/29/2022   IR KYPHO EA ADDL LEVEL THORACIC OR LUMBAR  05/29/2022   IR KYPHO THORACIC WITH BONE BIOPSY  05/29/2022   IR RADIOLOGIST EVAL & MGMT  05/22/2022   IR RADIOLOGIST EVAL & MGMT  06/12/2022   LIVER BIOPSY  01/2019   lumpectomy  2014   Left Breast   PORT-A-CATH REMOVAL     PORTACATH PLACEMENT  08/28/2013   STOMACH SURGERY     Tummy Tuck   TOTAL HIP ARTHROPLASTY Right 07/04/2021   Procedure: TOTAL HIP ARTHROPLASTY;  Surgeon: Ernie Cough, MD;  Location: [facility];  Service: Orthopedics;  Laterality: Right;  90   WISDOM TOOTH EXTRACTION      Family History  Problem Relation Age of Onset   Hyperlipidemia Mother  Diabetes Father    Stroke Father    Colon cancer Neg Hx    Colon polyps Neg Hx    Esophageal cancer Neg Hx     Rectal cancer Neg Hx    Stomach cancer Neg Hx     Social History:  reports that she quit smoking about 21 years ago. Her smoking use included cigarettes. She has never used smokeless tobacco. She reports current alcohol use. She reports that she does not use drugs.The patient is accompanied by her husband today.  Allergies:  Allergies  Allergen Reactions   Reglan  [Metoclopramide ]     Made patient feel really funny when taking this medication    Xgeva  [Denosumab ] Other (See Comments)    Possible ONJ (09/13/23)    Current Medications: Current Outpatient Medications  Medication Sig Dispense Refill   albuterol  (VENTOLIN  HFA) 108 (90 Base) MCG/ACT inhaler Inhale 2 puffs into the lungs every 6 (six) hours as needed for shortness of breath.     amoxicillin  (AMOXIL ) 500 MG capsule TAKE ONE CAPSULE BY MOUTH FOUR TIMES A DAY 40 capsule 0   ARIPiprazole  (ABILIFY ) 5 MG tablet Take 2.5 mg by mouth daily.     aspirin 81 MG EC tablet Take 81 mg by mouth daily. Swallow whole.     atorvastatin  (LIPITOR) 20 MG tablet Take 1 tablet (20 mg total) by mouth daily. 90 tablet 3   augmented betamethasone dipropionate (DIPROLENE-AF) 0.05 % cream  (Patient taking differently: 1 Application as needed.)     calcium  carbonate (OS-CAL) 1250 (500 Ca) MG chewable tablet Chew by mouth 3 (three) times daily.     dapagliflozin  propanediol (FARXIGA ) 10 MG TABS tablet TAKE ONE TABLET BY MOUTH EVERY DAY 30 tablet 10   desvenlafaxine  (PRISTIQ ) 50 MG 24 hr tablet Take 50 mg by mouth daily.     dexamethasone  (DECADRON ) 4 MG tablet TAKE ONE TABLET BY MOUTH 2 TIMES A DAY FOR 5 DAYS AFTER CHEMOTHERAPY 20 tablet 2   diphenoxylate -atropine  (LOMOTIL ) 2.5-0.025 MG tablet TAKE TWO TABLETS BY MOUTH FOUR TIMES A DAY AS NEEDED FOR DIARRHEA OR LOOSE STOOLS 100 tablet 0   folic acid  (FOLVITE ) 1 MG tablet Take 1 tablet (1 mg total) by mouth daily. 30 tablet 5   labetalol  (NORMODYNE ) 100 MG tablet Take 1 tablet (100 mg total) by mouth 2  (two) times daily. 180 tablet 3   loratadine  (CLARITIN ) 10 MG tablet TAKE ONE TABLET BY MOUTH EVERY DAY 30 tablet 5   LORazepam  (ATIVAN ) 1 MG tablet Take 1 tablet (1 mg total) by mouth every 6 (six) hours as needed for anxiety (nausea). 60 tablet 0   magic mouthwash (lidocaine , diphenhydrAMINE , alum & mag hydroxide) suspension Take 5 mLs by mouth every 3 (three) hours as needed. Swish and spit; for throat/mouth pain     naloxone  (NARCAN ) nasal spray 4 mg/0.1 mL One spray in nostril as needed, may repeat every 2 to 3 minutes until medical assistance becomes available (Patient not taking: Reported on 01/27/2024) 4 each 1   nitroGLYCERIN  (NITROSTAT ) 0.4 MG SL tablet Place 0.4 mg under the tongue every 5 (five) minutes as needed for chest pain.     nystatin  (MYCOSTATIN /NYSTOP ) powder Apply 1 Application topically 2 (two) times daily.     OLANZapine  (ZYPREXA ) 5 MG tablet Take 5 mg by mouth at bedtime.     omeprazole  (PRILOSEC) 40 MG capsule Take 1 capsule (40 mg total) by mouth at bedtime. 30 capsule 5   ondansetron  (ZOFRAN ) 8 MG tablet  Take 1 tablet (8 mg total) by mouth every 8 (eight) hours as needed for nausea or vomiting. 60 tablet 5   Oxycodone  HCl 10 MG TABS TAKE ONE TABLET BY MOUTH EVERY 4 HOURS AS NEEDED FOR PAIN 180 tablet 0   Potassium 99 MG TABS Take 2 tablets by mouth 2 (two) times daily. GUMMIES     prochlorperazine  (COMPAZINE ) 10 MG tablet TAKE ONE TABLET BY MOUTH EVERY 6 HOURS AS NEEDED FOR NAUSEA/VOMITING 90 tablet 3   Vibegron (GEMTESA) 75 MG TABS Take 1 tablet by mouth daily.     Current Facility-Administered Medications  Medication Dose Route Frequency Provider Last Rate Last Admin   technetium sestamibi generic (CARDIOLITE ) injection 10.5 millicurie  10.5 millicurie Intravenous Once PRN Revankar, Rajan R, MD       Facility-Administered Medications Ordered in Other Visits  Medication Dose Route Frequency Provider Last Rate Last Admin   heparin  lock flush 100 unit/mL  500 Units  Intracatheter Once PRN Parsons, Melissa A, NP       sodium chloride  flush (NS) 0.9 % injection 10 mL  10 mL Intracatheter PRN Mosher, Kelli A, PA-C   10 mL at 11/15/23 1111   sodium chloride  flush (NS) 0.9 % injection 10 mL  10 mL Intracatheter PRN Mosher, Kelli A, PA-C       sodium chloride  flush (NS) 0.9 % injection 10 mL  10 mL Intravenous PRN Anna Wanda DEL, MD   10 mL at 12/06/23 1109   sodium chloride  flush 0.9 % injection 10 mL  10 mL Intravenous PRN Anna Wanda DEL, MD   10 mL at 11/05/23 9089    I,Jasmine M Lassiter,acting as a scribe for Wanda [name] Cornelius, MD.,have documented all relevant documentation on the behalf of Wanda [name] Cornelius, MD,as directed by  Wanda [name] Cornelius, MD while in the presence of Wanda [name] Cornelius, MD.

## 2024-02-21 ENCOUNTER — Inpatient Hospital Stay: Attending: Hematology and Oncology

## 2024-02-21 ENCOUNTER — Inpatient Hospital Stay

## 2024-02-21 ENCOUNTER — Inpatient Hospital Stay (HOSPITAL_BASED_OUTPATIENT_CLINIC_OR_DEPARTMENT_OTHER): Admitting: Oncology

## 2024-02-21 ENCOUNTER — Other Ambulatory Visit: Payer: Self-pay | Admitting: Oncology

## 2024-02-21 ENCOUNTER — Encounter: Payer: Self-pay | Admitting: Oncology

## 2024-02-21 VITALS — BP 101/67 | HR 90

## 2024-02-21 VITALS — BP 95/73 | HR 98 | Temp 98.2°F | Resp 18 | Ht <= 58 in | Wt 129.5 lb

## 2024-02-21 DIAGNOSIS — Z5111 Encounter for antineoplastic chemotherapy: Secondary | ICD-10-CM | POA: Diagnosis present

## 2024-02-21 DIAGNOSIS — C7951 Secondary malignant neoplasm of bone: Secondary | ICD-10-CM

## 2024-02-21 DIAGNOSIS — Z5112 Encounter for antineoplastic immunotherapy: Secondary | ICD-10-CM | POA: Diagnosis not present

## 2024-02-21 DIAGNOSIS — C50412 Malignant neoplasm of upper-outer quadrant of left female breast: Secondary | ICD-10-CM

## 2024-02-21 DIAGNOSIS — R300 Dysuria: Secondary | ICD-10-CM

## 2024-02-21 DIAGNOSIS — Z170 Estrogen receptor positive status [ER+]: Secondary | ICD-10-CM

## 2024-02-21 LAB — CBC WITH DIFFERENTIAL (CANCER CENTER ONLY)
Abs Immature Granulocytes: 0.04 K/uL (ref 0.00–0.07)
Basophils Absolute: 0 K/uL (ref 0.0–0.1)
Basophils Relative: 1 %
Eosinophils Absolute: 0.3 K/uL (ref 0.0–0.5)
Eosinophils Relative: 5 %
HCT: 35.3 % — ABNORMAL LOW (ref 36.0–46.0)
Hemoglobin: 11 g/dL — ABNORMAL LOW (ref 12.0–15.0)
Immature Granulocytes: 1 %
Lymphocytes Relative: 13 %
Lymphs Abs: 0.8 K/uL (ref 0.7–4.0)
MCH: 25.9 pg — ABNORMAL LOW (ref 26.0–34.0)
MCHC: 31.2 g/dL (ref 30.0–36.0)
MCV: 83.3 fL (ref 80.0–100.0)
Monocytes Absolute: 0.8 K/uL (ref 0.1–1.0)
Monocytes Relative: 13 %
Neutro Abs: 4.3 K/uL (ref 1.7–7.7)
Neutrophils Relative %: 67 %
Platelet Count: 290 K/uL (ref 150–400)
RBC: 4.24 MIL/uL (ref 3.87–5.11)
RDW: 19.2 % — ABNORMAL HIGH (ref 11.5–15.5)
WBC Count: 6.2 K/uL (ref 4.0–10.5)
nRBC: 0 % (ref 0.0–0.2)

## 2024-02-21 LAB — URINALYSIS, COMPLETE (UACMP) WITH MICROSCOPIC
Bilirubin Urine: NEGATIVE
Glucose, UA: >=500 mg/dL — AB
Ketones, ur: NEGATIVE mg/dL
Nitrite: POSITIVE — AB
Protein, ur: NEGATIVE mg/dL
Specific Gravity, Urine: 1.015 (ref 1.005–1.030)
WBC, UA: >50 WBC/hpf (ref 0–5)
pH: 6 (ref 5.0–8.0)

## 2024-02-21 LAB — CMP (CANCER CENTER ONLY)
ALT: 16 U/L (ref 0–44)
AST: 19 U/L (ref 15–41)
Albumin: 3.5 g/dL (ref 3.5–5.0)
Alkaline Phosphatase: 94 U/L (ref 38–126)
Anion gap: 9 (ref 5–15)
BUN: 25 mg/dL — ABNORMAL HIGH (ref 6–20)
CO2: 28 mmol/L (ref 22–32)
Calcium: 9.5 mg/dL (ref 8.9–10.3)
Chloride: 103 mmol/L (ref 98–111)
Creatinine: 0.54 mg/dL (ref 0.44–1.00)
GFR, Estimated: >60 mL/min (ref >60)
Glucose, Bld: 104 mg/dL — ABNORMAL HIGH (ref 70–99)
Potassium: 4.1 mmol/L (ref 3.5–5.1)
Sodium: 139 mmol/L (ref 135–145)
Total Bilirubin: 0.2 mg/dL (ref 0.0–1.2)
Total Protein: 5.9 g/dL — ABNORMAL LOW (ref 6.5–8.1)

## 2024-02-21 MED ORDER — SODIUM CHLORIDE 0.9 % IV SOLN: Freq: Once | INTRAVENOUS | Status: AC

## 2024-02-21 MED ORDER — SODIUM CHLORIDE 0.9% FLUSH
10.0000 mL | INTRAVENOUS | Status: DC | PRN
  Administered 2024-02-21: 10 mL

## 2024-02-21 MED ORDER — DIPHENHYDRAMINE HCL 25 MG PO CAPS: 50.0000 mg | ORAL_CAPSULE | Freq: Once | ORAL | Status: DC

## 2024-02-21 MED ORDER — ACETAMINOPHEN 325 MG PO TABS
650.0000 mg | ORAL_TABLET | Freq: Once | ORAL | Status: DC
Start: 2024-02-21 — End: 2024-02-21

## 2024-02-21 MED ORDER — DEXTROSE 5 % IV SOLN: Freq: Once | INTRAVENOUS | Status: AC

## 2024-02-21 MED ORDER — HEPARIN SOD (PORK) LOCK FLUSH 100 UNIT/ML IV SOLN
500.0000 [IU] | Freq: Once | INTRAVENOUS | Status: AC | PRN
  Administered 2024-02-21: 500 [IU]

## 2024-02-21 MED ORDER — SODIUM CHLORIDE 0.9 % IV SOLN
150.0000 mg | Freq: Once | INTRAVENOUS | Status: AC
  Administered 2024-02-21: 150 mg via INTRAVENOUS
  Filled 2024-02-21: qty 150

## 2024-02-21 MED ORDER — DEXAMETHASONE SODIUM PHOSPHATE 10 MG/ML IJ SOLN
10.0000 mg | Freq: Once | INTRAMUSCULAR | Status: AC
  Administered 2024-02-21: 10 mg via INTRAVENOUS
  Filled 2024-02-21: qty 1

## 2024-02-21 MED ORDER — FULVESTRANT 250 MG/5ML IM SOSY
500.0000 mg | PREFILLED_SYRINGE | Freq: Once | INTRAMUSCULAR | Status: AC
Start: 2024-02-21 — End: 2024-02-21
  Administered 2024-02-21: 500 mg via INTRAMUSCULAR
  Filled 2024-02-21: qty 10

## 2024-02-21 MED ORDER — FAM-TRASTUZUMAB DERUXTECAN-NXKI CHEMO 100 MG IV SOLR
5.3000 mg/kg | Freq: Once | INTRAVENOUS | Status: AC
  Administered 2024-02-21: 300 mg via INTRAVENOUS
  Filled 2024-02-21: qty 15

## 2024-02-21 MED ORDER — PALONOSETRON HCL INJECTION 0.25 MG/5ML
0.2500 mg | Freq: Once | INTRAVENOUS | Status: AC
  Administered 2024-02-21: 0.25 mg via INTRAVENOUS
  Filled 2024-02-21: qty 5

## 2024-02-21 NOTE — Patient Instructions (Signed)
 Fam-Trastuzumab Deruxtecan Injection What is this medication? FAM-TRASTUZUMAB DERUXTECAN (fam-tras TOOZ eu mab DER ux TEE kan) treats some types of cancer. It works by blocking a protein that causes cancer cells to grow and multiply. This helps to slow or stop the spread of cancer cells. This medicine may be used for other purposes; ask your health care provider or pharmacist if you have questions. COMMON BRAND NAME(S): ENHERTU What should I tell my care team before I take this medication? They need to know if you have any of these conditions: Heart disease Heart failure Infection, especially a viral infection, such as chickenpox, cold sores, or herpes Liver disease Lung or breathing disease, such as asthma or COPD An unusual or allergic reaction to fam-trastuzumab deruxtecan, other medications, foods, dyes, or preservatives Pregnant or trying to get pregnant Breast-feeding How should I use this medication? This medication is injected into a vein. It is given by your care team in a hospital or clinic setting. A special MedGuide will be given to you before each treatment. Be sure to read this information carefully each time. Talk to your care team about the use of this medication in children. Special care may be needed. Overdosage: If you think you have taken too much of this medicine contact a poison control center or emergency room at once. NOTE: This medicine is only for you. Do not share this medicine with others. What if I miss a dose? It is important not to miss your dose. Call your care team if you are unable to keep an appointment. What may interact with this medication? Interactions are not expected. This list may not describe all possible interactions. Give your health care provider a list of all the medicines, herbs, non-prescription drugs, or dietary supplements you use. Also tell them if you smoke, drink alcohol, or use illegal drugs. Some items may interact with your  medicine. What should I watch for while using this medication? Visit your care team for regular checks on your progress. Tell your care team if your symptoms do not start to get better or if they get worse. This medication may increase your risk of getting an infection. Call your care team for advice if you get a fever, chills, sore throat, or other symptoms of a cold or flu. Do not treat yourself. Try to avoid being around people who are sick. Avoid taking medications that contain aspirin, acetaminophen, ibuprofen, naproxen, or ketoprofen unless instructed by your care team. These medications may hide a fever. Be careful brushing or flossing your teeth or using a toothpick because you may get an infection or bleed more easily. If you have any dental work done, tell your dentist you are receiving this medication. This medication may cause dry eyes and blurred vision. If you wear contact lenses, you may feel some discomfort. Lubricating eye drops may help. See your care team if the problem does not go away or is severe. Talk to your care team if you may be pregnant. Serious birth defects can occur if you take this medication during pregnancy and for 7 months after the last dose. If your partner can get pregnant, use a condom during sex while taking this medication and for 4 months after the last dose. Do not breastfeed while taking this medication and for 7 months after the last dose. This medication may cause infertility. Talk to your care team if you are concerned about your fertility. What side effects may I notice from receiving this medication? Side effects  that you should report to your care team as soon as possible: Allergic reactions--skin rash, itching, hives, swelling of the face, lips, tongue, or throat Dry cough, shortness of breath or trouble breathing Infection--fever, chills, cough, sore throat, wounds that don't heal, pain or trouble when passing urine, general feeling of discomfort or  being unwell Heart failure--shortness of breath, swelling of the ankles, feet, or hands, sudden weight gain, unusual weakness or fatigue Unusual bruising or bleeding Side effects that usually do not require medical attention (report these to your care team if they continue or are bothersome): Constipation Diarrhea Hair loss Muscle pain Nausea Vomiting This list may not describe all possible side effects. Call your doctor for medical advice about side effects. You may report side effects to FDA at 1-800-FDA-1088. Where should I keep my medication? This medication is given in a hospital or clinic. It will not be stored at home. NOTE: This sheet is a summary. It may not cover all possible information. If you have questions about this medicine, talk to your doctor, pharmacist, or health care provider.  2024 [employer] (2023-03-29 00:00:00)

## 2024-02-23 LAB — URINE CULTURE: Culture: >=100000 — AB

## 2024-03-05 ENCOUNTER — Encounter: Payer: Self-pay | Admitting: Gastroenterology

## 2024-03-10 ENCOUNTER — Encounter: Payer: Self-pay | Admitting: Oncology

## 2024-03-10 ENCOUNTER — Telehealth: Payer: Self-pay

## 2024-03-10 ENCOUNTER — Other Ambulatory Visit: Payer: Self-pay | Admitting: Oncology

## 2024-03-10 DIAGNOSIS — B9689 Other specified bacterial agents as the cause of diseases classified elsewhere: Secondary | ICD-10-CM

## 2024-03-10 MED ORDER — SULFAMETHOXAZOLE-TRIMETHOPRIM 800-160 MG PO TABS: 1.0000 | ORAL_TABLET | Freq: Two times a day (BID) | ORAL | 0 refills | Status: DC

## 2024-03-10 NOTE — Telephone Encounter (Signed)
 Called patient and notified her of the urine culture results and asked if she has been on an antibiotic for it and she said no. Also notified patient that Dr. Cornelius sent in a script to the pharmacy for her. Patient voiced her understanding\.

## 2024-03-10 NOTE — Telephone Encounter (Signed)
-----   Message from Wanda VEAR Cornish sent at 03/10/2024 12:17 PM EDT ----- Regarding: RE: urine Pls tell her I am so sorry, she did have a UTI and I was out of town when the culture report came in.  I will send in sulfa  ab as it appears to be resistant to ampicillin ----- Message ----- From: Janit Jon HERO, LPN Sent: 2/70/7974  10:14 AM EDT To: Wanda VEAR Cornish, MD Subject: FW: urine                                      I do not see where anything was called into pharmacy for her.   Jon, LPN ----- Message ----- From: Cornish Wanda VEAR, MD Sent: 03/10/2024   7:13 AM EDT To: Wanda VEAR Cornish, MD; Jon HERO Janit, LPN Subject: urine                                          Her urine cult from 7/11 was pos for Klebsiella, did we ever treat that? I went out of town after that so didn't see  She will have EGD 8/1 by Dr. Charlanne, look for result next week

## 2024-03-13 ENCOUNTER — Other Ambulatory Visit (HOSPITAL_COMMUNITY): Payer: Self-pay

## 2024-03-13 ENCOUNTER — Encounter: Payer: Self-pay | Admitting: Gastroenterology

## 2024-03-13 ENCOUNTER — Telehealth: Payer: Self-pay

## 2024-03-13 ENCOUNTER — Encounter: Payer: Self-pay | Admitting: Oncology

## 2024-03-13 ENCOUNTER — Ambulatory Visit: Admitting: Gastroenterology

## 2024-03-13 VITALS — BP 108/62 | HR 107 | Temp 97.2°F | Resp 20 | Ht <= 58 in | Wt 129.0 lb

## 2024-03-13 DIAGNOSIS — K269 Duodenal ulcer, unspecified as acute or chronic, without hemorrhage or perforation: Secondary | ICD-10-CM | POA: Diagnosis not present

## 2024-03-13 DIAGNOSIS — K921 Melena: Secondary | ICD-10-CM

## 2024-03-13 DIAGNOSIS — Q399 Congenital malformation of esophagus, unspecified: Secondary | ICD-10-CM

## 2024-03-13 DIAGNOSIS — D509 Iron deficiency anemia, unspecified: Secondary | ICD-10-CM | POA: Diagnosis not present

## 2024-03-13 DIAGNOSIS — K2970 Gastritis, unspecified, without bleeding: Secondary | ICD-10-CM | POA: Diagnosis not present

## 2024-03-13 DIAGNOSIS — K222 Esophageal obstruction: Secondary | ICD-10-CM

## 2024-03-13 DIAGNOSIS — K3189 Other diseases of stomach and duodenum: Secondary | ICD-10-CM

## 2024-03-13 DIAGNOSIS — K2940 Chronic atrophic gastritis without bleeding: Secondary | ICD-10-CM | POA: Diagnosis not present

## 2024-03-13 DIAGNOSIS — K259 Gastric ulcer, unspecified as acute or chronic, without hemorrhage or perforation: Secondary | ICD-10-CM

## 2024-03-13 DIAGNOSIS — R112 Nausea with vomiting, unspecified: Secondary | ICD-10-CM

## 2024-03-13 MED ORDER — SODIUM CHLORIDE 0.9 % IV SOLN: 500.0000 mL | Freq: Once | INTRAVENOUS | Status: DC

## 2024-03-13 MED ORDER — SUCRALFATE 1 GM/10ML PO SUSP: 1.0000 g | Freq: Four times a day (QID) | ORAL | 0 refills | Status: DC

## 2024-03-13 NOTE — Telephone Encounter (Signed)
Please see notes below and advise 

## 2024-03-13 NOTE — Progress Notes (Signed)
 1020 Report given to PACU, vss

## 2024-03-13 NOTE — Progress Notes (Signed)
 Called to room to assist during endoscopic procedure.  Patient ID and intended procedure confirmed with present staff. Received instructions for my participation in the procedure from the performing physician.

## 2024-03-13 NOTE — Progress Notes (Signed)
 Chief Complaint:   Referring Provider:  Pia Kerney SQUIBB, MD      ASSESSMENT AND PLAN;   #1. N/V likely d/t chemo. H/O 1 episode of melena  #2. IDA. Neg CT AP Jan 2025  #3. Stage IV Breast Ca with bone mets  Plan: -Continue omeprazole  40mg  po every day -EGD -She wants to hold off on colon. I agree. -Continue compazine /zofran      HPI:    Anna [name] is a 53 y.o. female  History of Present Illness With Stage IV Breast cancer with extensive bone metastasis on THP/XRT who presents with gastrointestinal symptoms. She was referred by Dr. Cornelius for evaluation of IDA. Enhertu  was stopped due to GI toxicity.  Hb 6.4 (10/2023) s/p 3U to Hb 11.4  IDA thought to be d/t extensive marrow replacing mets or recent Ortho surgery However, she had 1 episode of melanotic appearing stools which has resolved.  Has been having occasional nausea/vomiting/heartburn With omeprazole /Compazine /Zofran   Had negative CT Abdo/pelvis for any metastatic lesions  She also had negative colonoscopy except for a 10 mm colonic polyp 02/2021  Denies use of nonsteroidals.  Has more constipation than diarrhea.  No rectal bleeding.  At this time she denies having any odynophagia or dysphagia.  Does not want repeat colonoscopy at this time.    Past GI workup: Colon 02/2021 - One 10 mm polyp at the recto- sigmoid colon, removed with a hot snare. Resected and retrieved. - Mild sigmoid diverticulosis. - Non- bleeding internal hemorrhoids. - The examination was otherwise normal - Bx- TV adenoma - Recommended to repeat in 3 years.  She would like to hold off currently  CT Abdo/pelvis with contrast Jan 2025-negative for any intra-abdominal mets. 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/2023   Bipolar  disorder (HCC) 11/29/2020   Breast cancer (HCC)    Cancer (HCC)    Cardiac murmur    Chest pain    Chronic fatigue syndrome 11/29/2020   Depression    Essential hypertension 11/29/2020   Hepatic steatosis determined by biopsy of liver 07/04/2020   HLD (hyperlipidemia)    Hypertension    Hypomagnesemia 03/26/2023   Hypothyroidism 11/29/2020   Insomnia 11/29/2020   Iron  deficiency anemia 06/05/2022   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   Migraine 11/29/2020   Mild intermittent asthma 11/29/2020   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   Mixed hyperlipidemia 11/29/2020   Obesity due to excess calories    Osteopenia after menopause 07/04/2020   Other long term (current) drug therapy 11/29/2020   Other vitamin B12 deficiency anemias 11/29/2020   Personal history of malignant neoplasm of breast 11/29/2020   Pre-diabetes    Renal cyst, acquired, right 07/04/2020   14.5 cm in October 2021   Retrocalcaneal bursitis (back of heel), left 04/22/2018   Seasonal allergic rhinitis 11/29/2020   Tightness of heel cord, left 04/22/2018   Tremor 08/02/2023   Type 2 diabetes mellitus without complications (HCC) 11/29/2020   Vitamin D  deficiency 11/29/2020    Past Surgical History:  Procedure Laterality Date   CESAREAN SECTION     IR BONE TUMOR(S)RF ABLATION  05/29/2022   IR BONE TUMOR(S)RF ABLATION  05/29/2022   IR KYPHO EA ADDL LEVEL THORACIC OR LUMBAR  05/29/2022   IR KYPHO  THORACIC WITH BONE BIOPSY  05/29/2022   IR RADIOLOGIST EVAL & MGMT  05/22/2022   IR RADIOLOGIST EVAL & MGMT  06/12/2022   LIVER BIOPSY  01/2019   lumpectomy  2014   Left Breast   PORT-A-CATH REMOVAL     PORTACATH PLACEMENT  08/28/2013   STOMACH SURGERY     Tummy Tuck   TOTAL HIP ARTHROPLASTY Right 07/04/2021   Procedure: TOTAL HIP ARTHROPLASTY;  Surgeon: Ernie Cough, MD;  Location: [facility];  Service: Orthopedics;  Laterality: Right;  90   WISDOM TOOTH  EXTRACTION      Family History  Problem Relation Age of Onset   Hyperlipidemia Mother    Diabetes Father    Stroke Father    Colon cancer Neg Hx    Colon polyps Neg Hx    Esophageal cancer Neg Hx    Rectal cancer Neg Hx    Stomach cancer Neg Hx     Social History   Tobacco Use   Smoking status: Former    Current packs/day: 0.00    Types: Cigarettes    Quit date: 2004    Years since quitting: 21.5   Smokeless tobacco: Never  Vaping Use   Vaping status: Never Used  Substance Use Topics   Alcohol use: Yes    Comment: occasionally   Drug use: Never    Current Outpatient Medications  Medication Sig Dispense Refill   albuterol  (VENTOLIN  HFA) 108 (90 Base) MCG/ACT inhaler Inhale 2 puffs into the lungs every 6 (six) hours as needed for shortness of breath.     amoxicillin  (AMOXIL ) 500 MG capsule TAKE ONE CAPSULE BY MOUTH FOUR TIMES A DAY 40 capsule 0   ARIPiprazole  (ABILIFY ) 5 MG tablet Take 2.5 mg by mouth daily.     aspirin 81 MG EC tablet Take 81 mg by mouth daily. Swallow whole.     atorvastatin  (LIPITOR) 20 MG tablet Take 1 tablet (20 mg total) by mouth daily. 90 tablet 3   augmented betamethasone dipropionate (DIPROLENE-AF) 0.05 % cream  (Patient taking differently: 1 Application as needed.)     calcium  carbonate (OS-CAL) 1250 (500 Ca) MG chewable tablet Chew by mouth 3 (three) times daily.     dapagliflozin  propanediol (FARXIGA ) 10 MG TABS tablet TAKE ONE TABLET BY MOUTH EVERY DAY 30 tablet 10   desvenlafaxine  (PRISTIQ ) 50 MG 24 hr tablet Take 50 mg by mouth daily.     dexamethasone  (DECADRON ) 4 MG tablet TAKE ONE TABLET BY MOUTH 2 TIMES A DAY FOR 5 DAYS AFTER CHEMOTHERAPY 20 tablet 2   diphenoxylate -atropine  (LOMOTIL ) 2.5-0.025 MG tablet TAKE TWO TABLETS BY MOUTH FOUR TIMES A DAY AS NEEDED FOR DIARRHEA OR LOOSE STOOLS 100 tablet 0   folic acid  (FOLVITE ) 1 MG tablet Take 1 tablet (1 mg total) by mouth daily. 30 tablet 5   labetalol  (NORMODYNE ) 100 MG tablet Take 1 tablet  (100 mg total) by mouth 2 (two) times daily. 180 tablet 3   loratadine  (CLARITIN ) 10 MG tablet TAKE ONE TABLET BY MOUTH EVERY DAY 30 tablet 5   LORazepam  (ATIVAN ) 1 MG tablet Take 1 tablet (1 mg total) by mouth every 6 (six) hours as needed for anxiety (nausea). 60 tablet 0   magic mouthwash (lidocaine , diphenhydrAMINE , alum & mag hydroxide) suspension Take 5 mLs by mouth every 3 (three) hours as needed. Swish and spit; for throat/mouth pain     naloxone  (NARCAN ) nasal spray 4 mg/0.1 mL One spray in nostril as needed, may repeat every  2 to 3 minutes until medical assistance becomes available (Patient not taking: Reported on 01/27/2024) 4 each 1   nitroGLYCERIN  (NITROSTAT ) 0.4 MG SL tablet Place 0.4 mg under the tongue every 5 (five) minutes as needed for chest pain.     nystatin  (MYCOSTATIN /NYSTOP ) powder Apply 1 Application topically 2 (two) times daily.     OLANZapine  (ZYPREXA ) 5 MG tablet Take 5 mg by mouth at bedtime.     omeprazole  (PRILOSEC) 40 MG capsule Take 1 capsule (40 mg total) by mouth at bedtime. 30 capsule 5   ondansetron  (ZOFRAN ) 8 MG tablet Take 1 tablet (8 mg total) by mouth every 8 (eight) hours as needed for nausea or vomiting. 60 tablet 5   Oxycodone  HCl 10 MG TABS TAKE ONE TABLET BY MOUTH EVERY 4 HOURS AS NEEDED FOR PAIN 180 tablet 0   Potassium 99 MG TABS Take 2 tablets by mouth 2 (two) times daily. GUMMIES     prochlorperazine  (COMPAZINE ) 10 MG tablet TAKE ONE TABLET BY MOUTH EVERY 6 HOURS AS NEEDED FOR NAUSEA/VOMITING 90 tablet 3   sulfamethoxazole -trimethoprim  (BACTRIM  DS) 800-160 MG tablet Take 1 tablet by mouth 2 (two) times daily for 10 days. 20 tablet 0   Vibegron (GEMTESA) 75 MG TABS Take 1 tablet by mouth daily.     Current Facility-Administered Medications  Medication Dose Route Frequency Provider Last Rate Last Admin   0.9 %  sodium chloride  infusion  500 mL Intravenous Once Charlanne Groom, MD       technetium sestamibi generic (CARDIOLITE ) injection 10.5 millicurie   10.5 millicurie Intravenous Once PRN Revankar, Rajan R, MD       Facility-Administered Medications Ordered in Other Visits  Medication Dose Route Frequency Provider Last Rate Last Admin   heparin  lock flush 100 unit/mL  500 Units Intracatheter Once PRN Parsons, Melissa A, NP       sodium chloride  flush (NS) 0.9 % injection 10 mL  10 mL Intracatheter PRN Mosher, Kelli A, PA-C   10 mL at 11/15/23 1111   sodium chloride  flush (NS) 0.9 % injection 10 mL  10 mL Intracatheter PRN Mosher, Kelli A, PA-C       sodium chloride  flush (NS) 0.9 % injection 10 mL  10 mL Intravenous PRN Cornelius Wanda DEL, MD   10 mL at 12/06/23 1109   sodium chloride  flush 0.9 % injection 10 mL  10 mL Intravenous PRN Cornelius Wanda DEL, MD   10 mL at 11/05/23 9089    Allergies  Allergen Reactions   Xgeva  [Denosumab ] Other (See Comments)    Possible ONJ (09/13/23)   Reglan  [Metoclopramide ] Other (See Comments)    Made patient feel really funny when taking this medication     Review of Systems:  neg     Physical Exam:    BP 115/80   Pulse (!) 109   Temp (!) 97.2 F (36.2 C) (Other (Comment)) Comment (Src): forehead  Ht 4' 8.5 (1.435 m)   Wt 129 lb (58.5 kg)   LMP 08/18/2013   SpO2 98%   BMI 28.41 kg/m  Wt Readings from Last 3 Encounters:  03/13/24 129 lb (58.5 kg)  02/21/24 129 lb 8 oz (58.7 kg)  01/27/24 129 lb (58.5 kg)   Constitutional:  Well-developed, in no acute distress. Psychiatric: Normal mood and affect. Behavior is normal. HEENT: Pupils normal.  Conjunctivae are normal. No scleral icterus. Cardiovascular: Normal rate, regular rhythm. No edema Pulmonary/chest: Effort normal and breath sounds normal. No wheezing, rales or rhonchi.  Abdominal: Soft, nondistended. Nontender. Bowel sounds active throughout. There are no masses palpable. No hepatomegaly. Rectal: Deferred Neurological: Alert and oriented to person place and time. Skin: Skin is warm and dry. No rashes noted.  Data Reviewed:  I have personally reviewed following labs and imaging studies  CBC:    Latest Ref Rng & Units 02/21/2024    8:08 AM 01/24/2024    9:39 AM 12/25/2023    8:37 AM  CBC  WBC 4.0 - 10.5 K/uL 6.2  7.0  7.4   Hemoglobin 12.0 - 15.0 g/dL 88.9  88.5  89.2   Hematocrit 36.0 - 46.0 % 35.3  36.9  35.2   Platelets 150 - 400 K/uL 290  321  270     CMP:    Latest Ref Rng & Units 02/21/2024    8:08 AM 01/24/2024    9:39 AM 12/25/2023    8:37 AM  CMP  Glucose 70 - 99 mg/dL 895  864  848   BUN 6 - 20 mg/dL 25  24  12    Creatinine 0.44 - 1.00 mg/dL 9.45  9.43  9.37   Sodium 135 - 145 mmol/L 139  140  140   Potassium 3.5 - 5.1 mmol/L 4.1  4.1  3.5   Chloride 98 - 111 mmol/L 103  104  102   CO2 22 - 32 mmol/L 28  26  27    Calcium  8.9 - 10.3 mg/dL 9.5  9.7  9.1   Total Protein 6.5 - 8.1 g/dL 5.9  6.1  5.8   Total Bilirubin 0.0 - 1.2 mg/dL 0.2  <9.7  <9.7   Alkaline Phos 38 - 126 U/L 94  95  93   AST 15 - 41 U/L 19  14  13    ALT 0 - 44 U/L 16  10  11      GFR: CrCl cannot be calculated (Patient's most recent lab result is older than the maximum 21 days allowed.). Liver Function Tests: No results for input(s): AST, ALT, ALKPHOS, BILITOT, PROT, ALBUMIN in the last 168 hours.     Anselm Bring, MD 03/13/2024, 9:18 AM  Cc: Pia Kerney SQUIBB, MD

## 2024-03-13 NOTE — Progress Notes (Signed)
1018 HR > 100 with esmolol 25 mg given IV, MD updated, vss

## 2024-03-13 NOTE — Telephone Encounter (Signed)
 Pharmacy Patient Advocate Encounter   Received notification from CoverMyMeds that prior authorization for Sucralfate 1GM/10ML suspension is required/requested.   Insurance verification completed.   The patient is insured through [facility] .   Per test claim:  Sucralfate TABLETS is preferred by the insurance.  If suggested medication is appropriate, Please send in a new RX and discontinue this one. If not, please advise as to why it's not appropriate so that we may request a Prior Authorization. Please note, some preferred medications may still require a PA.  If the suggested medications have not been trialed and there are no contraindications to their use, the PA will not be submitted, as it will not be approved.

## 2024-03-13 NOTE — Telephone Encounter (Signed)
 Please go ahead and give tablets instead of elixir RG

## 2024-03-13 NOTE — Progress Notes (Signed)
1006 Robinul 0.1 mg IV given due large amount of secretions upon assessment.  MD made aware, vss

## 2024-03-13 NOTE — Op Note (Signed)
 [facility] Patient Name: Anna [name] Procedure Date: 03/13/2024 10:00 AM MRN: [id number] Endoscopist: Lynnie Bring , MD, [id number] Age: 53 Referring MD:  Date of Birth: 1971/07/19 Gender: Female Account #: [id number] Procedure:                Upper GI endoscopy Indications:              Persistent N/V. Patient with metastatic stage IV                            breast cancer on Enhertu . Medicines:                Monitored Anesthesia Care Procedure:                Pre-Anesthesia Assessment:                           - Prior to the procedure, a History and Physical                            was performed, and patient medications and                            allergies were reviewed. The patient's tolerance of                            previous anesthesia was also reviewed. The risks                            and benefits of the procedure and the sedation                            options and risks were discussed with the patient.                            All questions were answered, and informed consent                            was obtained. Prior Anticoagulants: none ASA Grade                            Assessment: III - A patient with severe systemic                            disease. After reviewing the risks and benefits,                            the patient was deemed in satisfactory condition to                            undergo the procedure.                           After obtaining informed consent, the endoscope was  passed under direct vision. Throughout the                            procedure, the patient's blood pressure, pulse, and                            oxygen saturations were monitored continuously. The                            GIF [id number] #[id number] was introduced through the                            mouth, and advanced to the second part of duodenum.                            The upper GI endoscopy was  accomplished without                            difficulty. The patient tolerated the procedure                            well. Scope In: Scope Out: Findings:                 The esophagus was mildly tortuous. A wide open                            distal esophageal stricture was noted at GE                            junction 32 cm from the incisors with luminal                            diameter approximately 13 mm. Not dilated since                            patient is not having dysphagia. The stenosis was                            traversed.                           Stomach J-shaped with abnormal anatomy. One                            non-bleeding cratered gastric ulcer with no                            stigmata of bleeding was found at the incisura. The                            lesion was 6 mm in largest dimension. Surrounding                            atrophic gastritis. Biopsies  were taken with a cold                            forceps for histology to r/o HP. The pylorus was                            somewhat deformed.                           One non-bleeding cratered duodenal ulcer with no                            stigmata of bleeding was found in the duodenal                            bulb. The lesion was 12 mm in largest dimension. No                            outlet obstruction. The scope could be passed to                            the second portion of the duodenum easily. Multiple                            biopsies were obtained from the second portion of                            the duodenum and sent for histology.                           The exam was otherwise without abnormality. Complications:            No immediate complications. Estimated Blood Loss:     Estimated blood loss: none. Impression:               - Benign-appearing esophageal stenosis.                           - Non-bleeding gastric ulcer with no stigmata of                             bleeding. Biopsied.                           - Non-bleeding duodenal ulcer with no stigmata of                            bleeding.                           - The examination was otherwise normal. Recommendation:           - Patient has a contact number available for                            emergencies. The signs and symptoms of  potential                            delayed complications were discussed with the                            patient. Return to normal activities tomorrow.                            Written discharge instructions were provided to the                            patient.                           - Resume previous diet.                           - Increase omeprazole  40 mg p.o. twice daily                           - Add Carafate elixir 1 g p.o. 4 times daily x 2                            weeks                           - Check CBC, CMP today.                           - No ibuprofen, naproxen, or other non-steroidal                            anti-inflammatory drugs.                           - Unfortunately, Enhertu  can cause gastric and                            duodenal ulcers. I will send copy to Dr. Wanda Cornish.                           - Await pathology results.                           - The findings and recommendations were discussed                            with the patient's family. Lynnie Bring, MD 03/13/2024 10:36:19 AM This report has been signed electronically.

## 2024-03-13 NOTE — Patient Instructions (Addendum)
 Resume previous diet Increase omeprazole  40mg  twice daily  Add Carafate elixir 1 gram 4 times daily X2 weeks No ibuprofen, naproxen, or other non-steroidal anti-inflammatory drugs Await pathology results  YOU HAD AN ENDOSCOPIC PROCEDURE TODAY AT THE [facility]:   Refer to the procedure report that was given to you for any specific questions about what was found during the examination.  If the procedure report does not answer your questions, please call your gastroenterologist to clarify.  If you requested that your care partner not be given the details of your procedure findings, then the procedure report has been included in a sealed envelope for you to review at your convenience later.  YOU SHOULD EXPECT: Some feelings of bloating in the abdomen. Passage of more gas than usual.  Walking can help get rid of the air that was put into your GI tract during the procedure and reduce the bloating. If you had a lower endoscopy (such as a colonoscopy or flexible sigmoidoscopy) you may notice spotting of blood in your stool or on the toilet paper. If you underwent a bowel prep for your procedure, you may not have a normal bowel movement for a few days.  Please Note:  You might notice some irritation and congestion in your nose or some drainage.  This is from the oxygen used during your procedure.  There is no need for concern and it should clear up in a day or so.  SYMPTOMS TO REPORT IMMEDIATELY: Following upper endoscopy (EGD)  Vomiting of blood or coffee ground material  New chest pain or pain under the shoulder blades  Painful or persistently difficult swallowing  New shortness of breath  Fever of 100F or higher  Black, tarry-looking stools  For urgent or emergent issues, a gastroenterologist can be reached at any hour by calling [phone]. Do not use MyChart messaging for urgent concerns.   DIET:  We do recommend a small meal at first, but then you may proceed to your regular  diet.  Drink plenty of fluids but you should avoid alcoholic beverages for 24 hours.  ACTIVITY:  You should plan to take it easy for the rest of today and you should NOT DRIVE or use heavy machinery until tomorrow (because of the sedation medicines used during the test).    FOLLOW UP: Our staff will call the number listed on your records the next business day following your procedure.  We will call around 7:15- 8:00 am to check on you and address any questions or concerns that you may have regarding the information given to you following your procedure. If we do not reach you, we will leave a message.     If any biopsies were taken you will be contacted by phone or by letter within the next 1-3 weeks.  Please call us  at [phone] if you have not heard about the biopsies in 3 weeks.   SIGNATURES/CONFIDENTIALITY: You and/or your care partner have signed paperwork which will be entered into your electronic medical record.  These signatures attest to the fact that that the information above on your After Visit Summary has been reviewed and is understood.  Full responsibility of the confidentiality of this discharge information lies with you and/or your care-partner.

## 2024-03-16 ENCOUNTER — Telehealth: Payer: Self-pay

## 2024-03-16 ENCOUNTER — Other Ambulatory Visit: Payer: Self-pay

## 2024-03-16 DIAGNOSIS — R112 Nausea with vomiting, unspecified: Secondary | ICD-10-CM

## 2024-03-16 MED ORDER — SUCRALFATE 1 G PO TABS: 1.0000 g | ORAL_TABLET | Freq: Three times a day (TID) | ORAL | 0 refills | Status: DC

## 2024-03-16 NOTE — Telephone Encounter (Signed)
  Follow up Call-     03/13/2024    9:16 AM 05/29/2022    8:30 AM  Call back number  Post procedure Call Back phone  # [phone]  Permission to leave phone message Yes      Patient questions:  Do you have a fever, pain , or abdominal swelling? No. Pain Score  0 *  Have you tolerated food without any problems? Yes.    Have you been able to return to your normal activities? Yes.    Do you have any questions about your discharge instructions: Diet   No. Medications  No. Follow up visit  No.  Do you have questions or concerns about your Care? No.  Actions: * If pain score is 4 or above: No action needed, pain <4.

## 2024-03-16 NOTE — Telephone Encounter (Signed)
 Please see note below.

## 2024-03-17 ENCOUNTER — Other Ambulatory Visit: Payer: Self-pay | Admitting: Hematology and Oncology

## 2024-03-17 DIAGNOSIS — G8929 Other chronic pain: Secondary | ICD-10-CM

## 2024-03-17 DIAGNOSIS — M545 Low back pain, unspecified: Secondary | ICD-10-CM

## 2024-03-18 LAB — SURGICAL PATHOLOGY

## 2024-03-19 NOTE — Progress Notes (Signed)
 [facility]  [address] [place],  [place]  [address] [phone] Clinic Day: 03/20/24  Referring physician: Pia Kerney SQUIBB, MD  ASSESSMENT & PLAN:  Assessment: Malignant neoplasm metastatic to bone [facility]) Bone metastases diagnosed in November 2022, with multiple marrow replacing lesions within the proximal right femur within the ischium as well as the inferior rami. 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 Ki67 was 60%. PET imaging in December revealed dominant finding of intensely hypermetabolic aggressive skeletal metastasis involving the calvarium, sternum, thoracic and lumbar spine, and pelvis, with potential pathologic fracture of the right femur with internal fixation. There was soft tissue extension into the anterior mediastinum associated with the manubrial metastasis. Multiple spinal vertebral body lesions had cortical destruction along the posterior margin. She started monthly Xgeva  in December, and completed radiation therapy. She continued with Herceptin /Perjeta  until she had progression of disease. In January 2024, she developed increased pain of the left hip. She was found to have innumerable metastatic lesions throughout the pelvis and proximal femur. There is a non displaced pathologic fracture through a large lesion in the superior left acetabulum. She was referred to an orthopedic oncologist at [facility] and they recommended radiation and no surgery. She received her palliative radiation.    We then switched her to Enhertu  chemotherapy in March of 2024, but she has had significant toxicities of nausea, vomiting, diarrhea, and hypokalemia.  She was also placed on hormonal therapy with Faslodex  injections.  Repeat CT imaging in July revealed decreased soft tissue thickening around the left subclavian structures and thoracic inlet.  Left lateral breast mass was stable.   Widespread mixed lytic and sclerotic osseous metastatic lesions were stable.  There was unchanged severe elevation of the left hemidiaphragm. CT neck in October to evaluate for possible soft tissue mass and painful swallowing did not reveal any soft tissue masses.  The extensive mixed lytic and sclerotic skeletal metastases were much more pronounced in the visible upper thorax than in the cervical spine and were felt to be grossly stable from July 2024. CT chest, abdomen and pelvis in November revealed stable bony disease without evidence of new/progressive disease.  Due to the development of osteonecrosis of the jaw, denosumab  was discontinued 2025   She completed 18 cycles of Enhertu , which was then placed on hold due to severe anemia.  She required multiple units of blood. She was found to be iron  deficient and received IV iron  in the form of Venofer  for 5 doses.  We resumed Enhertu  in April.  She has continued fulvestrant  monthly.  In May, patient requested she receive Enhertu  every 4 weeks.   She was referred to Dr. Charlanne to consider evaluation of the iron  deficiency.  He will hold off on a colonoscopy. He performed an EGD 03/13/2024 which revealed both gastric and duodenal ulcers as well as esophageal stricture.   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  She was treated with lumpectomy followed by adjuvant chemotherapy and radiation therapy. She was on adjuvant hormonal therapy with tamoxifen 20 mg daily from October 2015 to May 2020, but due to elevation of the liver transaminases was switched to anastrazole.  She then developed bone metastasis in November 2022 so has been receiving chemotherapy. She had skin thickening on her last mammogram, but a nodule in  the left breast was stable on CT imaging. The skin thickening is felt to possibly due to radiation, as her exam remained  stable. Continued mammograms are not recommended.   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 She developed pain of the right jaw in February 2025.  Panorex x-rays revealed an abnormal area in the right lower jaw consistent with osteonecrosis.  Xgeva  was discontinued.  She was treated with Augmentin  in February.  She has been working closely with her dentist. She has had 2 fillings done, but also may need root canals.  She sees him again on April 8.  She had swelling and erythema of the left jaw earlier this month, so was placed on amoxicillin  4 times daily, with resolution of the swelling and erythema.  The redness and swelling have resolved, but she states she still has some mild tenderness of the anterior left jaw.  She has had recurrent swelling and erythema and requires intermittent courses of Amoxicillin . We will treat her again now with Augmentin .    Iron  deficiency anemia New iron  deficiency anemia diagnosed in March of 2025, when she presented with acute worsening of her anemia. She required transfusion of 3 units of PRBC's. She then received IV iron  in the form of Venofer  for 5 doses. She is taking oral iron  now.   Her hemoglobin has slightly improved, which may be due to hemoconcentration.  She was referred to Dr. Charlanne to consider evaluation of the iron  deficiency. He will hold off on a colonoscopy. He performed an EGD 03/13/2024 which revealed both gastric and duodenal ulcers as well as esophageal stricture. He did recommend increasing the omeprazole  to 40 mg BID and added Carafate .    Plan: She continues Enhertu  and Faslodex  injections every 4 weeks. Her usual chronic pain has been controlled with oxycodone  10 mg Q4hrs prn but she complains of constipation, and dysuria. I will order a urine culture and urinalysis. The blisters on her right leg are now developing on the left, she informed me that [facility]. She did  meet with Dr. Charlanne in June who decided not to do a colonoscopy, but did schedule a endoscopy. This revealed both gastric and duodenal ulcers as well as esophageal stricture. He did recommend increasing the omeprazole  to 40 mg BID and added Carafate . Her day 1 cycle 22 of Enhertu  is scheduled on 02/21/2024. She has a WBC of 6.2, low hemoglobin of 11.0 down from 11.4, and platelet count of 290,0000. Her CMP is normal other than an elevated BUN of 25 and low total protein of 5.9. Her BP was also low today at 95/73 and considering her BUN level I will add 1L of normal saline. I recommended she increase protein in her diet. I will see her back in 4 weeks with CBC and CMP. The patient and her husband understand the plans discussed today and are in agreement with them.  She knows to contact our office if she develops concerns prior to her next appointment.  I provided 28 minutes of face-to-face time during this encounter and > 50% was spent counseling as documented under my assessment and plan.   Wanda VEAR Cornish, MD  [facility] OF [facility] [address] [place] [address] Dept: [phone] Dept Fax: [phone]   No orders of the defined types were placed in this encounter.  CHIEF COMPLAINT:  CC: Metastatic hormone and HER2 receptor positive breast cancer  Current Treatment: Enhertu  every 4 weeks  HISTORY OF PRESENT ILLNESS:   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 (HCC)   07/23/2013 Cancer Staging   Staging form: Breast, AJCC 7th Edition - Clinical stage from 07/23/2013: Stage IIB (T2, N1, M0) - Signed by Cornelius Wanda DEL, MD on 07/04/2020 Prognostic indicators: Pos LVI   09/22/2021 - 04/06/2022 Chemotherapy   Patient is on Treatment Plan : BREAST  Trastuzumab  +  Pertuzumab  q21d      09/22/2021 - 09/21/2022 Chemotherapy   Patient is on Treatment Plan : BREAST Trastuzumab  + Pertuzumab  q21d     10/12/2022 -  Chemotherapy   Patient is on Treatment Plan : BREAST METASTATIC Fam-Trastuzumab Deruxtecan-nxki  (Enhertu ) (5.4) q28d (changed from q21d on 12/25/23)     Malignant neoplasm metastatic to bone (HCC)  06/28/2021 Initial Diagnosis   Bone metastases (HCC)   09/22/2021 - 04/06/2022 Chemotherapy   Patient is on Treatment Plan : BREAST  Trastuzumab  + Pertuzumab  q21d      09/22/2021 - 09/21/2022 Chemotherapy   Patient is on Treatment Plan : BREAST Trastuzumab  + Pertuzumab  q21d     05/11/2022 Imaging   CT chest, abdomen and pelvis:  IMPRESSION:  1. Widespread bony metastatic disease shows increased sclerosis  since previous imaging. This is likely related to response to  therapy in the interval. There is a single lesion along the LEFT  iliac which shows continued lytic change and warrants attention on  follow-up  2. Interval development of pathologic fractures at in the upper  thoracic spine and at the thoracolumbar junction with associated  kyphosis.  3. No signs of solid organ or nodal metastatic disease.  4. Marked elevation of the LEFT hemidiaphragm as on the prior PET.  5. Aortic atherosclerosis.    10/12/2022 -  Chemotherapy   Patient is on Treatment Plan : BREAST METASTATIC Fam-Trastuzumab Deruxtecan-nxki  (Enhertu ) (5.4) q28d (changed from q21d on 12/25/23)       INTERVAL HISTORY:  Jamila is here today for repeat clinical assessment for her metastatic hormone and HER2 receptor positive breast cancer. Patient states that she feels well and complains of lower back pain rated 4/10. She visited the dentist, where she was diagnosed with a tooth infection. She was prescribed Augmentin , but has not yet started this treatment. I encouraged her to do so since it should also help her ONJ. She had an upper GI endoscopy performed on 03/13/2024 by Dr. Charlanne which  revealed an esophageal stricture, one 6 mm non-bleeding cratered gastric ulcer with surrounding atrophic gastritis, and one 12 mm non-bleeding cratered duodenal ulcer . I explained these results to the patient and her husband and they understand. Dr. Charlanne recommended she increase her 40 mg omeprazole  to BID and added Carafate  1 gram four times daily for the ulcers and I agree with this. I reminded her to take Carafate  separately from her other medications as it coats the stomach lining and can prevent absorption. Based on her past CBC results, I suspect that she had a significant amount of bleeding from the ulcers at the beginning of the year that has since discontinued. She is currently taking 1 Aspirin a day. I cautioned her to avoid NSAIDs as these can cause gastritis and ulcers. I recommended she continue oxycodone  for her bone pain. She continues to  attend the wound clinic to care for the blisters on her legs and they are wrapped at present. Her day 1, cycle 23 of Enhertu  is scheduled for today, 03/20/2024. She has a WBC of 5.7, a low hemoglobin of 10.1 down from 11.0, and platelet count of 304,000. Her CMP is normal other than a stable and elevated BUN of 25 and a low stable total protein of 5.8. I recommend increasing oral protein and fluid intake. I let her know to call the office if she begins to have concerning symptoms and we will be glad to see her sooner. Her last echocardiogram was performed on 12/10/2023 so I will schedule  an echocardiogram for 03/30/2024. We will see her back in 4 weeks with CBC, CMP, iron , TIBC, and ferritin for her next cycle of Enhertu . She denies fever, chills, night sweats, or other signs of infection. She denies cardiorespiratory and gastrointestinal issues. She  denies pain. Her appetite is good and Her weight has been stable at 129lb 9.6oz.  REVIEW OF SYSTEMS:   Review of Systems  Constitutional:  Positive for fatigue. Negative for appetite change, chills, fever and  unexpected weight change.  HENT:   Negative for lump/mass, mouth sores, sore throat and trouble swallowing.        Jaw swelling and erythema likely related to her ONJ  Respiratory:  Negative for cough and shortness of breath.   Cardiovascular:  Negative for chest pain and leg swelling.  Gastrointestinal:  Positive for constipation. Negative for abdominal pain, diarrhea and vomiting. Nausea: some. Genitourinary:  Negative for bladder incontinence, difficulty urinating, dysuria, frequency and hematuria.   Musculoskeletal:  Positive for back pain (Lower back 4/10) and neck pain. Negative for arthralgias, gait problem and myalgias.  Skin:  Positive for wound (Bilateral leg ulcers, newly on the left now healing). Negative for rash.  Neurological:  Negative for dizziness, extremity weakness, gait problem, headaches, light-headedness and numbness.  Hematological:  Negative for adenopathy. Does not bruise/bleed easily.  Psychiatric/Behavioral:  Negative for depression and sleep disturbance. The patient is not nervous/anxious.     VITALS:   Blood pressure 101/81, pulse 94, temperature 98.5 F (36.9 C), temperature source Oral, resp. rate 18, height 4' 8.5 (1.435 m), weight 129 lb 9.6 oz (58.8 kg), last menstrual period 08/18/2013, SpO2 96%.  Wt Readings from Last 3 Encounters:  03/20/24 129 lb 9.6 oz (58.8 kg)  03/13/24 129 lb (58.5 kg)  02/21/24 129 lb 8 oz (58.7 kg)    Body mass index is 28.54 kg/m.  Performance status (ECOG): 1 - Symptomatic but completely ambulatory    PHYSICAL EXAM:   Physical Exam Vitals and nursing note reviewed.  Constitutional:      General: She is not in acute distress.    Appearance: She is normal weight. She is not ill-appearing.  HENT:     Head: Normocephalic and atraumatic.     Comments: She has some firm swelling in the submental area    Right Ear: Tympanic membrane, ear canal and external ear normal.     Left Ear: Tympanic membrane, ear canal and  external ear normal.     Nose: Nose normal.     Mouth/Throat:     Mouth: Mucous membranes are moist.     Pharynx: Oropharynx is clear. No oropharyngeal exudate or posterior oropharyngeal erythema.  Eyes:     General: No scleral icterus.    Extraocular Movements: Extraocular movements intact.     Conjunctiva/sclera: Conjunctivae normal.  Pupils: Pupils are equal, round, and reactive to light.  Cardiovascular:     Rate and Rhythm: Normal rate and regular rhythm.     Pulses: Normal pulses.     Heart sounds: Normal heart sounds. No murmur heard.    No friction rub. No gallop.  Pulmonary:     Effort: Pulmonary effort is normal.     Breath sounds: Normal breath sounds. No wheezing, rhonchi or rales.  Chest:     Comments: Breast exam is deferred Abdominal:     General: Bowel sounds are normal. There is no distension.     Palpations: Abdomen is soft. There is no hepatomegaly, splenomegaly or mass.     Tenderness: There is no abdominal tenderness.  Musculoskeletal:        General: Normal range of motion.     Cervical back: Normal range of motion and neck supple. No tenderness.     Right lower leg: Edema (mild) present.     Left lower leg: Edema (mild) present.  Lymphadenopathy:     Cervical: No cervical adenopathy.     Upper Body:     Right upper body: No supraclavicular or axillary adenopathy.     Left upper body: No supraclavicular or axillary adenopathy.     Lower Body: No right inguinal adenopathy. No left inguinal adenopathy.  Skin:    General: Skin is warm and dry.     Coloration: Skin is not jaundiced.     Findings: No rash.  Neurological:     General: No focal deficit present.     Mental Status: She is alert and oriented to person, place, and time. Mental status is at baseline.     Cranial Nerves: No cranial nerve deficit.  Psychiatric:        Mood and Affect: Mood normal.        Behavior: Behavior normal.        Thought Content: Thought content normal.         Judgment: Judgment normal.     LABS:      Latest Ref Rng & Units 03/20/2024    9:26 AM 02/21/2024    8:08 AM 01/24/2024    9:39 AM  CBC  WBC 4.0 - 10.5 K/uL 5.7  6.2  7.0   Hemoglobin 12.0 - 15.0 g/dL 89.8  88.9  88.5   Hematocrit 36.0 - 46.0 % 33.4  35.3  36.9   Platelets 150 - 400 K/uL 304  290  321       Latest Ref Rng & Units 03/20/2024    9:26 AM 02/21/2024    8:08 AM 01/24/2024    9:39 AM  CMP  Glucose 70 - 99 mg/dL 865  895  864   BUN 6 - 20 mg/dL 25  25  24    Creatinine 0.44 - 1.00 mg/dL 9.44  9.45  9.43   Sodium 135 - 145 mmol/L 140  139  140   Potassium 3.5 - 5.1 mmol/L 3.8  4.1  4.1   Chloride 98 - 111 mmol/L 103  103  104   CO2 22 - 32 mmol/L 27  28  26    Calcium  8.9 - 10.3 mg/dL 9.6  9.5  9.7   Total Protein 6.5 - 8.1 g/dL 5.8  5.9  6.1   Total Bilirubin 0.0 - 1.2 mg/dL <9.7  0.2  <9.7   Alkaline Phos 38 - 126 U/L 79  94  95   AST 15 - 41 U/L 19  19  14   ALT 0 - 44 U/L 7  16  10     Lab Results  Component Value Date   CEA1 2.2 07/20/2021   /  CEA  Date Value Ref Range Status  07/20/2021 2.2 0.0 - 4.7 ng/mL Final    Comment:    (NOTE)                             Nonsmokers          <3.9                             Smokers             <5.6 [employer] Electrochemiluminescence Immunoassay (ECLIA) Values obtained with different assay methods or kits cannot be used interchangeably.  Results cannot be interpreted as absolute evidence of the presence or absence of malignant disease. Performed At: [facility] [address] [place], [place] [id number] Jennette Shorter MD Ey:[phone]     Lab Results  Component Value Date   TIBC 269 01/24/2024   TIBC 251 10/25/2023   TIBC 225 (L) 07/10/2023   FERRITIN 326 (H) 01/24/2024   FERRITIN 129 10/25/2023   FERRITIN 305 07/10/2023   IRONPCTSAT 13 01/24/2024   IRONPCTSAT 8 (L) 10/25/2023   IRONPCTSAT 11 07/10/2023   Lab Results  Component Value Date   LDH 143 10/25/2023   LDH 171 07/10/2023     STUDIES:   ECHOCARDIOGRAM COMPLETE Result Date: 03/26/2024    ECHOCARDIOGRAM REPORT   Patient Name:   Dannya P Klimaszewski Date of Exam: 03/25/2024 Medical Rec #:  [id number]           Height:       56.5 in Accession #:    [id number]          Weight:       129.6 lb Date of Birth:  1970-10-06          BSA:          1.486 m Patient Age:    52 years            BP:           107/61 mmHg Patient Gender: F                   HR:           114 bpm. Exam Location:  [facility] Procedure: 2D Echo, Cardiac Doppler, Color Doppler and Strain Analysis (Both            Spectral and Color Flow Doppler were utilized during procedure). Indications:    Malignant neoplasm metastatic to bone [facility]) [C79.51 (ICD-10-CM)]  History:        Patient has prior history of Echocardiogram examinations, most                 recent 12/12/2023. Signs/Symptoms:Murmur and Chest Pain; Risk                 Factors:Hypertension, Dyslipidemia and Diabetes.  Sonographer:    Charlie Jointer RDCS Referring Phys: [id number] Nikky Duba H Bryndle Corredor  Sonographer Comments: Technically difficult study due to poor echo windows. IMPRESSIONS  1. Left ventricular ejection fraction, by estimation, is 60 to 65%. The left ventricle has normal function. The left ventricle has no regional wall motion abnormalities. Left ventricular diastolic parameters are consistent with Grade I diastolic  dysfunction (impaired relaxation). The average left ventricular global longitudinal strain is -14.4 %. The global longitudinal strain is abnormal.  2. Right ventricular systolic function is normal. The right ventricular size is normal.  3. The mitral valve is normal in structure. No evidence of mitral valve regurgitation. No evidence of mitral stenosis.  4. The aortic valve is normal in structure. Aortic valve regurgitation is not visualized. No aortic stenosis is present.  5. The inferior vena cava is normal in size with greater than 50% respiratory variability, suggesting right atrial  pressure of 3 mmHg. FINDINGS  Left Ventricle: Left ventricular ejection fraction, by estimation, is 60 to 65%. The left ventricle has normal function. The left ventricle has no regional wall motion abnormalities. The average left ventricular global longitudinal strain is -14.4 %. Strain was performed and the global longitudinal strain is abnormal. The left ventricular internal cavity size was normal in size. There is no left ventricular hypertrophy. Left ventricular diastolic parameters are consistent with Grade I diastolic dysfunction (impaired relaxation). Right Ventricle: The right ventricular size is normal. No increase in right ventricular wall thickness. Right ventricular systolic function is normal. Left Atrium: Left atrial size was normal in size. Right Atrium: Right atrial size was normal in size. Pericardium: There is no evidence of pericardial effusion. Mitral Valve: The mitral valve is normal in structure. No evidence of mitral valve regurgitation. No evidence of mitral valve stenosis. Tricuspid Valve: The tricuspid valve is normal in structure. Tricuspid valve regurgitation is not demonstrated. No evidence of tricuspid stenosis. Aortic Valve: The aortic valve is normal in structure. Aortic valve regurgitation is not visualized. No aortic stenosis is present. Pulmonic Valve: The pulmonic valve was normal in structure. Pulmonic valve regurgitation is not visualized. No evidence of pulmonic stenosis. Aorta: The aortic root is normal in size and structure. Venous: The inferior vena cava is normal in size with greater than 50% respiratory variability, suggesting right atrial pressure of 3 mmHg. IAS/Shunts: No atrial level shunt detected by color flow Doppler.  LEFT VENTRICLE PLAX 2D LVIDd:         3.30 cm   Diastology LVIDs:         2.20 cm   LV e' medial:    8.59 cm/s LV PW:         1.00 cm   LV E/e' medial:  11.6 LV IVS:        1.00 cm   LV e' lateral:   8.85 cm/s LVOT diam:     2.00 cm   LV E/e' lateral:  11.2 LV SV:         40 LV SV Index:   27        2D Longitudinal Strain LVOT Area:     3.14 cm  2D Strain GLS Avg:     -14.4 %  RIGHT VENTRICLE             IVC RV S prime:     12.30 cm/s  IVC diam: 1.90 cm TAPSE (M-mode): 2.0 cm LEFT ATRIUM         Index LA diam:    2.00 cm 1.35 cm/m  AORTIC VALVE LVOT Vmax:   89.50 cm/s LVOT Vmean:  57.733 cm/s LVOT VTI:    0.127 m  AORTA Ao Root diam: 2.60 cm Ao Asc diam:  2.80 cm Ao Desc diam: 2.30 cm MITRAL VALVE MV Area (PHT): 5.97 cm    SHUNTS MV Decel Time: 127 msec    Systemic VTI:  0.13 m MV E velocity: 99.30 cm/s  Systemic Diam: 2.00 cm MV A velocity: 96.90 cm/s MV E/A ratio:  1.02 Jennifer Crape MD Electronically signed by Jennifer Crape MD Signature Date/Time: 03/26/2024/12:19:23 PM    Final      EXAM: 09/05/2023 CT CHEST, ABDOMEN, AND PELVIS WITH CONTRAST IMPRESSION: 1. Unchanged size of the mass in the left breast with overlying skin thickening. 2. No significant interval change in the widespread osseous metastatic disease involving the axial and proximal appendicular skeleton. 3. Similar soft tissue thickening about the left subclavian structures and thoracic inlet, possibly reflecting posttreatment change. Suggest continued attention on follow-up imaging. 4. No evidence of new or progressive disease within the chest, abdomen or pelvis. 5.  Aortic Atherosclerosis (ICD10-I70.0).  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   Anxiety    Bipolar disorder (HCC) 11/29/2020   Blood transfusion without reported diagnosis    Breast cancer (HCC)    Cancer (HCC)    Cardiac murmur    Chest pain    Chronic fatigue syndrome 11/29/2020   Depression    Essential hypertension 11/29/2020   Hepatic steatosis determined by biopsy of liver  07/04/2020   HLD (hyperlipidemia)    Hypertension    Hypomagnesemia 03/26/2023   Hypothyroidism 11/29/2020   Insomnia 11/29/2020   Iron  deficiency anemia 06/05/2022   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   Migraine 11/29/2020   Mild intermittent asthma 11/29/2020   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   Mixed hyperlipidemia 11/29/2020   Obesity due to excess calories    Osteopenia after menopause 07/04/2020   Other long term (current) drug therapy 11/29/2020   Other vitamin B12 deficiency anemias 11/29/2020   Personal history of malignant neoplasm of breast 11/29/2020   Pre-diabetes    Renal cyst, acquired, right 07/04/2020   14.5 cm in October 2021   Retrocalcaneal bursitis (back of heel), left 04/22/2018   Seasonal allergic rhinitis 11/29/2020   Tightness of heel cord, left 04/22/2018   Tremor 08/02/2023   Type 2 diabetes mellitus without complications (HCC) 11/29/2020   Vitamin D  deficiency 11/29/2020    Past Surgical History:  Procedure Laterality Date   CESAREAN SECTION     COLONOSCOPY     IR BONE TUMOR(S)RF ABLATION  05/29/2022   IR BONE TUMOR(S)RF ABLATION  05/29/2022   IR KYPHO EA ADDL LEVEL THORACIC OR LUMBAR  05/29/2022   IR KYPHO THORACIC WITH BONE BIOPSY  05/29/2022   IR RADIOLOGIST EVAL & MGMT  05/22/2022   IR RADIOLOGIST EVAL & MGMT  06/12/2022   LIVER BIOPSY  01/2019   lumpectomy  2014   Left Breast   PORT-A-CATH REMOVAL     PORTACATH PLACEMENT  08/28/2013   STOMACH SURGERY     Tummy Tuck   TOTAL HIP ARTHROPLASTY 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 Her smoking use included cigarettes. She has never used smokeless tobacco. She reports that she does not currently use alcohol. She reports that she does not use drugs.The patient is accompanied by her husband today.  Allergies:  Allergies  Allergen Reactions   Xgeva  [Denosumab ] Other (See Comments)    Possible ONJ (09/13/23)   Reglan  [Metoclopramide ] Other (See Comments)    Made patient feel really funny when taking this medication     Current Medications: Current Outpatient Medications  Medication Sig Dispense Refill   amoxicillin -clavulanate (AUGMENTIN ) 875-125 MG tablet Take 1 tablet by mouth 2 (two) times daily.     omeprazole  (PRILOSEC) 40 MG capsule Take 1 capsule (40 mg total) by mouth at bedtime. (Patient taking differently: Take 40 mg by mouth in the morning and at bedtime.) 30 capsule 5   albuterol  (VENTOLIN  HFA) 108 (90 Base) MCG/ACT inhaler Inhale 2 puffs into the lungs every 6 (six) hours as needed for shortness of breath.     ARIPiprazole  (ABILIFY ) 5 MG tablet Take 2.5 mg by mouth daily.     aspirin 81 MG EC tablet Take 81 mg by mouth daily. Swallow whole.     atorvastatin  (LIPITOR) 20 MG tablet Take 1 tablet (20 mg total) by mouth daily. 90 tablet 3   augmented betamethasone dipropionate (DIPROLENE-AF) 0.05 % cream  (Patient taking differently: 1 Application as needed.)     calcium  carbonate (OS-CAL) 1250 (500 Ca) MG chewable tablet Chew by mouth 3 (three) times daily.     dapagliflozin  propanediol (FARXIGA ) 10 MG TABS tablet TAKE ONE TABLET BY MOUTH EVERY DAY 30 tablet 10   desvenlafaxine  (PRISTIQ ) 50 MG 24 hr tablet Take 50 mg by mouth daily.     dexamethasone  (DECADRON ) 4 MG tablet TAKE ONE TABLET BY MOUTH 2 TIMES A DAY FOR 5 DAYS AFTER CHEMOTHERAPY 20 tablet 2   diphenoxylate -atropine  (LOMOTIL ) 2.5-0.025 MG tablet TAKE TWO TABLETS BY MOUTH FOUR TIMES A DAY AS NEEDED FOR  DIARRHEA OR LOOSE STOOLS 100 tablet 0   folic acid  (FOLVITE ) 1 MG tablet Take 1 tablet (1 mg total) by mouth daily. 30 tablet 5   labetalol  (NORMODYNE ) 100 MG tablet Take 1 tablet (100 mg total) by mouth 2 (two) times daily. 180 tablet 3   loratadine  (CLARITIN ) 10 MG tablet TAKE ONE TABLET BY MOUTH EVERY DAY 30 tablet 5   LORazepam  (ATIVAN ) 1 MG tablet Take 1 tablet (1 mg total) by mouth every 6 (six) hours as needed for anxiety (nausea). 60 tablet 0   magic mouthwash (lidocaine , diphenhydrAMINE , alum & mag hydroxide) suspension Take 5 mLs by mouth every 3 (three) hours as needed. Swish and spit; for throat/mouth pain     naloxone  (NARCAN ) nasal spray 4 mg/0.1 mL One spray in nostril as needed, may repeat every 2 to 3 minutes until medical assistance becomes available (Patient not taking: No sig reported) 4 each 1   nitroGLYCERIN  (NITROSTAT ) 0.4 MG SL tablet Place 0.4 mg under the tongue every 5 (five) minutes as needed for chest pain.     nystatin  (MYCOSTATIN /NYSTOP ) powder Apply 1 Application topically 2 (two) times daily.     OLANZapine  (ZYPREXA ) 5 MG tablet Take 5 mg by mouth at bedtime.     ondansetron  (ZOFRAN ) 8 MG tablet Take 1 tablet (8 mg total) by mouth every 8 (eight) hours  as needed for nausea or vomiting. 60 tablet 5   Oxycodone  HCl 10 MG TABS TAKE ONE TABLET BY MOUTH EVERY 4 HOURS AS NEEDED FOR PAIN 180 tablet 0   Potassium 99 MG TABS Take 2 tablets by mouth 2 (two) times daily. GUMMIES     prochlorperazine  (COMPAZINE ) 10 MG tablet TAKE ONE TABLET BY MOUTH EVERY 6 HOURS AS NEEDED FOR NAUSEA/VOMITING 90 tablet 3   sucralfate  (CARAFATE ) 1 g tablet Take 1 tablet (1 g total) by mouth 4 (four) times daily -  with meals and at bedtime for 14 days. 56 tablet 0   Vibegron (GEMTESA) 75 MG TABS Take 1 tablet by mouth daily.     Current Facility-Administered Medications  Medication Dose Route Frequency Provider Last Rate Last Admin   technetium sestamibi generic (CARDIOLITE ) injection 10.5  millicurie  10.5 millicurie Intravenous Once PRN Revankar, Rajan R, MD       Facility-Administered Medications Ordered in Other Visits  Medication Dose Route Frequency Provider Last Rate Last Admin   heparin  lock flush 100 unit/mL  500 Units Intracatheter Once PRN Harl Setter A, NP       sodium chloride  flush (NS) 0.9 % injection 10 mL  10 mL Intracatheter PRN Mosher, Kelli A, PA-C   10 mL at 11/15/23 1111   sodium chloride  flush (NS) 0.9 % injection 10 mL  10 mL Intracatheter PRN Mosher, Kelli A, PA-C       sodium chloride  flush (NS) 0.9 % injection 10 mL  10 mL Intravenous PRN Cornelius Wanda DEL, MD   10 mL at 12/06/23 1109   sodium chloride  flush 0.9 % injection 10 mL  10 mL Intravenous PRN Cornelius Wanda DEL, MD   10 mL at 11/05/23 9089    I,Tylek Boney H Tobyn Osgood,acting as a scribe for Wanda DEL Cornelius, MD.,have documented all relevant documentation on the behalf of Wanda DEL Cornelius, MD,as directed by  Wanda DEL Cornelius, MD while in the presence of Wanda DEL Cornelius, MD.

## 2024-03-20 ENCOUNTER — Other Ambulatory Visit: Payer: Self-pay | Admitting: Oncology

## 2024-03-20 ENCOUNTER — Telehealth: Payer: Self-pay | Admitting: Oncology

## 2024-03-20 ENCOUNTER — Encounter: Payer: Self-pay | Admitting: Oncology

## 2024-03-20 ENCOUNTER — Inpatient Hospital Stay (HOSPITAL_BASED_OUTPATIENT_CLINIC_OR_DEPARTMENT_OTHER): Admitting: Oncology

## 2024-03-20 ENCOUNTER — Inpatient Hospital Stay

## 2024-03-20 ENCOUNTER — Inpatient Hospital Stay: Attending: Hematology and Oncology

## 2024-03-20 VITALS — BP 101/81 | HR 94 | Temp 98.5°F | Resp 18 | Ht <= 58 in | Wt 129.6 lb

## 2024-03-20 VITALS — BP 107/61 | HR 98 | Temp 98.0°F | Resp 18

## 2024-03-20 DIAGNOSIS — C7951 Secondary malignant neoplasm of bone: Secondary | ICD-10-CM

## 2024-03-20 DIAGNOSIS — C50412 Malignant neoplasm of upper-outer quadrant of left female breast: Secondary | ICD-10-CM | POA: Diagnosis not present

## 2024-03-20 DIAGNOSIS — Z5111 Encounter for antineoplastic chemotherapy: Secondary | ICD-10-CM | POA: Insufficient documentation

## 2024-03-20 DIAGNOSIS — Z170 Estrogen receptor positive status [ER+]: Secondary | ICD-10-CM | POA: Insufficient documentation

## 2024-03-20 DIAGNOSIS — D509 Iron deficiency anemia, unspecified: Secondary | ICD-10-CM | POA: Insufficient documentation

## 2024-03-20 DIAGNOSIS — Z5112 Encounter for antineoplastic immunotherapy: Secondary | ICD-10-CM | POA: Insufficient documentation

## 2024-03-20 DIAGNOSIS — D500 Iron deficiency anemia secondary to blood loss (chronic): Secondary | ICD-10-CM

## 2024-03-20 LAB — CBC WITH DIFFERENTIAL (CANCER CENTER ONLY)
Abs Immature Granulocytes: 0.05 K/uL (ref 0.00–0.07)
Basophils Absolute: 0 K/uL (ref 0.0–0.1)
Basophils Relative: 0 %
Eosinophils Absolute: 0.3 K/uL (ref 0.0–0.5)
Eosinophils Relative: 6 %
HCT: 33.4 % — ABNORMAL LOW (ref 36.0–46.0)
Hemoglobin: 10.1 g/dL — ABNORMAL LOW (ref 12.0–15.0)
Immature Granulocytes: 1 %
Lymphocytes Relative: 14 %
Lymphs Abs: 0.8 K/uL (ref 0.7–4.0)
MCH: 26.1 pg (ref 26.0–34.0)
MCHC: 30.2 g/dL (ref 30.0–36.0)
MCV: 86.3 fL (ref 80.0–100.0)
Monocytes Absolute: 0.6 K/uL (ref 0.1–1.0)
Monocytes Relative: 11 %
Neutro Abs: 3.9 K/uL (ref 1.7–7.7)
Neutrophils Relative %: 68 %
Platelet Count: 304 K/uL (ref 150–400)
RBC: 3.87 MIL/uL (ref 3.87–5.11)
RDW: 19 % — ABNORMAL HIGH (ref 11.5–15.5)
WBC Count: 5.7 K/uL (ref 4.0–10.5)
nRBC: 0 % (ref 0.0–0.2)

## 2024-03-20 LAB — CMP (CANCER CENTER ONLY)
ALT: 7 U/L (ref 0–44)
AST: 19 U/L (ref 15–41)
Albumin: 3.8 g/dL (ref 3.5–5.0)
Alkaline Phosphatase: 79 U/L (ref 38–126)
Anion gap: 10 (ref 5–15)
BUN: 25 mg/dL — ABNORMAL HIGH (ref 6–20)
CO2: 27 mmol/L (ref 22–32)
Calcium: 9.6 mg/dL (ref 8.9–10.3)
Chloride: 103 mmol/L (ref 98–111)
Creatinine: 0.55 mg/dL (ref 0.44–1.00)
GFR, Estimated: >60 mL/min (ref >60)
Glucose, Bld: 134 mg/dL — ABNORMAL HIGH (ref 70–99)
Potassium: 3.8 mmol/L (ref 3.5–5.1)
Sodium: 140 mmol/L (ref 135–145)
Total Bilirubin: <0.2 mg/dL (ref 0.0–1.2)
Total Protein: 5.8 g/dL — ABNORMAL LOW (ref 6.5–8.1)

## 2024-03-20 MED ORDER — DEXAMETHASONE SODIUM PHOSPHATE 10 MG/ML IJ SOLN
10.0000 mg | Freq: Once | INTRAMUSCULAR | Status: AC
  Administered 2024-03-20: 10 mg via INTRAVENOUS
  Filled 2024-03-20: qty 1

## 2024-03-20 MED ORDER — DEXTROSE 5 % IV SOLN: Freq: Once | INTRAVENOUS | Status: AC

## 2024-03-20 MED ORDER — FULVESTRANT 250 MG/5ML IM SOSY
500.0000 mg | PREFILLED_SYRINGE | Freq: Once | INTRAMUSCULAR | Status: AC
  Administered 2024-03-20: 500 mg via INTRAMUSCULAR
  Filled 2024-03-20: qty 10

## 2024-03-20 MED ORDER — DIPHENHYDRAMINE HCL 25 MG PO CAPS
50.0000 mg | ORAL_CAPSULE | Freq: Once | ORAL | Status: DC
  Filled 2024-03-20: qty 2

## 2024-03-20 MED ORDER — SODIUM CHLORIDE 0.9 % IV SOLN
150.0000 mg | Freq: Once | INTRAVENOUS | Status: AC
  Administered 2024-03-20: 150 mg via INTRAVENOUS
  Filled 2024-03-20: qty 150

## 2024-03-20 MED ORDER — ACETAMINOPHEN 325 MG PO TABS
650.0000 mg | ORAL_TABLET | Freq: Once | ORAL | Status: DC
  Filled 2024-03-20: qty 2

## 2024-03-20 MED ORDER — FAM-TRASTUZUMAB DERUXTECAN-NXKI CHEMO 100 MG IV SOLR
5.3000 mg/kg | Freq: Once | INTRAVENOUS | Status: AC
  Administered 2024-03-20: 300 mg via INTRAVENOUS
  Filled 2024-03-20: qty 15

## 2024-03-20 MED ORDER — PALONOSETRON HCL INJECTION 0.25 MG/5ML
0.2500 mg | Freq: Once | INTRAVENOUS | Status: AC
  Administered 2024-03-20: 0.25 mg via INTRAVENOUS
  Filled 2024-03-20: qty 5

## 2024-03-20 NOTE — Telephone Encounter (Signed)
 Patient has been scheduled for follow-up visit per 03/20/24 LOS.  Pt given an appt calendar with date and time.

## 2024-03-20 NOTE — Patient Instructions (Signed)
 Fam-Trastuzumab Deruxtecan Injection What is this medication? FAM-TRASTUZUMAB DERUXTECAN (fam-tras TOOZ eu mab DER ux TEE kan) treats some types of cancer. It works by blocking a protein that causes cancer cells to grow and multiply. This helps to slow or stop the spread of cancer cells. This medicine may be used for other purposes; ask your health care provider or pharmacist if you have questions. COMMON BRAND NAME(S): ENHERTU What should I tell my care team before I take this medication? They need to know if you have any of these conditions: Heart disease Heart failure Infection, especially a viral infection, such as chickenpox, cold sores, or herpes Liver disease Lung or breathing disease, such as asthma or COPD An unusual or allergic reaction to fam-trastuzumab deruxtecan, other medications, foods, dyes, or preservatives Pregnant or trying to get pregnant Breast-feeding How should I use this medication? This medication is injected into a vein. It is given by your care team in a hospital or clinic setting. A special MedGuide will be given to you before each treatment. Be sure to read this information carefully each time. Talk to your care team about the use of this medication in children. Special care may be needed. Overdosage: If you think you have taken too much of this medicine contact a poison control center or emergency room at once. NOTE: This medicine is only for you. Do not share this medicine with others. What if I miss a dose? It is important not to miss your dose. Call your care team if you are unable to keep an appointment. What may interact with this medication? Interactions are not expected. This list may not describe all possible interactions. Give your health care provider a list of all the medicines, herbs, non-prescription drugs, or dietary supplements you use. Also tell them if you smoke, drink alcohol, or use illegal drugs. Some items may interact with your  medicine. What should I watch for while using this medication? Visit your care team for regular checks on your progress. Tell your care team if your symptoms do not start to get better or if they get worse. This medication may increase your risk of getting an infection. Call your care team for advice if you get a fever, chills, sore throat, or other symptoms of a cold or flu. Do not treat yourself. Try to avoid being around people who are sick. Avoid taking medications that contain aspirin, acetaminophen, ibuprofen, naproxen, or ketoprofen unless instructed by your care team. These medications may hide a fever. Be careful brushing or flossing your teeth or using a toothpick because you may get an infection or bleed more easily. If you have any dental work done, tell your dentist you are receiving this medication. This medication may cause dry eyes and blurred vision. If you wear contact lenses, you may feel some discomfort. Lubricating eye drops may help. See your care team if the problem does not go away or is severe. Talk to your care team if you may be pregnant. Serious birth defects can occur if you take this medication during pregnancy and for 7 months after the last dose. If your partner can get pregnant, use a condom during sex while taking this medication and for 4 months after the last dose. Do not breastfeed while taking this medication and for 7 months after the last dose. This medication may cause infertility. Talk to your care team if you are concerned about your fertility. What side effects may I notice from receiving this medication? Side effects  that you should report to your care team as soon as possible: Allergic reactions--skin rash, itching, hives, swelling of the face, lips, tongue, or throat Dry cough, shortness of breath or trouble breathing Infection--fever, chills, cough, sore throat, wounds that don't heal, pain or trouble when passing urine, general feeling of discomfort or  being unwell Heart failure--shortness of breath, swelling of the ankles, feet, or hands, sudden weight gain, unusual weakness or fatigue Unusual bruising or bleeding Side effects that usually do not require medical attention (report these to your care team if they continue or are bothersome): Constipation Diarrhea Hair loss Muscle pain Nausea Vomiting This list may not describe all possible side effects. Call your doctor for medical advice about side effects. You may report side effects to FDA at 1-800-FDA-1088. Where should I keep my medication? This medication is given in a hospital or clinic. It will not be stored at home. NOTE: This sheet is a summary. It may not cover all possible information. If you have questions about this medicine, talk to your doctor, pharmacist, or health care provider.  2024 [employer] (2023-03-29 00:00:00)

## 2024-03-21 ENCOUNTER — Other Ambulatory Visit: Payer: Self-pay

## 2024-03-25 ENCOUNTER — Ambulatory Visit: Attending: Oncology

## 2024-03-25 DIAGNOSIS — Z0189 Encounter for other specified special examinations: Secondary | ICD-10-CM | POA: Diagnosis not present

## 2024-03-25 DIAGNOSIS — C7951 Secondary malignant neoplasm of bone: Secondary | ICD-10-CM | POA: Diagnosis not present

## 2024-03-26 LAB — ECHOCARDIOGRAM COMPLETE
Area-P 1/2: 5.97 cm2
S' Lateral: 2.2 cm

## 2024-03-31 ENCOUNTER — Ambulatory Visit: Payer: Self-pay | Admitting: Gastroenterology

## 2024-04-01 ENCOUNTER — Encounter: Payer: Self-pay | Admitting: Oncology

## 2024-04-01 ENCOUNTER — Telehealth: Payer: Self-pay

## 2024-04-01 NOTE — Telephone Encounter (Signed)
 Attempted to contact patient. No answer and no VM.

## 2024-04-01 NOTE — Telephone Encounter (Signed)
-----   Message from Wanda VEAR Cornish sent at 04/01/2024  7:35 AM EDT ----- Regarding: call Tell her heart test looks good

## 2024-04-02 ENCOUNTER — Telehealth: Payer: Self-pay

## 2024-04-02 NOTE — Telephone Encounter (Signed)
 Called patient and notified her of message.

## 2024-04-02 NOTE — Telephone Encounter (Signed)
-----   Message from Wanda VEAR Cornish sent at 04/01/2024  7:35 AM EDT ----- Regarding: call Tell her heart test looks good

## 2024-04-07 ENCOUNTER — Encounter: Payer: Self-pay | Admitting: Oncology

## 2024-04-10 ENCOUNTER — Other Ambulatory Visit

## 2024-04-16 ENCOUNTER — Inpatient Hospital Stay: Attending: Hematology and Oncology

## 2024-04-16 ENCOUNTER — Inpatient Hospital Stay

## 2024-04-16 ENCOUNTER — Other Ambulatory Visit: Payer: Self-pay

## 2024-04-16 ENCOUNTER — Inpatient Hospital Stay (HOSPITAL_BASED_OUTPATIENT_CLINIC_OR_DEPARTMENT_OTHER): Admitting: Hematology and Oncology

## 2024-04-16 VITALS — BP 101/76 | HR 85 | Temp 99.1°F | Resp 20 | Ht <= 58 in | Wt 132.6 lb

## 2024-04-16 DIAGNOSIS — Z5111 Encounter for antineoplastic chemotherapy: Secondary | ICD-10-CM | POA: Insufficient documentation

## 2024-04-16 DIAGNOSIS — Z79811 Long term (current) use of aromatase inhibitors: Secondary | ICD-10-CM | POA: Diagnosis not present

## 2024-04-16 DIAGNOSIS — Z96641 Presence of right artificial hip joint: Secondary | ICD-10-CM | POA: Insufficient documentation

## 2024-04-16 DIAGNOSIS — C7951 Secondary malignant neoplasm of bone: Secondary | ICD-10-CM

## 2024-04-16 DIAGNOSIS — Z170 Estrogen receptor positive status [ER+]: Secondary | ICD-10-CM | POA: Insufficient documentation

## 2024-04-16 DIAGNOSIS — C50412 Malignant neoplasm of upper-outer quadrant of left female breast: Secondary | ICD-10-CM | POA: Diagnosis not present

## 2024-04-16 DIAGNOSIS — Z5112 Encounter for antineoplastic immunotherapy: Secondary | ICD-10-CM | POA: Insufficient documentation

## 2024-04-16 DIAGNOSIS — Z923 Personal history of irradiation: Secondary | ICD-10-CM | POA: Insufficient documentation

## 2024-04-16 DIAGNOSIS — K269 Duodenal ulcer, unspecified as acute or chronic, without hemorrhage or perforation: Secondary | ICD-10-CM | POA: Diagnosis not present

## 2024-04-16 DIAGNOSIS — M87180 Osteonecrosis due to drugs, jaw: Secondary | ICD-10-CM | POA: Insufficient documentation

## 2024-04-16 DIAGNOSIS — D509 Iron deficiency anemia, unspecified: Secondary | ICD-10-CM | POA: Insufficient documentation

## 2024-04-16 DIAGNOSIS — Z1721 Progesterone receptor positive status: Secondary | ICD-10-CM | POA: Diagnosis not present

## 2024-04-16 DIAGNOSIS — D500 Iron deficiency anemia secondary to blood loss (chronic): Secondary | ICD-10-CM

## 2024-04-16 DIAGNOSIS — K222 Esophageal obstruction: Secondary | ICD-10-CM | POA: Diagnosis not present

## 2024-04-16 DIAGNOSIS — Z1731 Human epidermal growth factor receptor 2 positive status: Secondary | ICD-10-CM | POA: Insufficient documentation

## 2024-04-16 DIAGNOSIS — R7401 Elevation of levels of liver transaminase levels: Secondary | ICD-10-CM | POA: Diagnosis not present

## 2024-04-16 LAB — CBC WITH DIFFERENTIAL (CANCER CENTER ONLY)
Abs Immature Granulocytes: 0.05 K/uL (ref 0.00–0.07)
Basophils Absolute: 0 K/uL (ref 0.0–0.1)
Basophils Relative: 1 %
Eosinophils Absolute: 0.3 K/uL (ref 0.0–0.5)
Eosinophils Relative: 6 %
HCT: 35.7 % — ABNORMAL LOW (ref 36.0–46.0)
Hemoglobin: 10.9 g/dL — ABNORMAL LOW (ref 12.0–15.0)
Immature Granulocytes: 1 %
Lymphocytes Relative: 15 %
Lymphs Abs: 0.8 K/uL (ref 0.7–4.0)
MCH: 26.3 pg (ref 26.0–34.0)
MCHC: 30.5 g/dL (ref 30.0–36.0)
MCV: 86 fL (ref 80.0–100.0)
Monocytes Absolute: 0.6 K/uL (ref 0.1–1.0)
Monocytes Relative: 11 %
Neutro Abs: 3.2 K/uL (ref 1.7–7.7)
Neutrophils Relative %: 66 %
Platelet Count: 255 K/uL (ref 150–400)
RBC: 4.15 MIL/uL (ref 3.87–5.11)
RDW: 18.4 % — ABNORMAL HIGH (ref 11.5–15.5)
WBC Count: 4.9 K/uL (ref 4.0–10.5)
nRBC: 0 % (ref 0.0–0.2)

## 2024-04-16 LAB — CMP (CANCER CENTER ONLY)
ALT: 9 U/L (ref 0–44)
AST: 15 U/L (ref 15–41)
Albumin: 3.7 g/dL (ref 3.5–5.0)
Alkaline Phosphatase: 85 U/L (ref 38–126)
Anion gap: 11 (ref 5–15)
BUN: 20 mg/dL (ref 6–20)
CO2: 29 mmol/L (ref 22–32)
Calcium: 9.7 mg/dL (ref 8.9–10.3)
Chloride: 102 mmol/L (ref 98–111)
Creatinine: 0.57 mg/dL (ref 0.44–1.00)
GFR, Estimated: >60 mL/min (ref >60)
Glucose, Bld: 157 mg/dL — ABNORMAL HIGH (ref 70–99)
Potassium: 3.4 mmol/L — ABNORMAL LOW (ref 3.5–5.1)
Sodium: 141 mmol/L (ref 135–145)
Total Bilirubin: <0.2 mg/dL (ref 0.0–1.2)
Total Protein: 5.8 g/dL — ABNORMAL LOW (ref 6.5–8.1)

## 2024-04-16 LAB — IRON AND TIBC
Iron: 35 ug/dL (ref 28–170)
Saturation Ratios: 13 % (ref 10.4–31.8)
TIBC: 276 ug/dL (ref 250–450)
UIBC: 241 ug/dL

## 2024-04-16 LAB — FERRITIN: Ferritin: 282 ng/mL (ref 11–307)

## 2024-04-16 MED ORDER — FAM-TRASTUZUMAB DERUXTECAN-NXKI CHEMO 100 MG IV SOLR
5.3000 mg/kg | Freq: Once | INTRAVENOUS | Status: AC
  Administered 2024-04-16: 300 mg via INTRAVENOUS
  Filled 2024-04-16: qty 15

## 2024-04-16 MED ORDER — ACETAMINOPHEN 325 MG PO TABS: 650.0000 mg | ORAL_TABLET | Freq: Once | ORAL | Status: DC

## 2024-04-16 MED ORDER — SODIUM CHLORIDE 0.9 % IV SOLN
150.0000 mg | Freq: Once | INTRAVENOUS | Status: AC
  Administered 2024-04-16: 150 mg via INTRAVENOUS
  Filled 2024-04-16: qty 150

## 2024-04-16 MED ORDER — DEXAMETHASONE SODIUM PHOSPHATE 10 MG/ML IJ SOLN
10.0000 mg | Freq: Once | INTRAMUSCULAR | Status: AC
  Administered 2024-04-16: 10 mg via INTRAVENOUS
  Filled 2024-04-16: qty 1

## 2024-04-16 MED ORDER — DEXTROSE 5 % IV SOLN: Freq: Once | INTRAVENOUS | Status: AC

## 2024-04-16 MED ORDER — DIPHENHYDRAMINE HCL 25 MG PO CAPS: 50.0000 mg | ORAL_CAPSULE | Freq: Once | ORAL | Status: DC

## 2024-04-16 MED ORDER — PALONOSETRON HCL INJECTION 0.25 MG/5ML
0.2500 mg | Freq: Once | INTRAVENOUS | Status: AC
  Administered 2024-04-16: 0.25 mg via INTRAVENOUS
  Filled 2024-04-16: qty 5

## 2024-04-16 MED ORDER — FULVESTRANT 250 MG/5ML IM SOSY
500.0000 mg | PREFILLED_SYRINGE | Freq: Once | INTRAMUSCULAR | Status: AC
  Administered 2024-04-16: 500 mg via INTRAMUSCULAR
  Filled 2024-04-16: qty 10

## 2024-04-16 NOTE — Progress Notes (Signed)
 [facility]  [address] [place],  [place]  [address] [phone] Clinic Day: 04/16/2024  Referring physician: Pia Kerney SQUIBB, MD  ASSESSMENT & PLAN:  Assessment: Malignant neoplasm metastatic to bone [facility]) Bone metastases diagnosed in November 2022, with multiple marrow replacing lesions within the proximal right femur within the ischium as well as the inferior rami. 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 Ki67 was 60%. PET imaging in December revealed dominant finding of intensely hypermetabolic aggressive skeletal metastasis involving the calvarium, sternum, thoracic and lumbar spine, and pelvis, with potential pathologic fracture of the right femur with internal fixation. There was soft tissue extension into the anterior mediastinum associated with the manubrial metastasis. Multiple spinal vertebral body lesions had cortical destruction along the posterior margin. She started monthly Xgeva  in December, and completed radiation therapy. She continued with Herceptin /Perjeta  until she had progression of disease. In January 2024, she developed increased pain of the left hip. She was found to have innumerable metastatic lesions throughout the pelvis and proximal femur. There is a non displaced pathologic fracture through a large lesion in the superior left acetabulum. She was referred to an orthopedic oncologist at [facility] and they recommended radiation and no surgery. She received her palliative radiation.    We then switched her to Enhertu  chemotherapy in March of 2024, but she has had significant toxicities of nausea, vomiting, diarrhea, and hypokalemia.  She was also placed on hormonal therapy with Faslodex  injections.  Repeat CT imaging in July revealed decreased soft tissue thickening around the left subclavian structures and thoracic inlet.  Left lateral breast mass was stable.   Widespread mixed lytic and sclerotic osseous metastatic lesions were stable.  There was unchanged severe elevation of the left hemidiaphragm. CT neck in October to evaluate for possible soft tissue mass and painful swallowing did not reveal any soft tissue masses.  The extensive mixed lytic and sclerotic skeletal metastases were much more pronounced in the visible upper thorax than in the cervical spine and were felt to be grossly stable from July 2024. CT chest, abdomen and pelvis in November revealed stable bony disease without evidence of new/progressive disease.  Due to the development of osteonecrosis of the jaw, denosumab  was discontinued 2025   She completed 18 cycles of Enhertu , which was then placed on hold due to severe anemia.  She required multiple units of blood. She was found to be iron  deficient and received IV iron  in the form of Venofer  for 5 doses.  We resumed Enhertu  in April.  She has continued fulvestrant  monthly.  In May, patient requested she receive Enhertu  every 4 weeks.   She was referred to Dr. Charlanne to consider evaluation of the iron  deficiency.  He will hold off on a colonoscopy. He performed an EGD 03/13/2024 which revealed both gastric and duodenal ulcers as well as esophageal stricture.   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  She was treated with lumpectomy followed by adjuvant chemotherapy and radiation therapy. She was on adjuvant hormonal therapy with tamoxifen 20 mg daily from October 2015 to May 2020, but due to elevation of the liver transaminases was switched to anastrazole.  She then developed bone metastasis in November 2022 so has been receiving chemotherapy. She had skin thickening on her last mammogram, but a nodule in  the left breast was stable on CT imaging. The skin thickening is felt to possibly due to radiation, as her exam remained  stable. Continued mammograms are not recommended.   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 She developed pain of the right jaw in February 2025.  Panorex x-rays revealed an abnormal area in the right lower jaw consistent with osteonecrosis.  Xgeva  was discontinued.  She was treated with Augmentin  in February.  She has been working closely with her dentist. She has had 2 fillings done, but also may need root canals.  She sees him again on April 8.  She had swelling and erythema of the left jaw earlier this month, so was placed on amoxicillin  4 times daily, with resolution of the swelling and erythema.  The redness and swelling have resolved, but she states she still has some mild tenderness of the anterior left jaw.  She has had recurrent swelling and erythema and requires intermittent courses of Amoxicillin .    Iron  deficiency anemia New iron  deficiency anemia diagnosed in March of 2025, when she presented with acute worsening of her anemia. She required transfusion of 3 units of PRBC's. She then received IV iron  in the form of Venofer  for 5 doses. She is taking oral iron  now.   Her hemoglobin has slightly improved, which may be due to hemoconcentration.  She was referred to Dr. Charlanne to consider evaluation of the iron  deficiency. He will hold off on a colonoscopy. He performed an EGD 03/13/2024 which revealed both gastric and duodenal ulcers as well as esophageal stricture. He did recommend increasing the omeprazole  to 40 mg BID and added Carafate .    Plan: She continues Enhertu  and Faslodex  injections every 4 weeks. Her usual chronic pain has been controlled with oxycodone  10 mg Q4hrs prn but she complains of constipation, and dysuria. I will order a urine culture and urinalysis. The blisters on her right leg are now developing on the left, she informed me that [facility]. She did meet with Dr. Charlanne in June who decided not  to do a colonoscopy, but did schedule a endoscopy. This revealed both gastric and duodenal ulcers as well as esophageal stricture. He did recommend increasing the omeprazole  to 40 mg BID and added Carafate . I recommended she increase protein in her diet. I will see her back in 4 weeks with CBC and CMP. The patient and her husband understand the plans discussed today and are in agreement with them.  She knows to contact our office if she develops concerns prior to her next appointment.  I provided 20 minutes of face-to-face time during this encounter and > 50% was spent counseling as documented under my assessment and plan.   Eleanor Bach, FNP- [facility] DE[facility] OF [facility] [address] [place] [address] Dept: [phone] Dept Fax: [phone]   No orders of the defined types were placed in this encounter.   CHIEF COMPLAINT:  CC: Metastatic hormone and HER2 receptor positive breast cancer  Current Treatment: Enhertu  every 4 weeks  HISTORY OF PRESENT ILLNESS:   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 (HCC)   07/23/2013 Cancer Staging   Staging form: Breast, AJCC 7th Edition - Clinical stage from 07/23/2013:  Stage IIB (T2, N1, M0) - Signed by Cornelius Wanda DEL, MD on 07/04/2020 Prognostic indicators: Pos LVI   09/22/2021 - 04/06/2022 Chemotherapy   Patient is on Treatment Plan : BREAST  Trastuzumab  + Pertuzumab  q21d      09/22/2021 - 09/21/2022 Chemotherapy   Patient is on Treatment Plan : BREAST Trastuzumab  + Pertuzumab  q21d     10/12/2022 -  Chemotherapy   Patient is on Treatment Plan : BREAST METASTATIC Fam-Trastuzumab Deruxtecan-nxki  (Enhertu ) (5.4) q28d (changed from q21d on 12/25/23)     Malignant neoplasm metastatic to bone (HCC)  06/28/2021 Initial Diagnosis    Bone metastases (HCC)   09/22/2021 - 04/06/2022 Chemotherapy   Patient is on Treatment Plan : BREAST  Trastuzumab  + Pertuzumab  q21d      09/22/2021 - 09/21/2022 Chemotherapy   Patient is on Treatment Plan : BREAST Trastuzumab  + Pertuzumab  q21d     05/11/2022 Imaging   CT chest, abdomen and pelvis:  IMPRESSION:  1. Widespread bony metastatic disease shows increased sclerosis  since previous imaging. This is likely related to response to  therapy in the interval. There is a single lesion along the LEFT  iliac which shows continued lytic change and warrants attention on  follow-up  2. Interval development of pathologic fractures at in the upper  thoracic spine and at the thoracolumbar junction with associated  kyphosis.  3. No signs of solid organ or nodal metastatic disease.  4. Marked elevation of the LEFT hemidiaphragm as on the prior PET.  5. Aortic atherosclerosis.    10/12/2022 -  Chemotherapy   Patient is on Treatment Plan : BREAST METASTATIC Fam-Trastuzumab Deruxtecan-nxki  (Enhertu ) (5.4) q28d (changed from q21d on 12/25/23)       INTERVAL HISTORY:  Anna [name] is here today for repeat clinical assessment for her metastatic hormone and HER2 receptor positive breast cancer. Patient states that she feels well and complains of lower back pain rated 4/10. She visited the dentist, where she was diagnosed with a tooth infection. She was prescribed Augmentin , but has not yet started this treatment. I encouraged her to do so since it should also help her ONJ. She had an upper GI endoscopy performed on 03/13/2024 by Dr. Charlanne which revealed an esophageal stricture, one 6 mm non-bleeding cratered gastric ulcer with surrounding atrophic gastritis, and one 12 mm non-bleeding cratered duodenal ulcer . I explained these results to the patient and her husband and they understand. Dr. Charlanne recommended she increase her 40 mg omeprazole  to BID and added Carafate  1 gram four times daily for the ulcers and I  agree with this. I reminded her to take Carafate  separately from her other medications as it coats the stomach lining and can prevent absorption. Based on her past CBC results, I suspect that she had a significant amount of bleeding from the ulcers at the beginning of the year that has since discontinued. She is currently taking 1 Aspirin a day. I cautioned her to avoid NSAIDs as these can cause gastritis and ulcers. I recommended she continue oxycodone  for her bone pain.  We will see her back in 4 weeks with CBC, CMP, iron , TIBC, and ferritin for her next cycle of Enhertu . She denies fever, chills, night sweats, or other signs of infection. She denies cardiorespiratory and gastrointestinal issues. She  denies pain. Her appetite is good and Her weight has been stable at 129lb 9.6oz.  REVIEW OF SYSTEMS:   Review of Systems  Constitutional:  Positive for fatigue. Negative for appetite change, chills,  fever and unexpected weight change.  HENT:   Negative for lump/mass, mouth sores, sore throat and trouble swallowing.        Jaw swelling and erythema likely related to her ONJ  Respiratory:  Negative for cough and shortness of breath.   Cardiovascular:  Negative for chest pain and leg swelling.  Gastrointestinal:  Positive for constipation. Negative for abdominal pain, diarrhea and vomiting. Nausea: some. Genitourinary:  Negative for bladder incontinence, difficulty urinating, dysuria, frequency and hematuria.   Musculoskeletal:  Positive for back pain (Lower back 4/10) and neck pain. Negative for arthralgias, gait problem and myalgias.  Skin:  Positive for wound (Bilateral leg ulcers, newly on the left now healing). Negative for rash.  Neurological:  Negative for dizziness, extremity weakness, gait problem, headaches, light-headedness and numbness.  Hematological:  Negative for adenopathy. Does not bruise/bleed easily.  Psychiatric/Behavioral:  Negative for depression and sleep disturbance. The patient  is not nervous/anxious.     VITALS:   Blood pressure 101/76, pulse 85, temperature 99.1 F (37.3 C), temperature source Oral, resp. rate 20, height 4' 8.5 (1.435 m), weight 132 lb 9.6 oz (60.1 kg), last menstrual period 08/18/2013, SpO2 94%.  Wt Readings from Last 3 Encounters:  04/16/24 132 lb 9.6 oz (60.1 kg)  03/20/24 129 lb 9.6 oz (58.8 kg)  03/13/24 129 lb (58.5 kg)    Body mass index is 29.2 kg/m.  Performance status (ECOG): 1 - Symptomatic but completely ambulatory    PHYSICAL EXAM:   Physical Exam Vitals and nursing note reviewed.  Constitutional:      General: She is not in acute distress.    Appearance: She is normal weight. She is not ill-appearing.  HENT:     Head: Normocephalic and atraumatic.     Comments: She has some firm swelling in the submental area    Right Ear: Tympanic membrane, ear canal and external ear normal.     Left Ear: Tympanic membrane, ear canal and external ear normal.     Nose: Nose normal.     Mouth/Throat:     Mouth: Mucous membranes are moist.     Pharynx: Oropharynx is clear. No oropharyngeal exudate or posterior oropharyngeal erythema.  Eyes:     General: No scleral icterus.    Extraocular Movements: Extraocular movements intact.     Conjunctiva/sclera: Conjunctivae normal.     Pupils: Pupils are equal, round, and reactive to light.  Cardiovascular:     Rate and Rhythm: Normal rate and regular rhythm.     Pulses: Normal pulses.     Heart sounds: Normal heart sounds. No murmur heard.    No friction rub. No gallop.  Pulmonary:     Effort: Pulmonary effort is normal.     Breath sounds: Normal breath sounds. No wheezing, rhonchi or rales.  Chest:     Comments: Breast exam is deferred Abdominal:     General: Bowel sounds are normal. There is no distension.     Palpations: Abdomen is soft. There is no hepatomegaly, splenomegaly or mass.     Tenderness: There is no abdominal tenderness.  Musculoskeletal:        General: Normal  range of motion.     Cervical back: Normal range of motion and neck supple. No tenderness.     Right lower leg: Edema (mild) present.     Left lower leg: Edema (mild) present.  Lymphadenopathy:     Cervical: No cervical adenopathy.     Upper Body:  Right upper body: No supraclavicular or axillary adenopathy.     Left upper body: No supraclavicular or axillary adenopathy.     Lower Body: No right inguinal adenopathy. No left inguinal adenopathy.  Skin:    General: Skin is warm and dry.     Coloration: Skin is not jaundiced.     Findings: No rash.  Neurological:     General: No focal deficit present.     Mental Status: She is alert and oriented to person, place, and time. Mental status is at baseline.     Cranial Nerves: No cranial nerve deficit.  Psychiatric:        Mood and Affect: Mood normal.        Behavior: Behavior normal.        Thought Content: Thought content normal.        Judgment: Judgment normal.     LABS:      Latest Ref Rng & Units 04/16/2024   10:08 AM 03/20/2024    9:26 AM 02/21/2024    8:08 AM  CBC  WBC 4.0 - 10.5 K/uL 4.9  5.7  6.2   Hemoglobin 12.0 - 15.0 g/dL 89.0  89.8  88.9   Hematocrit 36.0 - 46.0 % 35.7  33.4  35.3   Platelets 150 - 400 K/uL 255  304  290       Latest Ref Rng & Units 04/16/2024   10:08 AM 03/20/2024    9:26 AM 02/21/2024    8:08 AM  CMP  Glucose 70 - 99 mg/dL 842  865  895   BUN 6 - 20 mg/dL 20  25  25    Creatinine 0.44 - 1.00 mg/dL 9.42  9.44  9.45   Sodium 135 - 145 mmol/L 141  140  139   Potassium 3.5 - 5.1 mmol/L 3.4  3.8  4.1   Chloride 98 - 111 mmol/L 102  103  103   CO2 22 - 32 mmol/L 29  27  28    Calcium  8.9 - 10.3 mg/dL 9.7  9.6  9.5   Total Protein 6.5 - 8.1 g/dL 5.8  5.8  5.9   Total Bilirubin 0.0 - 1.2 mg/dL <9.7  <9.7  0.2   Alkaline Phos 38 - 126 U/L 85  79  94   AST 15 - 41 U/L 15  19  19    ALT 0 - 44 U/L 9  7  16     Lab Results  Component Value Date   CEA1 2.2 07/20/2021   /  CEA  Date Value Ref Range  Status  07/20/2021 2.2 0.0 - 4.7 ng/mL Final    Comment:    (NOTE)                             Nonsmokers          <3.9                             Smokers             <5.6 [employer] Electrochemiluminescence Immunoassay (ECLIA) Values obtained with different assay methods or kits cannot be used interchangeably.  Results cannot be interpreted as absolute evidence of the presence or absence of malignant disease. Performed At: [facility] [address] [place], [place] [id number] Jennette Shorter MD Ey:[phone]     Lab Results  Component Value Date  TIBC 269 01/24/2024   TIBC 251 10/25/2023   TIBC 225 (L) 07/10/2023   FERRITIN 326 (H) 01/24/2024   FERRITIN 129 10/25/2023   FERRITIN 305 07/10/2023   IRONPCTSAT 13 01/24/2024   IRONPCTSAT 8 (L) 10/25/2023   IRONPCTSAT 11 07/10/2023   Lab Results  Component Value Date   LDH 143 10/25/2023   LDH 171 07/10/2023    STUDIES:   ECHOCARDIOGRAM COMPLETE Result Date: 03/26/2024    ECHOCARDIOGRAM REPORT   Patient Name:   Vanessia P Badia Date of Exam: 03/25/2024 Medical Rec #:  [id number]           Height:       56.5 in Accession #:    [id number]          Weight:       129.6 lb Date of Birth:  1970/08/25          BSA:          1.486 m Patient Age:    52 years            BP:           107/61 mmHg Patient Gender: F                   HR:           114 bpm. Exam Location:  [facility] Procedure: 2D Echo, Cardiac Doppler, Color Doppler and Strain Analysis (Both            Spectral and Color Flow Doppler were utilized during procedure). Indications:    Malignant neoplasm metastatic to bone [facility]) [C79.51 (ICD-10-CM)]  History:        Patient has prior history of Echocardiogram examinations, most                 recent 12/12/2023. Signs/Symptoms:Murmur and Chest Pain; Risk                 Factors:Hypertension, Dyslipidemia and Diabetes.  Sonographer:    Charlie Jointer RDCS Referring Phys: [id number] CHRISTINE H MCCARTY  Sonographer  Comments: Technically difficult study due to poor echo windows. IMPRESSIONS  1. Left ventricular ejection fraction, by estimation, is 60 to 65%. The left ventricle has normal function. The left ventricle has no regional wall motion abnormalities. Left ventricular diastolic parameters are consistent with Grade I diastolic dysfunction (impaired relaxation). The average left ventricular global longitudinal strain is -14.4 %. The global longitudinal strain is abnormal.  2. Right ventricular systolic function is normal. The right ventricular size is normal.  3. The mitral valve is normal in structure. No evidence of mitral valve regurgitation. No evidence of mitral stenosis.  4. The aortic valve is normal in structure. Aortic valve regurgitation is not visualized. No aortic stenosis is present.  5. The inferior vena cava is normal in size with greater than 50% respiratory variability, suggesting right atrial pressure of 3 mmHg. FINDINGS  Left Ventricle: Left ventricular ejection fraction, by estimation, is 60 to 65%. The left ventricle has normal function. The left ventricle has no regional wall motion abnormalities. The average left ventricular global longitudinal strain is -14.4 %. Strain was performed and the global longitudinal strain is abnormal. The left ventricular internal cavity size was normal in size. There is no left ventricular hypertrophy. Left ventricular diastolic parameters are consistent with Grade I diastolic dysfunction (impaired relaxation). Right Ventricle: The right ventricular size is normal. No increase in right ventricular wall thickness. Right ventricular systolic function is normal. Left Atrium:  Left atrial size was normal in size. Right Atrium: Right atrial size was normal in size. Pericardium: There is no evidence of pericardial effusion. Mitral Valve: The mitral valve is normal in structure. No evidence of mitral valve regurgitation. No evidence of mitral valve stenosis. Tricuspid Valve:  The tricuspid valve is normal in structure. Tricuspid valve regurgitation is not demonstrated. No evidence of tricuspid stenosis. Aortic Valve: The aortic valve is normal in structure. Aortic valve regurgitation is not visualized. No aortic stenosis is present. Pulmonic Valve: The pulmonic valve was normal in structure. Pulmonic valve regurgitation is not visualized. No evidence of pulmonic stenosis. Aorta: The aortic root is normal in size and structure. Venous: The inferior vena cava is normal in size with greater than 50% respiratory variability, suggesting right atrial pressure of 3 mmHg. IAS/Shunts: No atrial level shunt detected by color flow Doppler.  LEFT VENTRICLE PLAX 2D LVIDd:         3.30 cm   Diastology LVIDs:         2.20 cm   LV e' medial:    8.59 cm/s LV PW:         1.00 cm   LV E/e' medial:  11.6 LV IVS:        1.00 cm   LV e' lateral:   8.85 cm/s LVOT diam:     2.00 cm   LV E/e' lateral: 11.2 LV SV:         40 LV SV Index:   27        2D Longitudinal Strain LVOT Area:     3.14 cm  2D Strain GLS Avg:     -14.4 %  RIGHT VENTRICLE             IVC RV S prime:     12.30 cm/s  IVC diam: 1.90 cm TAPSE (M-mode): 2.0 cm LEFT ATRIUM         Index LA diam:    2.00 cm 1.35 cm/m  AORTIC VALVE LVOT Vmax:   89.50 cm/s LVOT Vmean:  57.733 cm/s LVOT VTI:    0.127 m  AORTA Ao Root diam: 2.60 cm Ao Asc diam:  2.80 cm Ao Desc diam: 2.30 cm MITRAL VALVE MV Area (PHT): 5.97 cm    SHUNTS MV Decel Time: 127 msec    Systemic VTI:  0.13 m MV E velocity: 99.30 cm/s  Systemic Diam: 2.00 cm MV A velocity: 96.90 cm/s MV E/A ratio:  1.02 Jennifer Crape MD Electronically signed by Jennifer Crape MD Signature Date/Time: 03/26/2024/12:19:23 PM    Final      EXAM: 09/05/2023 CT CHEST, ABDOMEN, AND PELVIS WITH CONTRAST IMPRESSION: 1. Unchanged size of the mass in the left breast with overlying skin thickening. 2. No significant interval change in the widespread osseous metastatic disease involving the axial and proximal  appendicular skeleton. 3. Similar soft tissue thickening about the left subclavian structures and thoracic inlet, possibly reflecting posttreatment change. Suggest continued attention on follow-up imaging. 4. No evidence of new or progressive disease within the chest, abdomen or pelvis. 5.  Aortic Atherosclerosis (ICD10-I70.0).  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   Anxiety    Bipolar disorder (HCC) 11/29/2020   Blood transfusion without reported diagnosis    Breast cancer (HCC)    Cancer (  HCC)    Cardiac murmur    Chest pain    Chronic fatigue syndrome 11/29/2020   Depression    Essential hypertension 11/29/2020   Hepatic steatosis determined by biopsy of liver 07/04/2020   HLD (hyperlipidemia)    Hypertension    Hypomagnesemia 03/26/2023   Hypothyroidism 11/29/2020   Insomnia 11/29/2020   Iron  deficiency anemia 06/05/2022   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   Migraine 11/29/2020   Mild intermittent asthma 11/29/2020   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   Mixed hyperlipidemia 11/29/2020   Obesity due to excess calories    Osteopenia after menopause 07/04/2020   Other long term (current) drug therapy 11/29/2020   Other vitamin B12 deficiency anemias 11/29/2020   Personal history of malignant neoplasm of breast 11/29/2020   Pre-diabetes    Renal cyst, acquired, right 07/04/2020   14.5 cm in October 2021   Retrocalcaneal bursitis (back of heel), left 04/22/2018   Seasonal allergic rhinitis 11/29/2020   Tightness of heel cord, left 04/22/2018   Tremor 08/02/2023   Type 2 diabetes mellitus without complications (HCC) 11/29/2020   Vitamin D  deficiency 11/29/2020    Past Surgical History:   Procedure Laterality Date   CESAREAN SECTION     COLONOSCOPY     IR BONE TUMOR(S)RF ABLATION  05/29/2022   IR BONE TUMOR(S)RF ABLATION  05/29/2022   IR KYPHO EA ADDL LEVEL THORACIC OR LUMBAR  05/29/2022   IR KYPHO THORACIC WITH BONE BIOPSY  05/29/2022   IR RADIOLOGIST EVAL & MGMT  05/22/2022   IR RADIOLOGIST EVAL & MGMT  06/12/2022   LIVER BIOPSY  01/2019   lumpectomy  2014   Left Breast   PORT-A-CATH REMOVAL     PORTACATH PLACEMENT  08/28/2013   STOMACH SURGERY     Tummy Tuck   TOTAL HIP ARTHROPLASTY Right 07/04/2021   Procedure: TOTAL HIP ARTHROPLASTY;  Surgeon: Ernie Cough, MD;  Location: [facility];  Service: Orthopedics;  Laterality: Right;  89   WISDOM TOOTH EXTRACTION      Family History  Problem Relation Age of Onset   Hyperlipidemia Mother    Diabetes Father    Stroke Father    Colon cancer Neg Hx    Colon polyps Neg Hx    Esophageal cancer Neg Hx    Rectal cancer Neg Hx    Stomach cancer Neg Hx     Social History:  reports that she quit smoking about 21 years ago. Her smoking use included cigarettes. She has never used smokeless tobacco. She reports that she does not currently use alcohol. She reports that she does not use drugs.The patient is accompanied by her husband today.  Allergies:  Allergies  Allergen Reactions   Xgeva  [Denosumab ] Other (See Comments)    Possible ONJ (09/13/23)   Reglan  [Metoclopramide ] Other (See Comments)    Made patient feel really funny when taking this medication     Current Medications: Current Outpatient Medications  Medication Sig Dispense Refill   albuterol  (VENTOLIN  HFA) 108 (90 Base) MCG/ACT inhaler Inhale 2 puffs into the lungs every 6 (six) hours as needed for shortness of breath.     ARIPiprazole  (ABILIFY ) 5 MG tablet Take 2.5 mg by mouth daily.     aspirin 81 MG EC tablet Take 81 mg by mouth daily. Swallow whole.     atorvastatin  (LIPITOR) 20 MG tablet Take 1 tablet (20 mg total) by mouth daily. 90 tablet 3   calcium   carbonate (OS-CAL) 1250 (500 Ca) MG chewable tablet Chew by mouth 3 (three) times daily.     dapagliflozin  propanediol (FARXIGA ) 10 MG TABS tablet TAKE ONE TABLET BY MOUTH EVERY DAY 30 tablet 10   desvenlafaxine  (PRISTIQ ) 50 MG 24 hr tablet Take 50 mg by mouth daily.     dexamethasone  (DECADRON ) 4 MG tablet TAKE ONE TABLET BY MOUTH 2 TIMES A DAY FOR 5 DAYS AFTER CHEMOTHERAPY 20 tablet 2   diphenoxylate -atropine  (LOMOTIL ) 2.5-0.025 MG tablet TAKE TWO TABLETS BY MOUTH FOUR TIMES A DAY AS NEEDED FOR DIARRHEA OR LOOSE STOOLS 100 tablet 0   folic acid  (FOLVITE ) 1 MG tablet Take 1 tablet (1 mg total) by mouth daily. 30 tablet 5   labetalol  (NORMODYNE ) 100 MG tablet Take 1 tablet (100 mg total) by mouth 2 (two) times daily. 180 tablet 3   loratadine  (CLARITIN ) 10 MG tablet TAKE ONE TABLET BY MOUTH EVERY DAY 30 tablet 5   LORazepam  (ATIVAN ) 1 MG tablet Take 1 tablet (1 mg total) by mouth every 6 (six) hours as needed for anxiety (nausea). 60 tablet 0   magic mouthwash (lidocaine , diphenhydrAMINE , alum & mag hydroxide) suspension Take 5 mLs by mouth every 3 (three) hours as needed. Swish and spit; for throat/mouth pain     nitroGLYCERIN  (NITROSTAT ) 0.4 MG SL tablet Place 0.4 mg under the tongue every 5 (five) minutes as needed for chest pain.     nystatin  (MYCOSTATIN /NYSTOP ) powder Apply 1 Application topically 2 (two) times daily.     omeprazole  (PRILOSEC) 40 MG capsule Take 1 capsule (40 mg total) by mouth at bedtime. (Patient taking differently: Take 40 mg by mouth in the morning and at bedtime.) 30 capsule 5   ondansetron  (ZOFRAN ) 8 MG tablet Take 1 tablet (8 mg total) by mouth every 8 (eight) hours as needed for nausea or vomiting. 60 tablet 5   Oxycodone  HCl 10 MG TABS TAKE ONE TABLET BY MOUTH EVERY 4 HOURS AS NEEDED FOR PAIN 180 tablet 0   Potassium 99 MG TABS Take 2 tablets by mouth 2 (two) times daily. GUMMIES     Vibegron (GEMTESA) 75 MG TABS Take 1 tablet by mouth daily.     augmented  betamethasone dipropionate (DIPROLENE-AF) 0.05 % cream  (Patient taking differently: 1 Application as needed.)     naloxone  (NARCAN ) nasal spray 4 mg/0.1 mL One spray in nostril as needed, may repeat every 2 to 3 minutes until medical assistance becomes available (Patient not taking: No sig reported) 4 each 1   prochlorperazine  (COMPAZINE ) 10 MG tablet TAKE ONE TABLET BY MOUTH EVERY 6 HOURS AS NEEDED FOR NAUSEA/VOMITING 90 tablet 3   Current Facility-Administered Medications  Medication Dose Route Frequency Provider Last Rate Last Admin   technetium sestamibi generic (CARDIOLITE ) injection 10.5 millicurie  10.5 millicurie Intravenous Once PRN Revankar, Rajan R, MD       Facility-Administered Medications Ordered in Other Visits  Medication Dose Route Frequency Provider Last Rate Last Admin   acetaminophen  (TYLENOL ) tablet 650 mg  650 mg Oral Once Cornelius Wanda DEL, MD       diphenhydrAMINE  (BENADRYL ) capsule 50 mg  50 mg Oral Once Cornelius Wanda DEL, MD       fulvestrant  (FASLODEX ) injection 500 mg  500 mg Intramuscular Once Cornelius Wanda DEL, MD       heparin  lock flush 100 unit/mL  500 Units Intracatheter Once PRN Harl Setter A, NP       sodium chloride  flush (NS) 0.9 % injection  10 mL  10 mL Intracatheter PRN Berkeley Spanner A, PA-C   10 mL at 11/15/23 1111   sodium chloride  flush (NS) 0.9 % injection 10 mL  10 mL Intracatheter PRN Berkeley Spanner A, PA-C       sodium chloride  flush (NS) 0.9 % injection 10 mL  10 mL Intravenous PRN Cornelius Wanda DEL, MD   10 mL at 12/06/23 1109   sodium chloride  flush 0.9 % injection 10 mL  10 mL Intravenous PRN Cornelius Wanda DEL, MD   10 mL at 11/05/23 [phone]

## 2024-04-16 NOTE — Addendum Note (Signed)
 Addended by: TWANDA DELON BRAVO on: 04/16/2024 12:52 PM   Modules accepted: Orders

## 2024-04-17 ENCOUNTER — Encounter: Payer: Self-pay | Admitting: Cardiology

## 2024-04-17 ENCOUNTER — Other Ambulatory Visit: Payer: Self-pay | Admitting: Oncology

## 2024-04-17 ENCOUNTER — Ambulatory Visit: Payer: Self-pay | Attending: Cardiology | Admitting: Cardiology

## 2024-04-17 VITALS — BP 90/60 | HR 98 | Ht <= 58 in | Wt 134.5 lb

## 2024-04-17 DIAGNOSIS — I10 Essential (primary) hypertension: Secondary | ICD-10-CM | POA: Diagnosis not present

## 2024-04-17 DIAGNOSIS — M545 Low back pain, unspecified: Secondary | ICD-10-CM

## 2024-04-17 DIAGNOSIS — Z09 Encounter for follow-up examination after completed treatment for conditions other than malignant neoplasm: Secondary | ICD-10-CM

## 2024-04-17 DIAGNOSIS — G8929 Other chronic pain: Secondary | ICD-10-CM

## 2024-04-17 MED ORDER — LABETALOL HCL 100 MG PO TABS: 50.0000 mg | ORAL_TABLET | Freq: Two times a day (BID) | ORAL | 3 refills | Status: DC

## 2024-04-17 NOTE — Patient Instructions (Signed)
 Medication Instructions:  Your physician has recommended you make the following change in your medication:  DECREASE: Labetalol  50 mg twice daily.  *If you need a refill on your cardiac medications before your next appointment, please call your pharmacy*   Testing/Procedures: Your physician has requested that you have an echocardiogram - [place]. Echocardiography is a painless test that uses sound waves to create images of your heart. It provides your doctor with information about the size and shape of your heart and how well your heart's chambers and valves are working. This procedure takes approximately one hour. There are no restrictions for this procedure. Please do NOT wear cologne, perfume, aftershave, or lotions (deodorant is allowed). Please arrive 15 minutes prior to your appointment time.  Please note: We ask at that you not bring children with you during ultrasound (echo/ vascular) testing. Due to room size and safety concerns, children are not allowed in the ultrasound rooms during exams. Our front office staff cannot provide observation of children in our lobby area while testing is being conducted. An adult accompanying a patient to their appointment will only be allowed in the ultrasound room at the discretion of the ultrasound technician under special circumstances. We apologize for any inconvenience.   Follow-Up: At [facility], you and your health needs are our priority.  As part of our continuing mission to provide you with exceptional heart care, our providers are all part of one team.  This team includes your primary Cardiologist (physician) and Advanced Practice Providers or APPs (Physician Assistants and Nurse Practitioners) who all work together to provide you with the care you need, when you need it.  Your next appointment:   1 year(s)  Provider:   Kardie Tobb, DO

## 2024-04-17 NOTE — Progress Notes (Signed)
 Cardiology Office Note:    Date:  04/18/2024   ID:  Asberry SQUIBB Yusupov, DOB May 28, 1971, MRN [id number]  PCP:  Pia Kerney SQUIBB, MD  Cardiologist:  Dub Huntsman, DO  Electrophysiologist:  None   Referring MD: Pia Kerney SQUIBB, MD    I am ok   History of Present Illness:    Anna [name] is a 53 y.o. 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   She has been undergoing chemotherapy for almost a year, with treatments every three weeks. She report feeling better since stopping valsartan  due to low blood pressure, tiredness, and lightheadedness. She is currently on labetalol , which she has been taking for a long time, possibly since after having children. She is also is also on Lipitor.  The patient reports shortness of breath with activity but denies any other symptoms. She has not been doing any any exercise recently. She also mention that their potassium has been low for a while, requiring supplementation. She is due for blood work at the cancer center later in the day.  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/2023   Anxiety    Bipolar disorder (HCC) 11/29/2020   Blood transfusion without reported diagnosis    Breast cancer (HCC)    Cancer (HCC)    Cardiac murmur    Chest pain    Chronic fatigue syndrome 11/29/2020   Depression    Essential hypertension 11/29/2020   Hepatic steatosis determined by biopsy of liver 07/04/2020   HLD (hyperlipidemia)    Hypertension    Hypomagnesemia 03/26/2023   Hypothyroidism 11/29/2020   Insomnia 11/29/2020   Iron  deficiency anemia 06/05/2022   Malignant neoplasm of upper-outer quadrant of left female  breast (HCC) 07/23/2013   Migraine 11/29/2020   Mild intermittent asthma 11/29/2020   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   Mixed hyperlipidemia 11/29/2020   Obesity due to excess calories    Osteopenia after menopause 07/04/2020   Other long term (current) drug therapy 11/29/2020   Other vitamin B12 deficiency anemias 11/29/2020   Personal history of malignant neoplasm of breast 11/29/2020   Pre-diabetes    Renal cyst, acquired, right 07/04/2020   14.5 cm in October 2021   Retrocalcaneal bursitis (back of heel), left 04/22/2018   Seasonal allergic rhinitis 11/29/2020   Tightness of heel cord, left 04/22/2018   Tremor 08/02/2023   Type 2 diabetes mellitus without complications (HCC) 11/29/2020   Vitamin D  deficiency 11/29/2020    Past Surgical History:  Procedure Laterality Date   CESAREAN SECTION     COLONOSCOPY     IR BONE TUMOR(S)RF ABLATION  05/29/2022   IR BONE TUMOR(S)RF ABLATION  05/29/2022   IR KYPHO EA ADDL LEVEL THORACIC OR LUMBAR  05/29/2022   IR KYPHO THORACIC WITH BONE BIOPSY  05/29/2022   IR RADIOLOGIST EVAL & MGMT  05/22/2022   IR RADIOLOGIST EVAL & MGMT  06/12/2022   LIVER BIOPSY  01/2019   lumpectomy  2014   Left Breast   PORT-A-CATH REMOVAL     PORTACATH PLACEMENT  08/28/2013   STOMACH SURGERY     Tummy Tuck   TOTAL HIP ARTHROPLASTY Right 07/04/2021   Procedure: TOTAL HIP ARTHROPLASTY;  Surgeon: Ernie Cough, MD;  Location: [facility];  Service: Orthopedics;  Laterality: Right;  90   WISDOM TOOTH EXTRACTION      Current Medications: Current Meds  Medication Sig   albuterol  (VENTOLIN  HFA) 108 (90 Base) MCG/ACT inhaler Inhale 2 puffs into the lungs every 6 (six) hours as needed for shortness of breath.   ARIPiprazole  (ABILIFY ) 5 MG tablet Take 2.5 mg by mouth daily.   aspirin 81 MG EC tablet Take 81 mg by mouth daily. Swallow whole.   atorvastatin  (LIPITOR) 20 MG tablet Take 1 tablet (20 mg total) by mouth daily.   augmented betamethasone  dipropionate (DIPROLENE-AF) 0.05 % cream  (Patient taking differently: 1 Application as needed.)   calcium  carbonate (OS-CAL) 1250 (500 Ca) MG chewable tablet Chew by mouth 3 (three) times daily.   dapagliflozin  propanediol (FARXIGA ) 10 MG TABS tablet TAKE ONE TABLET BY MOUTH EVERY DAY   desvenlafaxine  (PRISTIQ ) 50 MG 24 hr tablet Take 50 mg by mouth daily.   dexamethasone  (DECADRON ) 4 MG tablet TAKE ONE TABLET BY MOUTH 2 TIMES A DAY FOR 5 DAYS AFTER CHEMOTHERAPY   diphenoxylate -atropine  (LOMOTIL ) 2.5-0.025 MG tablet TAKE TWO TABLETS BY MOUTH FOUR TIMES A DAY AS NEEDED FOR DIARRHEA OR LOOSE STOOLS   folic acid  (FOLVITE ) 1 MG tablet Take 1 tablet (1 mg total) by mouth daily.   labetalol  (NORMODYNE ) 100 MG tablet Take 0.5 tablets (50 mg total) by mouth 2 (two) times daily.   loratadine  (CLARITIN ) 10 MG tablet TAKE ONE TABLET BY MOUTH EVERY DAY   LORazepam  (ATIVAN ) 1 MG tablet Take 1 tablet (1 mg total) by mouth every 6 (six) hours as needed for anxiety (nausea).   magic mouthwash (lidocaine , diphenhydrAMINE , alum & mag hydroxide) suspension Take 5 mLs by mouth every 3 (three) hours as needed. Swish and spit; for throat/mouth pain   naloxone  (NARCAN ) nasal spray 4 mg/0.1 mL One spray in nostril as needed, may repeat every 2 to 3 minutes until medical assistance becomes available   nitroGLYCERIN  (NITROSTAT ) 0.4 MG SL tablet Place 0.4 mg under the tongue every 5 (five) minutes as needed for chest pain.   nystatin  (MYCOSTATIN /NYSTOP ) powder Apply 1 Application topically 2 (two) times daily.   omeprazole  (PRILOSEC) 40 MG capsule Take 1 capsule (40 mg total) by mouth at bedtime. (Patient taking differently: Take 40 mg by mouth in the morning and at bedtime.)   ondansetron  (ZOFRAN ) 8 MG tablet Take 1 tablet (8 mg total) by mouth every 8 (eight) hours as needed for nausea or vomiting.   Oxycodone  HCl 10 MG TABS TAKE ONE TABLET BY MOUTH EVERY 4 HOURS AS NEEDED FOR PAIN   Potassium 99 MG TABS Take 2 tablets by  mouth 2 (two) times daily. GUMMIES   prochlorperazine  (COMPAZINE ) 10 MG tablet TAKE ONE TABLET BY MOUTH EVERY 6 HOURS AS NEEDED FOR NAUSEA/VOMITING   Vibegron (GEMTESA) 75 MG TABS Take 1 tablet by mouth daily.   [DISCONTINUED] labetalol  (NORMODYNE ) 100 MG tablet Take 1 tablet (100 mg total) by mouth 2 (two) times daily.   Current Facility-Administered Medications for the 04/17/24 encounter (Office Visit) with Brandy Kabat, DO  Medication   technetium sestamibi generic (CARDIOLITE ) injection 10.5 millicurie     Allergies:   Xgeva  [denosumab ] and Reglan  [metoclopramide ]   Social History   Socioeconomic History   Marital status: Married    Spouse name: Not on file   Number of children: Not on file   Years of education: Not on file  Highest education level: Not on file  Occupational History   Not on file  Tobacco Use   Smoking status: Former    Current packs/day: 0.00    Types: Cigarettes    Quit date: 2004    Years since quitting: 21.6   Smokeless tobacco: Never  Vaping Use   Vaping status: Never Used  Substance and Sexual Activity   Alcohol use: Not Currently   Drug use: Never   Sexual activity: Not on file  Other Topics Concern   Not on file  Social History Narrative   Not on file   Social Drivers of Health   Financial Resource Strain: Not on file  Food Insecurity: Not on file  Transportation Needs: Not on file  Physical Activity: Not on file  Stress: Not on file  Social Connections: Not on file     Family History: The patient's family history includes Diabetes in her father; Hyperlipidemia in her mother; Stroke in her father. There is no history of Colon cancer, Colon polyps, Esophageal cancer, Rectal cancer, or Stomach cancer.  ROS:   Review of Systems  Constitution: Negative for decreased appetite, fever and weight gain.  HENT: Negative for congestion, ear discharge, hoarse voice and sore throat.   Eyes: Negative for discharge, redness, vision loss in right  eye and visual halos.  Cardiovascular: Negative for chest pain, dyspnea on exertion, leg swelling, orthopnea and palpitations.  Respiratory: Negative for cough, hemoptysis, shortness of breath and snoring.   Endocrine: Negative for heat intolerance and polyphagia.  Hematologic/Lymphatic: Negative for bleeding problem. Does not bruise/bleed easily.  Skin: Negative for flushing, nail changes, rash and suspicious lesions.  Musculoskeletal: Negative for arthritis, joint pain, muscle cramps, myalgias, neck pain and stiffness.  Gastrointestinal: Negative for abdominal pain, bowel incontinence, diarrhea and excessive appetite.  Genitourinary: Negative for decreased libido, genital sores and incomplete emptying.  Neurological: Negative for brief paralysis, focal weakness, headaches and loss of balance.  Psychiatric/Behavioral: Negative for altered mental status, depression and suicidal ideas.  Allergic/Immunologic: Negative for HIV exposure and persistent infections.    EKGs/Labs/Other Studies Reviewed:    The following studies were reviewed today:   EKG:  None today   Recent Labs: 07/10/2023: TSH 3.062 12/25/2023: Magnesium  1.9 04/16/2024: ALT 9; BUN 20; Creatinine 0.57; Hemoglobin 10.9; Platelet Count 255; Potassium 3.4; Sodium 141  Recent Lipid Panel    Component Value Date/Time   CHOL 130 01/08/2023 0000   TRIG 107 01/08/2023 0000   HDL 61 01/08/2023 0000   LDLCALC 48 01/08/2023 0000    Physical Exam:    VS:  BP 90/60 (BP Location: Right Arm, Patient Position: Sitting, Cuff Size: Normal)   Pulse 98   Ht 4' 8 (1.422 m)   Wt 134 lb 8 oz (61 kg)   LMP 08/18/2013   SpO2 94%   BMI 30.15 kg/m     Wt Readings from Last 3 Encounters:  04/17/24 134 lb 8 oz (61 kg)  04/16/24 132 lb 9.6 oz (60.1 kg)  03/20/24 129 lb 9.6 oz (58.8 kg)     GEN: Well nourished, well developed in no acute distress HEENT: Normal NECK: No JVD; No carotid bruits LYMPHATICS: No lymphadenopathy CARDIAC:  S1S2 noted,RRR, no murmurs, rubs, gallops RESPIRATORY:  Clear to auscultation without rales, wheezing or rhonchi  ABDOMEN: Soft, non-tender, non-distended, +bowel sounds, no guarding. EXTREMITIES: No edema, No cyanosis, no clubbing MUSCULOSKELETAL:  No deformity  SKIN: Warm and dry NEUROLOGIC:  Alert and oriented x 3, non-focal PSYCHIATRIC:  Normal affect, good insight  ASSESSMENT:    1. Essential hypertension   2. Chemotherapy follow-up examination    PLAN:     Blood pressure today in the office on the lower side we will cut back on her to 50 twice a day today continue to be this low we will just stop it.  Hyperlipidemia most recent lipid profile is at goal -Continue Lipitor.  Will repeat her echocardiogram in 6 months given she is still actively receiving chemo   The patient is in agreement with the above plan. The patient left the office in stable condition.  The patient will follow up in 1 months.   Medication Adjustments/Labs and Tests Ordered: Current medicines are reviewed at length with the patient today.  Concerns regarding medicines are outlined above.  Orders Placed This Encounter  Procedures   EKG 12-Lead   ECHOCARDIOGRAM COMPLETE   Meds ordered this encounter  Medications   labetalol  (NORMODYNE ) 100 MG tablet    Sig: Take 0.5 tablets (50 mg total) by mouth 2 (two) times daily.    Dispense:  90 tablet    Refill:  3    Patient Instructions  Medication Instructions:  Your physician has recommended you make the following change in your medication:  DECREASE: Labetalol  50 mg twice daily.  *If you need a refill on your cardiac medications before your next appointment, please call your pharmacy*   Testing/Procedures: Your physician has requested that you have an echocardiogram - [place]. Echocardiography is a painless test that uses sound waves to create images of your heart. It provides your doctor with information about the size and shape of your heart and  how well your heart's chambers and valves are working. This procedure takes approximately one hour. There are no restrictions for this procedure. Please do NOT wear cologne, perfume, aftershave, or lotions (deodorant is allowed). Please arrive 15 minutes prior to your appointment time.  Please note: We ask at that you not bring children with you during ultrasound (echo/ vascular) testing. Due to room size and safety concerns, children are not allowed in the ultrasound rooms during exams. Our front office staff cannot provide observation of children in our lobby area while testing is being conducted. An adult accompanying a patient to their appointment will only be allowed in the ultrasound room at the discretion of the ultrasound technician under special circumstances. We apologize for any inconvenience.   Follow-Up: At [facility], you and your health needs are our priority.  As part of our continuing mission to provide you with exceptional heart care, our providers are all part of one team.  This team includes your primary Cardiologist (physician) and Advanced Practice Providers or APPs (Physician Assistants and Nurse Practitioners) who all work together to provide you with the care you need, when you need it.  Your next appointment:   1 year(s)  Provider:   Jennine Peddy, DO      Adopting a Healthy Lifestyle.  Know what a healthy weight is for you (roughly BMI <25) and aim to maintain this   Aim for 7+ servings of fruits and vegetables daily   65-80+ fluid ounces of water  or unsweet tea for healthy kidneys   Limit to max 1 drink of alcohol per day; avoid smoking/tobacco   Limit animal fats in diet for cholesterol and heart health - choose grass fed whenever available   Avoid highly processed foods, and foods high in saturated/trans fats   Aim for low stress -  take time to unwind and care for your mental health   Aim for 150 min of moderate intensity exercise weekly for  heart health, and weights twice weekly for bone health   Aim for 7-9 hours of sleep daily   When it comes to diets, agreement about the perfect plan isnt easy to find, even among the experts. Experts at the [facility] [employer] developed an idea known as the Healthy Eating Plate. Just imagine a plate divided into logical, healthy portions.   The emphasis is on diet quality:   Load up on vegetables and fruits - one-half of your plate: Aim for color and variety, and remember that potatoes dont count.   Go for whole grains - one-quarter of your plate: Whole wheat, barley, wheat berries, quinoa, oats, brown rice, and foods made with them. If you want pasta, go with whole wheat pasta.   Protein power - one-quarter of your plate: Fish, chicken, beans, and nuts are all healthy, versatile protein sources. Limit red meat.   The diet, however, does go beyond the plate, offering a few other suggestions.   Use healthy plant oils, such as olive, canola, soy, corn, sunflower and peanut. Check the labels, and avoid partially hydrogenated oil, which have unhealthy trans fats.   If youre thirsty, drink water . Coffee and tea are good in moderation, but skip sugary drinks and limit milk and dairy products to one or two daily servings.   The type of carbohydrate in the diet is more important than the amount. Some sources of carbohydrates, such as vegetables, fruits, whole grains, and beans-are healthier than others.   Finally, stay active  Signed, Evita Merida, DO  04/18/2024 2:40 PM    [facility]

## 2024-04-20 ENCOUNTER — Encounter: Payer: Self-pay | Admitting: Oncology

## 2024-04-22 ENCOUNTER — Ambulatory Visit: Admitting: Podiatry

## 2024-04-22 DIAGNOSIS — L602 Onychogryphosis: Secondary | ICD-10-CM

## 2024-04-22 DIAGNOSIS — M79675 Pain in left toe(s): Secondary | ICD-10-CM

## 2024-04-22 DIAGNOSIS — M79674 Pain in right toe(s): Secondary | ICD-10-CM

## 2024-04-22 NOTE — Progress Notes (Unsigned)
 Subjective:  Patient ID: Anna [name], female    DOB: [birth year],  MRN: [id number]  Anna [name] presents to clinic today for:  Chief Complaint  Patient presents with   Fungal nail Check    Fungal nail check today. She thinks they are looking good. Nails look brittle. Mom is asking a bunch of questions about nails. Why is it so shot, are they breaking? Not using anything as it came back not fungus. Will pick up the opti nail or the urea 42 .  A1c 6.3 in Sept ASA    Patient notes nails are thick, discolored, elongated and painful in shoegear when trying to ambulate.  Her fungal nail culture came back negative for nail fungus.  They do still have concern of increased thickness and discoloration to the nails.  They have not been treating with anything since last seen.  She is still undergoing chemotherapy.  PCP is Pia Kerney SHAUNNA, MD.  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/2023   Anxiety    Bipolar disorder (HCC) 11/29/2020   Blood transfusion without reported diagnosis    Breast cancer (HCC)    Cancer (HCC)    Cardiac murmur    Chest pain    Chronic fatigue syndrome 11/29/2020   Depression    Essential hypertension 11/29/2020   Hepatic steatosis determined by biopsy of liver 07/04/2020   HLD (hyperlipidemia)    Hypertension    Hypomagnesemia 03/26/2023   Hypothyroidism 11/29/2020   Insomnia 11/29/2020   Iron  deficiency anemia 06/05/2022   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   Migraine 11/29/2020   Mild intermittent asthma 11/29/2020   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   Mixed hyperlipidemia 11/29/2020   Obesity due to excess calories    Osteopenia after menopause 07/04/2020   Other long term (current)  drug therapy 11/29/2020   Other vitamin B12 deficiency anemias 11/29/2020   Personal history of malignant neoplasm of breast 11/29/2020   Pre-diabetes    Renal cyst, acquired, right 07/04/2020   14.5 cm in October 2021   Retrocalcaneal bursitis (back of heel), left 04/22/2018   Seasonal allergic rhinitis 11/29/2020   Tightness of heel cord, left 04/22/2018   Tremor 08/02/2023   Type 2 diabetes mellitus without complications (HCC) 11/29/2020   Vitamin D  deficiency 11/29/2020   Past Surgical History:  Procedure Laterality Date   CESAREAN SECTION     COLONOSCOPY     IR BONE TUMOR(S)RF ABLATION  05/29/2022   IR BONE TUMOR(S)RF ABLATION  05/29/2022   IR KYPHO EA ADDL LEVEL THORACIC OR LUMBAR  05/29/2022   IR KYPHO THORACIC WITH BONE BIOPSY  05/29/2022   IR RADIOLOGIST EVAL & MGMT  05/22/2022   IR RADIOLOGIST EVAL & MGMT  06/12/2022   LIVER BIOPSY  01/2019   lumpectomy  2014   Left Breast   PORT-A-CATH REMOVAL     PORTACATH PLACEMENT  08/28/2013   STOMACH SURGERY     Tummy Tuck   TOTAL HIP ARTHROPLASTY Right 07/04/2021   Procedure: TOTAL HIP ARTHROPLASTY;  Surgeon: Ernie Cough, MD;  Location: [facility];  Service: Orthopedics;  Laterality: Right;  90   WISDOM TOOTH EXTRACTION     Allergies  Allergen Reactions   Xgeva  [Denosumab ] Other (See Comments)  Possible ONJ (09/13/23)   Reglan  [Metoclopramide ] Other (See Comments)    Made patient feel really funny when taking this medication     Review of Systems: Negative except as noted in the HPI.  Objective:  Anna [name] is a pleasant 53 y.o. female in NAD. AAO x 3.  Vascular Examination: Capillary refill time is 3-5 seconds to toes bilateral. Palpable pedal pulses b/l LE. Digital hair present b/l.  Skin temperature gradient WNL b/l. No varicosities b/l. No cyanosis noted b/l.   Dermatological Examination: Pedal skin with decreased turgor, texture and tone b/l. No open wounds. No interdigital macerations b/l. Toenails  x10 are 3mm thick, discolored, dystrophic with subungual debris. There is pain with compression of the nail plates.  They are elongated x10  Assessment/Plan: 1. Onychogryphosis   2. Pain in toes of both feet    The toenails were sharply debrided x10 with sterile nail nippers and a power debriding burr to decrease bulk/thickness and length.    Recommended Kera nail urea nail gel 47% to apply to the toenails to help with the thickness and discoloration.  They can apply this once daily.  They can purchase this on [employer].  Follow-up in 3 months   Haeley Fordham D. Cerra Eisenhower, DPM, FACFAS [facility]     [address] [place], [place] [address]                Office [phone]  Fax [phone]

## 2024-04-23 ENCOUNTER — Other Ambulatory Visit: Payer: Self-pay

## 2024-04-28 ENCOUNTER — Other Ambulatory Visit: Payer: Self-pay | Admitting: Oncology

## 2024-04-28 DIAGNOSIS — D529 Folate deficiency anemia, unspecified: Secondary | ICD-10-CM

## 2024-05-06 ENCOUNTER — Other Ambulatory Visit: Payer: Self-pay | Admitting: Cardiology

## 2024-05-15 ENCOUNTER — Inpatient Hospital Stay: Attending: Hematology and Oncology

## 2024-05-15 ENCOUNTER — Other Ambulatory Visit: Payer: Self-pay | Admitting: Pharmacist

## 2024-05-15 ENCOUNTER — Inpatient Hospital Stay (HOSPITAL_BASED_OUTPATIENT_CLINIC_OR_DEPARTMENT_OTHER): Admitting: Hematology and Oncology

## 2024-05-15 ENCOUNTER — Other Ambulatory Visit: Payer: Self-pay

## 2024-05-15 ENCOUNTER — Inpatient Hospital Stay

## 2024-05-15 ENCOUNTER — Encounter: Payer: Self-pay | Admitting: Oncology

## 2024-05-15 VITALS — BP 93/78 | HR 94 | Temp 98.7°F | Resp 18 | Ht <= 58 in | Wt 128.2 lb

## 2024-05-15 DIAGNOSIS — Z5112 Encounter for antineoplastic immunotherapy: Secondary | ICD-10-CM | POA: Diagnosis not present

## 2024-05-15 DIAGNOSIS — Z5111 Encounter for antineoplastic chemotherapy: Secondary | ICD-10-CM | POA: Diagnosis present

## 2024-05-15 DIAGNOSIS — C50412 Malignant neoplasm of upper-outer quadrant of left female breast: Secondary | ICD-10-CM | POA: Diagnosis not present

## 2024-05-15 DIAGNOSIS — D509 Iron deficiency anemia, unspecified: Secondary | ICD-10-CM | POA: Insufficient documentation

## 2024-05-15 DIAGNOSIS — C7951 Secondary malignant neoplasm of bone: Secondary | ICD-10-CM

## 2024-05-15 DIAGNOSIS — Z1731 Human epidermal growth factor receptor 2 positive status: Secondary | ICD-10-CM | POA: Insufficient documentation

## 2024-05-15 DIAGNOSIS — M87180 Osteonecrosis due to drugs, jaw: Secondary | ICD-10-CM | POA: Diagnosis not present

## 2024-05-15 DIAGNOSIS — Z170 Estrogen receptor positive status [ER+]: Secondary | ICD-10-CM | POA: Diagnosis not present

## 2024-05-15 DIAGNOSIS — Z23 Encounter for immunization: Secondary | ICD-10-CM | POA: Insufficient documentation

## 2024-05-15 LAB — CBC WITH DIFFERENTIAL (CANCER CENTER ONLY)
Abs Immature Granulocytes: 0.04 K/uL (ref 0.00–0.07)
Basophils Absolute: 0 K/uL (ref 0.0–0.1)
Basophils Relative: 0 %
Eosinophils Absolute: 0.4 K/uL (ref 0.0–0.5)
Eosinophils Relative: 6 %
HCT: 35.4 % — ABNORMAL LOW (ref 36.0–46.0)
Hemoglobin: 11 g/dL — ABNORMAL LOW (ref 12.0–15.0)
Immature Granulocytes: 1 %
Lymphocytes Relative: 13 %
Lymphs Abs: 0.8 K/uL (ref 0.7–4.0)
MCH: 26.1 pg (ref 26.0–34.0)
MCHC: 31.1 g/dL (ref 30.0–36.0)
MCV: 84.1 fL (ref 80.0–100.0)
Monocytes Absolute: 0.7 K/uL (ref 0.1–1.0)
Monocytes Relative: 11 %
Neutro Abs: 4.3 K/uL (ref 1.7–7.7)
Neutrophils Relative %: 69 %
Platelet Count: 277 K/uL (ref 150–400)
RBC: 4.21 MIL/uL (ref 3.87–5.11)
RDW: 16.9 % — ABNORMAL HIGH (ref 11.5–15.5)
WBC Count: 6.2 K/uL (ref 4.0–10.5)
nRBC: 0 % (ref 0.0–0.2)

## 2024-05-15 LAB — CMP (CANCER CENTER ONLY)
ALT: 9 U/L (ref 0–44)
AST: 19 U/L (ref 15–41)
Albumin: 3.4 g/dL — ABNORMAL LOW (ref 3.5–5.0)
Alkaline Phosphatase: 117 U/L (ref 38–126)
Anion gap: 11 (ref 5–15)
BUN: 16 mg/dL (ref 6–20)
CO2: 29 mmol/L (ref 22–32)
Calcium: 11.7 mg/dL — ABNORMAL HIGH (ref 8.9–10.3)
Chloride: 99 mmol/L (ref 98–111)
Creatinine: 0.73 mg/dL (ref 0.44–1.00)
GFR, Estimated: >60 mL/min (ref >60)
Glucose, Bld: 162 mg/dL — ABNORMAL HIGH (ref 70–99)
Potassium: 3.6 mmol/L (ref 3.5–5.1)
Sodium: 139 mmol/L (ref 135–145)
Total Bilirubin: 0.2 mg/dL (ref 0.0–1.2)
Total Protein: 6 g/dL — ABNORMAL LOW (ref 6.5–8.1)

## 2024-05-15 LAB — CALCIUM: Calcium: 10.7 mg/dL — ABNORMAL HIGH (ref 8.9–10.3)

## 2024-05-15 MED ORDER — DEXTROSE 5 % IV SOLN: Freq: Once | INTRAVENOUS | Status: AC

## 2024-05-15 MED ORDER — DEXAMETHASONE SODIUM PHOSPHATE 10 MG/ML IJ SOLN
10.0000 mg | Freq: Once | INTRAMUSCULAR | Status: AC
  Administered 2024-05-15: 10 mg via INTRAVENOUS
  Filled 2024-05-15: qty 1

## 2024-05-15 MED ORDER — ACETAMINOPHEN 325 MG PO TABS
650.0000 mg | ORAL_TABLET | Freq: Once | ORAL | Status: DC
  Filled 2024-05-15: qty 2

## 2024-05-15 MED ORDER — FAM-TRASTUZUMAB DERUXTECAN-NXKI CHEMO 100 MG IV SOLR
5.3000 mg/kg | Freq: Once | INTRAVENOUS | Status: AC
  Administered 2024-05-15: 300 mg via INTRAVENOUS
  Filled 2024-05-15: qty 15

## 2024-05-15 MED ORDER — FULVESTRANT 250 MG/5ML IM SOSY
500.0000 mg | PREFILLED_SYRINGE | Freq: Once | INTRAMUSCULAR | Status: AC
  Administered 2024-05-15: 500 mg via INTRAMUSCULAR
  Filled 2024-05-15: qty 10

## 2024-05-15 MED ORDER — DIPHENHYDRAMINE HCL 25 MG PO CAPS
50.0000 mg | ORAL_CAPSULE | Freq: Once | ORAL | Status: DC
  Filled 2024-05-15: qty 2

## 2024-05-15 MED ORDER — SODIUM CHLORIDE 0.9 % IV SOLN: INTRAVENOUS | Status: DC

## 2024-05-15 MED ORDER — PALONOSETRON HCL INJECTION 0.25 MG/5ML
0.2500 mg | Freq: Once | INTRAVENOUS | Status: AC
  Administered 2024-05-15: 0.25 mg via INTRAVENOUS
  Filled 2024-05-15: qty 5

## 2024-05-15 MED ORDER — SODIUM CHLORIDE 0.9 % IV SOLN
150.0000 mg | Freq: Once | INTRAVENOUS | Status: AC
  Administered 2024-05-15: 150 mg via INTRAVENOUS
  Filled 2024-05-15: qty 150

## 2024-05-15 NOTE — Progress Notes (Signed)
 [facility]  [address] [place],  [place]  [address] [phone] Clinic Day: 04/16/2024  Referring physician: Pia Kerney SQUIBB, MD  ASSESSMENT & PLAN:  Assessment: Malignant neoplasm metastatic to bone [facility]) Bone metastases diagnosed in November 2022, with multiple marrow replacing lesions within the proximal right femur within the ischium as well as the inferior rami. 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 HER2 was also positive at 3+, which was negative on her original [name]. Ki67 was 60%. PET imaging in December revealed dominant finding of intensely hypermetabolic aggressive skeletal metastasis involving the calvarium, sternum, thoracic and lumbar spine, and pelvis, with potential pathologic fracture of the right femur with internal fixation. There was soft tissue extension into the anterior mediastinum associated with the manubrial metastasis. Multiple spinal vertebral body lesions had cortical destruction along the posterior margin. She started monthly Xgeva  in December, and completed radiation therapy. She continued with Herceptin /Perjeta  until she had progression of disease. In January 2024, she developed increased pain of the left hip. She was found to have innumerable metastatic lesions throughout the pelvis and proximal femur. There is a non displaced pathologic fracture through a large lesion in the superior left acetabulum. She was referred to an orthopedic oncologist at [facility] and they recommended radiation and no surgery. She received her palliative radiation.    We then switched her to Enhertu  chemotherapy in March of 2024, but she has had significant toxicities of nausea, vomiting, diarrhea, and hypokalemia.  She was also placed on hormonal therapy with Faslodex  injections.  Repeat CT imaging in July revealed decreased soft tissue thickening around the left subclavian structures and thoracic inlet.  Left lateral breast mass was stable.   Widespread mixed lytic and sclerotic osseous metastatic lesions were stable.  There was unchanged severe elevation of the left hemidiaphragm. CT neck in October to evaluate for possible soft tissue mass and painful swallowing did not reveal any soft tissue masses.  The extensive mixed lytic and sclerotic skeletal metastases were much more pronounced in the visible upper thorax than in the cervical spine and were felt to be grossly stable from July 2024. CT chest, abdomen and pelvis in November revealed stable bony disease without evidence of new/progressive disease.  Due to the development of osteonecrosis of the jaw, denosumab  was discontinued 2025   She completed 18 cycles of Enhertu , which was then placed on hold due to severe anemia.  She required multiple units of blood. She was found to be iron  deficient and received IV iron  in the form of Venofer  for 5 doses.  We resumed Enhertu  in April.  She has continued fulvestrant  monthly.  In May, patient requested she receive Enhertu  every 4 weeks.   She was referred to Dr. Charlanne to consider evaluation of the iron  deficiency.  He will hold off on a colonoscopy. He performed an EGD 03/13/2024 which revealed both gastric and duodenal ulcers as well as esophageal stricture.   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  She was treated with lumpectomy followed by adjuvant chemotherapy and radiation therapy. She was on adjuvant hormonal therapy with tamoxifen 20 mg daily from October 2015 to May 2020, but due to elevation of the liver transaminases was switched to anastrazole.  She then developed bone metastasis in November 2022 so has been receiving chemotherapy. She had skin thickening on her last mammogram, but a nodule in  the left breast was stable on CT imaging. The skin thickening is felt to possibly due to radiation, as her exam remained  stable. Continued mammograms are not recommended.   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 She developed pain of the right jaw in February 2025.  Panorex x-rays revealed an abnormal area in the right lower jaw consistent with osteonecrosis.  Xgeva  was discontinued.  She was treated with Augmentin  in February.  She has been working closely with her dentist. She has had 2 fillings done, but also may need root canals.  She sees him again on April 8.  She had swelling and erythema of the left jaw earlier this month, so was placed on amoxicillin  4 times daily, with resolution of the swelling and erythema.  The redness and swelling have resolved, but she states she still has some mild tenderness of the anterior left jaw.  She has had recurrent swelling and erythema and requires intermittent courses of Amoxicillin .    Iron  deficiency anemia New iron  deficiency anemia diagnosed in March of 2025, when she presented with acute worsening of her anemia. She required transfusion of 3 units of PRBC's. She then received IV iron  in the form of Venofer  for 5 doses. She is taking oral iron  now.   Her hemoglobin has slightly improved, which may be due to hemoconcentration.  She was referred to Dr. Charlanne to consider evaluation of the iron  deficiency. He will hold off on a colonoscopy. He performed an EGD 03/13/2024 which revealed both gastric and duodenal ulcers as well as esophageal stricture. He did recommend increasing the omeprazole  to 40 mg BID and added Carafate .  Hypercalcemia Calcium  today is 11.7. She noted previously that she had significantly increased her oral intake of calcium  and was told to decrease her intake. We will plan for aggressive IVF today as she is unable to have biphosphonates due to history of osteonecrosis of the jaw.   Plan: We will plan IVF today and repeat labs tomorrow.   I provided 20 minutes of face-to-face time during this encounter and > 50% was  spent counseling as documented under my assessment and plan.   Eleanor Bach, FNP- [name] - A DEPT OF [facility] [address] [place] [address] Dept: [phone] Dept Fax: [phone]   No orders of the defined types were placed in this encounter.   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  Current Treatment: Enhertu  every 4 weeks  HISTORY OF PRESENT ILLNESS:   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 (HCC)   07/23/2013 [name] Staging   Staging form: Breast, AJCC 7th Edition - Clinical stage from 07/23/2013: Stage IIB (T2, N1, M0) - Signed by Cornelius Wanda VEAR, MD on 07/04/2020 Prognostic indicators: Pos LVI   09/22/2021 - 04/06/2022 Chemotherapy   Patient is on Treatment Plan : BREAST  Trastuzumab  + Pertuzumab  q21d      09/22/2021 - 09/21/2022 Chemotherapy   Patient is on Treatment Plan : BREAST Trastuzumab  + Pertuzumab  q21d     10/12/2022 -  Chemotherapy   Patient is on Treatment Plan : BREAST METASTATIC Fam-Trastuzumab Deruxtecan-nxki  (Enhertu ) (5.4) q28d (changed from q21d on 12/25/23)     Malignant neoplasm metastatic to bone (HCC)  06/28/2021 Initial Diagnosis  Bone metastases (HCC)   09/22/2021 - 04/06/2022 Chemotherapy   Patient is on Treatment Plan : BREAST  Trastuzumab  + Pertuzumab  q21d      09/22/2021 - 09/21/2022 Chemotherapy   Patient is on Treatment Plan : BREAST Trastuzumab  + Pertuzumab  q21d     05/11/2022 Imaging   CT chest, abdomen and pelvis:  IMPRESSION:  1. Widespread bony metastatic disease shows increased sclerosis  since previous imaging. This is likely related to response to  therapy in the interval. There is a single lesion along the LEFT  iliac which shows continued lytic change and warrants  attention on  follow-up  2. Interval development of pathologic fractures at in the upper  thoracic spine and at the thoracolumbar junction with associated  kyphosis.  3. No signs of solid organ or nodal metastatic disease.  4. Marked elevation of the LEFT hemidiaphragm as on the prior PET.  5. Aortic atherosclerosis.    10/12/2022 -  Chemotherapy   Patient is on Treatment Plan : BREAST METASTATIC Fam-Trastuzumab Deruxtecan-nxki  (Enhertu ) (5.4) q28d (changed from q21d on 12/25/23)       INTERVAL HISTORY:  Anna [name]. Patient states that she feels well and complains of lower back pain rated 4/10. She visited the dentist, where she was diagnosed with a tooth infection. She was prescribed Augmentin , but has not yet started this treatment. I encouraged her to do so since it should also help her ONJ. She had an upper GI endoscopy performed on 03/13/2024 by Dr. Charlanne which revealed an esophageal stricture, one 6 mm non-bleeding cratered gastric ulcer with surrounding atrophic gastritis, and one 12 mm non-bleeding cratered duodenal ulcer . I explained these results to the patient and her husband and they understand. Dr. Charlanne recommended she increase her 40 mg omeprazole  to BID and added Carafate  1 gram four times daily for the ulcers and I agree with this. I reminded her to take Carafate  separately from her other medications as it coats the stomach lining and can prevent absorption. Based on her past CBC results, I suspect that she had a significant amount of bleeding from the ulcers at the beginning of the year that has since discontinued. She is currently taking 1 Aspirin a day. I cautioned her to avoid NSAIDs as these can cause gastritis and ulcers. I recommended she continue oxycodone  for her bone pain.  We will see her back in 4 weeks with CBC, CMP, iron , TIBC, and ferritin for her next cycle of  Enhertu . She denies fever, chills, night sweats, or other signs of infection. She denies cardiorespiratory and gastrointestinal issues. She  denies pain. Her appetite is good and Her weight has been stable at 129lb 9.6oz.  REVIEW OF SYSTEMS:   Review of Systems  Constitutional:  Positive for fatigue. Negative for appetite change, chills, fever and unexpected weight change.  HENT:   Negative for lump/mass, mouth sores, sore throat and trouble swallowing.        Jaw swelling and erythema likely related to her ONJ  Respiratory:  Negative for cough and shortness of breath.   Cardiovascular:  Negative for chest pain and leg swelling.  Gastrointestinal:  Positive for constipation. Negative for abdominal pain, diarrhea and vomiting. Nausea: some. Genitourinary:  Negative for bladder incontinence, difficulty urinating, dysuria, frequency and hematuria.   Musculoskeletal:  Positive for back pain (Lower back 4/10) and neck pain. Negative for arthralgias, gait problem and myalgias.  Skin:  Positive for wound (Bilateral leg ulcers, newly on the left now healing). Negative for rash.  Neurological:  Negative for dizziness, extremity weakness, gait problem, headaches, light-headedness and numbness.  Hematological:  Negative for adenopathy. Does not bruise/bleed easily.  Psychiatric/Behavioral:  Negative for depression and sleep disturbance. The patient is not nervous/anxious.     VITALS:   Last menstrual period 08/18/2013.  Wt Readings from Last 3 Encounters:  04/17/24 134 lb 8 oz (61 kg)  04/16/24 132 lb 9.6 oz (60.1 kg)  03/20/24 129 lb 9.6 oz (58.8 kg)    There is no height or weight on file to calculate BMI.  Performance status (ECOG): 1 - Symptomatic but completely ambulatory    PHYSICAL EXAM:   Physical Exam Vitals and nursing note reviewed.  Constitutional:      General: She is not in acute distress.    Appearance: She is normal weight. She is not ill-appearing.  HENT:     Head:  Normocephalic and atraumatic.     Comments: She has some firm swelling in the submental area    Right Ear: Tympanic membrane, ear canal and external ear normal.     Left Ear: Tympanic membrane, ear canal and external ear normal.     Nose: Nose normal.     Mouth/Throat:     Mouth: Mucous membranes are moist.     Pharynx: Oropharynx is clear. No oropharyngeal exudate or posterior oropharyngeal erythema.  Eyes:     General: No scleral icterus.    Extraocular Movements: Extraocular movements intact.     Conjunctiva/sclera: Conjunctivae normal.     Pupils: Pupils are equal, round, and reactive to light.  Cardiovascular:     Rate and Rhythm: Normal rate and regular rhythm.     Pulses: Normal pulses.     Heart sounds: Normal heart sounds. No murmur heard.    No friction rub. No gallop.  Pulmonary:     Effort: Pulmonary effort is normal.     Breath sounds: Normal breath sounds. No wheezing, rhonchi or rales.  Chest:     Comments: Breast exam is deferred Abdominal:     General: Bowel sounds are normal. There is no distension.     Palpations: Abdomen is soft. There is no hepatomegaly, splenomegaly or mass.     Tenderness: There is no abdominal tenderness.  Musculoskeletal:        General: Normal range of motion.     Cervical back: Normal range of motion and neck supple. No tenderness.     Right lower leg: Edema (mild) present.     Left lower leg: Edema (mild) present.  Lymphadenopathy:     Cervical: No cervical adenopathy.     Upper Body:     Right upper body: No supraclavicular or axillary adenopathy.     Left upper body: No supraclavicular or axillary adenopathy.     Lower Body: No right inguinal adenopathy. No left inguinal adenopathy.  Skin:    General: Skin is warm and dry.     Coloration: Skin is not jaundiced.     Findings: No rash.  Neurological:     General: No focal deficit present.     Mental Status: She is alert and oriented to person, place, and time. Mental status is  at baseline.     Cranial Nerves: No cranial nerve deficit.  Psychiatric:        Mood and Affect: Mood normal.        Behavior: Behavior normal.  Thought Content: Thought content normal.        Judgment: Judgment normal.     LABS:      Latest Ref Rng & Units 04/16/2024   10:08 AM 03/20/2024    9:26 AM 02/21/2024    8:08 AM  CBC  WBC 4.0 - 10.5 K/uL 4.9  5.7  6.2   Hemoglobin 12.0 - 15.0 g/dL 89.0  89.8  88.9   Hematocrit 36.0 - 46.0 % 35.7  33.4  35.3   Platelets 150 - 400 K/uL 255  304  290       Latest Ref Rng & Units 04/16/2024   10:08 AM 03/20/2024    9:26 AM 02/21/2024    8:08 AM  CMP  Glucose 70 - 99 mg/dL 842  865  895   BUN 6 - 20 mg/dL 20  25  25    Creatinine 0.44 - 1.00 mg/dL 9.42  9.44  9.45   Sodium 135 - 145 mmol/L 141  140  139   Potassium 3.5 - 5.1 mmol/L 3.4  3.8  4.1   Chloride 98 - 111 mmol/L 102  103  103   CO2 22 - 32 mmol/L 29  27  28    Calcium  8.9 - 10.3 mg/dL 9.7  9.6  9.5   Total Protein 6.5 - 8.1 g/dL 5.8  5.8  5.9   Total Bilirubin 0.0 - 1.2 mg/dL <9.7  <9.7  0.2   Alkaline Phos 38 - 126 U/L 85  79  94   AST 15 - 41 U/L 15  19  19    ALT 0 - 44 U/L 9  7  16     Lab Results  Component Value Date   CEA1 2.2 07/20/2021   /  CEA  Date Value Ref Range Status  07/20/2021 2.2 0.0 - 4.7 ng/mL Final    Comment:    (NOTE)                             Nonsmokers          <3.9                             Smokers             <5.6 [employer] Electrochemiluminescence Immunoassay (ECLIA) Values obtained with different assay methods or kits cannot be used interchangeably.  Results cannot be interpreted as absolute evidence of the presence or absence of malignant disease. Performed At: [facility] [address] [place], [place] [id number] Jennette Shorter MD Ey:[phone]     Lab Results  Component Value Date   TIBC 276 04/16/2024   TIBC 269 01/24/2024   TIBC 251 10/25/2023   FERRITIN 282 04/16/2024   FERRITIN 326 (H) 01/24/2024    FERRITIN 129 10/25/2023   IRONPCTSAT 13 04/16/2024   IRONPCTSAT 13 01/24/2024   IRONPCTSAT 8 (L) 10/25/2023   Lab Results  Component Value Date   LDH 143 10/25/2023   LDH 171 07/10/2023    STUDIES:   No results found.    EXAM: 09/05/2023 CT CHEST, ABDOMEN, AND PELVIS WITH CONTRAST IMPRESSION: 1. Unchanged size of the mass in the left breast with overlying skin thickening. 2. No significant interval change in the widespread osseous metastatic disease involving the axial and proximal appendicular skeleton. 3. Similar soft tissue thickening about the left subclavian structures and thoracic inlet, possibly reflecting posttreatment  change. Suggest continued attention on follow-up imaging. 4. No evidence of new or progressive disease within the chest, abdomen or pelvis. 5.  Aortic Atherosclerosis (ICD10-I70.0).  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   Anxiety    Bipolar disorder (HCC) 11/29/2020   Blood transfusion without reported diagnosis    Breast [name] (HCC)    [name] (HCC)    Cardiac murmur    Chest pain    Chronic fatigue syndrome 11/29/2020   Depression    Essential hypertension 11/29/2020   Hepatic steatosis determined by biopsy of liver 07/04/2020   HLD (hyperlipidemia)    Hypertension    Hypomagnesemia 03/26/2023   Hypothyroidism 11/29/2020   Insomnia 11/29/2020   Iron  deficiency anemia 06/05/2022   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   Migraine 11/29/2020   Mild intermittent asthma 11/29/2020   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   Mixed hyperlipidemia 11/29/2020   Obesity due to excess calories    Osteopenia after menopause 07/04/2020   Other long term (current) drug therapy  11/29/2020   Other vitamin B12 deficiency anemias 11/29/2020   Personal history of malignant neoplasm of breast 11/29/2020   Pre-diabetes    Renal cyst, acquired, right 07/04/2020   14.5 cm in October 2021   Retrocalcaneal bursitis (back of heel), left 04/22/2018   Seasonal allergic rhinitis 11/29/2020   Tightness of heel cord, left 04/22/2018   Tremor 08/02/2023   Type 2 diabetes mellitus without complications (HCC) 11/29/2020   Vitamin D  deficiency 11/29/2020    Past Surgical History:  Procedure Laterality Date   CESAREAN SECTION     COLONOSCOPY     IR BONE TUMOR(S)RF ABLATION  05/29/2022   IR BONE TUMOR(S)RF ABLATION  05/29/2022   IR KYPHO EA ADDL LEVEL THORACIC OR LUMBAR  05/29/2022   IR KYPHO THORACIC WITH BONE BIOPSY  05/29/2022   IR RADIOLOGIST EVAL & MGMT  05/22/2022   IR RADIOLOGIST EVAL & MGMT  06/12/2022   LIVER BIOPSY  01/2019   lumpectomy  2014   Left Breast   PORT-A-CATH REMOVAL     PORTACATH PLACEMENT  08/28/2013   STOMACH SURGERY     Tummy Tuck   TOTAL HIP ARTHROPLASTY Right 07/04/2021   Procedure: TOTAL HIP ARTHROPLASTY;  Surgeon: Ernie Cough, MD;  Location: [facility];  Service: Orthopedics;  Laterality: Right;  90   WISDOM TOOTH EXTRACTION      Family History  Problem Relation Age of Onset   Hyperlipidemia Mother    Diabetes Father    Stroke Father    Colon [name] Neg Hx    Colon polyps Neg Hx    Esophageal [name] Neg Hx    Rectal [name] Neg Hx    Stomach [name] Neg Hx     Social History:  reports that she quit smoking about 21 years ago. Her smoking use included cigarettes. She has never used smokeless tobacco. She reports that she does not currently use alcohol. She reports that she does not use drugs.The patient is accompanied by her husband today.  Allergies:  Allergies  Allergen Reactions   Xgeva  [Denosumab ] Other (See Comments)    Possible ONJ (09/13/23)   Reglan  [Metoclopramide ] Other (See Comments)    Made patient feel really funny when  taking this medication  Current Medications: Current Outpatient Medications  Medication Sig Dispense Refill   albuterol  (VENTOLIN  HFA) 108 (90 Base) MCG/ACT inhaler Inhale 2 puffs into the lungs every 6 (six) hours as needed for shortness of breath.     ARIPiprazole  (ABILIFY ) 5 MG tablet Take 2.5 mg by mouth daily.     aspirin 81 MG EC tablet Take 81 mg by mouth daily. Swallow whole.     atorvastatin  (LIPITOR) 20 MG tablet Take 1 tablet (20 mg total) by mouth daily. 90 tablet 3   augmented betamethasone dipropionate (DIPROLENE-AF) 0.05 % cream  (Patient taking differently: 1 Application as needed.)     calcium  carbonate (OS-CAL) 1250 (500 Ca) MG chewable tablet Chew by mouth 3 (three) times daily.     dapagliflozin  propanediol (FARXIGA ) 10 MG TABS tablet TAKE ONE TABLET BY MOUTH EVERY DAY 30 tablet 10   desvenlafaxine  (PRISTIQ ) 50 MG 24 hr tablet Take 50 mg by mouth daily.     dexamethasone  (DECADRON ) 4 MG tablet TAKE ONE TABLET BY MOUTH 2 TIMES A DAY FOR 5 DAYS AFTER CHEMOTHERAPY 20 tablet 2   diphenoxylate -atropine  (LOMOTIL ) 2.5-0.025 MG tablet TAKE TWO TABLETS BY MOUTH FOUR TIMES A DAY AS NEEDED FOR DIARRHEA OR LOOSE STOOLS 100 tablet 0   folic acid  (FOLVITE ) 1 MG tablet TAKE ONE TABLET BY MOUTH DAILY 30 tablet 5   labetalol  (NORMODYNE ) 100 MG tablet Take 0.5 tablets (50 mg total) by mouth 2 (two) times daily. 90 tablet 3   loratadine  (CLARITIN ) 10 MG tablet TAKE ONE TABLET BY MOUTH EVERY DAY 30 tablet 5   LORazepam  (ATIVAN ) 1 MG tablet Take 1 tablet (1 mg total) by mouth every 6 (six) hours as needed for anxiety (nausea). 60 tablet 0   magic mouthwash (lidocaine , diphenhydrAMINE , alum & mag hydroxide) suspension Take 5 mLs by mouth every 3 (three) hours as needed. Swish and spit; for throat/mouth pain     naloxone  (NARCAN ) nasal spray 4 mg/0.1 mL One spray in nostril as needed, may repeat every 2 to 3 minutes until medical assistance becomes available 4 each 1   nitroGLYCERIN   (NITROSTAT ) 0.4 MG SL tablet Place 0.4 mg under the tongue every 5 (five) minutes as needed for chest pain.     nystatin  (MYCOSTATIN /NYSTOP ) powder Apply 1 Application topically 2 (two) times daily.     omeprazole  (PRILOSEC) 40 MG capsule Take 1 capsule (40 mg total) by mouth at bedtime. (Patient taking differently: Take 40 mg by mouth in the morning and at bedtime.) 30 capsule 5   ondansetron  (ZOFRAN ) 8 MG tablet Take 1 tablet (8 mg total) by mouth every 8 (eight) hours as needed for nausea or vomiting. 60 tablet 5   Oxycodone  HCl 10 MG TABS TAKE ONE TABLET BY MOUTH EVERY 4 HOURS AS NEEDED FOR PAIN 180 tablet 0   Potassium 99 MG TABS Take 2 tablets by mouth 2 (two) times daily. GUMMIES     prochlorperazine  (COMPAZINE ) 10 MG tablet TAKE ONE TABLET BY MOUTH EVERY 6 HOURS AS NEEDED FOR NAUSEA/VOMITING 90 tablet 3   Vibegron (GEMTESA) 75 MG TABS Take 1 tablet by mouth daily.     Current Facility-Administered Medications  Medication Dose Route Frequency Provider Last Rate Last Admin   technetium sestamibi generic (CARDIOLITE ) injection 10.5 millicurie  10.5 millicurie Intravenous Once PRN Revankar, Rajan R, MD       Facility-Administered Medications Ordered in Other Visits  Medication Dose Route Frequency Provider Last Rate Last Admin   acetaminophen  (TYLENOL ) tablet 650 mg  650 mg Oral Once Cornelius Wanda DEL, MD       diphenhydrAMINE  (BENADRYL ) capsule 50 mg  50 mg Oral Once Cornelius Wanda DEL, MD       heparin  lock flush 100 unit/mL  500 Units Intracatheter Once PRN Harl Setter A, NP       sodium chloride  flush (NS) 0.9 % injection 10 mL  10 mL Intracatheter PRN Mosher, Kelli A, PA-C   10 mL at 11/15/23 1111   sodium chloride  flush (NS) 0.9 % injection 10 mL  10 mL Intracatheter PRN Mosher, Kelli A, PA-C       sodium chloride  flush (NS) 0.9 % injection 10 mL  10 mL Intravenous PRN Cornelius Wanda DEL, MD   10 mL at 12/06/23 1109   sodium chloride  flush 0.9 % injection 10 mL  10 mL  Intravenous PRN Cornelius Wanda DEL, MD   10 mL at 11/05/23 [phone]

## 2024-05-15 NOTE — Patient Instructions (Signed)
 [facility] - A DEPT OF [facility]  Discharge Instructions: Thank you for choosing [facility] to provide your oncology and hematology care.  If you have a lab appointment with the Cancer Center, please go directly to the Cancer Center and check in at the registration area.   Wear comfortable clothing and clothing appropriate for easy access to any Portacath or PICC line.   We strive to give you quality time with your provider. You may need to reschedule your appointment if you arrive late (15 or more minutes).  Arriving late affects you and other patients whose appointments are after yours.  Also, if you miss three or more appointments without notifying the office, you may be dismissed from the clinic at the provider's discretion.      For prescription refill requests, have your pharmacy contact our office and allow 72 hours for refills to be completed.    Today you received the following chemotherapy and/or immunotherapy agents FAM-TRASTUZUMAB  DERUXTECANFam-Trastuzumab  Deruxtecan Injection What is this medication? FAM-TRASTUZUMAB  DERUXTECAN (fam-tras TOOZ eu mab DER ux TEE kan) treats some types of cancer. It works by blocking a protein that causes cancer cells to grow and multiply. This helps to slow or stop the spread of cancer cells. This medicine may be used for other purposes; ask your health care provider or pharmacist if you have questions. COMMON BRAND NAME(S): ENHERTU  What should I tell my care team before I take this medication? They need to know if you have any of these conditions: Heart disease Heart failure Infection, especially a viral infection, such as chickenpox, cold sores, or herpes Liver disease Lung or breathing disease, such as asthma or COPD An unusual or allergic reaction to fam-trastuzumab  deruxtecan, other medications, foods, dyes, or preservatives Pregnant or trying to get pregnant Breast-feeding How should I use this  medication? This medication is injected into a vein. It is given by your care team in a hospital or clinic setting. A special MedGuide will be given to you before each treatment. Be sure to read this information carefully each time. Talk to your care team about the use of this medication in children. Special care may be needed. Overdosage: If you think you have taken too much of this medicine contact a poison control center or emergency room at once. NOTE: This medicine is only for you. Do not share this medicine with others. What if I miss a dose? It is important not to miss your dose. Call your care team if you are unable to keep an appointment. What may interact with this medication? Interactions are not expected. This list may not describe all possible interactions. Give your health care provider a list of all the medicines, herbs, non-prescription drugs, or dietary supplements you use. Also tell them if you smoke, drink alcohol, or use illegal drugs. Some items may interact with your medicine. What should I watch for while using this medication? Visit your care team for regular checks on your progress. Tell your care team if your symptoms do not start to get better or if they get worse. This medication may increase your risk of getting an infection. Call your care team for advice if you get a fever, chills, sore throat, or other symptoms of a cold or flu. Do not treat yourself. Try to avoid being around people who are sick. Avoid taking medications that contain aspirin, acetaminophen , ibuprofen, naproxen, or ketoprofen unless instructed by your care team. These medications may  hide a fever. Be careful brushing or flossing your teeth or using a toothpick because you may get an infection or bleed more easily. If you have any dental work done, tell your dentist you are receiving this medication. This medication may cause dry eyes and blurred vision. If you wear contact lenses, you may feel some  discomfort. Lubricating eye drops may help. See your care team if the problem does not go away or is severe. Talk to your care team if you may be pregnant. Serious birth defects can occur if you take this medication during pregnancy and for 7 months after the last dose. If your partner can get pregnant, use a condom during sex while taking this medication and for 4 months after the last dose. Do not breastfeed while taking this medication and for 7 months after the last dose. This medication may cause infertility. Talk to your care team if you are concerned about your fertility. What side effects may I notice from receiving this medication? Side effects that you should report to your care team as soon as possible: Allergic reactions--skin rash, itching, hives, swelling of the face, lips, tongue, or throat Dry cough, shortness of breath or trouble breathing Infection--fever, chills, cough, sore throat, wounds that don't heal, pain or trouble when passing urine, general feeling of discomfort or being unwell Heart failure--shortness of breath, swelling of the ankles, feet, or hands, sudden weight gain, unusual weakness or fatigue Unusual bruising or bleeding Side effects that usually do not require medical attention (report these to your care team if they continue or are bothersome): Constipation Diarrhea Hair loss Muscle pain Nausea Vomiting This list may not describe all possible side effects. Call your doctor for medical advice about side effects. You may report side effects to FDA at 1-800-FDA-1088. Where should I keep my medication? This medication is given in a hospital or clinic. It will not be stored at home. NOTE: This sheet is a summary. It may not cover all possible information. If you have questions about this medicine, talk to your doctor, pharmacist, or health care provider.  2024 Elsevier/Gold Standard (2023-03-29 00:00:00)      To help prevent nausea and vomiting after your  treatment, we encourage you to take your nausea medication as directed.  BELOW ARE SYMPTOMS THAT SHOULD BE REPORTED IMMEDIATELY: *FEVER GREATER THAN 100.4 F (38 C) OR HIGHER *CHILLS OR SWEATING *NAUSEA AND VOMITING THAT IS NOT CONTROLLED WITH YOUR NAUSEA MEDICATION *UNUSUAL SHORTNESS OF BREATH *UNUSUAL BRUISING OR BLEEDING *URINARY PROBLEMS (pain or burning when urinating, or frequent urination) *BOWEL PROBLEMS (unusual diarrhea, constipation, pain near the anus) TENDERNESS IN MOUTH AND THROAT WITH OR WITHOUT PRESENCE OF ULCERS (sore throat, sores in mouth, or a toothache) UNUSUAL RASH, SWELLING OR PAIN  UNUSUAL VAGINAL DISCHARGE OR ITCHING   Items with * indicate a potential emergency and should be followed up as soon as possible or go to the Emergency Department if any problems should occur.  Please show the CHEMOTHERAPY ALERT CARD or IMMUNOTHERAPY ALERT CARD at check-in to the Emergency Department and triage nurse.  Should you have questions after your visit or need to cancel or reschedule your appointment, please contact [facility] [place] - A DEPT OF [facility]  Dept: [phone]  and follow the prompts.  Office hours are 8:00 a.m. to 4:30 p.m. Monday - Friday. Please note that voicemails left after 4:00 p.m. may not be returned until the following business day.  We are closed  weekends and major holidays. You have access to a nurse at all times for urgent questions. Please call the main number to the clinic Dept: [phone] and follow the prompts.  For any non-urgent questions, you may also contact your provider using MyChart. We now offer e-Visits for anyone 68 and older to request care online for non-urgent symptoms. For details visit [email or web address].   Also download the MyChart app! Go to the app store, search MyChart, open the app, select [place], and log in with your MyChart username and password.

## 2024-05-17 ENCOUNTER — Other Ambulatory Visit: Payer: Self-pay

## 2024-05-18 ENCOUNTER — Inpatient Hospital Stay

## 2024-05-18 VITALS — BP 133/83 | HR 101 | Temp 97.9°F | Resp 18 | Ht <= 58 in

## 2024-05-18 DIAGNOSIS — Z5111 Encounter for antineoplastic chemotherapy: Secondary | ICD-10-CM | POA: Diagnosis not present

## 2024-05-18 DIAGNOSIS — E860 Dehydration: Secondary | ICD-10-CM

## 2024-05-18 DIAGNOSIS — Z23 Encounter for immunization: Secondary | ICD-10-CM

## 2024-05-18 LAB — CMP (CANCER CENTER ONLY)
ALT: 31 U/L (ref 0–44)
AST: 34 U/L (ref 15–41)
Albumin: 3.5 g/dL (ref 3.5–5.0)
Alkaline Phosphatase: 109 U/L (ref 38–126)
Anion gap: 11 (ref 5–15)
BUN: 28 mg/dL — ABNORMAL HIGH (ref 6–20)
CO2: 27 mmol/L (ref 22–32)
Calcium: 10.5 mg/dL — ABNORMAL HIGH (ref 8.9–10.3)
Chloride: 103 mmol/L (ref 98–111)
Creatinine: 0.73 mg/dL (ref 0.44–1.00)
GFR, Estimated: >60 mL/min (ref >60)
Glucose, Bld: 234 mg/dL — ABNORMAL HIGH (ref 70–99)
Potassium: 3.3 mmol/L — ABNORMAL LOW (ref 3.5–5.1)
Sodium: 141 mmol/L (ref 135–145)
Total Bilirubin: <0.2 mg/dL (ref 0.0–1.2)
Total Protein: 5.8 g/dL — ABNORMAL LOW (ref 6.5–8.1)

## 2024-05-18 LAB — CALCIUM, IONIZED: Calcium, Ionized, Serum: 6 mg/dL — ABNORMAL HIGH (ref 4.5–5.6)

## 2024-05-18 MED ORDER — SODIUM CHLORIDE 0.9 % IV SOLN: INTRAVENOUS | Status: DC

## 2024-05-18 MED ORDER — INFLUENZA VIRUS VACC SPLIT PF (FLUZONE) 0.5 ML IM SUSY
0.5000 mL | PREFILLED_SYRINGE | Freq: Once | INTRAMUSCULAR | Status: AC
  Administered 2024-05-18: 0.5 mL via INTRAMUSCULAR
  Filled 2024-05-18: qty 0.5

## 2024-05-18 NOTE — Patient Instructions (Signed)
 Dehydration, Adult Dehydration is a condition in which there is not enough water or other fluids in the body. This happens when a person loses more fluids than they take in. Important organs cannot work right without the right amount of fluids. Any loss of fluids from the body can cause dehydration. Dehydration can be mild, worse, or very bad. It should be treated right away to keep it from getting very bad. What are the causes? Conditions that cause loss of water in the body. They include: Watery poop (diarrhea). Vomiting. Sweating a lot. Fever. Infection. Peeing (urinating) a lot. Not drinking enough fluids. Certain medicines, such as medicines that take extra fluid out of the body (diuretics). Lack of safe drinking water. Not being able to get enough water and food. What increases the risk? Having a long-term (chronic) illness that has not been treated the right way, such as: Diabetes. Heart disease. Kidney disease. Being 25 years of age or older. Having a disability. Living in a place that is high above the ground or sea (high in altitude). The thinner, drier air causes more fluid loss. Doing exercises that put stress on your body for a long time. Being active when in hot places. What are the signs or symptoms? Symptoms of dehydration depend on how bad it is. Mild or worse dehydration Thirst. Dry lips or dry mouth. Feeling dizzy or light-headed. Muscle cramps. Passing little pee or dark pee. Pee may be the color of tea. Headache. Very bad dehydration Changes in skin. Skin may: Be cold to the touch (clammy). Be blotchy or pale. Not go back to normal right after you pinch it and let it go. Little or no tears, pee, or sweat. Fast breathing. Low blood pressure. Weak pulse. Pulse that is more than 100 beats a minute when you are sitting still. Other changes, such as: Feeling very thirsty. Eyes that look hollow (sunken). Cold hands and feet. Being confused. Being very  tired (lethargic) or having trouble waking from sleep. Losing weight. Loss of consciousness. How is this treated? Treatment for this condition depends on how bad your dehydration is. Treatment should start right away. Do not wait until your condition gets very bad. Very bad dehydration is an emergency. You will need to go to a hospital. Mild or worse dehydration can be treated at home. You may be asked to: Drink more fluids. Drink an oral rehydration solution (ORS). This drink gives you the right amount of fluids, salts, and minerals (electrolytes). Very bad dehydration can be treated: With fluids through an IV tube. By correcting low levels of electrolytes in the body. By treating the problem that caused your dehydration. Follow these instructions at home: Oral rehydration solution If told by your doctor, drink an ORS: Make an ORS. Use instructions on the package. Start by drinking small amounts, about  cup (120 mL) every 5-10 minutes. Slowly drink more until you have had the amount that your doctor said to have.  Eating and drinking  Drink enough clear fluid to keep your pee pale yellow. If you were told to drink an ORS, finish the ORS first. Then, start slowly drinking other clear fluids. Drink fluids such as: Water. Do not drink only water. Doing that can make the salt (sodium) level in your body get too low. Water from ice chips you suck on. Fruit juice that you have added water to (diluted). Low-calorie sports drinks. Eat foods that have the right amounts of salts and minerals, such as bananas, oranges, potatoes,  tomatoes, or spinach. Do not drink alcohol. Avoid drinks that have caffeine or sugar. These include:: High-calorie sports drinks. Fruit juice that you did not add water to. Soda. Coffee or energy drinks. Avoid foods that are greasy or have a lot of fat or sugar. General instructions Take over-the-counter and prescription medicines only as told by your doctor. Do  not take sodium tablets. Doing that can make the salt level in your body get too high. Return to your normal activities as told by your doctor. Ask your doctor what activities are safe for you. Keep all follow-up visits. Your doctor may check and change your treatment. Contact a doctor if: You have pain in your belly (abdomen) and the pain: Gets worse. Stays in one place. You have a rash. You have a stiff neck. You get angry or annoyed more easily than normal. You are more tired or have a harder time waking than normal. You feel weak or dizzy. You feel very thirsty. Get help right away if: You have any symptoms of very bad dehydration. You vomit every time you eat or drink. Your vomiting gets worse, does not go away, or you vomit blood or green stuff. You are getting treatment, but symptoms are getting worse. You have a fever. You have a very bad headache. You have: Diarrhea that gets worse or does not go away. Blood in your poop (stool). This may cause poop to look black and tarry. No pee in 6-8 hours. Only a small amount of pee in 6-8 hours, and the pee is very dark. You have trouble breathing. These symptoms may be an emergency. Get help right away. Call 911. Do not wait to see if the symptoms will go away. Do not drive yourself to the hospital. This information is not intended to replace advice given to you by your health care provider. Make sure you discuss any questions you have with your health care provider. Document Revised: 02/26/2022 Document Reviewed: 02/26/2022 Elsevier Patient Education  2024 [employer].

## 2024-05-28 ENCOUNTER — Encounter: Payer: Self-pay | Admitting: Hematology and Oncology

## 2024-05-29 ENCOUNTER — Other Ambulatory Visit: Payer: Self-pay

## 2024-05-29 DIAGNOSIS — M545 Low back pain, unspecified: Secondary | ICD-10-CM

## 2024-05-29 DIAGNOSIS — G8929 Other chronic pain: Secondary | ICD-10-CM

## 2024-05-29 MED ORDER — OXYCODONE HCL 10 MG PO TABS: 10.0000 mg | ORAL_TABLET | ORAL | 0 refills | Status: DC | PRN

## 2024-06-01 ENCOUNTER — Encounter: Payer: Self-pay | Admitting: Oncology

## 2024-06-03 ENCOUNTER — Encounter: Payer: Self-pay | Admitting: Oncology

## 2024-06-03 ENCOUNTER — Encounter: Payer: Self-pay | Admitting: Hematology and Oncology

## 2024-06-11 ENCOUNTER — Other Ambulatory Visit: Payer: Self-pay | Admitting: Medical Genetics

## 2024-06-11 DIAGNOSIS — Z006 Encounter for examination for normal comparison and control in clinical research program: Secondary | ICD-10-CM

## 2024-06-12 ENCOUNTER — Other Ambulatory Visit: Payer: Self-pay | Admitting: Hematology and Oncology

## 2024-06-12 ENCOUNTER — Inpatient Hospital Stay

## 2024-06-12 ENCOUNTER — Inpatient Hospital Stay (HOSPITAL_BASED_OUTPATIENT_CLINIC_OR_DEPARTMENT_OTHER): Admitting: Oncology

## 2024-06-12 ENCOUNTER — Encounter: Payer: Self-pay | Admitting: Oncology

## 2024-06-12 ENCOUNTER — Other Ambulatory Visit (HOSPITAL_BASED_OUTPATIENT_CLINIC_OR_DEPARTMENT_OTHER): Payer: Self-pay

## 2024-06-12 ENCOUNTER — Telehealth: Payer: Self-pay

## 2024-06-12 VITALS — BP 136/92 | HR 115 | Temp 98.2°F | Resp 18 | Ht <= 58 in | Wt 119.7 lb

## 2024-06-12 VITALS — HR 118

## 2024-06-12 DIAGNOSIS — D529 Folate deficiency anemia, unspecified: Secondary | ICD-10-CM | POA: Diagnosis not present

## 2024-06-12 DIAGNOSIS — Z170 Estrogen receptor positive status [ER+]: Secondary | ICD-10-CM

## 2024-06-12 DIAGNOSIS — C7951 Secondary malignant neoplasm of bone: Secondary | ICD-10-CM

## 2024-06-12 DIAGNOSIS — C50412 Malignant neoplasm of upper-outer quadrant of left female breast: Secondary | ICD-10-CM | POA: Diagnosis not present

## 2024-06-12 DIAGNOSIS — Z5111 Encounter for antineoplastic chemotherapy: Secondary | ICD-10-CM | POA: Diagnosis not present

## 2024-06-12 LAB — CMP (CANCER CENTER ONLY)
ALT: 17 U/L (ref 0–44)
AST: 31 U/L (ref 15–41)
Albumin: 3.9 g/dL (ref 3.5–5.0)
Alkaline Phosphatase: 176 U/L — ABNORMAL HIGH (ref 38–126)
Anion gap: 8 (ref 5–15)
BUN: 9 mg/dL (ref 6–20)
CO2: 38 mmol/L — ABNORMAL HIGH (ref 22–32)
Calcium: >15 mg/dL (ref 8.9–10.3)
Chloride: 98 mmol/L (ref 98–111)
Creatinine: 0.84 mg/dL (ref 0.44–1.00)
GFR, Estimated: >60 mL/min (ref >60)
Glucose, Bld: 128 mg/dL — ABNORMAL HIGH (ref 70–99)
Potassium: 2.6 mmol/L — CL (ref 3.5–5.1)
Sodium: 144 mmol/L (ref 135–145)
Total Bilirubin: 0.4 mg/dL (ref 0.0–1.2)
Total Protein: 6.6 g/dL (ref 6.5–8.1)

## 2024-06-12 LAB — CBC WITH DIFFERENTIAL (CANCER CENTER ONLY)
Abs Immature Granulocytes: 0.04 K/uL (ref 0.00–0.07)
Basophils Absolute: 0 K/uL (ref 0.0–0.1)
Basophils Relative: 0 %
Eosinophils Absolute: 0.2 K/uL (ref 0.0–0.5)
Eosinophils Relative: 3 %
HCT: 37.9 % (ref 36.0–46.0)
Hemoglobin: 11.9 g/dL — ABNORMAL LOW (ref 12.0–15.0)
Immature Granulocytes: 1 %
Lymphocytes Relative: 16 %
Lymphs Abs: 1.1 K/uL (ref 0.7–4.0)
MCH: 26.1 pg (ref 26.0–34.0)
MCHC: 31.4 g/dL (ref 30.0–36.0)
MCV: 83.1 fL (ref 80.0–100.0)
Monocytes Absolute: 0.7 K/uL (ref 0.1–1.0)
Monocytes Relative: 10 %
Neutro Abs: 5 K/uL (ref 1.7–7.7)
Neutrophils Relative %: 70 %
Platelet Count: 359 K/uL (ref 150–400)
RBC: 4.56 MIL/uL (ref 3.87–5.11)
RDW: 17 % — ABNORMAL HIGH (ref 11.5–15.5)
WBC Count: 7.1 K/uL (ref 4.0–10.5)
nRBC: 0 % (ref 0.0–0.2)

## 2024-06-12 MED ORDER — FUROSEMIDE 10 MG/ML IJ SOLN
20.0000 mg | Freq: Once | INTRAMUSCULAR | Status: DC
  Filled 2024-06-12: qty 2

## 2024-06-12 MED ORDER — POTASSIUM CHLORIDE CRYS ER 10 MEQ PO TBCR: 20.0000 meq | EXTENDED_RELEASE_TABLET | Freq: Two times a day (BID) | ORAL | 0 refills | Status: DC

## 2024-06-12 MED ORDER — CALCITONIN (SALMON) 200 UNIT/ML IJ SOLN
200.0000 [IU] | Freq: Two times a day (BID) | INTRAMUSCULAR | 0 refills | Status: DC
  Filled 2024-06-12: qty 14, 7d supply, fill #0

## 2024-06-12 MED ORDER — FUROSEMIDE 10 MG/ML IJ SOLN
20.0000 mg | Freq: Once | INTRAMUSCULAR | Status: AC
  Administered 2024-06-12: 20 mg via INTRAVENOUS

## 2024-06-12 MED ORDER — DEXTROSE 5 % IV SOLN: Freq: Once | INTRAVENOUS | Status: AC

## 2024-06-12 MED ORDER — FAM-TRASTUZUMAB DERUXTECAN-NXKI CHEMO 100 MG IV SOLR
5.3000 mg/kg | Freq: Once | INTRAVENOUS | Status: AC
  Administered 2024-06-12: 300 mg via INTRAVENOUS
  Filled 2024-06-12: qty 15

## 2024-06-12 MED ORDER — CALCITONIN (SALMON) 200 UNIT/ML IJ SOLN: 200.0000 [IU] | Freq: Two times a day (BID) | INTRAMUSCULAR | 0 refills | Status: DC

## 2024-06-12 MED ORDER — ACETAMINOPHEN 325 MG PO TABS
650.0000 mg | ORAL_TABLET | Freq: Once | ORAL | Status: AC
  Administered 2024-06-12: 650 mg via ORAL
  Filled 2024-06-12: qty 2

## 2024-06-12 MED ORDER — FENTANYL 25 MCG/HR TD PT72: 1.0000 | MEDICATED_PATCH | TRANSDERMAL | 0 refills | Status: DC

## 2024-06-12 MED ORDER — PALONOSETRON HCL INJECTION 0.25 MG/5ML
0.2500 mg | Freq: Once | INTRAVENOUS | Status: AC
  Administered 2024-06-12: 0.25 mg via INTRAVENOUS
  Filled 2024-06-12: qty 5

## 2024-06-12 MED ORDER — FULVESTRANT 250 MG/5ML IM SOSY
500.0000 mg | PREFILLED_SYRINGE | Freq: Once | INTRAMUSCULAR | Status: AC
  Administered 2024-06-12: 500 mg via INTRAMUSCULAR
  Filled 2024-06-12: qty 10

## 2024-06-12 MED ORDER — DIPHENHYDRAMINE HCL 25 MG PO CAPS
50.0000 mg | ORAL_CAPSULE | Freq: Once | ORAL | Status: AC
  Administered 2024-06-12: 50 mg via ORAL
  Filled 2024-06-12: qty 2

## 2024-06-12 MED ORDER — SODIUM CHLORIDE 0.9 % IV SOLN
150.0000 mg | Freq: Once | INTRAVENOUS | Status: AC
  Administered 2024-06-12: 150 mg via INTRAVENOUS
  Filled 2024-06-12: qty 150

## 2024-06-12 MED ORDER — DEXAMETHASONE SOD PHOSPHATE PF 10 MG/ML IJ SOLN
10.0000 mg | Freq: Once | INTRAMUSCULAR | Status: AC
  Administered 2024-06-12: 10 mg via INTRAVENOUS

## 2024-06-12 MED ORDER — POTASSIUM CHLORIDE IN NACL 20-0.9 MEQ/L-% IV SOLN
Freq: Once | INTRAVENOUS | Status: AC
  Filled 2024-06-12: qty 1000

## 2024-06-12 NOTE — Telephone Encounter (Signed)
 CRITICAL VALUE STICKER  CRITICAL VALUE:  Potassium 2.6, Calcium  greater than 15  RECEIVER (on-site recipient of call):  Hrithik Boschee,RN  DATE & TIME NOTIFIED: 06/12/2024 @ 1140  MESSENGER (representative from lab): Suzen Shine, lab  MD NOTIFIED: Eleanor Harl PIETY, Dr Cornelius, and Devere Afton LIED.  TIME OF NOTIFICATION: 1141  RESPONSE:  Pt is receiving IVF, potassium supplementation, prescription for Calcitonin, and lasix .

## 2024-06-12 NOTE — Progress Notes (Signed)
 [facility]  [address] [place],  [place]  [address] [phone]  Clinic Day: 06/12/2024  Referring physician: Pia Kerney SQUIBB, MD  ASSESSMENT & PLAN:  Assessment: Malignant neoplasm metastatic to bone [facility]) Bone metastases diagnosed in November 2022, with multiple marrow replacing lesions within the proximal right femur within the ischium as well as the inferior rami. 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 Ki67 was 60%. PET imaging in December revealed dominant finding of intensely hypermetabolic aggressive skeletal metastasis involving the calvarium, sternum, thoracic and lumbar spine, and pelvis, with potential pathologic fracture of the right femur with internal fixation. There was soft tissue extension into the anterior mediastinum associated with the manubrial metastasis. Multiple spinal vertebral body lesions had cortical destruction along the posterior margin. She started monthly Xgeva  in December, and completed radiation therapy. She continued with Herceptin /Perjeta  until she had progression of disease. In January 2024, she developed increased pain of the left hip. She was found to have innumerable metastatic lesions throughout the pelvis and proximal femur. There is a non displaced pathologic fracture through a large lesion in the superior left acetabulum. She was referred to an orthopedic oncologist at [facility] and they recommended radiation and no surgery. She received her palliative radiation.    We then switched her to Enhertu  chemotherapy in March of 2024, but she has had significant toxicities of nausea, vomiting, diarrhea, and hypokalemia.  She was also placed on hormonal therapy with Faslodex  injections.  Repeat CT imaging in July revealed decreased soft tissue thickening around the left subclavian structures and thoracic inlet.  Left lateral breast mass was stable.   Widespread mixed lytic and sclerotic osseous metastatic lesions were stable.  There was unchanged severe elevation of the left hemidiaphragm. CT neck in October to evaluate for possible soft tissue mass and painful swallowing did not reveal any soft tissue masses.  The extensive mixed lytic and sclerotic skeletal metastases were much more pronounced in the visible upper thorax than in the cervical spine and were felt to be grossly stable from July 2024. CT chest, abdomen and pelvis in November revealed stable bony disease without evidence of new/progressive disease.  Due to the development of osteonecrosis of the jaw, denosumab  was discontinued January 2025 after two years.   She completed 18 cycles of Enhertu , which was then placed on hold due to severe anemia.  She required multiple units of blood. She was found to be iron  deficient and received IV iron  in the form of Venofer  for 5 doses.  We resumed Enhertu  in April.  She has continued fulvestrant  monthly.  In May, patient requested she receive Enhertu  every 4 weeks. However. She now appears to have progressive bone metastases based on worsening pain and hypercalcemia. I don't see organ involvement on her CT scans, but we are still awaiting the official report. I think we will need to change treatment and most likely will go with an oral drug targeted at [id number].    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  She was treated with lumpectomy followed by adjuvant chemotherapy and radiation therapy. She was on adjuvant hormonal therapy with tamoxifen 20 mg daily from October 2015 to May 2020, but due to elevation of the liver transaminases was switched to anastrazole.  She then developed bone metastasis in November  2022 so has been receiving chemotherapy. She had skin thickening on her last mammogram, but a nodule in the left breast was  stable on CT imaging. The skin thickening is felt to possibly due to radiation, as her exam remained stable. Continued mammograms are not recommended.   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 She developed pain of the right jaw in February 2025.  Panorex x-rays revealed an abnormal area in the right lower jaw consistent with osteonecrosis.  Xgeva  was discontinued.  She was treated with Augmentin  in February.  She has been working closely with her dentist. She has had 2 fillings done, but also may need root canals.  She sees him again on April 8.  She had swelling and erythema of the left jaw earlier this month, so was placed on amoxicillin  4 times daily, with resolution of the swelling and erythema.  The redness and swelling have resolved, but she states she still has some mild tenderness of the anterior left jaw.  She has had recurrent swelling and erythema and requires intermittent courses of Amoxicillin . We will treat her again now with Augmentin . She has been seen by the oral surgeon, Dr. Lonni Sax, and had a CT scan of the jaw, but I do not have that information. He wanted to know more details about her treatment. She was on denosumab  for two years and her last dose was in January of 2025. Her bone cancer is metastatic from the breast and her current chemotherapy is Enhertu .    Iron  deficiency anemia New iron  deficiency anemia diagnosed in March of 2025, when she presented with acute worsening of her anemia. She required transfusion of 3 units of PRBC's. She then received IV iron  in the form of Venofer  for 5 doses. She is taking oral iron  now.   Her hemoglobin has slightly improved, which may be due to hemoconcentration.  She was referred to Dr. Charlanne to consider evaluation of the iron  deficiency. He will hold off on a colonoscopy. He performed an EGD 03/13/2024 which revealed both gastric and duodenal ulcers as well as esophageal stricture. He did recommend  increasing the omeprazole  to 40 mg BID and added Carafate .  Hypercalcemia This was first seen last month, but improved after she cut down on calcium  in her diet. Now it is extremely high and we want to avoid bisphosphonate and denosumab  due to her ONJ. I will plan calcitonin injections at 200 mg twice daily for the next week (4 mg/kg) and we may need to double that next week. I will also give her IV fluids with a small dose of IV Lasix  for a calciuric effect. If this is not successful, we will consider adding corticosteroids. It may take a change in her therapy to control her bone metastases.   Hypokalemia This is quite severe and she is unable to tolerate oral potassium. The over the counter gummies are not effective, so I will try her on the 10 meq potassium to take 2 TID, as tolerated. We will give her IV potassium of 40 meq today along with IV fluids. We will bring her back next week to recheck her chemistries and plan on further infusions. She will probably need to return twice next week to try to keep her out of the hospital.   Pain management I will add fentanyl  25 mcg every 3 days for long-acting opioid. She can continue the oxycodone , but hopefully will get better relief and will need  less of this. I expect we will have to titrate the fentanyl  dose higher, but we will go slowly with this.     Plan: She feels sleepy and confused with decreased concentration and even fell asleep while eating breakfast this morning. She complains of increasing right hip pain and lower back pain, both rated 6/10. She has pain in her jaw due to osteonecrosis that prevents her from comfortably eating at times. She currently takes 10 mg oxycodone  every 4 hours and usually takes this around the clock. I suggested adding a long-acting pain medication to better control her pain. She is agreeable to this so I will prescribe 25 mcg transdermal fentanyl  patches. I told her that she can still take her oxycodone  alongside  this, but she may find that she won't need as much once we titrate the correct dose of the fentanyl . She also states that she has epiphora. I instructed her to use a warm compress over her eyes and OTC artificial tears to manage this. She continues to take 500 mg penicillin 4 times a day without difficulty and 2 OTC potassium gummies TID, which apparently are ineffective since her potassium is even lower at 2.5. She had CT scans done yesterday, but the report is pending. I reviewed the images with them at her appointment today and do not see any vital organ involvement. She has been seen by the oral surgeon, Dr. Lonni Sax, and had a CT scan of the jaw, but I do not have that information. He wanted to know more details about her treatment. She was on denosumab  for two years and her last dose was in January of 2025. Her bone cancer is metastatic from the breast and her current chemotherapy is Enhertu .   She has a WBC of 7.1, a slightly low hemoglobin of 11.9 improved from 11.0, and platelet count of 359,000. Her CMP revealed an extremely low potassium of 2.5, a high calcium  of greater than 15 , and a high alkaline phosphatase of 176 up from 109. I instructed her to avoid calcium  in her diet and to take oral 10 meq potassium tablets six times daily. I am ordering IV 40 meq potassium, 20 mg Lasix  (for the calciuric effect), and fluids. I will prescribe 200 mg subcutaneous calcitonin every 12 hours. She will return Monday for repeat labs and infusions. If she worsens over the weekend, she will have to be hospitalized, but we will try to avoid that. We will see her back again in 1 week with CBC and CMP. I explained that the uncontrolled hypercalcemia and increasing pain are indications of progressive bone metastases and we will need to consider a change in her therapy. She does have a mutation to [facility] so we can use one of the targeted oral medications for this. We will discuss this more when she returns next  week. The patient and her husband understand the plans discussed today and are in agreement with them.  She knows to contact our office if she develops concerns prior to her next appointment.  I provided 42 minutes of face-to-face time during this encounter and > 50% was spent counseling as documented under my assessment and plan.   Wanda VEAR Cornish, MD  [facility] - A DEPT OF [facility] [address] [place] [address] Dept: [phone] Dept Fax: [phone]   Orders Placed This Encounter  Procedures   CMP (Cancer Center only)    Standing Status:  Future    Number of Occurrences:   1    Expected Date:   06/12/2024    Expiration Date:   06/12/2025    Scheduling Instructions:     Repeat CMP    CHIEF COMPLAINT:  CC: Metastatic hormone and HER2 receptor positive breast cancer  Current Treatment: Enhertu  every 4 weeks  HISTORY OF PRESENT ILLNESS:   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 (HCC)   07/23/2013 Cancer Staging   Staging form: Breast, AJCC 7th Edition - Clinical stage from 07/23/2013: Stage IIB (T2, N1, M0) - Signed by Cornelius Wanda DEL, MD on 07/04/2020 Prognostic indicators: Pos LVI   09/22/2021 - 04/06/2022 Chemotherapy   Patient is on Treatment Plan : BREAST  Trastuzumab  + Pertuzumab  q21d      09/22/2021 - 09/21/2022 Chemotherapy   Patient is on Treatment Plan : BREAST Trastuzumab  + Pertuzumab  q21d     10/12/2022 - 06/12/2024 Chemotherapy   Patient is on Treatment Plan : BREAST METASTATIC Fam-Trastuzumab Deruxtecan-nxki  (Enhertu ) (5.4) q28d (changed from q21d on 12/25/23)     Malignant neoplasm metastatic to bone (HCC)  06/28/2021 Initial Diagnosis   Bone metastases (HCC)   09/22/2021 - 04/06/2022 Chemotherapy   Patient is on Treatment Plan : BREAST   Trastuzumab  + Pertuzumab  q21d      09/22/2021 - 09/21/2022 Chemotherapy   Patient is on Treatment Plan : BREAST Trastuzumab  + Pertuzumab  q21d     05/11/2022 Imaging   CT chest, abdomen and pelvis:  IMPRESSION:  1. Widespread bony metastatic disease shows increased sclerosis  since previous imaging. This is likely related to response to  therapy in the interval. There is a single lesion along the LEFT  iliac which shows continued lytic change and warrants attention on  follow-up  2. Interval development of pathologic fractures at in the upper  thoracic spine and at the thoracolumbar junction with associated  kyphosis.  3. No signs of solid organ or nodal metastatic disease.  4. Marked elevation of the LEFT hemidiaphragm as on the prior PET.  5. Aortic atherosclerosis.    10/12/2022 - 06/12/2024 Chemotherapy   Patient is on Treatment Plan : BREAST METASTATIC Fam-Trastuzumab Deruxtecan-nxki  (Enhertu ) (5.4) q28d (changed from q21d on 12/25/23)       INTERVAL HISTORY:  Anna [name] is here today for repeat clinical assessment for her metastatic hormone and HER2 receptor positive breast cancer. Patient states that she feels sleepy and confused with decreased concentration and even fell asleep while eating breakfast this morning. She complains of increasing right hip pain and lower back pain, both rated 6/10. She has pain in her jaw due to osteonecrosis that prevents her from comfortably eating at times. She currently takes 10 mg oxycodone  every 4 hours and usually takes this around the clock. I suggested adding a long-acting pain medication to better control her pain. She is agreeable to this so I will prescribe 25 mcg transdermal fentanyl  patches. I told her that she can still take her oxycodone  alongside this, but she may find that she won't need as much once we titrate the correct dose of the fentanyl . She also states that she has epiphora. I instructed her to use a warm compress over her eyes and OTC  artificial tears to manage this. She continues to take 500 mg penicillin 4 times a day without difficulty and 2 OTC potassium gummies TID, which apparently  are ineffective since her potassium is even lower at 2.5. She had CT scans done yesterday, but the report is pending. I reviewed the images with them at her appointment today and do not see any vital organ involvement. She has been seen by the oral surgeon, Dr. Lonni Sax, and had a CT scan of the jaw, but I do not have that information. He wanted to know more details about her treatment. She was on denosumab  for two years and her last dose was in January of 2025. Her bone cancer is metastatic from the breast and her current chemotherapy is Enhertu .   She has a WBC of 7.1, a slightly low hemoglobin of 11.9 improved from 11.0, and platelet count of 359,000. Her CMP revealed an extremely low potassium of 2.5, a high calcium  of greater than 15 , and a high alkaline phosphatase of 176 up from 109. I instructed her to avoid calcium  in her diet and to take oral 10 meq potassium tablets six times daily. I am ordering IV 40 meq potassium, 20 mg Lasix  (for the calciuric effect), and fluids. I will prescribe 200 mg subcutaneous calcitonin every 12 hours. She will return Monday for repeat labs and infusions. If she worsens over the weekend, she will have to be hospitalized, but we will try to avoid that. We will see her back again in 1 week with CBC and CMP. I explained that the uncontrolled hypercalcemia and increasing pain are indications of progressive bone metastases and we will need to consider a change in her therapy. She does have a mutation to [facility] so we can use one of the targeted oral medications for this. We will discuss this more when she returns next week. She denies fever, chills, night sweats, or other signs of infection. She denies cardiorespiratory and gastrointestinal issues.  Her appetite is good and Her weight has decreased 9 pounds over  last 4 weeks.  REVIEW OF SYSTEMS:   Review of Systems  Constitutional:  Positive for appetite change (poor) and fatigue (extreme somnolence). Negative for chills, fever and unexpected weight change.  HENT:   Negative for lump/mass, mouth sores, sore throat and trouble swallowing.        Jaw swelling and erythema likely related to her ONJ; rhinorrhea  Eyes:  Positive for eye problems (epiphora).  Respiratory:  Negative for cough and shortness of breath.   Cardiovascular:  Negative for chest pain and leg swelling.  Gastrointestinal:  Positive for constipation and nausea (some). Negative for abdominal pain, diarrhea and vomiting.  Genitourinary:  Negative for bladder incontinence, difficulty urinating, dysuria, frequency and hematuria.   Musculoskeletal:  Positive for arthralgias (right hip 6/10), back pain (Lower back 6/10), flank pain, gait problem and neck pain. Negative for myalgias.  Skin:  Negative for rash and wound.  Neurological:  Positive for gait problem. Negative for dizziness, extremity weakness, headaches, light-headedness and numbness.  Hematological:  Negative for adenopathy. Does not bruise/bleed easily.  Psychiatric/Behavioral:  Positive for confusion and decreased concentration (brain fog). Negative for depression and sleep disturbance. The patient is not nervous/anxious.     VITALS:   Blood pressure (!) 136/92, pulse (!) 115, temperature 98.2 F (36.8 C), temperature source Oral, resp. rate 18, height 4' 8 (1.422 m), weight 119 lb 11.2 oz (54.3 kg), last menstrual period 08/18/2013, SpO2 97%.  Wt Readings from Last 3 Encounters:  06/12/24 119 lb 11.2 oz (54.3 kg)  05/15/24 128 lb 3.2 oz (58.2 kg)  04/17/24 134 lb 8 oz (  61 kg)    Body mass index is 26.84 kg/m.  Performance status (ECOG): 1 - Symptomatic but completely ambulatory  PHYSICAL EXAM:   Physical Exam Vitals and nursing note reviewed. Exam conducted with a chaperone present.  Constitutional:       General: She is not in acute distress.    Appearance: She is normal weight. She is not ill-appearing.  HENT:     Head: Normocephalic and atraumatic.     Comments: She has some firm swelling in the submental area    Right Ear: Tympanic membrane, ear canal and external ear normal.     Left Ear: Tympanic membrane, ear canal and external ear normal.     Nose: Nose normal.     Mouth/Throat:     Mouth: Mucous membranes are moist.     Pharynx: Oropharynx is clear. No oropharyngeal exudate or posterior oropharyngeal erythema.  Eyes:     General: No scleral icterus.    Extraocular Movements: Extraocular movements intact.     Conjunctiva/sclera: Conjunctivae normal.     Pupils: Pupils are equal, round, and reactive to light.  Cardiovascular:     Rate and Rhythm: Normal rate and regular rhythm.     Pulses: Normal pulses.     Heart sounds: Normal heart sounds. No murmur heard.    No friction rub. No gallop.  Pulmonary:     Effort: Pulmonary effort is normal.     Breath sounds: Normal breath sounds. No wheezing, rhonchi or rales.  Chest:     Comments: Breast exam is deferred Abdominal:     General: Bowel sounds are normal. There is no distension.     Palpations: Abdomen is soft. There is no hepatomegaly, splenomegaly or mass.     Tenderness: There is no abdominal tenderness.  Musculoskeletal:        General: Normal range of motion.     Cervical back: Normal range of motion and neck supple. No tenderness.     Right lower leg: Edema (mild) present.     Left lower leg: Edema (mild) present.  Lymphadenopathy:     Cervical: No cervical adenopathy.     Upper Body:     Right upper body: No supraclavicular or axillary adenopathy.     Left upper body: No supraclavicular or axillary adenopathy.     Lower Body: No right inguinal adenopathy. No left inguinal adenopathy.  Skin:    General: Skin is warm and dry.     Coloration: Skin is not jaundiced.     Findings: No rash.  Neurological:      General: No focal deficit present.     Mental Status: She is oriented to person, place, and time. Mental status is at baseline. She is lethargic.     Cranial Nerves: Cranial nerves 2-12 are intact. No cranial nerve deficit.  Psychiatric:        Mood and Affect: Mood normal.        Behavior: Behavior normal.        Thought Content: Thought content normal.        Judgment: Judgment normal.     LABS:      Latest Ref Rng & Units 06/24/2024    8:25 AM 06/22/2024    8:44 AM 06/18/2024   10:26 AM  CBC  WBC 4.0 - 10.5 K/uL 2.4  7.8  12.4   Hemoglobin 12.0 - 15.0 g/dL 9.9  88.8  88.2   Hematocrit 36.0 - 46.0 % 30.6  34.7  36.0   Platelets 150 - 400 K/uL 361  376  298       Latest Ref Rng & Units 06/24/2024    8:25 AM 06/22/2024    8:44 AM 06/18/2024   10:26 AM  CMP  Glucose 70 - 99 mg/dL 829  790  878   BUN 6 - 20 mg/dL 13  12  13    Creatinine 0.44 - 1.00 mg/dL 9.31  9.33  9.40   Sodium 135 - 145 mmol/L 132  135  135   Potassium 3.5 - 5.1 mmol/L 2.7  2.7  2.8   Chloride 98 - 111 mmol/L 89  90  91   CO2 22 - 32 mmol/L 34  33  31   Calcium  8.9 - 10.3 mg/dL 86.9  86.4  89.0   Total Protein 6.5 - 8.1 g/dL 5.7  6.2  6.3   Total Bilirubin 0.0 - 1.2 mg/dL 0.6  0.6  0.5   Alkaline Phos 38 - 126 U/L 188  166  158   AST 15 - 41 U/L 34  31  34   ALT 0 - 44 U/L 20  21  16     Lab Results  Component Value Date   CEA1 2.2 07/20/2021   /  CEA  Date Value Ref Range Status  07/20/2021 2.2 0.0 - 4.7 ng/mL Final    Comment:    (NOTE)                             Nonsmokers          <3.9                             Smokers             <5.6 [employer] Electrochemiluminescence Immunoassay (ECLIA) Values obtained with different assay methods or kits cannot be used interchangeably.  Results cannot be interpreted as absolute evidence of the presence or absence of malignant disease. Performed At: [facility] [address] [place], [place] [id number] Jennette Shorter MD  Ey:[phone]     Lab Results  Component Value Date   TIBC 276 04/16/2024   TIBC 269 01/24/2024   TIBC 251 10/25/2023   FERRITIN 282 04/16/2024   FERRITIN 326 (H) 01/24/2024   FERRITIN 129 10/25/2023   IRONPCTSAT 13 04/16/2024   IRONPCTSAT 13 01/24/2024   IRONPCTSAT 8 (L) 10/25/2023   Lab Results  Component Value Date   LDH 143 10/25/2023   LDH 171 07/10/2023    STUDIES:  EXAM: 01/29/2024 CT CHEST, ABDOMEN, AND PELVIS WITH CONTRAST IMPRESSION: 1. New lytic metastasis of the right ilium abutting the sacroiliac joint. Slightly increased sclerotic character of multiple additional pelvic metastases. Findings evidence some progression of osseous metastatic disease although likely with some additional degree of sclerotic treatment response. 2. Otherwise unchanged widespread osseous metastatic disease including numerous unchanged pathologic wedge deformities throughout the thoracic and lumbar spine, including vertebral cement augmentation of T12 and L1. 3. Unchanged small mass in the lower outer left breast. 4. No evidence of lymphadenopathy or soft tissue metastatic disease in the chest, abdomen, or pelvis. 5. Coronary artery disease.   EXAM: 09/05/2023 CT CHEST, ABDOMEN, AND PELVIS WITH CONTRAST IMPRESSION: 1. Unchanged size of the mass in the left breast with overlying skin thickening. 2. No significant interval change in the widespread osseous metastatic disease involving the axial and proximal  appendicular skeleton. 3. Similar soft tissue thickening about the left subclavian structures and thoracic inlet, possibly reflecting posttreatment change. Suggest continued attention on follow-up imaging. 4. No evidence of new or progressive disease within the chest, abdomen or pelvis. 5.  Aortic Atherosclerosis (ICD10-I70.0).  HISTORY:   Past Medical History:  Diagnosis Date   Achilles tendinitis of left lower extremity 04/22/2018   Acute peptic ulcer without hemorrhage and  without perforation 11/29/2020   Acute sinusitis 11/29/2020   Anemia in neoplastic disease 03/26/2023   Anxiety    Bipolar disorder (HCC) 11/29/2020   Blood transfusion without reported diagnosis    Breast cancer (HCC)    Cancer (HCC)    Cardiac murmur    Chest pain    Chronic fatigue syndrome 11/29/2020   Depression    Essential hypertension 11/29/2020   Hepatic steatosis determined by biopsy of liver 07/04/2020   HLD (hyperlipidemia)    Hypertension    Hypomagnesemia 03/26/2023   Hypothyroidism 11/29/2020   Insomnia 11/29/2020   Iron  deficiency anemia 06/05/2022   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   Migraine 11/29/2020   Mild intermittent asthma 11/29/2020   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   Mixed hyperlipidemia 11/29/2020   Obesity due to excess calories    Osteopenia after menopause 07/04/2020   Other long term (current) drug therapy 11/29/2020   Other vitamin B12 deficiency anemias 11/29/2020   Personal history of malignant neoplasm of breast 11/29/2020   Pre-diabetes    Renal cyst, acquired, right 07/04/2020   14.5 cm in October 2021   Retrocalcaneal bursitis (back of heel), left 04/22/2018   Seasonal allergic rhinitis 11/29/2020   Tightness of heel cord, left 04/22/2018   Tremor 08/02/2023   Type 2 diabetes mellitus without complications (HCC) 11/29/2020   Vitamin D  deficiency 11/29/2020    Past Surgical History:  Procedure Laterality Date   CESAREAN SECTION     COLONOSCOPY     IR BONE TUMOR(S)RF ABLATION  05/29/2022   IR BONE TUMOR(S)RF ABLATION  05/29/2022   IR KYPHO EA ADDL LEVEL THORACIC OR LUMBAR  05/29/2022   IR KYPHO THORACIC WITH BONE BIOPSY  05/29/2022   IR RADIOLOGIST EVAL & MGMT  05/22/2022   IR RADIOLOGIST EVAL & MGMT  06/12/2022   LIVER BIOPSY  01/2019   lumpectomy  2014   Left Breast   PORT-A-CATH REMOVAL     PORTACATH PLACEMENT  08/28/2013   STOMACH SURGERY     Tummy Tuck   TOTAL  HIP ARTHROPLASTY Right 07/04/2021   Procedure: TOTAL HIP ARTHROPLASTY;  Surgeon: Ernie Cough, MD;  Location: [facility];  Service: Orthopedics;  Laterality: Right;  90   WISDOM TOOTH EXTRACTION      Family History  Problem Relation Age of Onset   Hyperlipidemia Mother    Diabetes Father    Stroke Father    Colon cancer Neg Hx    Colon polyps Neg Hx    Esophageal cancer Neg Hx    Rectal cancer Neg Hx    Stomach cancer Neg Hx     Social History:  reports that she quit smoking about 21 years ago. Her smoking use included cigarettes. She has never used smokeless tobacco. She reports that she does not currently use alcohol. She reports that she does not use drugs.The patient is accompanied by her husband today.  Allergies:  Allergies  Allergen Reactions   Xgeva  [Denosumab ] Other (See Comments)    Possible ONJ (09/13/23)  Reglan  [Metoclopramide ] Other (See Comments)    Made patient feel really funny when taking this medication     Current Medications: Current Outpatient Medications  Medication Sig Dispense Refill   penicillin v potassium (VEETID) 500 MG tablet Take 500 mg by mouth 4 (four) times daily. (Patient taking differently: Take 500 mg by mouth 4 (four) times daily. Finished one round, unsure if she needs to continue or not at this time)     albuterol  (VENTOLIN  HFA) 108 (90 Base) MCG/ACT inhaler Inhale 2 puffs into the lungs every 6 (six) hours as needed for shortness of breath.     amoxicillin -clavulanate (AUGMENTIN ) 875-125 MG tablet Take 1 tablet by mouth 2 (two) times daily.     ARIPiprazole  (ABILIFY ) 5 MG tablet Take 2.5 mg by mouth daily.     aspirin 81 MG EC tablet Take 81 mg by mouth daily. Swallow whole.     atorvastatin  (LIPITOR) 20 MG tablet Take 1 tablet (20 mg total) by mouth daily. 90 tablet 3   augmented betamethasone dipropionate (DIPROLENE-AF) 0.05 % cream  (Patient taking differently: 1 Application as needed.)     calcitonin (MIACALCIN) 200 UNIT/ML injection  Inject 1 mL (200 Units total) into the skin 2 (two) times daily. 14 mL 0   dapagliflozin  propanediol (FARXIGA ) 10 MG TABS tablet TAKE ONE TABLET BY MOUTH EVERY DAY 30 tablet 10   desvenlafaxine  (PRISTIQ ) 50 MG 24 hr tablet Take 50 mg by mouth daily.     dexamethasone  (DECADRON ) 4 MG tablet Take 1 tablet (4 mg total) by mouth daily. 30 tablet 3   diphenoxylate -atropine  (LOMOTIL ) 2.5-0.025 MG tablet TAKE TWO TABLETS BY MOUTH FOUR TIMES A DAY AS NEEDED FOR DIARRHEA OR LOOSE STOOLS 100 tablet 0   fentaNYL  (DURAGESIC ) 25 MCG/HR Place 1 patch onto the skin every 3 (three) days. 5 patch 0   fluconazole  (DIFLUCAN ) 150 MG tablet Take 1 tablet (150 mg total) by mouth daily. 5 tablet 3   folic acid  (FOLVITE ) 1 MG tablet TAKE ONE TABLET BY MOUTH DAILY 30 tablet 5   labetalol  (NORMODYNE ) 100 MG tablet Take 0.5 tablets (50 mg total) by mouth 2 (two) times daily. 90 tablet 3   loratadine  (CLARITIN ) 10 MG tablet TAKE ONE TABLET BY MOUTH EVERY DAY 30 tablet 5   LORazepam  (ATIVAN ) 1 MG tablet Take 1 tablet (1 mg total) by mouth every 6 (six) hours as needed for anxiety (nausea). 60 tablet 0   magic mouthwash (lidocaine , diphenhydrAMINE , alum & mag hydroxide) suspension Take 5 mLs by mouth every 3 (three) hours as needed. Swish and spit; for throat/mouth pain     naloxone  (NARCAN ) nasal spray 4 mg/0.1 mL One spray in nostril as needed, may repeat every 2 to 3 minutes until medical assistance becomes available 4 each 1   nitroGLYCERIN  (NITROSTAT ) 0.4 MG SL tablet Place 0.4 mg under the tongue every 5 (five) minutes as needed for chest pain.     nystatin  (MYCOSTATIN /NYSTOP ) powder Apply 1 Application topically 2 (two) times daily.     omeprazole  (PRILOSEC) 40 MG capsule Take 1 capsule (40 mg total) by mouth at bedtime. 30 capsule 5   ondansetron  (ZOFRAN ) 8 MG tablet Take 1 tablet (8 mg total) by mouth every 8 (eight) hours as needed for nausea or vomiting. 60 tablet 5   Oxycodone  HCl 10 MG TABS Take 1 tablet (10 mg  total) by mouth every 4 (four) hours as needed. for pain 180 tablet 0   Potassium 99 MG TABS  Take 2 tablets by mouth 2 (two) times daily. GUMMIES     potassium chloride  (KLOR-CON  M) 10 MEQ tablet Take 2 tablets (20 mEq total) by mouth 2 (two) times daily. 60 tablet 0   prochlorperazine  (COMPAZINE ) 10 MG tablet TAKE ONE TABLET BY MOUTH EVERY 6 HOURS AS NEEDED FOR NAUSEA/VOMITING 90 tablet 3   Vibegron (GEMTESA) 75 MG TABS Take 1 tablet by mouth daily.     Current Facility-Administered Medications  Medication Dose Route Frequency Provider Last Rate Last Admin   technetium sestamibi generic (CARDIOLITE ) injection 10.5 millicurie  10.5 millicurie Intravenous Once PRN Revankar, Rajan R, MD       Facility-Administered Medications Ordered in Other Visits  Medication Dose Route Frequency Provider Last Rate Last Admin   heparin  lock flush 100 unit/mL  500 Units Intracatheter Once PRN Harl Setter A, NP       sodium chloride  flush (NS) 0.9 % injection 10 mL  10 mL Intracatheter PRN Mosher, Kelli A, PA-C   10 mL at 11/15/23 1111   sodium chloride  flush (NS) 0.9 % injection 10 mL  10 mL Intracatheter PRN Mosher, Kelli A, PA-C       sodium chloride  flush (NS) 0.9 % injection 10 mL  10 mL Intravenous PRN Cornelius Wanda DEL, MD   10 mL at 12/06/23 1109   sodium chloride  flush 0.9 % injection 10 mL  10 mL Intravenous PRN Cornelius Wanda DEL, MD   10 mL at 11/05/23 9089    I,Cortlin [employer],acting as a scribe for Wanda DEL Cornelius, MD.,have documented all relevant documentation on the behalf of Wanda DEL Cornelius, MD,as directed by  Wanda DEL Cornelius, MD while in the presence of Wanda DEL Cornelius, MD.  I have reviewed this report as typed by the medical scribe, and it is complete and accurate.

## 2024-06-12 NOTE — Progress Notes (Signed)
 Education done with pt and husband to self administer calcitonin injections SQ in abdomen. First dose of calcitonin given to self by pt.

## 2024-06-15 ENCOUNTER — Inpatient Hospital Stay: Attending: Hematology and Oncology

## 2024-06-15 ENCOUNTER — Other Ambulatory Visit: Payer: Self-pay | Admitting: Hematology and Oncology

## 2024-06-15 ENCOUNTER — Inpatient Hospital Stay

## 2024-06-15 VITALS — BP 112/80 | HR 86 | Resp 18 | Ht <= 58 in

## 2024-06-15 DIAGNOSIS — M87180 Osteonecrosis due to drugs, jaw: Secondary | ICD-10-CM | POA: Diagnosis not present

## 2024-06-15 DIAGNOSIS — D509 Iron deficiency anemia, unspecified: Secondary | ICD-10-CM | POA: Diagnosis not present

## 2024-06-15 DIAGNOSIS — Z9221 Personal history of antineoplastic chemotherapy: Secondary | ICD-10-CM | POA: Insufficient documentation

## 2024-06-15 DIAGNOSIS — C7951 Secondary malignant neoplasm of bone: Secondary | ICD-10-CM

## 2024-06-15 DIAGNOSIS — B379 Candidiasis, unspecified: Secondary | ICD-10-CM | POA: Insufficient documentation

## 2024-06-15 DIAGNOSIS — Z923 Personal history of irradiation: Secondary | ICD-10-CM | POA: Insufficient documentation

## 2024-06-15 DIAGNOSIS — C50412 Malignant neoplasm of upper-outer quadrant of left female breast: Secondary | ICD-10-CM | POA: Diagnosis present

## 2024-06-15 DIAGNOSIS — Z87891 Personal history of nicotine dependence: Secondary | ICD-10-CM | POA: Diagnosis not present

## 2024-06-15 LAB — COMPREHENSIVE METABOLIC PANEL WITH GFR
ALT: 16 U/L (ref 0–44)
AST: 34 U/L (ref 15–41)
Albumin: 3.7 g/dL (ref 3.5–5.0)
Alkaline Phosphatase: 153 U/L — ABNORMAL HIGH (ref 38–126)
Anion gap: 10 (ref 5–15)
BUN: 24 mg/dL — ABNORMAL HIGH (ref 6–20)
CO2: 37 mmol/L — ABNORMAL HIGH (ref 22–32)
Calcium: 11.5 mg/dL — ABNORMAL HIGH (ref 8.9–10.3)
Chloride: 93 mmol/L — ABNORMAL LOW (ref 98–111)
Creatinine, Ser: 0.66 mg/dL (ref 0.44–1.00)
GFR, Estimated: >60 mL/min (ref >60)
Glucose, Bld: 107 mg/dL — ABNORMAL HIGH (ref 70–99)
Potassium: 2.7 mmol/L — CL (ref 3.5–5.1)
Sodium: 140 mmol/L (ref 135–145)
Total Bilirubin: 0.4 mg/dL (ref 0.0–1.2)
Total Protein: 6.2 g/dL — ABNORMAL LOW (ref 6.5–8.1)

## 2024-06-15 MED ORDER — HYDROMORPHONE HCL 1 MG/ML IJ SOLN
1.0000 mg | Freq: Once | INTRAMUSCULAR | Status: AC
  Administered 2024-06-15: 1 mg via INTRAVENOUS
  Filled 2024-06-15: qty 1

## 2024-06-15 MED ORDER — POTASSIUM CHLORIDE 10 MEQ/100ML IV SOLN
10.0000 meq | INTRAVENOUS | Status: AC
  Administered 2024-06-15 (×3): 10 meq via INTRAVENOUS
  Filled 2024-06-15 (×3): qty 100

## 2024-06-15 MED ORDER — FUROSEMIDE 10 MG/ML IJ SOLN
20.0000 mg | Freq: Once | INTRAMUSCULAR | Status: AC
  Administered 2024-06-15: 20 mg via INTRAVENOUS
  Filled 2024-06-15: qty 2

## 2024-06-15 MED ORDER — SODIUM CHLORIDE 0.9 % IV SOLN: INTRAVENOUS | Status: DC

## 2024-06-15 NOTE — Patient Instructions (Signed)
 Hypokalemia Hypokalemia means that the amount of potassium in the blood is lower than normal. Potassium is a mineral (electrolyte) that helps regulate the amount of fluid in the body. It also stimulates muscle tightening (contraction) and helps nerves work properly. Normally, most of the body's potassium is inside cells, and only a very small amount is in the blood. Because the amount in the blood is so small, minor changes to potassium levels in the blood can be life-threatening. What are the causes? This condition may be caused by: Antibiotic medicine. Diarrhea or vomiting. Taking too much of a medicine that helps you have a bowel movement (laxative) can cause diarrhea and lead to hypokalemia. Chronic kidney disease (CKD). Medicines that help the body get rid of excess fluid (diuretics). Eating disorders, such as anorexia or bulimia. Low magnesium levels in the body. Sweating a lot. What are the signs or symptoms? Symptoms of this condition include: Weakness. Constipation. Fatigue. Muscle cramps. Mental confusion. Skipped heartbeats or irregular heartbeat (palpitations). Tingling or numbness. How is this diagnosed? This condition is diagnosed with a blood test. How is this treated? This condition may be treated by: Taking potassium supplements. Adjusting the medicines that you take. Eating more foods that contain a lot of potassium. If your potassium level is very low, you may need to get potassium through an IV and be monitored in the hospital. Follow these instructions at home: Eating and drinking  Eat a healthy diet. A healthy diet includes fresh fruits and vegetables, whole grains, healthy fats, and lean proteins. If told, eat more foods that contain a lot of potassium. These include: Nuts, such as peanuts and pistachios. Seeds, such as sunflower seeds and pumpkin seeds. Peas, lentils, and lima beans. Whole grain and bran cereals and breads. Fresh fruits and vegetables,  such as apricots, avocado, bananas, cantaloupe, kiwi, oranges, tomatoes, asparagus, and potatoes. Juices, such as orange, tomato, and prune. Lean meats, including fish. Milk and milk products, such as yogurt. General instructions Take over-the-counter and prescription medicines only as told by your health care provider. This includes vitamins, natural food products, and supplements. Keep all follow-up visits. This is important. Contact a health care provider if: You have weakness that gets worse. You feel your heart pounding or racing. You vomit. You have diarrhea. You have diabetes and you have trouble keeping your blood sugar in your target range. Get help right away if: You have chest pain. You have shortness of breath. You have vomiting or diarrhea that lasts for more than 2 days. You faint. These symptoms may be an emergency. Get help right away. Call 911. Do not wait to see if the symptoms will go away. Do not drive yourself to the hospital. Summary Hypokalemia means that the amount of potassium in the blood is lower than normal. This condition is diagnosed with a blood test. Hypokalemia may be treated by taking potassium supplements, adjusting the medicines that you take, or eating more foods that are high in potassium. If your potassium level is very low, you may need to get potassium through an IV and be monitored in the hospital. This information is not intended to replace advice given to you by your health care provider. Make sure you discuss any questions you have with your health care provider. Document Revised: 04/13/2021 Document Reviewed: 04/13/2021 Elsevier Patient Education  2024 [employer].

## 2024-06-15 NOTE — Progress Notes (Signed)
 CRITICAL VALUE STICKER  CRITICAL VALUE:  K+ 2.7  RECEIVER (on-site recipient of call):  Woodie Kapur RN  DATE & TIME NOTIFIED:   06/15/2024 @ 1018  MESSENGER (representative from lab):  [facility]  MD NOTIFIED:   Eleanor Bach NP  TIME OF NOTIFICATION:  1120  RESPONSE:   IV K+ today.

## 2024-06-18 ENCOUNTER — Other Ambulatory Visit: Payer: Self-pay | Admitting: Hematology and Oncology

## 2024-06-18 ENCOUNTER — Inpatient Hospital Stay

## 2024-06-18 ENCOUNTER — Encounter: Payer: Self-pay | Admitting: Oncology

## 2024-06-18 VITALS — BP 114/85 | HR 100 | Temp 98.4°F | Resp 20

## 2024-06-18 DIAGNOSIS — C7951 Secondary malignant neoplasm of bone: Secondary | ICD-10-CM

## 2024-06-18 DIAGNOSIS — C50412 Malignant neoplasm of upper-outer quadrant of left female breast: Secondary | ICD-10-CM | POA: Diagnosis not present

## 2024-06-18 LAB — CBC WITH DIFFERENTIAL (CANCER CENTER ONLY)
Abs Immature Granulocytes: 0.08 K/uL — ABNORMAL HIGH (ref 0.00–0.07)
Basophils Absolute: 0 K/uL (ref 0.0–0.1)
Basophils Relative: 0 %
Eosinophils Absolute: 0.1 K/uL (ref 0.0–0.5)
Eosinophils Relative: 1 %
HCT: 36 % (ref 36.0–46.0)
Hemoglobin: 11.7 g/dL — ABNORMAL LOW (ref 12.0–15.0)
Immature Granulocytes: 1 %
Lymphocytes Relative: 4 %
Lymphs Abs: 0.5 K/uL — ABNORMAL LOW (ref 0.7–4.0)
MCH: 26 pg (ref 26.0–34.0)
MCHC: 32.5 g/dL (ref 30.0–36.0)
MCV: 80 fL (ref 80.0–100.0)
Monocytes Absolute: 0.3 K/uL (ref 0.1–1.0)
Monocytes Relative: 3 %
Neutro Abs: 11.4 K/uL — ABNORMAL HIGH (ref 1.7–7.7)
Neutrophils Relative %: 91 %
Platelet Count: 298 K/uL (ref 150–400)
RBC: 4.5 MIL/uL (ref 3.87–5.11)
RDW: 16.4 % — ABNORMAL HIGH (ref 11.5–15.5)
WBC Count: 12.4 K/uL — ABNORMAL HIGH (ref 4.0–10.5)
nRBC: 0 % (ref 0.0–0.2)

## 2024-06-18 LAB — CMP (CANCER CENTER ONLY)
ALT: 16 U/L (ref 0–44)
AST: 34 U/L (ref 15–41)
Albumin: 3.8 g/dL (ref 3.5–5.0)
Alkaline Phosphatase: 158 U/L — ABNORMAL HIGH (ref 38–126)
Anion gap: 14 (ref 5–15)
BUN: 13 mg/dL (ref 6–20)
CO2: 31 mmol/L (ref 22–32)
Calcium: 10.9 mg/dL — ABNORMAL HIGH (ref 8.9–10.3)
Chloride: 91 mmol/L — ABNORMAL LOW (ref 98–111)
Creatinine: 0.59 mg/dL (ref 0.44–1.00)
GFR, Estimated: >60 mL/min (ref >60)
Glucose, Bld: 121 mg/dL — ABNORMAL HIGH (ref 70–99)
Potassium: 2.8 mmol/L — ABNORMAL LOW (ref 3.5–5.1)
Sodium: 135 mmol/L (ref 135–145)
Total Bilirubin: 0.5 mg/dL (ref 0.0–1.2)
Total Protein: 6.3 g/dL — ABNORMAL LOW (ref 6.5–8.1)

## 2024-06-18 LAB — MAGNESIUM: Magnesium: 1.3 mg/dL — ABNORMAL LOW (ref 1.7–2.4)

## 2024-06-18 MED ORDER — SODIUM CHLORIDE 0.9 % IV SOLN: INTRAVENOUS | Status: DC | PRN

## 2024-06-18 MED ORDER — POTASSIUM CHLORIDE 10 MEQ/100ML IV SOLN
10.0000 meq | INTRAVENOUS | Status: AC
  Administered 2024-06-18 (×3): 10 meq via INTRAVENOUS
  Filled 2024-06-18 (×3): qty 100

## 2024-06-18 MED ORDER — HYDROMORPHONE HCL 1 MG/ML IJ SOLN
1.0000 mg | Freq: Once | INTRAMUSCULAR | Status: AC
  Administered 2024-06-18: 1 mg via INTRAVENOUS
  Filled 2024-06-18: qty 1

## 2024-06-18 MED ORDER — MAGNESIUM SULFATE 4 GM/100ML IV SOLN
4.0000 g | Freq: Once | INTRAVENOUS | Status: AC
  Administered 2024-06-18: 4 g via INTRAVENOUS
  Filled 2024-06-18: qty 100

## 2024-06-18 NOTE — Patient Instructions (Signed)
 Magnesium Sulfate Injection What is this medication? MAGNESIUM SULFATE (mag NEE zee um SUL fate) prevents and treats low levels of magnesium in your body. It may also be used to prevent and treat seizures during pregnancy in people with high blood pressure disorders, such as preeclampsia or eclampsia. Magnesium plays an important role in maintaining the health of your muscles and nervous system. This medicine may be used for other purposes; ask your health care provider or pharmacist if you have questions. What should I tell my care team before I take this medication? They need to know if you have any of these conditions: Heart disease History of irregular heart beat Kidney disease An unusual or allergic reaction to magnesium sulfate, medications, foods, dyes, or preservatives Pregnant or trying to get pregnant Breast-feeding How should I use this medication? This medication is for infusion into a vein. It is given in a hospital or clinic setting. Talk to your care team about the use of this medication in children. While this medication may be prescribed for selected conditions, precautions do apply. Overdosage: If you think you have taken too much of this medicine contact a poison control center or emergency room at once. NOTE: This medicine is only for you. Do not share this medicine with others. What if I miss a dose? This does not apply. What may interact with this medication? Certain medications for anxiety or sleep Certain medications for seizures, such phenobarbital Digoxin Medications that relax muscles for surgery Narcotic medications for pain This list may not describe all possible interactions. Give your health care provider a list of all the medicines, herbs, non-prescription drugs, or dietary supplements you use. Also tell them if you smoke, drink alcohol, or use illegal drugs. Some items may interact with your medicine. What should I watch for while using this  medication? Your condition will be monitored carefully while you are receiving this medication. You may need blood work done while you are receiving this medication. What side effects may I notice from receiving this medication? Side effects that you should report to your care team as soon as possible: Allergic reactions--skin rash, itching, hives, swelling of the face, lips, tongue, or throat High magnesium level--confusion, drowsiness, facial flushing, redness, sweating, muscle weakness, fast or irregular heartbeat, trouble breathing Low blood pressure--dizziness, feeling faint or lightheaded, blurry vision Side effects that usually do not require medical attention (report to your care team if they continue or are bothersome): Headache Nausea This list may not describe all possible side effects. Call your doctor for medical advice about side effects. You may report side effects to FDA at 1-800-FDA-1088. Where should I keep my medication? This medication is given in a hospital or clinic and will not be stored at home. NOTE: This sheet is a summary. It may not cover all possible information. If you have questions about this medicine, talk to your doctor, pharmacist, or health care provider.  2024 Elsevier/Gold Standard (2021-04-12 00:00:00) Potassium Chloride Injection What is this medication? POTASSIUM CHLORIDE (poe TASS i um KLOOR ide) prevents and treats low levels of potassium in your body. Potassium plays an important role in maintaining the health of your kidneys, heart, muscles, and nervous system. This medicine may be used for other purposes; ask your health care provider or pharmacist if you have questions. COMMON BRAND NAME(S): PROAMP What should I tell my care team before I take this medication? They need to know if you have any of these conditions: Addison disease Dehydration Diabetes (high blood  sugar) Heart disease High levels of potassium in the blood Irregular heartbeat or  rhythm Kidney disease Large areas of burned skin An unusual or allergic reaction to potassium, other medications, foods, dyes, or preservatives Pregnant or trying to get pregnant Breast-feeding How should I use this medication? This medication is injected into a vein. It is given in a hospital or clinic setting. Talk to your care team about the use of this medication in children. Special care may be needed. Overdosage: If you think you have taken too much of this medicine contact a poison control center or emergency room at once. NOTE: This medicine is only for you. Do not share this medicine with others. What if I miss a dose? This does not apply. This medication is not for regular use. What may interact with this medication? Do not take this medication with any of the following: Certain diuretics, such as spironolactone, triamterene Eplerenone Sodium polystyrene sulfonate This medication may also interact with the following: Certain medications for blood pressure or heart disease, such as lisinopril, losartan, quinapril, valsartan Medications that lower your chance of fighting infection, such as cyclosporine, tacrolimus NSAIDs, medications for pain and inflammation, such as ibuprofen or naproxen Other potassium supplements Salt substitutes This list may not describe all possible interactions. Give your health care provider a list of all the medicines, herbs, non-prescription drugs, or dietary supplements you use. Also tell them if you smoke, drink alcohol, or use illegal drugs. Some items may interact with your medicine. What should I watch for while using this medication? Visit your care team for regular checks on your progress. Tell your care team if your symptoms do not start to get better or if they get worse. You may need blood work while you are taking this medication. Avoid salt substitutes unless you are told otherwise by your care team. What side effects may I notice from  receiving this medication? Side effects that you should report to your care team as soon as possible: Allergic reactions--skin rash, itching, hives, swelling of the face, lips, tongue, or throat High potassium level--muscle weakness, fast or irregular heartbeat Side effects that usually do not require medical attention (report to your care team if they continue or are bothersome): Diarrhea Nausea Stomach pain Vomiting This list may not describe all possible side effects. Call your doctor for medical advice about side effects. You may report side effects to FDA at 1-800-FDA-1088. Where should I keep my medication? This medication is given in a hospital or clinic. It will not be stored at home. NOTE: This sheet is a summary. It may not cover all possible information. If you have questions about this medicine, talk to your doctor, pharmacist, or health care provider.  2024 Elsevier/Gold Standard (2022-02-09 00:00:00)

## 2024-06-22 ENCOUNTER — Inpatient Hospital Stay

## 2024-06-22 ENCOUNTER — Telehealth: Payer: Self-pay

## 2024-06-22 ENCOUNTER — Other Ambulatory Visit: Payer: Self-pay | Admitting: Hematology and Oncology

## 2024-06-22 VITALS — BP 111/76 | HR 118 | Temp 98.4°F | Resp 20

## 2024-06-22 DIAGNOSIS — C7951 Secondary malignant neoplasm of bone: Secondary | ICD-10-CM

## 2024-06-22 DIAGNOSIS — C50412 Malignant neoplasm of upper-outer quadrant of left female breast: Secondary | ICD-10-CM | POA: Diagnosis not present

## 2024-06-22 LAB — CMP (CANCER CENTER ONLY)
ALT: 21 U/L (ref 0–44)
AST: 31 U/L (ref 15–41)
Albumin: 3.6 g/dL (ref 3.5–5.0)
Alkaline Phosphatase: 166 U/L — ABNORMAL HIGH (ref 38–126)
Anion gap: 12 (ref 5–15)
BUN: 12 mg/dL (ref 6–20)
CO2: 33 mmol/L — ABNORMAL HIGH (ref 22–32)
Calcium: 13.5 mg/dL (ref 8.9–10.3)
Chloride: 90 mmol/L — ABNORMAL LOW (ref 98–111)
Creatinine: 0.66 mg/dL (ref 0.44–1.00)
GFR, Estimated: >60 mL/min (ref >60)
Glucose, Bld: 209 mg/dL — ABNORMAL HIGH (ref 70–99)
Potassium: 2.7 mmol/L — CL (ref 3.5–5.1)
Sodium: 135 mmol/L (ref 135–145)
Total Bilirubin: 0.6 mg/dL (ref 0.0–1.2)
Total Protein: 6.2 g/dL — ABNORMAL LOW (ref 6.5–8.1)

## 2024-06-22 LAB — CBC WITH DIFFERENTIAL (CANCER CENTER ONLY)
Abs Immature Granulocytes: 0.05 K/uL (ref 0.00–0.07)
Basophils Absolute: 0 K/uL (ref 0.0–0.1)
Basophils Relative: 0 %
Eosinophils Absolute: 0 K/uL (ref 0.0–0.5)
Eosinophils Relative: 0 %
HCT: 34.7 % — ABNORMAL LOW (ref 36.0–46.0)
Hemoglobin: 11.1 g/dL — ABNORMAL LOW (ref 12.0–15.0)
Immature Granulocytes: 1 %
Lymphocytes Relative: 7 %
Lymphs Abs: 0.5 K/uL — ABNORMAL LOW (ref 0.7–4.0)
MCH: 25.7 pg — ABNORMAL LOW (ref 26.0–34.0)
MCHC: 32 g/dL (ref 30.0–36.0)
MCV: 80.3 fL (ref 80.0–100.0)
Monocytes Absolute: 0.5 K/uL (ref 0.1–1.0)
Monocytes Relative: 6 %
Neutro Abs: 6.7 K/uL (ref 1.7–7.7)
Neutrophils Relative %: 86 %
Platelet Count: 376 K/uL (ref 150–400)
RBC: 4.32 MIL/uL (ref 3.87–5.11)
RDW: 16.4 % — ABNORMAL HIGH (ref 11.5–15.5)
WBC Count: 7.8 K/uL (ref 4.0–10.5)
nRBC: 0 % (ref 0.0–0.2)

## 2024-06-22 LAB — MAGNESIUM: Magnesium: 1.4 mg/dL — ABNORMAL LOW (ref 1.7–2.4)

## 2024-06-22 MED ORDER — FUROSEMIDE 10 MG/ML IJ SOLN
20.0000 mg | Freq: Once | INTRAMUSCULAR | Status: AC
  Administered 2024-06-22: 20 mg via INTRAVENOUS
  Filled 2024-06-22: qty 2

## 2024-06-22 MED ORDER — SODIUM CHLORIDE 0.9 % IV SOLN: Freq: Once | INTRAVENOUS | Status: AC

## 2024-06-22 MED ORDER — HYDROMORPHONE HCL 1 MG/ML IJ SOLN
1.0000 mg | Freq: Once | INTRAMUSCULAR | Status: AC
  Administered 2024-06-22: 1 mg via INTRAVENOUS
  Filled 2024-06-22: qty 1

## 2024-06-22 MED ORDER — MAGNESIUM SULFATE 4 GM/100ML IV SOLN
4.0000 g | Freq: Once | INTRAVENOUS | Status: AC
  Administered 2024-06-22: 4 g via INTRAVENOUS
  Filled 2024-06-22: qty 100

## 2024-06-22 MED ORDER — POTASSIUM CHLORIDE 10 MEQ/100ML IV SOLN
10.0000 meq | INTRAVENOUS | Status: AC
  Administered 2024-06-22 (×4): 10 meq via INTRAVENOUS
  Filled 2024-06-22 (×4): qty 100

## 2024-06-22 NOTE — Patient Instructions (Signed)
 Potassium Chloride Injection What is this medication? POTASSIUM CHLORIDE (poe TASS i um KLOOR ide) prevents and treats low levels of potassium in your body. Potassium plays an important role in maintaining the health of your kidneys, heart, muscles, and nervous system. This medicine may be used for other purposes; ask your health care provider or pharmacist if you have questions. COMMON BRAND NAME(S): PROAMP What should I tell my care team before I take this medication? They need to know if you have any of these conditions: Addison disease Dehydration Diabetes (high blood sugar) Heart disease High levels of potassium in the blood Irregular heartbeat or rhythm Kidney disease Large areas of burned skin An unusual or allergic reaction to potassium, other medications, foods, dyes, or preservatives Pregnant or trying to get pregnant Breast-feeding How should I use this medication? This medication is injected into a vein. It is given in a hospital or clinic setting. Talk to your care team about the use of this medication in children. Special care may be needed. Overdosage: If you think you have taken too much of this medicine contact a poison control center or emergency room at once. NOTE: This medicine is only for you. Do not share this medicine with others. What if I miss a dose? This does not apply. This medication is not for regular use. What may interact with this medication? Do not take this medication with any of the following: Certain diuretics, such as spironolactone, triamterene Eplerenone Sodium polystyrene sulfonate This medication may also interact with the following: Certain medications for blood pressure or heart disease, such as lisinopril, losartan, quinapril, valsartan Medications that lower your chance of fighting infection, such as cyclosporine, tacrolimus NSAIDs, medications for pain and inflammation, such as ibuprofen or naproxen Other potassium supplements Salt  substitutes This list may not describe all possible interactions. Give your health care provider a list of all the medicines, herbs, non-prescription drugs, or dietary supplements you use. Also tell them if you smoke, drink alcohol, or use illegal drugs. Some items may interact with your medicine. What should I watch for while using this medication? Visit your care team for regular checks on your progress. Tell your care team if your symptoms do not start to get better or if they get worse. You may need blood work while you are taking this medication. Avoid salt substitutes unless you are told otherwise by your care team. What side effects may I notice from receiving this medication? Side effects that you should report to your care team as soon as possible: Allergic reactions--skin rash, itching, hives, swelling of the face, lips, tongue, or throat High potassium level--muscle weakness, fast or irregular heartbeat Side effects that usually do not require medical attention (report to your care team if they continue or are bothersome): Diarrhea Nausea Stomach pain Vomiting This list may not describe all possible side effects. Call your doctor for medical advice about side effects. You may report side effects to FDA at 1-800-FDA-1088. Where should I keep my medication? This medication is given in a hospital or clinic. It will not be stored at home. NOTE: This sheet is a summary. It may not cover all possible information. If you have questions about this medicine, talk to your doctor, pharmacist, or health care provider.  2024 Elsevier/Gold Standard (2022-02-09 00:00:00)

## 2024-06-22 NOTE — Telephone Encounter (Signed)
 CRITICAL VALUE STICKER  CRITICAL VALUE: potassium 2.7, calcium  13.5  RECEIVER (on-site recipient of call): Jon, LPN  DATE & TIME NOTIFIED: 06/22/24 0932   MESSENGER (representative from lab): [facility]  MD NOTIFIED: Eleanor Bach, FNP  TIME OF NOTIFICATION: [phone]   RESPONSE:  Will arrange for potassium and IVF with the infusion room.

## 2024-06-24 ENCOUNTER — Other Ambulatory Visit (HOSPITAL_COMMUNITY): Payer: Self-pay

## 2024-06-24 ENCOUNTER — Other Ambulatory Visit (HOSPITAL_BASED_OUTPATIENT_CLINIC_OR_DEPARTMENT_OTHER): Payer: Self-pay

## 2024-06-24 ENCOUNTER — Other Ambulatory Visit: Payer: Self-pay | Admitting: Oncology

## 2024-06-24 ENCOUNTER — Telehealth: Payer: Self-pay

## 2024-06-24 ENCOUNTER — Inpatient Hospital Stay (HOSPITAL_BASED_OUTPATIENT_CLINIC_OR_DEPARTMENT_OTHER): Admitting: Hematology and Oncology

## 2024-06-24 ENCOUNTER — Other Ambulatory Visit: Payer: Self-pay | Admitting: Hematology and Oncology

## 2024-06-24 ENCOUNTER — Inpatient Hospital Stay

## 2024-06-24 ENCOUNTER — Encounter: Payer: Self-pay | Admitting: Oncology

## 2024-06-24 ENCOUNTER — Other Ambulatory Visit: Payer: Self-pay

## 2024-06-24 VITALS — BP 99/74 | HR 107 | Temp 98.4°F | Resp 18

## 2024-06-24 DIAGNOSIS — C7951 Secondary malignant neoplasm of bone: Secondary | ICD-10-CM

## 2024-06-24 DIAGNOSIS — C50412 Malignant neoplasm of upper-outer quadrant of left female breast: Secondary | ICD-10-CM | POA: Diagnosis not present

## 2024-06-24 LAB — CBC WITH DIFFERENTIAL (CANCER CENTER ONLY)
Abs Immature Granulocytes: 0.03 K/uL (ref 0.00–0.07)
Basophils Absolute: 0 K/uL (ref 0.0–0.1)
Basophils Relative: 1 %
Eosinophils Absolute: 0 K/uL (ref 0.0–0.5)
Eosinophils Relative: 1 %
HCT: 30.6 % — ABNORMAL LOW (ref 36.0–46.0)
Hemoglobin: 9.9 g/dL — ABNORMAL LOW (ref 12.0–15.0)
Immature Granulocytes: 1 %
Lymphocytes Relative: 9 %
Lymphs Abs: 0.2 K/uL — ABNORMAL LOW (ref 0.7–4.0)
MCH: 25.8 pg — ABNORMAL LOW (ref 26.0–34.0)
MCHC: 32.4 g/dL (ref 30.0–36.0)
MCV: 79.9 fL — ABNORMAL LOW (ref 80.0–100.0)
Monocytes Absolute: 0.4 K/uL (ref 0.1–1.0)
Monocytes Relative: 15 %
Neutro Abs: 1.8 K/uL (ref 1.7–7.7)
Neutrophils Relative %: 73 %
Platelet Count: 361 K/uL (ref 150–400)
RBC: 3.83 MIL/uL — ABNORMAL LOW (ref 3.87–5.11)
RDW: 16.7 % — ABNORMAL HIGH (ref 11.5–15.5)
Smear Review: NORMAL
WBC Count: 2.4 K/uL — ABNORMAL LOW (ref 4.0–10.5)
nRBC: 0 % (ref 0.0–0.2)

## 2024-06-24 LAB — CMP (CANCER CENTER ONLY)
ALT: 20 U/L (ref 0–44)
AST: 34 U/L (ref 15–41)
Albumin: 3.2 g/dL — ABNORMAL LOW (ref 3.5–5.0)
Alkaline Phosphatase: 188 U/L — ABNORMAL HIGH (ref 38–126)
Anion gap: 9 (ref 5–15)
BUN: 13 mg/dL (ref 6–20)
CO2: 34 mmol/L — ABNORMAL HIGH (ref 22–32)
Calcium: 13 mg/dL — ABNORMAL HIGH (ref 8.9–10.3)
Chloride: 89 mmol/L — ABNORMAL LOW (ref 98–111)
Creatinine: 0.68 mg/dL (ref 0.44–1.00)
GFR, Estimated: >60 mL/min (ref >60)
Glucose, Bld: 170 mg/dL — ABNORMAL HIGH (ref 70–99)
Potassium: 2.7 mmol/L — CL (ref 3.5–5.1)
Sodium: 132 mmol/L — ABNORMAL LOW (ref 135–145)
Total Bilirubin: 0.6 mg/dL (ref 0.0–1.2)
Total Protein: 5.7 g/dL — ABNORMAL LOW (ref 6.5–8.1)

## 2024-06-24 LAB — PHOSPHORUS: Phosphorus: 2.3 mg/dL — ABNORMAL LOW (ref 2.5–4.6)

## 2024-06-24 LAB — MAGNESIUM: Magnesium: 1.6 mg/dL — ABNORMAL LOW (ref 1.7–2.4)

## 2024-06-24 MED ORDER — POTASSIUM CHLORIDE 10 MEQ/100ML IV SOLN
10.0000 meq | INTRAVENOUS | Status: AC
  Administered 2024-06-24 (×3): 10 meq via INTRAVENOUS
  Filled 2024-06-24 (×3): qty 100

## 2024-06-24 MED ORDER — FLUCONAZOLE 150 MG PO TABS: 150.0000 mg | ORAL_TABLET | Freq: Every day | ORAL | 3 refills | Status: DC

## 2024-06-24 MED ORDER — HYDROMORPHONE HCL 1 MG/ML IJ SOLN
1.0000 mg | Freq: Once | INTRAMUSCULAR | Status: AC
  Administered 2024-06-24: 1 mg via INTRAVENOUS
  Filled 2024-06-24: qty 1

## 2024-06-24 MED ORDER — DEXAMETHASONE 4 MG PO TABS: 4.0000 mg | ORAL_TABLET | Freq: Every day | ORAL | 3 refills | Status: DC

## 2024-06-24 MED ORDER — DEXAMETHASONE 4 MG PO TABS
4.0000 mg | ORAL_TABLET | Freq: Every day | ORAL | 3 refills | Status: DC
  Filled 2024-06-24: qty 30, 30d supply, fill #0

## 2024-06-24 MED ORDER — SODIUM CHLORIDE 0.9 % IV SOLN: INTRAVENOUS | Status: DC

## 2024-06-24 MED ORDER — MAGNESIUM SULFATE 2 GM/50ML IV SOLN
2.0000 g | Freq: Once | INTRAVENOUS | Status: AC
  Administered 2024-06-24: 2 g via INTRAVENOUS
  Filled 2024-06-24: qty 50

## 2024-06-24 MED ORDER — FUROSEMIDE 10 MG/ML IJ SOLN
20.0000 mg | Freq: Once | INTRAMUSCULAR | Status: AC
  Administered 2024-06-24: 20 mg via INTRAVENOUS
  Filled 2024-06-24: qty 2

## 2024-06-24 MED ORDER — FLUCONAZOLE 150 MG PO TABS
150.0000 mg | ORAL_TABLET | Freq: Every day | ORAL | 3 refills | Status: DC
  Filled 2024-06-24: qty 5, 5d supply, fill #0

## 2024-06-24 NOTE — Progress Notes (Signed)
 [facility]  [address] [place],  [place]  [address] [phone] Clinic Day: 06/24/2024  Referring physician: Pia Kerney SQUIBB, MD  ASSESSMENT & PLAN:  Assessment: Malignant neoplasm metastatic to bone [facility]) Bone metastases diagnosed in November 2022, with multiple marrow replacing lesions within the proximal right femur within the ischium as well as the inferior rami. 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 Ki67 was 60%. PET imaging in December revealed dominant finding of intensely hypermetabolic aggressive skeletal metastasis involving the calvarium, sternum, thoracic and lumbar spine, and pelvis, with potential pathologic fracture of the right femur with internal fixation. There was soft tissue extension into the anterior mediastinum associated with the manubrial metastasis. Multiple spinal vertebral body lesions had cortical destruction along the posterior margin. She started monthly Xgeva  in December, and completed radiation therapy. She continued with Herceptin /Perjeta  until she had progression of disease. In January 2024, she developed increased pain of the left hip. She was found to have innumerable metastatic lesions throughout the pelvis and proximal femur. There is a non displaced pathologic fracture through a large lesion in the superior left acetabulum. She was referred to an orthopedic oncologist at [facility] and they recommended radiation and no surgery. She received her palliative radiation.    We then switched her to Enhertu  chemotherapy in March of 2024, but she has had significant toxicities of nausea, vomiting, diarrhea, and hypokalemia.  She was also placed on hormonal therapy with Faslodex  injections.  Repeat CT imaging in July revealed decreased soft tissue thickening around the left subclavian structures and thoracic inlet.  Left lateral breast mass was stable.   Widespread mixed lytic and sclerotic osseous metastatic lesions were stable.  There was unchanged severe elevation of the left hemidiaphragm. CT neck in October to evaluate for possible soft tissue mass and painful swallowing did not reveal any soft tissue masses.  The extensive mixed lytic and sclerotic skeletal metastases were much more pronounced in the visible upper thorax than in the cervical spine and were felt to be grossly stable from July 2024. CT chest, abdomen and pelvis in November revealed stable bony disease without evidence of new/progressive disease.  Due to the development of osteonecrosis of the jaw, denosumab  was discontinued 2025   She completed 18 cycles of Enhertu , which was then placed on hold due to severe anemia.  She required multiple units of blood. She was found to be iron  deficient and received IV iron  in the form of Venofer  for 5 doses.  We resumed Enhertu  in April.  She has continued fulvestrant  monthly.  In May, patient requested she receive Enhertu  every 4 weeks.   She was referred to Dr. Charlanne to consider evaluation of the iron  deficiency.  He will hold off on a colonoscopy. He performed an EGD 03/13/2024 which revealed both gastric and duodenal ulcers as well as esophageal stricture.   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  She was treated with lumpectomy followed by adjuvant chemotherapy and radiation therapy. She was on adjuvant hormonal therapy with tamoxifen 20 mg daily from October 2015 to May 2020, but due to elevation of the liver transaminases was switched to anastrazole.  She then developed bone metastasis in November 2022 so has been receiving chemotherapy. She had skin thickening on her last mammogram, but a nodule in  the left breast was stable on CT imaging. The skin thickening is felt to possibly due to radiation, as her exam remained  stable. Continued mammograms are not recommended.   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 She developed pain of the right jaw in February 2025.  Panorex x-rays revealed an abnormal area in the right lower jaw consistent with osteonecrosis.  Xgeva  was discontinued.  She was treated with Augmentin  in February.  She has been working closely with her dentist. She has had 2 fillings done, but also may need root canals.  She sees him again on April 8.  She had swelling and erythema of the left jaw earlier this month, so was placed on amoxicillin  4 times daily, with resolution of the swelling and erythema.  The redness and swelling have resolved, but she states she still has some mild tenderness of the anterior left jaw.  She has had recurrent swelling and erythema and requires intermittent courses of Amoxicillin .    Iron  deficiency anemia New iron  deficiency anemia diagnosed in March of 2025, when she presented with acute worsening of her anemia. She required transfusion of 3 units of PRBC's. She then received IV iron  in the form of Venofer  for 5 doses. She is taking oral iron  now.   Her hemoglobin has slightly improved, which may be due to hemoconcentration.  She was referred to Dr. Charlanne to consider evaluation of the iron  deficiency. He will hold off on a colonoscopy. He performed an EGD 03/13/2024 which revealed both gastric and duodenal ulcers as well as esophageal stricture. He did recommend increasing the omeprazole  to 40 mg BID and added Carafate .  Hypercalcemia Calcium  today is 13.0. She noted previously that she had significantly increased her oral intake of calcium  and was told to decrease her intake. We will plan for aggressive IVF today as she is unable to have biphosphonates due to history of osteonecrosis of the jaw.   Plan: She comes in today for labs and IVF. Her mother is present with her today and wishes to discuss current condition. Dr. Cornelius at  chairside with me during this assessment. Her husband, Chyrl is also available via telephone. Today, she is much weaker than she has been in the past to the point that she is unable to get out of the car on her own. Her mother states that she has been unable to eat for the last five days. She is also unable to drink. Her calcium  remains elevated at 13 today with potassium 2.7 and magnesium  1.6. We will replace both potassium and magnesium  and give IVF with lasix  20 mg for hypercalcemia. We discussed that the continued hypercalcemia is most likely an indication that her disease is progressing. We are still unable to give biphosphonates due to osteonecrosis of the jaw. We will plan for CT chest/abdomen and pelvis as well as MRI of the brain. We also discussed potential treatment plans including an oral drug for her PIK mutation or additional IV chemotherapy. Hospice care was also discussed as well as code status. These are things she will discuss and decide with her husband. We encouraged her to increase her po intake, especially protein. She was noted to have thrush, so I will send in Diflucan  in hopes of alleviating this making it easier for her to eat. I will also send in dexamethasone  4 mg to take daily in hopes of increasing her appetite and helping with bone pain. We will have her  return Friday for repeat labs and most likely IVF.   I provided 30 minutes of face-to-face time during this encounter and > 50% was spent counseling as documented under my assessment and plan.   Anna Bach, FNP- [facility] [place] - A DEPT OF [place] [address] [place] [address] Dept: [phone] Dept Fax: [phone]   Orders Placed This Encounter  Procedures   MR Brain W Wo Contrast    Standing Status:   Future    Expiration Date:   06/24/2025    If indicated for the ordered procedure, I authorize the administration of contrast media per Radiology  protocol:   Yes    What is the patient's sedation requirement?:   No Sedation    Does the patient have a pacemaker or implanted devices?:   No    Use SRS Protocol?:   No    Preferred imaging location?:   [facility]   CT CHEST ABDOMEN PELVIS W CONTRAST    Standing Status:   Future    Expiration Date:   06/24/2025    If indicated for the ordered procedure, I authorize the administration of contrast media per Radiology protocol:   Yes    Does the patient have a contrast media/X-ray dye allergy?:   No    Is patient pregnant?:   No    Preferred imaging location?:   [facility]    If indicated for the ordered procedure, I authorize the administration of oral contrast media per Radiology protocol:   Yes   Phosphorus    Standing Status:   Future    Expiration Date:   06/24/2025    CHIEF COMPLAINT:  CC: Metastatic hormone and HER2 receptor positive breast cancer  Current Treatment: Enhertu  every 4 weeks  HISTORY OF PRESENT ILLNESS:   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 (HCC)   07/23/2013 Cancer Staging   Staging form: Breast, AJCC 7th Edition - Clinical stage from 07/23/2013: Stage IIB (T2, N1, M0) - Signed by Cornelius Wanda DEL, MD on 07/04/2020 Prognostic indicators: Pos LVI   09/22/2021 - 04/06/2022 Chemotherapy   Patient is on Treatment Plan : BREAST  Trastuzumab  + Pertuzumab  q21d      09/22/2021 - 09/21/2022 Chemotherapy   Patient is on Treatment Plan : BREAST Trastuzumab  + Pertuzumab  q21d     10/12/2022 - 06/12/2024 Chemotherapy   Patient is on Treatment Plan : BREAST METASTATIC Fam-Trastuzumab Deruxtecan-nxki  (Enhertu ) (5.4) q28d (changed from q21d on 12/25/23)     Malignant neoplasm metastatic to bone (HCC)  06/28/2021 Initial Diagnosis   Bone metastases (HCC)   09/22/2021 - 04/06/2022 Chemotherapy   Patient is on  Treatment Plan : BREAST  Trastuzumab  + Pertuzumab  q21d      09/22/2021 - 09/21/2022 Chemotherapy   Patient is on Treatment Plan : BREAST Trastuzumab  + Pertuzumab  q21d     05/11/2022 Imaging   CT chest, abdomen and pelvis:  IMPRESSION:  1. Widespread bony metastatic disease shows increased sclerosis  since previous imaging. This is likely related to response to  therapy in the interval. There is a single lesion along the LEFT  iliac which shows continued lytic change and warrants attention on  follow-up  2. Interval development of pathologic fractures at in the upper  thoracic spine and at the thoracolumbar junction with associated  kyphosis.  3. No signs of solid organ or nodal metastatic disease.  4. Marked elevation of the LEFT hemidiaphragm as on the prior PET.  5. Aortic atherosclerosis.    10/12/2022 - 06/12/2024 Chemotherapy   Patient is on Treatment Plan : BREAST METASTATIC Fam-Trastuzumab Deruxtecan-nxki  (Enhertu ) (5.4) q28d (changed from q21d on 12/25/23)       INTERVAL HISTORY:  Deltha is here today for repeat clinical assessment for her metastatic hormone and HER2 receptor positive breast cancer. Patient states that she feels well and complains of lower back pain rated 4/10. She visited the dentist, where she was diagnosed with a tooth infection. She was prescribed Augmentin , but has not yet started this treatment. I encouraged her to do so since it should also help her ONJ. She had an upper GI endoscopy performed on 03/13/2024 by Dr. Charlanne which revealed an esophageal stricture, one 6 mm non-bleeding cratered gastric ulcer with surrounding atrophic gastritis, and one 12 mm non-bleeding cratered duodenal ulcer . I explained these results to the patient and her husband and they understand. Dr. Charlanne recommended she increase her 40 mg omeprazole  to BID and added Carafate  1 gram four times daily for the ulcers and I agree with this. I reminded her to take Carafate  separately from her  other medications as it coats the stomach lining and can prevent absorption. Based on her past CBC results, I suspect that she had a significant amount of bleeding from the ulcers at the beginning of the year that has since discontinued. She is currently taking 1 Aspirin a day. I cautioned her to avoid NSAIDs as these can cause gastritis and ulcers. I recommended she continue oxycodone  for her bone pain.  We will see her back in 4 weeks with CBC, CMP, iron , TIBC, and ferritin for her next cycle of Enhertu . She denies fever, chills, night sweats, or other signs of infection. She denies cardiorespiratory and gastrointestinal issues. She  denies pain. Her appetite is good and Her weight has been stable at 129lb 9.6oz.  REVIEW OF SYSTEMS:   Review of Systems  Constitutional:  Positive for appetite change and fatigue. Negative for chills, fever and unexpected weight change.  HENT:   Negative for lump/mass, mouth sores, sore throat and trouble swallowing.        Jaw swelling and erythema likely related to her ONJ  Respiratory:  Negative for cough and shortness of breath.   Cardiovascular:  Negative for chest pain and leg swelling.  Gastrointestinal:  Positive for constipation. Negative for abdominal pain, diarrhea and vomiting. Nausea: some. Genitourinary:  Negative for bladder incontinence, difficulty urinating, dysuria, frequency and hematuria.   Musculoskeletal:  Positive for back pain (Lower back 4/10) and neck pain. Negative for arthralgias, gait problem and myalgias.  Skin:  Positive for wound (Bilateral leg ulcers, newly on the left now healing). Negative for rash.  Neurological:  Negative for dizziness, extremity weakness, gait problem, headaches, light-headedness and numbness.  Hematological:  Negative for adenopathy. Does not bruise/bleed easily.  Psychiatric/Behavioral:  Negative for depression and sleep disturbance. The patient is not nervous/anxious.     VITALS:   Last menstrual period  08/18/2013.  Wt Readings from Last 3 Encounters:  06/12/24 119 lb 11.2 oz (54.3 kg)  05/15/24 128 lb 3.2 oz (58.2 kg)  04/17/24 134 lb 8 oz (61 kg)    There is no height or weight on file to calculate BMI.  Performance status (ECOG): 1 - Symptomatic but completely ambulatory    PHYSICAL EXAM:  Physical Exam Vitals and nursing note reviewed.  Constitutional:      General: She is not in acute distress.    Appearance: She is normal weight. She is ill-appearing.  HENT:     Head: Normocephalic and atraumatic.     Comments: She has some firm swelling in the submental area    Right Ear: Tympanic membrane, ear canal and external ear normal.     Left Ear: Tympanic membrane, ear canal and external ear normal.     Nose: Nose normal.     Mouth/Throat:     Mouth: Mucous membranes are dry.     Pharynx: No oropharyngeal exudate or posterior oropharyngeal erythema.  Eyes:     General: No scleral icterus.    Extraocular Movements: Extraocular movements intact.     Conjunctiva/sclera: Conjunctivae normal.     Pupils: Pupils are equal, round, and reactive to light.  Cardiovascular:     Rate and Rhythm: Normal rate and regular rhythm.     Pulses: Normal pulses.     Heart sounds: Normal heart sounds. No murmur heard.    No friction rub. No gallop.  Pulmonary:     Effort: Pulmonary effort is normal.     Breath sounds: Normal breath sounds. No wheezing, rhonchi or rales.  Chest:     Comments: Breast exam is deferred Abdominal:     General: Bowel sounds are normal. There is no distension.     Palpations: Abdomen is soft. There is no hepatomegaly, splenomegaly or mass.     Tenderness: There is no abdominal tenderness.  Musculoskeletal:        General: Normal range of motion.     Cervical back: Normal range of motion and neck supple. No tenderness.     Right lower leg: Edema (mild) present.     Left lower leg: Edema (mild) present.  Lymphadenopathy:     Cervical: No cervical adenopathy.      Upper Body:     Right upper body: No supraclavicular or axillary adenopathy.     Left upper body: No supraclavicular or axillary adenopathy.     Lower Body: No right inguinal adenopathy. No left inguinal adenopathy.  Skin:    General: Skin is warm and dry.     Coloration: Skin is not jaundiced.     Findings: No rash.  Neurological:     General: No focal deficit present.     Mental Status: She is alert and oriented to person, place, and time. Mental status is at baseline.     Cranial Nerves: No cranial nerve deficit.  Psychiatric:        Mood and Affect: Mood normal.        Behavior: Behavior normal.        Thought Content: Thought content normal.        Judgment: Judgment normal.     LABS:      Latest Ref Rng & Units 06/24/2024    8:25 AM 06/22/2024    8:44 AM 06/18/2024   10:26 AM  CBC  WBC 4.0 - 10.5 K/uL 2.4  7.8  12.4   Hemoglobin 12.0 - 15.0 g/dL 9.9  88.8  88.2   Hematocrit 36.0 - 46.0 % 30.6  34.7  36.0   Platelets 150 - 400 K/uL 361  376  298       Latest Ref Rng & Units 06/24/2024    8:25 AM 06/22/2024    8:44 AM 06/18/2024   10:26 AM  CMP  Glucose 70 - 99 mg/dL 829  790  878   BUN 6 - 20 mg/dL 13  12  13    Creatinine 0.44 - 1.00 mg/dL 9.31  9.33  9.40   Sodium 135 - 145 mmol/L 132  135  135   Potassium 3.5 - 5.1 mmol/L 2.7  2.7  2.8   Chloride 98 - 111 mmol/L 89  90  91   CO2 22 - 32 mmol/L 34  33  31   Calcium  8.9 - 10.3 mg/dL 86.9  86.4  89.0   Total Protein 6.5 - 8.1 g/dL 5.7  6.2  6.3   Total Bilirubin 0.0 - 1.2 mg/dL 0.6  0.6  0.5   Alkaline Phos 38 - 126 U/L 188  166  158   AST 15 - 41 U/L 34  31  34   ALT 0 - 44 U/L 20  21  16     Lab Results  Component Value Date   CEA1 2.2 07/20/2021   /  CEA  Date Value Ref Range Status  07/20/2021 2.2 0.0 - 4.7 ng/mL Final    Comment:    (NOTE)                             Nonsmokers          <3.9                             Smokers             <5.6 [employer] Electrochemiluminescence  Immunoassay (ECLIA) Values obtained with different assay methods or kits cannot be used interchangeably.  Results cannot be interpreted as absolute evidence of the presence or absence of malignant disease. Performed At: [facility] [address] [place], [place] [id number] Jennette Shorter MD Ey:[id number]     Lab Results  Component Value Date   TIBC 276 04/16/2024   TIBC 269 01/24/2024   TIBC 251 10/25/2023   FERRITIN 282 04/16/2024   FERRITIN 326 (H) 01/24/2024   FERRITIN 129 10/25/2023   IRONPCTSAT 13 04/16/2024   IRONPCTSAT 13 01/24/2024   IRONPCTSAT 8 (L) 10/25/2023   Lab Results  Component Value Date   LDH 143 10/25/2023   LDH 171 07/10/2023    STUDIES:   No results found.    EXAM: 09/05/2023 CT CHEST, ABDOMEN, AND PELVIS WITH CONTRAST IMPRESSION: 1. Unchanged size of the mass in the left breast with overlying skin thickening. 2. No significant interval change in the widespread osseous metastatic disease involving the axial and proximal appendicular skeleton. 3. Similar soft tissue thickening about the left subclavian structures and thoracic inlet, possibly reflecting posttreatment change. Suggest continued attention on follow-up imaging. 4. No evidence of new or progressive disease within the chest, abdomen or pelvis. 5.  Aortic Atherosclerosis (ICD10-I70.0).  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   Anxiety    Bipolar disorder (HCC) 11/29/2020   Blood transfusion without reported diagnosis    Breast cancer (HCC)    Cancer (HCC)    Cardiac murmur    Chest pain    Chronic fatigue syndrome 11/29/2020   Depression    Essential hypertension 11/29/2020   Hepatic steatosis determined  by biopsy of liver 07/04/2020   HLD  (hyperlipidemia)    Hypertension    Hypomagnesemia 03/26/2023   Hypothyroidism 11/29/2020   Insomnia 11/29/2020   Iron  deficiency anemia 06/05/2022   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   Migraine 11/29/2020   Mild intermittent asthma 11/29/2020   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   Mixed hyperlipidemia 11/29/2020   Obesity due to excess calories    Osteopenia after menopause 07/04/2020   Other long term (current) drug therapy 11/29/2020   Other vitamin B12 deficiency anemias 11/29/2020   Personal history of malignant neoplasm of breast 11/29/2020   Pre-diabetes    Renal cyst, acquired, right 07/04/2020   14.5 cm in October 2021   Retrocalcaneal bursitis (back of heel), left 04/22/2018   Seasonal allergic rhinitis 11/29/2020   Tightness of heel cord, left 04/22/2018   Tremor 08/02/2023   Type 2 diabetes mellitus without complications (HCC) 11/29/2020   Vitamin D  deficiency 11/29/2020    Past Surgical History:  Procedure Laterality Date   CESAREAN SECTION     COLONOSCOPY     IR BONE TUMOR(S)RF ABLATION  05/29/2022   IR BONE TUMOR(S)RF ABLATION  05/29/2022   IR KYPHO EA ADDL LEVEL THORACIC OR LUMBAR  05/29/2022   IR KYPHO THORACIC WITH BONE BIOPSY  05/29/2022   IR RADIOLOGIST EVAL & MGMT  05/22/2022   IR RADIOLOGIST EVAL & MGMT  06/12/2022   LIVER BIOPSY  01/2019   lumpectomy  2014   Left Breast   PORT-A-CATH REMOVAL     PORTACATH PLACEMENT  08/28/2013   STOMACH SURGERY     Tummy Tuck   TOTAL HIP ARTHROPLASTY Right 07/04/2021   Procedure: TOTAL HIP ARTHROPLASTY;  Surgeon: Ernie Cough, MD;  Location: [facility];  Service: Orthopedics;  Laterality: Right;  90   WISDOM TOOTH EXTRACTION      Family History  Problem Relation Age of Onset   Hyperlipidemia Mother    Diabetes Father    Stroke Father    Colon cancer Neg Hx    Colon polyps Neg Hx    Esophageal cancer Neg Hx    Rectal cancer Neg Hx    Stomach cancer Neg  Hx     Social History:  reports that she quit smoking about 21 years ago. Her smoking use included cigarettes. She has never used smokeless tobacco. She reports that she does not currently use alcohol. She reports that she does not use drugs.The patient is accompanied by her husband today.  Allergies:  Allergies  Allergen Reactions   Xgeva  [Denosumab ] Other (See Comments)    Possible ONJ (09/13/23)   Reglan  [Metoclopramide ] Other (See Comments)    Made patient feel really funny when taking this medication     Current Medications: Current Outpatient Medications  Medication Sig Dispense Refill   albuterol  (VENTOLIN  HFA) 108 (90 Base) MCG/ACT inhaler Inhale 2 puffs into the lungs every 6 (six) hours as needed for shortness of breath.     amoxicillin -clavulanate (AUGMENTIN ) 875-125 MG tablet Take 1 tablet by mouth 2 (two) times daily.     ARIPiprazole  (ABILIFY ) 5 MG tablet Take 2.5 mg by mouth daily.     aspirin 81 MG EC tablet Take 81 mg by mouth daily. Swallow whole.     atorvastatin  (LIPITOR) 20 MG tablet Take 1 tablet (20 mg total) by mouth daily. 90 tablet 3   augmented betamethasone dipropionate (DIPROLENE-AF) 0.05 % cream  (Patient taking differently: 1 Application as needed.)  calcitonin (MIACALCIN) 200 UNIT/ML injection Inject 1 mL (200 Units total) into the skin 2 (two) times daily. 14 mL 0   dapagliflozin  propanediol (FARXIGA ) 10 MG TABS tablet TAKE ONE TABLET BY MOUTH EVERY DAY 30 tablet 10   desvenlafaxine  (PRISTIQ ) 50 MG 24 hr tablet Take 50 mg by mouth daily.     dexamethasone  (DECADRON ) 4 MG tablet Take 1 tablet (4 mg total) by mouth daily. 30 tablet 3   diphenoxylate -atropine  (LOMOTIL ) 2.5-0.025 MG tablet TAKE TWO TABLETS BY MOUTH FOUR TIMES A DAY AS NEEDED FOR DIARRHEA OR LOOSE STOOLS 100 tablet 0   fentaNYL  (DURAGESIC ) 25 MCG/HR Place 1 patch onto the skin every 3 (three) days. 5 patch 0   fluconazole  (DIFLUCAN ) 150 MG tablet Take 1 tablet (150 mg total) by mouth daily.  5 tablet 3   folic acid  (FOLVITE ) 1 MG tablet TAKE ONE TABLET BY MOUTH DAILY 30 tablet 5   labetalol  (NORMODYNE ) 100 MG tablet Take 0.5 tablets (50 mg total) by mouth 2 (two) times daily. 90 tablet 3   loratadine  (CLARITIN ) 10 MG tablet TAKE ONE TABLET BY MOUTH EVERY DAY 30 tablet 5   LORazepam  (ATIVAN ) 1 MG tablet Take 1 tablet (1 mg total) by mouth every 6 (six) hours as needed for anxiety (nausea). 60 tablet 0   magic mouthwash (lidocaine , diphenhydrAMINE , alum & mag hydroxide) suspension Take 5 mLs by mouth every 3 (three) hours as needed. Swish and spit; for throat/mouth pain     naloxone  (NARCAN ) nasal spray 4 mg/0.1 mL One spray in nostril as needed, may repeat every 2 to 3 minutes until medical assistance becomes available 4 each 1   nitroGLYCERIN  (NITROSTAT ) 0.4 MG SL tablet Place 0.4 mg under the tongue every 5 (five) minutes as needed for chest pain.     nystatin  (MYCOSTATIN /NYSTOP ) powder Apply 1 Application topically 2 (two) times daily.     omeprazole  (PRILOSEC) 40 MG capsule Take 1 capsule (40 mg total) by mouth at bedtime. 30 capsule 5   ondansetron  (ZOFRAN ) 8 MG tablet Take 1 tablet (8 mg total) by mouth every 8 (eight) hours as needed for nausea or vomiting. 60 tablet 5   Oxycodone  HCl 10 MG TABS Take 1 tablet (10 mg total) by mouth every 4 (four) hours as needed. for pain 180 tablet 0   penicillin v potassium (VEETID) 500 MG tablet Take 500 mg by mouth 4 (four) times daily. (Patient taking differently: Take 500 mg by mouth 4 (four) times daily. Finished one round, unsure if she needs to continue or not at this time)     Potassium 99 MG TABS Take 2 tablets by mouth 2 (two) times daily. GUMMIES     potassium chloride  (KLOR-CON  M) 10 MEQ tablet Take 2 tablets (20 mEq total) by mouth 2 (two) times daily. 60 tablet 0   prochlorperazine  (COMPAZINE ) 10 MG tablet TAKE ONE TABLET BY MOUTH EVERY 6 HOURS AS NEEDED FOR NAUSEA/VOMITING 90 tablet 3   Vibegron (GEMTESA) 75 MG TABS Take 1 tablet  by mouth daily.     Current Facility-Administered Medications  Medication Dose Route Frequency Provider Last Rate Last Admin   technetium sestamibi generic (CARDIOLITE ) injection 10.5 millicurie  10.5 millicurie Intravenous Once PRN Revankar, Rajan R, MD       Facility-Administered Medications Ordered in Other Visits  Medication Dose Route Frequency Provider Last Rate Last Admin   0.9 %  sodium chloride  infusion   Intravenous Continuous Harl Anna LABOR, NP  heparin  lock flush 100 unit/mL  500 Units Intracatheter Once PRN Deziah Renwick A, NP       HYDROmorphone  (DILAUDID ) injection 1 mg  1 mg Intravenous Once Nissim Fleischer A, NP       magnesium  sulfate IVPB 2 g 50 mL  2 g Intravenous Once Araiyah Cumpton A, NP       potassium chloride  10 mEq in 100 mL IVPB  10 mEq Intravenous Q1 Hr x 3 Harl Setter A, NP       sodium chloride  flush (NS) 0.9 % injection 10 mL  10 mL Intracatheter PRN Mosher, Kelli A, PA-C   10 mL at 11/15/23 1111   sodium chloride  flush (NS) 0.9 % injection 10 mL  10 mL Intracatheter PRN Mosher, Kelli A, PA-C       sodium chloride  flush (NS) 0.9 % injection 10 mL  10 mL Intravenous PRN Cornelius Wanda DEL, MD   10 mL at 12/06/23 1109   sodium chloride  flush 0.9 % injection 10 mL  10 mL Intravenous PRN Cornelius Wanda DEL, MD   10 mL at 11/05/23 [phone]

## 2024-06-24 NOTE — Telephone Encounter (Signed)
 CRITICAL VALUE STICKER  CRITICAL VALUE: potassium 2.7  RECEIVER (on-site recipient of call):[facility] LPN  DATE & TIME NOTIFIED: 06/24/24 9095   MESSENGER (representative from lab): Su[facility] at [facility]   MD NOTIFIED: Eleanor Bach, FNP-BC  TIME OF NOTIFICATION: 0909  RESPONSE:  going to see patient in infusion

## 2024-06-24 NOTE — Progress Notes (Signed)
g

## 2024-06-24 NOTE — Patient Instructions (Signed)
 Potassium Chloride Injection What is this medication? POTASSIUM CHLORIDE (poe TASS i um KLOOR ide) prevents and treats low levels of potassium in your body. Potassium plays an important role in maintaining the health of your kidneys, heart, muscles, and nervous system. This medicine may be used for other purposes; ask your health care provider or pharmacist if you have questions. COMMON BRAND NAME(S): PROAMP What should I tell my care team before I take this medication? They need to know if you have any of these conditions: Addison disease Dehydration Diabetes (high blood sugar) Heart disease High levels of potassium in the blood Irregular heartbeat or rhythm Kidney disease Large areas of burned skin An unusual or allergic reaction to potassium, other medications, foods, dyes, or preservatives Pregnant or trying to get pregnant Breast-feeding How should I use this medication? This medication is injected into a vein. It is given in a hospital or clinic setting. Talk to your care team about the use of this medication in children. Special care may be needed. Overdosage: If you think you have taken too much of this medicine contact a poison control center or emergency room at once. NOTE: This medicine is only for you. Do not share this medicine with others. What if I miss a dose? This does not apply. This medication is not for regular use. What may interact with this medication? Do not take this medication with any of the following: Certain diuretics, such as spironolactone, triamterene Eplerenone Sodium polystyrene sulfonate This medication may also interact with the following: Certain medications for blood pressure or heart disease, such as lisinopril, losartan, quinapril, valsartan Medications that lower your chance of fighting infection, such as cyclosporine, tacrolimus NSAIDs, medications for pain and inflammation, such as ibuprofen or naproxen Other potassium supplements Salt  substitutes This list may not describe all possible interactions. Give your health care provider a list of all the medicines, herbs, non-prescription drugs, or dietary supplements you use. Also tell them if you smoke, drink alcohol, or use illegal drugs. Some items may interact with your medicine. What should I watch for while using this medication? Visit your care team for regular checks on your progress. Tell your care team if your symptoms do not start to get better or if they get worse. You may need blood work while you are taking this medication. Avoid salt substitutes unless you are told otherwise by your care team. What side effects may I notice from receiving this medication? Side effects that you should report to your care team as soon as possible: Allergic reactions--skin rash, itching, hives, swelling of the face, lips, tongue, or throat High potassium level--muscle weakness, fast or irregular heartbeat Side effects that usually do not require medical attention (report to your care team if they continue or are bothersome): Diarrhea Nausea Stomach pain Vomiting This list may not describe all possible side effects. Call your doctor for medical advice about side effects. You may report side effects to FDA at 1-800-FDA-1088. Where should I keep my medication? This medication is given in a hospital or clinic. It will not be stored at home. NOTE: This sheet is a summary. It may not cover all possible information. If you have questions about this medicine, talk to your doctor, pharmacist, or health care provider.  2024 Elsevier/Gold Standard (2022-02-09 00:00:00)

## 2024-06-24 NOTE — Progress Notes (Signed)
 Anna parson, np over to evaluate pt.  Mom states pt is not eating.  She has not had any protein in over a week.  Pt states she is not able to drink much in the way of fluids.    0914-dr mccarty in to discuss poc with pt.

## 2024-06-25 ENCOUNTER — Inpatient Hospital Stay

## 2024-06-26 ENCOUNTER — Inpatient Hospital Stay

## 2024-06-26 ENCOUNTER — Other Ambulatory Visit: Payer: Self-pay | Admitting: Hematology and Oncology

## 2024-06-26 DIAGNOSIS — C50412 Malignant neoplasm of upper-outer quadrant of left female breast: Secondary | ICD-10-CM | POA: Diagnosis not present

## 2024-06-26 DIAGNOSIS — C7951 Secondary malignant neoplasm of bone: Secondary | ICD-10-CM

## 2024-06-26 LAB — CBC WITH DIFFERENTIAL (CANCER CENTER ONLY)
Abs Immature Granulocytes: 0.06 K/uL (ref 0.00–0.07)
Basophils Absolute: 0 K/uL (ref 0.0–0.1)
Basophils Relative: 0 %
Eosinophils Absolute: 0 K/uL (ref 0.0–0.5)
Eosinophils Relative: 0 %
HCT: 31.9 % — ABNORMAL LOW (ref 36.0–46.0)
Hemoglobin: 10.2 g/dL — ABNORMAL LOW (ref 12.0–15.0)
Immature Granulocytes: 1 %
Lymphocytes Relative: 6 %
Lymphs Abs: 0.3 K/uL — ABNORMAL LOW (ref 0.7–4.0)
MCH: 25.7 pg — ABNORMAL LOW (ref 26.0–34.0)
MCHC: 32 g/dL (ref 30.0–36.0)
MCV: 80.4 fL (ref 80.0–100.0)
Monocytes Absolute: 0.5 K/uL (ref 0.1–1.0)
Monocytes Relative: 10 %
Neutro Abs: 3.9 K/uL (ref 1.7–7.7)
Neutrophils Relative %: 83 %
Platelet Count: 500 K/uL — ABNORMAL HIGH (ref 150–400)
RBC: 3.97 MIL/uL (ref 3.87–5.11)
RDW: 16.6 % — ABNORMAL HIGH (ref 11.5–15.5)
WBC Count: 4.7 K/uL (ref 4.0–10.5)
nRBC: 0 % (ref 0.0–0.2)

## 2024-06-26 LAB — CMP (CANCER CENTER ONLY)
ALT: 46 U/L — ABNORMAL HIGH (ref 0–44)
AST: 71 U/L — ABNORMAL HIGH (ref 15–41)
Albumin: 3.2 g/dL — ABNORMAL LOW (ref 3.5–5.0)
Alkaline Phosphatase: 243 U/L — ABNORMAL HIGH (ref 38–126)
Anion gap: 9 (ref 5–15)
BUN: 23 mg/dL — ABNORMAL HIGH (ref 6–20)
CO2: 34 mmol/L — ABNORMAL HIGH (ref 22–32)
Calcium: 13 mg/dL — ABNORMAL HIGH (ref 8.9–10.3)
Chloride: 91 mmol/L — ABNORMAL LOW (ref 98–111)
Creatinine: 0.55 mg/dL (ref 0.44–1.00)
GFR, Estimated: >60 mL/min (ref >60)
Glucose, Bld: 164 mg/dL — ABNORMAL HIGH (ref 70–99)
Potassium: 3 mmol/L — ABNORMAL LOW (ref 3.5–5.1)
Sodium: 134 mmol/L — ABNORMAL LOW (ref 135–145)
Total Bilirubin: 0.4 mg/dL (ref 0.0–1.2)
Total Protein: 6 g/dL — ABNORMAL LOW (ref 6.5–8.1)

## 2024-06-26 LAB — MAGNESIUM: Magnesium: 1.6 mg/dL — ABNORMAL LOW (ref 1.7–2.4)

## 2024-06-26 MED ORDER — FUROSEMIDE 10 MG/ML IJ SOLN
20.0000 mg | Freq: Once | INTRAMUSCULAR | Status: AC
  Administered 2024-06-26: 20 mg via INTRAVENOUS
  Filled 2024-06-26: qty 2

## 2024-06-26 MED ORDER — MAGNESIUM SULFATE 4 GM/100ML IV SOLN
4.0000 g | Freq: Once | INTRAVENOUS | Status: AC
  Administered 2024-06-26: 4 g via INTRAVENOUS
  Filled 2024-06-26: qty 100

## 2024-06-26 MED ORDER — POTASSIUM CHLORIDE 10 MEQ/100ML IV SOLN
10.0000 meq | INTRAVENOUS | Status: AC
  Administered 2024-06-26 (×3): 10 meq via INTRAVENOUS
  Filled 2024-06-26 (×3): qty 100

## 2024-06-26 MED ORDER — SODIUM CHLORIDE 0.9 % IV SOLN: INTRAVENOUS | Status: DC

## 2024-06-26 NOTE — Patient Instructions (Signed)
Potassium Chloride Injection What is this medication? POTASSIUM CHLORIDE (poe TASS i um KLOOR ide) prevents and treats low levels of potassium in your body. Potassium plays an important role in maintaining the health of your kidneys, heart, muscles, and nervous system. This medicine may be used for other purposes; ask your health care provider or pharmacist if you have questions. COMMON BRAND NAME(S): PROAMP What should I tell my care team before I take this medication? They need to know if you have any of these conditions: Addison disease Dehydration Diabetes (high blood sugar) Heart disease High levels of potassium in the blood Irregular heartbeat or rhythm Kidney disease Large areas of burned skin An unusual or allergic reaction to potassium, other medications, foods, dyes, or preservatives Pregnant or trying to get pregnant Breast-feeding How should I use this medication? This medication is injected into a vein. It is given in a hospital or clinic setting. Talk to your care team about the use of this medication in children. Special care may be needed. Overdosage: If you think you have taken too much of this medicine contact a poison control center or emergency room at once. NOTE: This medicine is only for you. Do not share this medicine with others. What if I miss a dose? This does not apply. This medication is not for regular use. What may interact with this medication? Do not take this medication with any of the following: Certain diuretics, such as spironolactone, triamterene Eplerenone Sodium polystyrene sulfonate This medication may also interact with the following: Certain medications for blood pressure or heart disease, such as lisinopril, losartan, quinapril, valsartan Medications that lower your chance of fighting infection, such as cyclosporine, tacrolimus NSAIDs, medications for pain and inflammation, such as ibuprofen or naproxen Other potassium supplements Salt  substitutes This list may not describe all possible interactions. Give your health care provider a list of all the medicines, herbs, non-prescription drugs, or dietary supplements you use. Also tell them if you smoke, drink alcohol, or use illegal drugs. Some items may interact with your medicine. What should I watch for while using this medication? Visit your care team for regular checks on your progress. Tell your care team if your symptoms do not start to get better or if they get worse. You may need blood work while you are taking this medication. Avoid salt substitutes unless you are told otherwise by your care team. What side effects may I notice from receiving this medication? Side effects that you should report to your care team as soon as possible: Allergic reactions--skin rash, itching, hives, swelling of the face, lips, tongue, or throat High potassium level--muscle weakness, fast or irregular heartbeat Side effects that usually do not require medical attention (report to your care team if they continue or are bothersome): Diarrhea Nausea Stomach pain Vomiting This list may not describe all possible side effects. Call your doctor for medical advice about side effects. You may report side effects to FDA at 1-800-FDA-1088. Where should I keep my medication? This medication is given in a hospital or clinic. It will not be stored at home. NOTE: This sheet is a summary. It may not cover all possible information. If you have questions about this medicine, talk to your doctor, pharmacist, or health care provider.  2024 Elsevier/Gold Standard (2022-02-09 00:00:00)  Magnesium Sulfate Injection What is this medication? MAGNESIUM SULFATE (mag NEE zee um SUL fate) prevents and treats low levels of magnesium in your body. It may also be used to prevent and treat  seizures during pregnancy in people with high blood pressure disorders, such as preeclampsia or eclampsia. Magnesium plays an  important role in maintaining the health of your muscles and nervous system. This medicine may be used for other purposes; ask your health care provider or pharmacist if you have questions. What should I tell my care team before I take this medication? They need to know if you have any of these conditions: Heart disease History of irregular heart beat Kidney disease An unusual or allergic reaction to magnesium sulfate, medications, foods, dyes, or preservatives Pregnant or trying to get pregnant Breast-feeding How should I use this medication? This medication is for infusion into a vein. It is given in a hospital or clinic setting. Talk to your care team about the use of this medication in children. While this medication may be prescribed for selected conditions, precautions do apply. Overdosage: If you think you have taken too much of this medicine contact a poison control center or emergency room at once. NOTE: This medicine is only for you. Do not share this medicine with others. What if I miss a dose? This does not apply. What may interact with this medication? Certain medications for anxiety or sleep Certain medications for seizures, such phenobarbital Digoxin Medications that relax muscles for surgery Narcotic medications for pain This list may not describe all possible interactions. Give your health care provider a list of all the medicines, herbs, non-prescription drugs, or dietary supplements you use. Also tell them if you smoke, drink alcohol, or use illegal drugs. Some items may interact with your medicine. What should I watch for while using this medication? Your condition will be monitored carefully while you are receiving this medication. You may need blood work done while you are receiving this medication. What side effects may I notice from receiving this medication? Side effects that you should report to your care team as soon as possible: Allergic reactions--skin rash,  itching, hives, swelling of the face, lips, tongue, or throat High magnesium level--confusion, drowsiness, facial flushing, redness, sweating, muscle weakness, fast or irregular heartbeat, trouble breathing Low blood pressure--dizziness, feeling faint or lightheaded, blurry vision Side effects that usually do not require medical attention (report to your care team if they continue or are bothersome): Headache Nausea This list may not describe all possible side effects. Call your doctor for medical advice about side effects. You may report side effects to FDA at 1-800-FDA-1088. Where should I keep my medication? This medication is given in a hospital or clinic and will not be stored at home. NOTE: This sheet is a summary. It may not cover all possible information. If you have questions about this medicine, talk to your doctor, pharmacist, or health care provider.  2024 Elsevier/Gold Standard (2021-04-12 00:00:00)

## 2024-06-29 ENCOUNTER — Inpatient Hospital Stay

## 2024-06-29 ENCOUNTER — Other Ambulatory Visit: Payer: Self-pay | Admitting: Pharmacist

## 2024-06-29 VITALS — BP 96/65 | HR 110 | Temp 98.2°F | Resp 18

## 2024-06-29 DIAGNOSIS — C50412 Malignant neoplasm of upper-outer quadrant of left female breast: Secondary | ICD-10-CM | POA: Diagnosis not present

## 2024-06-29 DIAGNOSIS — C7951 Secondary malignant neoplasm of bone: Secondary | ICD-10-CM

## 2024-06-29 LAB — CBC WITH DIFFERENTIAL (CANCER CENTER ONLY)
Abs Immature Granulocytes: 0.2 K/uL — ABNORMAL HIGH (ref 0.00–0.07)
Basophils Absolute: 0 K/uL (ref 0.0–0.1)
Basophils Relative: 0 %
Eosinophils Absolute: 0 K/uL (ref 0.0–0.5)
Eosinophils Relative: 0 %
HCT: 30.6 % — ABNORMAL LOW (ref 36.0–46.0)
Hemoglobin: 10 g/dL — ABNORMAL LOW (ref 12.0–15.0)
Immature Granulocytes: 2 %
Lymphocytes Relative: 5 %
Lymphs Abs: 0.6 K/uL — ABNORMAL LOW (ref 0.7–4.0)
MCH: 25.6 pg — ABNORMAL LOW (ref 26.0–34.0)
MCHC: 32.7 g/dL (ref 30.0–36.0)
MCV: 78.3 fL — ABNORMAL LOW (ref 80.0–100.0)
Monocytes Absolute: 0.5 K/uL (ref 0.1–1.0)
Monocytes Relative: 5 %
Neutro Abs: 9.5 K/uL — ABNORMAL HIGH (ref 1.7–7.7)
Neutrophils Relative %: 88 %
Platelet Count: 380 K/uL (ref 150–400)
RBC: 3.91 MIL/uL (ref 3.87–5.11)
RDW: 17 % — ABNORMAL HIGH (ref 11.5–15.5)
Smear Review: NORMAL
WBC Count: 10.8 K/uL — ABNORMAL HIGH (ref 4.0–10.5)
nRBC: 0.5 % — ABNORMAL HIGH (ref 0.0–0.2)

## 2024-06-29 LAB — CMP (CANCER CENTER ONLY)
ALT: 33 U/L (ref 0–44)
AST: 35 U/L (ref 15–41)
Albumin: 3 g/dL — ABNORMAL LOW (ref 3.5–5.0)
Alkaline Phosphatase: 211 U/L — ABNORMAL HIGH (ref 38–126)
Anion gap: 11 (ref 5–15)
BUN: 22 mg/dL — ABNORMAL HIGH (ref 6–20)
CO2: 32 mmol/L (ref 22–32)
Calcium: 12.6 mg/dL — ABNORMAL HIGH (ref 8.9–10.3)
Chloride: 89 mmol/L — ABNORMAL LOW (ref 98–111)
Creatinine: 0.61 mg/dL (ref 0.44–1.00)
GFR, Estimated: >60 mL/min (ref >60)
Glucose, Bld: 168 mg/dL — ABNORMAL HIGH (ref 70–99)
Potassium: 3.2 mmol/L — ABNORMAL LOW (ref 3.5–5.1)
Sodium: 132 mmol/L — ABNORMAL LOW (ref 135–145)
Total Bilirubin: 0.5 mg/dL (ref 0.0–1.2)
Total Protein: 5.5 g/dL — ABNORMAL LOW (ref 6.5–8.1)

## 2024-06-29 LAB — MAGNESIUM: Magnesium: 1.6 mg/dL — ABNORMAL LOW (ref 1.7–2.4)

## 2024-06-29 MED ORDER — SODIUM CHLORIDE 0.9 % IV SOLN: Freq: Once | INTRAVENOUS | Status: AC

## 2024-06-29 MED ORDER — MAGNESIUM SULFATE 2 GM/50ML IV SOLN
2.0000 g | Freq: Once | INTRAVENOUS | Status: AC
  Administered 2024-06-29: 2 g via INTRAVENOUS
  Filled 2024-06-29: qty 50

## 2024-06-29 MED ORDER — FUROSEMIDE 10 MG/ML IJ SOLN
20.0000 mg | Freq: Once | INTRAMUSCULAR | Status: AC
  Administered 2024-06-29: 20 mg via INTRAVENOUS
  Filled 2024-06-29: qty 2

## 2024-06-29 MED ORDER — POTASSIUM CHLORIDE 10 MEQ/100ML IV SOLN
10.0000 meq | INTRAVENOUS | Status: AC
  Administered 2024-06-29 (×3): 10 meq via INTRAVENOUS
  Filled 2024-06-29 (×3): qty 100

## 2024-07-01 ENCOUNTER — Ambulatory Visit (INDEPENDENT_AMBULATORY_CARE_PROVIDER_SITE_OTHER)
Admission: RE | Admit: 2024-07-01 | Discharge: 2024-07-01 | Disposition: A | Discharge status: Home / Self Care | Source: Ambulatory Visit | Attending: Hematology and Oncology | Admitting: Hematology and Oncology

## 2024-07-01 ENCOUNTER — Inpatient Hospital Stay

## 2024-07-01 ENCOUNTER — Other Ambulatory Visit: Payer: Self-pay | Admitting: Hematology and Oncology

## 2024-07-01 VITALS — BP 97/52 | HR 122 | Temp 97.9°F | Resp 18

## 2024-07-01 DIAGNOSIS — C7951 Secondary malignant neoplasm of bone: Secondary | ICD-10-CM

## 2024-07-01 DIAGNOSIS — C50412 Malignant neoplasm of upper-outer quadrant of left female breast: Secondary | ICD-10-CM | POA: Diagnosis not present

## 2024-07-01 LAB — CBC WITH DIFFERENTIAL (CANCER CENTER ONLY)
Abs Immature Granulocytes: 0.16 K/uL — ABNORMAL HIGH (ref 0.00–0.07)
Basophils Absolute: 0 K/uL (ref 0.0–0.1)
Basophils Relative: 0 %
Eosinophils Absolute: 0.1 K/uL (ref 0.0–0.5)
Eosinophils Relative: 1 %
HCT: 27 % — ABNORMAL LOW (ref 36.0–46.0)
Hemoglobin: 8.7 g/dL — ABNORMAL LOW (ref 12.0–15.0)
Immature Granulocytes: 2 %
Lymphocytes Relative: 5 %
Lymphs Abs: 0.4 K/uL — ABNORMAL LOW (ref 0.7–4.0)
MCH: 26 pg (ref 26.0–34.0)
MCHC: 32.2 g/dL (ref 30.0–36.0)
MCV: 80.6 fL (ref 80.0–100.0)
Monocytes Absolute: 0.4 K/uL (ref 0.1–1.0)
Monocytes Relative: 5 %
Neutro Abs: 7 K/uL (ref 1.7–7.7)
Neutrophils Relative %: 87 %
Platelet Count: 356 K/uL (ref 150–400)
RBC: 3.35 MIL/uL — ABNORMAL LOW (ref 3.87–5.11)
RDW: 17.6 % — ABNORMAL HIGH (ref 11.5–15.5)
WBC Count: 8 K/uL (ref 4.0–10.5)
nRBC: 0.2 % (ref 0.0–0.2)

## 2024-07-01 LAB — CMP (CANCER CENTER ONLY)
ALT: 25 U/L (ref 0–44)
AST: 33 U/L (ref 15–41)
Albumin: 2.5 g/dL — ABNORMAL LOW (ref 3.5–5.0)
Alkaline Phosphatase: 188 U/L — ABNORMAL HIGH (ref 38–126)
Anion gap: 9 (ref 5–15)
BUN: 13 mg/dL (ref 6–20)
CO2: 32 mmol/L (ref 22–32)
Calcium: 12.5 mg/dL — ABNORMAL HIGH (ref 8.9–10.3)
Chloride: 92 mmol/L — ABNORMAL LOW (ref 98–111)
Creatinine: 0.49 mg/dL (ref 0.44–1.00)
GFR, Estimated: >60 mL/min (ref >60)
Glucose, Bld: 145 mg/dL — ABNORMAL HIGH (ref 70–99)
Potassium: 2.6 mmol/L — CL (ref 3.5–5.1)
Sodium: 133 mmol/L — ABNORMAL LOW (ref 135–145)
Total Bilirubin: 0.4 mg/dL (ref 0.0–1.2)
Total Protein: 4.7 g/dL — ABNORMAL LOW (ref 6.5–8.1)

## 2024-07-01 LAB — MAGNESIUM: Magnesium: 1.5 mg/dL — ABNORMAL LOW (ref 1.7–2.4)

## 2024-07-01 MED ORDER — IOHEXOL 300 MG/ML  SOLN
100.0000 mL | Freq: Once | INTRAMUSCULAR | Status: AC | PRN
  Administered 2024-07-01: 100 mL via INTRAVENOUS

## 2024-07-01 MED ORDER — SODIUM CHLORIDE 0.9 % IV SOLN: INTRAVENOUS | Status: DC

## 2024-07-01 MED ORDER — HYDROMORPHONE HCL 1 MG/ML IJ SOLN: 1.0000 mg | Freq: Every day | INTRAMUSCULAR | Status: DC | PRN

## 2024-07-01 MED ORDER — POTASSIUM CHLORIDE 10 MEQ/100ML IV SOLN
10.0000 meq | INTRAVENOUS | Status: AC
  Administered 2024-07-01 (×3): 10 meq via INTRAVENOUS
  Filled 2024-07-01 (×3): qty 100

## 2024-07-01 MED ORDER — SODIUM CHLORIDE 0.9 % IV SOLN: Freq: Once | INTRAVENOUS | Status: AC

## 2024-07-01 MED ORDER — MAGNESIUM SULFATE 2 GM/50ML IV SOLN
2.0000 g | Freq: Once | INTRAVENOUS | Status: AC
  Administered 2024-07-01: 2 g via INTRAVENOUS
  Filled 2024-07-01: qty 50

## 2024-07-01 MED ORDER — FUROSEMIDE 10 MG/ML IJ SOLN
20.0000 mg | Freq: Once | INTRAMUSCULAR | Status: AC
  Administered 2024-07-01: 20 mg via INTRAVENOUS
  Filled 2024-07-01: qty 2

## 2024-07-01 MED ORDER — HYDROMORPHONE HCL 1 MG/ML IJ SOLN
1.0000 mg | Freq: Once | INTRAMUSCULAR | Status: AC
  Administered 2024-07-01: 1 mg via INTRAVENOUS
  Filled 2024-07-01: qty 1

## 2024-07-01 NOTE — Progress Notes (Signed)
 Patient declined assistance with bathroom several time throughout the day.

## 2024-07-01 NOTE — Progress Notes (Signed)
 CRITICAL VALUE STICKER  CRITICAL VALUE:  K+ 2.6  RECEIVER (on-site recipient of call):  Woodie Kapur RN  DATE & TIME NOTIFIED:   07/01/2024 @ 1034  MESSENGER (representative from lab):  [facility]  MD NOTIFIED:   Eleanor Mouse NP  TIME OF NOTIFICATION:  1040  RESPONSE:   IV K+ today.

## 2024-07-01 NOTE — Patient Instructions (Signed)
Hypomagnesemia Hypomagnesemia is a condition in which the level of magnesium in the blood is too low. Magnesium is a mineral that is found in many foods. It is used in many different processes in the body. Hypomagnesemia can affect every organ in the body. In severe cases, it can cause life-threatening problems. What are the causes? This condition may be caused by: Not getting enough magnesium in your diet or not having enough healthy foods to eat (malnutrition). Problems with magnesium absorption in the intestines. Dehydration. Excessive use of alcohol. Vomiting. Severe or long-term (chronic) diarrhea. Some medicines, including medicines that make you urinate more often (diuretics). Certain diseases, such as kidney disease, diabetes, celiac disease, and overactive thyroid. What are the signs or symptoms? Symptoms of this condition include: Loss of appetite, nausea, and vomiting. Involuntary shaking or trembling of a body part (tremor). Muscle weakness or tingling in the arms and legs. Sudden tightening of muscles (muscle spasms). Confusion. Psychiatric issues, such as: Depression and irritability. Psychosis. A feeling of fluttering of the heart (palpitations). Seizures. These symptoms are more severe if magnesium levels drop suddenly. How is this diagnosed? This condition may be diagnosed based on: Your symptoms and medical history. A physical exam. Blood and urine tests. How is this treated? Treatment depends on the cause and the severity of the condition. It may be treated by: Taking a magnesium supplement. This can be taken in pill form. If the condition is severe, magnesium is usually given through an IV. Making changes to your diet. You may be directed to eat foods that have a lot of magnesium, such as green leafy vegetables, peas, beans, and nuts. Not drinking alcohol. If you are struggling not to drink, ask your health care provider for help. Follow these instructions at  home: Eating and drinking     Make sure that your diet includes foods with magnesium. Foods that have a lot of magnesium in them include: Green leafy vegetables, such as spinach and broccoli. Beans and peas. Nuts and seeds, such as almonds and sunflower seeds. Whole grains, such as whole grain bread and fortified cereals. Drink fluids that contain salts and minerals (electrolytes), such as sports drinks, when you are active. Do not drink alcohol. General instructions Take over-the-counter and prescription medicines only as told by your health care provider. Take magnesium supplements as directed if your health care provider tells you to take them. Have your magnesium levels monitored as told by your health care provider. Keep all follow-up visits. This is important. Contact a health care provider if: You get worse instead of better. Your symptoms return. Get help right away if: You develop severe muscle weakness. You have trouble breathing. You feel that your heart is racing. These symptoms may represent a serious problem that is an emergency. Do not wait to see if the symptoms will go away. Get medical help right away. Call your local emergency services (911 in the [place] Do not drive yourself to the hospital. Summary Hypomagnesemia is a condition in which the level of magnesium in the blood is too low. Hypomagnesemia can affect every organ in the body. Treatment may include eating more foods that contain magnesium, taking magnesium supplements, and not drinking alcohol. Have your magnesium levels monitored as told by your health care provider. This information is not intended to replace advice given to you by your health care provider. Make sure you discuss any questions you have with your health care provider. Document Revised: 12/27/2020 Document Reviewed: 12/27/2020 Elsevier Patient Education    2024 [employer]  Hypokalemia Hypokalemia means that the amount of potassium in  the blood is lower than normal. Potassium is a mineral (electrolyte) that helps regulate the amount of fluid in the body. It also stimulates muscle tightening (contraction) and helps nerves work properly. Normally, most of the body's potassium is inside cells, and only a very small amount is in the blood. Because the amount in the blood is so small, minor changes to potassium levels in the blood can be life-threatening. What are the causes? This condition may be caused by: Antibiotic medicine. Diarrhea or vomiting. Taking too much of a medicine that helps you have a bowel movement (laxative) can cause diarrhea and lead to hypokalemia. Chronic kidney disease (CKD). Medicines that help the body get rid of excess fluid (diuretics). Eating disorders, such as anorexia or bulimia. Low magnesium levels in the body. Sweating a lot. What are the signs or symptoms? Symptoms of this condition include: Weakness. Constipation. Fatigue. Muscle cramps. Mental confusion. Skipped heartbeats or irregular heartbeat (palpitations). Tingling or numbness. How is this diagnosed? This condition is diagnosed with a blood test. How is this treated? This condition may be treated by: Taking potassium supplements. Adjusting the medicines that you take. Eating more foods that contain a lot of potassium. If your potassium level is very low, you may need to get potassium through an IV and be monitored in the hospital. Follow these instructions at home: Eating and drinking  Eat a healthy diet. A healthy diet includes fresh fruits and vegetables, whole grains, healthy fats, and lean proteins. If told, eat more foods that contain a lot of potassium. These include: Nuts, such as peanuts and pistachios. Seeds, such as sunflower seeds and pumpkin seeds. Peas, lentils, and lima beans. Whole grain and bran cereals and breads. Fresh fruits and vegetables, such as apricots, avocado, bananas, cantaloupe, kiwi, oranges,  tomatoes, asparagus, and potatoes. Juices, such as orange, tomato, and prune. Lean meats, including fish. Milk and milk products, such as yogurt. General instructions Take over-the-counter and prescription medicines only as told by your health care provider. This includes vitamins, natural food products, and supplements. Keep all follow-up visits. This is important. Contact a health care provider if: You have weakness that gets worse. You feel your heart pounding or racing. You vomit. You have diarrhea. You have diabetes and you have trouble keeping your blood sugar in your target range. Get help right away if: You have chest pain. You have shortness of breath. You have vomiting or diarrhea that lasts for more than 2 days. You faint. These symptoms may be an emergency. Get help right away. Call 911. Do not wait to see if the symptoms will go away. Do not drive yourself to the hospital. Summary Hypokalemia means that the amount of potassium in the blood is lower than normal. This condition is diagnosed with a blood test. Hypokalemia may be treated by taking potassium supplements, adjusting the medicines that you take, or eating more foods that are high in potassium. If your potassium level is very low, you may need to get potassium through an IV and be monitored in the hospital. This information is not intended to replace advice given to you by your health care provider. Make sure you discuss any questions you have with your health care provider. Document Revised: 04/13/2021 Document Reviewed: 04/13/2021 Elsevier Patient Education  2024 [employer]  

## 2024-07-02 NOTE — Progress Notes (Signed)
 [facility]  [address] [place],  [place]  [address] [phone]  Clinic Day: 07/03/2024  Referring physician: Pia Kerney SQUIBB, MD  ASSESSMENT & PLAN:  Assessment: Malignant neoplasm metastatic to bone [facility]) Bone metastases diagnosed in November 2022, with multiple marrow replacing lesions within the proximal right femur within the ischium as well as the inferior rami. Anna [name] underwent total right hip replacement in late November. HER2 was also positive at 3+, which was negative on Anna [name] original cancer. Ki67 was 60%. PET imaging in December revealed dominant finding of intensely hypermetabolic aggressive skeletal metastasis involving the calvarium, sternum, thoracic and lumbar spine, and pelvis, with potential pathologic fracture of the right femur with internal fixation. There was soft tissue extension into the anterior mediastinum associated with the manubrial metastasis. Multiple spinal vertebral body lesions had cortical destruction along the posterior margin. Anna [name] started monthly Xgeva  in December, and completed radiation therapy. Anna [name] continued with Herceptin /Perjeta  until Anna [name] had progression of disease. The patient has been on various therapies over the last 3 years, but now is terminal with severe hypercalcemia which has been refractory to treatment. We are unable to administer bisphosphonates due to Anna [name] osteonecrosis of the jaw.   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  Anna [name] was treated with lumpectomy followed by adjuvant chemotherapy and radiation therapy. Anna [name] was on adjuvant hormonal therapy with tamoxifen 20 mg daily from October 2015 to May 2020, but due to elevation of the liver transaminases was switched to anastrazole.  Anna [name] then developed bone metastasis in November 2022 so has been receiving chemotherapy. Anna [name] had skin thickening on Anna [name] last mammogram,  but a nodule in the left breast was stable on CT imaging. The skin thickening is felt to possibly due to radiation, as Anna [name] exam remained stable. Continued mammograms are not recommended.   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 Anna [name] developed pain of the right jaw in February 2025.  Panorex x-rays revealed an abnormal area in the right lower jaw consistent with osteonecrosis.  Xgeva  was discontinued.  Anna [name] was treated with Augmentin  in February.  Anna [name] has been working closely with Anna [name] dentist. Anna [name] has had 2 fillings done, but also may need root canals.  Anna [name] sees him again on April 8.  Anna [name] had swelling and erythema of the left jaw earlier this month, so was placed on amoxicillin  4 times daily, with resolution of the swelling and erythema.  The redness and swelling have resolved, but Anna [name] states Anna [name] still has some mild tenderness of the anterior left jaw.  Anna [name] has had recurrent swelling and erythema and requires intermittent courses of Amoxicillin . We will treat Anna [name] again now with Augmentin . Anna [name] has been seen by the oral surgeon, Dr. Lonni Sax, and had a CT scan of the jaw, but I do not have that information. He wanted to know more details about Anna [name] treatment. Anna [name] was on denosumab  for two years and Anna [name] last dose was in January of 2025. Anna [name] bone cancer is metastatic from the breast and we have stopped the Enhertu  due to progression.    Iron  deficiency anemia New iron  deficiency anemia diagnosed in March of 2025, when Anna [name] presented with acute worsening of Anna [name] anemia. Anna [name] required transfusion of 3 units of PRBC's. Anna [name] then received IV iron  in the form of Venofer  for 5 doses. Anna [name] is taking oral iron   now.   Anna [name] hemoglobin has slightly improved, which may be due to hemoconcentration.  Anna [name] was referred to Dr. Charlanne to consider evaluation of the iron  deficiency. He will hold off on a colonoscopy. He performed an EGD 03/13/2024 which revealed both gastric and duodenal ulcers as  well as esophageal stricture. He did recommend increasing the omeprazole  to 40 mg BID and added Carafate .  Hypercalcemia This was first seen last month, but improved after Anna [name] cut down on calcium  in Anna [name] diet. Now it is extremely high and we want to avoid bisphosphonates and denosumab  due to Anna [name] ONJ. Anna [name] has had steadily progressive bone metastases and weakness. We have been unable to control Anna [name] hypercalcemia despite IV fluids, steroids, calcitonin injections, and Lasix . Anna [name] calcium  has jumped up to 14.6 [name] despite all these measures. The patient is ready to transition to comfort care with hospice.  Hypokalemia This is quite severe and Anna [name] is unable to tolerate oral potassium. The over the counter gummies are not effective, so I will try Anna [name] on the 10 meq potassium to take 2 TID, as tolerated. We have been giving Anna [name] IV potassium weekly due to Anna [name] inability to tolerate the oral supplement, and we are still unable to maintain any normal potassium.  We will stop the IV fluids and supplements.  Pain management Anna [name] is currently using 25 mcg/hr fentanyl  patches every 72 hours and 10 mg oxycodone  every 4 hours PRN. I instructed Anna [name] to use 2 fentanyl  patches for now and I will prescribe 50 mcg/hr fentanyl  patches to change every 72 hours.    Plan: Anna [name] has had steadily progressive bone metastases and weakness. We have been unable to control Anna [name] hypercalcemia despite IV fluids, steroids, calcitonin injections, and Lasix . Anna [name] calcium  has jumped up to 14.6 [name] and Anna [name] is lethargic and ready to stop aggressive treatment. Anna [name] was seen in the infusion room and given a dose of IV Dilaudid  for Anna [name] pain. Patient states that Anna [name] feels poorly and complains of pressure wound pain on buttocks rated 8/10, dysphagia, worsening fatigue, and increased extremity weakness. Anna [name] is currently using 25 mcg/hr fentanyl  patches every 72 hours and 10 mg oxycodone  every 4 hours PRN. I instructed Anna [name] to use 2 fentanyl  patches for now  and I will prescribe 50 mcg/hr fentanyl  patches to change every 72 hours. We are unable to administer bisphosphonates due to Anna [name] osteonecrosis of the jaw.  Anna [name] had a CT chest,abdomen, and pelvis on 07/01/2024 which revealed interval enlarged 1.1 cm lymph node over the upper left subpectoral region just below the level of the clavicle suspicious for metastatic lymph node, stable extensive bony metastatic disease throughout the axial and appendicular skeleton with multiple stable compression fractures throughout the spine with interval worsening lytic lesions over the pelvis with the largest over the right iliac bone adjacent the sacroiliac joint measuring 2.9 x 5.3 cm, stable 1 cm mass over the outer posterior left breast compatible with known lumpectomy site in this patient with history of breast cancer diagnosed 2014, stable significant elevation of the left hemidiaphragm causing volume loss of the left lung, stable 12 cm simple lower pole right renal cyst, and aortic atherosclerosis with atherosclerotic coronary artery disease. Anna [name] denies fever, chills, night sweats, or other signs of infection. Anna [name] denies cardiorespiratory and gastrointestinal issues. Anna [name] appetite is poor and Anna [name] weight has decreased 10 pounds over last  3 weeks. Anna [name] is accompanied by Anna [name] husband and mother.  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 We discussed the fact that Anna [name] is not likely to tolerate even targeted oral therapy at this time. Anna [name] has an elevated WBC of 11.6 with an ANC of 9800 up from 8.0 with an ANC of 7000, a low hemoglobin of 9.6 improved from 8.7, and an elevated platelet count of 434,000 up from 356,000.  Anna [name] CMP reveals a low potassium of 3.0 improved from 2.6, a cortically elevated calcium  of 14.6 up from 12.5, a low total protein of 5.7 up from 4.7, an elevated alkaline phosphatase of 226 up from 188. Anna [name] magnesium  is low and stable  at 1.5. I encouraged Anna [name] to increase fluid and protein intake and restrict calcium  In Anna [name] diet.  I introduced the option of transitioning to hospice care and Anna [name] is in agreement with this. Anna [name] husband and mother were present for this discussion and I described the services. I will set this up for Anna [name] and initiate DNR orders. I will cancel Anna [name] current appointments and see Anna [name] back as needed and mainly work through [employer] [facility] [place]. They know to call if they would like an appointment. I do feel Anna [name] has less than 6 months to live with Anna [name] progressive bone metastases and the hypercalcemia will likely cause Anna [name] to drift into a coma. The patient and Anna [name] husband understand the plans discussed [name] and are in agreement with them.  Anna [name] knows to contact our office if Anna [name] develops concerns prior to Anna [name] next appointment.  I provided 20 minutes of face-to-face time during this encounter and > 50% was spent counseling as documented under my assessment and plan.   Wanda VEAR Cornish, MD  [facility] A DEPT OF [facility] [address] [place] [address] Dept: [phone] Dept Fax: [phone]   No orders of the defined types were placed in this encounter.   CHIEF COMPLAINT:  CC: Metastatic hormone and HER2 receptor positive breast cancer  Current Treatment: Hospice care  HISTORY OF PRESENT ILLNESS:   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 (HCC)   07/23/2013 Cancer Staging   Staging form: Breast, AJCC 7th Edition - Clinical stage from 07/23/2013: Stage IIB (T2, N1, M0) - Signed by Cornish Wanda VEAR, MD on 07/04/2020 Prognostic indicators: Pos LVI   09/22/2021 - 04/06/2022 Chemotherapy   Patient is on Treatment Plan : BREAST  Trastuzumab  + Pertuzumab  q21d      09/22/2021 - 09/21/2022  Chemotherapy   Patient is on Treatment Plan : BREAST Trastuzumab  + Pertuzumab  q21d     10/12/2022 - 06/12/2024 Chemotherapy   Patient is on Treatment Plan : BREAST METASTATIC Fam-Trastuzumab Deruxtecan-nxki  (Enhertu ) (5.4) q28d (changed from q21d on 12/25/23)     Malignant neoplasm metastatic to bone (HCC)  06/28/2021 Initial Diagnosis   Bone metastases (HCC)   09/22/2021 - 04/06/2022 Chemotherapy   Patient is on Treatment Plan : BREAST  Trastuzumab  + Pertuzumab  q21d      09/22/2021 - 09/21/2022 Chemotherapy   Patient is on Treatment Plan : BREAST Trastuzumab  + Pertuzumab  q21d     05/11/2022 Imaging   CT chest, abdomen and pelvis:  IMPRESSION:  1. Widespread bony metastatic disease shows increased sclerosis  since previous imaging. This is likely related to response to  therapy in the interval. There is a single lesion along the  LEFT  iliac which shows continued lytic change and warrants attention on  follow-up  2. Interval development of pathologic fractures at in the upper  thoracic spine and at the thoracolumbar junction with associated  kyphosis.  3. No signs of solid organ or nodal metastatic disease.  4. Marked elevation of the LEFT hemidiaphragm as on the prior PET.  5. Aortic atherosclerosis.    10/12/2022 - 06/12/2024 Chemotherapy   Patient is on Treatment Plan : BREAST METASTATIC Fam-Trastuzumab Deruxtecan-nxki  (Enhertu ) (5.4) q28d (changed from q21d on 12/25/23)       INTERVAL HISTORY:  Anna [name] for repeat clinical assessment for Anna [name] metastatic hormone and HER2 receptor positive breast cancer. Anna [name] has had steadily progressive bone metastases and weakness. We have been unable to control Anna [name] hypercalcemia despite IV fluids, steroids, calcitonin injections, and Lasix . Anna [name] calcium  has jumped up to 14.6 [name] and Anna [name] is lethargic and ready to stop aggressive treatment. Anna [name] was seen in the infusion room and given a dose of IV Dilaudid  for Anna [name] pain. Patient states that Anna [name]  feels poorly and complains of pressure wound pain on buttocks rated 8/10, dysphagia, worsening fatigue, and increased extremity weakness. Anna [name] is currently using 25 mcg/hr fentanyl  patches every 72 hours and 10 mg oxycodone  every 4 hours PRN. I instructed Anna [name] to use 2 fentanyl  patches for now and I will prescribe 50 mcg/hr fentanyl  patches to change every 72 hours. We are unable to administer bisphosphonates due to Anna [name] osteonecrosis of the jaw.  Anna [name] had a CT chest,abdomen, and pelvis on 07/01/2024 which revealed interval enlarged 1.1 cm lymph node over the upper left subpectoral region just below the level of the clavicle suspicious for metastatic lymph node, stable extensive bony metastatic disease throughout the axial and appendicular skeleton with multiple stable compression fractures throughout the spine with interval worsening lytic lesions over the pelvis with the largest over the right iliac bone adjacent the sacroiliac joint measuring 2.9 x 5.3 cm, stable 1 cm mass over the outer posterior left breast compatible with known lumpectomy site in this patient with history of breast cancer diagnosed 2014, stable significant elevation of the left hemidiaphragm causing volume loss of the left lung, stable 12 cm simple lower pole right renal cyst, and aortic atherosclerosis with atherosclerotic coronary artery disease. Anna [name] denies fever, chills, night sweats, or other signs of infection. Anna [name] denies cardiorespiratory and gastrointestinal issues. Anna [name] appetite is poor and Anna [name] weight has decreased 10 pounds over last  3 weeks. Anna [name] is accompanied by Anna [name] husband and mother.  Anna [name] had [employer] testing performed on 06/30/2024 which revealed mutations of PIK3CA H1047R, ATM H2872L, and TP53 [id number]*17. We discussed the fact that Anna [name] is not likely to tolerate even targeted oral therapy at this time. Anna [name] has an elevated WBC of 11.6 with an ANC of 9800 up from 8.0 with an ANC of 7000, a low hemoglobin of 9.6 improved from 8.7,  and an elevated platelet count of 434,000 up from 356,000.  Anna [name] CMP reveals a low potassium of 3.0 improved from 2.6, a cortically elevated calcium  of 14.6 up from 12.5, a low total protein of 5.7 up from 4.7, an elevated alkaline phosphatase of 226 up from 188. Anna [name] magnesium  is low and stable at 1.5. I encouraged Anna [name] to increase fluid and protein intake and restrict calcium  In Anna [name] diet.  I introduced the option of transitioning to hospice care and Anna [name] is in agreement with this. Anna [name] husband and mother were present for this discussion and  I described the services. I will set this up for Anna [name] and initiate DNR orders. I will cancel Anna [name] current appointments and see Anna [name] back as needed and mainly work through [employer] of [place]. They know to call if they would like an appointment. I do feel Anna [name] has less than 6 months to live with Anna [name] progressive bone metastases and the hypercalcemia will likely cause Anna [name] to drift into a coma soon.  REVIEW OF SYSTEMS:   Review of Systems  Constitutional:  Positive for appetite change (poor) and fatigue (extreme somnolence). Negative for chills, fever and unexpected weight change.  HENT:   Positive for trouble swallowing. Negative for lump/mass, mouth sores and sore throat.        Mild right jaw swelling likely related to Anna [name] ONJ  Eyes:  Positive for eye problems (epiphora).  Respiratory:  Negative for cough and shortness of breath.   Cardiovascular:  Negative for chest pain and leg swelling.  Gastrointestinal:  Positive for constipation and nausea (some). Negative for abdominal pain, diarrhea and vomiting.  Genitourinary:  Negative for bladder incontinence, difficulty urinating, dysuria, frequency and hematuria.   Musculoskeletal:  Positive for arthralgias (right hip 6/10), back pain (Lower back 6/10), flank pain, gait problem (unable to walk) and neck pain. Negative for myalgias.  Skin:  Negative for rash and wound.  Neurological:  Positive for extremity weakness and gait  problem (unable to walk). Negative for dizziness, headaches, light-headedness and numbness.  Hematological:  Negative for adenopathy. Does not bruise/bleed easily.  Psychiatric/Behavioral:  Positive for confusion and decreased concentration (brain fog). Negative for depression and sleep disturbance. The patient is not nervous/anxious.     VITALS:   Blood pressure 120/81, pulse (!) 135, temperature 98.8 F (37.1 C), temperature source Oral, resp. rate 18, height 4' 8 (1.422 m), weight 109 lb 1.9 oz (49.5 kg), last menstrual period 08/18/2013, SpO2 96%.  Wt Readings from Last 3 Encounters:  07/03/24 109 lb 1.9 oz (49.5 kg)  07/03/24 109 lb 1.9 oz (49.5 kg)  06/12/24 119 lb 11.2 oz (54.3 kg)    Body mass index is 24.46 kg/m.  Performance status (ECOG): 3 - Symptomatic, >50% confined to bed  PHYSICAL EXAM:   Physical Exam Vitals and nursing note reviewed. Exam conducted with a chaperone present.  Constitutional:      General: Anna [name] is not in acute distress.    Appearance: Anna [name] is normal weight. Anna [name] is not ill-appearing.  HENT:     Head: Normocephalic and atraumatic.     Comments: Anna [name] has some firm swelling in the submental area    Right Ear: Tympanic membrane, ear canal and external ear normal.     Left Ear: Tympanic membrane, ear canal and external ear normal.     Nose: Nose normal.     Mouth/Throat:     Mouth: Mucous membranes are moist.     Pharynx: Oropharynx is clear. No oropharyngeal exudate or posterior oropharyngeal erythema.  Eyes:     General: No scleral icterus.    Extraocular Movements: Extraocular movements intact.     Conjunctiva/sclera: Conjunctivae normal.     Pupils: Pupils are equal, round, and reactive to light.  Cardiovascular:     Rate and Rhythm: Normal rate and regular rhythm.     Pulses: Normal pulses.     Heart sounds: Normal heart sounds. No murmur heard.    No friction rub. No gallop.  Pulmonary:     Effort: Pulmonary effort is normal.     Breath  sounds: Normal breath sounds. No wheezing, rhonchi or rales.  Chest:     Comments: Breast exam is deferred Abdominal:     General: Bowel sounds are normal. There is no distension.     Palpations: Abdomen is soft. There is no hepatomegaly, splenomegaly or mass.     Tenderness: There is no abdominal tenderness.  Musculoskeletal:        General: Normal range of motion.     Cervical back: Normal range of motion and neck supple. No tenderness.     Right lower leg: Edema (mild) present.     Left lower leg: Edema (mild) present.  Lymphadenopathy:     Cervical: No cervical adenopathy.     Upper Body:     Right upper body: No supraclavicular or axillary adenopathy.     Left upper body: No supraclavicular or axillary adenopathy.     Lower Body: No right inguinal adenopathy. No left inguinal adenopathy.  Skin:    General: Skin is warm and dry.     Coloration: Skin is not jaundiced.     Findings: No rash.  Neurological:     General: No focal deficit present.     Mental Status: Anna [name] is oriented to person, place, and time. Mental status is at baseline. Anna [name] is lethargic.     Cranial Nerves: Cranial nerves 2-12 are intact. No cranial nerve deficit.     Motor: Weakness present.  Psychiatric:        Mood and Affect: Mood normal.        Behavior: Behavior normal.        Thought Content: Thought content normal.        Judgment: Judgment normal.    LABS:      Latest Ref Rng & Units 07/03/2024    9:59 AM 07/01/2024    9:39 AM 06/29/2024   10:12 AM  CBC  WBC 4.0 - 10.5 K/uL 11.6  8.0  10.8   Hemoglobin 12.0 - 15.0 g/dL 9.6  8.7  89.9   Hematocrit 36.0 - 46.0 % 30.0  27.0  30.6   Platelets 150 - 400 K/uL 434  356  380       Latest Ref Rng & Units 07/03/2024    9:59 AM 07/01/2024    9:39 AM 06/29/2024   10:12 AM  CMP  Glucose 70 - 99 mg/dL 882  854  831   BUN 6 - 20 mg/dL 11  13  22    Creatinine 0.44 - 1.00 mg/dL 9.51  9.50  9.38   Sodium 135 - 145 mmol/L 139  133  132   Potassium 3.5  - 5.1 mmol/L 3.0  2.6  3.2   Chloride 98 - 111 mmol/L 94  92  89   CO2 22 - 32 mmol/L 31  32  32   Calcium  8.9 - 10.3 mg/dL 85.3  87.4  87.3   Total Protein 6.5 - 8.1 g/dL 5.7  4.7  5.5   Total Bilirubin 0.0 - 1.2 mg/dL 0.5  0.4  0.5   Alkaline Phos 38 - 126 U/L 226  188  211   AST 15 - 41 U/L 38  33  35   ALT 0 - 44 U/L 28  25  33    Lab Results  Component Value Date   CEA1 2.2 07/20/2021   /  CEA  Date Value Ref Range Status  07/20/2021 2.2 0.0 - 4.7 ng/mL Final    Comment:    (NOTE)  Nonsmokers          <3.9                             Smokers             <5.6 [employer] Electrochemiluminescence Immunoassay (ECLIA) Values obtained with different assay methods or kits cannot be used interchangeably.  Results cannot be interpreted as absolute evidence of the presence or absence of malignant disease. Performed At: [facility] [address] [place], [place] [id number] Jennette Shorter MD Ey:[id number]     Lab Results  Component Value Date   TIBC 276 04/16/2024   TIBC 269 01/24/2024   TIBC 251 10/25/2023   FERRITIN 282 04/16/2024   FERRITIN 326 (H) 01/24/2024   FERRITIN 129 10/25/2023   IRONPCTSAT 13 04/16/2024   IRONPCTSAT 13 01/24/2024   IRONPCTSAT 8 (L) 10/25/2023   Lab Results  Component Value Date   LDH 143 10/25/2023   LDH 171 07/10/2023    STUDIES:  EXAM: 07/01/2024 CT CHEST, ABDOMEN, AND PELVIS WITH CONTRAST IMPRESSION: 1. Interval enlarged 1.1 cm lymph node over the upper left subpectoral region just below the level of the clavicle suspicious for metastatic lymph node. 2. Stable extensive bony metastatic disease throughout the axial and appendicular skeleton with multiple stable compression fractures throughout the spine. Interval worsening lytic lesions over the pelvis with the largest over the right iliac bone adjacent the sacroiliac joint measuring 2.9 x 5.3 cm. 3. Stable 1 cm mass over the outer posterior  left breast compatible with known lumpectomy site in this patient with history of breast cancer diagnosed 2014. 4. Stable significant elevation of the left hemidiaphragm causing volume loss of the left lung. 5. Stable 12 cm simple lower pole right renal cyst. 6. Aortic atherosclerosis. Atherosclerotic coronary artery disease.  EXAM: 01/29/2024 CT CHEST, ABDOMEN, AND PELVIS WITH CONTRAST IMPRESSION: 1. New lytic metastasis of the right ilium abutting the sacroiliac joint. Slightly increased sclerotic character of multiple additional pelvic metastases. Findings evidence some progression of osseous metastatic disease although likely with some additional degree of sclerotic treatment response. 2. Otherwise unchanged widespread osseous metastatic disease including numerous unchanged pathologic wedge deformities throughout the thoracic and lumbar spine, including vertebral cement augmentation of T12 and L1. 3. Unchanged small mass in the lower outer left breast. 4. No evidence of lymphadenopathy or soft tissue metastatic disease in the chest, abdomen, or pelvis. 5. Coronary artery disease.   EXAM: 09/05/2023 CT CHEST, ABDOMEN, AND PELVIS WITH CONTRAST IMPRESSION: 1. Unchanged size of the mass in the left breast with overlying skin thickening. 2. No significant interval change in the widespread osseous metastatic disease involving the axial and proximal appendicular skeleton. 3. Similar soft tissue thickening about the left subclavian structures and thoracic inlet, possibly reflecting posttreatment change. Suggest continued attention on follow-up imaging. 4. No evidence of new or progressive disease within the chest, abdomen or pelvis. 5.  Aortic Atherosclerosis (ICD10-I70.0).  HISTORY:   Past Medical History:  Diagnosis Date   Achilles tendinitis of left lower extremity 04/22/2018   Acute peptic ulcer without hemorrhage and without perforation 11/29/2020   Acute sinusitis 11/29/2020    Anemia in neoplastic disease 03/26/2023   Anxiety    Bipolar disorder (HCC) 11/29/2020   Blood transfusion without reported diagnosis    Breast cancer (HCC)    Cancer (HCC)    Cardiac murmur    Chest pain    Chronic fatigue syndrome 11/29/2020  Depression    Essential hypertension 11/29/2020   Hepatic steatosis determined by biopsy of liver 07/04/2020   HLD (hyperlipidemia)    Hypertension    Hypomagnesemia 03/26/2023   Hypothyroidism 11/29/2020   Insomnia 11/29/2020   Iron  deficiency anemia 06/05/2022   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   Migraine 11/29/2020   Mild intermittent asthma 11/29/2020   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   Mixed hyperlipidemia 11/29/2020   Obesity due to excess calories    Osteopenia after menopause 07/04/2020   Other long term (current) drug therapy 11/29/2020   Other vitamin B12 deficiency anemias 11/29/2020   Personal history of malignant neoplasm of breast 11/29/2020   Pre-diabetes    Renal cyst, acquired, right 07/04/2020   14.5 cm in October 2021   Retrocalcaneal bursitis (back of heel), left 04/22/2018   Seasonal allergic rhinitis 11/29/2020   Tightness of heel cord, left 04/22/2018   Tremor 08/02/2023   Type 2 diabetes mellitus without complications (HCC) 11/29/2020   Vitamin D  deficiency 11/29/2020    Past Surgical History:  Procedure Laterality Date   CESAREAN SECTION     COLONOSCOPY     IR BONE TUMOR(S)RF ABLATION  05/29/2022   IR BONE TUMOR(S)RF ABLATION  05/29/2022   IR KYPHO EA ADDL LEVEL THORACIC OR LUMBAR  05/29/2022   IR KYPHO THORACIC WITH BONE BIOPSY  05/29/2022   IR RADIOLOGIST EVAL & MGMT  05/22/2022   IR RADIOLOGIST EVAL & MGMT  06/12/2022   LIVER BIOPSY  01/2019   lumpectomy  2014   Left Breast   PORT-A-CATH REMOVAL     PORTACATH PLACEMENT  08/28/2013   STOMACH SURGERY     Tummy Tuck   TOTAL HIP ARTHROPLASTY Right 07/04/2021   Procedure: TOTAL HIP  ARTHROPLASTY;  Surgeon: Ernie Cough, MD;  Location: [facility];  Service: Orthopedics;  Laterality: Right;  90   WISDOM TOOTH EXTRACTION      Family History  Problem Relation Age of Onset   Hyperlipidemia Mother    Diabetes Father    Stroke Father    Colon cancer Neg Hx    Colon polyps Neg Hx    Esophageal cancer Neg Hx    Rectal cancer Neg Hx    Stomach cancer Neg Hx     Social History:  reports that Anna [name] quit smoking about 21 years ago. Anna [name] smoking use included cigarettes. Anna [name] has never used smokeless tobacco. Anna [name] reports that Anna [name] does not currently use alcohol. Anna [name] reports that Anna [name] does not use drugs.The patient is accompanied by Anna [name] husband [name].  Allergies:  Allergies  Allergen Reactions   Xgeva  [Denosumab ] Other (See Comments)    Possible ONJ (09/13/23)   Reglan  [Metoclopramide ] Other (See Comments)    Made patient feel really funny when taking this medication     Current Medications: Current Outpatient Medications  Medication Sig Dispense Refill   nystatin  (MYCOSTATIN ) 100000 UNIT/ML suspension Take by mouth.     albuterol  (VENTOLIN  HFA) 108 (90 Base) MCG/ACT inhaler Inhale 2 puffs into the lungs every 6 (six) hours as needed for shortness of breath.     ARIPiprazole  (ABILIFY ) 5 MG tablet Take 2.5 mg by mouth daily.     aspirin 81 MG EC tablet Take 81 mg by mouth daily. Swallow whole.     atorvastatin  (LIPITOR) 20 MG tablet Take 1 tablet (20 mg total) by mouth daily. 90 tablet 3   augmented betamethasone dipropionate (DIPROLENE-AF) 0.05 % cream  (Patient taking  differently: 1 Application as needed.)     calcitonin (MIACALCIN ) 200 UNIT/ML injection Inject 1 mL (200 Units total) into the skin 2 (two) times daily. 14 mL 0   dapagliflozin  propanediol (FARXIGA ) 10 MG TABS tablet TAKE ONE TABLET BY MOUTH EVERY DAY 30 tablet 10   desvenlafaxine  (PRISTIQ ) 50 MG 24 hr tablet Take 50 mg by mouth daily.     dexamethasone  (DECADRON ) 4 MG tablet Take 1 tablet (4 mg total) by mouth  daily. 30 tablet 3   diphenoxylate -atropine  (LOMOTIL ) 2.5-0.025 MG tablet TAKE TWO TABLETS BY MOUTH FOUR TIMES A DAY AS NEEDED FOR DIARRHEA OR LOOSE STOOLS 100 tablet 0   fentaNYL  (DURAGESIC ) 50 MCG/HR Place 1 patch onto the skin every 3 (three) days. 5 patch 0   folic acid  (FOLVITE ) 1 MG tablet TAKE ONE TABLET BY MOUTH DAILY 30 tablet 5   labetalol  (NORMODYNE ) 100 MG tablet Take 0.5 tablets (50 mg total) by mouth 2 (two) times daily. 90 tablet 3   loratadine  (CLARITIN ) 10 MG tablet TAKE ONE TABLET BY MOUTH EVERY DAY 30 tablet 5   LORazepam  (ATIVAN ) 1 MG tablet Take 1 tablet (1 mg total) by mouth every 6 (six) hours as needed for anxiety (nausea). 60 tablet 0   magic mouthwash (lidocaine , diphenhydrAMINE , alum & mag hydroxide) suspension Take 5 mLs by mouth every 3 (three) hours as needed. Swish and spit; for throat/mouth pain     metroNIDAZOLE  (FLAGYL ) 250 MG tablet 1 tab qd and PRN - crush tab and apply topically to sacral wound bed for malodorous wound. 30 tablet 1   morphine  (ROXANOL) 20 MG/ML concentrated solution Take 1 mL (20 mg total) by mouth every 2 (two) hours as needed for severe pain (pain score 7-10). 30 mL 0   naloxone  (NARCAN ) nasal spray 4 mg/0.1 mL One spray in nostril as needed, may repeat every 2 to 3 minutes until medical assistance becomes available 4 each 1   nitroGLYCERIN  (NITROSTAT ) 0.4 MG SL tablet Place 0.4 mg under the tongue every 5 (five) minutes as needed for chest pain.     omeprazole  (PRILOSEC) 40 MG capsule Take 1 capsule (40 mg total) by mouth at bedtime. 30 capsule 5   ondansetron  (ZOFRAN ) 8 MG tablet Take 1 tablet (8 mg total) by mouth every 8 (eight) hours as needed for nausea or vomiting. 60 tablet 5   Oxycodone  HCl 10 MG TABS Take 1 tablet (10 mg total) by mouth every 4 (four) hours as needed. for pain 180 tablet 0   penicillin v potassium (VEETID) 500 MG tablet Take 500 mg by mouth 4 (four) times daily. (Patient taking differently: Take 500 mg by mouth 4 (four)  times daily. Finished one round, unsure if Anna [name] needs to continue or not at this time)     Potassium 99 MG TABS Take 2 tablets by mouth 2 (two) times daily. GUMMIES     potassium chloride  (KLOR-CON  M) 10 MEQ tablet Take 2 tablets (20 mEq total) by mouth 2 (two) times daily. 60 tablet 0   prochlorperazine  (COMPAZINE ) 10 MG tablet TAKE ONE TABLET BY MOUTH EVERY 6 HOURS AS NEEDED FOR NAUSEA/VOMITING 90 tablet 3   Vibegron (GEMTESA) 75 MG TABS Take 1 tablet by mouth daily.     Current Facility-Administered Medications  Medication Dose Route Frequency Provider Last Rate Last Admin   technetium sestamibi generic (CARDIOLITE ) injection 10.5 millicurie  10.5 millicurie Intravenous Once PRN Revankar, Rajan R, MD       Facility-Administered Medications  Ordered in Other Visits  Medication Dose Route Frequency Provider Last Rate Last Admin   heparin  lock flush 100 unit/mL  500 Units Intracatheter Once PRN Harl Setter A, NP       sodium chloride  flush (NS) 0.9 % injection 10 mL  10 mL Intracatheter PRN Mosher, Kelli A, PA-C   10 mL at 11/15/23 1111   sodium chloride  flush (NS) 0.9 % injection 10 mL  10 mL Intracatheter PRN Mosher, Kelli A, PA-C       sodium chloride  flush (NS) 0.9 % injection 10 mL  10 mL Intravenous PRN Cornelius Wanda DEL, MD   10 mL at 12/06/23 1109   sodium chloride  flush 0.9 % injection 10 mL  10 mL Intravenous PRN Cornelius Wanda DEL, MD   10 mL at 11/05/23 9089    I,Patsy Varma H Doyt Castellana,acting as a scribe for Wanda DEL Cornelius, MD.,have documented all relevant documentation on the behalf of Wanda DEL Cornelius, MD,as directed by  Wanda DEL Cornelius, MD while in the presence of Wanda DEL Cornelius, MD.  I have reviewed this report as typed by the medical scribe, and it is complete and accurate.

## 2024-07-03 ENCOUNTER — Inpatient Hospital Stay

## 2024-07-03 ENCOUNTER — Other Ambulatory Visit: Payer: Self-pay | Admitting: Hematology and Oncology

## 2024-07-03 ENCOUNTER — Inpatient Hospital Stay: Admitting: Oncology

## 2024-07-03 ENCOUNTER — Inpatient Hospital Stay (HOSPITAL_BASED_OUTPATIENT_CLINIC_OR_DEPARTMENT_OTHER): Admitting: Oncology

## 2024-07-03 ENCOUNTER — Encounter: Payer: Self-pay | Admitting: Oncology

## 2024-07-03 VITALS — BP 120/81 | HR 135 | Temp 98.8°F | Resp 18 | Ht <= 58 in | Wt 109.1 lb

## 2024-07-03 DIAGNOSIS — Z170 Estrogen receptor positive status [ER+]: Secondary | ICD-10-CM

## 2024-07-03 DIAGNOSIS — C7951 Secondary malignant neoplasm of bone: Secondary | ICD-10-CM

## 2024-07-03 DIAGNOSIS — C50412 Malignant neoplasm of upper-outer quadrant of left female breast: Secondary | ICD-10-CM

## 2024-07-03 LAB — CBC WITH DIFFERENTIAL (CANCER CENTER ONLY)
Abs Immature Granulocytes: 0.24 K/uL — ABNORMAL HIGH (ref 0.00–0.07)
Basophils Absolute: 0 K/uL (ref 0.0–0.1)
Basophils Relative: 0 %
Eosinophils Absolute: 0.1 K/uL (ref 0.0–0.5)
Eosinophils Relative: 1 %
HCT: 30 % — ABNORMAL LOW (ref 36.0–46.0)
Hemoglobin: 9.6 g/dL — ABNORMAL LOW (ref 12.0–15.0)
Immature Granulocytes: 2 %
Lymphocytes Relative: 4 %
Lymphs Abs: 0.5 K/uL — ABNORMAL LOW (ref 0.7–4.0)
MCH: 26 pg (ref 26.0–34.0)
MCHC: 32 g/dL (ref 30.0–36.0)
MCV: 81.3 fL (ref 80.0–100.0)
Monocytes Absolute: 1 K/uL (ref 0.1–1.0)
Monocytes Relative: 9 %
Neutro Abs: 9.8 K/uL — ABNORMAL HIGH (ref 1.7–7.7)
Neutrophils Relative %: 84 %
Platelet Count: 434 K/uL — ABNORMAL HIGH (ref 150–400)
RBC: 3.69 MIL/uL — ABNORMAL LOW (ref 3.87–5.11)
RDW: 18.3 % — ABNORMAL HIGH (ref 11.5–15.5)
WBC Count: 11.6 K/uL — ABNORMAL HIGH (ref 4.0–10.5)
nRBC: 0.3 % — ABNORMAL HIGH (ref 0.0–0.2)

## 2024-07-03 LAB — CMP (CANCER CENTER ONLY)
ALT: 28 U/L (ref 0–44)
AST: 38 U/L (ref 15–41)
Albumin: 2.9 g/dL — ABNORMAL LOW (ref 3.5–5.0)
Alkaline Phosphatase: 226 U/L — ABNORMAL HIGH (ref 38–126)
Anion gap: 14 (ref 5–15)
BUN: 11 mg/dL (ref 6–20)
CO2: 31 mmol/L (ref 22–32)
Calcium: 14.6 mg/dL (ref 8.9–10.3)
Chloride: 94 mmol/L — ABNORMAL LOW (ref 98–111)
Creatinine: 0.48 mg/dL (ref 0.44–1.00)
GFR, Estimated: >60 mL/min (ref >60)
Glucose, Bld: 117 mg/dL — ABNORMAL HIGH (ref 70–99)
Potassium: 3 mmol/L — ABNORMAL LOW (ref 3.5–5.1)
Sodium: 139 mmol/L (ref 135–145)
Total Bilirubin: 0.5 mg/dL (ref 0.0–1.2)
Total Protein: 5.7 g/dL — ABNORMAL LOW (ref 6.5–8.1)

## 2024-07-03 LAB — MAGNESIUM: Magnesium: 1.5 mg/dL — ABNORMAL LOW (ref 1.7–2.4)

## 2024-07-03 MED ORDER — FENTANYL 50 MCG/HR TD PT72: 1.0000 | MEDICATED_PATCH | TRANSDERMAL | 0 refills | Status: DC

## 2024-07-03 MED ORDER — MORPHINE SULFATE (CONCENTRATE) 20 MG/ML PO SOLN: 20.0000 mg | ORAL | 0 refills | Status: DC | PRN

## 2024-07-03 MED ORDER — HYDROMORPHONE HCL 1 MG/ML IJ SOLN
1.0000 mg | Freq: Every day | INTRAMUSCULAR | Status: AC | PRN
  Administered 2024-07-03: 1 mg via INTRAVENOUS
  Filled 2024-07-03: qty 1

## 2024-07-03 NOTE — Patient Instructions (Signed)
 Hospice You may need hospice if you're very sick with something that will end your life. Hospice care can give you and your family medical, emotional, and spiritual support. The goal is to help keep you as comfortable as possible in the last stages of life. Who makes up a hospice care team? Your hospice care team may include: You and your family. A nurse and a hospice doctor. Your health care provider may also be part of the team. A social worker or case Production designer, theatre/television/film. A religious or spiritual leader. Specialists. These may include: An expert in healthy eating called a dietitian. A mental health counselor. Physical therapists. Occupational therapists. A grief counselor. Trained volunteers who can help with care. What services are part of hospice care? Hospice services can vary. They may include: Ways to keep you comfortable. These ways may include: Caring for you in your home or in a home-like setting. Giving you relief from pain and symptoms. You'll be kept comfortable and alert enough to enjoy being with friends and family. Working with your loved ones to help them do most of your basic care. Visits or care from a nurse and doctor. Care may include 24-hour on-call services. Visits from specialists. Their services may include: Counseling. Massage therapy. Nutrition therapy. Physical and occupational therapy. Art or music therapy. Spiritual care. This may involve: Helping you and your family understand the dying process. Helping you say goodbye to your family and friends. A religious ceremony or ritual. Someone to stay with you when you're alone. Letting you and your family rest. Hospice staff may do light work around the house, make meals, and run errands. Short-term care in a health care setting, if something can't be managed at home. Grief support for family members. When should hospice care start? Your family and providers can help you decide when hospice care should start. You may  need hospice if: You're thought to have 6 months or less to live. If you live longer than 6 months but aren't getting better, you may be able to keep getting hospice care. If you get better, you may drop out of the program. You no longer can, or want to, try to find a cure for your illness. What should I consider before selecting a program? Most hospice programs are run by outside groups, such as non-profits. Some may be linked to hospitals, nursing homes, or home health care agencies. These programs can take place at: Home. A hospice center. A hospital. A skilled nursing facility. When choosing a hospice program, ask: What services are there for me and my loved ones? What does the program cost? Will insurance pay for it? Is the program licensed by the state or certified in some other way? How will my pain and symptoms be managed? What sort of spiritual and emotional support will you provide? If I choose a hospice center or nursing home, where is it located? Is it somewhere my family and friends can get to easily? If I choose a hospice center or nursing home, will my family and friends be able to visit any time? If needed, can the setting for the services change? Who makes up the hospice care team? How are they trained or screened? How involved can my loved ones be? Whom can my family call with questions? What role does my provider have? Where can I learn more about hospice? You can learn about hospice programs in your area from your provider. These websites may be helpful: [facility] (  [facility]): https://rodriguez-phillips.com/ National Association for [facility] H[facility]): https://smith-davis.net/ [facility] [employer] [facility] [place]: hospicefoundation.org American Cancer Society: cancer.org eHospice: ehospice.com You may also want to talk with: A local agency on aging. Your local chapter of [employer]. Your state's department of health or social services. This information is not  intended to replace advice given to you by your health care provider. Make sure you discuss any questions you have with your health care provider. Document Revised: 12/17/2022 Document Reviewed: 12/17/2022 Elsevier Patient Education  2024 [employer].

## 2024-07-03 NOTE — Progress Notes (Signed)
 CRITICAL VALUE STICKER  CRITICAL VALUE:  Ca+ 14.6  RECEIVER (on-site recipient of call):  Woodie Kapur RN  DATE & TIME NOTIFIED:   07/03/2024 @ 1046  MESSENGER (representative from lab):  [facility]  MD NOTIFIED:   Dr. Cornelius  TIME OF NOTIFICATION:  1122  RESPONSE:   Scheduled to see patient.

## 2024-07-06 ENCOUNTER — Inpatient Hospital Stay

## 2024-07-07 ENCOUNTER — Telehealth: Payer: Self-pay

## 2024-07-07 DIAGNOSIS — Z515 Encounter for palliative care: Secondary | ICD-10-CM

## 2024-07-07 DIAGNOSIS — L89300 Pressure ulcer of unspecified buttock, unstageable: Secondary | ICD-10-CM

## 2024-07-07 NOTE — Telephone Encounter (Signed)
 Anna [name], Hospice nurse, reports that pt has developed a foul odor pressure ulcer on her sacrum. The wound is unstageable. She states Hospice often use flagyl  250mg  tab qd and PRN - crush tab and apply topically to wound bed to help with odor.

## 2024-07-08 ENCOUNTER — Other Ambulatory Visit (HOSPITAL_BASED_OUTPATIENT_CLINIC_OR_DEPARTMENT_OTHER): Admitting: Radiology

## 2024-07-08 ENCOUNTER — Other Ambulatory Visit: Payer: Self-pay

## 2024-07-08 ENCOUNTER — Inpatient Hospital Stay

## 2024-07-08 ENCOUNTER — Encounter: Payer: Self-pay | Admitting: Oncology

## 2024-07-08 MED ORDER — METRONIDAZOLE 250 MG PO TABS: ORAL_TABLET | ORAL | 1 refills | Status: DC

## 2024-07-13 ENCOUNTER — Ambulatory Visit: Admitting: Hematology and Oncology

## 2024-07-13 ENCOUNTER — Ambulatory Visit

## 2024-07-13 ENCOUNTER — Other Ambulatory Visit

## 2024-07-13 DEATH — deceased

## 2024-07-14 ENCOUNTER — Other Ambulatory Visit: Payer: Self-pay | Admitting: Oncology

## 2024-07-14 ENCOUNTER — Inpatient Hospital Stay

## 2024-07-14 ENCOUNTER — Encounter: Payer: Self-pay | Admitting: Oncology

## 2024-07-14 ENCOUNTER — Inpatient Hospital Stay: Admitting: Hematology and Oncology

## 2024-07-14 DIAGNOSIS — C7951 Secondary malignant neoplasm of bone: Secondary | ICD-10-CM

## 2024-07-22 ENCOUNTER — Ambulatory Visit: Admitting: Podiatry

## 2024-08-10 ENCOUNTER — Ambulatory Visit

## 2024-08-10 ENCOUNTER — Ambulatory Visit: Admitting: Hematology and Oncology

## 2024-08-10 ENCOUNTER — Other Ambulatory Visit
